# Patient Record
Sex: Female | Born: 1935 | Race: White | Hispanic: No | Smoking: Former smoker
Health system: Southern US, Community
[De-identification: ages and names within clinical notes are randomized; demographics above are authoritative.]

## PROBLEM LIST (undated history)

## (undated) DIAGNOSIS — N390 Urinary tract infection, site not specified: Secondary | ICD-10-CM

## (undated) DIAGNOSIS — N135 Crossing vessel and stricture of ureter without hydronephrosis: Secondary | ICD-10-CM

## (undated) DIAGNOSIS — M1990 Unspecified osteoarthritis, unspecified site: Secondary | ICD-10-CM

## (undated) DIAGNOSIS — I340 Nonrheumatic mitral (valve) insufficiency: Secondary | ICD-10-CM

## (undated) DIAGNOSIS — B029 Zoster without complications: Secondary | ICD-10-CM

## (undated) DIAGNOSIS — K219 Gastro-esophageal reflux disease without esophagitis: Secondary | ICD-10-CM

## (undated) DIAGNOSIS — N9489 Other specified conditions associated with female genital organs and menstrual cycle: Secondary | ICD-10-CM

## (undated) DIAGNOSIS — R0600 Dyspnea, unspecified: Secondary | ICD-10-CM

## (undated) DIAGNOSIS — R112 Nausea with vomiting, unspecified: Secondary | ICD-10-CM

## (undated) DIAGNOSIS — Z789 Other specified health status: Secondary | ICD-10-CM

## (undated) DIAGNOSIS — K449 Diaphragmatic hernia without obstruction or gangrene: Secondary | ICD-10-CM

## (undated) DIAGNOSIS — Z9221 Personal history of antineoplastic chemotherapy: Secondary | ICD-10-CM

## (undated) DIAGNOSIS — Z87442 Personal history of urinary calculi: Secondary | ICD-10-CM

## (undated) DIAGNOSIS — H9190 Unspecified hearing loss, unspecified ear: Secondary | ICD-10-CM

## (undated) DIAGNOSIS — C8330 Diffuse large B-cell lymphoma, unspecified site: Secondary | ICD-10-CM

## (undated) DIAGNOSIS — I509 Heart failure, unspecified: Secondary | ICD-10-CM

## (undated) DIAGNOSIS — L570 Actinic keratosis: Secondary | ICD-10-CM

## (undated) DIAGNOSIS — E039 Hypothyroidism, unspecified: Secondary | ICD-10-CM

## (undated) DIAGNOSIS — C8590 Non-Hodgkin lymphoma, unspecified, unspecified site: Secondary | ICD-10-CM

## (undated) DIAGNOSIS — Z66 Do not resuscitate: Secondary | ICD-10-CM

## (undated) DIAGNOSIS — R55 Syncope and collapse: Secondary | ICD-10-CM

## (undated) DIAGNOSIS — I2510 Atherosclerotic heart disease of native coronary artery without angina pectoris: Secondary | ICD-10-CM

## (undated) DIAGNOSIS — T8859XA Other complications of anesthesia, initial encounter: Secondary | ICD-10-CM

## (undated) DIAGNOSIS — J449 Chronic obstructive pulmonary disease, unspecified: Secondary | ICD-10-CM

## (undated) DIAGNOSIS — N189 Chronic kidney disease, unspecified: Secondary | ICD-10-CM

## (undated) DIAGNOSIS — R251 Tremor, unspecified: Secondary | ICD-10-CM

## (undated) DIAGNOSIS — T4145XA Adverse effect of unspecified anesthetic, initial encounter: Secondary | ICD-10-CM

## (undated) DIAGNOSIS — Z8719 Personal history of other diseases of the digestive system: Secondary | ICD-10-CM

## (undated) DIAGNOSIS — F0390 Unspecified dementia without behavioral disturbance: Secondary | ICD-10-CM

## (undated) DIAGNOSIS — Z9889 Other specified postprocedural states: Secondary | ICD-10-CM

## (undated) HISTORY — DX: Other specified conditions associated with female genital organs and menstrual cycle: N94.89

## (undated) HISTORY — DX: Gastro-esophageal reflux disease without esophagitis: K21.9

## (undated) HISTORY — DX: Urinary tract infection, site not specified: N39.0

## (undated) HISTORY — DX: Unspecified osteoarthritis, unspecified site: M19.90

## (undated) HISTORY — DX: Zoster without complications: B02.9

## (undated) HISTORY — PX: JOINT REPLACEMENT: SHX530

## (undated) HISTORY — DX: Diffuse large B-cell lymphoma, unspecified site: C83.30

## (undated) HISTORY — DX: Actinic keratosis: L57.0

## (undated) HISTORY — PX: DENTAL SURGERY: SHX609

## (undated) HISTORY — PX: TOTAL KNEE ARTHROPLASTY: SHX125

## (undated) HISTORY — PX: SQUAMOUS CELL CARCINOMA EXCISION: SHX2433

---

## 1969-01-28 HISTORY — PX: VAGINAL HYSTERECTOMY: SUR661

## 1993-01-28 HISTORY — PX: OTHER SURGICAL HISTORY: SHX169

## 1995-01-29 HISTORY — PX: CATARACT EXTRACTION: SUR2

## 1995-01-29 HISTORY — PX: SPINE SURGERY: SHX786

## 2007-01-29 DIAGNOSIS — C8330 Diffuse large B-cell lymphoma, unspecified site: Secondary | ICD-10-CM

## 2007-01-29 DIAGNOSIS — C833A Diffuse large b-cell lymphoma, in remission: Secondary | ICD-10-CM

## 2007-01-29 HISTORY — DX: Diffuse large B-cell lymphoma, unspecified site: C83.30

## 2007-01-29 HISTORY — PX: ABDOMINAL SURGERY: SHX537

## 2007-01-29 HISTORY — PX: PORTACATH PLACEMENT: SHX2246

## 2007-01-29 HISTORY — DX: Diffuse large b-cell lymphoma, in remission: C83.3A

## 2007-03-01 ENCOUNTER — Ambulatory Visit: Payer: Self-pay | Admitting: Oncology

## 2007-03-02 HISTORY — PX: OTHER SURGICAL HISTORY: SHX169

## 2007-03-11 ENCOUNTER — Ambulatory Visit: Payer: Self-pay | Admitting: Oncology

## 2007-03-13 ENCOUNTER — Other Ambulatory Visit: Payer: Self-pay

## 2007-03-29 ENCOUNTER — Ambulatory Visit: Payer: Self-pay | Admitting: Oncology

## 2007-04-29 ENCOUNTER — Ambulatory Visit: Payer: Self-pay | Admitting: Oncology

## 2007-05-29 ENCOUNTER — Ambulatory Visit: Payer: Self-pay | Admitting: Oncology

## 2007-06-29 ENCOUNTER — Ambulatory Visit: Payer: Self-pay | Admitting: Oncology

## 2007-07-29 ENCOUNTER — Ambulatory Visit: Payer: Self-pay | Admitting: Oncology

## 2007-07-30 ENCOUNTER — Ambulatory Visit: Payer: Self-pay | Admitting: Oncology

## 2007-08-29 ENCOUNTER — Ambulatory Visit: Payer: Self-pay | Admitting: Oncology

## 2007-09-21 ENCOUNTER — Ambulatory Visit: Payer: Self-pay | Admitting: General Practice

## 2007-09-29 ENCOUNTER — Ambulatory Visit: Payer: Self-pay | Admitting: Oncology

## 2007-10-08 ENCOUNTER — Inpatient Hospital Stay: Payer: Self-pay | Admitting: General Practice

## 2007-10-14 ENCOUNTER — Encounter: Payer: Self-pay | Admitting: Internal Medicine

## 2007-10-26 ENCOUNTER — Encounter: Payer: Self-pay | Admitting: General Practice

## 2007-10-28 ENCOUNTER — Ambulatory Visit: Payer: Self-pay | Admitting: Oncology

## 2007-10-29 ENCOUNTER — Encounter: Payer: Self-pay | Admitting: General Practice

## 2007-10-29 ENCOUNTER — Ambulatory Visit: Payer: Self-pay | Admitting: Oncology

## 2007-11-29 ENCOUNTER — Encounter: Payer: Self-pay | Admitting: General Practice

## 2007-12-30 ENCOUNTER — Encounter: Payer: Self-pay | Admitting: General Practice

## 2008-01-27 ENCOUNTER — Ambulatory Visit: Payer: Self-pay | Admitting: Oncology

## 2008-01-29 ENCOUNTER — Ambulatory Visit: Payer: Self-pay | Admitting: Oncology

## 2008-01-29 ENCOUNTER — Encounter: Payer: Self-pay | Admitting: General Practice

## 2008-02-01 ENCOUNTER — Ambulatory Visit: Payer: Self-pay | Admitting: Oncology

## 2008-02-29 ENCOUNTER — Ambulatory Visit: Payer: Self-pay | Admitting: Oncology

## 2008-03-11 DIAGNOSIS — C8599 Non-Hodgkin lymphoma, unspecified, extranodal and solid organ sites: Secondary | ICD-10-CM | POA: Insufficient documentation

## 2008-03-11 DIAGNOSIS — Z87891 Personal history of nicotine dependence: Secondary | ICD-10-CM | POA: Insufficient documentation

## 2008-04-28 ENCOUNTER — Ambulatory Visit: Payer: Self-pay | Admitting: Oncology

## 2008-05-04 ENCOUNTER — Ambulatory Visit: Payer: Self-pay | Admitting: Oncology

## 2008-05-28 ENCOUNTER — Ambulatory Visit: Payer: Self-pay | Admitting: Oncology

## 2008-08-28 ENCOUNTER — Ambulatory Visit: Payer: Self-pay | Admitting: Oncology

## 2008-09-05 ENCOUNTER — Ambulatory Visit: Payer: Self-pay | Admitting: Oncology

## 2008-09-09 ENCOUNTER — Ambulatory Visit: Payer: Self-pay | Admitting: Oncology

## 2008-09-28 ENCOUNTER — Ambulatory Visit: Payer: Self-pay | Admitting: Oncology

## 2008-10-17 ENCOUNTER — Ambulatory Visit: Payer: Self-pay | Admitting: Family Medicine

## 2008-11-09 DIAGNOSIS — C4492 Squamous cell carcinoma of skin, unspecified: Secondary | ICD-10-CM

## 2008-11-09 HISTORY — DX: Squamous cell carcinoma of skin, unspecified: C44.92

## 2009-01-28 ENCOUNTER — Ambulatory Visit: Payer: Self-pay | Admitting: Oncology

## 2009-01-31 DIAGNOSIS — B029 Zoster without complications: Secondary | ICD-10-CM

## 2009-01-31 HISTORY — DX: Zoster without complications: B02.9

## 2009-02-24 ENCOUNTER — Ambulatory Visit: Payer: Self-pay | Admitting: Oncology

## 2009-02-28 ENCOUNTER — Ambulatory Visit: Payer: Self-pay | Admitting: Oncology

## 2009-05-08 ENCOUNTER — Ambulatory Visit: Payer: Self-pay | Admitting: Family Medicine

## 2009-05-26 ENCOUNTER — Ambulatory Visit: Payer: Self-pay | Admitting: Oncology

## 2009-05-28 ENCOUNTER — Ambulatory Visit: Payer: Self-pay | Admitting: Oncology

## 2009-07-28 ENCOUNTER — Ambulatory Visit: Payer: Self-pay | Admitting: Ophthalmology

## 2009-08-01 ENCOUNTER — Ambulatory Visit: Payer: Self-pay | Admitting: Ophthalmology

## 2009-10-19 ENCOUNTER — Ambulatory Visit: Payer: Self-pay | Admitting: Oncology

## 2009-10-25 ENCOUNTER — Ambulatory Visit: Payer: Self-pay | Admitting: Oncology

## 2009-10-28 ENCOUNTER — Ambulatory Visit: Payer: Self-pay | Admitting: Oncology

## 2009-10-30 ENCOUNTER — Ambulatory Visit: Payer: Self-pay | Admitting: Oncology

## 2009-11-09 LAB — CA 125: CA 125: 11.9 U/mL (ref 0.0–34.0)

## 2009-11-28 ENCOUNTER — Ambulatory Visit: Payer: Self-pay | Admitting: Oncology

## 2010-05-16 ENCOUNTER — Ambulatory Visit: Payer: Self-pay | Admitting: Family Medicine

## 2010-06-05 ENCOUNTER — Ambulatory Visit: Payer: Self-pay | Admitting: Oncology

## 2010-06-29 ENCOUNTER — Ambulatory Visit: Payer: Self-pay | Admitting: Oncology

## 2010-10-08 ENCOUNTER — Ambulatory Visit: Payer: Self-pay | Admitting: Oncology

## 2010-10-11 ENCOUNTER — Ambulatory Visit: Payer: Self-pay | Admitting: Oncology

## 2010-10-29 ENCOUNTER — Ambulatory Visit: Payer: Self-pay | Admitting: Oncology

## 2010-11-13 ENCOUNTER — Ambulatory Visit: Payer: Self-pay | Admitting: Family Medicine

## 2010-12-13 ENCOUNTER — Ambulatory Visit: Payer: Self-pay | Admitting: Family Medicine

## 2011-02-26 ENCOUNTER — Ambulatory Visit: Payer: Self-pay | Admitting: Oncology

## 2011-02-27 LAB — CA 125: CA 125: 12.4 U/mL (ref 0.0–34.0)

## 2011-03-01 ENCOUNTER — Ambulatory Visit: Payer: Self-pay | Admitting: Oncology

## 2011-03-19 DIAGNOSIS — M1990 Unspecified osteoarthritis, unspecified site: Secondary | ICD-10-CM | POA: Insufficient documentation

## 2011-03-29 ENCOUNTER — Ambulatory Visit: Payer: Self-pay | Admitting: Oncology

## 2011-04-11 LAB — CBC CANCER CENTER
Basophil #: 0.1 x10 3/mm (ref 0.0–0.1)
Basophil %: 0.9 %
Eosinophil #: 0.4 x10 3/mm (ref 0.0–0.7)
Eosinophil %: 6.7 %
HCT: 40.7 % (ref 35.0–47.0)
HGB: 13.5 g/dL (ref 12.0–16.0)
Lymphocyte #: 1.3 x10 3/mm (ref 1.0–3.6)
Lymphocyte %: 22.2 %
MCH: 30.9 pg (ref 26.0–34.0)
MCHC: 33.2 g/dL (ref 32.0–36.0)
MCV: 93 fL (ref 80–100)
Monocyte #: 0.5 x10 3/mm (ref 0.0–0.7)
Monocyte %: 8.8 %
Neutrophil #: 3.6 x10 3/mm (ref 1.4–6.5)
Neutrophil %: 61.4 %
Platelet: 298 x10 3/mm (ref 150–440)
RBC: 4.37 10*6/uL (ref 3.80–5.20)
RDW: 12 % (ref 11.5–14.5)
WBC: 5.9 x10 3/mm (ref 3.6–11.0)

## 2011-04-11 LAB — COMPREHENSIVE METABOLIC PANEL
Albumin: 4 g/dL (ref 3.4–5.0)
Alkaline Phosphatase: 83 U/L (ref 50–136)
Anion Gap: 7 (ref 7–16)
BUN: 12 mg/dL (ref 7–18)
Bilirubin,Total: 0.6 mg/dL (ref 0.2–1.0)
Calcium, Total: 8.7 mg/dL (ref 8.5–10.1)
Chloride: 100 mmol/L (ref 98–107)
Co2: 30 mmol/L (ref 21–32)
Creatinine: 0.85 mg/dL (ref 0.60–1.30)
EGFR (African American): >60
EGFR (Non-African Amer.): >60
Glucose: 93 mg/dL (ref 65–99)
Osmolality: 273 (ref 275–301)
Potassium: 4.1 mmol/L (ref 3.5–5.1)
SGOT(AST): 23 U/L (ref 15–37)
SGPT (ALT): 26 U/L
Sodium: 137 mmol/L (ref 136–145)
Total Protein: 7.4 g/dL (ref 6.4–8.2)

## 2011-04-11 LAB — LACTATE DEHYDROGENASE: LDH: 137 U/L (ref 84–246)

## 2011-04-12 LAB — CA 125: CA 125: 13 U/mL (ref 0.0–34.0)

## 2011-04-29 ENCOUNTER — Ambulatory Visit: Payer: Self-pay | Admitting: Oncology

## 2011-09-03 ENCOUNTER — Ambulatory Visit: Payer: Self-pay | Admitting: Gynecologic Oncology

## 2011-10-08 ENCOUNTER — Ambulatory Visit: Payer: Self-pay | Admitting: Oncology

## 2011-10-14 LAB — COMPREHENSIVE METABOLIC PANEL
Albumin: 4.1 g/dL (ref 3.4–5.0)
Alkaline Phosphatase: 80 U/L (ref 50–136)
Anion Gap: 4 — ABNORMAL LOW (ref 7–16)
BUN: 9 mg/dL (ref 7–18)
Bilirubin,Total: 0.5 mg/dL (ref 0.2–1.0)
Calcium, Total: 8.8 mg/dL (ref 8.5–10.1)
Chloride: 99 mmol/L (ref 98–107)
Co2: 33 mmol/L — ABNORMAL HIGH (ref 21–32)
Creatinine: 0.93 mg/dL (ref 0.60–1.30)
EGFR (African American): >60
EGFR (Non-African Amer.): 58 — ABNORMAL LOW
Glucose: 102 mg/dL — ABNORMAL HIGH (ref 65–99)
Osmolality: 271 (ref 275–301)
Potassium: 4.2 mmol/L (ref 3.5–5.1)
SGOT(AST): 19 U/L (ref 15–37)
SGPT (ALT): 23 U/L (ref 12–78)
Sodium: 136 mmol/L (ref 136–145)
Total Protein: 7.3 g/dL (ref 6.4–8.2)

## 2011-10-14 LAB — CBC CANCER CENTER
Basophil #: 0.1 x10 3/mm (ref 0.0–0.1)
Basophil %: 1 %
Eosinophil #: 0.3 x10 3/mm (ref 0.0–0.7)
Eosinophil %: 3.1 %
HCT: 40.5 % (ref 35.0–47.0)
HGB: 13.2 g/dL (ref 12.0–16.0)
Lymphocyte #: 1.3 x10 3/mm (ref 1.0–3.6)
Lymphocyte %: 14 %
MCH: 31.1 pg (ref 26.0–34.0)
MCHC: 32.7 g/dL (ref 32.0–36.0)
MCV: 95 fL (ref 80–100)
Monocyte #: 0.6 x10 3/mm (ref 0.2–0.9)
Monocyte %: 6.8 %
Neutrophil #: 7.1 x10 3/mm — ABNORMAL HIGH (ref 1.4–6.5)
Neutrophil %: 75.1 %
Platelet: 293 x10 3/mm (ref 150–440)
RBC: 4.26 10*6/uL (ref 3.80–5.20)
RDW: 12.6 % (ref 11.5–14.5)
WBC: 9.4 x10 3/mm (ref 3.6–11.0)

## 2011-10-14 LAB — LACTATE DEHYDROGENASE: LDH: 158 U/L (ref 81–234)

## 2011-10-15 ENCOUNTER — Encounter: Payer: Self-pay | Admitting: Unknown Physician Specialty

## 2011-10-15 LAB — CA 125: CA 125: 11.3 U/mL (ref 0.0–34.0)

## 2011-10-29 ENCOUNTER — Encounter: Payer: Self-pay | Admitting: Unknown Physician Specialty

## 2011-10-29 ENCOUNTER — Ambulatory Visit: Payer: Self-pay | Admitting: Oncology

## 2011-11-29 ENCOUNTER — Encounter: Payer: Self-pay | Admitting: Unknown Physician Specialty

## 2012-01-01 ENCOUNTER — Ambulatory Visit: Payer: Self-pay | Admitting: Family Medicine

## 2012-04-10 ENCOUNTER — Ambulatory Visit: Payer: Self-pay | Admitting: Oncology

## 2012-04-13 LAB — CBC CANCER CENTER
Basophil #: 0.1 x10 3/mm (ref 0.0–0.1)
Basophil %: 0.8 %
Eosinophil #: 0.3 x10 3/mm (ref 0.0–0.7)
Eosinophil %: 4.1 %
HCT: 44.4 % (ref 35.0–47.0)
HGB: 15 g/dL (ref 12.0–16.0)
Lymphocyte #: 1.5 x10 3/mm (ref 1.0–3.6)
Lymphocyte %: 19.5 %
MCH: 31.5 pg (ref 26.0–34.0)
MCHC: 33.8 g/dL (ref 32.0–36.0)
MCV: 93 fL (ref 80–100)
Monocyte #: 0.6 x10 3/mm (ref 0.2–0.9)
Monocyte %: 7.8 %
Neutrophil #: 5.2 x10 3/mm (ref 1.4–6.5)
Neutrophil %: 67.8 %
Platelet: 296 x10 3/mm (ref 150–440)
RBC: 4.78 10*6/uL (ref 3.80–5.20)
RDW: 13 % (ref 11.5–14.5)
WBC: 7.7 x10 3/mm (ref 3.6–11.0)

## 2012-04-13 LAB — COMPREHENSIVE METABOLIC PANEL
Albumin: 4.2 g/dL (ref 3.4–5.0)
Alkaline Phosphatase: 82 U/L (ref 50–136)
Anion Gap: 9 (ref 7–16)
BUN: 17 mg/dL (ref 7–18)
Bilirubin,Total: 0.4 mg/dL (ref 0.2–1.0)
Calcium, Total: 9.5 mg/dL (ref 8.5–10.1)
Chloride: 96 mmol/L — ABNORMAL LOW (ref 98–107)
Co2: 32 mmol/L (ref 21–32)
Creatinine: 0.93 mg/dL (ref 0.60–1.30)
EGFR (African American): >60
EGFR (Non-African Amer.): 58 — ABNORMAL LOW
Glucose: 116 mg/dL — ABNORMAL HIGH (ref 65–99)
Osmolality: 276 (ref 275–301)
Potassium: 3.5 mmol/L (ref 3.5–5.1)
SGOT(AST): 13 U/L — ABNORMAL LOW (ref 15–37)
SGPT (ALT): 23 U/L (ref 12–78)
Sodium: 137 mmol/L (ref 136–145)
Total Protein: 7.8 g/dL (ref 6.4–8.2)

## 2012-04-28 ENCOUNTER — Ambulatory Visit: Payer: Self-pay | Admitting: Oncology

## 2012-10-08 ENCOUNTER — Ambulatory Visit: Payer: Self-pay | Admitting: Gynecologic Oncology

## 2012-10-12 ENCOUNTER — Ambulatory Visit: Payer: Self-pay | Admitting: Oncology

## 2012-10-28 ENCOUNTER — Ambulatory Visit: Payer: Self-pay | Admitting: Oncology

## 2013-01-01 ENCOUNTER — Ambulatory Visit: Payer: Self-pay | Admitting: Family Medicine

## 2013-01-09 ENCOUNTER — Observation Stay: Payer: Self-pay | Admitting: Internal Medicine

## 2013-01-09 LAB — URINALYSIS, COMPLETE
Bilirubin,UR: NEGATIVE
Blood: NEGATIVE
Glucose,UR: NEGATIVE mg/dL (ref 0–75)
Hyaline Cast: 2
Nitrite: NEGATIVE
Ph: 7 (ref 4.5–8.0)
Protein: NEGATIVE
RBC,UR: 1 /HPF (ref 0–5)
Specific Gravity: 1.01 (ref 1.003–1.030)
Squamous Epithelial: 1
WBC UR: 49 /HPF (ref 0–5)

## 2013-01-09 LAB — CBC WITH DIFFERENTIAL/PLATELET
Basophil #: 0.1 10*3/uL (ref 0.0–0.1)
Basophil %: 1.1 %
Eosinophil #: 0.1 10*3/uL (ref 0.0–0.7)
Eosinophil %: 0.6 %
HCT: 41.2 % (ref 35.0–47.0)
HGB: 13.9 g/dL (ref 12.0–16.0)
Lymphocyte #: 0.8 10*3/uL — ABNORMAL LOW (ref 1.0–3.6)
Lymphocyte %: 8.4 %
MCH: 31.6 pg (ref 26.0–34.0)
MCHC: 33.8 g/dL (ref 32.0–36.0)
MCV: 94 fL (ref 80–100)
Monocyte #: 0.6 x10 3/mm (ref 0.2–0.9)
Monocyte %: 6 %
Neutrophil #: 8.5 10*3/uL — ABNORMAL HIGH (ref 1.4–6.5)
Neutrophil %: 83.9 %
Platelet: 306 10*3/uL (ref 150–440)
RBC: 4.4 10*6/uL (ref 3.80–5.20)
RDW: 12.8 % (ref 11.5–14.5)
WBC: 10.1 10*3/uL (ref 3.6–11.0)

## 2013-01-09 LAB — COMPREHENSIVE METABOLIC PANEL
Albumin: 4 g/dL (ref 3.4–5.0)
Alkaline Phosphatase: 87 U/L
Anion Gap: 6 — ABNORMAL LOW (ref 7–16)
BUN: 15 mg/dL (ref 7–18)
Bilirubin,Total: 0.2 mg/dL (ref 0.2–1.0)
Calcium, Total: 8.9 mg/dL (ref 8.5–10.1)
Chloride: 97 mmol/L — ABNORMAL LOW (ref 98–107)
Co2: 31 mmol/L (ref 21–32)
Creatinine: 0.75 mg/dL (ref 0.60–1.30)
EGFR (African American): >60
EGFR (Non-African Amer.): >60
Glucose: 143 mg/dL — ABNORMAL HIGH (ref 65–99)
Osmolality: 272 (ref 275–301)
Potassium: 3.4 mmol/L — ABNORMAL LOW (ref 3.5–5.1)
SGOT(AST): 25 U/L (ref 15–37)
SGPT (ALT): 26 U/L (ref 12–78)
Sodium: 134 mmol/L — ABNORMAL LOW (ref 136–145)
Total Protein: 7.3 g/dL (ref 6.4–8.2)

## 2013-01-09 LAB — CK-MB
CK-MB: 2.4 ng/mL (ref 0.5–3.6)
CK-MB: 2.1 ng/mL (ref 0.5–3.6)

## 2013-01-09 LAB — CK TOTAL AND CKMB (NOT AT ARMC)
CK, Total: 76 U/L (ref 21–215)
CK-MB: 1.9 ng/mL (ref 0.5–3.6)

## 2013-01-09 LAB — TROPONIN I
Troponin-I: <0.02 ng/mL
Troponin-I: <0.02 ng/mL
Troponin-I: <0.02 ng/mL

## 2013-01-09 LAB — CK
CK, Total: 90 U/L (ref 21–215)
CK, Total: 120 U/L (ref 21–215)

## 2013-01-09 LAB — LIPASE, BLOOD: Lipase: 141 U/L (ref 73–393)

## 2013-01-10 LAB — CBC WITH DIFFERENTIAL/PLATELET
Basophil #: 0.1 10*3/uL (ref 0.0–0.1)
Basophil %: 0.9 %
Eosinophil #: 0.3 10*3/uL (ref 0.0–0.7)
Eosinophil %: 4.1 %
HCT: 35.9 % (ref 35.0–47.0)
HGB: 12 g/dL (ref 12.0–16.0)
Lymphocyte #: 1.6 10*3/uL (ref 1.0–3.6)
Lymphocyte %: 21.2 %
MCH: 31.3 pg (ref 26.0–34.0)
MCHC: 33.5 g/dL (ref 32.0–36.0)
MCV: 94 fL (ref 80–100)
Monocyte #: 0.7 x10 3/mm (ref 0.2–0.9)
Monocyte %: 8.9 %
Neutrophil #: 4.8 10*3/uL (ref 1.4–6.5)
Neutrophil %: 64.9 %
Platelet: 274 10*3/uL (ref 150–440)
RBC: 3.84 10*6/uL (ref 3.80–5.20)
RDW: 12.8 % (ref 11.5–14.5)
WBC: 7.4 10*3/uL (ref 3.6–11.0)

## 2013-01-10 LAB — BASIC METABOLIC PANEL
Anion Gap: 4 — ABNORMAL LOW (ref 7–16)
BUN: 6 mg/dL — ABNORMAL LOW (ref 7–18)
Calcium, Total: 8.4 mg/dL — ABNORMAL LOW (ref 8.5–10.1)
Chloride: 104 mmol/L (ref 98–107)
Co2: 27 mmol/L (ref 21–32)
Creatinine: 0.69 mg/dL (ref 0.60–1.30)
EGFR (African American): >60
EGFR (Non-African Amer.): >60
Glucose: 106 mg/dL — ABNORMAL HIGH (ref 65–99)
Osmolality: 268 (ref 275–301)
Potassium: 3.8 mmol/L (ref 3.5–5.1)
Sodium: 135 mmol/L — ABNORMAL LOW (ref 136–145)

## 2013-01-10 LAB — TSH: Thyroid Stimulating Horm: 1.81 u[IU]/mL

## 2013-01-11 LAB — URINE CULTURE

## 2013-05-03 ENCOUNTER — Ambulatory Visit: Payer: Self-pay | Admitting: Oncology

## 2013-05-04 LAB — COMPREHENSIVE METABOLIC PANEL
Albumin: 4 g/dL (ref 3.4–5.0)
Alkaline Phosphatase: 77 U/L
Anion Gap: 8 (ref 7–16)
BUN: 10 mg/dL (ref 7–18)
Bilirubin,Total: 0.6 mg/dL (ref 0.2–1.0)
Calcium, Total: 9.1 mg/dL (ref 8.5–10.1)
Chloride: 93 mmol/L — ABNORMAL LOW (ref 98–107)
Co2: 30 mmol/L (ref 21–32)
Creatinine: 0.99 mg/dL (ref 0.60–1.30)
EGFR (African American): >60
EGFR (Non-African Amer.): 53 — ABNORMAL LOW
Glucose: 101 mg/dL — ABNORMAL HIGH (ref 65–99)
Osmolality: 262 (ref 275–301)
Potassium: 3.9 mmol/L (ref 3.5–5.1)
SGOT(AST): 15 U/L (ref 15–37)
SGPT (ALT): 23 U/L (ref 12–78)
Sodium: 131 mmol/L — ABNORMAL LOW (ref 136–145)
Total Protein: 7.4 g/dL (ref 6.4–8.2)

## 2013-05-04 LAB — CBC CANCER CENTER
Basophil #: 0.1 x10 3/mm (ref 0.0–0.1)
Basophil %: 1.3 %
Eosinophil #: 0.4 x10 3/mm (ref 0.0–0.7)
Eosinophil %: 5.2 %
HCT: 41.6 % (ref 35.0–47.0)
HGB: 13.6 g/dL (ref 12.0–16.0)
Lymphocyte #: 1.4 x10 3/mm (ref 1.0–3.6)
Lymphocyte %: 20.1 %
MCH: 30.3 pg (ref 26.0–34.0)
MCHC: 32.6 g/dL (ref 32.0–36.0)
MCV: 93 fL (ref 80–100)
Monocyte #: 0.6 x10 3/mm (ref 0.2–0.9)
Monocyte %: 8.9 %
Neutrophil #: 4.5 x10 3/mm (ref 1.4–6.5)
Neutrophil %: 64.5 %
Platelet: 316 x10 3/mm (ref 150–440)
RBC: 4.48 10*6/uL (ref 3.80–5.20)
RDW: 13.6 % (ref 11.5–14.5)
WBC: 7 x10 3/mm (ref 3.6–11.0)

## 2013-05-28 ENCOUNTER — Ambulatory Visit: Payer: Self-pay | Admitting: Oncology

## 2013-11-24 ENCOUNTER — Ambulatory Visit: Payer: Self-pay | Admitting: Obstetrics and Gynecology

## 2013-11-25 LAB — CA 125: CA 125: 11.1 U/mL (ref 0.0–34.0)

## 2013-11-26 ENCOUNTER — Ambulatory Visit: Payer: Self-pay | Admitting: Obstetrics and Gynecology

## 2013-11-28 ENCOUNTER — Ambulatory Visit: Payer: Self-pay | Admitting: Obstetrics and Gynecology

## 2013-12-18 ENCOUNTER — Ambulatory Visit: Payer: Self-pay | Admitting: Family Medicine

## 2014-01-03 ENCOUNTER — Ambulatory Visit: Payer: Self-pay | Admitting: Family Medicine

## 2014-02-08 DIAGNOSIS — E039 Hypothyroidism, unspecified: Secondary | ICD-10-CM | POA: Diagnosis not present

## 2014-02-08 DIAGNOSIS — R42 Dizziness and giddiness: Secondary | ICD-10-CM | POA: Diagnosis not present

## 2014-02-28 ENCOUNTER — Ambulatory Visit: Payer: Self-pay | Admitting: Family Medicine

## 2014-02-28 DIAGNOSIS — E038 Other specified hypothyroidism: Secondary | ICD-10-CM | POA: Diagnosis not present

## 2014-02-28 DIAGNOSIS — M810 Age-related osteoporosis without current pathological fracture: Secondary | ICD-10-CM | POA: Diagnosis not present

## 2014-03-02 DIAGNOSIS — R300 Dysuria: Secondary | ICD-10-CM | POA: Diagnosis not present

## 2014-03-30 DIAGNOSIS — M810 Age-related osteoporosis without current pathological fracture: Secondary | ICD-10-CM | POA: Diagnosis not present

## 2014-03-30 DIAGNOSIS — J309 Allergic rhinitis, unspecified: Secondary | ICD-10-CM | POA: Diagnosis not present

## 2014-04-05 DIAGNOSIS — J441 Chronic obstructive pulmonary disease with (acute) exacerbation: Secondary | ICD-10-CM | POA: Diagnosis not present

## 2014-04-05 DIAGNOSIS — J0120 Acute ethmoidal sinusitis, unspecified: Secondary | ICD-10-CM | POA: Diagnosis not present

## 2014-04-08 ENCOUNTER — Ambulatory Visit: Payer: Self-pay | Admitting: Family Medicine

## 2014-04-08 DIAGNOSIS — Z8572 Personal history of non-Hodgkin lymphomas: Secondary | ICD-10-CM | POA: Diagnosis not present

## 2014-04-08 DIAGNOSIS — C8590 Non-Hodgkin lymphoma, unspecified, unspecified site: Secondary | ICD-10-CM | POA: Diagnosis not present

## 2014-04-08 DIAGNOSIS — R918 Other nonspecific abnormal finding of lung field: Secondary | ICD-10-CM | POA: Diagnosis not present

## 2014-04-08 DIAGNOSIS — M549 Dorsalgia, unspecified: Secondary | ICD-10-CM | POA: Diagnosis not present

## 2014-04-12 DIAGNOSIS — N390 Urinary tract infection, site not specified: Secondary | ICD-10-CM | POA: Diagnosis not present

## 2014-04-12 DIAGNOSIS — M549 Dorsalgia, unspecified: Secondary | ICD-10-CM | POA: Diagnosis not present

## 2014-05-10 ENCOUNTER — Ambulatory Visit
Admit: 2014-05-10 | Disposition: A | Discharge status: Home / Self Care | Payer: Self-pay | Attending: Oncology | Admitting: Oncology

## 2014-05-10 DIAGNOSIS — Z13820 Encounter for screening for osteoporosis: Secondary | ICD-10-CM | POA: Diagnosis not present

## 2014-05-10 DIAGNOSIS — M8580 Other specified disorders of bone density and structure, unspecified site: Secondary | ICD-10-CM | POA: Diagnosis not present

## 2014-05-10 DIAGNOSIS — I700 Atherosclerosis of aorta: Secondary | ICD-10-CM | POA: Diagnosis not present

## 2014-05-10 DIAGNOSIS — N8329 Other ovarian cysts: Secondary | ICD-10-CM | POA: Diagnosis not present

## 2014-05-10 DIAGNOSIS — Z9221 Personal history of antineoplastic chemotherapy: Secondary | ICD-10-CM | POA: Diagnosis not present

## 2014-05-10 DIAGNOSIS — Z79899 Other long term (current) drug therapy: Secondary | ICD-10-CM | POA: Diagnosis not present

## 2014-05-10 DIAGNOSIS — F419 Anxiety disorder, unspecified: Secondary | ICD-10-CM | POA: Diagnosis not present

## 2014-05-10 DIAGNOSIS — R1909 Other intra-abdominal and pelvic swelling, mass and lump: Secondary | ICD-10-CM | POA: Diagnosis not present

## 2014-05-10 DIAGNOSIS — R531 Weakness: Secondary | ICD-10-CM | POA: Diagnosis not present

## 2014-05-10 DIAGNOSIS — M810 Age-related osteoporosis without current pathological fracture: Secondary | ICD-10-CM | POA: Diagnosis not present

## 2014-05-10 DIAGNOSIS — R918 Other nonspecific abnormal finding of lung field: Secondary | ICD-10-CM | POA: Diagnosis not present

## 2014-05-10 DIAGNOSIS — C8330 Diffuse large B-cell lymphoma, unspecified site: Secondary | ICD-10-CM | POA: Diagnosis not present

## 2014-05-10 DIAGNOSIS — R5383 Other fatigue: Secondary | ICD-10-CM | POA: Diagnosis not present

## 2014-05-10 DIAGNOSIS — E039 Hypothyroidism, unspecified: Secondary | ICD-10-CM | POA: Diagnosis not present

## 2014-05-10 LAB — COMPREHENSIVE METABOLIC PANEL
Albumin: 4.2 g/dL
Alkaline Phosphatase: 57 U/L
Anion Gap: 7 (ref 7–16)
BUN: 17 mg/dL
Bilirubin,Total: 0.5 mg/dL
Calcium, Total: 9 mg/dL
Chloride: 98 mmol/L — ABNORMAL LOW
Co2: 29 mmol/L
Creatinine: 0.77 mg/dL
EGFR (African American): >60
EGFR (Non-African Amer.): >60
Glucose: 109 mg/dL — ABNORMAL HIGH
Potassium: 4 mmol/L
SGOT(AST): 19 U/L
SGPT (ALT): 17 U/L
Sodium: 134 mmol/L — ABNORMAL LOW
Total Protein: 6.8 g/dL

## 2014-05-10 LAB — CBC CANCER CENTER
Basophil #: 0.1 x10 3/mm (ref 0.0–0.1)
Basophil %: 1.4 %
Eosinophil #: 0.4 x10 3/mm (ref 0.0–0.7)
Eosinophil %: 6 %
HCT: 40.8 % (ref 35.0–47.0)
HGB: 13.7 g/dL (ref 12.0–16.0)
Lymphocyte #: 1.2 x10 3/mm (ref 1.0–3.6)
Lymphocyte %: 17.6 %
MCH: 31.1 pg (ref 26.0–34.0)
MCHC: 33.6 g/dL (ref 32.0–36.0)
MCV: 93 fL (ref 80–100)
Monocyte #: 0.5 x10 3/mm (ref 0.2–0.9)
Monocyte %: 7.4 %
Neutrophil #: 4.5 x10 3/mm (ref 1.4–6.5)
Neutrophil %: 67.6 %
Platelet: 307 x10 3/mm (ref 150–440)
RBC: 4.4 10*6/uL (ref 3.80–5.20)
RDW: 12.6 % (ref 11.5–14.5)
WBC: 6.7 x10 3/mm (ref 3.6–11.0)

## 2014-05-10 LAB — LACTATE DEHYDROGENASE: LDH: 139 U/L

## 2014-05-11 DIAGNOSIS — L821 Other seborrheic keratosis: Secondary | ICD-10-CM | POA: Diagnosis not present

## 2014-05-11 DIAGNOSIS — Z85828 Personal history of other malignant neoplasm of skin: Secondary | ICD-10-CM | POA: Diagnosis not present

## 2014-05-11 DIAGNOSIS — L820 Inflamed seborrheic keratosis: Secondary | ICD-10-CM | POA: Diagnosis not present

## 2014-05-11 DIAGNOSIS — L570 Actinic keratosis: Secondary | ICD-10-CM | POA: Diagnosis not present

## 2014-05-11 DIAGNOSIS — D229 Melanocytic nevi, unspecified: Secondary | ICD-10-CM | POA: Diagnosis not present

## 2014-05-13 ENCOUNTER — Other Ambulatory Visit: Payer: Self-pay | Admitting: Obstetrics and Gynecology

## 2014-05-13 DIAGNOSIS — Z8572 Personal history of non-Hodgkin lymphomas: Secondary | ICD-10-CM

## 2014-05-18 DIAGNOSIS — N3001 Acute cystitis with hematuria: Secondary | ICD-10-CM | POA: Diagnosis not present

## 2014-05-20 NOTE — Discharge Summary (Signed)
PATIENT NAME:  Monica Singh, Monica Singh MR#:  [id number] DATE OF BIRTH:  Aug 10, 1928  DATE OF ADMISSION:  01/09/2013 DATE OF DISCHARGE:  01/10/2013  PRIMARY CARE PHYSICIAN:  Dr. Venia Minks  FINAL DIAGNOSES: 1.  Syncope, likely vasovagal.  2.  Vertigo.  3.  Urinary tract infection.  4.  Gastroesophageal reflux disease.  5.  History of non-Hodgkin's lymphoma,   MEDICATIONS ON DISCHARGE: Include her allergy relief pill 4 mg 1 tablet at bedtime, omeprazole 40 mg daily, meclizine 12.5 mg 3 times a day as needed for vertigo, cephalexin 250 mg every 8 hours for one more day.   DIET: Regular diet, regular consistency.   ACTIVITY: As tolerated.   FOLLOW-UP: In 1 to 2 weeks with Dr. Venia Minks.   HOSPITAL COURSE: The patient was admitted as an observation 01/09/2013, discharged 01/10/2013. Came in with a syncopal episode and then once she came through she was very dizzy, and nauseous and vomited. The patient was on the floor for 2 to 3 hours prior to coming in.  She was admitted with syncope, probably vasovagal, with dehydration and she was given antibiotics for a urinary tract infection. The patient also had vertigo symptoms.   LABORATORY AND RADIOLOGICAL DATA DURING THE HOSPITAL COURSE: Included a D-dimer that was negative. Troponin negative. Lipase 141. Glucose 143, BUN 15, creatinine 0.75, sodium 134, potassium 3.4, chloride 97, CO2 31, calcium 8.9. Liver function tests normal range. White blood cell count 10.1, hemoglobin and hematocrit 13.9 and 41.2, platelet count of 306.   Chest x-ray: Left basilar airspace opacity likely represents atelectasis.   CT scan of the head: Left frontoparietal scalp soft tissue contusion, atrophy with chronic microvascular ischemic disease.   Urinalysis: 2+ leukocyte esterase. Urine culture greater than 100,000 strep B agalactiae.   Next two troponins negative. TSH 1.81.   Echocardiogram: Ejection fraction 60% to 65%, borderline left ventricular hypertrophy, impaired  relaxation.    Creatinine upon discharge 0.69 creatinine with BUN hemoglobin 12.   HOSPITAL COURSE PER PROBLEM LIST:  1.  For the patient's of syncope, the patient felt well before she went into the kitchen and ended up on the floor. When she did come through, she vomited and also felt the room was spinning I do think this was probably a vasovagal syncope. Work-up in the hospital was negative. The patient was hydrated up and felt better the next day.  2.  Vertigo. This could be secondary to the syncope and hitting her head versus benign positional vertigo. I did give the patient a script for meclizine just in case this recurs. Tympanic membrane looks well. This is not inner ear infection. The patient walked around with the nursing staff around the nursing station and was stable. The patient will be discharged home with her daughter and stay with her for the next few days.  3.  Urinary tract infection. The patient was given two doses of Rocephin while here in the hospital. Another day of cephalexin should treat urinary tract infection completely.  4.  Gastroesophageal reflux disease, on omeprazole.  5.  History of non-Hodgkin's lymphoma in remission.   TIME SPENT ON DISCHARGE: 35 minutes.   The patient's drop in hemoglobin secondary to dilution with IV fluids; the patient was dehydrated on presentation. Her BUN and creatinine had improved also with hydration   ____________________________ Tana Conch. Leslye Peer, MD rjw:cc D: 01/10/2013 15:42:49 ET T: 01/10/2013 19:39:57 ET JOB#: [id number]  cc: Tana Conch. Leslye Peer, MD, <Dictator> Jerrell Belfast, MD [name]  SIGNED 01/15/2013 16:57

## 2014-05-20 NOTE — H&P (Signed)
PATIENT NAME:  Monica Singh, Monica Singh MR#:  [id number] DATE OF BIRTH:  1928-06-29  DATE OF ADMISSION:  01/09/2013  PRIMARY CARE PHYSICIAN: Jerrell Belfast, MD  CHIEF COMPLAINT: Passed out.   HISTORY OF PRESENT ILLNESS: The patient is an 81 year old pleasant Caucasian female with past medical history of osteoporosis, non-Hodgkin's lymphoma, following with Dr. Forest Gleason as an outpatient regarding non-Hodgkin's lymphoma. Her last surveillance was during 04/13/2012. She was brought into the ER after she had a syncopal episode. The patient was in her usual state of health until last night. At around 12:30 a.m. today, the patient started having some cramps in her legs. She went into the kitchen to fix a sandwich for herself, and while coming back, probably she passed out. The patient was found on the kitchen floor by her son at around 3:00 a.m. They had noticed vomit around her. We are thinking the patient was on the floor approximately for 2 or 3 hours. The patient was brought into the ER by her 2 daughters. One daughter lives in [place], and the other daughter lives in [name], who is visiting for a family get-together. As the patient hit her head, she had small laceration on the left side of the scalp. CT head is negative. The patient denies any chest pain or shortness of breath. She denies feeling dizzy prior to passing out, but complaining of dizziness during my examination in the ER. The patient denies any chest pain, shortness of breath, palpitations, abdominal pain. She admitted that she vomited 2 times so far. Denies any back pain, urinary frequency, burning with micturition. Denies any cough or cold. No similar complaints in the past. The patient is pretty active and energetic, though she is 81 years old, as reported by the daughters. The patient was awake and alert and answering all questions during my examination.   PAST MEDICAL HISTORY: Non-Hodgkin's lymphoma, osteoporosis.   PAST SURGICAL  HISTORY: Lumbar spine surgery, left knee replacement, surgical biopsy during the diagnosis of non-Hodgkin's lymphoma, cataract repair, eye surgery.   ALLERGIES: SHE IS ALLERGIC TO SULFA AND MILK.   PSYCHOSOCIAL HISTORY: Lives alone. Quit smoking. Drinks 3 glasses of wine every day, last drink was yesterday. Denies any illicit drug usage.   FAMILY HISTORY: Dad deceased at age 23 with ALS. Mom had history of hypertension, deceased at the age of 63. She has history of dementia.   HOME MEDICATIONS: Omeprazole 40 mg once daily, Allergy Relief 4 mg once daily.   REVIEW OF SYSTEMS:  CONSTITUTIONAL: Denies any fever, fatigue.  EYES: Denies blurry vision, double vision.  ENT: Denies epistaxis, discharge.  RESPIRATION: Denies cough, COPD.  CARDIOVASCULAR: Denies chest pain, palpitations. Had syncope.  GASTROINTESTINAL: Had nausea and vomiting. Denies abdominal pain, hematemesis, melena.  GENITOURINARY: No dysuria, hematuria, renal calculi. GYNECOLOGIC AND BREASTS: Denies breast mass or vaginal discharge.  ENDOCRINE: Denies polyuria, nocturia, thyroid problems.  HEMATOLOGIC AND LYMPHATIC: Has a history of non-Hodgkin's lymphoma. Denies any anemia, easy bruising or bleeding.  INTEGUMENTARY: No acne, rash, lesions.  MUSCULOSKELETAL: No joint pain in the neck, but has chronic low back pain. Denies gout.  NEUROLOGIC: No vertigo or ataxia.  PSYCHIATRIC: No ADD, OCD.   PHYSICAL EXAMINATION:  VITAL SIGNS: Temperature 97.8, pulse 100, respirations 18, blood pressure 139/73, pulse oximetry 100% on room air.  GENERAL APPEARANCE: Not under acute distress. Moderately built and nourished.  HEENT: Normocephalic. Pupils are equally reactive to light and accommodation. No scleral icterus. No conjunctival injection. Extraocular movements are intact. Left scalp  contusion is present. No sinus tenderness. Dry mucous membranes. Oral cavity is intact.  NECK: Supple. No JVD. No thyromegaly. Range of motion is slightly  limited as she sustained a fall, but she could touch her chest with chin.  LUNGS: Clear to auscultation bilaterally. No accessory muscle usage. No anterior chest wall tenderness on palpation.  CARDIAC: S1, S2 normal. Regular rate and rhythm. No murmurs.  GASTROINTESTINAL: Soft. Bowel sounds are positive in all 4 quadrants. Nontender, nondistended. No hepatosplenomegaly. No masses felt.  NEUROLOGIC: Awake, alert, oriented x3. Cranial nerves II through XII are grossly intact. Motor and sensory are intact. Reflexes are 2+.  EXTREMITIES: No edema. No cyanosis. No clubbing.  SKIN: Warm to touch. Decreased turgor. Dry in nature. No rashes. No lesions.  MUSCULOSKELETAL: No joint effusion, tenderness or erythema.  PSYCHIATRIC: Normal mood and affect.   LABORATORY AND IMAGING STUDIES: LFTs normal. First set of cardiac enzymes are normal. CBC normal. D-dimer is normal at less than 0.22. Urinalysis: Yellow in color, hazy in appearance, ketones trace, blood negative, glucose negative, nitrite negative, 2 + leukocyte esterase, hyaline cast 2 per low-power field. Glucose 143, BUN 15, creatinine 0.75, sodium 134, potassium 3.4, chloride 97, CO2 31, GFR greater than 60, anion gap 6, serum osmolality 272, calcium 8.9, lipase 141. CT of the head: Left frontoparietal scalp soft tissue laceration, contusion with no acute intracranial process. Atrophy with chronic microvascular ischemic disease. Chest x-ray, portable, single view has revealed mild left basilar airspace opacity likely reflecting atelectasis. Lungs otherwise grossly clear. A 12-lead EKG has revealed sinus tachycardia with left atrial enlargement. No acute ST-T wave changes.   ASSESSMENT AND PLAN: An 81 year old Caucasian female brought into the ER after she sustained a syncopal episode. Will be admitted with following assessment and plan:   1. Syncope, probably vasovagal from vomiting. The patient's CT head is negative. Will admit her to telemetry bed. Will  cycle cardiac biomarkers, provide IV fluids for dehydration, check orthostatics. Also will obtain 2-D echocardiogram. If necessary, rounding physician can add carotid Dopplers. As her syncopal episode seemed to be vasovagal, I am not considering carotid Dopplers at this time.  2. Dehydration from nausea and vomiting. Will provide IV fluids. Will monitor CK to rule out rhabdomyolysis as the patient fell and stayed on the floor at least for 2 to 3 hours.  3. Acute cystitis. Urine cultures are ordered. Will provide her IV Rocephin.  4. Non-Hodgkin's lymphoma. Follow up with hematology/oncology as scheduled on outpatient basis.  5. Chronic history of osteoporosis, not on any medications.  6. Will provide gastrointestinal and deep vein thrombosis prophylaxis.   CODE STATUS: She is full code. Two daughters are medical power of attorney.   Diagnosis and plan of care was discussed in detail with the patient. She is aware of the plan.   TOTAL TIME SPENT ON ADMISSION: 45 minutes.   ____________________________ Nicholes Mango, MD ag:lb D: 01/09/2013 07:22:54 ET T: 01/09/2013 07:44:00 ET JOB#: [id number]  cc: Nicholes Mango, MD, <Dictator> Jerrell Belfast, MD Nicholes Mango MD ELECTRONICALLY SIGNED 01/12/2013 0:06

## 2014-06-03 ENCOUNTER — Other Ambulatory Visit: Payer: Self-pay | Admitting: Family Medicine

## 2014-06-03 ENCOUNTER — Encounter: Payer: Self-pay | Admitting: Family Medicine

## 2014-06-03 DIAGNOSIS — Z8572 Personal history of non-Hodgkin lymphomas: Secondary | ICD-10-CM

## 2014-06-03 DIAGNOSIS — N9489 Other specified conditions associated with female genital organs and menstrual cycle: Secondary | ICD-10-CM

## 2014-06-03 HISTORY — DX: Other specified conditions associated with female genital organs and menstrual cycle: N94.89

## 2014-06-06 ENCOUNTER — Ambulatory Visit: Admission: RE | Admit: 2014-06-06 | Payer: Medicare Other | Source: Ambulatory Visit

## 2014-06-06 ENCOUNTER — Ambulatory Visit: Payer: Medicare Other

## 2014-06-08 ENCOUNTER — Ambulatory Visit: Payer: Self-pay

## 2014-06-08 ENCOUNTER — Other Ambulatory Visit: Payer: Self-pay

## 2014-06-13 ENCOUNTER — Ambulatory Visit
Admission: RE | Admit: 2014-06-13 | Discharge: 2014-06-13 | Disposition: A | Discharge status: Home / Self Care | Payer: Medicare Other | Source: Ambulatory Visit | Attending: Obstetrics and Gynecology | Admitting: Obstetrics and Gynecology

## 2014-06-13 DIAGNOSIS — N949 Unspecified condition associated with female genital organs and menstrual cycle: Secondary | ICD-10-CM | POA: Diagnosis not present

## 2014-06-13 DIAGNOSIS — N838 Other noninflammatory disorders of ovary, fallopian tube and broad ligament: Secondary | ICD-10-CM | POA: Diagnosis not present

## 2014-06-13 DIAGNOSIS — Z90710 Acquired absence of both cervix and uterus: Secondary | ICD-10-CM | POA: Diagnosis not present

## 2014-06-14 ENCOUNTER — Telehealth: Payer: Self-pay | Admitting: *Deleted

## 2014-06-14 DIAGNOSIS — N3001 Acute cystitis with hematuria: Secondary | ICD-10-CM | POA: Diagnosis not present

## 2014-06-14 NOTE — Telephone Encounter (Signed)
Patient volunteering in cancer center this morning. Spoke to patient face to face regarding her u/s results. Pt appreciated the information. She will keep her apt next week with Dr. Theora Gianotti.

## 2014-06-14 NOTE — Telephone Encounter (Signed)
-----   Message from Evlyn Kanner, NP sent at 06/13/2014  4:07 PM EDT -----   ----- Message -----    From: Rad Results In Interface    Sent: 06/13/2014   3:43 PM      To: Evlyn Kanner, NP

## 2014-06-14 NOTE — Telephone Encounter (Signed)
Left msg for patient "call cancer center back to receive for review of u/s results.  Pt has an apt next week to discuss. I know patient always wants to know results ahead of apt.

## 2014-06-21 ENCOUNTER — Other Ambulatory Visit: Payer: Self-pay | Admitting: *Deleted

## 2014-06-21 DIAGNOSIS — Z8579 Personal history of other malignant neoplasms of lymphoid, hematopoietic and related tissues: Secondary | ICD-10-CM

## 2014-06-21 DIAGNOSIS — N83202 Unspecified ovarian cyst, left side: Secondary | ICD-10-CM

## 2014-06-22 ENCOUNTER — Inpatient Hospital Stay: Payer: Medicare Other | Attending: Obstetrics and Gynecology | Admitting: Obstetrics and Gynecology

## 2014-06-22 ENCOUNTER — Inpatient Hospital Stay: Payer: Medicare Other

## 2014-06-22 ENCOUNTER — Encounter: Payer: Self-pay | Admitting: *Deleted

## 2014-06-22 ENCOUNTER — Other Ambulatory Visit: Payer: Self-pay | Admitting: *Deleted

## 2014-06-22 ENCOUNTER — Encounter (INDEPENDENT_AMBULATORY_CARE_PROVIDER_SITE_OTHER): Payer: Self-pay

## 2014-06-22 VITALS — BP 131/78 | HR 95 | Temp 98.5°F | Resp 18 | Ht 63.0 in | Wt 154.8 lb

## 2014-06-22 DIAGNOSIS — Z87891 Personal history of nicotine dependence: Secondary | ICD-10-CM | POA: Insufficient documentation

## 2014-06-22 DIAGNOSIS — N390 Urinary tract infection, site not specified: Secondary | ICD-10-CM | POA: Insufficient documentation

## 2014-06-22 DIAGNOSIS — Z90710 Acquired absence of both cervix and uterus: Secondary | ICD-10-CM | POA: Diagnosis not present

## 2014-06-22 DIAGNOSIS — N9489 Other specified conditions associated with female genital organs and menstrual cycle: Secondary | ICD-10-CM

## 2014-06-22 DIAGNOSIS — K219 Gastro-esophageal reflux disease without esophagitis: Secondary | ICD-10-CM | POA: Diagnosis not present

## 2014-06-22 DIAGNOSIS — Z8572 Personal history of non-Hodgkin lymphomas: Secondary | ICD-10-CM | POA: Diagnosis not present

## 2014-06-22 DIAGNOSIS — K5900 Constipation, unspecified: Secondary | ICD-10-CM | POA: Diagnosis not present

## 2014-06-22 DIAGNOSIS — C8590 Non-Hodgkin lymphoma, unspecified, unspecified site: Secondary | ICD-10-CM

## 2014-06-22 DIAGNOSIS — I10 Essential (primary) hypertension: Secondary | ICD-10-CM | POA: Insufficient documentation

## 2014-06-22 DIAGNOSIS — R1909 Other intra-abdominal and pelvic swelling, mass and lump: Secondary | ICD-10-CM

## 2014-06-22 DIAGNOSIS — N8320 Unspecified ovarian cysts: Secondary | ICD-10-CM | POA: Diagnosis not present

## 2014-06-22 DIAGNOSIS — N83202 Unspecified ovarian cyst, left side: Secondary | ICD-10-CM

## 2014-06-22 LAB — COMPREHENSIVE METABOLIC PANEL
ALT: 19 U/L (ref 14–54)
AST: 19 U/L (ref 15–41)
Albumin: 4.4 g/dL (ref 3.5–5.0)
Alkaline Phosphatase: 56 U/L (ref 38–126)
Anion gap: 5 (ref 5–15)
BUN: 14 mg/dL (ref 6–20)
CO2: 27 mmol/L (ref 22–32)
Calcium: 8.6 mg/dL — ABNORMAL LOW (ref 8.9–10.3)
Chloride: 98 mmol/L — ABNORMAL LOW (ref 101–111)
Creatinine, Ser: 0.91 mg/dL (ref 0.44–1.00)
GFR calc Af Amer: >60 mL/min (ref >60)
GFR calc non Af Amer: 57 mL/min — ABNORMAL LOW (ref >60)
Glucose, Bld: 107 mg/dL — ABNORMAL HIGH (ref 65–99)
Potassium: 3.7 mmol/L (ref 3.5–5.1)
Sodium: 130 mmol/L — ABNORMAL LOW (ref 135–145)
Total Bilirubin: 0.6 mg/dL (ref 0.3–1.2)
Total Protein: 6.9 g/dL (ref 6.5–8.1)

## 2014-06-22 LAB — CBC WITH DIFFERENTIAL/PLATELET
Basophils Absolute: 0.1 10*3/uL (ref 0–0.1)
Basophils Relative: 1 %
Eosinophils Absolute: 0.3 10*3/uL (ref 0–0.7)
Eosinophils Relative: 4 %
HCT: 40.4 % (ref 35.0–47.0)
Hemoglobin: 13.6 g/dL (ref 12.0–16.0)
Lymphocytes Relative: 18 %
Lymphs Abs: 1.4 10*3/uL (ref 1.0–3.6)
MCH: 30.8 pg (ref 26.0–34.0)
MCHC: 33.6 g/dL (ref 32.0–36.0)
MCV: 91.8 fL (ref 80.0–100.0)
Monocytes Absolute: 0.7 10*3/uL (ref 0.2–0.9)
Monocytes Relative: 9 %
Neutro Abs: 5.1 10*3/uL (ref 1.4–6.5)
Neutrophils Relative %: 68 %
Platelets: 281 10*3/uL (ref 150–440)
RBC: 4.4 MIL/uL (ref 3.80–5.20)
RDW: 13.1 % (ref 11.5–14.5)
WBC: 7.6 10*3/uL (ref 3.6–11.0)

## 2014-06-22 LAB — LACTATE DEHYDROGENASE: LDH: 134 U/L (ref 98–192)

## 2014-06-22 NOTE — Patient Instructions (Signed)
Constipation  Constipation is when a person has fewer than three bowel movements a week, has difficulty having a bowel movement, or has stools that are dry, hard, or larger than normal. As people grow older, constipation is more common. If you try to fix constipation with medicines that make you have a bowel movement (laxatives), the problem may get worse. Long-term laxative use may cause the muscles of the colon to become weak. A low-fiber diet, not taking in enough fluids, and taking certain medicines may make constipation worse.   CAUSES   · Certain medicines, such as antidepressants, pain medicine, iron supplements, antacids, and water pills.    · Certain diseases, such as diabetes, irritable bowel syndrome (IBS), thyroid disease, or depression.    · Not drinking enough water.    · Not eating enough fiber-rich foods.    · Stress or travel.    · Lack of physical activity or exercise.    · Ignoring the urge to have a bowel movement.    · Using laxatives too much.    SIGNS AND SYMPTOMS   · Having fewer than three bowel movements a week.    · Straining to have a bowel movement.    · Having stools that are hard, dry, or larger than normal.    · Feeling full or bloated.    · Pain in the lower abdomen.    · Not feeling relief after having a bowel movement.    DIAGNOSIS   Your health care provider will take a medical history and perform a physical exam. Further testing may be done for severe constipation. Some tests may include:  · A barium enema X-ray to examine your rectum, colon, and, sometimes, your small intestine.    · A sigmoidoscopy to examine your lower colon.    · A colonoscopy to examine your entire colon.  TREATMENT   Treatment will depend on the severity of your constipation and what is causing it. Some dietary treatments include drinking more fluids and eating more fiber-rich foods. Lifestyle treatments may include regular exercise. If these diet and lifestyle recommendations do not help, your health care  provider may recommend taking over-the-counter laxative medicines to help you have bowel movements. Prescription medicines may be prescribed if over-the-counter medicines do not work.   HOME CARE INSTRUCTIONS   · Eat foods that have a lot of fiber, such as fruits, vegetables, whole grains, and beans.  · Limit foods high in fat and processed sugars, such as french fries, hamburgers, cookies, candies, and soda.    · A fiber supplement may be added to your diet if you cannot get enough fiber from foods.    · Drink enough fluids to keep your urine clear or pale yellow.    · Exercise regularly or as directed by your health care provider.    · Go to the restroom when you have the urge to go. Do not hold it.    · Only take over-the-counter or prescription medicines as directed by your health care provider. Do not take other medicines for constipation without talking to your health care provider first.    SEEK IMMEDIATE MEDICAL CARE IF:   · You have bright red blood in your stool.    · Your constipation lasts for more than 4 days or gets worse.    · You have abdominal or rectal pain.    · You have thin, pencil-like stools.    · You have unexplained weight loss.  MAKE SURE YOU:   · Understand these instructions.  · Will watch your condition.  · Will get help right away if you are not   you have with your health care provider.   ULTRASOUND RESULTS CLINICAL DATA: Follow-up of left adnexal mass; history of previous vaginal hysterectomy; a transabdominal study only was performed at the patient's request.  EXAM: TRANSABDOMINAL ULTRASOUND OF PELVIS  TECHNIQUE: Transabdominal ultrasound examination of the pelvis was  performed including evaluation of the uterus, ovaries, adnexal regions, and pelvic cul-de-sac.  COMPARISON: Pelvic ultrasound dated November 26, 2013 and October 08, 2012  FINDINGS: The uterus is surgically absent.  Right ovary  The right ovary could not be demonstrated.  Left ovary  Measurements: 4.5 x 3.9 x 3.6 cm. There is an anechoic structure measuring 3.6 x 3.6 x 3.4 cm which is slightly smaller than on the previous study. Previous dimensions of this anechoic structure were 4.4 x 3.4 x 3.4 cm.  Other findings: There is no free pelvic fluid.  IMPRESSION: 1. Slight interval decrease in the size of the hypoechoic slightly irregularly marginated left ovarian cystic structure. 2. The right ovary could not be demonstrated. The uterus is surgically absent.   Electronically Signed  By: David Martinique M.D.  On: 06/13/2014 15:40

## 2014-06-22 NOTE — Progress Notes (Signed)
Gynecologic Oncology Interval Note  Referring MD: Dr. Forest Gleason  Chief Complaint: Ovarian cyst surveillance.   Subjective:  Monica Singh is a 81 y.o. woman.  Patient presents today for continued surveillance for history of ovarian cyst. She is doing well today. Her only complaints are constipation due to an antibiotic that she's been taking for a urinary tract infection.  Her ultrasound 06/13/2014 compared to 11/26/2013 and 10/08/2012  was reasssuring as noted below. Her CA125 is pending.   Measurements: 4.5 x 3.9 x 3.6 cm. There is an anechoic structure measuring 3.6 x 3.6 x 3.4 cm which is slightly smaller than on the previous study. Previous dimensions of this anechoic structure were 4.4 x 3.4 x 3.4 cm  IMPRESSION: 1. Slight interval decrease in the size of the hypoechoic slightly irregularly marginated left ovarian cystic structure. 2. The right ovary could not be demonstrated. The uterus is surgically absent.  Lab Results  Component Value Date   CA125 11.1 11/24/2013    Oncology Treatment History:  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 She had been seen by Dr. Sabra Heck for several years for an adnexal mass has been present since 2009. She is status post hysterectomy. Her history is as follows:  CA-125 on 01/2011 was 12.4  03/01/2011 she had an ultrasound compared to 2011. Within the left neck for region there is a cystic mass measuring 2.1 x 3.98 x 3.2 cm. What appeared to be the ovary included a cyst measuring 4.3 x 4.32 x 3.32 cm.   CA-125 on 03/2011 was 13.0  Repeat ultrasound 09/03/2011 the left ovary measured 4.2 x 3.1 x 3.4 cm. The cystic mass measured 3.2 x 2.5 x 2.9 cm. The cystic mass was relatively anechoic without a dominant area of nodularity. There may be mild thickening of the wall but similar to prior.   CA-125 on 09/2011 was 11.3.   Ultrasound on 10/08/2012 on the left ovary  demonstrated and measured 4.5 x 4.1 x 3.5 cm and contained a 4 x 3.2 x 3.7 cm cystic area there is persistent solid and cystic adnexal process presumed to reflect ovary and associated ovarian cyst.   Problem List: Patient Active Problem List   Diagnosis Date Noted  . Ovarian cyst 06/22/2014  . Adnexal mass 06/03/2014    Past Medical History: Past Medical History  Diagnosis Date  . Adnexal mass 06/03/2014    since 2009  . Diffuse large cell lymphoma in remission     NON-HODGKINS-stage 3, cd 20 positive; status post 6 cycles of R-CHOP  . HTN (hypertension)   . GERD (gastroesophageal reflux disease)     Past Surgical History: Past Surgical History  Procedure Laterality Date  . Portacath placement  2009  . Cervical neck fusion  1995  . Cataract extraction  1997    right eye  . Laparotomy with biopsy  03/02/2007  . Vaginal hysterectomy  1971  . Spine surgery  1997    Family History: Family History  Problem Relation Age of Onset  . Breast cancer Sister   . Leukemia Grandchild     granddaughter    Social History: History   Social History  . Marital Status: Widowed    Spouse Name: N/A  . Number of Children: N/A  . Years of Education: N/A   Occupational History  . Not on file.   Social History Main Topics  . Smoking status: Former  Smoker -- 0.25 packs/day    Types: Cigarettes    Quit date: 06/21/1989  . Smokeless tobacco: Former Systems developer    Quit date: 01/28/1989  . Alcohol Use: 0.0 oz/week    0 Standard drinks or equivalent per week     Comment: occassional wine  . Drug Use: No  . Sexual Activity: Not on file   Other Topics Concern  . Not on file   Social History Narrative    Allergies: Allergies  Allergen Reactions  . Milk-Related Compounds Diarrhea and Nausea And Vomiting  . Sulfa Antibiotics Hives and Itching    Current Medications: Current Outpatient Prescriptions  Medication Sig Dispense Refill  . docusate sodium (COLACE) 100 MG capsule Take 100 mg  by mouth 1 day or 1 dose.    . brompheniramine-pseudoephedrine (DIMETAPP) 1-15 MG/5ML ELIX Take by mouth 2 (two) times daily as needed for allergies.     No current facility-administered medications for this visit.      Review of Systems A comprehensive review of systems was negative. Except for what was reported in HPI.   Objective:  BP 131/78 mmHg  Pulse 95  Temp(Src) 98.5 F (36.9 C) (Tympanic)  Resp 18  Ht 5\' 3"  (1.6 m)  Wt 154 lb 12.2 oz (70.2 kg)  BMI 27.42 kg/m2  SpO2 94%   ECOG Performance Status: 0 - Asymptomatic  General appearance: alert, cooperative and appears stated age HEENT: ATNC Abdomen:no palpable masses, no hernias, soft, nontender, nondistended, no evidence of ascites.  Extremities: no lower extremity edema Neurological exam reveals alert, oriented, normal speech, no focal findings or movement disorder noted  Pelvic: Vulva: normal appearing vulva with no masses, tenderness or lesions; Vagina: normal vagina; Adnexa: normal adnexa in size, nontender and no masses; Uterus: surgically absent, vaginal cuff well healed; Cervix: absent; Rectal: no  Lab Review CA-125 pending. Lab Results  Component Value Date   CA125 11.1 11/24/2013   CA125 11.3 10/14/2011   CA125 13.0 04/11/2011    Assessment:  Monica Singh is a 81 y.o. female with an ovarian cyst, asymptomatic and decreased in size. Constipation.    Plan:   Problem List Items Addressed This Visit      Genitourinary   Ovarian cyst - Primary   Relevant Orders   US Pelvis Complete   US Transvaginal Non-OB       Reassurance given. Follow up on CA-125. Repeat pelvic ultrasound in 6 months. If normal then we will reevaluate in one year. If her CA-125 is normal I do not think we need to repeat unless cyst changes in character or enlarges on imaging.  Suggested return to clinic in  6 months.  Gillis Ends, MD  CC:  Margarita Rana, MD [address] [place], [place]  [id number] [phone]

## 2014-06-23 LAB — CA 125: CA 125: 11.7 U/mL (ref 0.0–38.1)

## 2014-06-24 ENCOUNTER — Encounter: Payer: Self-pay | Admitting: Obstetrics and Gynecology

## 2014-06-28 DIAGNOSIS — N3001 Acute cystitis with hematuria: Secondary | ICD-10-CM | POA: Diagnosis not present

## 2014-06-28 DIAGNOSIS — E871 Hypo-osmolality and hyponatremia: Secondary | ICD-10-CM | POA: Diagnosis not present

## 2014-07-01 ENCOUNTER — Encounter: Payer: Self-pay | Admitting: Obstetrics and Gynecology

## 2014-08-25 ENCOUNTER — Ambulatory Visit (INDEPENDENT_AMBULATORY_CARE_PROVIDER_SITE_OTHER): Payer: Medicare Other | Admitting: Family Medicine

## 2014-08-25 ENCOUNTER — Encounter: Payer: Self-pay | Admitting: Family Medicine

## 2014-08-25 VITALS — BP 142/60 | HR 78 | Temp 98.1°F | Resp 12 | Wt 157.0 lb

## 2014-08-25 DIAGNOSIS — R0989 Other specified symptoms and signs involving the circulatory and respiratory systems: Secondary | ICD-10-CM | POA: Insufficient documentation

## 2014-08-25 DIAGNOSIS — M1710 Unilateral primary osteoarthritis, unspecified knee: Secondary | ICD-10-CM | POA: Insufficient documentation

## 2014-08-25 DIAGNOSIS — J309 Allergic rhinitis, unspecified: Secondary | ICD-10-CM | POA: Insufficient documentation

## 2014-08-25 DIAGNOSIS — J441 Chronic obstructive pulmonary disease with (acute) exacerbation: Secondary | ICD-10-CM | POA: Insufficient documentation

## 2014-08-25 DIAGNOSIS — K635 Polyp of colon: Secondary | ICD-10-CM | POA: Insufficient documentation

## 2014-08-25 DIAGNOSIS — M1711 Unilateral primary osteoarthritis, right knee: Secondary | ICD-10-CM | POA: Insufficient documentation

## 2014-08-25 DIAGNOSIS — E038 Other specified hypothyroidism: Secondary | ICD-10-CM | POA: Insufficient documentation

## 2014-08-25 DIAGNOSIS — M5030 Other cervical disc degeneration, unspecified cervical region: Secondary | ICD-10-CM | POA: Insufficient documentation

## 2014-08-25 DIAGNOSIS — E039 Hypothyroidism, unspecified: Secondary | ICD-10-CM | POA: Insufficient documentation

## 2014-08-25 DIAGNOSIS — E871 Hypo-osmolality and hyponatremia: Secondary | ICD-10-CM | POA: Insufficient documentation

## 2014-08-25 DIAGNOSIS — Z8572 Personal history of non-Hodgkin lymphomas: Secondary | ICD-10-CM | POA: Insufficient documentation

## 2014-08-25 DIAGNOSIS — M5136 Other intervertebral disc degeneration, lumbar region: Secondary | ICD-10-CM | POA: Insufficient documentation

## 2014-08-25 MED ORDER — FEXOFENADINE HCL 180 MG PO TABS: 180.0000 mg | ORAL_TABLET | Freq: Every day | ORAL | Status: DC

## 2014-08-25 MED ORDER — FLUTICASONE PROPIONATE 50 MCG/ACT NA SUSP: 2.0000 | Freq: Every day | NASAL | Status: DC

## 2014-08-25 MED ORDER — AMOXICILLIN-POT CLAVULANATE 875-125 MG PO TABS: 1.0000 | ORAL_TABLET | Freq: Two times a day (BID) | ORAL | Status: DC

## 2014-08-25 NOTE — Patient Instructions (Signed)
Start the antihistamine (generic Allegra) and steroid nasal spray. If sinus drainage becomes yellow or green start the antibiotic.

## 2014-08-25 NOTE — Progress Notes (Signed)
Subjective:     [name], female   DOB: 1928/06/07, 81 y.o.   MRN: [id number]  HPI  Chief Complaint  Patient presents with  . Headache    started yesterday. States she has history of sinus infections  . Facial Pain  Reports clear sinus drip for two weeks prior to the onset of frontal and right maxillary pressure. Has taken an antihistamine on one occasion.   Review of Systems  Constitutional: Negative for fever and chills.  Respiratory: Negative for cough.        Objective:   Physical Exam  Constitutional: She appears well-developed and well-nourished. No distress.  Ears: T.M's intact without inflammation Sinuses: mild right maxillary tenderness Throat: no tonsillar enlargement or exudate, upper dentures present Neck: no cervical adenopathy Lungs: clear     Assessment:    1. Allergic rhinitis, unspecified allergic rhinitis type - fluticasone (FLONASE) 50 MCG/ACT nasal spray; Place 2 sprays into both nostrils daily.  Dispense: 16 g; Refill: 6 - amoxicillin-clavulanate (AUGMENTIN) 875-125 MG per tablet; Take 1 tablet by mouth 2 (two) times daily.  Dispense: 20 tablet; Refill: 0    Plan:   Fexofenadine refilled. Nasal spray technique discussed. If she sees purulent drainage to start antibiotic.

## 2014-09-20 ENCOUNTER — Ambulatory Visit (INDEPENDENT_AMBULATORY_CARE_PROVIDER_SITE_OTHER): Payer: Medicare Other | Admitting: Family Medicine

## 2014-09-20 ENCOUNTER — Encounter: Payer: Self-pay | Admitting: Family Medicine

## 2014-09-20 VITALS — BP 98/60 | HR 78 | Temp 98.3°F | Resp 16 | Wt 157.6 lb

## 2014-09-20 DIAGNOSIS — N3091 Cystitis, unspecified with hematuria: Secondary | ICD-10-CM | POA: Diagnosis not present

## 2014-09-20 LAB — POCT URINALYSIS DIPSTICK
Bilirubin, UA: NEGATIVE
Glucose, UA: NEGATIVE
Ketones, UA: NEGATIVE
Nitrite, UA: POSITIVE
Protein, UA: NEGATIVE
Spec Grav, UA: <=1.005
Urobilinogen, UA: 1
pH, UA: 6.5

## 2014-09-20 MED ORDER — CEPHALEXIN 500 MG PO CAPS: 500.0000 mg | ORAL_CAPSULE | Freq: Two times a day (BID) | ORAL | Status: DC

## 2014-09-20 NOTE — Patient Instructions (Signed)
We will call you with the culture result. 

## 2014-09-20 NOTE — Progress Notes (Signed)
Subjective:     [name], female   DOB: November 22, 1928, 81 y.o.   MRN: [id number]  HPI  Chief Complaint  Patient presents with  . Urinary Tract Infection  Reports developing low back pain, urinary urgency, and dark urine a weeks ago. Last two urine cultures have revealed Enterococcus. Patient reports she was on Macrobid per urology in the past and "it quit working for me."   Review of Systems  Constitutional: Negative for fever and chills.       Objective:   Physical Exam  Constitutional: She appears well-developed and well-nourished. No distress.  Genitourinary:  No cva tenderness       Assessment:    1. Cystitis with hematuria - POCT urinalysis dipstick - Urine culture - cephALEXin (KEFLEX) 500 MG capsule; Take 1 capsule (500 mg total) by mouth 2 (two) times daily.  Dispense: 14 capsule; Refill: 0    Plan:    Further f/u pending urine culture.

## 2014-09-23 ENCOUNTER — Other Ambulatory Visit: Payer: Self-pay | Admitting: Family Medicine

## 2014-09-23 DIAGNOSIS — N3090 Cystitis, unspecified without hematuria: Secondary | ICD-10-CM

## 2014-09-23 LAB — URINE CULTURE

## 2014-09-23 MED ORDER — DOXYCYCLINE HYCLATE 100 MG PO TABS: 100.0000 mg | ORAL_TABLET | Freq: Two times a day (BID) | ORAL | Status: DC

## 2014-10-12 ENCOUNTER — Encounter: Payer: Self-pay | Admitting: Family Medicine

## 2014-10-12 ENCOUNTER — Ambulatory Visit (INDEPENDENT_AMBULATORY_CARE_PROVIDER_SITE_OTHER): Payer: Medicare Other | Admitting: Family Medicine

## 2014-10-12 VITALS — BP 128/62 | HR 80 | Temp 98.0°F | Resp 16 | Ht 62.0 in | Wt 155.0 lb

## 2014-10-12 DIAGNOSIS — Z0000 Encounter for general adult medical examination without abnormal findings: Secondary | ICD-10-CM | POA: Diagnosis not present

## 2014-10-12 DIAGNOSIS — Z23 Encounter for immunization: Secondary | ICD-10-CM | POA: Diagnosis not present

## 2014-10-12 NOTE — Progress Notes (Signed)
[name], female   DOB: 19-Mar-1928, 81 y.o.   MRN: [id number]       Patient: Monica Singh, Female    DOB: [birth year], 81 y.o.   MRN: [id number] Visit Date: 10/12/2014  Today's Provider: Margarita Rana, MD   Chief Complaint  Patient presents with  . Annual Exam   Subjective:    Annual wellness visit [place] H Apodaca is a 81 y.o. female. She feels well. She reports exercising about 84mins 3 days a week. She reports she is sleeping well.   Has not acute concerns today.   Last:  Mammogram- 01/03/2014- normal  Colonoscopy- 11/20/2010- Diverticulosis, Repeat in 5 years  BMD- 02/28/2014- Osteoporosis  EKG- 09/29/2010   Tdap- 10/10/2010  Prevar- 12/22/2011   Review of Systems  Constitutional: Negative.   HENT: Negative.   Eyes: Negative.   Respiratory: Negative.   Cardiovascular: Negative.   Gastrointestinal: Negative.   Endocrine: Negative.   Genitourinary: Negative.   Musculoskeletal: Positive for back pain.       Patient thinks it could be a pulled muscle.   Skin: Negative.   Allergic/Immunologic: Negative.   Neurological: Negative.   Hematological: Negative.   Psychiatric/Behavioral: Negative.   All other systems reviewed and are negative.   Social History   Social History  . Marital Status: Widowed    Spouse Name: N/A  . Number of Children: N/A  . Years of Education: N/A   Occupational History  . Not on file.   Social History Main Topics  . Smoking status: Former Smoker -- 0.25 packs/day    Types: Cigarettes    Quit date: 06/21/1989  . Smokeless tobacco: Former Systems developer    Quit date: 01/28/1989  . Alcohol Use: 0.0 oz/week    0 Standard drinks or equivalent per week     Comment: occassional wine  . Drug Use: No  . Sexual Activity: No   Other Topics Concern  . Not on file   Social History Narrative    Patient Active Problem List   Diagnosis Date Noted  . Abnormal chest sounds 08/25/2014  . Allergic rhinitis 08/25/2014  .  Colon polyp 08/25/2014  . Acute exacerbation of chronic obstructive airways disease 08/25/2014  . DDD (degenerative disc disease), cervical 08/25/2014  . DDD (degenerative disc disease), lumbar 08/25/2014  . H/O non-Hodgkin's lymphoma 08/25/2014  . Below normal amount of sodium in the blood 08/25/2014  . Arthritis of knee, degenerative 08/25/2014  . Subclinical hypothyroidism 08/25/2014  . Ovarian cyst 06/22/2014  . Adnexal mass 06/03/2014  . Herpes zoster without complication 29/52/8413  . Lymphoma of small bowel 03/11/2008  . Current tobacco use 03/11/2008    Past Surgical History  Procedure Laterality Date  . Portacath placement  2009  . Cervical neck fusion  1995  . Cataract extraction  1997    right eye  . Laparotomy with biopsy  03/02/2007  . Vaginal hysterectomy  1971  . Spine surgery  1997  . Dental surgery      screws  . Squamous cell carcinoma excision      right arm  . Total knee arthroplasty Right   . Abdominal surgery  2009    abdominal mass+ NH lymphoma,    Her family history includes Breast cancer in her sister; CAD in her brother; Cataracts in her sister; Dementia in her sister; Heart attack in her brother and brother; Leukemia in her grandchild.    Previous Medications   CIPROFLOXACIN (CIPRO) 250 MG TABLET  DOCUSATE SODIUM (COLACE) 100 MG CAPSULE    Take 100 mg by mouth 1 day or 1 dose.   DOXYCYCLINE (VIBRA-TABS) 100 MG TABLET    Take 1 tablet (100 mg total) by mouth 2 (two) times daily.   FEXOFENADINE (ALLEGRA) 180 MG TABLET    Take 1 tablet (180 mg total) by mouth daily.   FLUTICASONE (FLONASE) 50 MCG/ACT NASAL SPRAY    Place 2 sprays into both nostrils daily.   MOMETASONE-FORMOTEROL (DULERA) 100-5 MCG/ACT AERO    Inhale into the lungs.    Patient Care Team: Margarita Rana, MD as PCP - General (Family Medicine) Margarita Rana, MD (Family Medicine)     Objective:   Vitals: BP 128/62 mmHg  Pulse 80  Temp(Src) 98 F (36.7 C)  Resp 16  Ht 5\' 2"   (1.575 m)  Wt 155 lb (70.308 kg)  BMI 28.34 kg/m2  Physical Exam  Constitutional: She is oriented to person, place, and time. She appears well-developed and well-nourished.  HENT:  Head: Normocephalic and atraumatic.  Right Ear: External ear normal.  Left Ear: External ear normal.  Nose: Nose normal.  Mouth/Throat: Oropharynx is clear and moist.  Eyes: Conjunctivae and EOM are normal. Pupils are equal, round, and reactive to light.  Cardiovascular: Normal rate, regular rhythm, normal heart sounds and intact distal pulses.   Pulmonary/Chest: Effort normal and breath sounds normal.  Abdominal: Soft. Bowel sounds are normal.  Musculoskeletal: Normal range of motion.  Neurological: She is alert and oriented to person, place, and time.  Psychiatric: She has a normal mood and affect. Her behavior is normal. Judgment and thought content normal.  Nursing note reviewed.   Activities of Daily Living In your present state of health, do you have any difficulty performing the following activities: 10/12/2014 08/25/2014  Hearing? Y N  Vision? N N  Difficulty concentrating or making decisions? Y N  Walking or climbing stairs? N N  Dressing or bathing? N N  Doing errands, shopping? N N    Fall Risk Assessment Fall Risk  10/12/2014  Falls in the past year? No     Depression Screen PHQ 2/9 Scores 10/12/2014  PHQ - 2 Score 0   History  Alcohol Use  . 0.0 oz/week  . 0 Standard drinks or equivalent per week    Comment: occassional wine      Cognitive Testing - 6-CIT  Correct? Score   What year is it? yes 0 0 or 4  What month is it? yes 0 0 or 3  Memorize:    Pia Mau,  44,  [address]  [place],      What time is it? (within 1 hour) yes 0 0 or 3  Count backwards from 20 yes 0 0, 2, or 4  Name the months of the year yes 0 0, 2, or 4  Repeat name & address above yes 0 0, 2, 4, 6, 8, or 10       TOTAL SCORE  0/28   Interpretation:  Normal  Normal (0-7) Abnormal (8-28)        Assessment & Plan:     Annual Wellness Visit  Reviewed patient's Family Medical History Reviewed and updated list of patient's medical providers Assessment of cognitive impairment was done Assessed patient's functional ability Established a written schedule for health screening [place] Completed and Reviewed  Exercise Activities and Dietary recommendations Goals    None      Immunization History  Administered Date(s)  Administered  . Influenza-Unspecified 09/28/2013  . Pneumococcal Conjugate-13 12/21/2012  . Tdap 10/17/2010  . Zoster 10/14/2008    Health Maintenance  Topic Date Due  . COLONOSCOPY  09/10/1980  . DEXA SCAN  09/11/1995  . PNA vac Low Risk Adult (2 of 2 - PPSV23) 12/21/2013  . INFLUENZA VACCINE  08/29/2014  . TETANUS/TDAP  10/16/2020  . ZOSTAVAX  Completed   1. Medicare annual wellness visit, subsequent As above.     Discussed health benefits of physical activity, and encouraged her to engage in regular exercise appropriate for her age and condition.   Patient was seen and examined by Jerrell Belfast, MD, and scribed by Wilburt Finlay, [place].   I have reviewed the document for accuracy and completeness and I agree with above. Jerrell Belfast, MD  Margarita Rana, MD    ------------------------------------------------------------------------------------------------------------

## 2014-10-14 ENCOUNTER — Other Ambulatory Visit: Payer: Self-pay | Admitting: Family Medicine

## 2014-10-14 ENCOUNTER — Ambulatory Visit (INDEPENDENT_AMBULATORY_CARE_PROVIDER_SITE_OTHER): Payer: Medicare Other | Admitting: Family Medicine

## 2014-10-14 ENCOUNTER — Encounter: Payer: Self-pay | Admitting: Family Medicine

## 2014-10-14 VITALS — BP 118/56 | HR 95 | Temp 98.1°F | Resp 16 | Wt 156.2 lb

## 2014-10-14 DIAGNOSIS — N3091 Cystitis, unspecified with hematuria: Secondary | ICD-10-CM

## 2014-10-14 LAB — POCT URINALYSIS DIPSTICK
Bilirubin, UA: NEGATIVE
Glucose, UA: NEGATIVE
Ketones, UA: NEGATIVE
Nitrite, UA: POSITIVE
Spec Grav, UA: <=1.005
Urobilinogen, UA: 1
pH, UA: 7.5

## 2014-10-14 MED ORDER — DOXYCYCLINE HYCLATE 100 MG PO CAPS: 100.0000 mg | ORAL_CAPSULE | Freq: Two times a day (BID) | ORAL | Status: DC

## 2014-10-14 NOTE — Patient Instructions (Signed)
We will call you with the urine culture report. Consider urology referral.

## 2014-10-14 NOTE — Progress Notes (Signed)
Subjective:     [name], female   DOB: 1928-05-04, 81 y.o.   MRN: [id number]  HPI  Chief Complaint  Patient presents with  . Dysuria    Patient comes in office today with concerns of burning with urination and frequency for the past 24hrs.   Last treated for an Enterobacter infection with doxycycline on 8/26.   Review of Systems  Constitutional: Negative for fever and chills.       Objective:   Physical Exam  Constitutional: She appears well-developed and well-nourished. No distress.  Genitourinary:  No c.v. tenderness       Assessment:    1. Cystitis with hematuria - POCT urinalysis dipstick - Urine culture - doxycycline (VIBRAMYCIN) 100 MG capsule; Take 1 capsule (100 mg total) by mouth 2 (two) times daily.  Dispense: 14 capsule; Refill: 0    Plan:    Consider urology referral.

## 2014-10-18 LAB — URINE CULTURE

## 2014-10-19 ENCOUNTER — Other Ambulatory Visit: Payer: Self-pay | Admitting: Family Medicine

## 2014-10-19 ENCOUNTER — Telehealth: Payer: Self-pay

## 2014-10-19 DIAGNOSIS — N3090 Cystitis, unspecified without hematuria: Secondary | ICD-10-CM

## 2014-10-19 NOTE — Telephone Encounter (Signed)
Patient has been advised she would like for you to generate referral to Urologist.KW

## 2014-10-19 NOTE — Telephone Encounter (Signed)
Referral in progress. 

## 2014-10-19 NOTE — Telephone Encounter (Signed)
-----   Message from Carmon Ginsberg, [place] sent at 10/18/2014  2:02 PM EDT ----- Continue doxycycline for an Enterobacter infection (same bug as last month). Do you wish to see a urologist? Dr. Venia Minks thinks it may be helpful.

## 2014-11-04 ENCOUNTER — Ambulatory Visit: Payer: Self-pay | Admitting: Urology

## 2014-11-04 ENCOUNTER — Encounter: Payer: Self-pay | Admitting: Urology

## 2014-11-17 ENCOUNTER — Encounter: Payer: Self-pay | Admitting: Urology

## 2014-11-17 ENCOUNTER — Ambulatory Visit (INDEPENDENT_AMBULATORY_CARE_PROVIDER_SITE_OTHER): Payer: Medicare Other | Admitting: Urology

## 2014-11-17 VITALS — BP 137/83 | HR 81 | Ht 63.0 in | Wt 155.2 lb

## 2014-11-17 DIAGNOSIS — K5900 Constipation, unspecified: Secondary | ICD-10-CM

## 2014-11-17 DIAGNOSIS — N390 Urinary tract infection, site not specified: Secondary | ICD-10-CM | POA: Diagnosis not present

## 2014-11-17 LAB — URINALYSIS, COMPLETE
Bilirubin, UA: NEGATIVE
Glucose, UA: NEGATIVE
Ketones, UA: NEGATIVE
Leukocytes, UA: NEGATIVE
Nitrite, UA: NEGATIVE
Protein, UA: NEGATIVE
RBC, UA: NEGATIVE
Specific Gravity, UA: 1.015 (ref 1.005–1.030)
Urobilinogen, Ur: 0.2 mg/dL (ref 0.2–1.0)
pH, UA: 7 (ref 5.0–7.5)

## 2014-11-17 LAB — MICROSCOPIC EXAMINATION
Bacteria, UA: NONE SEEN
RBC, UA: NONE SEEN /hpf (ref 0–?)
Renal Epithel, UA: NONE SEEN /hpf
WBC, UA: NONE SEEN /hpf (ref 0–?)

## 2014-11-17 NOTE — Progress Notes (Signed)
11/17/2014 11:45 AM   [place] 1928-03-05 [id number]  Referring provider: Margarita Rana, MD [address] [place], [place] [address]  Chief Complaint  Patient presents with  . Cystitis    new pt    HPI: The patient is a 81 year old female with recurrent urinary tract infections. In review of her EMR, she has had two Enterobacter aerogenes infections in the last 2 months. She was originally treated with Keflex which is resistant to. Her most recent infection was treated with doxycycline which it was sensitive to. Her urinalysis today was cleaned with exception of 0-10 epithelial cells.  The patient states that she has a partially for urinary tract infections per year. Usually what happens, she is treated for a urinary tract infection prophylactically then she gets the culture results and has to take a strong antibiotic. After that time, she is 4-5 months without other urinary tract infection. She has tried ashen cream in the past. She does not feel as was helpful. She has never taken suppressive antibiotics.  Terms of her voiding function, she notes nocturia 1 and occasional urge and stress incontinence. She does not find these issues bothersome. She feels as long she enters the bladder revealing that she does not have these issues with urinary urge and stress incontinence. She has seen a urologist in [facility] for the same issues. She is not interested at this time and taking a long-term suppressive antibiotic. She is not interested in starting again as it was not helpful.  She does have a history of constipation. She does note that she has regular soft bowel movements when she takes her stool softener. She doesn't nightly.   PMH: Past Medical History  Diagnosis Date  . Adnexal mass 06/03/2014    since 2009  . 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 HTN  (hypertension)   . GERD (gastroesophageal reflux disease)     Surgical History: Past Surgical History  Procedure Laterality Date  . Portacath placement  2009  . Cervical neck fusion  1995  . Cataract extraction  1997    right eye  . Laparotomy with biopsy  03/02/2007  . Vaginal hysterectomy  1971  . Spine surgery  1997  . Dental surgery      screws  . Squamous cell carcinoma excision      right arm  . Total knee arthroplasty Right   . Abdominal surgery  2009    abdominal mass+ NH lymphoma,    Home Medications:    Medication List       This list is accurate as of: 11/17/14 11:45 AM.  Always use your most recent med list.               docusate sodium 100 MG capsule  Commonly known as:  COLACE  Take 100 mg by mouth 1 day or 1 dose.     DULERA 100-5 MCG/ACT Aero  Generic drug:  mometasone-formoterol  Inhale into the lungs.     fexofenadine 180 MG tablet  Commonly known as:  ALLEGRA  Take 1 tablet (180 mg total) by mouth daily.     fluticasone 50 MCG/ACT nasal spray  Commonly known as:  FLONASE  Place 2 sprays into both nostrils daily.        Allergies:  Allergies  Allergen Reactions  . Milk-Related Compounds Diarrhea and Nausea And Vomiting  .  Sulfa Antibiotics Hives and Itching    Family History: Family History  Problem Relation Age of Onset  . Breast cancer Sister   . Leukemia Grandchild     granddaughter  . Dementia Sister   . Cataracts Sister   . Heart attack Brother   . CAD Brother   . Heart attack Brother     Social History:  reports that she quit smoking about 25 years ago. Her smoking use included Cigarettes. She smoked 0.25 packs per day. She quit smokeless tobacco use about 25 years ago. She reports that she drinks alcohol. She reports that she does not use illicit drugs.  ROS: UROLOGY Frequent Urination?: No Hard to postpone urination?: Yes Burning/pain with urination?: No Get up at night to urinate?: Yes Leakage of urine?:  Yes Urine stream starts and stops?: No Trouble starting stream?: No Do you have to strain to urinate?: No Blood in urine?: No Urinary tract infection?: Yes Sexually transmitted disease?: No Injury to kidneys or bladder?: No Painful intercourse?: No Weak stream?: No Currently pregnant?: No Vaginal bleeding?: No Last menstrual period?: 1970  Gastrointestinal Nausea?: No Vomiting?: No Indigestion/heartburn?: Yes Diarrhea?: No Constipation?: Yes  Constitutional Fever: No Night sweats?: No Weight loss?: No Fatigue?: No  Skin Skin rash/lesions?: No Itching?: No  Eyes Blurred vision?: No Double vision?: No  Ears/Nose/Throat Sore throat?: No Sinus problems?: No  Hematologic/Lymphatic Swollen glands?: No Easy bruising?: No  Cardiovascular Leg swelling?: No Chest pain?: No  Respiratory Cough?: No Shortness of breath?: No  Endocrine Excessive thirst?: No  Musculoskeletal Back pain?: No Joint pain?: No  Neurological Headaches?: No Dizziness?: No  Psychologic Depression?: No Anxiety?: No  Physical Exam: BP 137/83 mmHg  Pulse 81  Ht 5\' 3"  (1.6 m)  Wt 155 lb 3.2 oz (70.398 kg)  BMI 27.50 kg/m2  Constitutional:  Alert and oriented, No acute distress. HEENT: [place] AT, moist mucus membranes.  Trachea midline, no masses. Cardiovascular: No clubbing, cyanosis, or edema. Respiratory: Normal respiratory effort, no increased work of breathing. GI: Abdomen is soft, nontender, nondistended, no abdominal masses GU: No CVA tenderness.   No significant cystocele on vaginal exam. No stress urinary incontinence noted. There was vaginal atrophy.   A chaperone was present for the entirety of the exam. Skin: No rashes, bruises or suspicious lesions. Lymph: No cervical or inguinal adenopathy. Neurologic: Grossly intact, no focal deficits, moving all 4 extremities. Psychiatric: Normal mood and affect.  Laboratory Data: Lab Results  Component Value Date   WBC 7.6  06/22/2014   HGB 13.6 06/22/2014   HCT 40.4 06/22/2014   MCV 91.8 06/22/2014   PLT 281 06/22/2014    Lab Results  Component Value Date   CREATININE 0.91 06/22/2014    No results found for: PSA  No results found for: TESTOSTERONE  No results found for: HGBA1C  Urinalysis    Component Value Date/Time   COLORURINE Yellow 01/09/2013 0647   APPEARANCEUR Hazy 01/09/2013 0647   LABSPEC 1.010 01/09/2013 [place] 7.0 01/09/2013 0647   GLUCOSEU Negative 01/09/2013 0647   HGBUR Negative 01/09/2013 0647   BILIRUBINUR negative 10/14/2014 1630   BILIRUBINUR Negative 01/09/2013 0647   KETONESUR Trace 01/09/2013 0647   PROTEINUR trace 10/14/2014 1630   PROTEINUR Negative 01/09/2013 0647   UROBILINOGEN 1.0 10/14/2014 1630   NITRITE positive 10/14/2014 1630   NITRITE Negative 01/09/2013 0647   LEUKOCYTESUR large (3+)* 10/14/2014 1630   LEUKOCYTESUR 2+ 01/09/2013 0647    Pertinent Imaging: PVR: 24 cc  Assessment & Plan:    1. Recurrent UTI  the patient usually can go 4-5 months without a urinary tract infection and is not interested in trying estrogen again or being on a suppressive antibiotic. For now we will watch her conservatively and have her follow up in 3 months to assess urinary tract infections if any she has in the interim.  2.  Constipation -Continue bowel regimen   Return in about 3 months (around 02/17/2015).  Nickie Retort, MD  [facility] [address], [place], [place] [address] [phone]

## 2014-11-24 ENCOUNTER — Other Ambulatory Visit (INDEPENDENT_AMBULATORY_CARE_PROVIDER_SITE_OTHER): Payer: Medicare Other

## 2014-11-24 DIAGNOSIS — Z23 Encounter for immunization: Secondary | ICD-10-CM

## 2014-11-30 ENCOUNTER — Ambulatory Visit: Payer: Self-pay

## 2014-12-01 ENCOUNTER — Other Ambulatory Visit: Payer: Medicare Other

## 2014-12-01 ENCOUNTER — Telehealth: Payer: Self-pay | Admitting: Urology

## 2014-12-01 DIAGNOSIS — N390 Urinary tract infection, site not specified: Secondary | ICD-10-CM

## 2014-12-01 LAB — MICROSCOPIC EXAMINATION
Epithelial Cells (non renal): NONE SEEN /hpf (ref 0–10)
RBC, UA: >30 /hpf — AB (ref 0–?)
Renal Epithel, UA: NONE SEEN /hpf
WBC, UA: >30 /hpf — AB (ref 0–?)

## 2014-12-01 LAB — URINALYSIS, COMPLETE
Bilirubin, UA: NEGATIVE
Glucose, UA: NEGATIVE
Nitrite, UA: NEGATIVE
Specific Gravity, UA: 1.025 (ref 1.005–1.030)
Urobilinogen, Ur: 0.2 mg/dL (ref 0.2–1.0)
pH, UA: 5.5 (ref 5.0–7.5)

## 2014-12-01 NOTE — Progress Notes (Signed)
Patient states that she is having frequency, pressure, and dysuria. She dropped off urine to check on possible infection. She was notified before she left that we would send urine for culture and call her with results on Monday. She is to increase water intake and utilize AZO if necessary.

## 2014-12-01 NOTE — Telephone Encounter (Signed)
Pt called and stated she saw Dr. Pilar Jarvis in October.  She feels like she has a UTI and wants to come in and give sample.  Judson Roch said it was O.K. As long as she is here before 2:30.

## 2014-12-03 LAB — CULTURE, URINE COMPREHENSIVE

## 2014-12-06 ENCOUNTER — Telehealth: Payer: Self-pay

## 2014-12-06 DIAGNOSIS — N390 Urinary tract infection, site not specified: Secondary | ICD-10-CM

## 2014-12-06 MED ORDER — CIPROFLOXACIN HCL 500 MG PO TABS: 500.0000 mg | ORAL_TABLET | Freq: Two times a day (BID) | ORAL | Status: AC

## 2014-12-06 NOTE — Telephone Encounter (Signed)
Ok. Let's do Cipro 500 mg BID for a week    ----- Message -----     From: Lestine Box, LPN     Sent: 93/08/1827  9:28 AM      To: Nickie Retort, MD        Pt has an allergy to sulfa with reaction being hives and itching. Please advise.     ----- Message -----     From: Nickie Retort, MD     Sent: 12/05/2014  3:32 PM      To: Lestine Box, LPN        PAtient will need one week of Bactrim DS BID. Thanks.    Medication has been sent to pharmacy.

## 2014-12-06 NOTE — Telephone Encounter (Signed)
Message     PAtient will need one week of Bactrim DS BID. Thanks.   LMOM- Medication not called into pharmacy due to allergies. Message sent to Dr. Pilar Jarvis.

## 2014-12-07 NOTE — Telephone Encounter (Signed)
Notified pt of medication sent to pharmacy. Pt voices understanding.

## 2015-01-16 ENCOUNTER — Ambulatory Visit (INDEPENDENT_AMBULATORY_CARE_PROVIDER_SITE_OTHER): Payer: Medicare Other | Admitting: Physician Assistant

## 2015-01-16 ENCOUNTER — Encounter: Payer: Self-pay | Admitting: Physician Assistant

## 2015-01-16 VITALS — BP 130/62 | HR 85 | Temp 98.0°F | Resp 15 | Wt 156.4 lb

## 2015-01-16 DIAGNOSIS — J0140 Acute pansinusitis, unspecified: Secondary | ICD-10-CM

## 2015-01-16 MED ORDER — AZITHROMYCIN 250 MG PO TABS: ORAL_TABLET | ORAL | Status: DC

## 2015-01-16 NOTE — Progress Notes (Signed)
Patient: Monica Singh    DOB: Jan 30, 1928   81 y.o.   MRN: [id number] Visit Date: 01/16/2015  Today's Provider: Mar Daring, [name]   Chief Complaint  Patient presents with  . Sinusitis  . Cough   Subjective:    Sinusitis This is a new problem. The current episode started in the past 7 days. The problem has been gradually worsening since onset. There has been no fever. Associated symptoms include congestion, coughing (mild; daytime), headaches, sinus pressure, sneezing and a sore throat. Pertinent negatives include no chills, ear pain or shortness of breath. Diaphoresis: dry cough. Past treatments include spray decongestants. The treatment provided no relief.  Cough This is a new problem. The current episode started in the past 7 days. The problem has been gradually worsening. The cough is non-productive. Associated symptoms include headaches, postnasal drip, rhinorrhea and a sore throat. Pertinent negatives include no chest pain, chills, ear congestion, ear pain, fever, shortness of breath or wheezing. Nothing aggravates the symptoms. Treatments tried: nasal Spray. The treatment provided no relief.       Allergies  Allergen Reactions  . Milk-Related Compounds Diarrhea and Nausea And Vomiting  . Sulfa Antibiotics Hives and Itching   Previous Medications   DOCUSATE SODIUM (COLACE) 100 MG CAPSULE    Take 100 mg by mouth 1 day or 1 dose.   FEXOFENADINE (ALLEGRA) 180 MG TABLET    Take 1 tablet (180 mg total) by mouth daily.   FLUTICASONE (FLONASE) 50 MCG/ACT NASAL SPRAY    Place 2 sprays into both nostrils daily.   MOMETASONE-FORMOTEROL (DULERA) 100-5 MCG/ACT AERO    Inhale into the lungs.    Review of Systems  Constitutional: Negative for fever, chills and fatigue. Diaphoresis: dry cough.  HENT: Positive for congestion, postnasal drip, rhinorrhea, sinus pressure, sneezing and sore throat. Negative for ear pain, hearing loss, tinnitus (at night), trouble  swallowing (mostly clear) and voice change.   Eyes: Negative.   Respiratory: Positive for cough (mild; daytime). Negative for chest tightness, shortness of breath and wheezing.   Cardiovascular: Negative for chest pain and palpitations.  Gastrointestinal: Negative for nausea, vomiting and abdominal pain.  Neurological: Positive for headaches. Negative for dizziness.    Social History  Substance Use Topics  . Smoking status: Former Smoker -- 0.25 packs/day    Types: Cigarettes    Quit date: 06/21/1989  . Smokeless tobacco: Former Systems developer    Quit date: 01/28/1989  . Alcohol Use: 0.0 oz/week    0 Standard drinks or equivalent per week     Comment: occassional wine   Objective:   BP 130/62 mmHg  Pulse 85  Temp(Src) 98 F (36.7 C) (Oral)  Resp 15  Wt 156 lb 6.4 oz (70.943 kg)  SpO2 95%  Physical Exam  Constitutional: She appears well-developed and well-nourished. No distress.  HENT:  Head: Normocephalic and atraumatic.  Right Ear: Hearing, tympanic membrane, external ear and ear canal normal.  Left Ear: Hearing, tympanic membrane, external ear and ear canal normal.  Nose: Mucosal edema present. No rhinorrhea. Right sinus exhibits maxillary sinus tenderness and frontal sinus tenderness. Left sinus exhibits maxillary sinus tenderness and frontal sinus tenderness.  Mouth/Throat: Uvula is midline, oropharynx is clear and moist and mucous membranes are normal. No oropharyngeal exudate, posterior oropharyngeal edema or posterior oropharyngeal erythema.  Neck: Normal range of motion. Neck supple. No tracheal deviation present. No thyromegaly present.  Cardiovascular: Normal rate, regular rhythm and normal heart sounds.  Exam reveals no gallop and no friction rub.   No murmur heard. Pulmonary/Chest: Effort normal and breath sounds normal. No stridor. No respiratory distress. She has no wheezes. She has no rales.  Lymphadenopathy:    She has no cervical adenopathy.  Skin: She is not  diaphoretic.  Vitals reviewed.       Assessment & Plan:     1. Acute pansinusitis, recurrence not specified  worsening. I will treat with azithromycin as below. I also advised her to start using her Flonase until the congestion clears. She may also start taking her Allegra if needed. I did advise her to make sure that she continues to stay well-hydrated and gets plenty of rest. She is to call the office if symptoms fail to improve or worsen. - azithromycin (ZITHROMAX) 250 MG tablet; Take 2 tablets PO on day one, and one tablet PO daily thereafter until completed.  Dispense: 6 tablet; Refill: 0       Mar Daring, [name]  [facility]

## 2015-01-16 NOTE — Patient Instructions (Signed)

## 2015-01-19 ENCOUNTER — Telehealth: Payer: Self-pay

## 2015-01-19 DIAGNOSIS — J0140 Acute pansinusitis, unspecified: Secondary | ICD-10-CM

## 2015-01-19 MED ORDER — AMOXICILLIN-POT CLAVULANATE 875-125 MG PO TABS: 1.0000 | ORAL_TABLET | Freq: Two times a day (BID) | ORAL | Status: DC

## 2015-01-19 NOTE — Telephone Encounter (Signed)
Patient advised as directed below and to pick up new antibiotic.  Thanks,  -Tekelia Kareem

## 2015-01-19 NOTE — Telephone Encounter (Signed)
Will try augmentin instead of z-pak.

## 2015-01-19 NOTE — Telephone Encounter (Signed)
Patient states she started antibiotic on Monday. Patient states she is no better and may actually be worse. Patient would like recommendations of what to do now. CB#[phone].

## 2015-02-14 ENCOUNTER — Ambulatory Visit: Payer: Self-pay

## 2015-02-17 ENCOUNTER — Encounter: Payer: Self-pay | Admitting: Urology

## 2015-02-17 ENCOUNTER — Ambulatory Visit (INDEPENDENT_AMBULATORY_CARE_PROVIDER_SITE_OTHER): Payer: Medicare Other | Admitting: Urology

## 2015-02-17 VITALS — BP 126/80 | HR 89 | Ht 62.0 in | Wt 154.5 lb

## 2015-02-17 DIAGNOSIS — N390 Urinary tract infection, site not specified: Secondary | ICD-10-CM

## 2015-02-17 LAB — URINALYSIS, COMPLETE
Bilirubin, UA: NEGATIVE
Glucose, UA: NEGATIVE
Nitrite, UA: NEGATIVE
RBC, UA: NEGATIVE
Specific Gravity, UA: 1.02 (ref 1.005–1.030)
Urobilinogen, Ur: 1 mg/dL (ref 0.2–1.0)
pH, UA: 7 (ref 5.0–7.5)

## 2015-02-17 LAB — MICROSCOPIC EXAMINATION: WBC, UA: >30 /hpf — ABNORMAL HIGH (ref 0–?)

## 2015-02-17 NOTE — Progress Notes (Signed)
02/17/2015 4:11 PM   [place] 04/08/1928 [id number]  Referring provider: Margarita Rana, MD [address] [place], [place] [address]  Chief Complaint  Patient presents with  . Recurrent UTI    3 month follow up    HPI: The patient is a 82 year old female with recurrent urinary tract infections. In review of her EMR, she has had two Enterobacter aerogenes infections in the last 2 months. She was originally treated with Keflex which is resistant to. Her most recent infection was treated with doxycycline which it was sensitive to. Her urinalysis today was cleaned with exception of 0-10 epithelial cells.  The patient states that she has a partially for urinary tract infections per year. Usually what happens, she is treated for a urinary tract infection prophylactically then she gets the culture results and has to take a strong antibiotic. After that time, she is 4-5 months without other urinary tract infection. She has tried ashen cream in the past. She does not feel as was helpful. She has never taken suppressive antibiotics.  Terms of her voiding function, she notes nocturia 1 and occasional urge and stress incontinence. She does not find these issues bothersome. She feels as long she enters the bladder revealing that she does not have these issues with urinary urge and stress incontinence. She has seen a urologist in [facility] for the same issues. She is not interested at this time and taking a long-term suppressive antibiotic. She is not interested in starting again as it was not helpful.  She does have a history of constipation. She does note that she has regular soft bowel movements when she takes her stool softener. She doesn't nightly.    PVR was 24 cc. Vaginal exam was negative except for vaginal atrophy. Next  January 2017 Interval History:  she is on quite well since her last visit. She is not no symptoms of urinary tract infections. Should  total of 2-3 in the last year. She is not interested in any treatment this time which is very reasonable given her frequency of urinary tract infections. She does not end up in the hospital due to urinary tract infection. She usually feels that the burning, guarding appropriate care at that time.   PMH: Past Medical History  Diagnosis Date  . Adnexal mass 06/03/2014    since 2009  . 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 HTN (hypertension)   . GERD (gastroesophageal reflux disease)   . Recurrent UTI     Surgical History: Past Surgical History  Procedure Laterality Date  . Portacath placement  2009  . Cervical neck fusion  1995  . Cataract extraction  1997    right eye  . Laparotomy with biopsy  03/02/2007  . Vaginal hysterectomy  1971  . Spine surgery  1997  . Dental surgery      screws  . Squamous cell carcinoma excision      right arm  . Total knee arthroplasty Right   . Abdominal surgery  2009    abdominal mass+ NH lymphoma,    Home Medications:    Medication List       This list is accurate as of: 02/17/15  4:11 PM.  Always use your most recent med list.               amoxicillin-clavulanate 875-125 MG tablet  Commonly known as:  AUGMENTIN  Take 1 tablet by mouth 2 (two) times daily.     docusate sodium 100 MG capsule  Commonly known as:  COLACE  Take 100 mg by mouth 1 day or 1 dose.     DULERA 100-5 MCG/ACT Aero  Generic drug:  mometasone-formoterol  Inhale into the lungs.     fexofenadine 180 MG tablet  Commonly known as:  ALLEGRA  Take 1 tablet (180 mg total) by mouth daily.     fluticasone 50 MCG/ACT nasal spray  Commonly known as:  FLONASE  Place 2 sprays into both nostrils daily.        Allergies:  Allergies  Allergen Reactions  . Milk-Related Compounds Diarrhea and Nausea And Vomiting  . Sulfa Antibiotics Hives and Itching    Family History: Family  History  Problem Relation Age of Onset  . Breast cancer Sister   . Leukemia Grandchild     granddaughter  . Dementia Sister   . Cataracts Sister   . Heart attack Brother   . CAD Brother   . Heart attack Brother   . Kidney disease Neg Hx   . Bladder Cancer Neg Hx     Social History:  reports that she quit smoking about 25 years ago. Her smoking use included Cigarettes. She smoked 0.25 packs per day. She quit smokeless tobacco use about 26 years ago. She reports that she drinks alcohol. She reports that she does not use illicit drugs.  ROS: UROLOGY Frequent Urination?: No Hard to postpone urination?: Yes Burning/pain with urination?: No Get up at night to urinate?: Yes Leakage of urine?: No Urine stream starts and stops?: No Trouble starting stream?: No Do you have to strain to urinate?: No Blood in urine?: No Urinary tract infection?: No Sexually transmitted disease?: No Injury to kidneys or bladder?: No Painful intercourse?: No Weak stream?: No Currently pregnant?: No Vaginal bleeding?: No Last menstrual period?: n  Gastrointestinal Nausea?: No Vomiting?: No Indigestion/heartburn?: No Diarrhea?: No Constipation?: No  Constitutional Fever: No Night sweats?: No Weight loss?: No Fatigue?: No  Skin Skin rash/lesions?: No Itching?: No  Eyes Blurred vision?: No Double vision?: No  Ears/Nose/Throat Sore throat?: No Sinus problems?: No  Hematologic/Lymphatic Swollen glands?: No Easy bruising?: No  Cardiovascular Leg swelling?: No Chest pain?: No  Respiratory Cough?: No Shortness of breath?: No  Endocrine Excessive thirst?: No  Musculoskeletal Back pain?: No Joint pain?: No  Neurological Headaches?: No Dizziness?: No  Psychologic Depression?: No Anxiety?: No  Physical Exam: BP 126/80 mmHg  Pulse 89  Ht 5\' 2"  (1.575 m)  Wt 154 lb 8 oz (70.081 kg)  BMI 28.25 kg/m2  Constitutional:  Alert and oriented, No acute distress. HEENT: [place] AT,  moist mucus membranes.  Trachea midline, no masses. Cardiovascular: No clubbing, cyanosis, or edema. Respiratory: Normal respiratory effort, no increased work of breathing. GI: Abdomen is soft, nontender, nondistended, no abdominal masses GU: No CVA tenderness.  Skin: No rashes, bruises or suspicious lesions. Lymph: No cervical or inguinal adenopathy. Neurologic: Grossly intact, no focal deficits, moving all 4 extremities. Psychiatric: Normal mood and affect.  Laboratory Data: Lab Results  Component Value Date   WBC 7.6 06/22/2014   HGB 13.6 06/22/2014   HCT 40.4 06/22/2014   MCV 91.8 06/22/2014   PLT 281 06/22/2014    Lab Results  Component Value Date   CREATININE 0.91 06/22/2014    No results found for: PSA  No results found for: TESTOSTERONE  No results found for: HGBA1C  Urinalysis  Component Value Date/Time   COLORURINE Yellow 01/09/2013 0647   APPEARANCEUR Hazy 01/09/2013 0647   LABSPEC 1.010 01/09/2013 0647   PHURINE 7.0 01/09/2013 0647   GLUCOSEU Negative 12/01/2014 1403   GLUCOSEU Negative 01/09/2013 0647   HGBUR Negative 01/09/2013 0647   BILIRUBINUR Negative 12/01/2014 1403   BILIRUBINUR negative 10/14/2014 1630   BILIRUBINUR Negative 01/09/2013 0647   KETONESUR Trace 01/09/2013 0647   PROTEINUR trace 10/14/2014 1630   PROTEINUR Negative 01/09/2013 0647   UROBILINOGEN 1.0 10/14/2014 1630   NITRITE Negative 12/01/2014 1403   NITRITE positive 10/14/2014 1630   NITRITE Negative 01/09/2013 0647   LEUKOCYTESUR 2+* 12/01/2014 1403   LEUKOCYTESUR large (3+)* 10/14/2014 1630   LEUKOCYTESUR 2+ 01/09/2013 0647    Assessment & Plan:     1. Recurrent UTI The patient has not had any symptomatic urinary tract infections or symptomatic UTIs in the last 6 months and has only had 2 or 3 in the last year. I do not think prophylactic antibiotics or estrogen cream are in her best interest in due the infrequency of the UTI and they are not leading to  hospitalization. She'll follow-up with [place] on an as-needed basis or if she starts developing more frequent urinary tract infections.     Return if symptoms worsen or fail to improve.  Nickie Retort, MD  [facility] [address], [place], [place] [address] [phone]

## 2015-03-27 ENCOUNTER — Encounter: Payer: Self-pay | Admitting: Physician Assistant

## 2015-03-27 ENCOUNTER — Ambulatory Visit (INDEPENDENT_AMBULATORY_CARE_PROVIDER_SITE_OTHER): Payer: Medicare Other | Admitting: Physician Assistant

## 2015-03-27 VITALS — BP 118/60 | HR 91 | Temp 98.2°F | Resp 16 | Wt 156.4 lb

## 2015-03-27 DIAGNOSIS — J0140 Acute pansinusitis, unspecified: Secondary | ICD-10-CM

## 2015-03-27 MED ORDER — AMOXICILLIN-POT CLAVULANATE 875-125 MG PO TABS: 1.0000 | ORAL_TABLET | Freq: Two times a day (BID) | ORAL | Status: DC

## 2015-03-27 NOTE — Patient Instructions (Signed)

## 2015-03-27 NOTE — Progress Notes (Signed)
Patient: Monica Singh    DOB: [birth year]   82 y.o.   MRN: [id number] Visit Date: 03/27/2015  Today's Provider: Mar Daring, [name]   Chief Complaint  Patient presents with  . Sore Throat  . Cough   Subjective:    Sore Throat  This is a new problem. The current episode started yesterday (Sunday Morning). The problem has been gradually worsening. The pain is worse on the right side. There has been no fever. Associated symptoms include congestion (chest congestion), coughing, headaches and shortness of breath. Pertinent negatives include no abdominal pain, ear discharge, ear pain, plugged ear sensation or vomiting. She has tried nothing for the symptoms. The treatment provided no relief.  Cough This is a new problem. The current episode started in the past 7 days (started on Friday). The problem has been gradually worsening. The problem occurs constantly. The cough is productive of sputum (Green Mucus). Associated symptoms include headaches, nasal congestion, postnasal drip, rhinorrhea and shortness of breath. Pertinent negatives include no chest pain, chills, ear congestion, ear pain, fever or wheezing. Nothing aggravates the symptoms. Treatments tried: Cough drops, whiskey, honey and lemon juice. The treatment provided no relief.      Allergies  Allergen Reactions  . Milk-Related Compounds Diarrhea and Nausea And Vomiting  . Sulfa Antibiotics Hives and Itching   Previous Medications   AMOXICILLIN-CLAVULANATE (AUGMENTIN) 875-125 MG TABLET    Take 1 tablet by mouth 2 (two) times daily.   DOCUSATE SODIUM (COLACE) 100 MG CAPSULE    Take 100 mg by mouth 1 day or 1 dose. Reported on 03/27/2015   FEXOFENADINE (ALLEGRA) 180 MG TABLET    Take 1 tablet (180 mg total) by mouth daily.   FLUTICASONE (FLONASE) 50 MCG/ACT NASAL SPRAY    Place 2 sprays into both nostrils daily.   MOMETASONE-FORMOTEROL (DULERA) 100-5 MCG/ACT AERO    Inhale into the lungs. Reported on 03/27/2015      Review of Systems  Constitutional: Negative for fever, chills, diaphoresis and fatigue.  HENT: Positive for congestion (chest congestion), postnasal drip, rhinorrhea, sinus pressure and sneezing. Negative for ear discharge and ear pain.   Respiratory: Positive for cough and shortness of breath. Negative for chest tightness and wheezing.   Cardiovascular: Negative for chest pain, palpitations and leg swelling.  Gastrointestinal: Negative for nausea, vomiting and abdominal pain.  Neurological: Positive for headaches. Negative for dizziness.    Social History  Substance Use Topics  . Smoking status: Former Smoker -- 0.25 packs/day    Types: Cigarettes    Quit date: 06/21/1989  . Smokeless tobacco: Former Systems developer    Quit date: 01/28/1989  . Alcohol Use: 0.0 oz/week    0 Standard drinks or equivalent per week     Comment: occassional wine   Objective:   BP 118/60 mmHg  Pulse 91  Temp(Src) 98.2 F (36.8 C)  Resp 16  Wt 156 lb 6.4 oz (70.943 kg)  SpO2 97%  Physical Exam  Constitutional: She appears well-developed and well-nourished. No distress.  HENT:  Head: Normocephalic and atraumatic.  Right Ear: Hearing, tympanic membrane, external ear and ear canal normal. No middle ear effusion.  Left Ear: Hearing, tympanic membrane, external ear and ear canal normal.  No middle ear effusion.  Nose: Mucosal edema present. No rhinorrhea. Right sinus exhibits maxillary sinus tenderness and frontal sinus tenderness. Left sinus exhibits maxillary sinus tenderness and frontal sinus tenderness.  Mouth/Throat: Uvula is midline and mucous membranes are  normal. Posterior oropharyngeal erythema present. No oropharyngeal exudate or posterior oropharyngeal edema.  Neck: Normal range of motion. Neck supple. No tracheal deviation present. No thyromegaly present.  Cardiovascular: Normal rate, regular rhythm and normal heart sounds.  Exam reveals no gallop and no friction rub.   No murmur  heard. Pulmonary/Chest: Effort normal and breath sounds normal. No stridor. No respiratory distress. She has no wheezes. She has no rales.  Lymphadenopathy:    She has no cervical adenopathy.  Skin: She is not diaphoretic.  Vitals reviewed.       Assessment & Plan:     1. Acute pansinusitis, recurrence not specified Worsening symptoms that have not responded to OTC medications. Will give augmentin as below.  She may take mucinex dm for congestion. Continue cough drops for cough. May take tylenol as needed for fevers and pain. Make sure to stay well hydrated and get plenty of rest. Call the office if no improvement or symptoms worsen. - amoxicillin-clavulanate (AUGMENTIN) 875-125 MG tablet; Take 1 tablet by mouth 2 (two) times daily.  Dispense: 20 tablet; Refill: 0       Mar Daring, [name]  [facility]

## 2015-03-30 DIAGNOSIS — Z96652 Presence of left artificial knee joint: Secondary | ICD-10-CM | POA: Diagnosis not present

## 2015-04-03 ENCOUNTER — Telehealth: Payer: Self-pay | Admitting: Family Medicine

## 2015-04-03 DIAGNOSIS — Z96652 Presence of left artificial knee joint: Secondary | ICD-10-CM | POA: Insufficient documentation

## 2015-04-03 DIAGNOSIS — J0140 Acute pansinusitis, unspecified: Secondary | ICD-10-CM

## 2015-04-03 MED ORDER — PREDNISONE 10 MG (21) PO TBPK: ORAL_TABLET | ORAL | Status: DC

## 2015-04-03 NOTE — Telephone Encounter (Signed)
Pt stated that she still has 2 days left of amoxicillin-clavulanate (AUGMENTIN) 875-125 MG tablet but doesn't think her symptoms have improved. Pt wanted to know if she should be feeling better by now or if she should try something else. [place]. Please advise. Thanks TNP

## 2015-04-03 NOTE — Telephone Encounter (Signed)
Patient advised as directed below.  Thanks,  -Chasen Mendell 

## 2015-04-03 NOTE — Telephone Encounter (Signed)
Will add prednisone to see if that helps with inflammation. And she should restart allergy medications if she has not since this could be prolonged due to allergy symptoms.

## 2015-05-13 DIAGNOSIS — Z87891 Personal history of nicotine dependence: Secondary | ICD-10-CM | POA: Diagnosis not present

## 2015-05-13 DIAGNOSIS — N3000 Acute cystitis without hematuria: Secondary | ICD-10-CM | POA: Diagnosis not present

## 2015-05-16 ENCOUNTER — Inpatient Hospital Stay: Payer: Medicare Other | Admitting: Oncology

## 2015-05-16 ENCOUNTER — Inpatient Hospital Stay: Payer: Medicare Other

## 2015-06-06 DIAGNOSIS — L570 Actinic keratosis: Secondary | ICD-10-CM | POA: Diagnosis not present

## 2015-06-06 DIAGNOSIS — L820 Inflamed seborrheic keratosis: Secondary | ICD-10-CM | POA: Diagnosis not present

## 2015-06-06 DIAGNOSIS — Z1283 Encounter for screening for malignant neoplasm of skin: Secondary | ICD-10-CM | POA: Diagnosis not present

## 2015-06-06 DIAGNOSIS — L812 Freckles: Secondary | ICD-10-CM | POA: Diagnosis not present

## 2015-06-06 DIAGNOSIS — Z85828 Personal history of other malignant neoplasm of skin: Secondary | ICD-10-CM | POA: Diagnosis not present

## 2015-06-06 DIAGNOSIS — I789 Disease of capillaries, unspecified: Secondary | ICD-10-CM | POA: Diagnosis not present

## 2015-06-20 ENCOUNTER — Other Ambulatory Visit: Payer: Self-pay | Admitting: *Deleted

## 2015-06-20 DIAGNOSIS — C8599 Non-Hodgkin lymphoma, unspecified, extranodal and solid organ sites: Secondary | ICD-10-CM

## 2015-06-21 ENCOUNTER — Inpatient Hospital Stay (HOSPITAL_BASED_OUTPATIENT_CLINIC_OR_DEPARTMENT_OTHER): Payer: Medicare Other | Admitting: Oncology

## 2015-06-21 ENCOUNTER — Inpatient Hospital Stay: Payer: Medicare Other

## 2015-06-21 ENCOUNTER — Inpatient Hospital Stay: Payer: Medicare Other | Attending: Oncology

## 2015-06-21 VITALS — BP 145/79 | HR 67 | Temp 95.3°F | Resp 18 | Wt 152.6 lb

## 2015-06-21 DIAGNOSIS — K219 Gastro-esophageal reflux disease without esophagitis: Secondary | ICD-10-CM | POA: Insufficient documentation

## 2015-06-21 DIAGNOSIS — I10 Essential (primary) hypertension: Secondary | ICD-10-CM | POA: Diagnosis not present

## 2015-06-21 DIAGNOSIS — Z87891 Personal history of nicotine dependence: Secondary | ICD-10-CM

## 2015-06-21 DIAGNOSIS — Z803 Family history of malignant neoplasm of breast: Secondary | ICD-10-CM

## 2015-06-21 DIAGNOSIS — Z8572 Personal history of non-Hodgkin lymphomas: Secondary | ICD-10-CM | POA: Diagnosis not present

## 2015-06-21 DIAGNOSIS — Z79899 Other long term (current) drug therapy: Secondary | ICD-10-CM | POA: Diagnosis not present

## 2015-06-21 DIAGNOSIS — Z9221 Personal history of antineoplastic chemotherapy: Secondary | ICD-10-CM

## 2015-06-21 DIAGNOSIS — C8599 Non-Hodgkin lymphoma, unspecified, extranodal and solid organ sites: Secondary | ICD-10-CM

## 2015-06-21 DIAGNOSIS — Z87440 Personal history of urinary (tract) infections: Secondary | ICD-10-CM

## 2015-06-21 LAB — CBC WITH DIFFERENTIAL/PLATELET
Basophils Absolute: 0.1 10*3/uL (ref 0–0.1)
Basophils Relative: 1 %
Eosinophils Absolute: 0.4 10*3/uL (ref 0–0.7)
Eosinophils Relative: 7 %
HCT: 42.4 % (ref 35.0–47.0)
Hemoglobin: 14.5 g/dL (ref 12.0–16.0)
Lymphocytes Relative: 16 %
Lymphs Abs: 1 10*3/uL (ref 1.0–3.6)
MCH: 31.5 pg (ref 26.0–34.0)
MCHC: 34.2 g/dL (ref 32.0–36.0)
MCV: 92.1 fL (ref 80.0–100.0)
Monocytes Absolute: 0.6 10*3/uL (ref 0.2–0.9)
Monocytes Relative: 10 %
Neutro Abs: 4.1 10*3/uL (ref 1.4–6.5)
Neutrophils Relative %: 66 %
Platelets: 293 10*3/uL (ref 150–440)
RBC: 4.61 MIL/uL (ref 3.80–5.20)
RDW: 12.7 % (ref 11.5–14.5)
WBC: 6.2 10*3/uL (ref 3.6–11.0)

## 2015-06-21 LAB — COMPREHENSIVE METABOLIC PANEL
ALT: 16 U/L (ref 14–54)
AST: 19 U/L (ref 15–41)
Albumin: 4.4 g/dL (ref 3.5–5.0)
Alkaline Phosphatase: 61 U/L (ref 38–126)
Anion gap: 7 (ref 5–15)
BUN: 15 mg/dL (ref 6–20)
CO2: 29 mmol/L (ref 22–32)
Calcium: 9.2 mg/dL (ref 8.9–10.3)
Chloride: 99 mmol/L — ABNORMAL LOW (ref 101–111)
Creatinine, Ser: 0.76 mg/dL (ref 0.44–1.00)
GFR calc Af Amer: >60 mL/min (ref >60)
GFR calc non Af Amer: >60 mL/min (ref >60)
Glucose, Bld: 103 mg/dL — ABNORMAL HIGH (ref 65–99)
Potassium: 4.1 mmol/L (ref 3.5–5.1)
Sodium: 135 mmol/L (ref 135–145)
Total Bilirubin: 0.7 mg/dL (ref 0.3–1.2)
Total Protein: 7.1 g/dL (ref 6.5–8.1)

## 2015-06-21 LAB — LACTATE DEHYDROGENASE: LDH: 131 U/L (ref 98–192)

## 2015-06-22 ENCOUNTER — Encounter: Payer: Self-pay | Admitting: Oncology

## 2015-06-22 NOTE — Progress Notes (Signed)
[place] @ [facility] [phone]  Fax:([phone]) [place]: [birth year]  MR#: [id number]  DHW#:[id number]  Patient Care Team: Margarita Rana, MD as PCP - General (Family Medicine) Margarita Rana, MD (Family Medicine)  CHIEF COMPLAINT:  Chief Complaint  Patient presents with  . Lymphoma   Subjective: Chief Complaint/Diagnosis:   Diffuse large cell lymphoma, stage 3,CD20 positive this post 6 cycles of chemotherapy with R-CHOP 2.stable adnexal mass being followed by Dr. Claiborne Rigg  No history exists.    No flowsheet data found.  INTERVAL HISTORY:  82 year old lady came today further follow-up regarding lymphoma. No abdominal pain no nausea no vomiting no diarrhea Getting regular mammograms done.  REVIEW OF SYSTEMS:   GENERAL:  Feels good.  Active.  No fevers, sweats or weight loss. PERFORMANCE STATUS (ECOG):  0 HEENT:  No visual changes, runny nose, sore throat, mouth sores or tenderness. Lungs: No shortness of breath or cough.  No hemoptysis. Cardiac:  No chest pain, palpitations, orthopnea, or PND. GI:  No nausea, vomiting, diarrhea, constipation, melena or hematochezia. GU:  No urgency, frequency, dysuria, or hematuria. Musculoskeletal:  No back pain.  No joint pain.  No muscle tenderness. Extremities:  No pain or swelling. Skin:  No rashes or skin changes. Neuro:  No headache, numbness or weakness, balance or coordination issues. Endocrine:  No diabetes, thyroid issues, hot flashes or night sweats. Psych:  No mood changes, depression or anxiety. Pain:  No focal pain. Review of systems:  All other systems reviewed and found to be negative. As per HPI. Otherwise, a complete review of systems is negatve.  PAST MEDICAL HISTORY: Past Medical History  Diagnosis Date  . Adnexal mass 06/03/2014    since 2009  . 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 HTN  (hypertension)   . GERD (gastroesophageal reflux disease)   . Recurrent UTI     PAST SURGICAL HISTORY: Past Surgical History  Procedure Laterality Date  . Portacath placement  2009  . Cervical neck fusion  1995  . Cataract extraction  1997    right eye  . Laparotomy with biopsy  03/02/2007  . Vaginal hysterectomy  1971  . Spine surgery  1997  . Dental surgery      screws  . Squamous cell carcinoma excision      right arm  . Total knee arthroplasty Right   . Abdominal surgery  2009    abdominal mass+ NH lymphoma,    FAMILY HISTORY Family History  Problem Relation Age of Onset  . Breast cancer Sister   . Leukemia Grandchild     granddaughter  . Dementia Sister   . Cataracts Sister   . Heart attack Brother   . CAD Brother   . Heart attack Brother   . Kidney disease Neg Hx   . Bladder Cancer Neg Hx     ADVANCED DIRECTIVES:  No flowsheet data found.  HEALTH MAINTENANCE: Social History  Substance Use Topics  . Smoking status: Former Smoker -- 0.25 packs/day    Types: Cigarettes    Quit date: 06/21/1989  . Smokeless tobacco: Former Systems developer    Quit date: 01/28/1989  . Alcohol Use: 0.0 oz/week    0 Standard drinks or equivalent per week     Comment: occassional wine      Allergies  Allergen Reactions  . Milk-Related Compounds Diarrhea and Nausea  And Vomiting  . Sulfa Antibiotics Hives and Itching    Current Outpatient Prescriptions  Medication Sig Dispense Refill  . docusate sodium (COLACE) 100 MG capsule Take 100 mg by mouth 1 day or 1 dose. Reported on 03/27/2015    . fexofenadine (ALLEGRA) 180 MG tablet Take 1 tablet (180 mg total) by mouth daily. 30 tablet 2  . fluticasone (FLONASE) 50 MCG/ACT nasal spray Place 2 sprays into both nostrils daily. 16 g 6  . mometasone-formoterol (DULERA) 100-5 MCG/ACT AERO Inhale into the lungs. Reported on 03/27/2015     No current facility-administered medications for this visit.    OBJECTIVE:  Filed Vitals:   06/21/15  1157  BP: 145/79  Pulse: 67  Temp: 95.3 F (35.2 C)  Resp: 18     Body mass index is 27.9 kg/(m^2).    ECOG FS:0 - Asymptomatic  PHYSICAL EXAM: GENERAL:  Well developed, well nourished, sitting comfortably in the exam room in no acute distress. MENTAL STATUS:  Alert and oriented to person, place and time.  ENT:  Oropharynx clear without lesion.  Tongue normal. Mucous membranes moist.  RESPIRATORY:  Clear to auscultation without rales, wheezes or rhonchi. CARDIOVASCULAR:  Regular rate and rhythm without murmur, rub or gallop.  ABDOMEN:  Soft, non-tender, with active bowel sounds, and no hepatosplenomegaly.  No masses. BACK:  No CVA tenderness.  No tenderness on percussion of the back or rib cage. SKIN:  No rashes, ulcers or lesions. EXTREMITIES: No edema, no skin discoloration or tenderness.  No palpable cords. LYMPH NODES: No palpable cervical, supraclavicular, axillary or inguinal adenopathy  NEUROLOGICAL: Unremarkable. PSYCH:  Appropriate.   LAB RESULTS:  CBC Latest Ref Rng 06/21/2015 06/22/2014  WBC 3.6 - 11.0 K/uL 6.2 7.6  Hemoglobin 12.0 - 16.0 g/dL 14.5 13.6  Hematocrit 35.0 - 47.0 % 42.4 40.4  Platelets 150 - 440 K/uL 293 281    Appointment on 06/21/2015  Component Date Value Ref Range Status  . WBC 06/21/2015 6.2  3.6 - 11.0 K/uL Final  . RBC 06/21/2015 4.61  3.80 - 5.20 MIL/uL Final  . Hemoglobin 06/21/2015 14.5  12.0 - 16.0 g/dL Final  . HCT 06/21/2015 42.4  35.0 - 47.0 % Final  . MCV 06/21/2015 92.1  80.0 - 100.0 fL Final  . MCH 06/21/2015 31.5  26.0 - 34.0 pg Final  . MCHC 06/21/2015 34.2  32.0 - 36.0 g/dL Final  . RDW 06/21/2015 12.7  11.5 - 14.5 % Final  . Platelets 06/21/2015 293  150 - 440 K/uL Final  . Neutrophils Relative % 06/21/2015 66   Final  . Neutro Abs 06/21/2015 4.1  1.4 - 6.5 K/uL Final  . Lymphocytes Relative 06/21/2015 16   Final  . Lymphs Abs 06/21/2015 1.0  1.0 - 3.6 K/uL Final  . Monocytes Relative 06/21/2015 10   Final  . Monocytes  Absolute 06/21/2015 0.6  0.2 - 0.9 K/uL Final  . Eosinophils Relative 06/21/2015 7   Final  . Eosinophils Absolute 06/21/2015 0.4  0 - 0.7 K/uL Final  . Basophils Relative 06/21/2015 1   Final  . Basophils Absolute 06/21/2015 0.1  0 - 0.1 K/uL Final  . Sodium 06/21/2015 135  135 - 145 mmol/L Final  . Potassium 06/21/2015 4.1  3.5 - 5.1 mmol/L Final  . Chloride 06/21/2015 99* 101 - 111 mmol/L Final  . CO2 06/21/2015 29  22 - 32 mmol/L Final  . Glucose, Bld 06/21/2015 103* 65 - 99 mg/dL Final  . BUN 06/21/2015  15  6 - 20 mg/dL Final  . Creatinine, Ser 06/21/2015 0.76  0.44 - 1.00 mg/dL Final  . Calcium 06/21/2015 9.2  8.9 - 10.3 mg/dL Final  . Total Protein 06/21/2015 7.1  6.5 - 8.1 g/dL Final  . Albumin 06/21/2015 4.4  3.5 - 5.0 g/dL Final  . AST 06/21/2015 19  15 - 41 U/L Final  . ALT 06/21/2015 16  14 - 54 U/L Final  . Alkaline Phosphatase 06/21/2015 61  38 - 126 U/L Final  . Total Bilirubin 06/21/2015 0.7  0.3 - 1.2 mg/dL Final  . GFR calc non Af Amer 06/21/2015 >60  >60 mL/min Final  . GFR calc Af Amer 06/21/2015 >60  >60 mL/min Final   Comment: (NOTE) The eGFR has been calculated using the CKD EPI equation. This calculation has not been validated in all clinical situations. eGFR's persistently <60 mL/min signify possible Chronic Kidney Disease.   . Anion gap 06/21/2015 7  5 - 15 Final  . LDH 06/21/2015 131  98 - 192 U/L Final       STUDIES: No results found.  ASSESSMENT:   MEDICAL DECISION MAKING:  Diffuse B large cell lymphoma status post 6 cycles of chemotherapy with CHOP. Clinical drown there is no evidence of recurrent or progressive disease.  Yearly checkup recommended for 2 more years. Lab data has been reviewed..  Patient is aware of my retired treatment plan and will be evaluated by Dr. B in my absence Patient expressed understanding and was in agreement with this plan. She also understands that She can call clinic at any time with any questions, concerns, or  complaints.    No matching staging information was found for the patient.  Forest Gleason, MD   06/22/2015 9:19 AM

## 2015-06-24 ENCOUNTER — Encounter: Payer: Self-pay | Admitting: Oncology

## 2015-07-03 ENCOUNTER — Encounter: Payer: Self-pay | Admitting: Oncology

## 2015-07-05 ENCOUNTER — Ambulatory Visit
Admission: RE | Admit: 2015-07-05 | Discharge: 2015-07-05 | Disposition: A | Discharge status: Home / Self Care | Payer: Medicare Other | Source: Ambulatory Visit | Attending: Family Medicine | Admitting: Family Medicine

## 2015-07-05 ENCOUNTER — Ambulatory Visit (INDEPENDENT_AMBULATORY_CARE_PROVIDER_SITE_OTHER): Payer: Medicare Other | Admitting: Family Medicine

## 2015-07-05 ENCOUNTER — Encounter: Payer: Self-pay | Admitting: Family Medicine

## 2015-07-05 VITALS — BP 138/70 | HR 96 | Temp 97.9°F | Resp 20 | Wt 158.0 lb

## 2015-07-05 DIAGNOSIS — J441 Chronic obstructive pulmonary disease with (acute) exacerbation: Secondary | ICD-10-CM

## 2015-07-05 DIAGNOSIS — R0602 Shortness of breath: Secondary | ICD-10-CM

## 2015-07-05 DIAGNOSIS — R938 Abnormal findings on diagnostic imaging of other specified body structures: Secondary | ICD-10-CM | POA: Diagnosis not present

## 2015-07-05 MED ORDER — LEVALBUTEROL HCL 0.63 MG/3ML IN NEBU
0.6300 mg | INHALATION_SOLUTION | Freq: Once | RESPIRATORY_TRACT | Status: AC
  Administered 2015-07-05: 0.63 mg via RESPIRATORY_TRACT

## 2015-07-05 MED ORDER — PREDNISONE 10 MG PO TABS: ORAL_TABLET | ORAL | Status: DC

## 2015-07-05 MED ORDER — ALBUTEROL SULFATE HFA 108 (90 BASE) MCG/ACT IN AERS: 2.0000 | INHALATION_SPRAY | Freq: Four times a day (QID) | RESPIRATORY_TRACT | Status: DC | PRN

## 2015-07-05 MED ORDER — AZITHROMYCIN 250 MG PO TABS: ORAL_TABLET | ORAL | Status: DC
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
## 2015-07-06 ENCOUNTER — Telehealth: Payer: Self-pay

## 2015-07-06 NOTE — Telephone Encounter (Signed)
-----   Message from Margarita Rana, MD sent at 07/05/2015  7:05 PM EDT ----- Normal CXR. Please notify patient. Thanks.

## 2015-07-06 NOTE — Telephone Encounter (Signed)
Pt advised.   Thanks,   -Lakethia Coppess  

## 2015-07-10 ENCOUNTER — Encounter: Payer: Self-pay | Admitting: Family Medicine

## 2015-07-10 ENCOUNTER — Ambulatory Visit (INDEPENDENT_AMBULATORY_CARE_PROVIDER_SITE_OTHER): Payer: Medicare Other | Admitting: Family Medicine

## 2015-07-10 VITALS — BP 126/56 | HR 92 | Temp 98.0°F | Resp 20

## 2015-07-10 DIAGNOSIS — IMO0002 Reserved for concepts with insufficient information to code with codable children: Secondary | ICD-10-CM

## 2015-07-10 DIAGNOSIS — T148 Other injury of unspecified body region: Secondary | ICD-10-CM

## 2015-07-10 NOTE — Progress Notes (Signed)
Subjective:    [name], female    DOB: 20-Jan-1929, 82 y.o.   MRN: [id number]  HPI 82 yo female here for suture removal.   Have been in for 10 days.   No infection, no drainage, no erythema.  Feels well.   Has healed up without any problem. Is having a hard time keeping a bandage on it.    Patient Active Problem List   Diagnosis Date Noted  . H/O total knee replacement 04/03/2015  . Medicare annual wellness visit, subsequent 10/12/2014  . Abnormal chest sounds 08/25/2014  . Allergic rhinitis 08/25/2014  . Colon polyp 08/25/2014  . Acute exacerbation of chronic obstructive airways disease ([place]) 08/25/2014  . DDD (degenerative disc disease), cervical 08/25/2014  . DDD (degenerative disc disease), lumbar 08/25/2014  . H/O non-Hodgkin's lymphoma 08/25/2014  . Below normal amount of sodium in the blood 08/25/2014  . Arthritis of knee, degenerative 08/25/2014  . Subclinical hypothyroidism 08/25/2014  . Ovarian cyst 06/22/2014  . Adnexal mass 06/03/2014  . Herpes zoster without complication [id number]  . Lymphoma of small bowel ([place]) 03/11/2008  . Current tobacco use 03/11/2008   Past Medical History  Diagnosis Date  . Adnexal mass 06/03/2014    since 2009  . 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 HTN (hypertension)   . GERD (gastroesophageal reflux disease)   . Recurrent UTI    Current Outpatient Prescriptions on File Prior to Visit  Medication Sig  . albuterol (PROVENTIL HFA;VENTOLIN HFA) 108 (90 Base) MCG/ACT inhaler Inhale 2 puffs into the lungs every 6 (six) hours as needed for wheezing or shortness of breath.  . docusate sodium (COLACE) 100 MG capsule Take 100 mg by mouth 1 day or 1 dose. Reported on 03/27/2015  . fexofenadine (ALLEGRA) 180 MG tablet Take 1 tablet (180 mg total) by mouth daily.  . fluticasone (FLONASE) 50 MCG/ACT nasal spray Place 2 sprays into both  nostrils daily.  . mometasone-formoterol (DULERA) 100-5 MCG/ACT AERO Inhale into the lungs. Reported on 03/27/2015  . predniSONE (DELTASONE) 10 MG tablet 6 po for 2 days and then 5 po for 2 days and then 4 po for 2 days and 3 po for 2 days and then 2 po for 2 days and then 1 po for 2 days.   No current facility-administered medications on file prior to visit.   Allergies  Allergen Reactions  . Milk-Related Compounds Diarrhea and Nausea And Vomiting  . Sulfa Antibiotics Hives and Itching   Past Surgical History  Procedure Laterality Date  . Portacath placement  2009  . Cervical neck fusion  1995  . Cataract extraction  1997    right eye  . Laparotomy with biopsy  03/02/2007  . Vaginal hysterectomy  1971  . Spine surgery  1997  . Dental surgery      screws  . Squamous cell carcinoma excision      right arm  . Total knee arthroplasty Right   . Abdominal surgery  2009    abdominal mass+ NH lymphoma,   Social History   Social History  . Marital Status: Widowed    Spouse Name: N/A  . Number of Children: N/A  . Years of Education: N/A   Occupational History  . Not on file.   Social History Main Topics  . Smoking status: Former Smoker -- 0.25 packs/day    Types: Cigarettes  Quit date: 06/21/1989  . Smokeless tobacco: Former Systems developer    Quit date: 01/28/1989  . Alcohol Use: 0.0 oz/week    0 Standard drinks or equivalent per week     Comment: occassional wine  . Drug Use: No  . Sexual Activity: No   Other Topics Concern  . Not on file   Social History Narrative   Review of Systems  Constitutional: Negative for fever, chills, diaphoresis, activity change, appetite change, fatigue and unexpected weight change.  Skin: Positive for wound.      Objective:   Physical Exam  Constitutional: She is oriented to person, place, and time. She appears well-developed and well-nourished.  Musculoskeletal: Normal range of motion.  Neurological: She is alert and oriented to person,  place, and time.  Skin: Skin is warm and dry.  Laceration on back on left hand. Well healing. 4 sutures removed today. Difficult to remove because so small.    Psychiatric: She has a normal mood and affect. Her behavior is normal. Judgment and thought content normal.      Assessment & Plan:  1. Laceration Improved. 4 sutures removed at ov today.  Patient tolerated procedure well.   Follow up as needed.   Margarita Rana, MD

## 2015-07-26 ENCOUNTER — Inpatient Hospital Stay: Payer: Medicare Other | Attending: Obstetrics and Gynecology | Admitting: Obstetrics and Gynecology

## 2015-07-26 ENCOUNTER — Other Ambulatory Visit: Payer: Self-pay

## 2015-07-26 ENCOUNTER — Encounter: Payer: Self-pay | Admitting: Obstetrics and Gynecology

## 2015-07-26 VITALS — BP 146/77 | HR 88 | Temp 97.8°F | Ht 62.0 in | Wt 156.3 lb

## 2015-07-26 DIAGNOSIS — Z8572 Personal history of non-Hodgkin lymphomas: Secondary | ICD-10-CM | POA: Diagnosis not present

## 2015-07-26 DIAGNOSIS — N83209 Unspecified ovarian cyst, unspecified side: Secondary | ICD-10-CM

## 2015-07-26 DIAGNOSIS — J309 Allergic rhinitis, unspecified: Secondary | ICD-10-CM | POA: Insufficient documentation

## 2015-07-26 DIAGNOSIS — M5030 Other cervical disc degeneration, unspecified cervical region: Secondary | ICD-10-CM | POA: Diagnosis not present

## 2015-07-26 DIAGNOSIS — J449 Chronic obstructive pulmonary disease, unspecified: Secondary | ICD-10-CM | POA: Insufficient documentation

## 2015-07-26 DIAGNOSIS — M5136 Other intervertebral disc degeneration, lumbar region: Secondary | ICD-10-CM | POA: Diagnosis not present

## 2015-07-26 DIAGNOSIS — E039 Hypothyroidism, unspecified: Secondary | ICD-10-CM | POA: Insufficient documentation

## 2015-07-26 DIAGNOSIS — N83202 Unspecified ovarian cyst, left side: Secondary | ICD-10-CM | POA: Diagnosis not present

## 2015-07-26 DIAGNOSIS — Z9221 Personal history of antineoplastic chemotherapy: Secondary | ICD-10-CM | POA: Insufficient documentation

## 2015-07-26 DIAGNOSIS — Z90710 Acquired absence of both cervix and uterus: Secondary | ICD-10-CM

## 2015-07-26 DIAGNOSIS — I10 Essential (primary) hypertension: Secondary | ICD-10-CM | POA: Insufficient documentation

## 2015-07-26 DIAGNOSIS — Z87891 Personal history of nicotine dependence: Secondary | ICD-10-CM | POA: Insufficient documentation

## 2015-07-26 DIAGNOSIS — Z96659 Presence of unspecified artificial knee joint: Secondary | ICD-10-CM | POA: Diagnosis not present

## 2015-07-26 DIAGNOSIS — K219 Gastro-esophageal reflux disease without esophagitis: Secondary | ICD-10-CM | POA: Insufficient documentation

## 2015-07-26 DIAGNOSIS — B029 Zoster without complications: Secondary | ICD-10-CM | POA: Diagnosis not present

## 2015-07-26 NOTE — Progress Notes (Signed)
Gynecologic Oncology Interval Note  Referring MD: Dr. Forest Gleason   PCP [id number] Venia Minks, [place], [place] [address] [phone]  Chief Complaint: Ovarian cyst surveillance.   Subjective:  Monica Singh is a 82 y.o. woman.  Patient presents today for continued surveillance for history of ovarian cyst. She is doing well today. Her only complaints is SOB and she is seeing Dr. Venia Minks for this issue.  Her ultrasound 06/13/2014 compared to 11/26/2013 and 10/08/2012  was reasssuring as noted below. Ultrasound is still pending. CA125 values have been normal.    Lab Results  Component Value Date   CA125 11.7 06/22/2014    Oncology Treatment History:  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 She had been seen by Dr. Sabra Heck for several years for an adnexal mass has been present since 2009. She is status post hysterectomy. Her history is as follows:  CA-125 on 01/2011 was 12.4  03/01/2011 she had an ultrasound compared to 2011. Within the left neck for region there is a cystic mass measuring 2.1 x 3.98 x 3.2 cm. What appeared to be the ovary included a cyst measuring 4.3 x 4.32 x 3.32 cm.   CA-125 on 03/2011 was 13.0  Repeat ultrasound 09/03/2011 the left ovary measured 4.2 x 3.1 x 3.4 cm. The cystic mass measured 3.2 x 2.5 x 2.9 cm. The cystic mass was relatively anechoic without a dominant area of nodularity. There may be mild thickening of the wall but similar to prior.   CA-125 on 09/2011 was 11.3.   Ultrasound on 10/08/2012 on the left ovary demonstrated and measured 4.5 x 4.1 x 3.5 cm and contained a 4 x 3.2 x 3.7 cm cystic area there is persistent solid and cystic adnexal process presumed to reflect ovary and associated ovarian cyst.  Her ultrasound 06/13/2014 compared to 11/26/2013 and 10/08/2012  was reasssuring as noted below. Her CA125 was 11.7.  Measurements: 4.5 x 3.9 x 3.6  cm. There is an anechoic structure measuring 3.6 x 3.6 x 3.4 cm which is slightly smaller than on the previous study. Previous dimensions of this anechoic structure were 4.4 x 3.4 x 3.4 cm  IMPRESSION: 1. Slight interval decrease in the size of the hypoechoic slightly irregularly marginated left ovarian cystic structure. 2. The right ovary could not be demonstrated. The uterus is surgically absent.   Problem List: Patient Active Problem List   Diagnosis Date Noted  . Cyst of ovary 07/26/2015  . Laceration 07/10/2015  . H/O total knee replacement 04/03/2015  . Medicare annual wellness visit, subsequent 10/12/2014  . Abnormal chest sounds 08/25/2014  . Allergic rhinitis 08/25/2014  . Colon polyp 08/25/2014  . Acute exacerbation of chronic obstructive airways disease ([place]) 08/25/2014  . DDD (degenerative disc disease), cervical 08/25/2014  . DDD (degenerative disc disease), lumbar 08/25/2014  . H/O non-Hodgkin's lymphoma 08/25/2014  . Below normal amount of sodium in the blood 08/25/2014  . Arthritis of knee, degenerative 08/25/2014  . Subclinical hypothyroidism 08/25/2014  . Ovarian cyst 06/22/2014  . Adnexal mass 06/03/2014  . Herpes zoster without complication [id number]  . Lymphoma of small bowel ([place]) 03/11/2008  . Current tobacco use 03/11/2008    Past Medical History: Past Medical History  Diagnosis Date  . Adnexal mass 06/03/2014    since 2009  . 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 HTN (hypertension)   . GERD (gastroesophageal reflux disease)   . Recurrent UTI     Past Surgical History: Past Surgical History  Procedure Laterality Date  . Portacath placement  2009  . Cervical neck fusion  1995  . Cataract extraction  1997    right eye  . Laparotomy with biopsy  03/02/2007  . Vaginal hysterectomy  1971  . Spine surgery  1997  . Dental surgery      screws  . Squamous cell  carcinoma excision      right arm  . Total knee arthroplasty Right   . Abdominal surgery  2009    abdominal mass+ NH lymphoma,    Family History: Family History  Problem Relation Age of Onset  . Breast cancer Sister   . Leukemia Grandchild     granddaughter  . Dementia Sister   . Cataracts Sister   . Heart attack Brother   . CAD Brother   . Heart attack Brother   . Kidney disease Neg Hx   . Bladder Cancer Neg Hx     Social History: Social History   Social History  . Marital Status: Widowed    Spouse Name: N/A  . Number of Children: N/A  . Years of Education: N/A   Occupational History  . Not on file.   Social History Main Topics  . Smoking status: Former Smoker -- 0.25 packs/day    Types: Cigarettes    Quit date: 06/21/1989  . Smokeless tobacco: Former Systems developer    Quit date: 01/28/1989  . Alcohol Use: 0.0 oz/week    0 Standard drinks or equivalent per week     Comment: occassional wine  . Drug Use: No  . Sexual Activity: No   Other Topics Concern  . Not on file   Social History Narrative    Allergies: Allergies  Allergen Reactions  . Milk-Related Compounds Diarrhea and Nausea And Vomiting  . Sulfa Antibiotics Hives and Itching    Current Medications: Current Outpatient Prescriptions  Medication Sig Dispense Refill  . albuterol (PROVENTIL HFA;VENTOLIN HFA) 108 (90 Base) MCG/ACT inhaler Inhale 2 puffs into the lungs every 6 (six) hours as needed for wheezing or shortness of breath. 1 Inhaler 2  . docusate sodium (COLACE) 100 MG capsule Take 100 mg by mouth 1 day or 1 dose. Reported on 03/27/2015    . fexofenadine (ALLEGRA) 180 MG tablet Take 1 tablet (180 mg total) by mouth daily. 30 tablet 2  . fluticasone (FLONASE) 50 MCG/ACT nasal spray Place 2 sprays into both nostrils daily. 16 g 6   No current facility-administered medications for this visit.      Review of Systems A comprehensive review of systems was negative.  General: no complaints  HEENT:  no complaints  Lungs: shortness of breath  Cardiac: no complaints  GI: no complaints  GU: no complaints  Musculoskeletal: no complaints  Extremities: no complaints  Skin: no complaints  Neuro: no complaints  Endocrine: no complaints  Psych: no complaints      Objective:  BP 146/77 mmHg  Pulse 88  Temp(Src) 97.8 F (36.6 C) (Tympanic)  Ht 5\' 2"  (1.575 m)  Wt 156 lb 4.9 oz (70.9 kg)  BMI 28.58 kg/m2   ECOG Performance Status: 0 - Asymptomatic  General appearance: alert, cooperative and appears stated age HEENT: ATNC Abdomen:no palpable masses, no hernias, soft, nontender, nondistended, no evidence of ascites.  Extremities: no lower extremity edema Neurological  exam reveals alert, oriented, normal speech, no focal findings or movement disorder noted  Pelvic: Vulva: normal appearing vulva with no masses, tenderness or lesions; Vagina: normal vagina; Adnexa: normal adnexa in size, nontender and no masses; Uterus: surgically absent, vaginal cuff well healed; Cervix: absent; Rectal: not performed  Lab Review  Lab Results  Component Value Date   CA125 11.7 06/22/2014   CA125 11.1 11/24/2013   CA125 11.3 10/14/2011    Assessment:  Monica Singh is a 82 y.o. female with an ovarian cyst, asymptomatic and decreased in size.    Plan:   Problem List Items Addressed This Visit      Genitourinary   Cyst of ovary - Primary      We will obtain Ultrasound today. The patient would like to avoid the endovaginal probe, which I think is okay as long as the cyst can be evaluated transabdominally. She understands that if the cyst can not be fully visualized that way then the endovaginal approach will also be used. Obtain CA-125 if cyst changes in character or enlarges on imaging. Repeat pelvic ultrasound and exam in 12 months.    Suggested return to clinic in  12 months.  Gillis Ends, MD   ADDENDUM: Based on a review I found the authors said the following, "A  recent expert review suggested that low-risk abnormalities can undergo an initial 52-month follow-up, with those that remain stable or decreasing in size being examined every 12 months for 5 years.[40]"  Eulah Citizen RW. Evaluation and management of ultrasonographically detected ovarian tumors in asymptomatic women. Obstet Gynecol [id number].  CrossRef   PubMed   Web of Science  Plan if patient ultrasound stable she can be released from clinic. We will advise RTC if she develops concerning symptoms.  Gillis Ends, MD   CC:  Margarita Rana, MD [address] [place], [place] [address] [phone]

## 2015-07-26 NOTE — Progress Notes (Signed)
  Oncology Nurse Navigator Documentation  Navigator Location: [facility] (07/26/15 1400) Navigator Encounter Type: Clinic/MDC (one year) (07/26/15 1400)                                          Time Spent with Patient: 30 (07/26/15 1400)   Chaperoned pelvic exam. Needs abdominal u/s that was previously ordered now and a repeat in one year.

## 2015-08-04 ENCOUNTER — Ambulatory Visit
Admission: RE | Admit: 2015-08-04 | Discharge: 2015-08-04 | Disposition: A | Discharge status: Home / Self Care | Payer: Medicare Other | Source: Ambulatory Visit | Attending: Obstetrics and Gynecology | Admitting: Obstetrics and Gynecology

## 2015-08-04 DIAGNOSIS — N83209 Unspecified ovarian cyst, unspecified side: Secondary | ICD-10-CM

## 2015-08-04 DIAGNOSIS — N83202 Unspecified ovarian cyst, left side: Secondary | ICD-10-CM | POA: Diagnosis not present

## 2015-08-14 ENCOUNTER — Encounter: Payer: Self-pay | Admitting: Obstetrics and Gynecology

## 2015-08-14 NOTE — Progress Notes (Signed)
  Oncology Nurse Navigator Documentation  Navigator Location: [facility] (08/14/15 1500) Navigator Encounter Type: Letter/Fax/Email (08/14/15 1500)                                          Time Spent with Patient: 15 (08/14/15 1500)   Letter regarding U/S results from Dr Theora Gianotti mailed out.

## 2015-10-05 ENCOUNTER — Telehealth: Payer: Self-pay

## 2015-10-05 NOTE — Telephone Encounter (Signed)
  Oncology Nurse Navigator Documentation  Navigator Location: [facility] (10/05/15 1100) Navigator Encounter Type: Telephone (regarding follow up) (10/05/15 1100) Telephone: Outgoing Call (10/05/15 1100)                                        Time Spent with Patient: 15 (10/05/15 1100)   Voicemail left for Monica Singh to return call. We have not heard from her regarding follow up with U/S in one year or notifying us if any new problems or symproms arise.

## 2015-10-16 ENCOUNTER — Telehealth: Payer: Self-pay

## 2015-10-16 NOTE — Telephone Encounter (Signed)
  Oncology Nurse Navigator Documentation Received call from Ms. Martorano. She describes 4 episodes of vomiting since 6/28 visit with Dr Theora Gianotti. She desires to have U/S  6/18 and will decide on further follow up after that. Scheduling notified to arrange. Order already in [place]. Navigator Location: [facility] (10/16/15 1100) Navigator Encounter Type: Telephone (10/16/15 1100) Telephone: Incoming Call;Patient Update (10/16/15 1100)                                        Time Spent with Patient: 15 (10/16/15 1100)

## 2015-10-24 ENCOUNTER — Ambulatory Visit: Payer: Medicare Other | Admitting: Family Medicine

## 2015-11-03 ENCOUNTER — Ambulatory Visit: Payer: Medicare Other | Admitting: Family Medicine

## 2015-11-20 ENCOUNTER — Encounter: Payer: Self-pay | Admitting: Family Medicine

## 2015-11-20 ENCOUNTER — Ambulatory Visit (INDEPENDENT_AMBULATORY_CARE_PROVIDER_SITE_OTHER): Payer: Medicare Other | Admitting: Family Medicine

## 2015-11-20 VITALS — BP 120/64 | HR 89 | Temp 97.9°F | Resp 16 | Ht 62.0 in | Wt 155.0 lb

## 2015-11-20 DIAGNOSIS — Z23 Encounter for immunization: Secondary | ICD-10-CM | POA: Diagnosis not present

## 2015-11-20 DIAGNOSIS — M810 Age-related osteoporosis without current pathological fracture: Secondary | ICD-10-CM | POA: Diagnosis not present

## 2015-11-20 DIAGNOSIS — J449 Chronic obstructive pulmonary disease, unspecified: Secondary | ICD-10-CM | POA: Diagnosis not present

## 2015-11-20 MED ORDER — RISEDRONATE SODIUM 35 MG PO TABS: 35.0000 mg | ORAL_TABLET | ORAL | 12 refills | Status: DC

## 2015-11-20 NOTE — Patient Instructions (Signed)
Start taking OTC Oscal-D twice a day with food.   Osteoporosis Osteoporosis is the thinning and loss of density in the bones. Osteoporosis makes the bones more brittle, fragile, and likely to break (fracture). Over time, osteoporosis can cause the bones to become so weak that they fracture after a simple fall. The bones most likely to fracture are the bones in the hip, wrist, and spine. CAUSES  The exact cause is not known. RISK FACTORS Anyone can develop osteoporosis. You may be at greater risk if you have a family history of the condition or have poor nutrition. You may also have a higher risk if you are:   Female.   80 years old or older.  A smoker.  Not physically active.   White or Asian.  Slender. SIGNS AND SYMPTOMS  A fracture might be the first sign of the disease, especially if it results from a fall or injury that would not usually cause a bone to break. Other signs and symptoms include:   Low back and neck pain.  Stooped posture.  Height loss. DIAGNOSIS  To make a diagnosis, your health care provider may:  Take a medical history.  Perform a physical exam.  Order tests, such as:  A bone mineral density test.  A dual-energy X-ray absorptiometry test. TREATMENT  The goal of osteoporosis treatment is to strengthen your bones to reduce your risk of a fracture. Treatment may involve:  Making lifestyle changes, such as:  Eating a diet rich in calcium.  Doing weight-bearing and muscle-strengthening exercises.  Stopping tobacco use.  Limiting alcohol intake.  Taking medicine to slow the process of bone loss or to increase bone density.  Monitoring your levels of calcium and vitamin D. HOME CARE INSTRUCTIONS  Include calcium and vitamin D in your diet. Calcium is important for bone health, and vitamin D helps the body absorb calcium.  Perform weight-bearing and muscle-strengthening exercises as directed by your health care provider.  Do not use any  tobacco products, including cigarettes, chewing tobacco, and electronic cigarettes. If you need help quitting, ask your health care provider.  Limit your alcohol intake.  Take medicines only as directed by your health care provider.  Keep all follow-up visits as directed by your health care provider. This is important.  Take precautions at home to lower your risk of falling, such as:  Keeping rooms well lit and clutter free.  Installing safety rails on stairs.  Using rubber mats in the bathroom and other areas that are often wet or slippery. SEEK IMMEDIATE MEDICAL CARE IF:  You fall or injure yourself.    This information is not intended to replace advice given to you by your health care provider. Make sure you discuss any questions you have with your health care provider.   Document Released: 10/24/2004 Document Revised: 02/04/2014 Document Reviewed: 06/24/2013 Elsevier Interactive Patient Education [employer].

## 2015-11-20 NOTE — Progress Notes (Signed)
Patient: Monica Singh    DOB: 08/22/1928   82 y.o.   MRN: [id number] Visit Date: 11/20/2015  Today's Provider: Lelon Huh, MD   Chief Complaint  Patient presents with  . Follow-up  . Shortness of Breath   Subjective:     This is a previous patient of Dr. Venia Minks present today as new patient to me to establish care and follow up on chronic medical problems.   Follow up COPD.  Last seen with exacerbation 07-05-15. Had spirometry at that time showing moderate obstruction. Uses albuterol inhaler prn. Usually only one puff since two makes her feel jumpy. Only requiring inhaler 1-2 times a week. She has remote smoking history, quit in 1991.   Follow up osteoporosis. Last BMD in February 2016 and prescribed alendronate. Patient doesn't recall fill prescription, but states she took Fosamax in the past and it didn't do anything for her. She does not take any calcium of vitamin D supplements.       Allergies  Allergen Reactions  . Milk-Related Compounds Diarrhea and Nausea And Vomiting  . Sulfa Antibiotics Hives and Itching     Current Outpatient Prescriptions:  .  albuterol (PROVENTIL HFA;VENTOLIN HFA) 108 (90 Base) MCG/ACT inhaler, Inhale 2 puffs into the lungs every 6 (six) hours as needed for wheezing or shortness of breath., Disp: 1 Inhaler, Rfl: 2 .  docusate sodium (COLACE) 100 MG capsule, Take 100 mg by mouth 1 day or 1 dose. Reported on 03/27/2015, Disp: , Rfl:  .  fexofenadine (ALLEGRA) 180 MG tablet, Take 1 tablet (180 mg total) by mouth daily., Disp: 30 tablet, Rfl: 2 .  fluticasone (FLONASE) 50 MCG/ACT nasal spray, Place 2 sprays into both nostrils daily., Disp: 16 g, Rfl: 6  Review of Systems  Constitutional: Negative for appetite change, chills, fatigue and fever.  Respiratory: Positive for shortness of breath and wheezing. Negative for chest tightness.   Cardiovascular: Negative for chest pain and palpitations.  Gastrointestinal: Negative for  abdominal pain, nausea and vomiting.  Neurological: Negative for dizziness and weakness.    Social History  Substance Use Topics  . Smoking status: Former Smoker    Packs/day: 0.25    Types: Cigarettes    Quit date: 06/21/1989  . Smokeless tobacco: Former Systems developer    Quit date: 01/28/1989  . Alcohol use 0.0 oz/week     Comment: occassional wine   Objective:   BP 120/64 (BP Location: Right Arm, Patient Position: Sitting, Cuff Size: Normal)   Pulse 89   Temp 97.9 F (36.6 C) (Oral)   Resp 16   Ht 5\' 2"  (1.575 m)   Wt 155 lb (70.3 kg)   SpO2 97%   BMI 28.35 kg/m   Physical Exam   General Appearance:    Alert, cooperative, no distress  Eyes:    PERRL, conjunctiva/corneas clear, EOM's intact       Lungs:     Clear to auscultation bilaterally, respirations unlabored  Heart:    Regular rate and rhythm  Neurologic:   Awake, alert, oriented x 3. No apparent focal neurological           defect.           Assessment & Plan:     1. Chronic obstructive pulmonary disease, unspecified COPD type ([place]) Doing well with prn albuterol inhaler which she only requires 1-2 times per week.   2. Osteoporosis, unspecified osteoporosis type, unspecified pathological fracture presence She states Fosamax didn't  work for her, although it seems to have been many years since she took it.  Rx risedronate 35mg  weekly and advised she needs to start taking calcium with vitamin D supplement. Counseled on health risks of untreated osteoporosis.   3. Need for influenza vaccination  - Flu vaccine HIGH DOSE PF  Return in about 6 months (around 05/20/2016).       Lelon Huh, MD  [facility]

## 2015-11-29 ENCOUNTER — Ambulatory Visit (INDEPENDENT_AMBULATORY_CARE_PROVIDER_SITE_OTHER): Payer: Medicare Other | Admitting: Family Medicine

## 2015-11-29 ENCOUNTER — Encounter: Payer: Self-pay | Admitting: Family Medicine

## 2015-11-29 VITALS — BP 128/58 | HR 96 | Temp 99.2°F | Resp 16 | Wt 154.0 lb

## 2015-11-29 DIAGNOSIS — R05 Cough: Secondary | ICD-10-CM | POA: Diagnosis not present

## 2015-11-29 DIAGNOSIS — R059 Cough, unspecified: Secondary | ICD-10-CM

## 2015-11-29 DIAGNOSIS — J0100 Acute maxillary sinusitis, unspecified: Secondary | ICD-10-CM | POA: Diagnosis not present

## 2015-11-29 MED ORDER — AZITHROMYCIN 250 MG PO TABS: ORAL_TABLET | ORAL | 0 refills | Status: DC

## 2015-11-29 NOTE — Progress Notes (Signed)
Patient: Monica Singh    DOB: [birth year]   82 y.o.   MRN: [id number] Visit Date: 11/29/2015  Today's Provider: Lelon Huh, MD   Chief Complaint  Patient presents with  . Cough    started this morning.    Subjective:    HPI Patient comes in today c/o cough, congestion, low grade fever, and sore throat. She reports that her symptoms started around 2:30am. She reports that she took an allergy pill around 7am and did not wake up until 9:45am. Patient reports that she does have history of seasonal allergies, but it is usually controlled with her daily allergy medications.     Allergies  Allergen Reactions  . Milk-Related Compounds Diarrhea and Nausea And Vomiting  . Sulfa Antibiotics Hives and Itching     Current Outpatient Prescriptions:  .  albuterol (PROVENTIL HFA;VENTOLIN HFA) 108 (90 Base) MCG/ACT inhaler, Inhale 2 puffs into the lungs every 6 (six) hours as needed for wheezing or shortness of breath., Disp: 1 Inhaler, Rfl: 2 .  docusate sodium (COLACE) 100 MG capsule, Take 100 mg by mouth 1 day or 1 dose. Reported on 03/27/2015, Disp: , Rfl:  .  fexofenadine (ALLEGRA) 180 MG tablet, Take 1 tablet (180 mg total) by mouth daily., Disp: 30 tablet, Rfl: 2 .  fluticasone (FLONASE) 50 MCG/ACT nasal spray, Place 2 sprays into both nostrils daily., Disp: 16 g, Rfl: 6 .  risedronate (ACTONEL) 35 MG tablet, Take 1 tablet (35 mg total) by mouth every 7 (seven) days. with water on empty stomach, nothing by mouth or lie down for next 30 minutes., Disp: 4 tablet, Rfl: 12  Review of Systems  Constitutional: Positive for chills, fatigue and fever. Negative for appetite change, diaphoresis and unexpected weight change.  HENT: Positive for congestion, postnasal drip, rhinorrhea, sore throat and trouble swallowing.   Eyes: Negative.   Respiratory: Positive for cough.     Social History  Substance Use Topics  . Smoking status: Former Smoker    Packs/day: 0.25    Types:  Cigarettes    Quit date: 06/21/1989  . Smokeless tobacco: Former Systems developer    Quit date: 01/28/1989  . Alcohol use 0.0 oz/week     Comment: occassional wine   Objective:   BP (!) 128/58 (BP Location: Left Arm, Patient Position: Sitting, Cuff Size: Normal)   Pulse 96   Temp 99.2 F (37.3 C)   Resp 16   Wt 154 lb (69.9 kg)   SpO2 95%   BMI 28.17 kg/m   Physical Exam  General Appearance:    Alert, cooperative, no distress  HENT:   bilateral TM normal without fluid or infection, neck without nodes, tonsils red, enlarged, with exudate present, maxilarry sinus tender and nasal mucosa congested  Eyes:    PERRL, conjunctiva/corneas clear, EOM's intact       Lungs:     Clear to auscultation bilaterally, respirations unlabored  Heart:    Regular rate and rhythm  Neurologic:   Awake, alert, oriented x 3. No apparent focal neurological           defect.           Assessment & Plan:     1. Acute maxillary sinusitis, recurrence not specified  - azithromycin (ZITHROMAX) 250 MG tablet; 2 by mouth today, then 1 daily for 4 days  Dispense: 6 tablet; Refill: 0  2. Cough Patient Instructions   You can take OTC guaifenesin (Mucinex)- DM to  help with cough         Lelon Huh, MD  [facility]

## 2015-11-29 NOTE — Patient Instructions (Signed)
   You can take OTC guaifenesin (Mucinex)- DM to help with cough

## 2015-12-04 ENCOUNTER — Telehealth: Payer: Self-pay | Admitting: Family Medicine

## 2015-12-04 MED ORDER — DOXYCYCLINE HYCLATE 100 MG PO CAPS: 100.0000 mg | ORAL_CAPSULE | Freq: Two times a day (BID) | ORAL | 0 refills | Status: DC

## 2015-12-04 NOTE — Telephone Encounter (Signed)
Please advise 

## 2015-12-04 NOTE — Telephone Encounter (Signed)
Rx sent to pharmacy. Patient was notified.  

## 2015-12-04 NOTE — Telephone Encounter (Signed)
Please change to doxycycline 100mg  twice a day for 7 days.

## 2015-12-04 NOTE — Telephone Encounter (Signed)
Pt states she was seen last week for cough and congestion.  Pt states she has completed the Rx given and is still have a lot thick green congestion.  Pt is asking if she will need another Rx.  [place].  CB#[phone]/MW

## 2016-01-16 DIAGNOSIS — H359 Unspecified retinal disorder: Secondary | ICD-10-CM | POA: Diagnosis not present

## 2016-03-08 ENCOUNTER — Telehealth: Payer: Self-pay

## 2016-03-08 DIAGNOSIS — Z1231 Encounter for screening mammogram for malignant neoplasm of breast: Secondary | ICD-10-CM

## 2016-03-08 NOTE — Telephone Encounter (Signed)
ok 

## 2016-03-08 NOTE — Telephone Encounter (Signed)
Pt has not had a mammogram since 2015. Is requesting a screening mammo order. Denies lumps, pain, or any breast problems. Please advise. May leave detailed message if pt does not answer. Thanks. Renaldo Fiddler, CMA

## 2016-03-08 NOTE — Telephone Encounter (Signed)
Pt advised this has been ordered. Renaldo Fiddler, CMA

## 2016-04-19 ENCOUNTER — Ambulatory Visit
Admission: RE | Admit: 2016-04-19 | Discharge: 2016-04-19 | Disposition: A | Discharge status: Home / Self Care | Payer: Medicare Other | Source: Ambulatory Visit | Attending: Family Medicine | Admitting: Family Medicine

## 2016-04-19 DIAGNOSIS — Z1231 Encounter for screening mammogram for malignant neoplasm of breast: Secondary | ICD-10-CM | POA: Diagnosis not present

## 2016-06-07 ENCOUNTER — Encounter: Payer: Self-pay | Admitting: Family Medicine

## 2016-06-07 ENCOUNTER — Ambulatory Visit (INDEPENDENT_AMBULATORY_CARE_PROVIDER_SITE_OTHER): Payer: Medicare Other | Admitting: Family Medicine

## 2016-06-07 ENCOUNTER — Ambulatory Visit (INDEPENDENT_AMBULATORY_CARE_PROVIDER_SITE_OTHER): Payer: Medicare Other

## 2016-06-07 VITALS — BP 142/76 | HR 80 | Temp 97.9°F | Ht 62.0 in | Wt 155.2 lb

## 2016-06-07 DIAGNOSIS — Z0000 Encounter for general adult medical examination without abnormal findings: Secondary | ICD-10-CM

## 2016-06-07 DIAGNOSIS — E871 Hypo-osmolality and hyponatremia: Secondary | ICD-10-CM | POA: Diagnosis not present

## 2016-06-07 DIAGNOSIS — R0609 Other forms of dyspnea: Secondary | ICD-10-CM | POA: Diagnosis not present

## 2016-06-07 DIAGNOSIS — Z87891 Personal history of nicotine dependence: Secondary | ICD-10-CM

## 2016-06-07 DIAGNOSIS — M810 Age-related osteoporosis without current pathological fracture: Secondary | ICD-10-CM | POA: Diagnosis not present

## 2016-06-07 DIAGNOSIS — E039 Hypothyroidism, unspecified: Secondary | ICD-10-CM | POA: Diagnosis not present

## 2016-06-07 DIAGNOSIS — R0600 Dyspnea, unspecified: Secondary | ICD-10-CM

## 2016-06-07 DIAGNOSIS — E038 Other specified hypothyroidism: Secondary | ICD-10-CM

## 2016-06-07 NOTE — Progress Notes (Signed)
Patient: Monica Singh, Female    DOB: 1928/07/19, 83 y.o.   MRN: [id number] Visit Date: 06/07/2016  Today's Provider: Lelon Huh, MD   No chief complaint on file.  Subjective:    Patient had AWV with [facility] earlier this morning and is here to follow up on her chronic problems.   COPD, Follow up: Patient was last seen 6 months ago. No changes were made in her medications. Patient reports good symptom control.  She states that over the last 6 months she is more short of breath with minimal exertion than in the past.   Osteoporosis, follow up: Patient was last seen 6 months ago. Patient was advised to start Risedronate 35mg  along with calicum. Patient reports that she is tolerating medication well, but she stopped it several months ago because she couldn't remember to take it.   Follow up hypothyroid. Lab Results  Component Value Date   TSH 1.81 01/10/2013      Review of Systems  Constitutional: Negative.   HENT: Negative.   Eyes: Negative.   Respiratory: Positive for shortness of breath. Negative for choking, chest tightness and wheezing.   Cardiovascular: Negative for chest pain, palpitations and leg swelling.  Gastrointestinal: Negative.   Endocrine: Negative.   Genitourinary: Negative.   Musculoskeletal: Negative.   Skin: Negative.   Allergic/Immunologic: Negative.   Hematological: Negative.   Psychiatric/Behavioral: Negative.     Social History   Social History  . Marital status: Widowed    Spouse name: N/A  . Number of children: N/A  . Years of education: N/A   Occupational History  . Not on file.   Social History Main Topics  . Smoking status: Former Smoker    Packs/day: 0.25    Types: Cigarettes    Quit date: 06/21/1989  . Smokeless tobacco: Former Systems developer    Quit date: 01/28/1989  . Alcohol use 8.4 oz/week    14 Glasses of wine per week  . Drug use: No  . Sexual activity: No   Other Topics Concern  . Not on file   Social History  Narrative  . No narrative on file    Past Medical History:  Diagnosis Date  . Adnexal mass 06/03/2014   since 2009  . 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 GERD (gastroesophageal reflux disease)   . HTN (hypertension)   . Recurrent UTI      Patient Active Problem List   Diagnosis Date Noted  . Osteoporosis 11/20/2015  . COPD (chronic obstructive pulmonary disease) ([place]) 11/20/2015  . Cyst of ovary 07/26/2015  . H/O total knee replacement 04/03/2015  . Abnormal chest sounds 08/25/2014  . Allergic rhinitis 08/25/2014  . Colon polyp 08/25/2014  . DDD (degenerative disc disease), cervical 08/25/2014  . DDD (degenerative disc disease), lumbar 08/25/2014  . H/O non-Hodgkin's lymphoma 08/25/2014  . Below normal amount of sodium in the blood 08/25/2014  . Arthritis of knee, degenerative 08/25/2014  . Subclinical hypothyroidism 08/25/2014  . Adnexal mass 06/03/2014  . 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 History of smoking 03/11/2008    Past Surgical History:  Procedure Laterality Date  . ABDOMINAL SURGERY  2009   abdominal mass+ NH lymphoma,  . CATARACT EXTRACTION  1997   right eye  . cervical neck fusion  1995  . DENTAL SURGERY     screws  . laparotomy with biopsy  03/02/2007  . PORTACATH PLACEMENT  2009  . [place]  . SQUAMOUS CELL CARCINOMA EXCISION     right arm  . TOTAL KNEE ARTHROPLASTY Right   . VAGINAL HYSTERECTOMY  1971    Her family history includes Breast cancer in her sister; CAD in her brother; Cataracts in her sister; Dementia in her sister; Heart attack in her brother and brother; Leukemia in her grandchild.      Current Outpatient Prescriptions:  .  albuterol (PROVENTIL HFA;VENTOLIN HFA) 108 (90 Base) MCG/ACT inhaler, Inhale 2 puffs into the lungs every 6 (six) hours as needed for wheezing or shortness of breath., Disp: 1 Inhaler, Rfl: 2 .  docusate sodium  (COLACE) 100 MG capsule, Take 100 mg by mouth 1 day or 1 dose. Reported on 03/27/2015, Disp: , Rfl:  .  doxycycline (VIBRAMYCIN) 100 MG capsule, Take 1 capsule (100 mg total) by mouth 2 (two) times daily. (Patient not taking: Reported on 06/07/2016), Disp: 14 capsule, Rfl: 0 .  fexofenadine (ALLEGRA) 180 MG tablet, Take 1 tablet (180 mg total) by mouth daily. (Patient taking differently: Take 180 mg by mouth daily. ), Disp: 30 tablet, Rfl: 2 .  fluticasone (FLONASE) 50 MCG/ACT nasal spray, Place 2 sprays into both nostrils daily. (Patient taking differently: Place 2 sprays into both nostrils daily. ), Disp: 16 g, Rfl: 6 .  risedronate (ACTONEL) 35 MG tablet, Take 1 tablet (35 mg total) by mouth every 7 (seven) days. with water on empty stomach, nothing by mouth or lie down for next 30 minutes. (Patient not taking: Reported on 06/07/2016), Disp: 4 tablet, Rfl: 12  Patient Care Team: Birdie Sons, MD as PCP - General (Family Medicine) Dingeldein, Remo Lipps, MD as Consulting Physician (Ophthalmology) Cammie Sickle, MD as Consulting Physician (Internal Medicine)     Objective:   Vitals: BP    142/76 (BP Location: Right Arm)     Pulse  80     Temp  97.9 F (36.6 C) (Oral)     Ht  5\' 2"  (1.575 m)     Wt  155 lb 3.2 oz (70.4 kg)      BMI  28.39 kg/m        Physical Exam   General Appearance:    Alert, cooperative, no distress  Eyes:    PERRL, conjunctiva/corneas clear, EOM's intact       Lungs:     Clear to auscultation bilaterally, respirations unlabored  Heart:    Regular rate and rhythm  Neurologic:   Awake, alert, oriented x 3. No apparent focal neurological           defect.        Activities of Daily Living In your present state of health, do you have any difficulty performing the following activities: 06/07/2016  Hearing? Y  Vision? Y  Difficulty concentrating or making decisions? N  Walking or climbing stairs? N  Dressing or bathing? N  Doing errands,  shopping? N  Preparing Food and eating ? N  Using the Toilet? N  In the past six months, have you accidently leaked urine? Y  Do you have problems with loss of bowel control? N  Managing your Medications? N  Managing your Finances? N  Housekeeping or managing your Housekeeping? N  Some recent data might be hidden    Fall Risk Assessment Fall Risk  06/07/2016 11/20/2015 10/12/2014  Falls in the past year? No No No     Depression Screen PHQ 2/9  Scores 06/07/2016 06/07/2016 10/12/2014  PHQ - 2 Score 0 0 0  PHQ- 9 Score 0 - -    Cognitive Testing - 6-CIT 6CIT Screen 06/07/2016  What Year? 0 points  What month? 0 points  What time? 0 points  Count back from 20 0 points  Months in reverse 0 points  Repeat phrase 2 points  Total Score 2         Assessment & Plan:     1. Osteoporosis, unspecified osteoporosis type, unspecified pathological fracture presence Extensive discussion regarding risks of untreated osteoporosis. She is willing to start back on actonel which she was tolerating. She stats she has prescription and still has refills.  - VITAMIN D 25 Hydroxy (Vit-D Deficiency, Fractures)  2. Below normal amount of sodium in the blood  - Comprehensive metabolic panel  3. History of smoking   4. Subclinical hypothyroidism  - T4 AND TSH  5. Dyspnea on exertion  - CBC - Brain natriuretic peptide - Spirometry with Graph    Lelon Huh, MD  [facility]

## 2016-06-07 NOTE — Progress Notes (Signed)
.   Subjective:   Monica Singh is a 83 y.o. female who presents for Medicare Annual (Subsequent) preventive examination.  Review of Systems:  N/A  Cardiac Risk Factors include: advanced age (>17men, >67 women);sedentary lifestyle     Objective:     Vitals: BP (!) 142/76 (BP Location: Right Arm)   Pulse 80   Temp 97.9 F (36.6 C) (Oral)   Ht 5\' 2"  (1.575 m)   Wt 155 lb 3.2 oz (70.4 kg)   BMI 28.39 kg/m   Body mass index is 28.39 kg/m.   Tobacco History  Smoking Status  . Former Smoker  . Packs/day: 0.25  . Types: Cigarettes  . Quit date: 06/21/1989  Smokeless Tobacco  . Former Systems developer  . Quit date: 01/28/1989     Counseling given: Not Answered   Past Medical History:  Diagnosis Date  . Adnexal mass 06/03/2014   since 2009  . 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 GERD (gastroesophageal reflux disease)   . HTN (hypertension)   . Recurrent UTI    Past Surgical History:  Procedure Laterality Date  . ABDOMINAL SURGERY  2009   abdominal mass+ NH lymphoma,  . CATARACT EXTRACTION  1997   right eye  . cervical neck fusion  1995  . DENTAL SURGERY     screws  . laparotomy with biopsy  03/02/2007  . PORTACATH PLACEMENT  2009  . [place]  . SQUAMOUS CELL CARCINOMA EXCISION     right arm  . TOTAL KNEE ARTHROPLASTY Right   . VAGINAL HYSTERECTOMY  1971   Family History  Problem Relation Age of Onset  . Breast cancer Sister   . Dementia Sister   . Cataracts Sister   . Heart attack Brother   . CAD Brother   . Heart attack Brother   . Leukemia Grandchild        granddaughter  . Kidney disease Neg Hx   . Bladder Cancer Neg Hx    History  Sexual Activity  . Sexual activity: No    Outpatient Encounter Prescriptions as of 06/07/2016  Medication Sig  . albuterol (PROVENTIL HFA;VENTOLIN HFA) 108 (90 Base) MCG/ACT inhaler Inhale 2 puffs into the lungs every 6 (six) hours as  needed for wheezing or shortness of breath.  . docusate sodium (COLACE) 100 MG capsule Take 100 mg by mouth 1 day or 1 dose. Reported on 03/27/2015  . fexofenadine (ALLEGRA) 180 MG tablet Take 1 tablet (180 mg total) by mouth daily. (Patient taking differently: Take 180 mg by mouth daily. )  . fluticasone (FLONASE) 50 MCG/ACT nasal spray Place 2 sprays into both nostrils daily. (Patient taking differently: Place 2 sprays into both nostrils daily. )  . doxycycline (VIBRAMYCIN) 100 MG capsule Take 1 capsule (100 mg total) by mouth 2 (two) times daily. (Patient not taking: Reported on 06/07/2016)  . risedronate (ACTONEL) 35 MG tablet Take 1 tablet (35 mg total) by mouth every 7 (seven) days. with water on empty stomach, nothing by mouth or lie down for next 30 minutes. (Patient not taking: Reported on 06/07/2016)   No facility-administered encounter medications on file as of 06/07/2016.     Activities of Daily Living In your present state of health, do you have any difficulty performing the following activities: 06/07/2016  Hearing? Y  Vision? Y  Difficulty concentrating or making decisions? N  Walking or climbing  stairs? N  Dressing or bathing? N  Doing errands, shopping? N  Preparing Food and eating ? N  Using the Toilet? N  In the past six months, have you accidently leaked urine? Y  Do you have problems with loss of bowel control? N  Managing your Medications? N  Managing your Finances? N  Housekeeping or managing your Housekeeping? N  Some recent data might be hidden    Patient Care Team: Birdie Sons, MD as PCP - General (Family Medicine) Dingeldein, Remo Lipps, MD as Consulting Physician (Ophthalmology) Cammie Sickle, MD as Consulting Physician (Internal Medicine)    Assessment:     Exercise Activities and Dietary recommendations Current Exercise Habits: The patient does not participate in regular exercise at present, Exercise limited by: None identified  Goals    .  Increase water intake          Recommend increasing water intake to 4 glasses daily.       Fall Risk Fall Risk  06/07/2016 11/20/2015 10/12/2014  Falls in the past year? No No No   Depression Screen PHQ 2/9 Scores 06/07/2016 06/07/2016 10/12/2014  PHQ - 2 Score 0 0 0  PHQ- 9 Score 0 - -     Cognitive Function     6CIT Screen 06/07/2016  What Year? 0 points  What month? 0 points  What time? 0 points  Count back from 20 0 points  Months in reverse 0 points  Repeat phrase 2 points  Total Score 2    Immunization History  Administered Date(s) Administered  . Influenza, High Dose Seasonal PF 10/12/2014, 11/20/2015  . Influenza-Unspecified 09/28/2013  . Pneumococcal Conjugate-13 12/21/2012  . Pneumococcal Polysaccharide-23 12/02/1997  . Tdap 10/17/2010  . Zoster 10/14/2008   Screening Tests Health Maintenance  Topic Date Due  . INFLUENZA VACCINE  08/28/2016  . TETANUS/TDAP  10/16/2020  . DEXA SCAN  Completed  . PNA vac Low Risk Adult  Completed      Plan:  I have personally reviewed and addressed the Medicare Annual Wellness questionnaire and have noted the following in the patient's chart:  A. Medical and social history B. Use of alcohol, tobacco or illicit drugs  C. Current medications and supplements D. Functional ability and status E.  Nutritional status F.  Physical activity G. Advance directives H. List of other physicians I.  Hospitalizations, surgeries, and ER visits in previous 12 months J.  [place] such as hearing and vision if needed, cognitive and depression L. Referrals and appointments - none  In addition, I have reviewed and discussed with patient certain preventive protocols, quality metrics, and best practice recommendations. A written personalized care plan for preventive services as well as general preventive health recommendations were provided to patient.  See attached scanned questionnaire for additional information.   Signed,    Fabio Neighbors, LPN Nurse Health Advisor   MD Recommendations: None.  I have reviewed the health advisor's note, was available for consultation, and agree with documentation and plan  Lelon Huh, MD

## 2016-06-07 NOTE — Patient Instructions (Addendum)
Start back on Actonel (risedronate) once every week to help prevent hip fractures

## 2016-06-07 NOTE — Patient Instructions (Signed)
Monica Singh , Thank you for taking time to come for your Medicare Wellness Visit. I appreciate your ongoing commitment to your health goals. Please review the following plan we discussed and let me know if I can assist you in the future.   Screening recommendations/referrals: Colonoscopy: completed 11/20/10 Mammogram: completed 05/03/16 Bone Density: completed 02/28/2014 Recommended yearly ophthalmology/optometry visit for glaucoma screening and checkup Recommended yearly dental visit for hygiene and checkup  Vaccinations: Influenza vaccine: up to date, due 09/2016 Pneumococcal vaccine: completed series Tdap vaccine: completed 10/17/10 Shingles vaccine: completed 10/14/08    Advanced directives: Please bring a copy of your POA (Power of [place]) and/or Living Will to your next appointment.   Conditions/risks identified: Recommend increasing water intake to 4 glasses a day.  Next appointment: None, need to schedule 1 year AWV.   Preventive Care 43 Years and Older, Female Preventive care refers to lifestyle choices and visits with your health care provider that can promote health and wellness. What does preventive care include?  A yearly physical exam. This is also called an annual well check.  Dental exams once or twice a year.  Routine eye exams. Ask your health care provider how often you should have your eyes checked.  Personal lifestyle choices, including:  Daily care of your teeth and gums.  Regular physical activity.  Eating a healthy diet.  Avoiding tobacco and drug use.  Limiting alcohol use.  Practicing safe sex.  Taking low-dose aspirin every day.  Taking vitamin and mineral supplements as recommended by your health care provider. What happens during an annual well check? The services and screenings done by your health care provider during your annual well check will depend on your age, overall health, lifestyle risk factors, and family history of  disease. Counseling  Your health care provider may ask you questions about your:  Alcohol use.  Tobacco use.  Drug use.  Emotional well-being.  Home and relationship well-being.  Sexual activity.  Eating habits.  History of falls.  Memory and ability to understand (cognition).  Work and work Statistician.  Reproductive health. Screening  You may have the following tests or measurements:  Height, weight, and BMI.  Blood pressure.  Lipid and cholesterol levels. These may be checked every 5 years, or more frequently if you are over 98 years old.  Skin check.  Lung cancer screening. You may have this screening every year starting at age 62 if you have a 30-pack-year history of smoking and currently smoke or have quit within the past 15 years.  Fecal occult blood test (FOBT) of the stool. You may have this test every year starting at age 50.  Flexible sigmoidoscopy or colonoscopy. You may have a sigmoidoscopy every 5 years or a colonoscopy every 10 years starting at age 67.  Hepatitis C blood test.  Hepatitis B blood test.  Sexually transmitted disease (STD) testing.  Diabetes screening. This is done by checking your blood sugar (glucose) after you have not eaten for a while (fasting). You may have this done every 1-3 years.  Bone density scan. This is done to screen for osteoporosis. You may have this done starting at age 82.  Mammogram. This may be done every 1-2 years. Talk to your health care provider about how often you should have regular mammograms. Talk with your health care provider about your test results, treatment options, and if necessary, the need for more tests. Vaccines  Your health care provider may recommend certain vaccines, such as:  Influenza  vaccine. This is recommended every year.  Tetanus, diphtheria, and acellular pertussis (Tdap, Td) vaccine. You may need a Td booster every 10 years.  Zoster vaccine. You may need this after age  23.  Pneumococcal 13-valent conjugate (PCV13) vaccine. One dose is recommended after age 1.  Pneumococcal polysaccharide (PPSV23) vaccine. One dose is recommended after age 74. Talk to your health care provider about which screenings and vaccines you need and how often you need them. This information is not intended to replace advice given to you by your health care provider. Make sure you discuss any questions you have with your health care provider. Document Released: 02/10/2015 Document Revised: 10/04/2015 Document Reviewed: 11/15/2014 Elsevier Interactive Patient Education  2017 [place] Prevention in the Home Falls can cause injuries. They can happen to people of all ages. There are many things you can do to make your home safe and to help prevent falls. What can I do on the outside of my home?  Regularly fix the edges of walkways and driveways and fix any cracks.  Remove anything that might make you trip as you walk through a door, such as a raised step or threshold.  Trim any bushes or trees on the path to your home.  Use bright outdoor lighting.  Clear any walking paths of anything that might make someone trip, such as rocks or tools.  Regularly check to see if handrails are loose or broken. Make sure that both sides of any steps have handrails.  Any raised decks and porches should have guardrails on the edges.  Have any leaves, snow, or ice cleared regularly.  Use sand or salt on walking paths during winter.  Clean up any spills in your garage right away. This includes oil or grease spills. What can I do in the bathroom?  Use night lights.  Install grab bars by the toilet and in the tub and shower. Do not use towel bars as grab bars.  Use non-skid mats or decals in the tub or shower.  If you need to sit down in the shower, use a plastic, non-slip stool.  Keep the floor dry. Clean up any water that spills on the floor as soon as it happens.  Remove  soap buildup in the tub or shower regularly.  Attach bath mats securely with double-sided non-slip rug tape.  Do not have throw rugs and other things on the floor that can make you trip. What can I do in the bedroom?  Use night lights.  Make sure that you have a light by your bed that is easy to reach.  Do not use any sheets or blankets that are too big for your bed. They should not hang down onto the floor.  Have a firm chair that has side arms. You can use this for support while you get dressed.  Do not have throw rugs and other things on the floor that can make you trip. What can I do in the kitchen?  Clean up any spills right away.  Avoid walking on wet floors.  Keep items that you use a lot in easy-to-reach places.  If you need to reach something above you, use a strong step stool that has a grab bar.  Keep electrical cords out of the way.  Do not use floor polish or wax that makes floors slippery. If you must use wax, use non-skid floor wax.  Do not have throw rugs and other things on the floor that can  make you trip. What can I do with my stairs?  Do not leave any items on the stairs.  Make sure that there are handrails on both sides of the stairs and use them. Fix handrails that are broken or loose. Make sure that handrails are as long as the stairways.  Check any carpeting to make sure that it is firmly attached to the stairs. Fix any carpet that is loose or worn.  Avoid having throw rugs at the top or bottom of the stairs. If you do have throw rugs, attach them to the floor with carpet tape.  Make sure that you have a light switch at the top of the stairs and the bottom of the stairs. If you do not have them, ask someone to add them for you. What else can I do to help prevent falls?  Wear shoes that:  Do not have high heels.  Have rubber bottoms.  Are comfortable and fit you well.  Are closed at the toe. Do not wear sandals.  If you use a  stepladder:  Make sure that it is fully opened. Do not climb a closed stepladder.  Make sure that both sides of the stepladder are locked into place.  Ask someone to hold it for you, if possible.  Clearly mark and make sure that you can see:  Any grab bars or handrails.  First and last steps.  Where the edge of each step is.  Use tools that help you move around (mobility aids) if they are needed. These include:  Canes.  Walkers.  Scooters.  Crutches.  Turn on the lights when you go into a dark area. Replace any light bulbs as soon as they burn out.  Set up your furniture so you have a clear path. Avoid moving your furniture around.  If any of your floors are uneven, fix them.  If there are any pets around you, be aware of where they are.  Review your medicines with your doctor. Some medicines can make you feel dizzy. This can increase your chance of falling. Ask your doctor what other things that you can do to help prevent falls. This information is not intended to replace advice given to you by your health care provider. Make sure you discuss any questions you have with your health care provider. Document Released: 11/10/2008 Document Revised: 06/22/2015 Document Reviewed: 02/18/2014 Elsevier Interactive Patient Education  2017 [employer].

## 2016-06-08 LAB — COMPREHENSIVE METABOLIC PANEL
ALT: 15 IU/L (ref 0–32)
AST: 16 IU/L (ref 0–40)
Albumin/Globulin Ratio: 1.9 (ref 1.2–2.2)
Albumin: 4.6 g/dL (ref 3.5–4.7)
Alkaline Phosphatase: 65 IU/L (ref 39–117)
BUN/Creatinine Ratio: 15 (ref 12–28)
BUN: 11 mg/dL (ref 8–27)
Bilirubin Total: 0.6 mg/dL (ref 0.0–1.2)
CO2: 27 mmol/L (ref 18–29)
Calcium: 9.7 mg/dL (ref 8.7–10.3)
Chloride: 92 mmol/L — ABNORMAL LOW (ref 96–106)
Creatinine, Ser: 0.73 mg/dL (ref 0.57–1.00)
GFR calc Af Amer: 87 mL/min/{1.73_m2} (ref >59)
GFR calc non Af Amer: 75 mL/min/{1.73_m2} (ref >59)
Globulin, Total: 2.4 g/dL (ref 1.5–4.5)
Glucose: 111 mg/dL — ABNORMAL HIGH (ref 65–99)
Potassium: 5.1 mmol/L (ref 3.5–5.2)
Sodium: 135 mmol/L (ref 134–144)
Total Protein: 7 g/dL (ref 6.0–8.5)

## 2016-06-08 LAB — CBC
Hematocrit: 42.1 % (ref 34.0–46.6)
Hemoglobin: 14.2 g/dL (ref 11.1–15.9)
MCH: 31.7 pg (ref 26.6–33.0)
MCHC: 33.7 g/dL (ref 31.5–35.7)
MCV: 94 fL (ref 79–97)
Platelets: 315 10*3/uL (ref 150–379)
RBC: 4.48 x10E6/uL (ref 3.77–5.28)
RDW: 13.3 % (ref 12.3–15.4)
WBC: 6.7 10*3/uL (ref 3.4–10.8)

## 2016-06-08 LAB — T4 AND TSH
T4, Total: 5.2 ug/dL (ref 4.5–12.0)
TSH: 3.04 u[IU]/mL (ref 0.450–4.500)

## 2016-06-08 LAB — VITAMIN D 25 HYDROXY (VIT D DEFICIENCY, FRACTURES): Vit D, 25-Hydroxy: 38.8 ng/mL (ref 30.0–100.0)

## 2016-06-08 LAB — BRAIN NATRIURETIC PEPTIDE: BNP: 69.8 pg/mL (ref 0.0–100.0)

## 2016-06-20 ENCOUNTER — Inpatient Hospital Stay: Payer: Medicare Other | Admitting: Oncology

## 2016-06-20 ENCOUNTER — Inpatient Hospital Stay: Payer: Medicare Other

## 2016-07-02 ENCOUNTER — Inpatient Hospital Stay: Payer: Medicare Other | Admitting: Oncology

## 2016-07-02 ENCOUNTER — Inpatient Hospital Stay: Payer: Medicare Other

## 2016-07-17 ENCOUNTER — Ambulatory Visit
Admission: RE | Admit: 2016-07-17 | Discharge: 2016-07-17 | Disposition: A | Discharge status: Home / Self Care | Payer: Medicare Other | Source: Ambulatory Visit | Attending: Obstetrics and Gynecology | Admitting: Obstetrics and Gynecology

## 2016-07-17 ENCOUNTER — Inpatient Hospital Stay: Payer: Medicare Other

## 2016-07-17 ENCOUNTER — Telehealth: Payer: Self-pay

## 2016-07-17 DIAGNOSIS — N83209 Unspecified ovarian cyst, unspecified side: Secondary | ICD-10-CM | POA: Diagnosis present

## 2016-07-17 DIAGNOSIS — N83292 Other ovarian cyst, left side: Secondary | ICD-10-CM | POA: Insufficient documentation

## 2016-07-17 NOTE — Telephone Encounter (Signed)
  Oncology Nurse Navigator Documentation Ms. Ricke notified of U/S results. Appointment rescheduled to 08/07/16 with Dr. Theora Gianotti. Scheduling mailing new appointment to her.  IMPRESSION: Stable 4.4 cm simple appearing left adnexal cyst.    Navigator Location: [facility] (07/17/16 1300)   )Navigator Encounter Type: Diagnostic Results (07/17/16 1300)                                                    Time Spent with Patient: 15 (07/17/16 1300)

## 2016-07-17 NOTE — Progress Notes (Deleted)
Gynecologic Oncology Interval Note  Referring MD: Dr. Forest Gleason   PCP PI:RJJOA Venia Minks, [address] [place], [place] [address] [phone]  Chief Complaint: Ovarian cyst surveillance.   Subjective:  [place] Monica Singh is a 83 y.o. woman.  Patient presents today for continued surveillance for history of ovarian cyst. She is doing well today.   Her only complaints is SOB and she is seeing Dr. Venia Minks for this issue.  Her ultrasound 06/13/2014 compared to 11/26/2013 and 10/08/2012  was reasssuring as noted below.    08/04/2015 Pelvic ultrasound FINDINGS: Right ovary could not be visualized. Left ovary No solid left ovarian tissue was observed. A simple appearing cystic structure in the left adnexal region measuring 3.9 x 4.3 x 3.9 cm was demonstrated. This is slightly larger than on the previous study.  IMPRESSION: Nonvisualization of the ovaries. Persistent cystic structure in the left adnexal region which is slightly larger than on the previous study. Its maximal dimension today is 4.3 cm. By report this cystic structure had decreased to a maximum of 3.6 cm on the Jun 13, 2014 study. A reference was made to the cystic structure being larger on the study of earlier than 2016. None of the studies are available to me in [facility] for review.  .   Lab Results  Component Value Date   CA125 11.7 06/22/2014    Oncology Treatment History:  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 She had been seen by Dr. Sabra Heck for several years for an adnexal mass has been present since 2009. She is status post hysterectomy. Her history is as follows:  CA-125 on 01/2011 was 12.4  03/01/2011 she had an ultrasound compared to 2011. Within the left neck for region there is a cystic mass measuring 2.1 x 3.98 x 3.2 cm. What appeared to be the ovary included a cyst measuring 4.3 x 4.32 x 3.32 cm.   CA-125 on 03/2011 was  13.0  Repeat ultrasound 09/03/2011 the left ovary measured 4.2 x 3.1 x 3.4 cm. The cystic mass measured 3.2 x 2.5 x 2.9 cm. The cystic mass was relatively anechoic without a dominant area of nodularity. There may be mild thickening of the wall but similar to prior.   CA-125 on 09/2011 was 11.3.   Ultrasound on 10/08/2012 on the left ovary demonstrated and measured 4.5 x 4.1 x 3.5 cm and contained a 4 x 3.2 x 3.7 cm cystic area there is persistent solid and cystic adnexal process presumed to reflect ovary and associated ovarian cyst.  Her ultrasound 06/13/2014 compared to 11/26/2013 and 10/08/2012  was reasssuring as noted below. Her CA125 was 11.7.  Measurements: 4.5 x 3.9 x 3.6 cm. There is an anechoic structure measuring 3.6 x 3.6 x 3.4 cm which is slightly smaller than on the previous study. Previous dimensions of this anechoic structure were 4.4 x 3.4 x 3.4 cm  IMPRESSION: 1. Slight interval decrease in the size of the hypoechoic slightly irregularly marginated left ovarian cystic structure. 2. The right ovary could not be demonstrated. The uterus is surgically absent.   Problem List: Patient Active Problem List   Diagnosis Date Noted  . Osteoporosis 11/20/2015  . COPD (chronic obstructive pulmonary disease) ([place]) 11/20/2015  . Cyst of ovary 07/26/2015  . Monica/O total knee replacement 04/03/2015  . Abnormal chest sounds 08/25/2014  . Allergic rhinitis 08/25/2014  . Colon polyp 08/25/2014  . DDD (degenerative  disc disease), cervical 08/25/2014  . DDD (degenerative disc disease), lumbar 08/25/2014  . Monica/O non-Hodgkin's lymphoma 08/25/2014  . Below normal amount of sodium in the blood 08/25/2014  . Arthritis of knee, degenerative 08/25/2014  . Subclinical hypothyroidism 08/25/2014  . Adnexal mass 06/03/2014  . 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 History of smoking 03/11/2008    Past Medical History: Past Medical History:  Diagnosis Date  . Adnexal mass 06/03/2014    since 2009  . 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 GERD (gastroesophageal reflux disease)   . Herpes zoster without complication 08/05/2954  . HTN (hypertension)   . Recurrent UTI     Past Surgical History: Past Surgical History:  Procedure Laterality Date  . ABDOMINAL SURGERY  2009   abdominal mass+ [place] lymphoma,  . CATARACT EXTRACTION  1997   right eye  . cervical neck fusion  1995  . DENTAL SURGERY     screws  . laparotomy with biopsy  03/02/2007  . PORTACATH PLACEMENT  2009  . [place]  . SQUAMOUS CELL CARCINOMA EXCISION     right arm  . TOTAL KNEE ARTHROPLASTY Right   . VAGINAL HYSTERECTOMY  1971    Family History: Family History  Problem Relation Age of Onset  . Breast cancer Sister   . Dementia Sister   . Cataracts Sister   . Heart attack Brother   . CAD Brother   . Heart attack Brother   . Leukemia Grandchild        granddaughter  . Kidney disease Neg Hx   . Bladder Cancer Neg Hx     Social History: Social History   Social History  . Marital status: Widowed    Spouse name: N/A  . Number of children: N/A  . Years of education: N/A   Occupational History  . Not on file.   Social History Main Topics  . Smoking status: Former Smoker    Packs/day: 0.25    Types: Cigarettes    Quit date: 06/21/1989  . Smokeless tobacco: Former Systems developer    Quit date: 01/28/1989  . Alcohol use 8.4 oz/week    14 Glasses of wine per week  . Drug use: No  . Sexual activity: No   Other Topics Concern  . Not on file   Social History Narrative  . No narrative on file    Allergies: Allergies  Allergen Reactions  . Milk-Related Compounds Diarrhea and Nausea And Vomiting  . Sulfa Antibiotics Hives and Itching    Current Medications: Current Outpatient Prescriptions  Medication Sig Dispense Refill  . albuterol (PROVENTIL HFA;VENTOLIN HFA) 108 (90 Base) MCG/ACT inhaler Inhale 2 puffs  into the lungs every 6 (six) hours as needed for wheezing or shortness of breath. 1 Inhaler 2  . docusate sodium (COLACE) 100 MG capsule Take 100 mg by mouth 1 day or 1 dose. Reported on 03/27/2015    . fexofenadine (ALLEGRA) 180 MG tablet Take 1 tablet (180 mg total) by mouth daily. (Patient taking differently: Take 180 mg by mouth daily. ) 30 tablet 2  . fluticasone (FLONASE) 50 MCG/ACT nasal spray Place 2 sprays into both nostrils daily. (Patient taking differently: Place 2 sprays into both nostrils daily. ) 16 g 6  . risedronate (ACTONEL) 35 MG tablet Take 1 tablet (35 mg total) by mouth every 7 (seven) days. with water on empty stomach, nothing  by mouth or lie down for next 30 minutes. (Patient not taking: Reported on 06/07/2016) 4 tablet 12   No current facility-administered medications for this visit.       Review of Systems A comprehensive review of systems was negative.  General: no complaints  HEENT: no complaints  Lungs: shortness of breath  Cardiac: no complaints  GI: no complaints  GU: no complaints  Musculoskeletal: no complaints  Extremities: no complaints  Skin: no complaints  Neuro: no complaints  Endocrine: no complaints  Psych: no complaints      Objective:  There were no vitals taken for this visit.   ECOG Performance Status: 0 - Asymptomatic  General appearance: alert, cooperative and appears stated age 42: ATNC Abdomen:no palpable masses, no hernias, soft, nontender, nondistended, no evidence of ascites.  Extremities: no lower extremity edema Neurological exam reveals alert, oriented, normal speech, no focal findings or movement disorder noted  Pelvic: Vulva: normal appearing vulva with no masses, tenderness or lesions; Vagina: normal vagina; Adnexa: normal adnexa in size, nontender and no masses; Uterus: surgically absent, vaginal cuff well healed; Cervix: absent; Rectal: not performed  Lab Review  Lab Results  Component Value Date   CA125 11.7  06/22/2014   CA125 11.1 11/24/2013   CA125 11.3 10/14/2011    Assessment:  Monica Singh is a 83 y.o. female with an ovarian cyst, asymptomatic and decreased in size.    Plan:   Problem List Items Addressed This Visit    None      We will obtain Ultrasound today. The patient would like to avoid the endovaginal probe, which I think is okay as long as the cyst can be evaluated transabdominally. She understands that if the cyst can not be fully visualized that way then the endovaginal approach will also be used. Obtain CA-125 if cyst changes in character or enlarges on imaging. Repeat pelvic ultrasound and exam in 12 months.    Suggested return to clinic in  12 months.  Gillis Ends, MD   ADDENDUM: Based on a review I found the authors said the following, "A recent expert review suggested that low-risk abnormalities can undergo an initial 23-month follow-up, with those that remain stable or decreasing in size being examined every 12 months for 5 years.[40]"  Eulah Citizen RW. Evaluation and management of ultrasonographically detected ovarian tumors in asymptomatic women. Obstet Gynecol 2016; 211:173-567.  CrossRef   PubMed   Web of Science  Plan if patient ultrasound stable she can be released from clinic. We will advise RTC if she develops concerning symptoms.  Gillis Ends, MD   CC:  Margarita Rana, MD [address] [place], [place] [address] [phone]

## 2016-07-22 ENCOUNTER — Telehealth: Payer: Self-pay | Admitting: Family Medicine

## 2016-07-22 NOTE — Telephone Encounter (Signed)
Pt calling requesting a referral to see a vein specialist. CB # [phone]. Thanks CC

## 2016-07-23 NOTE — Telephone Encounter (Signed)
Called pt back for more info. Patient stated that she has had pain in the back of her lower legs for over a month. Patient also stated that she has spider veins in the same area. Patient is wanting to see a specialist to see if this is a vein problem. Advised pt that she will most likely have to have an office visit before we refer her out. Patient is fine with that. Also advised pt Dr. Caryn Section is out of office until next week. Please advise?

## 2016-07-23 NOTE — Telephone Encounter (Signed)
Can schedule with Dr. Caryn Section since it has been happening over a month. May schedule sooner with one of [place] if patient unwilling to wait.

## 2016-07-23 NOTE — Telephone Encounter (Signed)
Patient was notified. Patient stated that she is fine with waiting to see Dr. Caryn Section. Patient is going on vacation next week for 2 weeks. She will call for ov when she gets back.

## 2016-07-24 ENCOUNTER — Ambulatory Visit: Payer: Medicare Other

## 2016-07-29 ENCOUNTER — Encounter: Payer: Self-pay | Admitting: Family Medicine

## 2016-07-29 ENCOUNTER — Ambulatory Visit (INDEPENDENT_AMBULATORY_CARE_PROVIDER_SITE_OTHER): Payer: Medicare Other | Admitting: Family Medicine

## 2016-07-29 VITALS — BP 128/62 | HR 94 | Temp 97.8°F | Resp 14 | Wt 155.0 lb

## 2016-07-29 DIAGNOSIS — H1032 Unspecified acute conjunctivitis, left eye: Secondary | ICD-10-CM | POA: Diagnosis not present

## 2016-07-29 MED ORDER — ERYTHROMYCIN 5 MG/GM OP OINT: 1.0000 "application " | TOPICAL_OINTMENT | Freq: Three times a day (TID) | OPHTHALMIC | 0 refills | Status: DC

## 2016-07-29 NOTE — Progress Notes (Signed)
Patient: Monica Singh    DOB: 08-10-1928   83 y.o.   MRN: [id number] Visit Date: 07/29/2016  Today's Provider: Vernie Murders, PA   Chief Complaint  Patient presents with  . Eye Pain   Subjective:    HPI  Patient states she has been bothered with eye redness for 1 month-beginning of June. She thought this was related to seasonal allergies but yesterday and today eyes feel worse, left eye is hurting-feels like it is hurting behind her eye. Eyes are matted up in the morning, sometimes itching is present. She does have a headache, runny nose, post nasal drip. She has been taking Allegra and using Flonase but not daily. Feels sluggish today but thinks maybe it is from headache. No visual abnormalities have been noted.   Patient Active Problem List   Diagnosis Date Noted  . Osteoporosis 11/20/2015  . COPD (chronic obstructive pulmonary disease) ([place]) 11/20/2015  . Cyst of ovary 07/26/2015  . H/O total knee replacement 04/03/2015  . Abnormal chest sounds 08/25/2014  . Allergic rhinitis 08/25/2014  . Colon polyp 08/25/2014  . DDD (degenerative disc disease), cervical 08/25/2014  . DDD (degenerative disc disease), lumbar 08/25/2014  . H/O non-Hodgkin's lymphoma 08/25/2014  . Below normal amount of sodium in the blood 08/25/2014  . Arthritis of knee, degenerative 08/25/2014  . Subclinical hypothyroidism 08/25/2014  . Adnexal mass 06/03/2014  . 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 History of smoking 03/11/2008   Past Surgical History:  Procedure Laterality Date  . ABDOMINAL SURGERY  2009   abdominal mass+ NH lymphoma,  . CATARACT EXTRACTION  1997   right eye  . cervical neck fusion  1995  . DENTAL SURGERY     screws  . laparotomy with biopsy  03/02/2007  . PORTACATH PLACEMENT  2009  . [place]  . SQUAMOUS CELL CARCINOMA EXCISION     right arm  . TOTAL KNEE ARTHROPLASTY Right   . VAGINAL HYSTERECTOMY  1971   Family History  Problem  Relation Age of Onset  . Breast cancer Sister   . Dementia Sister   . Cataracts Sister   . Heart attack Brother   . CAD Brother   . Heart attack Brother   . Leukemia Grandchild        granddaughter  . Kidney disease Neg Hx   . Bladder Cancer Neg Hx     Allergies  Allergen Reactions  . Milk-Related Compounds Diarrhea and Nausea And Vomiting  . Sulfa Antibiotics Hives and Itching    Current Outpatient Prescriptions:  .  albuterol (PROVENTIL HFA;VENTOLIN HFA) 108 (90 Base) MCG/ACT inhaler, Inhale 2 puffs into the lungs every 6 (six) hours as needed for wheezing or shortness of breath., Disp: 1 Inhaler, Rfl: 2 .  docusate sodium (COLACE) 100 MG capsule, Take 100 mg by mouth 1 day or 1 dose. Reported on 03/27/2015, Disp: , Rfl:  .  fexofenadine (ALLEGRA) 180 MG tablet, Take 1 tablet (180 mg total) by mouth daily. (Patient taking differently: Take 180 mg by mouth daily. ), Disp: 30 tablet, Rfl: 2 .  fluticasone (FLONASE) 50 MCG/ACT nasal spray, Place 2 sprays into both nostrils daily. (Patient taking differently: Place 2 sprays into both nostrils daily. ), Disp: 16 g, Rfl: 6 .  risedronate (ACTONEL) 35 MG tablet, Take 1 tablet (35 mg total) by mouth every 7 (seven) days. with water on empty stomach, nothing by mouth or lie down for  next 30 minutes., Disp: 4 tablet, Rfl: 12  Review of Systems  Constitutional: Positive for fatigue.  HENT: Positive for congestion, postnasal drip, rhinorrhea, sinus pressure and sneezing.   Eyes: Positive for pain, discharge, redness and itching.  Respiratory: Negative.   Cardiovascular: Negative.     Social History  Substance Use Topics  . Smoking status: Former Smoker    Packs/day: 0.25    Types: Cigarettes    Quit date: 06/21/1989  . Smokeless tobacco: Former Systems developer    Quit date: 01/28/1989  . Alcohol use 8.4 oz/week    14 Glasses of wine per week   Objective:   BP 128/62   Pulse 94   Temp 97.8 F (36.6 C)   Resp 14   Wt 155 lb (70.3 kg)   BMI  28.35 kg/m  Vitals:   07/29/16 1539  BP: 128/62  Pulse: 94  Resp: 14  Temp: 97.8 F (36.6 C)  Weight: 155 lb (70.3 kg)   Physical Exam  Constitutional: She appears well-developed and well-nourished.  HENT:  Head: Normocephalic.  Right Ear: External ear normal.  Left Ear: External ear normal.  Nose: Nose normal.  Mouth/Throat: Oropharynx is clear and moist.  Wearing hearing aids bilaterally.  Eyes: EOM are normal. Pupils are equal, round, and reactive to light.  Injected conjunctiva OS. No drainage at the present.       Assessment & Plan:     1. Acute conjunctivitis of left eye, unspecified acute conjunctivitis type Has had some redness over the past month. No discomfort until it recurred the past couple days. May use warm compresses and add Erythromycin ointment TID. Recheck with ophthalmologist if no better in 3-4 days. - erythromycin ophthalmic ointment; Place 1 application into the left eye 3 (three) times daily. Use 1/2" ribbon of ointment for each application.  Dispense: 3.5 g; Refill: 0       Vernie Murders, PA  [facility]

## 2016-07-29 NOTE — Patient Instructions (Signed)
Viral Conjunctivitis, Adult Viral conjunctivitis is an inflammation of the clear membrane that covers the white part of your eye and the inner surface of your eyelid (conjunctiva). The inflammation is caused by a viral infection. The blood vessels in the conjunctiva become inflamed, causing the eye to become red or pink, and often itchy. Viral conjunctivitis can be easily passed from one person to another (is contagious). This condition is often called pink eye. What are the causes? This condition is caused by a virus. A virus is a type of contagious germ. It can be spread by touching objects that have been contaminated with the virus, such as doorknobs or towels. It can also be passed through droplets, such as from coughing or sneezing. What are the signs or symptoms? Symptoms of this condition include:  Eye redness.  Tearing or watery eyes.  Itchy and irritated eyes.  Burning feeling in the eyes.  Clear drainage from the eye.  Swollen eyelids.  A gritty feeling in the eye.  Light sensitivity. This condition often occurs with other symptoms, such as a fever, nausea, or a rash. How is this diagnosed? This condition is diagnosed with a medical history and physical exam. If you have discharge from your eye, the discharge may be tested to rule out other causes of conjunctivitis. How is this treated? Viral conjunctivitis does not respond to medicines that kill bacteria (antibiotics). Treatment for viral conjunctivitis is directed at stopping a bacterial infection from developing in addition to the viral infection. Treatment also aims to relieve your symptoms, such as itching. This may be done with antihistamine drops or other eye medicines. Rarely, steroid eye drops or antiviral medicines may be prescribed. Follow these instructions at home: Medicines    Take or apply over-the-counter and prescription medicines only as told by your health care provider.  Be very careful to avoid touching  the edge of the eyelid with the eye drop bottle or ointment tube when applying medicines to the affected eye. Being careful this way will stop you from spreading the infection to the other eye or to other people. Eye care   Avoid touching or rubbing your eyes.  Apply a warm, wet, clean washcloth to your eye for 10-20 minutes, 3-4 times per day or as told by your health care provider.  If you wear contact lenses, do not wear them until the inflammation is gone and your health care provider says it is safe to wear them again. Ask your health care provider how to sterilize or replace your contact lenses before using them again. Wear glasses until you can resume wearing contacts.  Avoid wearing eye makeup until the inflammation is gone. Throw away any old eye cosmetics that may be contaminated.  Gently wipe away any drainage from your eye with a warm, wet washcloth or a cotton ball. General instructions   Change or wash your pillowcase every day or as told by your health care provider.  Do not share towels, pillowcases, washcloths, eye makeup, makeup brushes, contact lenses, or glasses. This may spread the infection.  Wash your hands often with soap and water. Use paper towels to dry your hands. If soap and water are not available, use hand sanitizer.  Try to avoid contact with other people for one week or as told by your health care provider. Contact a health care provider if:  Your symptoms do not improve with treatment or they get worse.  You have increased pain.  Your vision becomes blurry.  You   have a fever.  You have facial pain, redness, or swelling.  You have yellow or green drainage coming from your eye.  You have new symptoms. This information is not intended to replace advice given to you by your health care provider. Make sure you discuss any questions you have with your health care provider. Document Released: 04/06/2002 Document Revised: 08/12/2015 Document Reviewed:  08/01/2015 Elsevier Interactive Patient Education  2017 Elsevier Inc.  

## 2016-07-30 ENCOUNTER — Telehealth: Payer: Self-pay

## 2016-07-30 DIAGNOSIS — H1032 Unspecified acute conjunctivitis, left eye: Secondary | ICD-10-CM

## 2016-07-30 MED ORDER — ERYTHROMYCIN 5 MG/GM OP OINT: 1.0000 "application " | TOPICAL_OINTMENT | Freq: Three times a day (TID) | OPHTHALMIC | 0 refills | Status: DC

## 2016-07-30 NOTE — Telephone Encounter (Signed)
Patient is at [facility] and states she forgot her ointment that was prescribed by Simona Huh yesterday during office visit. She would like it sent to [place] at [facility], spoke with Simona Huh and that was ok. RX sent in-aa

## 2016-08-01 ENCOUNTER — Other Ambulatory Visit: Payer: Medicare Other

## 2016-08-01 ENCOUNTER — Ambulatory Visit: Payer: Medicare Other | Admitting: Oncology

## 2016-08-07 ENCOUNTER — Ambulatory Visit: Payer: Medicare Other

## 2016-08-13 ENCOUNTER — Encounter: Payer: Self-pay | Admitting: Oncology

## 2016-08-13 ENCOUNTER — Inpatient Hospital Stay: Payer: Medicare Other | Attending: Internal Medicine | Admitting: Oncology

## 2016-08-13 ENCOUNTER — Inpatient Hospital Stay: Payer: Medicare Other

## 2016-08-13 VITALS — BP 150/77 | HR 89 | Temp 96.6°F | Resp 18 | Wt 155.8 lb

## 2016-08-13 DIAGNOSIS — Z87891 Personal history of nicotine dependence: Secondary | ICD-10-CM | POA: Insufficient documentation

## 2016-08-13 DIAGNOSIS — Z8572 Personal history of non-Hodgkin lymphomas: Secondary | ICD-10-CM | POA: Diagnosis not present

## 2016-08-13 DIAGNOSIS — C8599 Non-Hodgkin lymphoma, unspecified, extranodal and solid organ sites: Secondary | ICD-10-CM

## 2016-08-13 DIAGNOSIS — Z803 Family history of malignant neoplasm of breast: Secondary | ICD-10-CM | POA: Diagnosis not present

## 2016-08-13 DIAGNOSIS — Z79899 Other long term (current) drug therapy: Secondary | ICD-10-CM | POA: Diagnosis not present

## 2016-08-13 DIAGNOSIS — I10 Essential (primary) hypertension: Secondary | ICD-10-CM

## 2016-08-13 DIAGNOSIS — H16223 Keratoconjunctivitis sicca, not specified as Sjogren's, bilateral: Secondary | ICD-10-CM | POA: Diagnosis not present

## 2016-08-13 DIAGNOSIS — N83209 Unspecified ovarian cyst, unspecified side: Secondary | ICD-10-CM | POA: Diagnosis not present

## 2016-08-13 DIAGNOSIS — K219 Gastro-esophageal reflux disease without esophagitis: Secondary | ICD-10-CM

## 2016-08-13 DIAGNOSIS — Z87440 Personal history of urinary (tract) infections: Secondary | ICD-10-CM | POA: Insufficient documentation

## 2016-08-13 NOTE — Progress Notes (Signed)
Hematology/Oncology Consult note [facility]  Telephone:([phone] [phone]  Patient Care Team: Birdie Sons, MD as PCP - General (Family Medicine) Dingeldein, Remo Lipps, MD as Consulting Physician (Ophthalmology) Cammie Sickle, MD as Consulting Physician (Internal Medicine)   Name of the patient: [place]  [id number]  02/24/1928   Date of visit: 08/13/16  Diagnosis- h/o DLBCL  Chief complaint/ Reason for visit- routine f/u  Heme/Onc history: Patient is a 83 year old female who was treated by Dr. Baxter Hire in the past for diffuse large B-cell lymphoma about 9 years ago status post 6 cycles of R CHOP and has been in remission since then. She was also found to have an ovarian cyst which has remained stable and follows up with Dr. Theora Gianotti for the same  Interval history- She is doing well today. Denies any weight loss or appetite loss. Denies any lumps or bumps anywhere. He denies any drenching night sweats ECOG PS- 0 Pain scale- 0   Review of systems- Review of Systems  Constitutional: Negative for chills, fever, malaise/fatigue and weight loss.  HENT: Negative for congestion, ear discharge and nosebleeds.   Eyes: Negative for blurred vision.  Respiratory: Negative for cough, hemoptysis, sputum production, shortness of breath and wheezing.   Cardiovascular: Negative for chest pain, palpitations, orthopnea and claudication.  Gastrointestinal: Negative for abdominal pain, blood in stool, constipation, diarrhea, heartburn, melena, nausea and vomiting.  Genitourinary: Negative for dysuria, flank pain, frequency, hematuria and urgency.  Musculoskeletal: Negative for back pain, joint pain and myalgias.  Skin: Negative for rash.  Neurological: Negative for dizziness, tingling, focal weakness, seizures, weakness and headaches.  Endo/Heme/Allergies: Does not bruise/bleed easily.  Psychiatric/Behavioral: Negative for depression and  suicidal ideas. The patient does not have insomnia.       Allergies  Allergen Reactions  . Milk-Related Compounds Diarrhea and Nausea And Vomiting  . Sulfa Antibiotics Hives and Itching     Past Medical History:  Diagnosis Date  . Adnexal mass 06/03/2014   since 2009  . 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 GERD (gastroesophageal reflux disease)   . Herpes zoster without complication 03/05/35  . HTN (hypertension)   . Recurrent UTI      Past Surgical History:  Procedure Laterality Date  . ABDOMINAL SURGERY  2009   abdominal mass+ NH lymphoma,  . CATARACT EXTRACTION  1997   right eye  . cervical neck fusion  1995  . DENTAL SURGERY     screws  . laparotomy with biopsy  03/02/2007  . PORTACATH PLACEMENT  2009  . [place]  . SQUAMOUS CELL CARCINOMA EXCISION     right arm  . TOTAL KNEE ARTHROPLASTY Right   . VAGINAL HYSTERECTOMY  1971    Social History   Social History  . Marital status: Widowed    Spouse name: N/A  . Number of children: N/A  . Years of education: N/A   Occupational History  . Not on file.   Social History Main Topics  . Smoking status: Former Smoker    Packs/day: 0.25    Types: Cigarettes    Quit date: 06/21/1989  . Smokeless tobacco: Former Systems developer    Quit date: 01/28/1989  . Alcohol use 8.4 oz/week    14 Glasses of wine per week  . Drug use: No  . Sexual activity: No   Other Topics Concern  .  Not on file   Social History Narrative  . No narrative on file    Family History  Problem Relation Age of Onset  . Breast cancer Sister   . Dementia Sister   . Cataracts Sister   . Heart attack Brother   . CAD Brother   . Heart attack Brother   . Leukemia Grandchild        granddaughter  . Kidney disease Neg Hx   . Bladder Cancer Neg Hx      Current Outpatient Prescriptions:  .  docusate sodium (COLACE) 100 MG capsule, Take 100 mg by mouth 1 day or  1 dose. Reported on 03/27/2015, Disp: , Rfl:  .  albuterol (PROVENTIL HFA;VENTOLIN HFA) 108 (90 Base) MCG/ACT inhaler, Inhale 2 puffs into the lungs every 6 (six) hours as needed for wheezing or shortness of breath. (Patient not taking: Reported on 08/13/2016), Disp: 1 Inhaler, Rfl: 2 .  erythromycin ophthalmic ointment, Place 1 application into the left eye 3 (three) times daily. Use 1/2" ribbon of ointment for each application. (Patient not taking: Reported on 08/13/2016), Disp: 3.5 g, Rfl: 0 .  fexofenadine (ALLEGRA) 180 MG tablet, Take 1 tablet (180 mg total) by mouth daily. (Patient not taking: Reported on 08/13/2016), Disp: 30 tablet, Rfl: 2 .  fluticasone (FLONASE) 50 MCG/ACT nasal spray, Place 2 sprays into both nostrils daily. (Patient not taking: Reported on 08/13/2016), Disp: 16 g, Rfl: 6 .  risedronate (ACTONEL) 35 MG tablet, Take 1 tablet (35 mg total) by mouth every 7 (seven) days. with water on empty stomach, nothing by mouth or lie down for next 30 minutes. (Patient not taking: Reported on 08/13/2016), Disp: 4 tablet, Rfl: 12  Physical exam:  Vitals:   08/13/16 1215  BP: (!) 150/77  Pulse: 89  Resp: 18  Temp: (!) 96.6 F (35.9 C)  TempSrc: Tympanic  Weight: 155 lb 12.8 oz (70.7 kg)   Physical Exam  Constitutional: She is oriented to person, place, and time and well-developed, well-nourished, and in no distress.  HENT:  Head: Normocephalic and atraumatic.  Eyes: Pupils are equal, round, and reactive to light. EOM are normal.  Neck: Normal range of motion.  Cardiovascular: Normal rate, regular rhythm and normal heart sounds.   Pulmonary/Chest: Effort normal and breath sounds normal.  Abdominal: Soft. Bowel sounds are normal.  Lymphadenopathy:  No palpable cervical, supraclavicular or inguinal adenopathy  Neurological: She is alert and oriented to person, place, and time.  Skin: Skin is warm and dry.     CMP Latest Ref Rng & Units 06/07/2016  Glucose 65 - 99 mg/dL 111(H)  BUN  8 - 27 mg/dL 11  Creatinine 0.57 - 1.00 mg/dL 0.73  Sodium 134 - 144 mmol/L 135  Potassium 3.5 - 5.2 mmol/L 5.1  Chloride 96 - 106 mmol/L 92(L)  CO2 18 - 29 mmol/L 27  Calcium 8.7 - 10.3 mg/dL 9.7  Total Protein 6.0 - 8.5 g/dL 7.0  Total Bilirubin 0.0 - 1.2 mg/dL 0.6  Alkaline Phos 39 - 117 IU/L 65  AST 0 - 40 IU/L 16  ALT 0 - 32 IU/L 15   CBC Latest Ref Rng & Units 06/07/2016  WBC 3.4 - 10.8 x10E3/uL 6.7  Hemoglobin 11.1 - 15.9 g/dL 14.2  Hematocrit 34.0 - 46.6 % 42.1  Platelets 150 - 379 x10E3/uL 315    No images are attached to the encounter.  US Pelvis Complete  Result Date: 07/17/2016 CLINICAL DATA:  Left adnexal cyst, follow-up EXAM: TRANSABDOMINAL  ULTRASOUND OF PELVIS TECHNIQUE: Transabdominal ultrasound examination of the pelvis was performed including evaluation of the uterus, ovaries, adnexal regions, and pelvic cul-de-sac. COMPARISON:  08/04/2015 FINDINGS: Uterus Measurements: Prior hysterectomy. Endometrium Thickness: N/A. Right ovary Measurements: Not visualized. No adnexal mass. Left ovary Measurements: Left ovary not definitively seen. Simple appearing adnexal cyst measuring 4.2 x 4.4 x 3.9 cm. This is unchanged in size and appearance since prior study. Other findings:  No abnormal free fluid. IMPRESSION: Stable 4.4 cm simple appearing left adnexal cyst. Electronically Signed   By: Rolm Baptise M.D.   On: 07/17/2016 12:40     Assessment and plan- Patient is a 83 y.o. female with h/o DLBCL 9 years ago  1. From an dlbcl standpoint patient is doing well and clinically there is no evidence of recurrence on today's exam. Recent CBC and CMP was within normal limits. At this point patient didn't continue to follow-up with her primary care doctor and does not need an oncology follow-up at this time  2. Ovarian cyst- Will touch base with Dr. Theora Gianotti to re establish f/u. Recent USG abdomen showe dovaraian cyst was stable   Visit Diagnosis 1. Lymphoma of small bowel ([place])       Dr. Randa Evens, MD, MPH [facility] Pager- [phone] 08/13/2016 3:52 PM

## 2016-08-13 NOTE — Progress Notes (Signed)
Here for follow up

## 2016-08-14 ENCOUNTER — Telehealth: Payer: Self-pay | Admitting: Family Medicine

## 2016-08-14 NOTE — Telephone Encounter (Signed)
ROI signed pt picked up records 06-11-16

## 2016-08-19 DIAGNOSIS — L578 Other skin changes due to chronic exposure to nonionizing radiation: Secondary | ICD-10-CM | POA: Diagnosis not present

## 2016-08-19 DIAGNOSIS — Z85828 Personal history of other malignant neoplasm of skin: Secondary | ICD-10-CM | POA: Diagnosis not present

## 2016-08-19 DIAGNOSIS — L719 Rosacea, unspecified: Secondary | ICD-10-CM | POA: Diagnosis not present

## 2016-08-19 DIAGNOSIS — L820 Inflamed seborrheic keratosis: Secondary | ICD-10-CM | POA: Diagnosis not present

## 2016-08-19 DIAGNOSIS — L570 Actinic keratosis: Secondary | ICD-10-CM | POA: Diagnosis not present

## 2016-08-22 DIAGNOSIS — H16223 Keratoconjunctivitis sicca, not specified as Sjogren's, bilateral: Secondary | ICD-10-CM | POA: Diagnosis not present

## 2016-09-04 ENCOUNTER — Inpatient Hospital Stay: Payer: Medicare Other

## 2016-09-17 ENCOUNTER — Ambulatory Visit (INDEPENDENT_AMBULATORY_CARE_PROVIDER_SITE_OTHER): Payer: Medicare Other | Admitting: Family Medicine

## 2016-09-17 ENCOUNTER — Encounter: Payer: Self-pay | Admitting: Family Medicine

## 2016-09-17 VITALS — BP 122/64 | Temp 98.0°F | Resp 16 | Wt 157.0 lb

## 2016-09-17 DIAGNOSIS — N3001 Acute cystitis with hematuria: Secondary | ICD-10-CM

## 2016-09-17 LAB — POCT URINALYSIS DIPSTICK
Bilirubin, UA: NEGATIVE
Glucose, UA: NEGATIVE
Ketones, UA: NEGATIVE
Nitrite, UA: POSITIVE
Protein, UA: NEGATIVE
Spec Grav, UA: 1.01 (ref 1.010–1.025)
Urobilinogen, UA: 0.2 E.U./dL
pH, UA: 7 (ref 5.0–8.0)

## 2016-09-17 MED ORDER — CIPROFLOXACIN HCL 500 MG PO TABS: 500.0000 mg | ORAL_TABLET | Freq: Two times a day (BID) | ORAL | 0 refills | Status: AC

## 2016-09-17 NOTE — Progress Notes (Signed)
Patient: Monica Singh    DOB: November 09, 1928   83 y.o.   MRN: [id number] Visit Date: 09/17/2016  Today's Provider: Lelon Huh, MD   Chief Complaint  Patient presents with  . Abdominal Pain   Subjective:    HPI Patient comes in today c/o lower abdominal pain X 2 days. She reports that she also has burning on urination and lower back pain. She has taken AZO with minimal relief.     Allergies  Allergen Reactions  . Milk-Related Compounds Diarrhea and Nausea And Vomiting  . Sulfa Antibiotics Hives and Itching     Current Outpatient Prescriptions:  .  docusate sodium (COLACE) 100 MG capsule, Take 100 mg by mouth 1 day or 1 dose. Reported on 03/27/2015, Disp: , Rfl:  .  fexofenadine (ALLEGRA) 180 MG tablet, Take 1 tablet (180 mg total) by mouth daily., Disp: 30 tablet, Rfl: 2 .  albuterol (PROVENTIL HFA;VENTOLIN HFA) 108 (90 Base) MCG/ACT inhaler, Inhale 2 puffs into the lungs every 6 (six) hours as needed for wheezing or shortness of breath. (Patient not taking: Reported on 08/13/2016), Disp: 1 Inhaler, Rfl: 2 .  erythromycin ophthalmic ointment, Place 1 application into the left eye 3 (three) times daily. Use 1/2" ribbon of ointment for each application. (Patient not taking: Reported on 08/13/2016), Disp: 3.5 g, Rfl: 0 .  fluticasone (FLONASE) 50 MCG/ACT nasal spray, Place 2 sprays into both nostrils daily. (Patient not taking: Reported on 08/13/2016), Disp: 16 g, Rfl: 6 .  risedronate (ACTONEL) 35 MG tablet, Take 1 tablet (35 mg total) by mouth every 7 (seven) days. with water on empty stomach, nothing by mouth or lie down for next 30 minutes. (Patient not taking: Reported on 08/13/2016), Disp: 4 tablet, Rfl: 12  Review of Systems  Constitutional: Positive for fatigue.  Gastrointestinal: Positive for abdominal pain.  Genitourinary: Positive for dysuria, flank pain, frequency, pelvic pain and urgency. Negative for difficulty urinating, hematuria, vaginal bleeding,  vaginal discharge and vaginal pain.  Neurological: Negative.     Social History  Substance Use Topics  . Smoking status: Former Smoker    Packs/day: 0.25    Types: Cigarettes    Quit date: 06/21/1989  . Smokeless tobacco: Former Systems developer    Quit date: 01/28/1989  . Alcohol use 8.4 oz/week    14 Glasses of wine per week   Objective:   BP 122/64 (BP Location: Right Arm, Patient Position: Sitting, Cuff Size: Normal)   Temp 98 F (36.7 C)   Resp 16   Wt 157 lb (71.2 kg)   BMI 28.72 kg/m  Vitals:   09/17/16 1456  BP: 122/64  Resp: 16  Temp: 98 F (36.7 C)  Weight: 157 lb (71.2 kg)     Physical Exam  General appearance: alert, well developed, well nourished, cooperative and in no distress Head: Normocephalic, without obvious abnormality, atraumatic Respiratory: Respirations even and unlabored, normal respiratory rate Extremities: No gross deformities Skin: Skin color, texture, turgor normal. No rashes seen  Psych: Appropriate mood and affect. Neurologic: Mental status: Alert, oriented to person, place, and time, thought content appropriate.   Results for orders placed or performed in visit on 09/17/16  POCT urinalysis dipstick  Result Value Ref Range   Color, UA yellow    Clarity, UA cloudy    Glucose, UA negative    Bilirubin, UA negative    Ketones, UA negative    Spec Grav, UA 1.010 1.010 - 1.025   Blood,  UA hemolyzed small    pH, UA 7.0 5.0 - 8.0   Protein, UA negative    Urobilinogen, UA 0.2 0.2 or 1.0 E.U./dL   Nitrite, UA positive    Leukocytes, UA Large (3+) (A) Negative       Assessment & Plan:     1. Acute cystitis with hematuria   - POCT urinalysis dipstick - Urine Culture - ciprofloxacin (CIPRO) 500 MG tablet; Take 1 tablet (500 mg total) by mouth 2 (two) times daily.  Dispense: 14 tablet; Refill: 0       Lelon Huh, MD  [facility]

## 2016-09-19 LAB — URINE CULTURE

## 2016-11-12 ENCOUNTER — Encounter: Payer: Self-pay | Admitting: Family Medicine

## 2016-11-12 ENCOUNTER — Ambulatory Visit (INDEPENDENT_AMBULATORY_CARE_PROVIDER_SITE_OTHER): Payer: Medicare Other | Admitting: Family Medicine

## 2016-11-12 VITALS — BP 120/70 | HR 93 | Temp 98.1°F | Resp 16 | Ht 62.0 in | Wt 154.0 lb

## 2016-11-12 DIAGNOSIS — R197 Diarrhea, unspecified: Secondary | ICD-10-CM

## 2016-11-12 DIAGNOSIS — Z23 Encounter for immunization: Secondary | ICD-10-CM | POA: Diagnosis not present

## 2016-11-12 DIAGNOSIS — R140 Abdominal distension (gaseous): Secondary | ICD-10-CM

## 2016-11-12 NOTE — Progress Notes (Signed)
Patient: Monica Singh    DOB: Apr 17, 1928   83 y.o.   MRN: [id number] Visit Date: 11/12/2016  Today's Provider: Lelon Huh, MD   Chief Complaint  Patient presents with  . Nausea  . Diarrhea   Subjective:    Patient has had nausea and diarrhea off and on for several weeks. Patient states every time she eats she feels nauseous. Diarrhea is unpredictable when it occurs. Patient has noticed mucous in stool. Patient states she has had symptoms of bloating, headaches, vomiting, muscle and joint aches.     Diarrhea   This is a new problem. The current episode started 1 to 4 weeks ago. The stool consistency is described as mucous. The patient states that diarrhea does not awaken her from sleep. Associated symptoms include arthralgias, bloating, headaches, myalgias and vomiting. Pertinent negatives include no abdominal pain, chills, coughing, fever, increased  flatus, sweats, URI or weight loss. Nothing aggravates the symptoms. Risk factors include suspect food intake. She has tried nothing for the symptoms.   She states that she has had persistent nausea for the last year, but over the last few weeks has been having frequent diarrhea and very fatigued. Is having vomiting a few times a week if she eats too much. No specific abdominal. No blood in stool. Having BMs a few times a day.    Allergies  Allergen Reactions  . Milk-Related Compounds Diarrhea and Nausea And Vomiting  . Sulfa Antibiotics Hives and Itching     Current Outpatient Prescriptions:  .  albuterol (PROVENTIL HFA;VENTOLIN HFA) 108 (90 Base) MCG/ACT inhaler, Inhale 2 puffs into the lungs every 6 (six) hours as needed for wheezing or shortness of breath., Disp: 1 Inhaler, Rfl: 2 .  docusate sodium (COLACE) 100 MG capsule, Take 100 mg by mouth 1 day or 1 dose. Reported on 03/27/2015, Disp: , Rfl:  .  erythromycin ophthalmic ointment, Place 1 application into the left eye 3 (three) times daily. Use 1/2" ribbon  of ointment for each application., Disp: 3.5 g, Rfl: 0 .  fexofenadine (ALLEGRA) 180 MG tablet, Take 1 tablet (180 mg total) by mouth daily., Disp: 30 tablet, Rfl: 2 .  fluticasone (FLONASE) 50 MCG/ACT nasal spray, Place 2 sprays into both nostrils daily., Disp: 16 g, Rfl: 6 .  risedronate (ACTONEL) 35 MG tablet, Take 1 tablet (35 mg total) by mouth every 7 (seven) days. with water on empty stomach, nothing by mouth or lie down for next 30 minutes., Disp: 4 tablet, Rfl: 12  Review of Systems  Constitutional: Negative for appetite change, chills, fatigue, fever and weight loss.  Respiratory: Negative for cough, chest tightness and shortness of breath.   Cardiovascular: Negative for chest pain and palpitations.  Gastrointestinal: Positive for abdominal distention, bloating, diarrhea, nausea and vomiting. Negative for abdominal pain and flatus.  Musculoskeletal: Positive for arthralgias and myalgias.  Neurological: Positive for headaches. Negative for dizziness and weakness.    Social History  Substance Use Topics  . Smoking status: Former Smoker    Packs/day: 0.25    Types: Cigarettes    Quit date: 06/21/1989  . Smokeless tobacco: Former Systems developer    Quit date: 01/28/1989  . Alcohol use 8.4 oz/week    14 Glasses of wine per week   Objective:   BP 120/70 (BP Location: Right Arm, Patient Position: Sitting, Cuff Size: Normal)   Pulse 93   Temp 98.1 F (36.7 C) (Oral)   Resp 16   Ht  5\' 2"  (1.575 m)   Wt 154 lb (69.9 kg)   SpO2 96%   BMI 28.17 kg/m  Vitals:   11/12/16 1015  BP: 120/70  Pulse: 93  Resp: 16  Temp: 98.1 F (36.7 C)  TempSrc: Oral  SpO2: 96%  Weight: 154 lb (69.9 kg)  Height: 5\' 2"  (1.575 m)     Physical Exam  General Appearance:    Alert, cooperative, no distress  Eyes:    PERRL, conjunctiva/corneas clear, EOM's intact       Lungs:     Clear to auscultation bilaterally, respirations unlabored  Heart:    Regular rate and rhythm  Abdomen:   bowel sounds present  and normal in all 4 quadrants, soft, round, nontender or nondistended. No CVA tenderness       Assessment & Plan:     1. Diarrhea, unspecified type Try OTC Probiotics. Consider stool studies - CBC - COMPLETE METABOLIC PANEL WITH GFR - TSH - Celiac Disease Comprehensive Panel with Reflexes  2. Bloating  - CBC - COMPLETE METABOLIC PANEL WITH GFR - TSH - Celiac Disease Comprehensive Panel with Reflexes  3. Need for influenza vaccination  - Flu vaccine HIGH DOSE PF       Lelon Huh, MD  [facility]

## 2016-11-12 NOTE — Patient Instructions (Signed)
   Start taking daily probiotic such as Electronics engineer

## 2016-11-13 LAB — COMPLETE METABOLIC PANEL WITH GFR
AG Ratio: 1.7 (calc) (ref 1.0–2.5)
ALT: 13 U/L (ref 6–29)
AST: 14 U/L (ref 10–35)
Albumin: 4.5 g/dL (ref 3.6–5.1)
Alkaline phosphatase (APISO): 61 U/L (ref 33–130)
BUN: 11 mg/dL (ref 7–25)
CO2: 31 mmol/L (ref 20–32)
Calcium: 9.7 mg/dL (ref 8.6–10.4)
Chloride: 97 mmol/L — ABNORMAL LOW (ref 98–110)
Creat: 0.74 mg/dL (ref 0.60–0.88)
GFR, Est African American: 85 mL/min/{1.73_m2} (ref >=60)
GFR, Est Non African American: 73 mL/min/{1.73_m2} (ref >=60)
Globulin: 2.6 g/dL (calc) (ref 1.9–3.7)
Glucose, Bld: 90 mg/dL (ref 65–139)
Potassium: 4.5 mmol/L (ref 3.5–5.3)
Sodium: 135 mmol/L (ref 135–146)
Total Bilirubin: 0.6 mg/dL (ref 0.2–1.2)
Total Protein: 7.1 g/dL (ref 6.1–8.1)

## 2016-11-13 LAB — CELIAC DISEASE COMPREHENSIVE PANEL WITH REFLEXES
(tTG) Ab, IgA: 1 U/mL
Immunoglobulin A: 220 mg/dL (ref 81–463)

## 2016-11-13 LAB — CBC
HCT: 42.5 % (ref 35.0–45.0)
Hemoglobin: 14.2 g/dL (ref 11.7–15.5)
MCH: 31.2 pg (ref 27.0–33.0)
MCHC: 33.4 g/dL (ref 32.0–36.0)
MCV: 93.4 fL (ref 80.0–100.0)
MPV: 9.6 fL (ref 7.5–12.5)
Platelets: 313 10*3/uL (ref 140–400)
RBC: 4.55 10*6/uL (ref 3.80–5.10)
RDW: 11.7 % (ref 11.0–15.0)
WBC: 6.1 10*3/uL (ref 3.8–10.8)

## 2016-11-13 LAB — TSH: TSH: 2.36 mIU/L (ref 0.40–4.50)

## 2016-11-18 ENCOUNTER — Other Ambulatory Visit: Payer: Self-pay | Admitting: Family Medicine

## 2016-11-18 DIAGNOSIS — J441 Chronic obstructive pulmonary disease with (acute) exacerbation: Secondary | ICD-10-CM

## 2016-11-18 MED ORDER — ALBUTEROL SULFATE HFA 108 (90 BASE) MCG/ACT IN AERS: 2.0000 | INHALATION_SPRAY | Freq: Four times a day (QID) | RESPIRATORY_TRACT | 5 refills | Status: DC | PRN

## 2016-11-18 NOTE — Telephone Encounter (Signed)
[facility] faxed a request for the following medication. Thanks CC   albuterol (PROVENTIL HFA;VENTOLIN HFA) 108 (90 Base) MCG/ACT inhaler  >inhale 2 puffs into the lungs every 6 hours as needed for shortness of breath or wheezing.

## 2016-11-20 DIAGNOSIS — L570 Actinic keratosis: Secondary | ICD-10-CM | POA: Diagnosis not present

## 2016-11-20 DIAGNOSIS — L578 Other skin changes due to chronic exposure to nonionizing radiation: Secondary | ICD-10-CM | POA: Diagnosis not present

## 2016-12-16 ENCOUNTER — Encounter: Payer: Self-pay | Admitting: Family Medicine

## 2016-12-16 ENCOUNTER — Ambulatory Visit (INDEPENDENT_AMBULATORY_CARE_PROVIDER_SITE_OTHER): Payer: Medicare Other | Admitting: Family Medicine

## 2016-12-16 VITALS — BP 118/60 | HR 89 | Temp 97.9°F | Resp 16 | Wt 152.0 lb

## 2016-12-16 DIAGNOSIS — J449 Chronic obstructive pulmonary disease, unspecified: Secondary | ICD-10-CM

## 2016-12-16 DIAGNOSIS — J40 Bronchitis, not specified as acute or chronic: Secondary | ICD-10-CM

## 2016-12-16 DIAGNOSIS — N390 Urinary tract infection, site not specified: Secondary | ICD-10-CM

## 2016-12-16 LAB — POCT URINALYSIS DIPSTICK
Glucose, UA: NEGATIVE
Ketones, UA: NEGATIVE
Nitrite, UA: POSITIVE
Spec Grav, UA: 1.01 (ref 1.010–1.025)
Urobilinogen, UA: 1 E.U./dL
pH, UA: 8 (ref 5.0–8.0)

## 2016-12-16 MED ORDER — LEVOFLOXACIN 500 MG PO TABS: 500.0000 mg | ORAL_TABLET | Freq: Every day | ORAL | 0 refills | Status: DC

## 2016-12-16 MED ORDER — CIPROFLOXACIN HCL 500 MG PO TABS: 500.0000 mg | ORAL_TABLET | Freq: Two times a day (BID) | ORAL | 0 refills | Status: DC

## 2016-12-16 NOTE — Progress Notes (Signed)
Patient: Monica Singh    DOB: 07-07-1928   83 y.o.   MRN: [id number] Visit Date: 12/16/2016  Today's Provider: Lelon Huh, MD   Chief Complaint  Patient presents with  . Dysuria  . Cough   Subjective:    Dysuria   This is a new problem. Episode onset: 1 week ago. The problem has been unchanged. The quality of the pain is described as burning. There has been no fever. Pertinent negatives include no chills, frequency, hematuria, nausea or vomiting. Associated symptoms comments: Foul odor in urine, cloudy urine. She has tried nothing for the symptoms.  Cough  This is a new problem. Episode onset: 1 week ago. The problem has been unchanged. The cough is productive of sputum (thick yellow colored). Associated symptoms include postnasal drip and shortness of breath. Pertinent negatives include no chills, ear congestion, ear pain, fever, nasal congestion or rhinorrhea. Treatments tried: OTC Cold and cough medication. The treatment provided no relief.       Allergies  Allergen Reactions  . Milk-Related Compounds Diarrhea and Nausea And Vomiting  . Sulfa Antibiotics Hives and Itching     Current Outpatient Medications:  .  albuterol (PROVENTIL HFA;VENTOLIN HFA) 108 (90 Base) MCG/ACT inhaler, Inhale 2 puffs into the lungs every 6 (six) hours as needed for wheezing or shortness of breath., Disp: 1 Inhaler, Rfl: 5 .  docusate sodium (COLACE) 100 MG capsule, Take 100 mg by mouth 1 day or 1 dose. Reported on 03/27/2015, Disp: , Rfl:  .  fexofenadine (ALLEGRA) 180 MG tablet, Take 1 tablet (180 mg total) by mouth daily., Disp: 30 tablet, Rfl: 2 .  fluticasone (FLONASE) 50 MCG/ACT nasal spray, Place 2 sprays into both nostrils daily., Disp: 16 g, Rfl: 6 .  risedronate (ACTONEL) 35 MG tablet, Take 1 tablet (35 mg total) by mouth every 7 (seven) days. with water on empty stomach, nothing by mouth or lie down for next 30 minutes., Disp: 4 tablet, Rfl: 12  Review of Systems    Constitutional: Negative for chills and fever.  HENT: Positive for postnasal drip. Negative for ear pain and rhinorrhea.   Respiratory: Positive for cough and shortness of breath.   Gastrointestinal: Negative for nausea and vomiting.  Genitourinary: Positive for dysuria. Negative for frequency and hematuria.    Social History   Tobacco Use  . Smoking status: Former Smoker    Packs/day: 0.25    Types: Cigarettes    Last attempt to quit: 06/21/1989    Years since quitting: 27.5  . Smokeless tobacco: Former Systems developer    Quit date: 01/28/1989  Substance Use Topics  . Alcohol use: Yes    Alcohol/week: 8.4 oz    Types: 14 Glasses of wine per week   Objective:   BP 118/60 (BP Location: Left Arm, Patient Position: Sitting, Cuff Size: Normal)   Pulse 89   Temp 97.9 F (36.6 C) (Oral)   Resp 16   Wt 152 lb (68.9 kg)   SpO2 95% Comment: room air  BMI 27.80 kg/m  Vitals:   12/16/16 1341  BP: 118/60  Pulse: 89  Resp: 16  Temp: 97.9 F (36.6 C)  TempSrc: Oral  SpO2: 95%  Weight: 152 lb (68.9 kg)     Physical Exam  General Appearance:    Alert, cooperative, no distress  HENT:   left TM normal without fluid or infection, neck without nodes, throat normal without erythema or exudate and sinuses nontender  Eyes:  PERRL, conjunctiva/corneas clear, EOM's intact       Lungs:     Occasional expiratory wheeze respirations unlabored  Heart:    Regular rate and rhythm  Abd::   Mild suprapubic tenderness, no CVAT          Assessment & Plan:     1. Urinary tract infection without hematuria, site unspecified  - POCT Urinalysis Dipstick - Urine Culture; Future - Urine Culture - levofloxacin (LEVAQUIN) 500 MG tablet; Take 1 tablet (500 mg total) daily by mouth.  Dispense: 7 tablet; Refill: 0  2. Bronchitis  - levofloxacin (LEVAQUIN) 500 MG tablet; Take 1 tablet (500 mg total) daily by mouth.  Dispense: 7 tablet; Refill: 0  3. Chronic obstructive pulmonary disease, unspecified COPD  type ([place])   Call if symptoms change or if not rapidly improving.        The entirety of the information documented in the History of Present Illness, Review of Systems and Physical Exam were personally obtained by me. Portions of this information were initially documented by April M. Sabra Heck, CMA and reviewed by me for thoroughness and accuracy.    Lelon Huh, MD  [facility]

## 2016-12-19 LAB — URINE CULTURE
MICRO NUMBER:: 81303997
SPECIMEN QUALITY:: ADEQUATE

## 2016-12-23 ENCOUNTER — Telehealth: Payer: Self-pay | Admitting: Family Medicine

## 2016-12-23 NOTE — Telephone Encounter (Signed)
Please advise 

## 2016-12-23 NOTE — Telephone Encounter (Signed)
Pt states she was seen 12/16/16 by Dr Caryn Section.  She still have sinus congestion and cough.  Pt is asking if she can get a Rx to help with this.  [place].  CB#[phone]/MW

## 2016-12-24 MED ORDER — PREDNISONE 10 MG PO TABS: ORAL_TABLET | ORAL | 0 refills | Status: AC

## 2016-12-24 NOTE — Telephone Encounter (Signed)
Patient was advised.  

## 2016-12-24 NOTE — Telephone Encounter (Signed)
Have sent prescription for prednisone to her pharmacy

## 2017-01-03 DIAGNOSIS — Z961 Presence of intraocular lens: Secondary | ICD-10-CM | POA: Diagnosis not present

## 2017-01-28 HISTORY — PX: BACK SURGERY: SHX140

## 2017-02-10 ENCOUNTER — Other Ambulatory Visit: Payer: Self-pay | Admitting: *Deleted

## 2017-02-10 ENCOUNTER — Ambulatory Visit (INDEPENDENT_AMBULATORY_CARE_PROVIDER_SITE_OTHER): Payer: Medicare Other | Admitting: Family Medicine

## 2017-02-10 ENCOUNTER — Encounter: Payer: Self-pay | Admitting: Family Medicine

## 2017-02-10 VITALS — BP 124/70 | HR 89 | Temp 97.9°F | Resp 16 | Wt 155.0 lb

## 2017-02-10 DIAGNOSIS — R350 Frequency of micturition: Secondary | ICD-10-CM | POA: Diagnosis not present

## 2017-02-10 DIAGNOSIS — N390 Urinary tract infection, site not specified: Secondary | ICD-10-CM | POA: Diagnosis not present

## 2017-02-10 LAB — POCT URINALYSIS DIPSTICK
Bilirubin, UA: NEGATIVE
Blood, UA: NEGATIVE
Glucose, UA: NEGATIVE
Ketones, UA: NEGATIVE
Nitrite, UA: POSITIVE
Protein, UA: NEGATIVE
Spec Grav, UA: 1.01 (ref 1.010–1.025)
Urobilinogen, UA: 0.2 E.U./dL
pH, UA: 6 (ref 5.0–8.0)

## 2017-02-10 MED ORDER — CIPROFLOXACIN HCL 100 MG PO TABS: 100.0000 mg | ORAL_TABLET | Freq: Two times a day (BID) | ORAL | 0 refills | Status: DC

## 2017-02-10 MED ORDER — CIPROFLOXACIN HCL 250 MG PO TABS: 250.0000 mg | ORAL_TABLET | Freq: Two times a day (BID) | ORAL | 0 refills | Status: DC

## 2017-02-10 NOTE — Progress Notes (Signed)
Patient: Monica Singh    DOB: [birth year]   84 y.o.   MRN: [id number] Visit Date: 02/10/2017  Today's Provider: Lelon Huh, MD   Chief Complaint  Patient presents with  . Urinary Frequency   Subjective:    Patient has had pelvic pressure, urine frequency, and urgency for 1 week. Patient also has some left side flank pain and decreased urine. Patient states she took Azo yesterday and throw it back up. No fever.    Urinary Frequency   This is a new problem. The current episode started in the past 7 days. The problem occurs every urination. The quality of the pain is described as aching. The pain is at a severity of 7/10. The pain is moderate. There has been no fever. Associated symptoms include flank pain, frequency, hesitancy, urgency and vomiting. Pertinent negatives include no chills, discharge, hematuria, nausea, possible pregnancy or sweats. Treatments tried: Azo. The treatment provided mild relief.       Allergies  Allergen Reactions  . Milk-Related Compounds Diarrhea and Nausea And Vomiting  . Sulfa Antibiotics Hives and Itching     Current Outpatient Medications:  .  albuterol (PROVENTIL HFA;VENTOLIN HFA) 108 (90 Base) MCG/ACT inhaler, Inhale 2 puffs into the lungs every 6 (six) hours as needed for wheezing or shortness of breath., Disp: 1 Inhaler, Rfl: 5 .  docusate sodium (COLACE) 100 MG capsule, Take 100 mg by mouth 1 day or 1 dose. Reported on 03/27/2015, Disp: , Rfl:  .  fexofenadine (ALLEGRA) 180 MG tablet, Take 1 tablet (180 mg total) by mouth daily., Disp: 30 tablet, Rfl: 2 .  fluticasone (FLONASE) 50 MCG/ACT nasal spray, Place 2 sprays into both nostrils daily., Disp: 16 g, Rfl: 6 .  risedronate (ACTONEL) 35 MG tablet, Take 1 tablet (35 mg total) by mouth every 7 (seven) days. with water on empty stomach, nothing by mouth or lie down for next 30 minutes., Disp: 4 tablet, Rfl: 12 .  levofloxacin (LEVAQUIN) 500 MG tablet, Take 1 tablet (500 mg  total) daily by mouth. (Patient not taking: Reported on 02/10/2017), Disp: 7 tablet, Rfl: 0  Review of Systems  Constitutional: Negative for appetite change, chills, fatigue and fever.  Respiratory: Negative for chest tightness and shortness of breath.   Cardiovascular: Negative for chest pain and palpitations.  Gastrointestinal: Positive for vomiting. Negative for abdominal pain and nausea.  Genitourinary: Positive for difficulty urinating, flank pain, frequency, hesitancy and urgency. Negative for hematuria.  Neurological: Negative for dizziness and weakness.    Social History   Tobacco Use  . Smoking status: Former Smoker    Packs/day: 0.25    Types: Cigarettes    Last attempt to quit: 06/21/1989    Years since quitting: 27.6  . Smokeless tobacco: Former Systems developer    Quit date: 01/28/1989  Substance Use Topics  . Alcohol use: Yes    Alcohol/week: 8.4 oz    Types: 14 Glasses of wine per week   Objective:   BP 124/70 (BP Location: Left Arm, Patient Position: Sitting, Cuff Size: Normal)   Pulse 89   Temp 97.9 F (36.6 C) (Oral)   Resp 16   Wt 155 lb (70.3 kg)   SpO2 92%   BMI 28.35 kg/m  Vitals:   02/10/17 1511  BP: 124/70  Pulse: 89  Resp: 16  Temp: 97.9 F (36.6 C)  TempSrc: Oral  SpO2: 92%  Weight: 155 lb (70.3 kg)     Physical Exam  General appearance: alert, well developed, well nourished, cooperative and in no distress Head: Normocephalic, without obvious abnormality, atraumatic Respiratory: Respirations even and unlabored, normal respiratory rate Extremities: No gross deformities Skin: Skin color, texture, turgor normal. No rashes seen  Psych: Appropriate mood and affect. Neurologic: Mental status: Alert, oriented to person, place, and time, thought content appropriate. Results for orders placed or performed in visit on 02/10/17  POCT urinalysis dipstick  Result Value Ref Range   Color, UA yellow    Clarity, UA cloudy    Glucose, UA Neg    Bilirubin, UA  Neg    Ketones, UA Neg    Spec Grav, UA 1.010 1.010 - 1.025   Blood, UA Neg    pH, UA 6.0 5.0 - 8.0   Protein, UA Neg    Urobilinogen, UA 0.2 0.2 or 1.0 E.U./dL   Nitrite, UA Positive    Leukocytes, UA Large (3+) (A) Negative   Appearance cloudy    Odor none        Assessment & Plan:     1. Frequency of urination  - POCT urinalysis dipstick  2. Urinary tract infection without hematuria, site unspecified  - CULTURE, URINE COMPREHENSIVE - ciprofloxacin (CIPRO) 100 MG tablet; Take 1 tablet (100 mg total) by mouth 2 (two) times daily for 7 days.  Dispense: 14 tablet; Refill: 0       Lelon Huh, MD  [facility]

## 2017-02-12 LAB — CULTURE, URINE COMPREHENSIVE

## 2017-02-16 DIAGNOSIS — W19XXXA Unspecified fall, initial encounter: Secondary | ICD-10-CM | POA: Diagnosis not present

## 2017-02-16 DIAGNOSIS — M545 Low back pain: Secondary | ICD-10-CM | POA: Diagnosis not present

## 2017-02-17 ENCOUNTER — Emergency Department: Payer: Medicare Other

## 2017-02-17 ENCOUNTER — Encounter
Admission: RE | Admit: 2017-02-17 | Discharge: 2017-02-17 | Disposition: A | Discharge status: Home / Self Care | Payer: Medicare Other | Source: Ambulatory Visit | Attending: Internal Medicine | Admitting: Internal Medicine

## 2017-02-17 ENCOUNTER — Other Ambulatory Visit: Payer: Self-pay

## 2017-02-17 ENCOUNTER — Emergency Department
Admission: EM | Admit: 2017-02-17 | Discharge: 2017-02-18 | Disposition: A | Discharge status: Home / Self Care | Payer: Medicare Other | Attending: Emergency Medicine | Admitting: Emergency Medicine

## 2017-02-17 ENCOUNTER — Encounter: Payer: Self-pay | Admitting: Emergency Medicine

## 2017-02-17 DIAGNOSIS — W19XXXA Unspecified fall, initial encounter: Secondary | ICD-10-CM

## 2017-02-17 DIAGNOSIS — S32001A Stable burst fracture of unspecified lumbar vertebra, initial encounter for closed fracture: Secondary | ICD-10-CM | POA: Insufficient documentation

## 2017-02-17 DIAGNOSIS — Z87891 Personal history of nicotine dependence: Secondary | ICD-10-CM | POA: Insufficient documentation

## 2017-02-17 DIAGNOSIS — Z79899 Other long term (current) drug therapy: Secondary | ICD-10-CM | POA: Insufficient documentation

## 2017-02-17 DIAGNOSIS — W0110XA Fall on same level from slipping, tripping and stumbling with subsequent striking against unspecified object, initial encounter: Secondary | ICD-10-CM | POA: Insufficient documentation

## 2017-02-17 DIAGNOSIS — C8590 Non-Hodgkin lymphoma, unspecified, unspecified site: Secondary | ICD-10-CM | POA: Insufficient documentation

## 2017-02-17 DIAGNOSIS — Z85828 Personal history of other malignant neoplasm of skin: Secondary | ICD-10-CM | POA: Insufficient documentation

## 2017-02-17 DIAGNOSIS — N390 Urinary tract infection, site not specified: Secondary | ICD-10-CM | POA: Insufficient documentation

## 2017-02-17 DIAGNOSIS — I10 Essential (primary) hypertension: Secondary | ICD-10-CM | POA: Diagnosis not present

## 2017-02-17 DIAGNOSIS — R102 Pelvic and perineal pain: Secondary | ICD-10-CM | POA: Diagnosis not present

## 2017-02-17 DIAGNOSIS — Z96651 Presence of right artificial knee joint: Secondary | ICD-10-CM | POA: Diagnosis not present

## 2017-02-17 DIAGNOSIS — M545 Low back pain, unspecified: Secondary | ICD-10-CM

## 2017-02-17 DIAGNOSIS — J449 Chronic obstructive pulmonary disease, unspecified: Secondary | ICD-10-CM | POA: Insufficient documentation

## 2017-02-17 DIAGNOSIS — Z9181 History of falling: Secondary | ICD-10-CM | POA: Insufficient documentation

## 2017-02-17 DIAGNOSIS — S32012A Unstable burst fracture of first lumbar vertebra, initial encounter for closed fracture: Secondary | ICD-10-CM | POA: Diagnosis not present

## 2017-02-17 DIAGNOSIS — S3993XA Unspecified injury of pelvis, initial encounter: Secondary | ICD-10-CM | POA: Diagnosis not present

## 2017-02-17 DIAGNOSIS — S32009A Unspecified fracture of unspecified lumbar vertebra, initial encounter for closed fracture: Secondary | ICD-10-CM | POA: Insufficient documentation

## 2017-02-17 DIAGNOSIS — S3992XA Unspecified injury of lower back, initial encounter: Secondary | ICD-10-CM | POA: Diagnosis present

## 2017-02-17 DIAGNOSIS — Y999 Unspecified external cause status: Secondary | ICD-10-CM | POA: Diagnosis not present

## 2017-02-17 DIAGNOSIS — E039 Hypothyroidism, unspecified: Secondary | ICD-10-CM | POA: Diagnosis not present

## 2017-02-17 DIAGNOSIS — Y929 Unspecified place or not applicable: Secondary | ICD-10-CM | POA: Diagnosis not present

## 2017-02-17 DIAGNOSIS — Y939 Activity, unspecified: Secondary | ICD-10-CM | POA: Insufficient documentation

## 2017-02-17 DIAGNOSIS — M546 Pain in thoracic spine: Secondary | ICD-10-CM | POA: Diagnosis not present

## 2017-02-17 HISTORY — DX: Non-Hodgkin lymphoma, unspecified, unspecified site: C85.90

## 2017-02-17 LAB — CBC WITH DIFFERENTIAL/PLATELET
Basophils Absolute: 0.1 10*3/uL (ref 0–0.1)
Basophils Relative: 0 %
Eosinophils Absolute: 0.1 10*3/uL (ref 0–0.7)
Eosinophils Relative: 1 %
HCT: 40.5 % (ref 35.0–47.0)
Hemoglobin: 13.7 g/dL (ref 12.0–16.0)
Lymphocytes Relative: 5 %
Lymphs Abs: 0.8 10*3/uL — ABNORMAL LOW (ref 1.0–3.6)
MCH: 32.7 pg (ref 26.0–34.0)
MCHC: 33.8 g/dL (ref 32.0–36.0)
MCV: 96.9 fL (ref 80.0–100.0)
Monocytes Absolute: 0.9 10*3/uL (ref 0.2–0.9)
Monocytes Relative: 5 %
Neutro Abs: 14.5 10*3/uL — ABNORMAL HIGH (ref 1.4–6.5)
Neutrophils Relative %: 89 %
Platelets: 295 10*3/uL (ref 150–440)
RBC: 4.18 MIL/uL (ref 3.80–5.20)
RDW: 13.7 % (ref 11.5–14.5)
WBC: 16.3 10*3/uL — ABNORMAL HIGH (ref 3.6–11.0)

## 2017-02-17 LAB — URINALYSIS, COMPLETE (UACMP) WITH MICROSCOPIC
Bacteria, UA: NONE SEEN
Bilirubin Urine: NEGATIVE
Glucose, UA: NEGATIVE mg/dL
Hgb urine dipstick: NEGATIVE
Ketones, ur: 5 mg/dL — AB
Leukocytes, UA: NEGATIVE
Nitrite: NEGATIVE
Protein, ur: NEGATIVE mg/dL
Specific Gravity, Urine: 1.012 (ref 1.005–1.030)
pH: 5 (ref 5.0–8.0)

## 2017-02-17 LAB — COMPREHENSIVE METABOLIC PANEL
ALT: 19 U/L (ref 14–54)
AST: 23 U/L (ref 15–41)
Albumin: 3.9 g/dL (ref 3.5–5.0)
Alkaline Phosphatase: 55 U/L (ref 38–126)
Anion gap: 11 (ref 5–15)
BUN: 14 mg/dL (ref 6–20)
CO2: 26 mmol/L (ref 22–32)
Calcium: 8.8 mg/dL — ABNORMAL LOW (ref 8.9–10.3)
Chloride: 99 mmol/L — ABNORMAL LOW (ref 101–111)
Creatinine, Ser: 0.79 mg/dL (ref 0.44–1.00)
GFR calc Af Amer: >60 mL/min (ref >60)
GFR calc non Af Amer: >60 mL/min (ref >60)
Glucose, Bld: 143 mg/dL — ABNORMAL HIGH (ref 65–99)
Potassium: 3.6 mmol/L (ref 3.5–5.1)
Sodium: 136 mmol/L (ref 135–145)
Total Bilirubin: 0.4 mg/dL (ref 0.3–1.2)
Total Protein: 6.9 g/dL (ref 6.5–8.1)

## 2017-02-17 LAB — ETHANOL: Alcohol, Ethyl (B): 79 mg/dL — ABNORMAL HIGH (ref <10)

## 2017-02-17 MED ORDER — OXYCODONE-ACETAMINOPHEN 5-325 MG PO TABS: 2.0000 | ORAL_TABLET | ORAL | 0 refills | Status: DC | PRN

## 2017-02-17 MED ORDER — ONDANSETRON HCL 4 MG/2ML IJ SOLN
INTRAMUSCULAR | Status: AC
  Filled 2017-02-17: qty 2

## 2017-02-17 MED ORDER — SODIUM CHLORIDE 0.9 % IV BOLUS (SEPSIS)
1000.0000 mL | Freq: Once | INTRAVENOUS | Status: AC
  Administered 2017-02-17: 1000 mL via INTRAVENOUS

## 2017-02-17 MED ORDER — ONDANSETRON HCL 4 MG/2ML IJ SOLN
4.0000 mg | Freq: Once | INTRAMUSCULAR | Status: AC
Start: 2017-02-17 — End: 2017-02-17
  Administered 2017-02-17: 4 mg via INTRAVENOUS

## 2017-02-17 MED ORDER — OXYCODONE-ACETAMINOPHEN 5-325 MG PO TABS
ORAL_TABLET | ORAL | Status: AC
  Filled 2017-02-17: qty 1

## 2017-02-17 MED ORDER — OXYCODONE-ACETAMINOPHEN 5-325 MG PO TABS
1.0000 | ORAL_TABLET | Freq: Once | ORAL | Status: AC
  Administered 2017-02-17: 1 via ORAL

## 2017-02-17 MED ORDER — OXYCODONE-ACETAMINOPHEN 5-325 MG PO TABS
1.0000 | ORAL_TABLET | Freq: Once | ORAL | Status: AC
  Administered 2017-02-17: 1 via ORAL
  Filled 2017-02-17: qty 1

## 2017-02-17 MED ORDER — ONDANSETRON 4 MG PO TBDP
ORAL_TABLET | ORAL | Status: AC
  Filled 2017-02-17: qty 1

## 2017-02-17 MED ORDER — KETOROLAC TROMETHAMINE 30 MG/ML IJ SOLN
30.0000 mg | Freq: Once | INTRAMUSCULAR | Status: AC
  Administered 2017-02-17: 30 mg via INTRAVENOUS
  Filled 2017-02-17: qty 1

## 2017-02-17 MED ORDER — OXYCODONE-ACETAMINOPHEN 5-325 MG PO TABS
1.0000 | ORAL_TABLET | Freq: Four times a day (QID) | ORAL | Status: DC | PRN
Start: 2017-02-17 — End: 2017-02-18
  Administered 2017-02-17 – 2017-02-18 (×3): 1 via ORAL
  Filled 2017-02-17 (×2): qty 1

## 2017-02-17 NOTE — Discharge Instructions (Addendum)
These follow-up with neurosurgery for further evaluation of your back pain.  Please return with any worsening pain, numbness in your legs or any other conditions.

## 2017-02-17 NOTE — ED Provider Notes (Signed)
[facility] Emergency Department Provider Note   ____________________________________________   First MD Initiated Contact with Patient 02/17/17 0113     (approximate)  I have reviewed the triage vital signs and the nursing notes.   HISTORY  Chief Complaint Fall and Back Pain    HPI [place] H Riche is a 84 y.o. female who comes into the hospital today after a fall.  The patient states that she lost her balance and fell.  She denies being dizzy.  She reports that she has some thoracic and lumbar back pain.  The patient was getting ready to go to bed.  She had 2 glasses of wine tonight.  She reports that when she was getting up she fell in between the coffee table in the couch.  She tried to get up but she could not.  The patient denies any loss of consciousness or any injury to her head.  She is undergoing treatment for UTI at this time.  The patient rates her pain an 8 out of 10 in intensity.  Past Medical History:  Diagnosis Date  . Adnexal mass 06/03/2014   since 2009  . 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 GERD (gastroesophageal reflux disease)   . Herpes zoster without complication 03/05/7122  . HTN (hypertension)   . Non Hodgkin's lymphoma ([place])   . Recurrent UTI     Patient Active Problem List   Diagnosis Date Noted  . Osteoporosis 11/20/2015  . COPD (chronic obstructive pulmonary disease) ([place]) 11/20/2015  . Cyst of ovary 07/26/2015  . Status post total left knee replacement 04/03/2015  . Abnormal chest sounds 08/25/2014  . Allergic rhinitis 08/25/2014  . Colon polyp 08/25/2014  . DDD (degenerative disc disease), cervical 08/25/2014  . DDD (degenerative disc disease), lumbar 08/25/2014  . H/O non-Hodgkin's lymphoma 08/25/2014  . Below normal amount of sodium in the blood 08/25/2014  . Arthritis of knee, degenerative 08/25/2014  . Subclinical  hypothyroidism 08/25/2014  . Adnexal mass 06/03/2014  . 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 History of smoking 03/11/2008    Past Surgical History:  Procedure Laterality Date  . ABDOMINAL SURGERY  2009   abdominal mass+ NH lymphoma,  . CATARACT EXTRACTION  1997   right eye  . cervical neck fusion  1995  . DENTAL SURGERY     screws  . laparotomy with biopsy  03/02/2007  . PORTACATH PLACEMENT  2009  . [place]  . SQUAMOUS CELL CARCINOMA EXCISION     right arm  . TOTAL KNEE ARTHROPLASTY Right   . VAGINAL HYSTERECTOMY  1971    Prior to Admission medications   Medication Sig Start Date End Date Taking? Authorizing Provider  ciprofloxacin (CIPRO) 250 MG tablet Take 1 tablet (250 mg total) by mouth 2 (two) times daily. 02/10/17  Yes Birdie Sons, MD  docusate sodium (COLACE) 100 MG capsule Take 100 mg by mouth 1 day or 1 dose. Reported on 03/27/2015    [provider]  fexofenadine (ALLEGRA) 180 MG tablet Take 1 tablet (180 mg total) by mouth daily. 08/25/14   Carmon Ginsberg, PA  fluticasone (FLONASE) 50 MCG/ACT nasal spray Place 2 sprays into both nostrils daily. 08/25/14   Carmon Ginsberg, PA  oxyCODONE-acetaminophen (PERCOCET/ROXICET) 5-325 MG tablet Take 2 tablets by mouth every 4 (four) hours as needed for severe pain. 02/17/17   Charlesetta Ivory  P, MD  risedronate (ACTONEL) 35 MG tablet Take 1 tablet (35 mg total) by mouth every 7 (seven) days. with water on empty stomach, nothing by mouth or lie down for next 30 minutes. 11/20/15   Birdie Sons, MD    Allergies Milk-related compounds and Sulfa antibiotics  Family History  Problem Relation Age of Onset  . Breast cancer Sister   . Dementia Sister   . Cataracts Sister   . Heart attack Brother   . CAD Brother   . Heart attack Brother   . Leukemia Grandchild        granddaughter  . Kidney disease Neg Hx   . Bladder Cancer Neg Hx     Social History Social History   Tobacco Use  . Smoking  status: Former Smoker    Packs/day: 0.25    Types: Cigarettes    Last attempt to quit: 06/21/1989    Years since quitting: 27.6  . Smokeless tobacco: Former Systems developer    Quit date: 01/28/1989  Substance Use Topics  . Alcohol use: Yes    Alcohol/week: 8.4 oz    Types: 14 Glasses of wine per week  . Drug use: No    Review of Systems  Constitutional: No fever/chills Eyes: No visual changes. ENT: No sore throat. Cardiovascular: Denies chest pain. Respiratory: Denies shortness of breath. Gastrointestinal: No abdominal pain.  No nausea, no vomiting.  No diarrhea.  No constipation. Genitourinary: Negative for dysuria. Musculoskeletal: back pain. Skin: Negative for rash. Neurological: Negative for headaches, focal weakness or numbness.   ____________________________________________   PHYSICAL EXAM:  VITAL SIGNS: ED Triage Vitals  Enc Vitals Group     BP 02/17/17 0116 138/78     Pulse Rate 02/17/17 0116 90     Resp 02/17/17 0116 18     Temp 02/17/17 0115 97.6 F (36.4 C)     Temp Source 02/17/17 0115 Oral     SpO2 02/17/17 0116 94 %     Weight 02/17/17 0115 156 lb (70.8 kg)     Height 02/17/17 0115 5\' 3"  (1.6 m)     Head Circumference --      Peak Flow --      Pain Score 02/17/17 0114 8     Pain Loc --      Pain Edu? --      Excl. in [place]? --     Constitutional: Alert and oriented. Well appearing and in moderate distress. Eyes: Conjunctivae are normal. PERRL. EOMI. Head: Atraumatic. Nose: No congestion/rhinnorhea. Mouth/Throat: Mucous membranes are moist.  Oropharynx non-erythematous. Neck: No cervical motion tenderness to palpation Cardiovascular: Normal rate, regular rhythm. Grossly normal heart sounds.  Good peripheral circulation. Respiratory: Normal respiratory effort.  No retractions. Lungs CTAB. Gastrointestinal: Soft and nontender. No distention.  Positive bowel sounds Musculoskeletal: Tenderness to palpation of lumbar spine midline.  No step-offs no tenderness to  palpation of the hips or pelvis Neurologic:  Normal speech and language.  Skin:  Skin is warm, dry and intact.  Psychiatric: Mood and affect are normal.   ____________________________________________   LABS (all labs ordered are listed, but only abnormal results are displayed)  Labs Reviewed  CBC WITH DIFFERENTIAL/PLATELET - Abnormal; Notable for the following components:      Result Value   WBC 16.3 (*)    Neutro Abs 14.5 (*)    Lymphs Abs 0.8 (*)    All other components within normal limits  COMPREHENSIVE METABOLIC PANEL - Abnormal; Notable for the following components:  Chloride 99 (*)    Glucose, Bld 143 (*)    Calcium 8.8 (*)    All other components within normal limits  URINALYSIS, COMPLETE (UACMP) WITH MICROSCOPIC - Abnormal; Notable for the following components:   Color, Urine YELLOW (*)    APPearance CLEAR (*)    Ketones, ur 5 (*)    Squamous Epithelial / LPF 0-5 (*)    All other components within normal limits  ETHANOL - Abnormal; Notable for the following components:   Alcohol, Ethyl (B) 79 (*)    All other components within normal limits   ____________________________________________  EKG  none ____________________________________________  RADIOLOGY  Dg Thoracic Spine 2 View  Result Date: 02/17/2017 CLINICAL DATA:  Injury with back pain EXAM: THORACIC SPINE 2 VIEWS COMPARISON:  Chest x-ray 07/05/2015 FINDINGS: Long dextroscoliosis of the spine. Partially visualized cervical hardware. Moderate compression fracture at L1. Remaining vertebral body heights are maintained. IMPRESSION: Moderate compression fracture at L1. Electronically Signed   By: Donavan Foil M.D.   On: 02/17/2017 02:04   Dg Lumbar Spine Complete  Result Date: 02/17/2017 CLINICAL DATA:  Low back pain after fall EXAM: LUMBAR SPINE - COMPLETE 4+ VIEW COMPARISON:  None. FINDINGS: Moderate levoscoliosis of the lumbar spine. Suspected mild retrolisthesis of L4 on L5. Age indeterminate moderate  superior endplate deformity at L1. Moderate degenerative changes at L3-L4 and L5-S1. Aortic atherosclerosis IMPRESSION: 1. Age indeterminate moderate compression fracture at L1 involving the superior endplate 2. Scoliosis and degenerative changes of the spine Electronically Signed   By: Donavan Foil M.D.   On: 02/17/2017 02:02   Dg Pelvis 1-2 Views  Result Date: 02/17/2017 CLINICAL DATA:  Fall with pain EXAM: PELVIS - 1-2 VIEW COMPARISON:  None. FINDINGS: SI joints are symmetric. Pubic symphysis and rami are intact. No acute fracture or dislocation. IMPRESSION: No acute osseous abnormality Electronically Signed   By: Donavan Foil M.D.   On: 02/17/2017 02:05   Ct Lumbar Spine Wo Contrast  Result Date: 02/17/2017 CLINICAL DATA:  Fall with low back pain EXAM: CT LUMBAR SPINE WITHOUT CONTRAST TECHNIQUE: Multidetector CT imaging of the lumbar spine was performed without intravenous contrast administration. Multiplanar CT image reconstructions were also generated. COMPARISON:  None. FINDINGS: Segmentation: 5 lumbar type vertebrae. Alignment: Left convex scoliosis with apex at L2-L3. Vertebrae: There is an incomplete burst fracture of L1 with approximately 25% height loss there is minimal retropulsion of the posterosuperior corner, measuring 2 mm. The fracture involves the superior endplate and the anterior and posterior walls and, therefore, the anterior and middle columns according to the 3 column model. The fracture does not extend to the posterior elements. No other fracture. Paraspinal and other soft tissues: Diffuse aortic atherosclerotic calcification. Visualized retroperitoneal and paraspinous soft tissues are otherwise unremarkable. Disc levels: There is moderate-to-severe spinal canal stenosis and right neural foraminal stenosis at L3-L4 due to disc osteophyte complex and facet hypertrophy. Moderate canal stenosis and moderate left foraminal stenosis at L4-L5. Multilevel lumbar facet arthrosis.  IMPRESSION: 1. Acute incomplete burst type fracture L1 with 25% height loss and 2 mm retropulsion without associated spinal canal stenosis. Fracture involves the anterior and middle columns, as delineated by the 3 column model. 2. Moderate-to-severe spinal canal stenosis at L3-L4 and L4-L5 with moderate-to-severe right L3-4 and left L4-5 foraminal stenosis. Electronically Signed   By: Ulyses Jarred M.D.   On: 02/17/2017 05:40    ____________________________________________   PROCEDURES  Procedure(s) performed: None  Procedures  Critical Care performed: No  ____________________________________________  INITIAL IMPRESSION / ASSESSMENT AND PLAN / ED COURSE  As part of my medical decision making, I reviewed the following data within the electronic MEDICAL RECORD NUMBER Notes from prior ED visits and [place] Controlled Substance Database   This is an 84 year old female who comes into the hospital today after a fall with some back pain.  I will send the patient for an x-ray of her back and she will receive some Percocet.  She will be reassessed.  We will also check some blood work since the patient is undergoing treatment for UTI.  Clinical Course as of Feb 18 715  Mon Feb 17, 2017  0154 The patient refused her CT of her head and cervical spine as she reports she did not hit her head and her neck feels fine.  [AW]  0622 I discussed the patient CT results with Dr. Cari Caraway the neurosurgeon.  He recommends that the patient be fitted for a TLSO brace and she can follow-up in his clinic this week.  I will give the patient some more pain medicine.  [AW]    Clinical Course User Index [AW] Loney Hering, MD    The patient will receive a TLSO brace and we will have her ambulate and she will be dispositioned to follow-up with Dr. Cari Caraway in the clinic. ____________________________________________   FINAL CLINICAL IMPRESSION(S) / ED DIAGNOSES  Final diagnoses:  Closed burst fracture of  lumbar vertebra, initial encounter [facility])  Fall, initial encounter  Acute midline low back pain without sciatica     ED Discharge Orders        Ordered    oxyCODONE-acetaminophen (PERCOCET/ROXICET) 5-325 MG tablet  Every 4 hours PRN     02/17/17 0717       Note:  This document was prepared using Dragon voice recognition software and may include unintentional dictation errors.   Loney Hering, MD 02/17/17 [phone]

## 2017-02-17 NOTE — ED Notes (Signed)
Pt refused lunch tray and requested peanut butter instead - peanut butter given to pt and daughter assisting her in eating

## 2017-02-17 NOTE — Clinical Social Work Note (Addendum)
CSW consulted for "Pt. Monica Singh and has back fracture; TLSO brace ordered; skilled nursing facility placement." CSW met with pt and daughter-[name] Wilburn at bedside, along with CM Malachy Mood. CSW introduced self. Pt from home (condo) and lives by herself. Pt has a brace on and is unable to care for herself. Pt is agreeable to short term rehab and states she has previously been at [facility] for rehab.   CSW completed FL-2 and faxed out via the [place]. PASRR # already exists. CSW received a call from Sharyn Lull [phone]) in admissions at [facility], who made a bed offer for pt. Pt will need pre-authorization for rehab.  CSW spoke with P/T Supervisor Cyril Mourning who stated P/T evaluation to be completed this afternoon. CSW needs/awaiting physical therapy evaluation in order to submit to insurance for authorization, as well as FL-2 to be signed.   CSW initiated [place] Chief Financial Officer [phone]) with Vee at [employer] [phone]). Awaiting confirmation back. Pt likely to discharge on Tuesday 1/22. Pt and dtr updated. RN and EDP aware.  6:22pm -  CSW received insurance auth approval # [id number]. CSW continuing to follow for discharge needs.   Oretha Ellis, Latanya Presser, [place] Social Worker-ED [phone]

## 2017-02-17 NOTE — ED Notes (Signed)
Pt with hypotension. md notified. Pt also complains of increased nausea. Orders for ns bolus and zofran received. Pt with pwd skin. Pt with 3+ radial pulse noted.

## 2017-02-17 NOTE — ED Notes (Signed)
Pt given supper tray and juice - daughters assisting her to eat

## 2017-02-17 NOTE — ED Notes (Signed)
Pt able to roll with assistance and was able to stand and walk 2 steps, pt reported she was starting to feel hot, RN and Pam ED Tech assisted pt back on bed.

## 2017-02-17 NOTE — ED Provider Notes (Signed)
The patient attempted to ambulate with assistance and could only take 2 steps before nearly falling secondary to pain.  She lives alone in a condo and at this point I am not comfortable having her go home.  Physical therapy and social work consultations have been placed for assistance with acute rehabilitation.   Darel Hong, MD 02/17/17 [phone]

## 2017-02-17 NOTE — ED Notes (Signed)
Called [phone] for TLSO brace.

## 2017-02-17 NOTE — ED Notes (Signed)
Pt and family updated on results of ct scan and page out to neurosurgeon.

## 2017-02-17 NOTE — ED Notes (Signed)
Pt in and out cathed for urine sample.  

## 2017-02-17 NOTE — ED Notes (Signed)
Report to sylvia, rn.  

## 2017-02-17 NOTE — ED Notes (Signed)
Mike with brace TLSO brace at bedside to put TLSO brace to pt.

## 2017-02-17 NOTE — ED Notes (Signed)
Pt's oxygen saturation 92%RA, with exertion oxygen saturation dropped to 86-88%RA

## 2017-02-17 NOTE — ED Notes (Signed)
Call from Dr. Izora Ribas [facility] Neurosurgery)

## 2017-02-17 NOTE — ED Triage Notes (Signed)
Pt with fall from home, no loc. Pt states she "just lost my balance". Pt complains of low and mid back pain. Pt with emesis at home per ems. Pt states she "vomits from pain".

## 2017-02-17 NOTE — NC FL2 (Signed)
[place] LEVEL OF CARE SCREENING TOOL     IDENTIFICATION  Patient Name: Monica Singh Birthdate: Jun 21, 1928 Sex: female Admission Date (Current Location): 02/17/2017  [place] and [place] Number:  Engineering geologist and Address:  [facility], [address] [place], [place] [address]      Provider Number: [id number]  Attending Physician Name and Address:  No att. providers found  Relative Name and Phone Number:  Daughter-[name] Darius Bump [phone]    Current Level of Care: Hospital Recommended Level of Care: [place] Prior Approval Number:    Date Approved/Denied:   PASRR Number: [id number]  Discharge Plan: SNF    Current Diagnoses: Patient Active Problem List   Diagnosis Date Noted  . Osteoporosis 11/20/2015  . COPD (chronic obstructive pulmonary disease) ([place]) 11/20/2015  . Cyst of ovary 07/26/2015  . Status post total left knee replacement 04/03/2015  . Abnormal chest sounds 08/25/2014  . Allergic rhinitis 08/25/2014  . Colon polyp 08/25/2014  . DDD (degenerative disc disease), cervical 08/25/2014  . DDD (degenerative disc disease), lumbar 08/25/2014  . H/O non-Hodgkin's lymphoma 08/25/2014  . Below normal amount of sodium in the blood 08/25/2014  . Arthritis of knee, degenerative 08/25/2014  . Subclinical hypothyroidism 08/25/2014  . Adnexal mass 06/03/2014  . 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 History of smoking 03/11/2008    Orientation RESPIRATION BLADDER Height & Weight     Self, Time, Situation, Place  O2(2L [place]) Continent Weight: 156 lb (70.8 kg) Height:  5\' 3"  (160 cm)  BEHAVIORAL SYMPTOMS/MOOD NEUROLOGICAL BOWEL NUTRITION STATUS      Continent Diet(Normal diet)  AMBULATORY STATUS COMMUNICATION OF NEEDS Skin   Extensive Assist Verbally Normal                       Personal Care Assistance Level of Assistance  Bathing, Feeding, Dressing Bathing Assistance: Maximum  assistance Feeding assistance: Limited assistance Dressing Assistance: Maximum assistance     Functional Limitations Info  Sight, Hearing, Speech Sight Info: Adequate Hearing Info: Adequate Speech Info: Adequate    SPECIAL CARE FACTORS FREQUENCY  PT (By licensed PT)     PT Frequency: 5x              Contractures Contractures Info: Not present    Additional Factors Info  Code Status, Allergies Code Status Info: Full Allergies Info: Milk-related Compounds, Sulfa Antibiotics           Current Medications (02/17/2017):  This is the current hospital active medication list Current Facility-Administered Medications  Medication Dose Route Frequency Provider Last Rate Last Dose  . ondansetron (ZOFRAN-ODT) 4 MG disintegrating tablet            Current Outpatient Medications  Medication Sig Dispense Refill  . ciprofloxacin (CIPRO) 250 MG tablet Take 1 tablet (250 mg total) by mouth 2 (two) times daily. 14 tablet 0  . fexofenadine (ALLEGRA) 180 MG tablet Take 1 tablet (180 mg total) by mouth daily. (Patient not taking: Reported on 02/17/2017) 30 tablet 2  . fluticasone (FLONASE) 50 MCG/ACT nasal spray Place 2 sprays into both nostrils daily. (Patient not taking: Reported on 02/17/2017) 16 g 6  . oxyCODONE-acetaminophen (PERCOCET/ROXICET) 5-325 MG tablet Take 2 tablets by mouth every 4 (four) hours as needed for severe pain. 12 tablet 0  . PROAIR HFA 108 (90 Base) MCG/ACT inhaler Inhale 2 puffs into the lungs every 6 (six) hours as needed.    Marland Kitchen  risedronate (ACTONEL) 35 MG tablet Take 1 tablet (35 mg total) by mouth every 7 (seven) days. with water on empty stomach, nothing by mouth or lie down for next 30 minutes. (Patient not taking: Reported on 02/17/2017) 4 tablet 12     Discharge Medications: Please see discharge summary for a list of discharge medications.  Relevant Imaging Results:  Relevant Lab Results:   Additional Information SSN: [id number]  Truitt Merle,  LCSW

## 2017-02-17 NOTE — ED Notes (Signed)
PT at bedside.

## 2017-02-17 NOTE — ED Notes (Signed)
Pt resting family attentive to pt at bedside, SW already seen pt denies any needs at present denies any pain

## 2017-02-17 NOTE — Evaluation (Signed)
Physical Therapy Evaluation Patient Details Name: Monica Singh MRN: [id number] DOB: 07-Oct-1928 Today's Date: 02/17/2017   History of Present Illness  presented to ER secondary to fall in home environment, sustaining L1 burst fracture with 44mm retropulsion, no central canal stenosis.  Orders noted for TLSO at all times; donned throughout session  Clinical Impression  Upon evaluation, patient alert and oriented; follows all commands and demonstrates good insight/safety awareness.  Bilat UE/LE strength and ROM grossly symmetrical and WFL; no focal weakness, paresthesia, radicular symptoms or bowel/bladder problems reported.  Rates pain 5/10 at rest, 10/10 with any mobility efforts.  Verbally acknowledges back precautions, though requires max cuing/assist to integrate with functional activities.  Currently requiring mod assist for log rolling, max/total assist for partial transition to unsupported sitting. Unable to tolerate transition to fully upright position (due to pain), requiring return to supine for pain relief.  Unsafe/unable to attempt OOB or gait at this time.  Will continue assessment/progression as appropriate. As patient lives alone, unsafe/unable to return home alone.  Unable to complete ADLs or any form of mobility without extensive assist. Would benefit from skilled PT to address above deficits and promote optimal return to PLOF; recommend transition to STR upon discharge from acute hospitalization.     Follow Up Recommendations SNF    Equipment Recommendations  Rolling walker with 5" wheels    Recommendations for Other Services       Precautions / Restrictions Precautions Precautions: Fall;Back Required Braces or Orthoses: Spinal Brace Spinal Brace: Thoracolumbosacral orthotic Restrictions Weight Bearing Restrictions: No      Mobility  Bed Mobility Overal bed mobility: Needs Assistance Bed Mobility: Sidelying to Sit;Rolling Rolling: Min assist;Mod  assist Sidelying to sit: Max assist;Total assist       General bed mobility comments: unable to tolerate transition to fully upright position due to severe pain, requiring return to supine for pain control  Transfers                 General transfer comment: unable to tolerate due to pain  Ambulation/Gait             General Gait Details: unable to tolerate due to pain  Stairs            Wheelchair Mobility    Modified Rankin (Stroke Patients Only)       Balance Overall balance assessment: Needs assistance Sitting-balance support: No upper extremity supported;Feet supported Sitting balance-Leahy Scale: Poor         Standing balance comment: unable to tolerate due to pain                             Pertinent Vitals/Pain Pain Assessment: Faces Faces Pain Scale: Hurts worst Pain Location: back Pain Descriptors / Indicators: Aching;Grimacing;Guarding Pain Intervention(s): Limited activity within patient's tolerance;Monitored during session;Repositioned    Home Living Family/patient expects to be discharged to:: Private residence Living Arrangements: Alone Available Help at Discharge: Family;Available PRN/intermittently Type of Home: (condo)       Home Layout: One level Home Equipment: None      Prior Function Level of Independence: Independent         Comments: Indep with ADLs, household and community mobilization; + driving; does endorse 2-3 previous falls within 6 months     Hand Dominance        Extremity/Trunk Assessment   Upper Extremity Assessment Upper Extremity Assessment: Overall WFL for tasks assessed(grossly at least 4+/5  throughout)    Lower Extremity Assessment Lower Extremity Assessment: Overall WFL for tasks assessed(grossly at least 4-/5 throughout; denies radicular symptoms or paresthesias, no changes in bowel/bladder)       Communication   Communication: No difficulties  Cognition  Arousal/Alertness: Awake/alert Behavior During Therapy: WFL for tasks assessed/performed Overall Cognitive Status: Within Functional Limits for tasks assessed                                        General Comments      Exercises Other Exercises Other Exercises: Reviewed back precautions and safety needs related to new fracture; will reinforce throughout rehab course as needed.   Assessment/Plan    PT Assessment Patient needs continued PT services  PT Problem List Decreased strength;Decreased range of motion;Decreased activity tolerance;Decreased balance;Decreased mobility;Decreased coordination;Decreased cognition;Decreased knowledge of use of DME;Decreased safety awareness;Pain       PT Treatment Interventions DME instruction;Gait training;Functional mobility training;Therapeutic activities;Therapeutic exercise;Balance training;Patient/family education    PT Goals (Current goals can be found in the Care Plan section)  Acute Rehab PT Goals Patient Stated Goal: to make the pain better PT Goal Formulation: With patient/family Time For Goal Achievement: 03/03/17 Potential to Achieve Goals: Good    Frequency 7X/week   Barriers to discharge Decreased caregiver support      Co-evaluation               AM-PAC PT "6 Clicks" Daily Activity  Outcome Measure Difficulty turning over in bed (including adjusting bedclothes, sheets and blankets)?: Unable Difficulty moving from lying on back to sitting on the side of the bed? : Unable Difficulty sitting down on and standing up from a chair with arms (e.g., wheelchair, bedside commode, etc,.)?: Unable Help needed moving to and from a bed to chair (including a wheelchair)?: Total Help needed walking in hospital room?: Total Help needed climbing 3-5 steps with a railing? : Total 6 Click Score: 6    End of Session Equipment Utilized During Treatment: Gait belt;Back brace Activity Tolerance: Patient limited by  pain Patient left: in bed;with call bell/phone within reach;with family/visitor present Nurse Communication: Mobility status PT Visit Diagnosis: Difficulty in walking, not elsewhere classified (R26.2);Repeated falls (R29.6)    Time: 7001-7494 PT Time Calculation (min) (ACUTE ONLY): 16 min   Charges:   PT Evaluation $PT Eval Low Complexity: 1 Low PT Treatments $Therapeutic Activity: 8-22 mins   PT G Codes:        Nakari Bracknell H. Owens Shark, PT, DPT, NCS 02/17/17, 12:32 PM [phone]

## 2017-02-17 NOTE — ED Notes (Signed)
Pt placed on oxygen at 2lpm via [place] for ra pox while sleeping of 90%.

## 2017-02-18 DIAGNOSIS — M48061 Spinal stenosis, lumbar region without neurogenic claudication: Secondary | ICD-10-CM | POA: Diagnosis not present

## 2017-02-18 DIAGNOSIS — E039 Hypothyroidism, unspecified: Secondary | ICD-10-CM | POA: Diagnosis not present

## 2017-02-18 DIAGNOSIS — M8000XD Age-related osteoporosis with current pathological fracture, unspecified site, subsequent encounter for fracture with routine healing: Secondary | ICD-10-CM | POA: Insufficient documentation

## 2017-02-18 DIAGNOSIS — Z9981 Dependence on supplemental oxygen: Secondary | ICD-10-CM | POA: Diagnosis not present

## 2017-02-18 DIAGNOSIS — Z79899 Other long term (current) drug therapy: Secondary | ICD-10-CM | POA: Diagnosis not present

## 2017-02-18 DIAGNOSIS — S32001D Stable burst fracture of unspecified lumbar vertebra, subsequent encounter for fracture with routine healing: Secondary | ICD-10-CM | POA: Diagnosis not present

## 2017-02-18 DIAGNOSIS — S32001A Stable burst fracture of unspecified lumbar vertebra, initial encounter for closed fracture: Secondary | ICD-10-CM | POA: Diagnosis not present

## 2017-02-18 DIAGNOSIS — K219 Gastro-esophageal reflux disease without esophagitis: Secondary | ICD-10-CM | POA: Diagnosis not present

## 2017-02-18 DIAGNOSIS — Z01810 Encounter for preprocedural cardiovascular examination: Secondary | ICD-10-CM | POA: Diagnosis not present

## 2017-02-18 DIAGNOSIS — C8590 Non-Hodgkin lymphoma, unspecified, unspecified site: Secondary | ICD-10-CM | POA: Diagnosis not present

## 2017-02-18 DIAGNOSIS — M47896 Other spondylosis, lumbar region: Secondary | ICD-10-CM | POA: Diagnosis not present

## 2017-02-18 DIAGNOSIS — W19XXXA Unspecified fall, initial encounter: Secondary | ICD-10-CM | POA: Diagnosis not present

## 2017-02-18 DIAGNOSIS — S32010A Wedge compression fracture of first lumbar vertebra, initial encounter for closed fracture: Secondary | ICD-10-CM | POA: Diagnosis not present

## 2017-02-18 DIAGNOSIS — Y929 Unspecified place or not applicable: Secondary | ICD-10-CM | POA: Diagnosis not present

## 2017-02-18 DIAGNOSIS — J42 Unspecified chronic bronchitis: Secondary | ICD-10-CM | POA: Insufficient documentation

## 2017-02-18 DIAGNOSIS — N390 Urinary tract infection, site not specified: Secondary | ICD-10-CM | POA: Diagnosis not present

## 2017-02-18 DIAGNOSIS — K5900 Constipation, unspecified: Secondary | ICD-10-CM | POA: Diagnosis not present

## 2017-02-18 DIAGNOSIS — I10 Essential (primary) hypertension: Secondary | ICD-10-CM | POA: Diagnosis not present

## 2017-02-18 DIAGNOSIS — M549 Dorsalgia, unspecified: Secondary | ICD-10-CM | POA: Diagnosis not present

## 2017-02-18 DIAGNOSIS — R935 Abnormal findings on diagnostic imaging of other abdominal regions, including retroperitoneum: Secondary | ICD-10-CM | POA: Diagnosis not present

## 2017-02-18 DIAGNOSIS — X58XXXA Exposure to other specified factors, initial encounter: Secondary | ICD-10-CM | POA: Diagnosis not present

## 2017-02-18 DIAGNOSIS — M4856XA Collapsed vertebra, not elsewhere classified, lumbar region, initial encounter for fracture: Secondary | ICD-10-CM | POA: Diagnosis not present

## 2017-02-18 DIAGNOSIS — Z8572 Personal history of non-Hodgkin lymphomas: Secondary | ICD-10-CM | POA: Diagnosis not present

## 2017-02-18 DIAGNOSIS — Z96651 Presence of right artificial knee joint: Secondary | ICD-10-CM | POA: Diagnosis not present

## 2017-02-18 DIAGNOSIS — J449 Chronic obstructive pulmonary disease, unspecified: Secondary | ICD-10-CM | POA: Diagnosis not present

## 2017-02-18 DIAGNOSIS — J309 Allergic rhinitis, unspecified: Secondary | ICD-10-CM | POA: Diagnosis not present

## 2017-02-18 DIAGNOSIS — Z7401 Bed confinement status: Secondary | ICD-10-CM | POA: Diagnosis not present

## 2017-02-18 DIAGNOSIS — Z85828 Personal history of other malignant neoplasm of skin: Secondary | ICD-10-CM | POA: Diagnosis not present

## 2017-02-18 DIAGNOSIS — M545 Low back pain: Secondary | ICD-10-CM | POA: Diagnosis not present

## 2017-02-18 DIAGNOSIS — Z7982 Long term (current) use of aspirin: Secondary | ICD-10-CM | POA: Diagnosis not present

## 2017-02-18 DIAGNOSIS — Z9181 History of falling: Secondary | ICD-10-CM | POA: Diagnosis not present

## 2017-02-18 DIAGNOSIS — Z87891 Personal history of nicotine dependence: Secondary | ICD-10-CM | POA: Diagnosis not present

## 2017-02-18 DIAGNOSIS — M6281 Muscle weakness (generalized): Secondary | ICD-10-CM | POA: Diagnosis not present

## 2017-02-18 DIAGNOSIS — M8008XD Age-related osteoporosis with current pathological fracture, vertebra(e), subsequent encounter for fracture with routine healing: Secondary | ICD-10-CM | POA: Diagnosis not present

## 2017-02-18 DIAGNOSIS — S32018A Other fracture of first lumbar vertebra, initial encounter for closed fracture: Secondary | ICD-10-CM | POA: Diagnosis not present

## 2017-02-18 DIAGNOSIS — W010XXA Fall on same level from slipping, tripping and stumbling without subsequent striking against object, initial encounter: Secondary | ICD-10-CM | POA: Diagnosis not present

## 2017-02-18 DIAGNOSIS — R262 Difficulty in walking, not elsewhere classified: Secondary | ICD-10-CM | POA: Diagnosis not present

## 2017-02-18 MED ORDER — OXYCODONE HCL 5 MG PO TABS: 5.0000 mg | ORAL_TABLET | Freq: Four times a day (QID) | ORAL | 0 refills | Status: DC | PRN

## 2017-02-18 NOTE — ED Notes (Signed)
Pt given meal tray.

## 2017-02-18 NOTE — ED Notes (Signed)
Attempted to call report to [facility], no answer, no machine

## 2017-02-18 NOTE — Clinical Social Work Note (Signed)
Patient to discharge to [facility] today. Sharyn Lull at [facility] is aware and has confirmed the [place] by the insurance. CSW has given patient's nurse, Levada Dy, the number for room and report at [facility]. Patient's nurse states she will need to transport via EMS. Patient's daughter aware of discharge to [facility] today as she is over at [facility] completing admission paperwork. Shela Leff MSW,LCSW [phone]

## 2017-02-18 NOTE — ED Notes (Signed)
Attempted to call report to [facility], line busy

## 2017-02-18 NOTE — ED Provider Notes (Signed)
Patient remained medically stable, symptoms controlled. Accepted for [place], and discharge. Discharge instructions and oxycodone prescription for pain control given to patient's family due to EMS transporting patient prior to discharge process been completed in the emergency department..  Final diagnoses:  Closed burst fracture of lumbar vertebra, initial encounter ([place])  Fall, initial encounter  Acute midline low back pain without sciatica      Carrie Mew, MD 02/18/17 1334

## 2017-02-18 NOTE — ED Notes (Addendum)
Attempted to call report to [facility], no answer, no machine. Will continue to try to c all.   [phone]. Pt going to room 207-B

## 2017-02-19 ENCOUNTER — Other Ambulatory Visit: Payer: Self-pay

## 2017-02-19 MED ORDER — OXYCODONE-ACETAMINOPHEN 5-325 MG PO TABS: 2.0000 | ORAL_TABLET | ORAL | 0 refills | Status: DC | PRN

## 2017-02-19 NOTE — Telephone Encounter (Signed)
Rx sent to [facility] phone : [phone] , fax : [phone]  

## 2017-02-20 ENCOUNTER — Ambulatory Visit
Admission: RE | Admit: 2017-02-20 | Discharge: 2017-02-20 | Disposition: A | Discharge status: Home / Self Care | Payer: Medicare Other | Source: Ambulatory Visit | Attending: Orthopedic Surgery | Admitting: Orthopedic Surgery

## 2017-02-20 ENCOUNTER — Other Ambulatory Visit: Payer: Self-pay | Admitting: Orthopedic Surgery

## 2017-02-20 DIAGNOSIS — S32010A Wedge compression fracture of first lumbar vertebra, initial encounter for closed fracture: Secondary | ICD-10-CM

## 2017-02-20 DIAGNOSIS — M47896 Other spondylosis, lumbar region: Secondary | ICD-10-CM | POA: Diagnosis not present

## 2017-02-20 DIAGNOSIS — M48061 Spinal stenosis, lumbar region without neurogenic claudication: Secondary | ICD-10-CM | POA: Insufficient documentation

## 2017-02-20 DIAGNOSIS — W19XXXA Unspecified fall, initial encounter: Secondary | ICD-10-CM | POA: Diagnosis not present

## 2017-02-20 DIAGNOSIS — X58XXXA Exposure to other specified factors, initial encounter: Secondary | ICD-10-CM | POA: Insufficient documentation

## 2017-02-20 DIAGNOSIS — R935 Abnormal findings on diagnostic imaging of other abdominal regions, including retroperitoneum: Secondary | ICD-10-CM | POA: Insufficient documentation

## 2017-02-20 DIAGNOSIS — S32018A Other fracture of first lumbar vertebra, initial encounter for closed fracture: Secondary | ICD-10-CM | POA: Diagnosis not present

## 2017-02-21 ENCOUNTER — Ambulatory Visit: Payer: Medicare Other | Admitting: Anesthesiology

## 2017-02-21 ENCOUNTER — Other Ambulatory Visit: Payer: Self-pay

## 2017-02-21 ENCOUNTER — Ambulatory Visit: Payer: Medicare Other

## 2017-02-21 ENCOUNTER — Encounter
Admission: RE | Disposition: A | Discharge status: Rehabilitation Facility | Payer: Self-pay | Source: Ambulatory Visit | Attending: Orthopedic Surgery

## 2017-02-21 ENCOUNTER — Ambulatory Visit
Admission: RE | Admit: 2017-02-21 | Discharge: 2017-02-21 | Disposition: A | Discharge status: Rehabilitation Facility | Payer: Medicare Other | Source: Ambulatory Visit | Attending: Orthopedic Surgery | Admitting: Orthopedic Surgery

## 2017-02-21 DIAGNOSIS — Z7982 Long term (current) use of aspirin: Secondary | ICD-10-CM | POA: Diagnosis not present

## 2017-02-21 DIAGNOSIS — E039 Hypothyroidism, unspecified: Secondary | ICD-10-CM | POA: Insufficient documentation

## 2017-02-21 DIAGNOSIS — K219 Gastro-esophageal reflux disease without esophagitis: Secondary | ICD-10-CM | POA: Insufficient documentation

## 2017-02-21 DIAGNOSIS — Z87891 Personal history of nicotine dependence: Secondary | ICD-10-CM | POA: Diagnosis not present

## 2017-02-21 DIAGNOSIS — Z79899 Other long term (current) drug therapy: Secondary | ICD-10-CM | POA: Diagnosis not present

## 2017-02-21 DIAGNOSIS — Y929 Unspecified place or not applicable: Secondary | ICD-10-CM | POA: Insufficient documentation

## 2017-02-21 DIAGNOSIS — W010XXA Fall on same level from slipping, tripping and stumbling without subsequent striking against object, initial encounter: Secondary | ICD-10-CM | POA: Insufficient documentation

## 2017-02-21 DIAGNOSIS — S32010A Wedge compression fracture of first lumbar vertebra, initial encounter for closed fracture: Secondary | ICD-10-CM | POA: Diagnosis not present

## 2017-02-21 DIAGNOSIS — I10 Essential (primary) hypertension: Secondary | ICD-10-CM | POA: Diagnosis not present

## 2017-02-21 DIAGNOSIS — Z419 Encounter for procedure for purposes other than remedying health state, unspecified: Secondary | ICD-10-CM

## 2017-02-21 DIAGNOSIS — J449 Chronic obstructive pulmonary disease, unspecified: Secondary | ICD-10-CM | POA: Diagnosis not present

## 2017-02-21 DIAGNOSIS — Z01810 Encounter for preprocedural cardiovascular examination: Secondary | ICD-10-CM | POA: Diagnosis not present

## 2017-02-21 HISTORY — PX: KYPHOPLASTY: SHX5884

## 2017-02-21 SURGERY — KYPHOPLASTY
Anesthesia: Monitor Anesthesia Care | Site: Spine Lumbar | Wound class: Clean

## 2017-02-21 MED ORDER — LIDOCAINE HCL (PF) 2 % IJ SOLN
INTRAMUSCULAR | Status: AC
  Filled 2017-02-21: qty 10

## 2017-02-21 MED ORDER — LIDOCAINE HCL (CARDIAC) 20 MG/ML IV SOLN
INTRAVENOUS | Status: DC | PRN
  Administered 2017-02-21: 60 mg via INTRAVENOUS

## 2017-02-21 MED ORDER — CEFAZOLIN SODIUM-DEXTROSE 2-4 GM/100ML-% IV SOLN
2.0000 g | Freq: Once | INTRAVENOUS | Status: AC
  Administered 2017-02-21: 2 g via INTRAVENOUS

## 2017-02-21 MED ORDER — FAMOTIDINE 20 MG PO TABS
20.0000 mg | ORAL_TABLET | Freq: Once | ORAL | Status: AC
  Administered 2017-02-21: 20 mg via ORAL

## 2017-02-21 MED ORDER — SODIUM CHLORIDE 0.9 % IV SOLN: INTRAVENOUS | Status: DC

## 2017-02-21 MED ORDER — IOPAMIDOL (ISOVUE-M 200) INJECTION 41%
INTRAMUSCULAR | Status: AC
  Filled 2017-02-21: qty 20

## 2017-02-21 MED ORDER — ONDANSETRON HCL 4 MG/2ML IJ SOLN: 4.0000 mg | Freq: Once | INTRAMUSCULAR | Status: DC | PRN

## 2017-02-21 MED ORDER — KETAMINE HCL 50 MG/ML IJ SOLN
INTRAMUSCULAR | Status: DC | PRN
  Administered 2017-02-21: 5 mg via INTRAMUSCULAR
  Administered 2017-02-21: 10 mg via INTRAMUSCULAR
  Administered 2017-02-21: 5 mg via INTRAMUSCULAR

## 2017-02-21 MED ORDER — MIDAZOLAM HCL 2 MG/2ML IJ SOLN
INTRAMUSCULAR | Status: DC | PRN
  Administered 2017-02-21 (×2): 0.5 mg via INTRAVENOUS
  Administered 2017-02-21: 1 mg via INTRAVENOUS

## 2017-02-21 MED ORDER — ONDANSETRON HCL 4 MG PO TABS: 4.0000 mg | ORAL_TABLET | Freq: Four times a day (QID) | ORAL | Status: DC | PRN

## 2017-02-21 MED ORDER — KETAMINE HCL 50 MG/ML IJ SOLN
INTRAMUSCULAR | Status: AC
  Filled 2017-02-21: qty 10

## 2017-02-21 MED ORDER — FAMOTIDINE 20 MG PO TABS
ORAL_TABLET | ORAL | Status: AC
  Filled 2017-02-21: qty 1

## 2017-02-21 MED ORDER — METOCLOPRAMIDE HCL 5 MG/ML IJ SOLN: 5.0000 mg | Freq: Three times a day (TID) | INTRAMUSCULAR | Status: DC | PRN

## 2017-02-21 MED ORDER — LIDOCAINE HCL (PF) 1 % IJ SOLN
INTRAMUSCULAR | Status: AC
  Filled 2017-02-21: qty 60

## 2017-02-21 MED ORDER — LIDOCAINE HCL 1 % IJ SOLN
INTRAMUSCULAR | Status: DC | PRN
  Administered 2017-02-21: 20 mL

## 2017-02-21 MED ORDER — BUPIVACAINE-EPINEPHRINE (PF) 0.5% -1:200000 IJ SOLN
INTRAMUSCULAR | Status: AC
  Filled 2017-02-21: qty 30

## 2017-02-21 MED ORDER — FENTANYL CITRATE (PF) 100 MCG/2ML IJ SOLN: 25.0000 ug | INTRAMUSCULAR | Status: DC | PRN

## 2017-02-21 MED ORDER — CEFAZOLIN SODIUM-DEXTROSE 2-4 GM/100ML-% IV SOLN
INTRAVENOUS | Status: AC
  Filled 2017-02-21: qty 100

## 2017-02-21 MED ORDER — MIDAZOLAM HCL 2 MG/2ML IJ SOLN
INTRAMUSCULAR | Status: AC
  Filled 2017-02-21: qty 2

## 2017-02-21 MED ORDER — METOCLOPRAMIDE HCL 10 MG PO TABS: 5.0000 mg | ORAL_TABLET | Freq: Three times a day (TID) | ORAL | Status: DC | PRN

## 2017-02-21 MED ORDER — PROPOFOL 500 MG/50ML IV EMUL
INTRAVENOUS | Status: DC | PRN
  Administered 2017-02-21: 50 ug/kg/min via INTRAVENOUS

## 2017-02-21 MED ORDER — BUPIVACAINE-EPINEPHRINE (PF) 0.5% -1:200000 IJ SOLN
INTRAMUSCULAR | Status: DC | PRN
  Administered 2017-02-21: 30 mL

## 2017-02-21 MED ORDER — HYDROCODONE-ACETAMINOPHEN 5-325 MG PO TABS: 1.0000 | ORAL_TABLET | ORAL | Status: DC | PRN

## 2017-02-21 MED ORDER — ONDANSETRON HCL 4 MG/2ML IJ SOLN: 4.0000 mg | Freq: Four times a day (QID) | INTRAMUSCULAR | Status: DC | PRN

## 2017-02-21 MED ORDER — LACTATED RINGERS IV SOLN
INTRAVENOUS | Status: DC
  Administered 2017-02-21 (×2): via INTRAVENOUS

## 2017-02-21 MED ORDER — PROPOFOL 500 MG/50ML IV EMUL
INTRAVENOUS | Status: AC
  Filled 2017-02-21: qty 50

## 2017-02-21 SURGICAL SUPPLY — 16 items
CEMENT KYPHON CX01A KIT/MIXER (Cement) ×3 IMPLANT
DERMABOND ADVANCED (GAUZE/BANDAGES/DRESSINGS) ×2
DERMABOND ADVANCED .7 DNX12 (GAUZE/BANDAGES/DRESSINGS) ×1 IMPLANT
DEVICE BIOPSY BONE KYPHX (INSTRUMENTS) ×3 IMPLANT
DRAPE C-ARM XRAY 36X54 (DRAPES) ×3 IMPLANT
DURAPREP 26ML APPLICATOR (WOUND CARE) ×3 IMPLANT
GLOVE SURG SYN 9.0  PF PI (GLOVE) ×2
GLOVE SURG SYN 9.0 PF PI (GLOVE) ×1 IMPLANT
GOWN SRG 2XL LVL 4 RGLN SLV (GOWNS) ×1 IMPLANT
GOWN STRL NON-REIN 2XL LVL4 (GOWNS) ×2
GOWN STRL REUS W/ TWL LRG LVL3 (GOWN DISPOSABLE) ×1 IMPLANT
GOWN STRL REUS W/TWL LRG LVL3 (GOWN DISPOSABLE) ×2
PACK KYPHOPLASTY (MISCELLANEOUS) ×3 IMPLANT
STRAP SAFETY BODY (MISCELLANEOUS) ×3 IMPLANT
TRAY KYPHOPAK 15/3 EXPRESS 1ST (MISCELLANEOUS) IMPLANT
TRAY KYPHOPAK 20/3 EXPRESS 1ST (MISCELLANEOUS) ×3 IMPLANT

## 2017-02-21 NOTE — Op Note (Signed)
02/21/2017  3:51 PM  PATIENT:  Monica Singh  84 y.o. female  PRE-OPERATIVE DIAGNOSIS:  closed wedge compression fracture of first lumbar vertebra  POST-OPERATIVE DIAGNOSIS:  closed wedge compression fracture of first lumbar vertebra  PROCEDURE:  Procedure(s): KYPHOPLASTY-L1 (N/A)  SURGEON: Laurene Footman, MD  ASSISTANTS: None  ANESTHESIA:   local and MAC  EBL:  Total I/O In: 600 [I.V.:600] Out: 0   BLOOD ADMINISTERED:none  DRAINS: none   LOCAL MEDICATIONS USED:  MARCAINE    and XYLOCAINE   SPECIMEN:  Source of Specimen:  L1 vertebral body  DISPOSITION OF SPECIMEN:  PATHOLOGY  COUNTS:  YES  TOURNIQUET:  * No tourniquets in log *  IMPLANTS: Bone cement  DICTATION: .Dragon Dictation  patient was brought the operating room and after adequate sedation was given the patient was placed prone. C-arm was brought in and good visualization of L1 was obtained that showed vertebral compression. After patient identification and timeout procedures were completed, local anesthetic was infiltrated around the skin in the area of the planned incisions. The back was then prepped and draped in usual sterile manner and repeat timeout procedure carried out. Spinal needle was used to get local anesthetic down to the pedicle on both sides with a 50-50 mix of 1% Xylocaine and half percent Sensorcaine with epinephrine 10 cc of each to both sides. After allowing this to set a small incision was made and trocar used and an transpedicular fashion on the right transpedicular on the left to get into the vertebral body with biopsy obtained drilling was carried out followed by balloon inflation but correction did partially occur. After the cement was the appropriate consistency balloons were down and approximately 6 cc of cement was of treatment on both sides with some extravasation into the pedicle on the right side but did not appear to enter the spinal canal based on rotational views.  After the  cement was set trochars removed and with Dermabond used to close the skin followed by Band-Aids      PLAN OF CARE: Discharge to home after PACU  PATIENT DISPOSITION:  PACU - hemodynamically stable.

## 2017-02-21 NOTE — Transfer of Care (Signed)
Immediate Anesthesia Transfer of Care Note  Patient: [place]  Procedure(s) Performed: Anne Ng (N/A Spine Lumbar)  Patient Location: PACU  Anesthesia Type:General  Level of Consciousness: awake, alert  and oriented  Airway & Oxygen Therapy: Patient Spontanous Breathing and Patient connected to face mask oxygen  Post-op Assessment: Report given to RN and Post -op Vital signs reviewed and stable  Post vital signs: Reviewed and stable  Last Vitals:  Vitals:   02/21/17 1255 02/21/17 1549  BP: 132/77 (!) 151/68  Pulse: 89 96  Resp: 20 (!) 21  Temp: 36.7 C (!) 36.2 C  SpO2: 97% 100%    Last Pain:  Vitals:   02/21/17 1549  TempSrc: Temporal  PainSc:          Complications: No apparent anesthesia complications

## 2017-02-21 NOTE — Anesthesia Postprocedure Evaluation (Signed)
Anesthesia Post Note  Patient: Monica Singh  Procedure(s) Performed: Anne Ng (N/A Spine Lumbar)  Patient location during evaluation: PACU Anesthesia Type: MAC Level of consciousness: awake and alert and oriented Pain management: pain level controlled Vital Signs Assessment: post-procedure vital signs reviewed and stable Respiratory status: spontaneous breathing Cardiovascular status: blood pressure returned to baseline Anesthetic complications: no     Last Vitals:  Vitals:   02/21/17 1655 02/21/17 1855  BP: (!) 143/66 134/69  Pulse: 92 95  Resp: 16 16  Temp:  36.9 C  SpO2: 96% 97%    Last Pain:  Vitals:   02/21/17 1855  TempSrc: Oral  PainSc:                  [place] Levenhagen

## 2017-02-21 NOTE — Anesthesia Preprocedure Evaluation (Addendum)
Anesthesia Evaluation  Patient identified by MRN, date of birth, ID band Patient awake    Reviewed: Allergy & Precautions, NPO status , Patient's Chart, lab work & pertinent test results, reviewed documented beta blocker date and time   Airway Mallampati: III  TM Distance: >3 FB     Dental  (+) Chipped, Upper Dentures   Pulmonary COPD, former smoker,           Cardiovascular hypertension, Pt. on medications      Neuro/Psych    GI/Hepatic GERD  Controlled,  Endo/Other  Hypothyroidism   Renal/GU      Musculoskeletal  (+) Arthritis ,   Abdominal   Peds  Hematology   Anesthesia Other Findings Neck movement ok. Runs a low O2 sat of 92-94%.  Reproductive/Obstetrics                            Anesthesia Physical Anesthesia Plan  ASA: III  Anesthesia Plan: MAC   Post-op Pain Management:    Induction:   PONV Risk Score and Plan:   Airway Management Planned:   Additional Equipment:   Intra-op Plan:   Post-operative Plan:   Informed Consent: I have reviewed the patients History and Physical, chart, labs and discussed the procedure including the risks, benefits and alternatives for the proposed anesthesia with the patient or authorized representative who has indicated his/her understanding and acceptance.     Plan Discussed with: CRNA  Anesthesia Plan Comments:         Anesthesia Quick Evaluation

## 2017-02-21 NOTE — OR Nursing (Signed)
Discussed discharge instructions with pt and daughters. All voice understanding. Pt to be transferred to [facility] place by EMS. [facility] transportation doesn't run after 4:30pm

## 2017-02-21 NOTE — OR Nursing (Signed)
D/C continues to be pending non-emergent transport back to [facility].

## 2017-02-21 NOTE — Discharge Instructions (Addendum)
Take it easy today and tomorrow, resume more normal activities on Sunday. Okay to discontinue brace if pain allows. Remove Band-Aid's on Sunday then okay to shower.  AMBULATORY SURGERY  DISCHARGE INSTRUCTIONS   1) The drugs that you were given will stay in your system until tomorrow so for the next 24 hours you should not:  A) Drive an automobile B) Make any legal decisions C) Drink any alcoholic beverage   2) You may resume regular meals tomorrow.  Today it is better to start with liquids and gradually work up to solid foods.  You may eat anything you prefer, but it is better to start with liquids, then soup and crackers, and gradually work up to solid foods.   3) Please notify your doctor immediately if you have any unusual bleeding, trouble breathing, redness and pain at the surgery site, drainage, fever, or pain not relieved by medication.    4) Additional Instructions:        Please contact your physician with any problems or Same Day Surgery at [phone], Monday through Friday 6 am to 4 pm, or [place] at [facility] number at [phone].

## 2017-02-21 NOTE — Anesthesia Post-op Follow-up Note (Signed)
Anesthesia QCDR form completed.        

## 2017-02-21 NOTE — H&P (Signed)
Reviewed paper H+P, will be scanned into chart. No changes noted.  

## 2017-02-24 ENCOUNTER — Encounter: Payer: Self-pay | Admitting: Orthopedic Surgery

## 2017-02-25 LAB — SURGICAL PATHOLOGY

## 2017-02-26 ENCOUNTER — Other Ambulatory Visit: Payer: Self-pay

## 2017-02-26 MED ORDER — OXYCODONE-ACETAMINOPHEN 5-325 MG PO TABS: 2.0000 | ORAL_TABLET | ORAL | 0 refills | Status: DC | PRN

## 2017-02-26 NOTE — Telephone Encounter (Signed)
Rx sent to [facility] phone : [phone] , fax : [phone]  

## 2017-02-27 ENCOUNTER — Non-Acute Institutional Stay (SKILLED_NURSING_FACILITY): Payer: Medicare Other | Admitting: Gerontology

## 2017-02-27 DIAGNOSIS — K5900 Constipation, unspecified: Secondary | ICD-10-CM

## 2017-02-27 DIAGNOSIS — S32001D Stable burst fracture of unspecified lumbar vertebra, subsequent encounter for fracture with routine healing: Secondary | ICD-10-CM | POA: Diagnosis not present

## 2017-02-28 ENCOUNTER — Encounter
Admission: RE | Admit: 2017-02-28 | Discharge: 2017-02-28 | Disposition: A | Discharge status: Home / Self Care | Payer: Medicare Other | Source: Ambulatory Visit | Attending: Internal Medicine | Admitting: Internal Medicine

## 2017-02-28 ENCOUNTER — Encounter: Payer: Self-pay | Admitting: Gerontology

## 2017-02-28 NOTE — Progress Notes (Signed)
Location:   [facility] Room Number:   Place of Service:  SNF [phone]) Provider:  Toni Arthurs, NP-C  Fisher, Kirstie Peri, MD  Patient Care Team: Birdie Sons, MD as PCP - General (Family Medicine) Dingeldein, Remo Lipps, MD as Consulting Physician (Ophthalmology) Cammie Sickle, MD as Consulting Physician (Internal Medicine)  Extended Emergency Contact Information Primary Emergency Contact: Wilburn,[name] A Address: [address] [place], [place] [address] Johnnette Litter of [place] Phone: [phone] Mobile Phone: [phone] Relation: Daughter  Code Status:  DNR Goals of care: Advanced Directive information Advanced Directives 02/28/2017  Does Patient Have a Medical Advance Directive? Yes  Type of Paramedic of [place];Living will  Does patient want to make changes to medical advance directive? -  Copy of [place] in Chart? Yes     Chief Complaint  Patient presents with  . Medical Management of Chronic Issues    Routine Visit    HPI:  Pt is a 84 y.o. female seen today for medical management of chronic diseases.  Patient was admitted to the facility for rehab following visit to the emergency room for severe back pain showing lumbar burst fracture.  Patient underwent kyphoplasty several days ago.  Patient continues to wear the back brace for support.  Patient has been participating in PT/OT. Mobility is progressing.  Patient reports pain is well controlled on current regimen.  Patient reports her appetite is good and is eating well.  Patient's only complaint is she has not had a bowel movement in almost a week.  Nursing has been giving milk of magnesia as needed and fleets enema as needed with no results.  Area of kyphoplasty CDI.  Patient denies numbness or tingling of the legs.  Vital signs stable.  No other complaints.     Past Medical History:  Diagnosis Date  . Adnexal mass 06/03/2014    since 2009  . 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 GERD (gastroesophageal reflux disease)   . Herpes zoster without complication 04/01/3612  . HTN (hypertension)   . Non Hodgkin's lymphoma ([place])   . Recurrent UTI    Past Surgical History:  Procedure Laterality Date  . ABDOMINAL SURGERY  2009   abdominal mass+ NH lymphoma,  . CATARACT EXTRACTION  1997   right eye  . cervical neck fusion  1995  . DENTAL SURGERY     screws  . KYPHOPLASTY N/A 02/21/2017   Procedure: [id number];  Surgeon: Hessie Knows, MD;  Location: [facility] ORS;  Service: Orthopedics;  Laterality: N/A;  . laparotomy with biopsy  03/02/2007  . PORTACATH PLACEMENT  2009  . [place]  . SQUAMOUS CELL CARCINOMA EXCISION     right arm  . TOTAL KNEE ARTHROPLASTY Right   . VAGINAL HYSTERECTOMY  1971    Allergies  Allergen Reactions  . Lac Bovis Diarrhea and Nausea And Vomiting  . Milk-Related Compounds Diarrhea and Nausea And Vomiting  . Sulfa Antibiotics Hives and Itching    Allergies as of 02/27/2017      Reactions   Milk-related Compounds Diarrhea, Nausea And Vomiting   Sulfa Antibiotics Hives, Itching      Medication List        Accurate as of 02/27/17 11:59 PM. Always use your most recent med list.  albuterol (2.5 MG/3ML) 0.083% nebulizer solution Commonly known as:  PROVENTIL Take 2.5 mg by nebulization every 4 (four) hours as needed for wheezing or shortness of breath.   ASPERCREME LIDOCAINE 4 % Ptch Generic drug:  Lidocaine Apply 1 patch topically daily. Apply to area of pain in the back (L1) , Remove patch after 12 hours   aspirin 325 MG tablet Take 325 mg by mouth 2 (two) times daily at 10 AM and 5 PM.   bisacodyl 10 MG/30ML Enem Commonly known as:  FLEET Place 10 mg rectally daily as needed.   cholecalciferol 400 units Tabs tablet Commonly known as:  VITAMIN D Take 4,000 Units by  mouth.   ENSURE CLEAR PO Take 1 Bottle by mouth 2 (two) times daily with breakfast and lunch.   fexofenadine 180 MG tablet Commonly known as:  ALLEGRA Take 1 tablet (180 mg total) by mouth daily.   fluticasone 50 MCG/ACT nasal spray Commonly known as:  FLONASE Place 2 sprays into both nostrils daily.   Ipratropium-Albuterol 20-100 MCG/ACT Aers respimat Commonly known as:  COMBIVENT Inhale 2 puffs into the lungs 3 (three) times daily.   magnesium hydroxide 400 MG/5ML suspension Commonly known as:  MILK OF MAGNESIA Take 30 mLs by mouth every 4 (four) hours as needed. For constipation/ no BM for 2 days   methocarbamol 500 MG tablet Commonly known as:  ROBAXIN Take 500 mg by mouth every 6 (six) hours as needed for muscle spasms.   oxyCODONE-acetaminophen 5-325 MG tablet Commonly known as:  PERCOCET/ROXICET Take 2 tablets by mouth every 4 (four) hours as needed for severe pain.   risedronate 35 MG tablet Commonly known as:  ACTONEL Take 1 tablet (35 mg total) by mouth every 7 (seven) days. with water on empty stomach, nothing by mouth or lie down for next 30 minutes.   senna-docusate 8.6-50 MG tablet Commonly known as:  Senokot-S Take 2 tablets by mouth 2 (two) times daily.   sorbitol 70 % solution Take 30 mLs by mouth every 2 (two) hours. Until large BM then change order to prn       Review of Systems  Constitutional: Negative for activity change, appetite change, chills, diaphoresis and fever.  HENT: Negative for congestion, mouth sores, nosebleeds, postnasal drip, sneezing, sore throat, trouble swallowing and voice change.   Respiratory: Negative for apnea, cough, choking, chest tightness, shortness of breath and wheezing.   Cardiovascular: Negative for chest pain, palpitations and leg swelling.  Gastrointestinal: Negative for abdominal distention, abdominal pain, constipation, diarrhea and nausea.  Genitourinary: Negative for difficulty urinating, dysuria, frequency  and urgency.  Musculoskeletal: Positive for arthralgias (typical arthritis) and back pain. Negative for gait problem and myalgias.  Skin: Negative for color change, pallor, rash and wound.  Neurological: Negative for dizziness, tremors, syncope, speech difficulty, weakness, numbness and headaches.  Psychiatric/Behavioral: Negative for agitation and behavioral problems.  All other systems reviewed and are negative.   Immunization History  Administered Date(s) Administered  . Influenza Split 12/07/2008, 10/17/2010  . Influenza, High Dose Seasonal PF 10/19/2013, 10/12/2014, 11/20/2015, 11/12/2016  . Influenza,inj,Quad PF,6+ Mos 11/04/2012  . Influenza-Unspecified 09/28/2013  . Pneumococcal Conjugate-13 12/21/2012  . Pneumococcal Polysaccharide-23 12/02/1997  . Tdap 10/17/2010  . Zoster 10/14/2008   Pertinent  Health Maintenance Due  Topic Date Due  . COLONOSCOPY  09/10/1946  . INFLUENZA VACCINE  Completed  . DEXA SCAN  Completed  . PNA vac Low Risk Adult  Completed   Fall Risk  06/07/2016 11/20/2015 10/12/2014  Falls in the past year? No No No   Functional Status Survey:    Vitals:   02/27/17 1433  BP: 135/67  Pulse: 73  Resp: 18  Temp: 98 F (36.7 C)  TempSrc: Oral  SpO2: 98%  Weight: 150 lb (68 kg)  Height: 5\' 2"  (1.575 m)   Body mass index is 27.44 kg/m. Physical Exam  Constitutional: She is oriented to person, place, and time. Vital signs are normal. She appears well-developed and well-nourished. She is active and cooperative. She does not appear ill. No distress.  HENT:  Head: Normocephalic and atraumatic.  Mouth/Throat: Uvula is midline, oropharynx is clear and moist and mucous membranes are normal. Mucous membranes are not pale, not dry and not cyanotic.  Eyes: Conjunctivae, EOM and lids are normal. Pupils are equal, round, and reactive to light.  Neck: Trachea normal, normal range of motion and full passive range of motion without pain. Neck supple. No JVD  present. No tracheal deviation, no edema and no erythema present. No thyromegaly present.  Cardiovascular: Normal rate, regular rhythm, normal heart sounds, intact distal pulses and normal pulses. Exam reveals no gallop, no distant heart sounds and no friction rub.  No murmur heard. Pulses:      Dorsalis pedis pulses are 2+ on the right side, and 2+ on the left side.  No edema  Pulmonary/Chest: Effort normal and breath sounds normal. No accessory muscle usage. No respiratory distress. She has no decreased breath sounds. She has no wheezes. She has no rhonchi. She has no rales. She exhibits no tenderness.  Abdominal: Soft. Normal appearance and bowel sounds are normal. She exhibits no distension and no ascites. There is no tenderness.  Musculoskeletal: She exhibits no edema.       Lumbar back: She exhibits decreased range of motion, tenderness, pain and spasm.  Expected osteoarthritis, stiffness; Bilateral Calves soft, supple. Negative Homan's Sign. B- pedal pulses equal; generalized weakness; lumbar burst fracture with kyphoplasty  Neurological: She is alert and oriented to person, place, and time. She has normal strength.  Skin: Skin is warm, dry and intact. She is not diaphoretic. No cyanosis. No pallor. Nails show no clubbing.  Psychiatric: She has a normal mood and affect. Her speech is normal and behavior is normal. Judgment and thought content normal. Cognition and memory are normal.  Nursing note and vitals reviewed.   Labs reviewed: Recent Labs    06/07/16 1053 11/12/16 1108 02/17/17 0120  NA 135 135 136  K 5.1 4.5 3.6  CL 92* 97* 99*  CO2 27 31 26   GLUCOSE 111* 90 143*  BUN 11 11 14   CREATININE 0.73 0.74 0.79  CALCIUM 9.7 9.7 8.8*   Recent Labs    06/07/16 1053 11/12/16 1108 02/17/17 0120  AST 16 14 23   ALT 15 13 19   ALKPHOS 65  --  55  BILITOT 0.6 0.6 0.4  PROT 7.0 7.1 6.9  ALBUMIN 4.6  --  3.9   Recent Labs    06/07/16 1053 11/12/16 1108 02/17/17 0120  WBC  6.7 6.1 16.3*  NEUTROABS  --   --  14.5*  HGB 14.2 14.2 13.7  HCT 42.1 42.5 40.5  MCV 94 93.4 96.9  PLT 315 313 295   Lab Results  Component Value Date   TSH 2.36 11/12/2016   No results found for: HGBA1C No results found for: CHOL, HDL, LDLCALC, LDLDIRECT, TRIG, CHOLHDL  Significant Diagnostic Results in last 30 days:  Dg Thoracic Spine 2 View  Result Date: 02/17/2017 CLINICAL DATA:  Injury with back pain EXAM: THORACIC SPINE 2 VIEWS COMPARISON:  Chest x-ray 07/05/2015 FINDINGS: Long dextroscoliosis of the spine. Partially visualized cervical hardware. Moderate compression fracture at L1. Remaining vertebral body heights are maintained. IMPRESSION: Moderate compression fracture at L1. Electronically Signed   By: Donavan Foil M.D.   On: 02/17/2017 02:04   Dg Lumbar Spine 2-3 Views  Result Date: 02/21/2017 CLINICAL DATA:  L1 kyphoplasty EXAM: LUMBAR SPINE - 2-3 VIEW; DG C-ARM 61-120 MIN COMPARISON:  Lumbar spine MRI from yesterday FINDINGS: L1 compression fracture with moderate height loss. On the lateral view cement slightly extends posterior to the posterior cortex margin. This may be from mild rotation. No paravertebral cement accumulation. IMPRESSION: Fluoroscopy for L1 compression fracture cement augmentation. Electronically Signed   By: Monte Fantasia M.D.   On: 02/21/2017 16:04   Dg Lumbar Spine Complete  Result Date: 02/17/2017 CLINICAL DATA:  Low back pain after fall EXAM: LUMBAR SPINE - COMPLETE 4+ VIEW COMPARISON:  None. FINDINGS: Moderate levoscoliosis of the lumbar spine. Suspected mild retrolisthesis of L4 on L5. Age indeterminate moderate superior endplate deformity at L1. Moderate degenerative changes at L3-L4 and L5-S1. Aortic atherosclerosis IMPRESSION: 1. Age indeterminate moderate compression fracture at L1 involving the superior endplate 2. Scoliosis and degenerative changes of the spine Electronically Signed   By: Donavan Foil M.D.   On: 02/17/2017 02:02   Dg Pelvis  1-2 Views  Result Date: 02/17/2017 CLINICAL DATA:  Fall with pain EXAM: PELVIS - 1-2 VIEW COMPARISON:  None. FINDINGS: SI joints are symmetric. Pubic symphysis and rami are intact. No acute fracture or dislocation. IMPRESSION: No acute osseous abnormality Electronically Signed   By: Donavan Foil M.D.   On: 02/17/2017 02:05   Ct Lumbar Spine Wo Contrast  Result Date: 02/17/2017 CLINICAL DATA:  Fall with low back pain EXAM: CT LUMBAR SPINE WITHOUT CONTRAST TECHNIQUE: Multidetector CT imaging of the lumbar spine was performed without intravenous contrast administration. Multiplanar CT image reconstructions were also generated. COMPARISON:  None. FINDINGS: Segmentation: 5 lumbar type vertebrae. Alignment: Left convex scoliosis with apex at L2-L3. Vertebrae: There is an incomplete burst fracture of L1 with approximately 25% height loss there is minimal retropulsion of the posterosuperior corner, measuring 2 mm. The fracture involves the superior endplate and the anterior and posterior walls and, therefore, the anterior and middle columns according to the 3 column model. The fracture does not extend to the posterior elements. No other fracture. Paraspinal and other soft tissues: Diffuse aortic atherosclerotic calcification. Visualized retroperitoneal and paraspinous soft tissues are otherwise unremarkable. Disc levels: There is moderate-to-severe spinal canal stenosis and right neural foraminal stenosis at L3-L4 due to disc osteophyte complex and facet hypertrophy. Moderate canal stenosis and moderate left foraminal stenosis at L4-L5. Multilevel lumbar facet arthrosis. IMPRESSION: 1. Acute incomplete burst type fracture L1 with 25% height loss and 2 mm retropulsion without associated spinal canal stenosis. Fracture involves the anterior and middle columns, as delineated by the 3 column model. 2. Moderate-to-severe spinal canal stenosis at L3-L4 and L4-L5 with moderate-to-severe right L3-4 and left L4-5 foraminal  stenosis. Electronically Signed   By: Ulyses Jarred M.D.   On: 02/17/2017 05:40   Mr Lumbar Spine Wo Contrast  Result Date: 02/20/2017 CLINICAL DATA:  [place] 4 days ago with compression fracture and back pain. EXAM: MRI LUMBAR SPINE WITHOUT CONTRAST TECHNIQUE: Multiplanar, multisequence MR imaging of the lumbar spine was performed. No intravenous contrast was administered. COMPARISON:  CT 02/17/2017 FINDINGS:  Segmentation:  5 lumbar type vertebral bodies. Alignment: Curvature convex to the left with the apex at L2-3. 2 mm degenerative anterolisthesis L3-4. Vertebrae: Acute superior endplate fracture at L1 with loss of height of 25%. 2-3 mm of posterior bowing of the posterosuperior margin of the vertebral body without significant encroachment upon the canal. No finding to suggest that this represents anything other than a benign osteoporotic fracture. No second fracture. No second bone lesion. Conus medullaris and cauda equina: Conus extends to the L2 level. Conus and cauda equina appear normal. Paraspinal and other soft tissues: Cyst in the left pelvis measuring up to 5.1 cm, not completely evaluated. There was a simple cyst in the left pelvis by sonography in June of 2018 that measured 4.4 cm. Disc levels: No significant disc finding at L2-3 or above. L3-4: Bilateral facet degeneration and hypertrophy with 2 mm of anterolisthesis. Mild bulging of the disc. Moderate stenosis with potential for neural compression particularly in the right lateral recess. L4-5: Facet degeneration and hypertrophy more on the left. Bulging of the disc. Narrowing of the left lateral recess that could cause neural compression. L5-S1: Mild bulging of the disc. Mild facet degeneration. Mild left foraminal narrowing. IMPRESSION: Acute superior endplate fracture at L1 with loss of height of 25%. Minimal posterior bowing of the posterosuperior margin of the vertebral body but no significant encroachment upon the canal or foramina. There is  no finding to suggest that this is anything other than a benign osteoporotic fracture. Degenerative changes in the lower lumbar spine with spinal stenosis and potential for neural compression in the lateral recesses at L3-4 right more than left, the lateral recesses at L4-5 left more than right, in the intervertebral foramen on the left at L5-S1. Partial demonstration of a cystic abnormality in the left pelvis measuring up to 5.1 cm. Because of the size and potential for enlargement compared to the ultrasound of 07/17/2016, follow-up of this would be suggested. Consensus recommendations for a cyst less than 7 cm would be yearly follow-up by ultrasound in a patient of this age. Electronically Signed   By: Nelson Chimes M.D.   On: 02/20/2017 14:18   Dg C-arm 1-60 Min  Result Date: 02/21/2017 CLINICAL DATA:  L1 kyphoplasty EXAM: LUMBAR SPINE - 2-3 VIEW; DG C-ARM 61-120 MIN COMPARISON:  Lumbar spine MRI from yesterday FINDINGS: L1 compression fracture with moderate height loss. On the lateral view cement slightly extends posterior to the posterior cortex margin. This may be from mild rotation. No paravertebral cement accumulation. IMPRESSION: Fluoroscopy for L1 compression fracture cement augmentation. Electronically Signed   By: Monte Fantasia M.D.   On: 02/21/2017 16:04    Assessment/Plan Closed burst fracture of lumbar vertebra with routine healing  Continue working with PT/OT  Continue exercises as taught by PT/OT  Continue to wear back brace as needed for pain control  Ice pack as needed for pain  Continue Percocet 2 tablets p.o. every 4 hours as needed pain  Continue methocarbamol 500 mg p.o. every 6 hours as needed spasm/cramps  Continue Aspercreme lidocaine 4% patch-1 patch daily, remove after 12 hours  Follow-up with orthopedist as instructed  Constipation, unspecified constipation type  Continue senna S2 tablets p.o. twice daily  Sorbitol 70%-give 30 mL p.o. every 2 hours until  large BM, then change order to as needed  Family/ staff Communication:   Total Time:  Documentation:  Face to Face:  Family/Phone:   Labs/tests ordered:    Medication list reviewed and assessed for  continued appropriateness. Monthly medication orders reviewed and signed.  Vikki Ports, NP-C Geriatrics [facility] [phone], [place] [address] Cell Phone (Mon-Fri 8am-5pm):  [phone] On Call:  [phone] & follow prompts after 5pm & weekends Office Phone:  [phone] Office Fax:  [phone]

## 2017-03-05 ENCOUNTER — Non-Acute Institutional Stay (SKILLED_NURSING_FACILITY): Payer: Medicare Other | Admitting: Gerontology

## 2017-03-05 ENCOUNTER — Encounter: Payer: Self-pay | Admitting: Gerontology

## 2017-03-05 DIAGNOSIS — K5900 Constipation, unspecified: Secondary | ICD-10-CM

## 2017-03-05 DIAGNOSIS — S32001D Stable burst fracture of unspecified lumbar vertebra, subsequent encounter for fracture with routine healing: Secondary | ICD-10-CM

## 2017-03-05 NOTE — Progress Notes (Signed)
Location:   [facility] Room Number: 207B Place of Service:  SNF [phone])  Provider: Toni Arthurs, NP-C  PCP: Birdie Sons, MD Patient Care Team: Birdie Sons, MD as PCP - General (Family Medicine) Dingeldein, Remo Lipps, MD as Consulting Physician (Ophthalmology) Cammie Sickle, MD as Consulting Physician (Internal Medicine)  Extended Emergency Contact Information Primary Emergency Contact: Wilburn,[name] A Address: [address] [place], [place] [address] Johnnette Litter of [place] Phone: [phone] Mobile Phone: [phone] Relation: Daughter  Code Status: DNR Goals of care:  Advanced Directive information Advanced Directives 03/05/2017  Does Patient Have a Medical Advance Directive? Yes  Type of Paramedic of [place];Living will;Out of facility DNR (pink MOST or yellow form)  Does patient want to make changes to medical advance directive? No - Patient declined  Copy of [place] in Chart? Yes     Allergies  Allergen Reactions  . Lac Bovis Diarrhea and Nausea And Vomiting  . Milk-Related Compounds Diarrhea and Nausea And Vomiting  . Sulfa Antibiotics Hives and Itching    Chief Complaint  Patient presents with  . Discharge Note    Discharged from SNF    HPI:  84 y.o. female seen today for discharge evaluation.  Patient was admitted to the facility for rehab following a lumbar burst fracture with subsequent kyphoplasty.  Patient has been participating in PT/OT.  Patient reports pain is well controlled on current regimen.  Patient continues to wear back brace for support.  Patient is ambulating well with the rolling walker.  Last week, patient complaint of constipation, having no BM in almost a week.  Patient was given sorbitol.  Patient's bowels are moving well now.  Patient reports appetite is good and is voiding well.  Area of kyphoplasty healed.  No redness, no drainage.   Patient reports her pain at most is now a 3/10.  Patient reports she is feeling well and ready to go home.  Vital signs stable.  No other complaints.    Past Medical History:  Diagnosis Date  . Adnexal mass 06/03/2014   since 2009  . 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 GERD (gastroesophageal reflux disease)   . Herpes zoster without complication 0/06/2374  . HTN (hypertension)   . Non Hodgkin's lymphoma ([place])   . Recurrent UTI     Past Surgical History:  Procedure Laterality Date  . ABDOMINAL SURGERY  2009   abdominal mass+ NH lymphoma,  . CATARACT EXTRACTION  1997   right eye  . cervical neck fusion  1995  . DENTAL SURGERY     screws  . KYPHOPLASTY N/A 02/21/2017   Procedure: [id number];  Surgeon: Hessie Knows, MD;  Location: [facility] ORS;  Service: Orthopedics;  Laterality: N/A;  . laparotomy with biopsy  03/02/2007  . PORTACATH PLACEMENT  2009  . [place]  . SQUAMOUS CELL CARCINOMA EXCISION     right arm  . TOTAL KNEE ARTHROPLASTY Right   . VAGINAL HYSTERECTOMY  1971      reports that she quit smoking about 27 years ago. Her smoking use included cigarettes. She smoked 0.25 packs per day. She quit smokeless tobacco use about 28 years ago. She reports that she drinks about 8.4 oz of alcohol per week. She reports that she does not use drugs.  Social History   Socioeconomic History  . Marital status: Widowed    Spouse name: Not on file  . Number of children: Not on file  . Years of education: Not on file  . Highest education level: Not on file  Social Needs  . Financial resource strain: Not on file  . Food insecurity - worry: Not on file  . Food insecurity - inability: Not on file  . Transportation needs - medical: Not on file  . Transportation needs - non-medical: Not on file  Occupational History  . Not on file  Tobacco Use  . Smoking status: Former Smoker    Packs/day:  0.25    Types: Cigarettes    Last attempt to quit: 06/21/1989    Years since quitting: 27.7  . Smokeless tobacco: Former Systems developer    Quit date: 01/28/1989  Substance and Sexual Activity  . Alcohol use: Yes    Alcohol/week: 8.4 oz    Types: 14 Glasses of wine per week  . Drug use: No  . Sexual activity: No  Other Topics Concern  . Not on file  Social History Narrative  . Not on file   Functional Status Survey:    Allergies  Allergen Reactions  . Lac Bovis Diarrhea and Nausea And Vomiting  . Milk-Related Compounds Diarrhea and Nausea And Vomiting  . Sulfa Antibiotics Hives and Itching    Pertinent  Health Maintenance Due  Topic Date Due  . COLONOSCOPY  09/10/1948  . INFLUENZA VACCINE  Completed  . DEXA SCAN  Completed  . PNA vac Low Risk Adult  Completed    Medications: Allergies as of 03/05/2017      Reactions   Lac Bovis Diarrhea, Nausea And Vomiting   Milk-related Compounds Diarrhea, Nausea And Vomiting   Sulfa Antibiotics Hives, Itching      Medication List        Accurate as of 03/05/17  3:25 PM. Always use your most recent med list.          albuterol (2.5 MG/3ML) 0.083% nebulizer solution Commonly known as:  PROVENTIL Take 2.5 mg by nebulization every 4 (four) hours as needed for wheezing or shortness of breath.   ALIGN 4 MG Caps Take 1 capsule by mouth daily.   ASPERCREME LIDOCAINE 4 % Ptch Generic drug:  Lidocaine Apply 1 patch topically daily. Apply to area of pain in the back (L1) , Remove patch after 12 hours   aspirin 325 MG tablet Take 325 mg by mouth 2 (two) times daily at 10 AM and 5 PM.   bisacodyl 10 MG/30ML Enem Commonly known as:  FLEET Place 10 mg rectally daily as needed.   Cholecalciferol 4000 units Caps Take 1 capsule by mouth daily.   ENSURE CLEAR PO Take 1 Bottle by mouth 2 (two) times daily with breakfast and lunch.   fexofenadine 180 MG tablet Commonly known as:  ALLEGRA Take 1 tablet (180 mg total) by mouth daily.     fluticasone 50 MCG/ACT nasal spray Commonly known as:  FLONASE Place 2 sprays into both nostrils daily.   Ipratropium-Albuterol 20-100 MCG/ACT Aers respimat Commonly known as:  COMBIVENT Inhale 2 puffs into the lungs 3 (three) times daily.   magnesium hydroxide 400 MG/5ML suspension Commonly known as:  MILK OF MAGNESIA Take 30 mLs by mouth every 4 (four) hours as needed. For constipation/ no BM for 2 days   methocarbamol 500 MG tablet Commonly known as:  ROBAXIN Take 500 mg by mouth every 6 (  six) hours as needed for muscle spasms.   oxyCODONE-acetaminophen 5-325 MG tablet Commonly known as:  PERCOCET/ROXICET Take 2 tablets by mouth every 4 (four) hours as needed for severe pain.   risedronate 35 MG tablet Commonly known as:  ACTONEL Take 1 tablet (35 mg total) by mouth every 7 (seven) days. with water on empty stomach, nothing by mouth or lie down for next 30 minutes.   senna-docusate 8.6-50 MG tablet Commonly known as:  Senokot-S Take 2 tablets by mouth 2 (two) times daily.   sorbitol 70 % solution Take 30 mLs by mouth every 2 (two) hours. Until large BM then change order to prn       Review of Systems  Constitutional: Negative for activity change, appetite change, chills, diaphoresis and fever.  HENT: Negative for congestion, mouth sores, nosebleeds, postnasal drip, sneezing, sore throat, trouble swallowing and voice change.   Respiratory: Negative for apnea, cough, choking, chest tightness, shortness of breath and wheezing.   Cardiovascular: Negative for chest pain, palpitations and leg swelling.  Gastrointestinal: Negative for abdominal distention, abdominal pain, constipation, diarrhea and nausea.  Genitourinary: Negative for difficulty urinating, dysuria, frequency and urgency.  Musculoskeletal: Positive for arthralgias (typical arthritis) and back pain. Negative for gait problem and myalgias.  Skin: Negative for color change, pallor, rash and wound.  Neurological:  Negative for dizziness, tremors, syncope, speech difficulty, weakness, numbness and headaches.  Psychiatric/Behavioral: Negative for agitation and behavioral problems.  All other systems reviewed and are negative.   Vitals:   03/05/17 1516  BP: 127/63  Pulse: 76  Resp: 18  Temp: 98.1 F (36.7 C)  TempSrc: Oral  SpO2: 98%  Weight: 144 lb 8 oz (65.5 kg)  Height: 5\' 2"  (1.575 m)   Body mass index is 26.43 kg/m. Physical Exam  Constitutional: She is oriented to person, place, and time. Vital signs are normal. She appears well-developed and well-nourished. She is active and cooperative. She does not appear ill. No distress.  HENT:  Head: Normocephalic and atraumatic.  Mouth/Throat: Uvula is midline, oropharynx is clear and moist and mucous membranes are normal. Mucous membranes are not pale, not dry and not cyanotic.  Eyes: Conjunctivae, EOM and lids are normal. Pupils are equal, round, and reactive to light.  Neck: Trachea normal, normal range of motion and full passive range of motion without pain. Neck supple. No JVD present. No tracheal deviation, no edema and no erythema present. No thyromegaly present.  Cardiovascular: Normal rate, regular rhythm, normal heart sounds, intact distal pulses and normal pulses. Exam reveals no gallop, no distant heart sounds and no friction rub.  No murmur heard. Pulses:      Dorsalis pedis pulses are 2+ on the right side, and 2+ on the left side.  No edema  Pulmonary/Chest: Effort normal and breath sounds normal. No accessory muscle usage. No respiratory distress. She has no decreased breath sounds. She has no wheezes. She has no rhonchi. She has no rales. She exhibits no tenderness.  Abdominal: Soft. Normal appearance and bowel sounds are normal. She exhibits no distension and no ascites. There is no tenderness.  Musculoskeletal: She exhibits no edema.       Lumbar back: She exhibits decreased range of motion and tenderness.  Expected  osteoarthritis, stiffness; Bilateral Calves soft, supple. Negative Homan's Sign. B- pedal pulses equal; lumbar burst fracture with kyphoplasty; wears back brace  Neurological: She is alert and oriented to person, place, and time. She has normal strength.  Skin: Skin is warm,  dry and intact. She is not diaphoretic. No cyanosis. No pallor. Nails show no clubbing.  Psychiatric: She has a normal mood and affect. Her speech is normal and behavior is normal. Judgment and thought content normal. Cognition and memory are normal.  Nursing note and vitals reviewed.   Labs reviewed: Basic Metabolic Panel: Recent Labs    06/07/16 1053 11/12/16 1108 02/17/17 0120  NA 135 135 136  K 5.1 4.5 3.6  CL 92* 97* 99*  CO2 27 31 26   GLUCOSE 111* 90 143*  BUN 11 11 14   CREATININE 0.73 0.74 0.79  CALCIUM 9.7 9.7 8.8*   Liver Function Tests: Recent Labs    06/07/16 1053 11/12/16 1108 02/17/17 0120  AST 16 14 23   ALT 15 13 19   ALKPHOS 65  --  55  BILITOT 0.6 0.6 0.4  PROT 7.0 7.1 6.9  ALBUMIN 4.6  --  3.9   No results for input(s): LIPASE, AMYLASE in the last 8760 hours. No results for input(s): AMMONIA in the last 8760 hours. CBC: Recent Labs    06/07/16 1053 11/12/16 1108 02/17/17 0120  WBC 6.7 6.1 16.3*  NEUTROABS  --   --  14.5*  HGB 14.2 14.2 13.7  HCT 42.1 42.5 40.5  MCV 94 93.4 96.9  PLT 315 313 295   Cardiac Enzymes: No results for input(s): CKTOTAL, CKMB, CKMBINDEX, TROPONINI in the last 8760 hours. BNP: Invalid input(s): POCBNP CBG: No results for input(s): GLUCAP in the last 8760 hours.  Procedures and Imaging Studies During Stay: Dg Thoracic Spine 2 View  Result Date: 02/17/2017 CLINICAL DATA:  Injury with back pain EXAM: THORACIC SPINE 2 VIEWS COMPARISON:  Chest x-ray 07/05/2015 FINDINGS: Long dextroscoliosis of the spine. Partially visualized cervical hardware. Moderate compression fracture at L1. Remaining vertebral body heights are maintained. IMPRESSION: Moderate  compression fracture at L1. Electronically Signed   By: Donavan Foil M.D.   On: 02/17/2017 02:04   Dg Lumbar Spine 2-3 Views  Result Date: 02/21/2017 CLINICAL DATA:  L1 kyphoplasty EXAM: LUMBAR SPINE - 2-3 VIEW; DG C-ARM 61-120 MIN COMPARISON:  Lumbar spine MRI from yesterday FINDINGS: L1 compression fracture with moderate height loss. On the lateral view cement slightly extends posterior to the posterior cortex margin. This may be from mild rotation. No paravertebral cement accumulation. IMPRESSION: Fluoroscopy for L1 compression fracture cement augmentation. Electronically Signed   By: Monte Fantasia M.D.   On: 02/21/2017 16:04   Dg Lumbar Spine Complete  Result Date: 02/17/2017 CLINICAL DATA:  Low back pain after fall EXAM: LUMBAR SPINE - COMPLETE 4+ VIEW COMPARISON:  None. FINDINGS: Moderate levoscoliosis of the lumbar spine. Suspected mild retrolisthesis of L4 on L5. Age indeterminate moderate superior endplate deformity at L1. Moderate degenerative changes at L3-L4 and L5-S1. Aortic atherosclerosis IMPRESSION: 1. Age indeterminate moderate compression fracture at L1 involving the superior endplate 2. Scoliosis and degenerative changes of the spine Electronically Signed   By: Donavan Foil M.D.   On: 02/17/2017 02:02   Dg Pelvis 1-2 Views  Result Date: 02/17/2017 CLINICAL DATA:  Fall with pain EXAM: PELVIS - 1-2 VIEW COMPARISON:  None. FINDINGS: SI joints are symmetric. Pubic symphysis and rami are intact. No acute fracture or dislocation. IMPRESSION: No acute osseous abnormality Electronically Signed   By: Donavan Foil M.D.   On: 02/17/2017 02:05   Ct Lumbar Spine Wo Contrast  Result Date: 02/17/2017 CLINICAL DATA:  Fall with low back pain EXAM: CT LUMBAR SPINE WITHOUT CONTRAST TECHNIQUE: Multidetector CT imaging  of the lumbar spine was performed without intravenous contrast administration. Multiplanar CT image reconstructions were also generated. COMPARISON:  None. FINDINGS: Segmentation: 5  lumbar type vertebrae. Alignment: Left convex scoliosis with apex at L2-L3. Vertebrae: There is an incomplete burst fracture of L1 with approximately 25% height loss there is minimal retropulsion of the posterosuperior corner, measuring 2 mm. The fracture involves the superior endplate and the anterior and posterior walls and, therefore, the anterior and middle columns according to the 3 column model. The fracture does not extend to the posterior elements. No other fracture. Paraspinal and other soft tissues: Diffuse aortic atherosclerotic calcification. Visualized retroperitoneal and paraspinous soft tissues are otherwise unremarkable. Disc levels: There is moderate-to-severe spinal canal stenosis and right neural foraminal stenosis at L3-L4 due to disc osteophyte complex and facet hypertrophy. Moderate canal stenosis and moderate left foraminal stenosis at L4-L5. Multilevel lumbar facet arthrosis. IMPRESSION: 1. Acute incomplete burst type fracture L1 with 25% height loss and 2 mm retropulsion without associated spinal canal stenosis. Fracture involves the anterior and middle columns, as delineated by the 3 column model. 2. Moderate-to-severe spinal canal stenosis at L3-L4 and L4-L5 with moderate-to-severe right L3-4 and left L4-5 foraminal stenosis. Electronically Signed   By: Ulyses Jarred M.D.   On: 02/17/2017 05:40   Mr Lumbar Spine Wo Contrast  Result Date: 02/20/2017 CLINICAL DATA:  [place] 4 days ago with compression fracture and back pain. EXAM: MRI LUMBAR SPINE WITHOUT CONTRAST TECHNIQUE: Multiplanar, multisequence MR imaging of the lumbar spine was performed. No intravenous contrast was administered. COMPARISON:  CT 02/17/2017 FINDINGS: Segmentation:  5 lumbar type vertebral bodies. Alignment: Curvature convex to the left with the apex at L2-3. 2 mm degenerative anterolisthesis L3-4. Vertebrae: Acute superior endplate fracture at L1 with loss of height of 25%. 2-3 mm of posterior bowing of the  posterosuperior margin of the vertebral body without significant encroachment upon the canal. No finding to suggest that this represents anything other than a benign osteoporotic fracture. No second fracture. No second bone lesion. Conus medullaris and cauda equina: Conus extends to the L2 level. Conus and cauda equina appear normal. Paraspinal and other soft tissues: Cyst in the left pelvis measuring up to 5.1 cm, not completely evaluated. There was a simple cyst in the left pelvis by sonography in June of 2018 that measured 4.4 cm. Disc levels: No significant disc finding at L2-3 or above. L3-4: Bilateral facet degeneration and hypertrophy with 2 mm of anterolisthesis. Mild bulging of the disc. Moderate stenosis with potential for neural compression particularly in the right lateral recess. L4-5: Facet degeneration and hypertrophy more on the left. Bulging of the disc. Narrowing of the left lateral recess that could cause neural compression. L5-S1: Mild bulging of the disc. Mild facet degeneration. Mild left foraminal narrowing. IMPRESSION: Acute superior endplate fracture at L1 with loss of height of 25%. Minimal posterior bowing of the posterosuperior margin of the vertebral body but no significant encroachment upon the canal or foramina. There is no finding to suggest that this is anything other than a benign osteoporotic fracture. Degenerative changes in the lower lumbar spine with spinal stenosis and potential for neural compression in the lateral recesses at L3-4 right more than left, the lateral recesses at L4-5 left more than right, in the intervertebral foramen on the left at L5-S1. Partial demonstration of a cystic abnormality in the left pelvis measuring up to 5.1 cm. Because of the size and potential for enlargement compared to the ultrasound of 07/17/2016, follow-up of  this would be suggested. Consensus recommendations for a cyst less than 7 cm would be yearly follow-up by ultrasound in a patient of  this age. Electronically Signed   By: Nelson Chimes M.D.   On: 02/20/2017 14:18   Dg C-arm 1-60 Min  Result Date: 02/21/2017 CLINICAL DATA:  L1 kyphoplasty EXAM: LUMBAR SPINE - 2-3 VIEW; DG C-ARM 61-120 MIN COMPARISON:  Lumbar spine MRI from yesterday FINDINGS: L1 compression fracture with moderate height loss. On the lateral view cement slightly extends posterior to the posterior cortex margin. This may be from mild rotation. No paravertebral cement accumulation. IMPRESSION: Fluoroscopy for L1 compression fracture cement augmentation. Electronically Signed   By: Monte Fantasia M.D.   On: 02/21/2017 16:04    Assessment/Plan:   Closed burst fracture of lumbar vertebra with routine healing  Continue PT/OT  Continue exercises as taught by PT/OT  Continue ice to the back as needed for pain or edema  Continue to wear back brace as needed for support  Continue Percocet 5 325 mg 2 tablets p.o. every 4 hours as needed pain  Continue Robaxin 500 mg p.o. every 6 hours as needed spasm/cramps  Continue Aspercreme lidocaine 4% patch-1 patch daily.  Remove after 12 hours  Follow-up with orthopedist as instructed ASAP after discharge for continuity of care  Constipation, unspecified constipation type  Continue senna S2 tablets p.o. twice daily  Increase p.o. fluid intake   Patient is being discharged with the following home health services: Home health PT/OT through [facility]  Patient is being discharged with the following durable medical equipment: Rolling walker  Patient has been advised to f/u with their PCP in 1-2 weeks to bring them up to date on their rehab stay.  Social services at facility was responsible for arranging this appointment.  Pt was provided with a 30 day supply of prescriptions for medications and refills must be obtained from their PCP.  For controlled substances, a more limited supply may be provided adequate until PCP appointment only.  Future labs/tests needed:     Family/ staff Communication:   Total Time:  Documentation:  Face to Face:  Family/Phone:  Vikki Ports, NP-C [facility] [address], [place] [address] Cell Phone (Mon-Fri 8am-5pm):  [phone] On Call:  [phone] & follow prompts after 5pm & weekends Office Phone:  [phone] Office Fax:  [phone]

## 2017-03-07 DIAGNOSIS — S32010A Wedge compression fracture of first lumbar vertebra, initial encounter for closed fracture: Secondary | ICD-10-CM | POA: Diagnosis not present

## 2017-03-08 DIAGNOSIS — Z85828 Personal history of other malignant neoplasm of skin: Secondary | ICD-10-CM | POA: Diagnosis not present

## 2017-03-08 DIAGNOSIS — M8008XD Age-related osteoporosis with current pathological fracture, vertebra(e), subsequent encounter for fracture with routine healing: Secondary | ICD-10-CM | POA: Diagnosis not present

## 2017-03-08 DIAGNOSIS — J449 Chronic obstructive pulmonary disease, unspecified: Secondary | ICD-10-CM | POA: Diagnosis not present

## 2017-03-08 DIAGNOSIS — Z87891 Personal history of nicotine dependence: Secondary | ICD-10-CM | POA: Diagnosis not present

## 2017-03-08 DIAGNOSIS — Z7982 Long term (current) use of aspirin: Secondary | ICD-10-CM | POA: Diagnosis not present

## 2017-03-08 DIAGNOSIS — Z792 Long term (current) use of antibiotics: Secondary | ICD-10-CM | POA: Diagnosis not present

## 2017-03-08 DIAGNOSIS — Z8619 Personal history of other infectious and parasitic diseases: Secondary | ICD-10-CM | POA: Diagnosis not present

## 2017-03-08 DIAGNOSIS — Z9181 History of falling: Secondary | ICD-10-CM | POA: Diagnosis not present

## 2017-03-08 DIAGNOSIS — Z96652 Presence of left artificial knee joint: Secondary | ICD-10-CM | POA: Diagnosis not present

## 2017-03-08 DIAGNOSIS — J309 Allergic rhinitis, unspecified: Secondary | ICD-10-CM | POA: Diagnosis not present

## 2017-03-08 DIAGNOSIS — N390 Urinary tract infection, site not specified: Secondary | ICD-10-CM | POA: Diagnosis not present

## 2017-03-08 DIAGNOSIS — I10 Essential (primary) hypertension: Secondary | ICD-10-CM | POA: Diagnosis not present

## 2017-03-08 DIAGNOSIS — Z8572 Personal history of non-Hodgkin lymphomas: Secondary | ICD-10-CM | POA: Diagnosis not present

## 2017-03-10 ENCOUNTER — Telehealth: Payer: Self-pay | Admitting: Family Medicine

## 2017-03-10 DIAGNOSIS — R1111 Vomiting without nausea: Secondary | ICD-10-CM

## 2017-03-10 NOTE — Telephone Encounter (Signed)
Patient's daughter, [name], states that patient is still vomiting after meals and had discussed this with you at her last office visit.  You told her to try align and it is not helping.  She would like to know if you could refer her to Dr. Keith Rake at [facility].  Patient has fallen and broke her back so it would be hard to get her out to come into the office if it is not necessary.  She has an appointment with you on 03/10/2017 at 1:45 and would rather not come if she does not need to.  Please advise.

## 2017-03-11 ENCOUNTER — Ambulatory Visit: Payer: Medicare Other | Admitting: Family Medicine

## 2017-03-11 DIAGNOSIS — M8008XD Age-related osteoporosis with current pathological fracture, vertebra(e), subsequent encounter for fracture with routine healing: Secondary | ICD-10-CM | POA: Diagnosis not present

## 2017-03-11 DIAGNOSIS — Z96652 Presence of left artificial knee joint: Secondary | ICD-10-CM | POA: Diagnosis not present

## 2017-03-11 DIAGNOSIS — Z8619 Personal history of other infectious and parasitic diseases: Secondary | ICD-10-CM | POA: Diagnosis not present

## 2017-03-11 DIAGNOSIS — Z7982 Long term (current) use of aspirin: Secondary | ICD-10-CM | POA: Diagnosis not present

## 2017-03-11 DIAGNOSIS — J309 Allergic rhinitis, unspecified: Secondary | ICD-10-CM | POA: Diagnosis not present

## 2017-03-11 DIAGNOSIS — Z792 Long term (current) use of antibiotics: Secondary | ICD-10-CM | POA: Diagnosis not present

## 2017-03-11 DIAGNOSIS — Z85828 Personal history of other malignant neoplasm of skin: Secondary | ICD-10-CM | POA: Diagnosis not present

## 2017-03-11 DIAGNOSIS — Z8572 Personal history of non-Hodgkin lymphomas: Secondary | ICD-10-CM | POA: Diagnosis not present

## 2017-03-11 DIAGNOSIS — Z87891 Personal history of nicotine dependence: Secondary | ICD-10-CM | POA: Diagnosis not present

## 2017-03-11 DIAGNOSIS — Z9181 History of falling: Secondary | ICD-10-CM | POA: Diagnosis not present

## 2017-03-11 DIAGNOSIS — I10 Essential (primary) hypertension: Secondary | ICD-10-CM | POA: Diagnosis not present

## 2017-03-11 DIAGNOSIS — N390 Urinary tract infection, site not specified: Secondary | ICD-10-CM | POA: Diagnosis not present

## 2017-03-11 DIAGNOSIS — J449 Chronic obstructive pulmonary disease, unspecified: Secondary | ICD-10-CM | POA: Diagnosis not present

## 2017-03-11 NOTE — Telephone Encounter (Signed)
Please refer GI as requested

## 2017-03-12 DIAGNOSIS — M8008XD Age-related osteoporosis with current pathological fracture, vertebra(e), subsequent encounter for fracture with routine healing: Secondary | ICD-10-CM | POA: Diagnosis not present

## 2017-03-12 DIAGNOSIS — J309 Allergic rhinitis, unspecified: Secondary | ICD-10-CM | POA: Diagnosis not present

## 2017-03-12 DIAGNOSIS — Z9181 History of falling: Secondary | ICD-10-CM | POA: Diagnosis not present

## 2017-03-12 DIAGNOSIS — J449 Chronic obstructive pulmonary disease, unspecified: Secondary | ICD-10-CM | POA: Diagnosis not present

## 2017-03-12 DIAGNOSIS — Z7982 Long term (current) use of aspirin: Secondary | ICD-10-CM | POA: Diagnosis not present

## 2017-03-12 DIAGNOSIS — Z96652 Presence of left artificial knee joint: Secondary | ICD-10-CM | POA: Diagnosis not present

## 2017-03-12 DIAGNOSIS — Z8572 Personal history of non-Hodgkin lymphomas: Secondary | ICD-10-CM | POA: Diagnosis not present

## 2017-03-12 DIAGNOSIS — N390 Urinary tract infection, site not specified: Secondary | ICD-10-CM | POA: Diagnosis not present

## 2017-03-12 DIAGNOSIS — I10 Essential (primary) hypertension: Secondary | ICD-10-CM | POA: Diagnosis not present

## 2017-03-12 DIAGNOSIS — Z85828 Personal history of other malignant neoplasm of skin: Secondary | ICD-10-CM | POA: Diagnosis not present

## 2017-03-12 DIAGNOSIS — Z8619 Personal history of other infectious and parasitic diseases: Secondary | ICD-10-CM | POA: Diagnosis not present

## 2017-03-12 DIAGNOSIS — Z792 Long term (current) use of antibiotics: Secondary | ICD-10-CM | POA: Diagnosis not present

## 2017-03-12 DIAGNOSIS — Z87891 Personal history of nicotine dependence: Secondary | ICD-10-CM | POA: Diagnosis not present

## 2017-03-13 DIAGNOSIS — L259 Unspecified contact dermatitis, unspecified cause: Secondary | ICD-10-CM | POA: Diagnosis not present

## 2017-03-13 DIAGNOSIS — L219 Seborrheic dermatitis, unspecified: Secondary | ICD-10-CM | POA: Diagnosis not present

## 2017-03-14 ENCOUNTER — Other Ambulatory Visit: Payer: Self-pay | Admitting: Student

## 2017-03-14 DIAGNOSIS — R1111 Vomiting without nausea: Secondary | ICD-10-CM

## 2017-03-14 DIAGNOSIS — K219 Gastro-esophageal reflux disease without esophagitis: Secondary | ICD-10-CM | POA: Diagnosis not present

## 2017-03-14 DIAGNOSIS — R1310 Dysphagia, unspecified: Secondary | ICD-10-CM | POA: Diagnosis not present

## 2017-03-15 DIAGNOSIS — Z7982 Long term (current) use of aspirin: Secondary | ICD-10-CM | POA: Diagnosis not present

## 2017-03-15 DIAGNOSIS — N390 Urinary tract infection, site not specified: Secondary | ICD-10-CM | POA: Diagnosis not present

## 2017-03-15 DIAGNOSIS — Z9181 History of falling: Secondary | ICD-10-CM | POA: Diagnosis not present

## 2017-03-15 DIAGNOSIS — Z85828 Personal history of other malignant neoplasm of skin: Secondary | ICD-10-CM | POA: Diagnosis not present

## 2017-03-15 DIAGNOSIS — Z792 Long term (current) use of antibiotics: Secondary | ICD-10-CM | POA: Diagnosis not present

## 2017-03-15 DIAGNOSIS — J449 Chronic obstructive pulmonary disease, unspecified: Secondary | ICD-10-CM | POA: Diagnosis not present

## 2017-03-15 DIAGNOSIS — Z96652 Presence of left artificial knee joint: Secondary | ICD-10-CM | POA: Diagnosis not present

## 2017-03-15 DIAGNOSIS — M8008XD Age-related osteoporosis with current pathological fracture, vertebra(e), subsequent encounter for fracture with routine healing: Secondary | ICD-10-CM | POA: Diagnosis not present

## 2017-03-15 DIAGNOSIS — J309 Allergic rhinitis, unspecified: Secondary | ICD-10-CM | POA: Diagnosis not present

## 2017-03-15 DIAGNOSIS — Z87891 Personal history of nicotine dependence: Secondary | ICD-10-CM | POA: Diagnosis not present

## 2017-03-15 DIAGNOSIS — Z8572 Personal history of non-Hodgkin lymphomas: Secondary | ICD-10-CM | POA: Diagnosis not present

## 2017-03-15 DIAGNOSIS — I10 Essential (primary) hypertension: Secondary | ICD-10-CM | POA: Diagnosis not present

## 2017-03-15 DIAGNOSIS — Z8619 Personal history of other infectious and parasitic diseases: Secondary | ICD-10-CM | POA: Diagnosis not present

## 2017-03-17 ENCOUNTER — Other Ambulatory Visit: Payer: Self-pay | Admitting: Family Medicine

## 2017-03-17 DIAGNOSIS — Z1231 Encounter for screening mammogram for malignant neoplasm of breast: Secondary | ICD-10-CM

## 2017-03-18 ENCOUNTER — Telehealth: Payer: Self-pay

## 2017-03-18 DIAGNOSIS — Z8619 Personal history of other infectious and parasitic diseases: Secondary | ICD-10-CM | POA: Diagnosis not present

## 2017-03-18 DIAGNOSIS — N390 Urinary tract infection, site not specified: Secondary | ICD-10-CM | POA: Diagnosis not present

## 2017-03-18 DIAGNOSIS — Z792 Long term (current) use of antibiotics: Secondary | ICD-10-CM | POA: Diagnosis not present

## 2017-03-18 DIAGNOSIS — Z9181 History of falling: Secondary | ICD-10-CM | POA: Diagnosis not present

## 2017-03-18 DIAGNOSIS — I10 Essential (primary) hypertension: Secondary | ICD-10-CM | POA: Diagnosis not present

## 2017-03-18 DIAGNOSIS — M8008XD Age-related osteoporosis with current pathological fracture, vertebra(e), subsequent encounter for fracture with routine healing: Secondary | ICD-10-CM | POA: Diagnosis not present

## 2017-03-18 DIAGNOSIS — Z87891 Personal history of nicotine dependence: Secondary | ICD-10-CM | POA: Diagnosis not present

## 2017-03-18 DIAGNOSIS — Z85828 Personal history of other malignant neoplasm of skin: Secondary | ICD-10-CM | POA: Diagnosis not present

## 2017-03-18 DIAGNOSIS — J309 Allergic rhinitis, unspecified: Secondary | ICD-10-CM | POA: Diagnosis not present

## 2017-03-18 DIAGNOSIS — Z96652 Presence of left artificial knee joint: Secondary | ICD-10-CM | POA: Diagnosis not present

## 2017-03-18 DIAGNOSIS — Z7982 Long term (current) use of aspirin: Secondary | ICD-10-CM | POA: Diagnosis not present

## 2017-03-18 DIAGNOSIS — J449 Chronic obstructive pulmonary disease, unspecified: Secondary | ICD-10-CM | POA: Diagnosis not present

## 2017-03-18 DIAGNOSIS — Z8572 Personal history of non-Hodgkin lymphomas: Secondary | ICD-10-CM | POA: Diagnosis not present

## 2017-03-18 NOTE — Telephone Encounter (Signed)
Patients daughter had called and wanted to inquire about patients medication list. She states that patient had back surgery on the 26th and doctor has her taking Aspirin 325 BID, daughter has concerns that strength is to high and wants to know if patient will be okay taking medication? KW

## 2017-03-18 NOTE — Telephone Encounter (Signed)
Please advise 

## 2017-03-19 NOTE — Telephone Encounter (Signed)
Can change to 81mg  enteric coated aspirin once a day.

## 2017-03-19 NOTE — Telephone Encounter (Signed)
[name] was advised.

## 2017-03-20 ENCOUNTER — Ambulatory Visit
Admission: RE | Admit: 2017-03-20 | Discharge: 2017-03-20 | Disposition: A | Discharge status: Home / Self Care | Payer: Medicare Other | Source: Ambulatory Visit | Attending: Student | Admitting: Student

## 2017-03-20 DIAGNOSIS — K449 Diaphragmatic hernia without obstruction or gangrene: Secondary | ICD-10-CM | POA: Diagnosis not present

## 2017-03-20 DIAGNOSIS — Z96652 Presence of left artificial knee joint: Secondary | ICD-10-CM | POA: Diagnosis not present

## 2017-03-20 DIAGNOSIS — Z8572 Personal history of non-Hodgkin lymphomas: Secondary | ICD-10-CM | POA: Diagnosis not present

## 2017-03-20 DIAGNOSIS — N390 Urinary tract infection, site not specified: Secondary | ICD-10-CM | POA: Diagnosis not present

## 2017-03-20 DIAGNOSIS — R1111 Vomiting without nausea: Secondary | ICD-10-CM | POA: Insufficient documentation

## 2017-03-20 DIAGNOSIS — R1310 Dysphagia, unspecified: Secondary | ICD-10-CM | POA: Diagnosis not present

## 2017-03-20 DIAGNOSIS — J449 Chronic obstructive pulmonary disease, unspecified: Secondary | ICD-10-CM | POA: Diagnosis not present

## 2017-03-20 DIAGNOSIS — K228 Other specified diseases of esophagus: Secondary | ICD-10-CM | POA: Insufficient documentation

## 2017-03-20 DIAGNOSIS — I10 Essential (primary) hypertension: Secondary | ICD-10-CM | POA: Diagnosis not present

## 2017-03-20 DIAGNOSIS — K219 Gastro-esophageal reflux disease without esophagitis: Secondary | ICD-10-CM | POA: Diagnosis not present

## 2017-03-20 DIAGNOSIS — Z8619 Personal history of other infectious and parasitic diseases: Secondary | ICD-10-CM | POA: Diagnosis not present

## 2017-03-20 DIAGNOSIS — Z792 Long term (current) use of antibiotics: Secondary | ICD-10-CM | POA: Diagnosis not present

## 2017-03-20 DIAGNOSIS — M8008XD Age-related osteoporosis with current pathological fracture, vertebra(e), subsequent encounter for fracture with routine healing: Secondary | ICD-10-CM | POA: Diagnosis not present

## 2017-03-20 DIAGNOSIS — Z87891 Personal history of nicotine dependence: Secondary | ICD-10-CM | POA: Diagnosis not present

## 2017-03-20 DIAGNOSIS — Z85828 Personal history of other malignant neoplasm of skin: Secondary | ICD-10-CM | POA: Diagnosis not present

## 2017-03-20 DIAGNOSIS — Z9181 History of falling: Secondary | ICD-10-CM | POA: Diagnosis not present

## 2017-03-20 DIAGNOSIS — Z7982 Long term (current) use of aspirin: Secondary | ICD-10-CM | POA: Diagnosis not present

## 2017-03-20 DIAGNOSIS — J309 Allergic rhinitis, unspecified: Secondary | ICD-10-CM | POA: Diagnosis not present

## 2017-03-24 ENCOUNTER — Telehealth: Payer: Self-pay

## 2017-03-24 DIAGNOSIS — M25552 Pain in left hip: Secondary | ICD-10-CM | POA: Diagnosis not present

## 2017-03-24 DIAGNOSIS — Z9889 Other specified postprocedural states: Secondary | ICD-10-CM | POA: Diagnosis not present

## 2017-03-24 NOTE — Telephone Encounter (Signed)
Received call from [place] with request for Ms. Tejera to reestablish care for adnexal cyst that is enlarging. Called Ms.Junio and she is requesting appointment this week due to increasing pain. Appointment arranged for 2/27 at 1330.  Oncology Nurse Navigator Documentation  Navigator Location: [facility] (03/24/17 1500)   )Navigator Encounter Type: Telephone (03/24/17 1500) Telephone: Incoming Call;Outgoing Call;Appt Confirmation/Clarification (03/24/17 1500)                                                  Time Spent with Patient: 15 (03/24/17 1500)

## 2017-03-25 ENCOUNTER — Ambulatory Visit
Admission: RE | Admit: 2017-03-25 | Discharge: 2017-03-25 | Disposition: A | Discharge status: Home / Self Care | Payer: Medicare Other | Source: Ambulatory Visit | Attending: Orthopedic Surgery | Admitting: Orthopedic Surgery

## 2017-03-25 ENCOUNTER — Other Ambulatory Visit: Payer: Self-pay | Admitting: Orthopedic Surgery

## 2017-03-25 DIAGNOSIS — Z90710 Acquired absence of both cervix and uterus: Secondary | ICD-10-CM | POA: Insufficient documentation

## 2017-03-25 DIAGNOSIS — N83202 Unspecified ovarian cyst, left side: Secondary | ICD-10-CM | POA: Diagnosis not present

## 2017-03-25 DIAGNOSIS — Z9889 Other specified postprocedural states: Secondary | ICD-10-CM | POA: Diagnosis not present

## 2017-03-25 DIAGNOSIS — N838 Other noninflammatory disorders of ovary, fallopian tube and broad ligament: Secondary | ICD-10-CM | POA: Diagnosis not present

## 2017-03-25 NOTE — Progress Notes (Signed)
Gynecologic Oncology Interval Note  Referring MD: Dr. Forest Gleason   PCP MD: Margarita Rana, MD [address] [place], [place] [address] [phone]  Chief Complaint: Ovarian cyst surveillance.   Subjective:  [name] is a 84 y.o. woman.  Patient presents today for continued surveillance for history of ovarian cyst and new episode of left sided pelvic and hip pain.    Pelvic ultrasound 03/25/2017 FINDINGS: Uterus Hysterectomy. Right ovary Not visualized Left ovary Not well visualized. A 4.2 x 3.6 x 2.7 cm cyst, most likely simple, is again noted in the left adnexal Other findings:  No abnormal free fluid.  IMPRESSION: A 4.2 x 3.6 x 2.7 cm cyst, most likely simple, is again noted in the left adnexal region. Similar finding noted on prior exam. No significant interim change. This is almost certainly benign, but follow up ultrasound is recommended in 1 year according to the Society of Radiologists in [place] Statement (D Clovis Riley et al. Management of Asymptomatic Ovarian and Other Adnexal Cysts Imaged at [place]: Society of Radiologists in [place] Statement 2010. Radiology 256 (Sept 2010): 914-782.).   Today, she states her recent pain in her left pelvis/left hip area has resolved. Pain occurred 3-4 days ago rated 9/10 causing her to dry heave. It presented spontaneously and resolved spontaneously. It last 2 days. She has recently had back surgery after a fall at home.    We reviewed, "A recent expert review suggested that low-risk abnormalities can undergo an initial 41-month follow-up, with those that remain stable or decreasing in size being examined every 12 months for 5 years."  Monica Singh. Evaluation and management of ultrasonographically detected ovarian tumors in asymptomatic women. Obstet Gynecol 2016; 956:213-086.   Oncology Treatment History:  Monica Singh has a history of diffuse large  cell lymphoma stage III, CD 20 positive, status post 6 cycles of R-CHOP, now followed by Dr. Janese Banks with medical-oncology. She had been seen by Dr. Sabra Heck for several years for an adnexal mass has been present since 2009. She is status post hysterectomy. Her history is as follows:  CA-125 on 01/2011 was 12.4  03/01/2011 she had an ultrasound compared to 2011. Within the left neck for region there is a cystic mass measuring 2.1 x 3.98 x 3.2 cm. What appeared to be the ovary included a cyst measuring 4.3 x 4.32 x 3.32 cm.   CA-125 on 03/2011 was 13.0  Repeat ultrasound 09/03/2011 the left ovary measured 4.2 x 3.1 x 3.4 cm. The cystic mass measured 3.2 x 2.5 x 2.9 cm. The cystic mass was relatively anechoic without a dominant area of nodularity. There may be mild thickening of the wall but similar to prior.   CA-125 on 09/2011 was 11.3.   Ultrasound on 10/08/2012 on the left ovary demonstrated and measured 4.5 x 4.1 x 3.5 cm and contained a 4 x 3.2 x 3.7 cm cystic area there is persistent solid and cystic adnexal process presumed to reflect ovary and associated ovarian cyst.  Her ultrasound 06/13/2014 compared to 11/26/2013 and 10/08/2012  was reasssuring as noted below. Her CA125 was 11.7.  Measurements: 4.5 x 3.9 x 3.6 cm. There is an anechoic structure measuring 3.6 x 3.6 x 3.4 cm which is slightly smaller than on the previous study. Previous dimensions of this anechoic structure were 4.4 x 3.4 x 3.4 cm  IMPRESSION: 1. Slight interval decrease in the size of the hypoechoic slightly irregularly marginated left ovarian cystic structure.  2. The right ovary could not be demonstrated. The uterus is surgically absent.  06/13/2014 Pelvic ultrasound Left ovary Measurements: Left ovary not definitively seen. Simple appearing adnexal cyst measuring 4.2 x 4.4 x 3.9 cm. This is unchanged in size and appearance since prior study      Problem List: Patient Active Problem List   Diagnosis Date Noted  .  Osteoporosis 11/20/2015  . COPD (chronic obstructive pulmonary disease) ([place]) 11/20/2015  . Cyst of ovary 07/26/2015  . Status post total left knee replacement 04/03/2015  . Abnormal chest sounds 08/25/2014  . Allergic rhinitis 08/25/2014  . Colon polyp 08/25/2014  . DDD (degenerative disc disease), cervical 08/25/2014  . DDD (degenerative disc disease), lumbar 08/25/2014  . H/O non-Hodgkin's lymphoma 08/25/2014  . Below normal amount of sodium in the blood 08/25/2014  . Arthritis of knee, degenerative 08/25/2014  . Subclinical hypothyroidism 08/25/2014  . Adnexal mass 06/03/2014  . 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 History of smoking 03/11/2008    Past Medical History: Past Medical History:  Diagnosis Date  . Adnexal mass 06/03/2014   since 2009  . 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 GERD (gastroesophageal reflux disease)   . Herpes zoster without complication 06/29/3760  . HTN (hypertension)   . Non Hodgkin's lymphoma ([place])   . Recurrent UTI     Past Surgical History: Past Surgical History:  Procedure Laterality Date  . ABDOMINAL SURGERY  2009   abdominal mass+ NH lymphoma,  . BACK SURGERY    . CATARACT EXTRACTION  1997   right eye  . cervical neck fusion  1995  . DENTAL SURGERY     screws  . KYPHOPLASTY N/A 02/21/2017   Procedure: [id number];  Surgeon: Hessie Knows, MD;  Location: [facility] ORS;  Service: Orthopedics;  Laterality: N/A;  . laparotomy with biopsy  03/02/2007  . PORTACATH PLACEMENT  2009  . [place]  . SQUAMOUS CELL CARCINOMA EXCISION     right arm  . TOTAL KNEE ARTHROPLASTY Right   . VAGINAL HYSTERECTOMY  1971    Family History: Family History  Problem Relation Age of Onset  . Breast cancer Sister   . Dementia Sister   . Cataracts Sister   . Heart attack Brother   . CAD Brother   . Heart attack Brother   . Leukemia Grandchild         granddaughter  . Kidney disease Neg Hx   . Bladder Cancer Neg Hx     Social History: Social History   Socioeconomic History  . Marital status: Widowed    Spouse name: Not on file  . Number of children: Not on file  . Years of education: Not on file  . Highest education level: Not on file  Social Needs  . Financial resource strain: Not on file  . Food insecurity - worry: Not on file  . Food insecurity - inability: Not on file  . Transportation needs - medical: Not on file  . Transportation needs - non-medical: Not on file  Occupational History  . Not on file  Tobacco Use  . Smoking status: Former Smoker    Packs/day: 0.25    Types: Cigarettes    Last attempt to quit: 06/21/1989    Years since quitting: 27.7  . Smokeless tobacco: Former Systems developer    Quit date: 01/28/1989  Substance and Sexual Activity  .  Alcohol use: Yes    Alcohol/week: 8.4 oz    Types: 14 Glasses of wine per week  . Drug use: No  . Sexual activity: No  Other Topics Concern  . Not on file  Social History Narrative  . Not on file    Allergies: Allergies  Allergen Reactions  . Lac Bovis Diarrhea and Nausea And Vomiting  . Milk-Related Compounds Diarrhea and Nausea And Vomiting  . Sulfa Antibiotics Hives and Itching    Current Medications: Current Outpatient Medications  Medication Sig Dispense Refill  . aspirin 81 MG tablet Take 81 mg by mouth daily.    . Calcium Carbonate (CALCIUM 600 PO) Take 1 tablet by mouth daily.    . Cholecalciferol 4000 units CAPS Take 1 capsule by mouth daily.    . fexofenadine (ALLEGRA) 180 MG tablet Take 1 tablet (180 mg total) by mouth daily. 30 tablet 2  . fluticasone (FLONASE) 50 MCG/ACT nasal spray Place 2 sprays into both nostrils daily. 16 g 6  . Ipratropium-Albuterol (COMBIVENT) 20-100 MCG/ACT AERS respimat Inhale 2 puffs into the lungs 3 (three) times daily.    Marland Kitchen omeprazole (PRILOSEC) 20 MG capsule Take 20 mg by mouth daily.    . Probiotic Product (ALIGN) 4 MG CAPS  Take 1 capsule by mouth daily.    . risedronate (ACTONEL) 35 MG tablet Take 1 tablet (35 mg total) by mouth every 7 (seven) days. with water on empty stomach, nothing by mouth or lie down for next 30 minutes. 4 tablet 12  . senna-docusate (SENOKOT-S) 8.6-50 MG tablet Take 2 tablets by mouth 2 (two) times daily.    Marland Kitchen albuterol (PROVENTIL) (2.5 MG/3ML) 0.083% nebulizer solution Take 2.5 mg by nebulization every 4 (four) hours as needed for wheezing or shortness of breath.    Marland Kitchen aspirin 325 MG tablet Take 325 mg by mouth 2 (two) times daily at 10 AM and 5 PM.     . bisacodyl (FLEET) 10 MG/30ML ENEM Place 10 mg rectally daily as needed.    . Lidocaine (ASPERCREME LIDOCAINE) 4 % PTCH Apply 1 patch topically daily. Apply to area of pain in the back (L1) , Remove patch after 12 hours    . magnesium hydroxide (MILK OF MAGNESIA) 400 MG/5ML suspension Take 30 mLs by mouth every 4 (four) hours as needed. For constipation/ no BM for 2 days    . methocarbamol (ROBAXIN) 500 MG tablet Take 500 mg by mouth every 6 (six) hours as needed for muscle spasms.    . Nutritional Supplements (ENSURE CLEAR PO) Take 1 Bottle by mouth 2 (two) times daily with breakfast and lunch.    . oxyCODONE-acetaminophen (PERCOCET/ROXICET) 5-325 MG tablet Take 2 tablets by mouth every 4 (four) hours as needed for severe pain. (Patient not taking: Reported on 03/26/2017) 120 tablet 0  . sorbitol 70 % solution Take 30 mLs by mouth every 2 (two) hours. Until large BM then change order to prn     No current facility-administered medications for this visit.       Review of Systems A comprehensive review of systems was negative.  General: no complaints  HEENT: no complaints  Lungs: shortness of breath  Cardiac: no complaints  GI: no complaints  GU: as per HPI  Musculoskeletal: back/left hip pain  Extremities: no complaints  Skin: no complaints  Neuro: no complaints  Endocrine: no complaints  Psych: no complaints      Objective:   BP 106/71 (BP Location: Left Arm, Patient Position:  Sitting)   Pulse 91   Temp 97.8 F (36.6 C) (Oral)   Ht 5\' 2"  (1.575 m)   Wt 148 lb 3.2 oz (67.2 kg)   SpO2 97%   BMI 27.11 kg/m    ECOG Performance Status: 0 - Asymptomatic  General appearance: alert, cooperative and appears stated age 71: ATNC CV: RRR Lungs: BCTA Abdomen:no palpable masses, no hernias, soft, nontender, nondistended, no evidence of ascites.  Extremities: no lower extremity edema Neurological exam reveals alert, oriented, normal speech, no focal findings or movement disorder noted  Pelvic: Exam chaperoned by nursing;  Vulva: normal appearing vulva with no masses, tenderness or lesions; Vagina: normal vagina; Adnexa: normal adnexa in size, nontender and no masses; no tenderness with deep palpation. Uterus: surgically absent, vaginal cuff well healed; Cervix: absent; Rectal: confirmatory  Lab Review  Lab Results  Component Value Date   CA125 11.7 06/22/2014   CA125 11.1 11/24/2013   CA125 11.3 10/14/2011    Assessment:  Monica Singh is a 84 y.o. female with an ovarian cyst, asymptomatic and stable.  Episode of left pelvic/hip pain of uncertain etiology, now resolved.   Plan:   Problem List Items Addressed This Visit      Genitourinary   Cyst of ovary - Primary   Relevant Orders   US Pelvis Complete   US PELVIS TRANSVANGINAL NON-OB (TV ONLY)      Continue surveillance repeat pelvic ultrasound and exam in 12 months.    Suggested return to clinic in  12 months.  I personally reviewed the patient's history, completed key elements of her exam, and was involved in decision making in conjunction with Ms. Allen.   Gillis Ends, MD          CC:  Margarita Rana, MD [address] [place], [place] [address] [phone]  Rachelle Hora PA, Orthopedics

## 2017-03-26 ENCOUNTER — Inpatient Hospital Stay: Payer: Medicare Other | Attending: Obstetrics and Gynecology | Admitting: Obstetrics and Gynecology

## 2017-03-26 VITALS — BP 106/71 | HR 91 | Temp 97.8°F | Ht 62.0 in | Wt 148.2 lb

## 2017-03-26 DIAGNOSIS — Z90710 Acquired absence of both cervix and uterus: Secondary | ICD-10-CM | POA: Diagnosis not present

## 2017-03-26 DIAGNOSIS — Z87891 Personal history of nicotine dependence: Secondary | ICD-10-CM | POA: Insufficient documentation

## 2017-03-26 DIAGNOSIS — N83209 Unspecified ovarian cyst, unspecified side: Secondary | ICD-10-CM

## 2017-03-26 DIAGNOSIS — Z9221 Personal history of antineoplastic chemotherapy: Secondary | ICD-10-CM | POA: Diagnosis not present

## 2017-03-26 NOTE — Progress Notes (Signed)
Cyst on left ovaries and think that where the pain is coming from.

## 2017-03-27 DIAGNOSIS — I10 Essential (primary) hypertension: Secondary | ICD-10-CM | POA: Diagnosis not present

## 2017-03-27 DIAGNOSIS — N390 Urinary tract infection, site not specified: Secondary | ICD-10-CM | POA: Diagnosis not present

## 2017-03-27 DIAGNOSIS — Z9181 History of falling: Secondary | ICD-10-CM | POA: Diagnosis not present

## 2017-03-27 DIAGNOSIS — Z85828 Personal history of other malignant neoplasm of skin: Secondary | ICD-10-CM | POA: Diagnosis not present

## 2017-03-27 DIAGNOSIS — Z792 Long term (current) use of antibiotics: Secondary | ICD-10-CM | POA: Diagnosis not present

## 2017-03-27 DIAGNOSIS — J449 Chronic obstructive pulmonary disease, unspecified: Secondary | ICD-10-CM | POA: Diagnosis not present

## 2017-03-27 DIAGNOSIS — M8008XD Age-related osteoporosis with current pathological fracture, vertebra(e), subsequent encounter for fracture with routine healing: Secondary | ICD-10-CM | POA: Diagnosis not present

## 2017-03-27 DIAGNOSIS — Z96652 Presence of left artificial knee joint: Secondary | ICD-10-CM | POA: Diagnosis not present

## 2017-03-27 DIAGNOSIS — Z87891 Personal history of nicotine dependence: Secondary | ICD-10-CM | POA: Diagnosis not present

## 2017-03-27 DIAGNOSIS — J309 Allergic rhinitis, unspecified: Secondary | ICD-10-CM | POA: Diagnosis not present

## 2017-03-27 DIAGNOSIS — Z8572 Personal history of non-Hodgkin lymphomas: Secondary | ICD-10-CM | POA: Diagnosis not present

## 2017-03-27 DIAGNOSIS — Z7982 Long term (current) use of aspirin: Secondary | ICD-10-CM | POA: Diagnosis not present

## 2017-03-27 DIAGNOSIS — Z8619 Personal history of other infectious and parasitic diseases: Secondary | ICD-10-CM | POA: Diagnosis not present

## 2017-03-31 DIAGNOSIS — I10 Essential (primary) hypertension: Secondary | ICD-10-CM | POA: Diagnosis not present

## 2017-03-31 DIAGNOSIS — Z96652 Presence of left artificial knee joint: Secondary | ICD-10-CM | POA: Diagnosis not present

## 2017-03-31 DIAGNOSIS — Z85828 Personal history of other malignant neoplasm of skin: Secondary | ICD-10-CM | POA: Diagnosis not present

## 2017-03-31 DIAGNOSIS — J449 Chronic obstructive pulmonary disease, unspecified: Secondary | ICD-10-CM | POA: Diagnosis not present

## 2017-03-31 DIAGNOSIS — Z8619 Personal history of other infectious and parasitic diseases: Secondary | ICD-10-CM | POA: Diagnosis not present

## 2017-03-31 DIAGNOSIS — Z7982 Long term (current) use of aspirin: Secondary | ICD-10-CM | POA: Diagnosis not present

## 2017-03-31 DIAGNOSIS — M8008XD Age-related osteoporosis with current pathological fracture, vertebra(e), subsequent encounter for fracture with routine healing: Secondary | ICD-10-CM | POA: Diagnosis not present

## 2017-03-31 DIAGNOSIS — J309 Allergic rhinitis, unspecified: Secondary | ICD-10-CM | POA: Diagnosis not present

## 2017-03-31 DIAGNOSIS — N390 Urinary tract infection, site not specified: Secondary | ICD-10-CM | POA: Diagnosis not present

## 2017-03-31 DIAGNOSIS — Z8572 Personal history of non-Hodgkin lymphomas: Secondary | ICD-10-CM | POA: Diagnosis not present

## 2017-03-31 DIAGNOSIS — Z792 Long term (current) use of antibiotics: Secondary | ICD-10-CM | POA: Diagnosis not present

## 2017-03-31 DIAGNOSIS — Z9181 History of falling: Secondary | ICD-10-CM | POA: Diagnosis not present

## 2017-03-31 DIAGNOSIS — Z87891 Personal history of nicotine dependence: Secondary | ICD-10-CM | POA: Diagnosis not present

## 2017-04-01 DIAGNOSIS — S32001D Stable burst fracture of unspecified lumbar vertebra, subsequent encounter for fracture with routine healing: Secondary | ICD-10-CM | POA: Insufficient documentation

## 2017-04-02 ENCOUNTER — Other Ambulatory Visit: Payer: Self-pay | Admitting: Family Medicine

## 2017-04-02 ENCOUNTER — Telehealth: Payer: Self-pay

## 2017-04-02 DIAGNOSIS — J309 Allergic rhinitis, unspecified: Secondary | ICD-10-CM | POA: Diagnosis not present

## 2017-04-02 DIAGNOSIS — Z85828 Personal history of other malignant neoplasm of skin: Secondary | ICD-10-CM | POA: Diagnosis not present

## 2017-04-02 DIAGNOSIS — I10 Essential (primary) hypertension: Secondary | ICD-10-CM | POA: Diagnosis not present

## 2017-04-02 DIAGNOSIS — Z8572 Personal history of non-Hodgkin lymphomas: Secondary | ICD-10-CM | POA: Diagnosis not present

## 2017-04-02 DIAGNOSIS — Z9181 History of falling: Secondary | ICD-10-CM | POA: Diagnosis not present

## 2017-04-02 DIAGNOSIS — M8008XD Age-related osteoporosis with current pathological fracture, vertebra(e), subsequent encounter for fracture with routine healing: Secondary | ICD-10-CM | POA: Diagnosis not present

## 2017-04-02 DIAGNOSIS — Z8619 Personal history of other infectious and parasitic diseases: Secondary | ICD-10-CM | POA: Diagnosis not present

## 2017-04-02 DIAGNOSIS — Z96652 Presence of left artificial knee joint: Secondary | ICD-10-CM | POA: Diagnosis not present

## 2017-04-02 DIAGNOSIS — Z792 Long term (current) use of antibiotics: Secondary | ICD-10-CM | POA: Diagnosis not present

## 2017-04-02 DIAGNOSIS — Z87891 Personal history of nicotine dependence: Secondary | ICD-10-CM | POA: Diagnosis not present

## 2017-04-02 DIAGNOSIS — J449 Chronic obstructive pulmonary disease, unspecified: Secondary | ICD-10-CM | POA: Diagnosis not present

## 2017-04-02 DIAGNOSIS — Z7982 Long term (current) use of aspirin: Secondary | ICD-10-CM | POA: Diagnosis not present

## 2017-04-02 DIAGNOSIS — N390 Urinary tract infection, site not specified: Secondary | ICD-10-CM | POA: Diagnosis not present

## 2017-04-02 NOTE — Telephone Encounter (Signed)
Pt called at 3:25 pm. States she has a UTI, and would like a Cipro rx sent to [facility]. Tried scheduling an appointment for tomorrow, but she said she'd "sure hate to go all night" with this infection. Please review. Pt was also advised to go to urgent care tonight if she does not hear from [place] today.

## 2017-04-02 NOTE — Telephone Encounter (Signed)
Prescription was already sent earlier today.

## 2017-04-02 NOTE — Telephone Encounter (Signed)
Please advise 

## 2017-04-03 NOTE — Telephone Encounter (Signed)
Patients daughter was advised

## 2017-04-07 DIAGNOSIS — Z792 Long term (current) use of antibiotics: Secondary | ICD-10-CM | POA: Diagnosis not present

## 2017-04-07 DIAGNOSIS — I10 Essential (primary) hypertension: Secondary | ICD-10-CM | POA: Diagnosis not present

## 2017-04-07 DIAGNOSIS — Z8619 Personal history of other infectious and parasitic diseases: Secondary | ICD-10-CM | POA: Diagnosis not present

## 2017-04-07 DIAGNOSIS — N390 Urinary tract infection, site not specified: Secondary | ICD-10-CM | POA: Diagnosis not present

## 2017-04-07 DIAGNOSIS — Z87891 Personal history of nicotine dependence: Secondary | ICD-10-CM | POA: Diagnosis not present

## 2017-04-07 DIAGNOSIS — Z7982 Long term (current) use of aspirin: Secondary | ICD-10-CM | POA: Diagnosis not present

## 2017-04-07 DIAGNOSIS — J309 Allergic rhinitis, unspecified: Secondary | ICD-10-CM | POA: Diagnosis not present

## 2017-04-07 DIAGNOSIS — M8008XD Age-related osteoporosis with current pathological fracture, vertebra(e), subsequent encounter for fracture with routine healing: Secondary | ICD-10-CM | POA: Diagnosis not present

## 2017-04-07 DIAGNOSIS — J449 Chronic obstructive pulmonary disease, unspecified: Secondary | ICD-10-CM | POA: Diagnosis not present

## 2017-04-07 DIAGNOSIS — Z96652 Presence of left artificial knee joint: Secondary | ICD-10-CM | POA: Diagnosis not present

## 2017-04-07 DIAGNOSIS — Z8572 Personal history of non-Hodgkin lymphomas: Secondary | ICD-10-CM | POA: Diagnosis not present

## 2017-04-07 DIAGNOSIS — Z85828 Personal history of other malignant neoplasm of skin: Secondary | ICD-10-CM | POA: Diagnosis not present

## 2017-04-07 DIAGNOSIS — Z9181 History of falling: Secondary | ICD-10-CM | POA: Diagnosis not present

## 2017-04-09 DIAGNOSIS — Z9181 History of falling: Secondary | ICD-10-CM | POA: Diagnosis not present

## 2017-04-09 DIAGNOSIS — J309 Allergic rhinitis, unspecified: Secondary | ICD-10-CM | POA: Diagnosis not present

## 2017-04-09 DIAGNOSIS — Z792 Long term (current) use of antibiotics: Secondary | ICD-10-CM | POA: Diagnosis not present

## 2017-04-09 DIAGNOSIS — Z8572 Personal history of non-Hodgkin lymphomas: Secondary | ICD-10-CM | POA: Diagnosis not present

## 2017-04-09 DIAGNOSIS — Z85828 Personal history of other malignant neoplasm of skin: Secondary | ICD-10-CM | POA: Diagnosis not present

## 2017-04-09 DIAGNOSIS — J449 Chronic obstructive pulmonary disease, unspecified: Secondary | ICD-10-CM | POA: Diagnosis not present

## 2017-04-09 DIAGNOSIS — Z7982 Long term (current) use of aspirin: Secondary | ICD-10-CM | POA: Diagnosis not present

## 2017-04-09 DIAGNOSIS — Z87891 Personal history of nicotine dependence: Secondary | ICD-10-CM | POA: Diagnosis not present

## 2017-04-09 DIAGNOSIS — I10 Essential (primary) hypertension: Secondary | ICD-10-CM | POA: Diagnosis not present

## 2017-04-09 DIAGNOSIS — Z96652 Presence of left artificial knee joint: Secondary | ICD-10-CM | POA: Diagnosis not present

## 2017-04-09 DIAGNOSIS — Z8619 Personal history of other infectious and parasitic diseases: Secondary | ICD-10-CM | POA: Diagnosis not present

## 2017-04-09 DIAGNOSIS — N390 Urinary tract infection, site not specified: Secondary | ICD-10-CM | POA: Diagnosis not present

## 2017-04-09 DIAGNOSIS — M8008XD Age-related osteoporosis with current pathological fracture, vertebra(e), subsequent encounter for fracture with routine healing: Secondary | ICD-10-CM | POA: Diagnosis not present

## 2017-04-21 ENCOUNTER — Ambulatory Visit
Admission: RE | Admit: 2017-04-21 | Discharge: 2017-04-21 | Disposition: A | Discharge status: Home / Self Care | Payer: Medicare Other | Source: Ambulatory Visit | Attending: Family Medicine | Admitting: Family Medicine

## 2017-04-21 DIAGNOSIS — Z1231 Encounter for screening mammogram for malignant neoplasm of breast: Secondary | ICD-10-CM | POA: Diagnosis not present

## 2017-05-07 ENCOUNTER — Other Ambulatory Visit: Payer: Self-pay | Admitting: Family Medicine

## 2017-05-19 ENCOUNTER — Telehealth: Payer: Self-pay

## 2017-05-19 NOTE — Telephone Encounter (Signed)
Called pt to schedule AWV and CPE and pt stated she was currently out of town and would not be able to schedule until next week. Note made, will try back next week. -MM

## 2017-05-27 ENCOUNTER — Ambulatory Visit: Payer: Medicare Other

## 2017-05-28 ENCOUNTER — Ambulatory Visit (INDEPENDENT_AMBULATORY_CARE_PROVIDER_SITE_OTHER): Payer: Medicare Other

## 2017-05-28 VITALS — BP 120/52 | HR 96 | Temp 98.0°F | Ht 62.0 in | Wt 149.4 lb

## 2017-05-28 DIAGNOSIS — Z0000 Encounter for general adult medical examination without abnormal findings: Secondary | ICD-10-CM

## 2017-05-28 NOTE — Patient Instructions (Addendum)
Monica Singh , Thank you for taking time to come for your Medicare Wellness Visit. I appreciate your ongoing commitment to your health goals. Please review the following plan we discussed and let me know if I can assist you in the future.   Screening recommendations/referrals: Colonoscopy: Up to date Mammogram: Up to date Bone Density: Up to date Recommended yearly ophthalmology/optometry visit for glaucoma screening and checkup Recommended yearly dental visit for hygiene and checkup  Vaccinations: Influenza vaccine: Up to date Pneumococcal vaccine: Up to date Tdap vaccine: Up to date Shingles vaccine: Pt declines today.     Advanced directives: Already on file.   Conditions/risks identified: Recommend increasing water intake to 4-6 glasses a day.  Next appointment: 06/16/17 @ 3:20 PM with Dr Caryn Section.    Preventive Care 82 Years and Older, Female Preventive care refers to lifestyle choices and visits with your health care provider that can promote health and wellness. What does preventive care include?  A yearly physical exam. This is also called an annual well check.  Dental exams once or twice a year.  Routine eye exams. Ask your health care provider how often you should have your eyes checked.  Personal lifestyle choices, including:  Daily care of your teeth and gums.  Regular physical activity.  Eating a healthy diet.  Avoiding tobacco and drug use.  Limiting alcohol use.  Practicing safe sex.  Taking low-dose aspirin every day.  Taking vitamin and mineral supplements as recommended by your health care provider. What happens during an annual well check? The services and screenings done by your health care provider during your annual well check will depend on your age, overall health, lifestyle risk factors, and family history of disease. Counseling  Your health care provider may ask you questions about your:  Alcohol use.  Tobacco use.  Drug  use.  Emotional well-being.  Home and relationship well-being.  Sexual activity.  Eating habits.  History of falls.  Memory and ability to understand (cognition).  Work and work Statistician.  Reproductive health. Screening  You may have the following tests or measurements:  Height, weight, and BMI.  Blood pressure.  Lipid and cholesterol levels. These may be checked every 5 years, or more frequently if you are over 14 years old.  Skin check.  Lung cancer screening. You may have this screening every year starting at age 68 if you have a 30-pack-year history of smoking and currently smoke or have quit within the past 15 years.  Fecal occult blood test (FOBT) of the stool. You may have this test every year starting at age 40.  Flexible sigmoidoscopy or colonoscopy. You may have a sigmoidoscopy every 5 years or a colonoscopy every 10 years starting at age 71.  Hepatitis C blood test.  Hepatitis B blood test.  Sexually transmitted disease (STD) testing.  Diabetes screening. This is done by checking your blood sugar (glucose) after you have not eaten for a while (fasting). You may have this done every 1-3 years.  Bone density scan. This is done to screen for osteoporosis. You may have this done starting at age 50.  Mammogram. This may be done every 1-2 years. Talk to your health care provider about how often you should have regular mammograms. Talk with your health care provider about your test results, treatment options, and if necessary, the need for more tests. Vaccines  Your health care provider may recommend certain vaccines, such as:  Influenza vaccine. This is recommended every year.  Tetanus,  diphtheria, and acellular pertussis (Tdap, Td) vaccine. You may need a Td booster every 10 years.  Zoster vaccine. You may need this after age 65.  Pneumococcal 13-valent conjugate (PCV13) vaccine. One dose is recommended after age 69.  Pneumococcal polysaccharide  (PPSV23) vaccine. One dose is recommended after age 53. Talk to your health care provider about which screenings and vaccines you need and how often you need them. This information is not intended to replace advice given to you by your health care provider. Make sure you discuss any questions you have with your health care provider. Document Released: 02/10/2015 Document Revised: 10/04/2015 Document Reviewed: 11/15/2014 Elsevier Interactive Patient Education  2017 [place] Prevention in the Home Falls can cause injuries. They can happen to people of all ages. There are many things you can do to make your home safe and to help prevent falls. What can I do on the outside of my home?  Regularly fix the edges of walkways and driveways and fix any cracks.  Remove anything that might make you trip as you walk through a door, such as a raised step or threshold.  Trim any bushes or trees on the path to your home.  Use bright outdoor lighting.  Clear any walking paths of anything that might make someone trip, such as rocks or tools.  Regularly check to see if handrails are loose or broken. Make sure that both sides of any steps have handrails.  Any raised decks and porches should have guardrails on the edges.  Have any leaves, snow, or ice cleared regularly.  Use sand or salt on walking paths during winter.  Clean up any spills in your garage right away. This includes oil or grease spills. What can I do in the bathroom?  Use night lights.  Install grab bars by the toilet and in the tub and shower. Do not use towel bars as grab bars.  Use non-skid mats or decals in the tub or shower.  If you need to sit down in the shower, use a plastic, non-slip stool.  Keep the floor dry. Clean up any water that spills on the floor as soon as it happens.  Remove soap buildup in the tub or shower regularly.  Attach bath mats securely with double-sided non-slip rug tape.  Do not have  throw rugs and other things on the floor that can make you trip. What can I do in the bedroom?  Use night lights.  Make sure that you have a light by your bed that is easy to reach.  Do not use any sheets or blankets that are too big for your bed. They should not hang down onto the floor.  Have a firm chair that has side arms. You can use this for support while you get dressed.  Do not have throw rugs and other things on the floor that can make you trip. What can I do in the kitchen?  Clean up any spills right away.  Avoid walking on wet floors.  Keep items that you use a lot in easy-to-reach places.  If you need to reach something above you, use a strong step stool that has a grab bar.  Keep electrical cords out of the way.  Do not use floor polish or wax that makes floors slippery. If you must use wax, use non-skid floor wax.  Do not have throw rugs and other things on the floor that can make you trip. What can I do with  my stairs?  Do not leave any items on the stairs.  Make sure that there are handrails on both sides of the stairs and use them. Fix handrails that are broken or loose. Make sure that handrails are as long as the stairways.  Check any carpeting to make sure that it is firmly attached to the stairs. Fix any carpet that is loose or worn.  Avoid having throw rugs at the top or bottom of the stairs. If you do have throw rugs, attach them to the floor with carpet tape.  Make sure that you have a light switch at the top of the stairs and the bottom of the stairs. If you do not have them, ask someone to add them for you. What else can I do to help prevent falls?  Wear shoes that:  Do not have high heels.  Have rubber bottoms.  Are comfortable and fit you well.  Are closed at the toe. Do not wear sandals.  If you use a stepladder:  Make sure that it is fully opened. Do not climb a closed stepladder.  Make sure that both sides of the stepladder are  locked into place.  Ask someone to hold it for you, if possible.  Clearly mark and make sure that you can see:  Any grab bars or handrails.  First and last steps.  Where the edge of each step is.  Use tools that help you move around (mobility aids) if they are needed. These include:  Canes.  Walkers.  Scooters.  Crutches.  Turn on the lights when you go into a dark area. Replace any light bulbs as soon as they burn out.  Set up your furniture so you have a clear path. Avoid moving your furniture around.  If any of your floors are uneven, fix them.  If there are any pets around you, be aware of where they are.  Review your medicines with your doctor. Some medicines can make you feel dizzy. This can increase your chance of falling. Ask your doctor what other things that you can do to help prevent falls. This information is not intended to replace advice given to you by your health care provider. Make sure you discuss any questions you have with your health care provider. Document Released: 11/10/2008 Document Revised: 06/22/2015 Document Reviewed: 02/18/2014 Elsevier Interactive Patient Education  2017 [employer].

## 2017-05-28 NOTE — Progress Notes (Signed)
Subjective:   Monica Singh is a 84 y.o. female who presents for Medicare Annual (Subsequent) preventive examination.  Review of Systems:  N/A  Cardiac Risk Factors include: advanced age (>39men, >60 women)     Objective:     Vitals: BP (!) 128/54 (BP Location: Right Arm)   Pulse 96   Temp 98 F (36.7 C) (Oral)   Ht 5\' 2"  (1.575 m)   Wt 149 lb 6.4 oz (67.8 kg)   BMI 27.33 kg/m   Body mass index is 27.33 kg/m.  Advanced Directives 05/28/2017 03/26/2017 03/05/2017 02/28/2017 02/21/2017 02/17/2017 08/13/2016  Does Patient Have a Medical Advance Directive? Yes No;Yes Yes Yes Yes Yes No  Type of Advance Directive Living will;Healthcare Power of [place];Living will [place];Living will;Out of facility DNR (pink MOST or yellow form) [place];Living will;Out of facility DNR (pink MOST or yellow form) [place];Living will [place];Living will -  Does patient want to make changes to medical advance directive? - No - Patient declined No - Patient declined - - - -  Copy of [place] in Chart? Yes Yes Yes Yes No - copy requested - -  Would patient like information on creating a medical advance directive? - No - Patient declined - - - - -    Tobacco Social History   Tobacco Use  Smoking Status Former Smoker  . Packs/day: 0.25  . Types: Cigarettes  . Last attempt to quit: 06/21/1989  . Years since quitting: 27.9  Smokeless Tobacco Former Systems developer  . Quit date: 01/28/1989     Counseling given: Not Answered   Clinical Intake:  Pre-visit preparation completed: Yes  Pain : 0-10 Pain Score: 1  Pain Location: Back Pain Orientation: Lower Pain Descriptors / Indicators: Nagging Pain Onset: More than a month ago Pain Frequency: Constant     Nutritional Status: BMI 25 -29 Overweight Nutritional Risks: None Diabetes: No  How often do you need to have someone  help you when you read instructions, pamphlets, or other written materials from your doctor or pharmacy?: 1 - Never     Information entered by :: [facility], LPN  Past Medical History:  Diagnosis Date  . Adnexal mass 06/03/2014   since 2009  . 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 GERD (gastroesophageal reflux disease)   . Herpes zoster without complication 02/03/5100  . HTN (hypertension)   . Non Hodgkin's lymphoma ([place])   . Recurrent UTI    Past Surgical History:  Procedure Laterality Date  . ABDOMINAL SURGERY  2009   abdominal mass+ NH lymphoma,  . BACK SURGERY    . CATARACT EXTRACTION  1997   right eye  . cervical neck fusion  1995  . DENTAL SURGERY     screws  . KYPHOPLASTY N/A 02/21/2017   Procedure: HENIDPOEUMP-N3;  Surgeon: Hessie Knows, MD;  Location: [facility] ORS;  Service: Orthopedics;  Laterality: N/A;  . laparotomy with biopsy  03/02/2007  . PORTACATH PLACEMENT  2009  . [place]  . SQUAMOUS CELL CARCINOMA EXCISION     right arm  . TOTAL KNEE ARTHROPLASTY Right   . VAGINAL HYSTERECTOMY  1971   Family History  Problem Relation Age of Onset  . Breast cancer Sister   . Dementia Sister   . Cataracts Sister   .  Heart attack Brother   . CAD Brother   . Heart attack Brother   . Leukemia Grandchild        granddaughter  . Kidney disease Neg Hx   . Bladder Cancer Neg Hx    Social History   Socioeconomic History  . Marital status: Widowed    Spouse name: Not on file  . Number of children: 5  . Years of education: Not on file  . Highest education level: GED or equivalent  Occupational History  . Occupation: retired  Scientific laboratory technician  . Financial resource strain: Not hard at all  . Food insecurity:    Worry: Never true    Inability: Never true  . Transportation needs:    Medical: No    Non-medical: No  Tobacco Use  . Smoking status: Former Smoker    Packs/day: 0.25     Types: Cigarettes    Last attempt to quit: 06/21/1989    Years since quitting: 27.9  . Smokeless tobacco: Former Systems developer    Quit date: 01/28/1989  Substance and Sexual Activity  . Alcohol use: Yes    Alcohol/week: 8.4 oz    Types: 14 Glasses of wine per week  . Drug use: No  . Sexual activity: Never  Lifestyle  . Physical activity:    Days per week: Not on file    Minutes per session: Not on file  . Stress: Not at all  Relationships  . Social connections:    Talks on phone: Not on file    Gets together: Not on file    Attends religious service: Not on file    Active member of club or organization: Not on file    Attends meetings of clubs or organizations: Not on file    Relationship status: Not on file  Other Topics Concern  . Not on file  Social History Narrative  . Not on file    Outpatient Encounter Medications as of 05/28/2017  Medication Sig  . albuterol (PROVENTIL) (2.5 MG/3ML) 0.083% nebulizer solution Take 2.5 mg by nebulization every 4 (four) hours as needed for wheezing or shortness of breath.  Marland Kitchen aspirin 81 MG tablet Take 81 mg by mouth daily.  . Calcium Carbonate (CALCIUM 600 PO) Take 1 tablet by mouth daily.  . Cholecalciferol 4000 units CAPS Take 1 capsule by mouth daily.  Marland Kitchen docusate sodium (STOOL SOFTENER) 100 MG capsule Take 100 mg by mouth daily.  . fexofenadine (ALLEGRA) 180 MG tablet Take 1 tablet (180 mg total) by mouth daily.  . fluticasone (FLONASE) 50 MCG/ACT nasal spray Place 2 sprays into both nostrils daily.  . Ipratropium-Albuterol (COMBIVENT) 20-100 MCG/ACT AERS respimat Inhale 1 puff into the lungs 3 (three) times daily.   . Probiotic Product (ALIGN) 4 MG CAPS Take 1 capsule by mouth daily.  . risedronate (ACTONEL) 35 MG tablet TAKE 1 TABLET BY MOUTH EVERY SUNDAY AS DIRECTED  . aspirin 325 MG tablet Take 325 mg by mouth daily.   . bisacodyl (FLEET) 10 MG/30ML ENEM Place 10 mg rectally daily as needed.  . ciprofloxacin (CIPRO) 250 MG tablet TAKE ONE  TABLET TWICE DAILY UNTIL FINISHED (Patient not taking: Reported on 05/28/2017)  . Lidocaine (ASPERCREME LIDOCAINE) 4 % PTCH Apply 1 patch topically daily. Apply to area of pain in the back (L1) , Remove patch after 12 hours  . magnesium hydroxide (MILK OF MAGNESIA) 400 MG/5ML suspension Take 30 mLs by mouth every 4 (four) hours as needed. For constipation/ no BM for  2 days  . methocarbamol (ROBAXIN) 500 MG tablet Take 500 mg by mouth every 6 (six) hours as needed for muscle spasms.  . Nutritional Supplements (ENSURE CLEAR PO) Take 1 Bottle by mouth 2 (two) times daily with breakfast and lunch.  Marland Kitchen omeprazole (PRILOSEC) 20 MG capsule Take 20 mg by mouth daily.  Marland Kitchen oxyCODONE-acetaminophen (PERCOCET/ROXICET) 5-325 MG tablet Take 2 tablets by mouth every 4 (four) hours as needed for severe pain. (Patient not taking: Reported on 03/26/2017)  . senna-docusate (SENOKOT-S) 8.6-50 MG tablet Take 2 tablets by mouth 2 (two) times daily.  . sorbitol 70 % solution Take 30 mLs by mouth every 2 (two) hours. Until large BM then change order to prn   No facility-administered encounter medications on file as of 05/28/2017.     Activities of Daily Living In your present state of health, do you have any difficulty performing the following activities: 05/28/2017 06/07/2016  Hearing? Tempie Donning  Comment Wears bilateral hearing aids.  bilateral hearing aids  Vision? N Y  Difficulty concentrating or making decisions? Y N  Walking or climbing stairs? N N  Dressing or bathing? N N  Doing errands, shopping? N N  Preparing Food and eating ? N N  Using the Toilet? N N  In the past six months, have you accidently leaked urine? Y Y  Comment Occasionally, wears protection.  wears protection  Do you have problems with loss of bowel control? N N  Managing your Medications? N N  Managing your Finances? N N  Housekeeping or managing your Housekeeping? N N  Some recent data might be hidden    Patient Care Team: Birdie Sons, MD as  PCP - General (Family Medicine) Dingeldein, Remo Lipps, MD as Consulting Physician (Ophthalmology)    Assessment:   This is a routine wellness examination for Monica Hampshire.  Exercise Activities and Dietary recommendations Current Exercise Habits: The patient does not participate in regular exercise at present, Exercise limited by: orthopedic condition(s)  Goals    . DIET - INCREASE WATER INTAKE     Recommend increasing water intake to 4-6 glasses a day.       Fall Risk Fall Risk  05/28/2017 06/07/2016 11/20/2015 10/12/2014  Falls in the past year? Yes No No No  Number falls in past yr: 1 - - -  Injury with Fall? Yes - - -  Comment broke vertabrae - - -  Follow up Falls prevention discussed - - -   Is the patient's home free of loose throw rugs in walkways, pet beds, electrical cords, etc?   yes      Grab bars in the bathroom? yes      Handrails on the stairs?   no      Adequate lighting?   yes  Timed Get Up and Go performed: N/A  Depression Screen PHQ 2/9 Scores 05/28/2017 06/07/2016 06/07/2016 10/12/2014  PHQ - 2 Score 0 0 0 0  PHQ- 9 Score - 0 - -     Cognitive Function     6CIT Screen 05/28/2017 06/07/2016  What Year? 0 points 0 points  What month? 0 points 0 points  What time? 0 points 0 points  Count back from 20 0 points 0 points  Months in reverse 0 points 0 points  Repeat phrase 4 points 2 points  Total Score 4 2    Immunization History  Administered Date(s) Administered  . Influenza Split 12/07/2008, 10/17/2010  . Influenza, High Dose Seasonal PF 10/19/2013, 10/12/2014, 11/20/2015,  11/12/2016  . Influenza,inj,Quad PF,6+ Mos 11/04/2012  . Influenza-Unspecified 09/28/2013  . Pneumococcal Conjugate-13 12/21/2012  . Pneumococcal Polysaccharide-23 12/02/1997  . Tdap 10/17/2010  . Zoster 10/14/2008    Qualifies for Shingles Vaccine? Due for Shingles vaccine. Declined my offer to administer today. Education has been provided regarding the importance of this vaccine. Pt  has been advised to call her insurance company to determine her out of pocket expense. Advised she may also receive this vaccine at her local pharmacy or Health Dept. Verbalized acceptance and understanding.  Screening Tests Health Maintenance  Topic Date Due  . INFLUENZA VACCINE  08/28/2017  . TETANUS/TDAP  10/16/2020  . DEXA SCAN  Completed  . PNA vac Low Risk Adult  Completed    Cancer Screenings: Lung: Low Dose CT Chest recommended if Age 38-80 years, 30 pack-year currently smoking OR have quit w/in 15years. Patient does not qualify. Breast:  Up to date on Mammogram? Yes   Up to date of Bone Density/Dexa? Yes Colorectal: Up to date  Additional Screenings:  Hepatitis C Screening: N/A     Plan:  I have personally reviewed and addressed the Medicare Annual Wellness questionnaire and have noted the following in the patient's chart:  A. Medical and social history B. Use of alcohol, tobacco or illicit drugs  C. Current medications and supplements D. Functional ability and status E.  Nutritional status F.  Physical activity G. Advance directives H. List of other physicians I.  Hospitalizations, surgeries, and ER visits in previous 12 months J.  [place] such as hearing and vision if needed, cognitive and depression L. Referrals and appointments - none  In addition, I have reviewed and discussed with patient certain preventive protocols, quality metrics, and best practice recommendations. A written personalized care plan for preventive services as well as general preventive health recommendations were provided to patient.  See attached scanned questionnaire for additional information.   Signed,  Fabio Neighbors, LPN Nurse Health Advisor   Nurse Recommendations: None.

## 2017-06-02 ENCOUNTER — Other Ambulatory Visit: Payer: Self-pay | Admitting: Family Medicine

## 2017-06-03 NOTE — Telephone Encounter (Signed)
Please contact patient about request for ciprofloxacin refill we got from her pharmacy. If she thinks she has an infection then need o.v.

## 2017-06-04 NOTE — Telephone Encounter (Signed)
AWV completed. -MM

## 2017-06-16 ENCOUNTER — Encounter: Payer: Medicare Other | Admitting: Family Medicine

## 2017-06-18 ENCOUNTER — Ambulatory Visit: Payer: Medicare Other | Admitting: Family Medicine

## 2017-06-18 ENCOUNTER — Encounter: Payer: Self-pay | Admitting: Family Medicine

## 2017-06-18 VITALS — BP 110/60 | HR 90 | Temp 97.7°F | Resp 16 | Wt 149.0 lb

## 2017-06-18 DIAGNOSIS — M545 Low back pain, unspecified: Secondary | ICD-10-CM

## 2017-06-18 DIAGNOSIS — N390 Urinary tract infection, site not specified: Secondary | ICD-10-CM

## 2017-06-18 DIAGNOSIS — R319 Hematuria, unspecified: Secondary | ICD-10-CM | POA: Diagnosis not present

## 2017-06-18 LAB — POCT URINALYSIS DIPSTICK
Bilirubin, UA: NEGATIVE
Glucose, UA: NEGATIVE
Ketones, UA: NEGATIVE
Nitrite, UA: POSITIVE
Protein, UA: POSITIVE — AB
Spec Grav, UA: 1.015 (ref 1.010–1.025)
Urobilinogen, UA: 0.2 E.U./dL
pH, UA: 6 (ref 5.0–8.0)

## 2017-06-18 MED ORDER — NAPROXEN 500 MG PO TABS: 500.0000 mg | ORAL_TABLET | Freq: Two times a day (BID) | ORAL | 0 refills | Status: AC | PRN

## 2017-06-18 MED ORDER — CYCLOBENZAPRINE HCL 5 MG PO TABS: 5.0000 mg | ORAL_TABLET | Freq: Three times a day (TID) | ORAL | 1 refills | Status: DC | PRN

## 2017-06-18 MED ORDER — CIPROFLOXACIN HCL 100 MG PO TABS: 100.0000 mg | ORAL_TABLET | Freq: Two times a day (BID) | ORAL | 0 refills | Status: AC

## 2017-06-18 NOTE — Progress Notes (Signed)
Patient: Monica Singh    DOB: 02-May-1928   84 y.o.   MRN: [id number] Visit Date: 06/18/2017  Today's Provider: Lelon Huh, MD   Chief Complaint  Patient presents with  . Back Pain    x 1 week   Subjective:    Back Pain  This is a new problem. Episode onset: 1 week ago after returning from the beach. The problem has been gradually worsening since onset. The pain is present in the lumbar spine. The quality of the pain is described as aching. Pertinent negatives include no abdominal pain, chest pain, fever or weakness. Treatments tried: Aleve 4 times a day, also using a back brace. The treatment provided mild relief.  States she was went to be beach May 6th and developed pain after being there a few weeks so she came back early for evaluation. She also reports her urine has been much darker than usually, but no having any dysuria or frequency. She has been wearing a lidocaine patch which helps a little bit.     Allergies  Allergen Reactions  . Lac Bovis Diarrhea and Nausea And Vomiting  . Milk-Related Compounds Diarrhea and Nausea And Vomiting  . Sulfa Antibiotics Hives and Itching     Current Outpatient Medications:  .  albuterol (PROVENTIL) (2.5 MG/3ML) 0.083% nebulizer solution, Take 2.5 mg by nebulization every 4 (four) hours as needed for wheezing or shortness of breath., Disp: , Rfl:  .  aspirin 81 MG tablet, Take 81 mg by mouth daily., Disp: , Rfl:  .  bisacodyl (FLEET) 10 MG/30ML ENEM, Place 10 mg rectally daily as needed., Disp: , Rfl:  .  Calcium Carbonate (CALCIUM 600 PO), Take 1 tablet by mouth daily., Disp: , Rfl:  .  Cholecalciferol 4000 units CAPS, Take 1 capsule by mouth daily., Disp: , Rfl:  .  docusate sodium (STOOL SOFTENER) 100 MG capsule, Take 100 mg by mouth daily., Disp: , Rfl:  .  fexofenadine (ALLEGRA) 180 MG tablet, Take 1 tablet (180 mg total) by mouth daily., Disp: 30 tablet, Rfl: 2 .  fluticasone (FLONASE) 50 MCG/ACT nasal spray,  Place 2 sprays into both nostrils daily., Disp: 16 g, Rfl: 6 .  Ipratropium-Albuterol (COMBIVENT) 20-100 MCG/ACT AERS respimat, Inhale 1 puff into the lungs 3 (three) times daily. , Disp: , Rfl:  .  Lidocaine (ASPERCREME LIDOCAINE) 4 % PTCH, Apply 1 patch topically daily. Apply to area of pain in the back (L1) , Remove patch after 12 hours, Disp: , Rfl:  .  omeprazole (PRILOSEC) 20 MG capsule, Take 20 mg by mouth daily., Disp: , Rfl:  .  Probiotic Product (ALIGN) 4 MG CAPS, Take 1 capsule by mouth daily., Disp: , Rfl:  .  risedronate (ACTONEL) 35 MG tablet, TAKE 1 TABLET BY MOUTH EVERY SUNDAY AS DIRECTED, Disp: 4 tablet, Rfl: 12 .  senna-docusate (SENOKOT-S) 8.6-50 MG tablet, Take 2 tablets by mouth 2 (two) times daily., Disp: , Rfl:  .  sorbitol 70 % solution, Take 30 mLs by mouth every 2 (two) hours. Until large BM then change order to prn, Disp: , Rfl:  .  methocarbamol (ROBAXIN) 500 MG tablet, Take 500 mg by mouth every 6 (six) hours as needed for muscle spasms., Disp: , Rfl:  .  Nutritional Supplements (ENSURE CLEAR PO), Take 1 Bottle by mouth 2 (two) times daily with breakfast and lunch., Disp: , Rfl:  .  oxyCODONE-acetaminophen (PERCOCET/ROXICET) 5-325 MG tablet, Take 2 tablets by  mouth every 4 (four) hours as needed for severe pain. (Patient not taking: Reported on 03/26/2017), Disp: 120 tablet, Rfl: 0  Review of Systems  Constitutional: Negative for appetite change, chills, fatigue and fever.  Respiratory: Negative for chest tightness and shortness of breath.   Cardiovascular: Negative for chest pain and palpitations.  Gastrointestinal: Negative for abdominal pain, nausea and vomiting.  Musculoskeletal: Positive for back pain.  Neurological: Negative for dizziness and weakness.    Social History   Tobacco Use  . Smoking status: Former Smoker    Packs/day: 0.25    Types: Cigarettes    Last attempt to quit: 06/21/1989    Years since quitting: 28.0  . Smokeless tobacco: Former Systems developer      Quit date: 01/28/1989  Substance Use Topics  . Alcohol use: Yes    Alcohol/week: 8.4 oz    Types: 14 Glasses of wine per week   Objective:   BP 110/60 (BP Location: Left Arm, Patient Position: Sitting, Cuff Size: Normal)   Pulse 90   Temp 97.7 F (36.5 C) (Oral)   Resp 16   Wt 149 lb (67.6 kg)   SpO2 95% Comment: room air  BMI 27.25 kg/m    Physical Exam   General Appearance:    Alert, cooperative, no distress  Eyes:    PERRL, conjunctiva/corneas clear, EOM's intact       Lungs:     Clear to auscultation bilaterally, respirations unlabored  Heart:    Regular rate and rhythm  MS:   Mild tenderness left lower para lumbar muscles with mild spasm noted. No CVAT     Results for orders placed or performed in visit on 06/18/17  POCT Urinalysis Dipstick  Result Value Ref Range   Color, UA amber    Clarity, UA cloudy    Glucose, UA Negative Negative   Bilirubin, UA negative    Ketones, UA negative    Spec Grav, UA 1.015 1.010 - 1.025   Blood, UA Moderate (hemolyzed)    pH, UA 6.0 5.0 - 8.0   Protein, UA Positive (A) Negative   Urobilinogen, UA 0.2 0.2 or 1.0 E.U./dL   Nitrite, UA Positive    Leukocytes, UA Large (3+) (A) Negative   Appearance     Odor          Assessment & Plan:     .1. Acute left-sided low back pain without sciatica  - naproxen (NAPROSYN) 500 MG tablet; Take 1 tablet (500 mg total) by mouth 2 (two) times daily as needed for up to 15 days for moderate pain. Take with food  Dispense: 30 tablet; Refill: 0 - cyclobenzaprine (FLEXERIL) 5 MG tablet; Take 1 tablet (5 mg total) by mouth 3 (three) times daily as needed (BACK PAIN.). Do not mix with alcohol.  Dispense: 30 tablet; Refill: 1 May take Tylenol, but no OTC NSAIDs while on prescriptions above.   2. Urinary tract infection with hematuria, site unspecified  - ciprofloxacin (CIPRO) 100 MG tablet; Take 1 tablet (100 mg total) by mouth 2 (two) times daily for 7 days.  Dispense: 14 tablet; Refill:  0 - POCT Urinalysis Dipstick - Urine Culture       Lelon Huh, MD  [facility]

## 2017-06-20 LAB — URINE CULTURE

## 2017-06-26 DIAGNOSIS — L219 Seborrheic dermatitis, unspecified: Secondary | ICD-10-CM | POA: Diagnosis not present

## 2017-06-26 DIAGNOSIS — L570 Actinic keratosis: Secondary | ICD-10-CM | POA: Diagnosis not present

## 2017-06-26 DIAGNOSIS — L578 Other skin changes due to chronic exposure to nonionizing radiation: Secondary | ICD-10-CM | POA: Diagnosis not present

## 2017-06-27 ENCOUNTER — Ambulatory Visit (INDEPENDENT_AMBULATORY_CARE_PROVIDER_SITE_OTHER): Payer: Medicare Other | Admitting: Family Medicine

## 2017-06-27 ENCOUNTER — Ambulatory Visit
Admission: RE | Admit: 2017-06-27 | Discharge: 2017-06-27 | Disposition: A | Discharge status: Home / Self Care | Payer: Medicare Other | Source: Ambulatory Visit | Attending: Family Medicine | Admitting: Family Medicine

## 2017-06-27 ENCOUNTER — Encounter: Payer: Self-pay | Admitting: Family Medicine

## 2017-06-27 VITALS — BP 134/68 | HR 99 | Temp 97.7°F | Resp 16 | Wt 142.0 lb

## 2017-06-27 DIAGNOSIS — M47812 Spondylosis without myelopathy or radiculopathy, cervical region: Secondary | ICD-10-CM | POA: Insufficient documentation

## 2017-06-27 DIAGNOSIS — R82998 Other abnormal findings in urine: Secondary | ICD-10-CM

## 2017-06-27 DIAGNOSIS — M542 Cervicalgia: Secondary | ICD-10-CM

## 2017-06-27 DIAGNOSIS — M25432 Effusion, left wrist: Secondary | ICD-10-CM

## 2017-06-27 DIAGNOSIS — R410 Disorientation, unspecified: Secondary | ICD-10-CM

## 2017-06-27 DIAGNOSIS — M25532 Pain in left wrist: Secondary | ICD-10-CM

## 2017-06-27 DIAGNOSIS — M62838 Other muscle spasm: Secondary | ICD-10-CM | POA: Diagnosis not present

## 2017-06-27 DIAGNOSIS — M19032 Primary osteoarthritis, left wrist: Secondary | ICD-10-CM | POA: Diagnosis not present

## 2017-06-27 LAB — POCT URINALYSIS DIPSTICK
Appearance: NORMAL
Bilirubin, UA: NEGATIVE
Blood, UA: NEGATIVE
Glucose, UA: NEGATIVE
Ketones, UA: NEGATIVE
Nitrite, UA: NEGATIVE
Odor: NORMAL
Protein, UA: NEGATIVE
Spec Grav, UA: 1.025 (ref 1.010–1.025)
Urobilinogen, UA: 0.2 E.U./dL
pH, UA: 7 (ref 5.0–8.0)

## 2017-06-27 NOTE — Progress Notes (Signed)
Patient: Monica Singh    DOB: 1928/12/30   84 y.o.   MRN: [id number] Visit Date: 06/27/2017  Today's Provider: Lavon Paganini, MD   I, Martha Clan, CMA, am acting as scribe for Lavon Paganini, MD.  Chief Complaint  Patient presents with  . Malaise   Subjective:    HPI   Pt presents with complaints of neck pain, neck stiffness, wrist pain and swelling, as well as malaise. She was seen on 06/18/2017, and was treated for a UTI with Cipro 250 mg 1/2 tab BID. Daughter states she is still having some confusion, and is concerned that UTI has not resolved. Daughter is also wondering of other sx could be caused by UTI. Pt denies dysuria, hematuria and frequency.  Thinks confusion started with the UTI. Hasn't improved despite finishing course of Cipro.  (Reviewed Urine culture from last visit that shows Klebsiella that is sensitive to Cipro).  Confusion is mild and intermittent.  Patient is oriented, but got a bit confused giving directions in the car today on a route that she has done a bunch of times.  Neck pain started 2 days ago.  It is bilateral over musculature at base of skull. Feels as though ROM is significantly limited 2/2 stiffness of muscles.  Pain and stiffness seem to be getting worse.  No change in pillow or sleeping habits.  No known injury or trauma.  Never had neck pain like this previously.  L wrist swelling: Noticed first 2 days ago.  New problem.  Seems to be getting worse.  Pain with touching the area of swelling. Some loss of ROM of thumb.  Hasn't tried any medications.  Of note, she was taking Naproxen and Flexeril for low back pain until 3-4 days ago.   Allergies  Allergen Reactions  . Lac Bovis Diarrhea and Nausea And Vomiting  . Milk-Related Compounds Diarrhea and Nausea And Vomiting  . Sulfa Antibiotics Hives and Itching     Current Outpatient Medications:  .  cyclobenzaprine (FLEXERIL) 5 MG tablet, Take 1 tablet (5 mg total) by mouth  3 (three) times daily as needed (BACK PAIN.). Do not mix with alcohol., Disp: 30 tablet, Rfl: 1 .  docusate sodium (STOOL SOFTENER) 100 MG capsule, Take 100 mg by mouth daily., Disp: , Rfl:  .  fluticasone (FLONASE) 50 MCG/ACT nasal spray, Place 2 sprays into both nostrils daily., Disp: 16 g, Rfl: 6 .  Ipratropium-Albuterol (COMBIVENT) 20-100 MCG/ACT AERS respimat, Inhale 1 puff into the lungs 3 (three) times daily. , Disp: , Rfl:  .  naproxen (NAPROSYN) 500 MG tablet, Take 1 tablet (500 mg total) by mouth 2 (two) times daily as needed for up to 15 days for moderate pain. Take with food, Disp: 30 tablet, Rfl: 0 .  omeprazole (PRILOSEC) 20 MG capsule, Take 20 mg by mouth daily., Disp: , Rfl:  .  Probiotic Product (ALIGN) 4 MG CAPS, Take 1 capsule by mouth daily., Disp: , Rfl:  .  risedronate (ACTONEL) 35 MG tablet, TAKE 1 TABLET BY MOUTH EVERY SUNDAY AS DIRECTED, Disp: 4 tablet, Rfl: 12 .  albuterol (PROVENTIL) (2.5 MG/3ML) 0.083% nebulizer solution, Take 2.5 mg by nebulization every 4 (four) hours as needed for wheezing or shortness of breath., Disp: , Rfl:    Review of Systems  Constitutional: Positive for appetite change and fatigue. Negative for activity change, chills, diaphoresis, fever and unexpected weight change.  Cardiovascular: Negative for chest pain, palpitations and leg swelling.  Genitourinary: Negative for dysuria, frequency, hematuria and urgency.  Musculoskeletal: Positive for arthralgias, joint swelling, neck pain and neck stiffness.  Psychiatric/Behavioral: Positive for confusion.    Social History   Tobacco Use  . Smoking status: Former Smoker    Packs/day: 0.25    Types: Cigarettes    Last attempt to quit: 06/21/1989    Years since quitting: 28.0  . Smokeless tobacco: Former Systems developer    Quit date: 01/28/1989  Substance Use Topics  . Alcohol use: Yes    Alcohol/week: 8.4 oz    Types: 14 Glasses of wine per week   Objective:   BP 134/68 (BP Location: Left Arm, Patient  Position: Sitting, Cuff Size: Large)   Pulse 99   Temp 97.7 F (36.5 C) (Oral)   Resp 16   Wt 142 lb (64.4 kg)   SpO2 96%   BMI 25.97 kg/m  Vitals:   06/27/17 1117  BP: 134/68  Pulse: 99  Resp: 16  Temp: 97.7 F (36.5 C)  TempSrc: Oral  SpO2: 96%  Weight: 142 lb (64.4 kg)     Physical Exam  Constitutional: She is oriented to person, place, and time. She appears well-developed and well-nourished. No distress.  HENT:  Head: Normocephalic and atraumatic.  Eyes: Conjunctivae are normal. No scleral icterus.  Neck: Neck supple. No thyromegaly present.  Cardiovascular: Normal rate, regular rhythm, normal heart sounds and intact distal pulses.  No murmur heard. Pulmonary/Chest: Effort normal and breath sounds normal. No respiratory distress.  Abdominal: Soft. She exhibits no distension. There is no tenderness. There is no CVA tenderness.  Musculoskeletal: She exhibits no edema.  Lymphadenopathy:    She has no cervical adenopathy.  Neurological: She is alert and oriented to person, place, and time. No cranial nerve deficit.  Skin: Skin is warm and dry. Capillary refill takes less than 2 seconds. No rash noted.  Psychiatric: She has a normal mood and affect. Her behavior is normal.  Vitals reviewed.   Results for orders placed or performed in visit on 06/27/17  POCT urinalysis dipstick  Result Value Ref Range   Color, UA yellow    Clarity, UA clear    Glucose, UA Negative Negative   Bilirubin, UA negative    Ketones, UA negative    Spec Grav, UA 1.025 1.010 - 1.025   Blood, UA negative    pH, UA 7.0 5.0 - 8.0   Protein, UA Negative Negative   Urobilinogen, UA 0.2 0.2 or 1.0 E.U./dL   Nitrite, UA negative    Leukocytes, UA Trace (A) Negative   Appearance normal    Odor normal        Assessment & Plan:    1. Confusion - unsure of patient's baseline, but she is A&Ox3 and appears well - UA much improved from previous - Klebsiella UTI was sensitive to antibiotic used  to treat it and patient is asymptomatic otherwise - will send another UCx to confirm, but do not suspect an infection at this time - POCT urinalysis dipstick  2. Pain and swelling of left wrist - appears to have pain at [facility] joint of L wrist with some swelling/effusion - likely 2/2 OA - resume Naproxen for next 2 weeks - get XRay to confirm - discussed RICE - discussed return precautions - DG Wrist Complete Left; Future  3. Neck muscle spasm 4. Neck pain - no meningeal signs - neck muscles are diffusely spasmed and ROM is severely limited - no radicular symptoms - resume Naproxen  and flexeril - return precautions discussed - get XRays to ensure no occult compression fractures - DG Cervical Spine Complete; Future  5. Leukocytes in urine - Urine Culture   Return if symptoms worsen or fail to improve.   The entirety of the information documented in the History of Present Illness, Review of Systems and Physical Exam were personally obtained by me. Portions of this information were initially documented by Raquel Sarna Ratchford, CMA and reviewed by me for thoroughness and accuracy.    [name] Crews, MD, MPH [facility] 06/27/2017 2:29 PM

## 2017-06-27 NOTE — Patient Instructions (Signed)
RICE for Routine Care of Injuries Many injuries can be cared for using rest, ice, compression, and elevation (RICE therapy). Using RICE therapy can help to lessen pain and swelling. It can help your body to heal. Rest Reduce your normal activities and avoid using the injured part of your body. You can go back to your normal activities when you feel okay and your doctor says it is okay. Ice Do not put ice on your bare skin.  Put ice in a plastic bag.  Place a towel between your skin and the bag.  Leave the ice on for 20 minutes, 2-3 times a day.  Do this for as long as told by your doctor. Compression Compression means putting pressure on the injured area. This can be done with an elastic bandage. If an elastic bandage has been applied:  Remove and reapply the bandage every 3-4 hours or as told by your doctor.  Make sure the bandage is not wrapped too tight. Wrap the bandage more loosely if part of your body beyond the bandage is blue, swollen, cold, painful, or loses feeling (numb).  See your doctor if the bandage seems to make your problems worse.  Elevation Elevation means keeping the injured area raised. Raise the injured area above your heart or the center of your chest if you can. When should I get help? You should get help if:  You keep having pain and swelling.  Your symptoms get worse.  Get help right away if: You should get help right away if:  You have sudden bad pain at or below the area of your injury.  You have redness or more swelling around your injury.  You have tingling or numbness at or below the injury that does not go away when you take off the bandage.  This information is not intended to replace advice given to you by your health care provider. Make sure you discuss any questions you have with your health care provider. Document Released: 07/03/2007 Document Revised: 12/12/2015 Document Reviewed: 12/22/2013 Elsevier Interactive Patient Education  2017  Elsevier Inc.  

## 2017-06-29 LAB — URINE CULTURE

## 2017-06-30 ENCOUNTER — Telehealth: Payer: Self-pay | Admitting: Family Medicine

## 2017-06-30 NOTE — Telephone Encounter (Signed)
Pt advised. LMTCB for daughter.

## 2017-06-30 NOTE — Telephone Encounter (Signed)
-----   Message from [name] Crews, MD sent at 06/30/2017  8:38 AM EDT ----- Urine culture with no significant amount of bacteria.  UTI was cured with antibiotic course.  [name] Crews, MD, MPH [facility] 06/30/2017 8:38 AM

## 2017-06-30 NOTE — Telephone Encounter (Signed)
-----   Message from [name] Crews, MD sent at 06/30/2017  8:37 AM EDT ----- Significant arthritis changes, especially at site of swelling.  See C-spine XRay results also  Bacigalupo, Dionne Bucy, MD, MPH [facility] 06/30/2017 8:37 AM

## 2017-06-30 NOTE — Telephone Encounter (Signed)
-----   Message from [name] Crews, MD sent at 06/30/2017  8:36 AM EDT ----- Neck XRay shows previous fusion site with no acute changes or breakdown.  There is arthritis that seems to be worsening.  See Wrist XRay results also  Bacigalupo, Dionne Bucy, MD, MPH [facility] 06/30/2017 8:36 AM

## 2017-06-30 NOTE — Telephone Encounter (Signed)
Daughter called saying mom was in last week and got an xray of left wrist and neck.  Do we have results back yet  Daughter Virgina's call back is [phone] or [phone]  Thanks

## 2017-07-04 NOTE — Telephone Encounter (Signed)
Advised  ED 

## 2017-07-04 NOTE — Telephone Encounter (Signed)
No answer, phone went dead after ringing, unable to leave message ED

## 2017-07-28 NOTE — Progress Notes (Signed)
Patient: Monica Singh    DOB: 05/19/1928   84 y.o.   MRN: [id number] Visit Date: 07/29/2017  Today's Provider: Lelon Huh, MD   Chief Complaint  Patient presents with  . Urinary Frequency   Subjective:    Urinary Tract Infection   This is a new problem. The current episode started yesterday. The problem has been gradually improving. There has been no fever. Associated symptoms include frequency, hesitancy and urgency. Pertinent negatives include no chills, hematuria, nausea or vomiting. Associated symptoms comments: Also decreased urine output, lower abdominal pain and cloudy urine. Treatments tried: Azo. The treatment provided mild relief.       Allergies  Allergen Reactions  . Lac Bovis Diarrhea and Nausea And Vomiting  . Milk-Related Compounds Diarrhea and Nausea And Vomiting  . Sulfa Antibiotics Hives and Itching     Current Outpatient Medications:  .  albuterol (PROVENTIL) (2.5 MG/3ML) 0.083% nebulizer solution, Take 2.5 mg by nebulization every 4 (four) hours as needed for wheezing or shortness of breath., Disp: , Rfl:  .  cyclobenzaprine (FLEXERIL) 5 MG tablet, Take 1 tablet (5 mg total) by mouth 3 (three) times daily as needed (BACK PAIN.). Do not mix with alcohol., Disp: 30 tablet, Rfl: 1 .  docusate sodium (STOOL SOFTENER) 100 MG capsule, Take 100 mg by mouth daily., Disp: , Rfl:  .  fluticasone (FLONASE) 50 MCG/ACT nasal spray, Place 2 sprays into both nostrils daily., Disp: 16 g, Rfl: 6 .  Ipratropium-Albuterol (COMBIVENT) 20-100 MCG/ACT AERS respimat, Inhale 1 puff into the lungs 3 (three) times daily. , Disp: , Rfl:  .  omeprazole (PRILOSEC) 20 MG capsule, Take 20 mg by mouth daily., Disp: , Rfl:  .  Probiotic Product (ALIGN) 4 MG CAPS, Take 1 capsule by mouth daily., Disp: , Rfl:  .  risedronate (ACTONEL) 35 MG tablet, TAKE 1 TABLET BY MOUTH EVERY SUNDAY AS DIRECTED, Disp: 4 tablet, Rfl: 12  Review of Systems  Constitutional: Negative for  appetite change, chills, fatigue and fever.  Respiratory: Negative for chest tightness and shortness of breath.   Cardiovascular: Negative for chest pain and palpitations.  Gastrointestinal: Negative for abdominal pain, nausea and vomiting.  Genitourinary: Positive for frequency, hesitancy and urgency. Negative for hematuria.  Neurological: Negative for dizziness and weakness.    Social History   Tobacco Use  . Smoking status: Former Smoker    Packs/day: 0.25    Types: Cigarettes    Last attempt to quit: 06/21/1989    Years since quitting: 28.1  . Smokeless tobacco: Former Systems developer    Quit date: 01/28/1989  Substance Use Topics  . Alcohol use: Yes    Alcohol/week: 8.4 oz    Types: 14 Glasses of wine per week   Objective:   BP 138/70 (BP Location: Left Arm, Patient Position: Sitting, Cuff Size: Normal)   Pulse 86   Temp 97.7 F (36.5 C) (Oral)   Resp 16   Wt 146 lb (66.2 kg)   SpO2 97% Comment: room air  BMI 26.70 kg/m  Vitals:   07/29/17 0948  BP: 138/70  Pulse: 86  Resp: 16  Temp: 97.7 F (36.5 C)  TempSrc: Oral  SpO2: 97%  Weight: 146 lb (66.2 kg)     Physical Exam  General Appearance:    Alert, cooperative, no distress  Eyes:    PERRL, conjunctiva/corneas clear, EOM's intact       Lungs:     Clear to auscultation bilaterally,  respirations unlabored  Heart:    Regular rate and rhythm  Abdomen:   bowel sounds present and normal in all 4 quadrants, soft or nontender. No CVA tenderness     Results for orders placed or performed in visit on 07/29/17  POCT Urinalysis Dipstick  Result Value Ref Range   Color, UA amber    Clarity, UA cloudy    Glucose, UA Negative Negative   Bilirubin, UA small (+)    Ketones, UA negative    Spec Grav, UA 1.010 1.010 - 1.025   Blood, UA Trace (Hemolyzed)    pH, UA 7.5 5.0 - 8.0   Protein, UA Negative Negative   Urobilinogen, UA 0.2 0.2 or 1.0 E.U./dL   Nitrite, UA positive    Leukocytes, UA Large (3+) (A) Negative    Appearance     Odor         Assessment & Plan:     1. Urinary tract infection with hematuria, site unspecified  - POCT Urinalysis Dipstick - Urine Culture - ciprofloxacin (CIPRO) 100 MG tablet; Take 1 tablet (100 mg total) by mouth 2 (two) times daily for 7 days.  Dispense: 14 tablet; Refill: 0       Lelon Huh, MD  [facility]

## 2017-07-29 ENCOUNTER — Ambulatory Visit (INDEPENDENT_AMBULATORY_CARE_PROVIDER_SITE_OTHER): Payer: Medicare Other | Admitting: Family Medicine

## 2017-07-29 ENCOUNTER — Encounter: Payer: Self-pay | Admitting: Family Medicine

## 2017-07-29 VITALS — BP 138/70 | HR 86 | Temp 97.7°F | Resp 16 | Wt 146.0 lb

## 2017-07-29 DIAGNOSIS — R319 Hematuria, unspecified: Secondary | ICD-10-CM

## 2017-07-29 DIAGNOSIS — N390 Urinary tract infection, site not specified: Secondary | ICD-10-CM

## 2017-07-29 LAB — POCT URINALYSIS DIPSTICK
Glucose, UA: NEGATIVE
Ketones, UA: NEGATIVE
Nitrite, UA: POSITIVE
Protein, UA: NEGATIVE
Spec Grav, UA: 1.01 (ref 1.010–1.025)
Urobilinogen, UA: 0.2 E.U./dL
pH, UA: 7.5 (ref 5.0–8.0)

## 2017-07-29 MED ORDER — CIPROFLOXACIN HCL 100 MG PO TABS: 100.0000 mg | ORAL_TABLET | Freq: Two times a day (BID) | ORAL | 0 refills | Status: AC

## 2017-07-29 NOTE — Patient Instructions (Signed)
   Drink at least 6 glasses of water every day   Drink an 8-10 ounce glass of cranberry juice every evening to help prevent more bladder infections.

## 2017-08-01 LAB — URINE CULTURE

## 2017-08-12 ENCOUNTER — Other Ambulatory Visit: Payer: Medicare Other

## 2017-08-12 ENCOUNTER — Ambulatory Visit: Payer: Medicare Other | Admitting: Oncology

## 2017-08-29 ENCOUNTER — Telehealth: Payer: Self-pay | Admitting: Family Medicine

## 2017-08-29 NOTE — Telephone Encounter (Signed)
She probably has another UTI. She needs to bring in a urine sample for culture and we can start her antibiotic once she has collected it.

## 2017-08-29 NOTE — Telephone Encounter (Signed)
Tried calling pt's daughter [name] back, no answer and no vm. Will try again later.

## 2017-08-29 NOTE — Telephone Encounter (Signed)
Pt's daughter ([name])  States the patient has been a little fuzzy about driving.  States she was on her way to [place] and went way out on the [place] the [place].  States she couldn't member directional things but could remember a coupon she had.    States the patient has not been great about taking her medication either.   Pt's daughter is requesting a call back.

## 2017-08-29 NOTE — Telephone Encounter (Signed)
Please advise 

## 2017-09-01 NOTE — Telephone Encounter (Signed)
Patient's daughter Monica Singh was advised. Monica Singh stated she will bring sample by tomorrow.

## 2017-09-02 ENCOUNTER — Other Ambulatory Visit: Payer: Self-pay | Admitting: *Deleted

## 2017-09-02 ENCOUNTER — Other Ambulatory Visit: Payer: Self-pay | Admitting: Family Medicine

## 2017-09-02 DIAGNOSIS — N390 Urinary tract infection, site not specified: Secondary | ICD-10-CM

## 2017-09-02 DIAGNOSIS — R319 Hematuria, unspecified: Principal | ICD-10-CM

## 2017-09-02 LAB — POCT URINALYSIS DIPSTICK
Appearance: ABNORMAL
Bilirubin, UA: NEGATIVE
Glucose, UA: NEGATIVE
Ketones, UA: NEGATIVE
Nitrite, UA: NEGATIVE
Odor: NORMAL
Protein, UA: POSITIVE — AB
Spec Grav, UA: 1.015 (ref 1.010–1.025)
Urobilinogen, UA: 0.2 E.U./dL
pH, UA: 6 (ref 5.0–8.0)

## 2017-09-02 MED ORDER — CIPROFLOXACIN HCL 500 MG PO TABS: 500.0000 mg | ORAL_TABLET | Freq: Two times a day (BID) | ORAL | 0 refills | Status: DC

## 2017-09-02 MED ORDER — ESTROGENS, CONJUGATED 0.625 MG/GM VA CREA: TOPICAL_CREAM | VAGINAL | 12 refills | Status: DC

## 2017-09-02 NOTE — Telephone Encounter (Signed)
Patient's daughter [name] dropped off urine.

## 2017-09-30 ENCOUNTER — Other Ambulatory Visit: Payer: Self-pay | Admitting: Family Medicine

## 2017-10-07 ENCOUNTER — Encounter: Payer: Self-pay | Admitting: Family Medicine

## 2017-10-07 ENCOUNTER — Ambulatory Visit (INDEPENDENT_AMBULATORY_CARE_PROVIDER_SITE_OTHER): Payer: Medicare Other | Admitting: Family Medicine

## 2017-10-07 VITALS — BP 138/75 | HR 72 | Temp 97.9°F | Resp 18

## 2017-10-07 DIAGNOSIS — J069 Acute upper respiratory infection, unspecified: Secondary | ICD-10-CM

## 2017-10-07 NOTE — Progress Notes (Signed)
Patient: Monica Singh    DOB: 1928-04-08   84 y.o.   MRN: [id number] Visit Date: 10/07/2017  Today's Provider: Lelon Huh, MD   Chief Complaint  Patient presents with  . Cough    x 1 day   Subjective:    Cough  This is a new problem. The current episode started yesterday. The problem has been unchanged. The cough is non-productive. Associated symptoms include a sore throat. Pertinent negatives include no chest pain, chills, ear congestion, ear pain, fever, headaches, heartburn, hemoptysis, myalgias, nasal congestion, postnasal drip, rash, rhinorrhea, shortness of breath, sweats, weight loss or wheezing. She has tried nothing for the symptoms.       Allergies  Allergen Reactions  . Lac Bovis Diarrhea and Nausea And Vomiting  . Milk-Related Compounds Diarrhea and Nausea And Vomiting  . Sulfa Antibiotics Hives and Itching     Current Outpatient Medications:  .  albuterol (PROVENTIL) (2.5 MG/3ML) 0.083% nebulizer solution, Take 2.5 mg by nebulization every 4 (four) hours as needed for wheezing or shortness of breath., Disp: , Rfl:  .  COMBIVENT RESPIMAT 20-100 MCG/ACT AERS respimat, INHALE 2 PUFFS INTO THE LUNGS 3 TIMES DAILY, Disp: 4 g, Rfl: 5 .  docusate sodium (STOOL SOFTENER) 100 MG capsule, Take 100 mg by mouth daily., Disp: , Rfl:  .  fluticasone (FLONASE) 50 MCG/ACT nasal spray, Place 2 sprays into both nostrils daily., Disp: 16 g, Rfl: 6 .  risedronate (ACTONEL) 35 MG tablet, TAKE 1 TABLET BY MOUTH EVERY SUNDAY AS DIRECTED, Disp: 4 tablet, Rfl: 12 .  conjugated estrogens (PREMARIN) vaginal cream, 1 applicator every night for 7 nights, then twice a week. (Patient not taking: Reported on 10/07/2017), Disp: 30 g, Rfl: 12 .  cyclobenzaprine (FLEXERIL) 5 MG tablet, Take 1 tablet (5 mg total) by mouth 3 (three) times daily as needed (BACK PAIN.). Do not mix with alcohol. (Patient not taking: Reported on 10/07/2017), Disp: 30 tablet, Rfl: 1 .  omeprazole  (PRILOSEC) 20 MG capsule, Take 20 mg by mouth daily., Disp: , Rfl:  .  Probiotic Product (ALIGN) 4 MG CAPS, Take 1 capsule by mouth daily., Disp: , Rfl:   Review of Systems  Constitutional: Negative for chills, fever and weight loss.  HENT: Positive for sore throat and voice change. Negative for ear pain, postnasal drip and rhinorrhea.   Respiratory: Positive for cough. Negative for hemoptysis, shortness of breath and wheezing.   Cardiovascular: Negative for chest pain.  Gastrointestinal: Negative for heartburn.  Musculoskeletal: Negative for myalgias.  Skin: Negative for rash.  Neurological: Negative for headaches.    Social History   Tobacco Use  . Smoking status: Former Smoker    Packs/day: 0.25    Types: Cigarettes    Last attempt to quit: 06/21/1989    Years since quitting: 28.3  . Smokeless tobacco: Former Systems developer    Quit date: 01/28/1989  Substance Use Topics  . Alcohol use: Yes    Alcohol/week: 14.0 standard drinks    Types: 14 Glasses of wine per week   Objective:   BP 138/75 (BP Location: Right Arm, Patient Position: Sitting, Cuff Size: Normal)   Pulse 72   Temp 97.9 F (36.6 C) (Oral)   Resp 18   SpO2 98%  Vitals:   10/07/17 1624  BP: 138/75  Pulse: 72  Resp: 18  Temp: 97.9 F (36.6 C)  TempSrc: Oral  SpO2: 98%     Physical Exam  General Appearance:  Alert, cooperative, no distress  HENT:   bilateral TM normal without fluid or infection, neck without nodes, throat normal without erythema or exudate, post nasal drip noted and nasal mucosa congested  Eyes:    PERRL, conjunctiva/corneas clear, EOM's intact       Lungs:     Clear to auscultation bilaterally, respirations unlabored  Heart:    Regular rate and rhythm  Neurologic:   Awake, alert, oriented x 3. No apparent focal neurological           defect.           Assessment & Plan:     1. Upper respiratory tract infection, unspecified type Counseled regarding signs and symptoms of viral and  bacterial respiratory infections. Advised to call or return for additional evaluation if she develops any sign of bacterial infection, or if current symptoms last longer than 10 days.         Lelon Huh, MD  [facility]

## 2017-10-23 ENCOUNTER — Other Ambulatory Visit: Payer: Self-pay | Admitting: Family Medicine

## 2017-10-30 DIAGNOSIS — N3000 Acute cystitis without hematuria: Secondary | ICD-10-CM | POA: Diagnosis not present

## 2017-10-30 DIAGNOSIS — N390 Urinary tract infection, site not specified: Secondary | ICD-10-CM | POA: Diagnosis not present

## 2017-11-14 ENCOUNTER — Telehealth: Payer: Self-pay | Admitting: Family Medicine

## 2017-11-14 DIAGNOSIS — N390 Urinary tract infection, site not specified: Secondary | ICD-10-CM

## 2017-11-14 NOTE — Telephone Encounter (Signed)
Pt called saying she would like a referral to Urology.  She has had several UTI's this years and has one now but she had an extra bottle of Cipro that she has started taking.  Pt's call back [phone]  Thanks teri

## 2017-11-14 NOTE — Telephone Encounter (Signed)
Ok to order? Please advise. Thanks!  

## 2017-11-27 ENCOUNTER — Encounter: Payer: Self-pay | Admitting: Urology

## 2017-11-27 ENCOUNTER — Ambulatory Visit (INDEPENDENT_AMBULATORY_CARE_PROVIDER_SITE_OTHER): Payer: Medicare Other | Admitting: Urology

## 2017-11-27 VITALS — BP 136/84 | HR 85 | Ht 62.0 in | Wt 144.9 lb

## 2017-11-27 DIAGNOSIS — N390 Urinary tract infection, site not specified: Secondary | ICD-10-CM | POA: Diagnosis not present

## 2017-11-27 DIAGNOSIS — N952 Postmenopausal atrophic vaginitis: Secondary | ICD-10-CM | POA: Diagnosis not present

## 2017-11-27 LAB — URINALYSIS, COMPLETE
Bilirubin, UA: NEGATIVE
Glucose, UA: NEGATIVE
Ketones, UA: NEGATIVE
Nitrite, UA: NEGATIVE
Protein, UA: NEGATIVE
RBC, UA: NEGATIVE
Specific Gravity, UA: 1.02 (ref 1.005–1.030)
Urobilinogen, Ur: 0.2 mg/dL (ref 0.2–1.0)
pH, UA: 6 (ref 5.0–7.5)

## 2017-11-27 LAB — MICROSCOPIC EXAMINATION: RBC, UA: NONE SEEN /hpf (ref 0–2)

## 2017-11-27 LAB — BLADDER SCAN AMB NON-IMAGING: Scan Result: 0

## 2017-11-27 NOTE — Progress Notes (Signed)
11/27/2017 11:45 AM   [place] [birth year] [id number]  Referring provider: Birdie Sons, [place], [place] [address]  Chief Complaint  Patient presents with  . Recurrent UTI    HPI: Patient is a 84 -year-old Caucasian female who is referred to [place] by Dr. Kirstie Peri. Fisher for recurrent urinary tract infections with her daughter, [name].    Patient states that she has had five urinary tract infections over the last year.  Reviewing her records,  she has had 4 documented positive urine cultures. July 29, 2017+ for E. Coli Jun 18, 2017+ for Klebsiella oxytoca resistant to ampicillin and cefazolin February 10, 2017+ for Klebsiella pneumoniae resistant to ampicillin December 16, 2016+ for Providencia rettgeri resistant to Augmentin, cefazolin and nitrofurantoin  Her symptoms with a urinary tract infection consist of heavy feeling down there, urgency and feelings of incomplete emptying.    She does/does not have a history of nephrolithiasis, GU surgery or GU trauma.  She is not sexually active.    She is postmenopausal.   She admits to constipation.    She does not engage in good perineal hygiene. She does not take tub baths.   She has SUI incontinence.  She is using incontinence pads, once daily.    She is not drinking a lot of water daily.  She drinks a cup and a half of coffee daily.  She may drink a soda once a week.  No tea.  No juices.  Two glasses of wine a night.    Today, she is having urgency, nocturia and incontinence.   Patient denies any gross hematuria, dysuria or suprapubic/flank pain.  Patient denies any fevers, chills, nausea or vomiting.  Her UA is moderate bacteria.  Her PVR is 0 mL.    PMH: Past Medical History:  Diagnosis Date  . Adnexal mass 06/03/2014   since 2009  . Arthritis   . 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 GERD  (gastroesophageal reflux disease)   . Herpes zoster without complication 06/29/7033  . HTN (hypertension)   . Non Hodgkin's lymphoma ([place])   . Recurrent UTI     Surgical History: Past Surgical History:  Procedure Laterality Date  . ABDOMINAL SURGERY  2009   abdominal mass+ NH lymphoma,  . BACK SURGERY    . CATARACT EXTRACTION  1997   right eye  . cervical neck fusion  1995  . DENTAL SURGERY     screws  . KYPHOPLASTY N/A 02/21/2017   Procedure: [id number];  Surgeon: Hessie Knows, MD;  Location: [facility] ORS;  Service: Orthopedics;  Laterality: N/A;  . laparotomy with biopsy  03/02/2007  . PORTACATH PLACEMENT  2009  . [place]  . SQUAMOUS CELL CARCINOMA EXCISION     right arm  . TOTAL KNEE ARTHROPLASTY Right   . VAGINAL HYSTERECTOMY  1971    Home Medications:  Allergies as of 11/27/2017      Reactions   Lac Bovis Diarrhea, Nausea And Vomiting   Milk-related Compounds Diarrhea, Nausea And Vomiting   Sulfa Antibiotics Hives, Itching      Medication List        Accurate as of 11/27/17 11:45 AM. Always use your most recent med list.          ALIGN 4 MG Caps Take 1 capsule by mouth daily.   COMBIVENT RESPIMAT 20-100  MCG/ACT Aers respimat Generic drug:  Ipratropium-Albuterol INHALE 2 PUFFS INTO THE LUNGS 3 TIMES DAILY   conjugated estrogens vaginal cream Commonly known as:  PREMARIN 1 applicator every night for 7 nights, then twice a week.   cyclobenzaprine 5 MG tablet Commonly known as:  FLEXERIL Take 1 tablet (5 mg total) by mouth 3 (three) times daily as needed (BACK PAIN.). Do not mix with alcohol.   fluticasone 50 MCG/ACT nasal spray Commonly known as:  FLONASE Place 2 sprays into both nostrils daily.   omeprazole 20 MG capsule Commonly known as:  PRILOSEC Take 20 mg by mouth daily.   risedronate 35 MG tablet Commonly known as:  ACTONEL TAKE 1 TABLET BY MOUTH EVERY SUNDAY AS DIRECTED   STOOL SOFTENER 100 MG capsule Generic drug:  docusate  sodium Take 100 mg by mouth daily.       Allergies:  Allergies  Allergen Reactions  . Lac Bovis Diarrhea and Nausea And Vomiting  . Milk-Related Compounds Diarrhea and Nausea And Vomiting  . Sulfa Antibiotics Hives and Itching    Family History: Family History  Problem Relation Age of Onset  . Breast cancer Sister   . Dementia Sister   . Cataracts Sister   . Heart attack Brother   . CAD Brother   . Heart attack Brother   . Leukemia Grandchild        granddaughter  . Kidney disease Neg Hx   . Bladder Cancer Neg Hx     Social History:  reports that she quit smoking about 28 years ago. Her smoking use included cigarettes. She smoked 0.25 packs per day. She quit smokeless tobacco use about 28 years ago. She reports that she drinks about 14.0 standard drinks of alcohol per week. She reports that she does not use drugs.  ROS: UROLOGY Frequent Urination?: No Hard to postpone urination?: Yes Burning/pain with urination?: No Get up at night to urinate?: Yes Leakage of urine?: Yes Urine stream starts and stops?: No Trouble starting stream?: No Do you have to strain to urinate?: No Blood in urine?: No Urinary tract infection?: Yes Sexually transmitted disease?: No Injury to kidneys or bladder?: Yes Painful intercourse?: No Weak stream?: Yes Currently pregnant?: No Vaginal bleeding?: No Last menstrual period?: n  Gastrointestinal Nausea?: No Vomiting?: Yes Indigestion/heartburn?: No Diarrhea?: No Constipation?: Yes  Constitutional Fever: No Night sweats?: No Weight loss?: No Fatigue?: No  Skin Skin rash/lesions?: No Itching?: No  Eyes Blurred vision?: No Double vision?: No  Ears/Nose/Throat Sore throat?: No Sinus problems?: Yes  Hematologic/Lymphatic Swollen glands?: No Easy bruising?: No  Cardiovascular Leg swelling?: No Chest pain?: No  Respiratory Cough?: No Shortness of breath?: No  Endocrine Excessive thirst?:  No  Musculoskeletal Back pain?: Yes Joint pain?: Yes  Neurological Headaches?: No Dizziness?: No  Psychologic Depression?: No Anxiety?: No  Physical Exam: BP 136/84 (BP Location: Left Arm, Patient Position: Sitting, Cuff Size: Small)   Pulse 85   Ht 5' 2" (1.575 m)   Wt 144 lb 14.4 oz (65.7 kg)   BMI 26.50 kg/m   Constitutional:  Well nourished. Alert and oriented, No acute distress. HEENT: [place] AT, moist mucus membranes.  Trachea midline, no masses. Cardiovascular: No clubbing, cyanosis, or edema. Respiratory: Normal respiratory effort, no increased work of breathing. GI: Abdomen is soft, non tender, non distended, no abdominal masses. Liver and spleen not palpable.  No hernias appreciated.  Stool sample for occult testing is not indicated.   GU: No CVA tenderness.  No  bladder fullness or masses.  Atrophic external genitalia, normal pubic hair distribution, no lesions.  Normal urethral meatus, no lesions, no prolapse, no discharge.   No urethral masses, tenderness and/or tenderness. No bladder fullness, tenderness or masses. Pale vagina mucosa, poor estrogen effect, no discharge, no lesions, poor pelvic support, grade II cystocele noted no rectocele noted.  Cervix and uterus are surgically absent.  (could not palpate the ovarian mass) No adnexal/parametria masses or tenderness noted.  Anus and perineum are without rashes or lesions.    Skin: No rashes, bruises or suspicious lesions. Lymph: No cervical or inguinal adenopathy. Neurologic: Grossly intact, no focal deficits, moving all 4 extremities. Psychiatric: Normal mood and affect.  Laboratory Data: Lab Results  Component Value Date   WBC 16.3 (H) 02/17/2017   HGB 13.7 02/17/2017   HCT 40.5 02/17/2017   MCV 96.9 02/17/2017   PLT 295 02/17/2017    Lab Results  Component Value Date   CREATININE 0.79 02/17/2017    No results found for: PSA  No results found for: TESTOSTERONE  No results found for: HGBA1C  Lab Results   Component Value Date   TSH 2.36 11/12/2016    No results found for: CHOL, HDL, CHOLHDL, VLDL, LDLCALC  Lab Results  Component Value Date   AST 23 02/17/2017   Lab Results  Component Value Date   ALT 19 02/17/2017   No components found for: ALKALINEPHOPHATASE No components found for: BILIRUBINTOTAL  No results found for: ESTRADIOL  Urinalysis Moderate bacteria.  See Epic. I have reviewed the labs.   Pertinent Imaging: Results for ELZIE, SHEETS (MRN [id number]) as of 11/27/2017 11:42  Ref. Range 11/27/2017 10:38  Scan Result Unknown 0       Assessment & Plan:   1. rUTI's Criteria for recurrent UTI has been met with 2 or more infections in 6 months or 3 or greater infections in one year  Patient is instructed to increase their water intake until the urine is pale yellow or clear (10 to 12 cups daily)  Obtain a renal ultrasound to evaluate for possible nidus for infection  2. Vaginal atrophy I explained to the patient that when women go through menopause and her estrogen levels are severely diminished, the normal vaginal flora will change.  This is due to an increase of the vaginal canal's pH. Because of this, the vaginal canal may be colonized by bacteria from the rectum instead of the protective lactobacillus.  This, accompanied by the loss of the mucus barrier with vaginal atrophy, is a cause of recurrent urinary tract infections. In some studies, the use of vaginal estrogen cream has been demonstrated to reduce  recurrent urinary tract infections to one a year.  Patient has vaginal cream at home and she will start applying it today  She will follow up in one month for an exam.                                             Return in about 1 month (around 12/27/2017) for RUS report and exam .  These notes generated with voice recognition software. I apologize for typographical errors.  Zara Council, [name]  [facility] [address]  [place], [place] [address] [phone]

## 2017-11-27 NOTE — Patient Instructions (Signed)
Apply Premarin cream to apply 0.5mg  (pea-sized amount)  just inside the vaginal introitus with a finger-tip on Monday, Wednesday and Friday nights,

## 2017-12-03 ENCOUNTER — Ambulatory Visit
Admission: RE | Admit: 2017-12-03 | Discharge: 2017-12-03 | Disposition: A | Discharge status: Home / Self Care | Payer: Medicare Other | Source: Ambulatory Visit | Attending: Urology | Admitting: Urology

## 2017-12-03 DIAGNOSIS — N390 Urinary tract infection, site not specified: Secondary | ICD-10-CM | POA: Insufficient documentation

## 2017-12-03 DIAGNOSIS — N83202 Unspecified ovarian cyst, left side: Secondary | ICD-10-CM | POA: Insufficient documentation

## 2017-12-03 DIAGNOSIS — N200 Calculus of kidney: Secondary | ICD-10-CM | POA: Diagnosis not present

## 2017-12-08 ENCOUNTER — Telehealth: Payer: Self-pay | Admitting: Urology

## 2017-12-08 NOTE — Telephone Encounter (Signed)
She had a follow up for 12-30-17 and I moved her to 12-10-17   [facility]

## 2017-12-08 NOTE — Telephone Encounter (Signed)
-----   Message from Nori Riis, [name] sent at 12/08/2017  8:26 AM EST ----- May we get Monica Singh in to see me in the next two weeks?  Her left kidney is swollen and we need to discuss the next steps.

## 2017-12-08 NOTE — Telephone Encounter (Signed)
-----   Message from Nori Riis, [name] sent at 12/08/2017  8:26 AM EST ----- May we get Mrs. Kozlowski in to see me in the next two weeks?  Her left kidney is swollen and we need to discuss the next steps.

## 2017-12-10 ENCOUNTER — Ambulatory Visit: Payer: Medicare Other | Admitting: Urology

## 2017-12-30 ENCOUNTER — Ambulatory Visit: Payer: Medicare Other | Admitting: Urology

## 2017-12-30 ENCOUNTER — Encounter: Payer: Self-pay | Admitting: Urology

## 2017-12-30 VITALS — BP 161/81 | HR 78 | Ht 62.0 in | Wt 147.2 lb

## 2017-12-30 DIAGNOSIS — N1339 Other hydronephrosis: Secondary | ICD-10-CM

## 2017-12-30 NOTE — Progress Notes (Signed)
12/30/2017 11:37 AM   [place] May 26, 1928 [id number]  Referring provider: Birdie Sons, MD [address] [place], [place] [address]  Chief Complaint  Patient presents with  . Follow-up  . [place] report    HPI: Patient is an 84 year old Caucasian female with a history or rUTI's and vaginal atrophy who presents today for a RUS report.  Background history Patient is a 12 -year-old Caucasian female who is referred to [place] by Dr. Kirstie Peri. Fisher for recurrent urinary tract infections with her daughter, [name].  Patient states that she has had five urinary tract infections over the last year.  Reviewing her records,  she has had 4 documented positive urine cultures. July 29, 2017+ for E. Coli Jun 18, 2017+ for Klebsiella oxytoca resistant to ampicillin and cefazolin February 10, 2017+ for Klebsiella pneumoniae resistant to ampicillin December 16, 2016+ for Providencia rettgeri resistant to Augmentin, cefazolin and nitrofurantoin  Her symptoms with a urinary tract infection consist of heavy feeling down there, urgency and feelings of incomplete emptying.  She does/does not have a history of nephrolithiasis, GU surgery or GU trauma.  She is not sexually active.  She is postmenopausal.  She admits to constipation.  She does not engage in good perineal hygiene. She does not take tub baths.  She has SUI incontinence.  She is using incontinence pads, once daily.  She is not drinking a lot of water daily.  She drinks a cup and a half of coffee daily.  She may drink a soda once a week.  No tea.  No juices.  Two glasses of wine a night.    RUS on 12/03/2017 revealed odmerately severe left hydronephrosis. Cause for obstruction is not identified.  Simple left ovarian cyst is chronic.  She states she does have intermittent left-sided flank pain.  She states it is not severe.  It is worsened by laying on her left side.  She states this has been occurring for several weeks.  She  denies any symptoms of urinary tract infection at this time.  Patient denies any gross hematuria, dysuria or suprapubic/flank pain.  Patient denies any fevers, chills, nausea or vomiting.   PMH: Past Medical History:  Diagnosis Date  . Adnexal mass 06/03/2014   since 2009  . Arthritis   . 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 GERD (gastroesophageal reflux disease)   . Herpes zoster without complication 08/06/238  . HTN (hypertension)   . Non Hodgkin's lymphoma ([place])   . Recurrent UTI     Surgical History: Past Surgical History:  Procedure Laterality Date  . ABDOMINAL SURGERY  2009   abdominal mass+ NH lymphoma,  . BACK SURGERY    . CATARACT EXTRACTION  1997   right eye  . cervical neck fusion  1995  . DENTAL SURGERY     screws  . KYPHOPLASTY N/A 02/21/2017   Procedure: [id number];  Surgeon: Hessie Knows, MD;  Location: [facility] ORS;  Service: Orthopedics;  Laterality: N/A;  . laparotomy with biopsy  03/02/2007  . PORTACATH PLACEMENT  2009  . [place]  . SQUAMOUS CELL CARCINOMA EXCISION     right arm  . TOTAL KNEE ARTHROPLASTY Right   . VAGINAL HYSTERECTOMY  1971    Home Medications:  Allergies as of 12/30/2017      Reactions   Lac Bovis Diarrhea, Nausea And Vomiting  Milk-related Compounds Diarrhea, Nausea And Vomiting   Sulfa Antibiotics Hives, Itching      Medication List        Accurate as of 12/30/17 11:37 AM. Always use your most recent med list.          ALIGN 4 MG Caps Take 1 capsule by mouth daily.   COMBIVENT RESPIMAT 20-100 MCG/ACT Aers respimat Generic drug:  Ipratropium-Albuterol INHALE 2 PUFFS INTO THE LUNGS 3 TIMES DAILY   conjugated estrogens vaginal cream Commonly known as:  PREMARIN 1 applicator every night for 7 nights, then twice a week.   fluticasone 50 MCG/ACT nasal spray Commonly known as:  FLONASE Place 2 sprays into both nostrils daily.    omeprazole 20 MG capsule Commonly known as:  PRILOSEC Take 20 mg by mouth daily.   risedronate 35 MG tablet Commonly known as:  ACTONEL TAKE 1 TABLET BY MOUTH EVERY SUNDAY AS DIRECTED       Allergies:  Allergies  Allergen Reactions  . Lac Bovis Diarrhea and Nausea And Vomiting  . Milk-Related Compounds Diarrhea and Nausea And Vomiting  . Sulfa Antibiotics Hives and Itching    Family History: Family History  Problem Relation Age of Onset  . Breast cancer Sister   . Dementia Sister   . Cataracts Sister   . Heart attack Brother   . CAD Brother   . Heart attack Brother   . Leukemia Grandchild        granddaughter  . Kidney disease Neg Hx   . Bladder Cancer Neg Hx     Social History:  reports that she quit smoking about 28 years ago. Her smoking use included cigarettes. She smoked 0.25 packs per day. She quit smokeless tobacco use about 28 years ago. She reports that she drinks about 14.0 standard drinks of alcohol per week. She reports that she does not use drugs.  ROS: UROLOGY Frequent Urination?: No Hard to postpone urination?: No Burning/pain with urination?: No Get up at night to urinate?: No Leakage of urine?: No Urine stream starts and stops?: No Trouble starting stream?: No Do you have to strain to urinate?: No Blood in urine?: No Urinary tract infection?: No Sexually transmitted disease?: No Injury to kidneys or bladder?: No Painful intercourse?: No Weak stream?: No Currently pregnant?: No Vaginal bleeding?: No Last menstrual period?: n  Gastrointestinal Nausea?: No Vomiting?: No Indigestion/heartburn?: No Diarrhea?: No Constipation?: Yes  Constitutional Fever: No Night sweats?: No Weight loss?: No Fatigue?: No  Skin Skin rash/lesions?: No Itching?: No  Eyes Blurred vision?: No Double vision?: No  Ears/Nose/Throat Sore throat?: No Sinus problems?: No  Hematologic/Lymphatic Swollen glands?: No Easy bruising?:  No  Cardiovascular Leg swelling?: No Chest pain?: No  Respiratory Cough?: No Shortness of breath?: No  Endocrine Excessive thirst?: No  Musculoskeletal Back pain?: No Joint pain?: No  Neurological Headaches?: No Dizziness?: No  Psychologic Depression?: No Anxiety?: No  Physical Exam: BP (!) 161/81 (BP Location: Left Arm, Patient Position: Sitting, Cuff Size: Normal)   Pulse 78   Ht 5\' 2"  (1.575 m)   Wt 147 lb 3.2 oz (66.8 kg)   BMI 26.92 kg/m   Constitutional: Well nourished. Alert and oriented, No acute distress. HEENT: [place] AT, moist mucus membranes. Trachea midline, no masses. Cardiovascular: No clubbing, cyanosis, or edema. Respiratory: Normal respiratory effort, no increased work of breathing. Skin: No rashes, bruises or suspicious lesions. Neurologic: Grossly intact, no focal deficits, moving all 4 extremities. Psychiatric: Normal mood and affect.  Laboratory Data:  Lab Results  Component Value Date   WBC 16.3 (H) 02/17/2017   HGB 13.7 02/17/2017   HCT 40.5 02/17/2017   MCV 96.9 02/17/2017   PLT 295 02/17/2017    Lab Results  Component Value Date   CREATININE 0.79 02/17/2017    No results found for: PSA  No results found for: TESTOSTERONE  No results found for: HGBA1C  Lab Results  Component Value Date   TSH 2.36 11/12/2016    No results found for: CHOL, HDL, CHOLHDL, VLDL, LDLCALC  Lab Results  Component Value Date   AST 23 02/17/2017   Lab Results  Component Value Date   ALT 19 02/17/2017   No components found for: ALKALINEPHOPHATASE No components found for: BILIRUBINTOTAL  No results found for: ESTRADIOL  I have reviewed the labs.   Pertinent Imaging: CLINICAL DATA:  Recurrent urinary tract infections.  EXAM: RENAL / URINARY TRACT ULTRASOUND COMPLETE  COMPARISON:  Pelvic ultrasound 07/17/2016.  FINDINGS: Right Kidney:  Renal measurements: 10.1 x 4.6 x 5.1 cm = volume: 126.0 mL . Echogenicity within normal  limits. No mass or hydronephrosis visualized.  Left Kidney:  Renal measurements: 11.7 x 5.2 x 5.7 cm = volume: 181.6 mL. There is moderately severe hydronephrosis. Cortical echogenicity is normal.  Bladder:  Appears normal for degree of bladder distention. Left ovarian cyst measuring 5.9 x 4.8 x 4.7 cm is identified and simple in appearance. The cyst is present on the prior ultrasound and has slightly increased in size.  IMPRESSION: Moderately severe left hydronephrosis. Cause for obstruction is not identified.  Simple left ovarian cyst is chronic.   Electronically Signed   By: Inge Rise M.D.   On: 12/04/2017 09:12 I have independently reviewed the films with the patient and her daughter and noted the hydronephrosis     Assessment & Plan:   1. Hydronephrosis - Explained findings to patient and her daughter, differential includes UPJ obstruction, ureteral stone or malignancy - Will pursue CT urogram at this time for further evaluation - I explained to the patient that a contrast material will be injected into a vein and that in rare instances, an allergic reaction can result and may even life threatening   The patient denies any allergies to contrast, iodine and/or seafood and is not taking metformin - BUN + creatinine   - RTC for CTU report                                Return for CTU report .  These notes generated with voice recognition software. I apologize for typographical errors.  Zara Council, [name]  [address]  [place], [place] [address] [phone]  I spent 25 with this patient and daughter in a face to face visit of which greater than 50% was spent in counseling and coordination of care with the patient regarding the differential diagnosis for the hydronephrosis and the next steps.

## 2017-12-31 LAB — BUN+CREAT
BUN/Creatinine Ratio: 14 (ref 12–28)
BUN: 14 mg/dL (ref 8–27)
Creatinine, Ser: 1.01 mg/dL — ABNORMAL HIGH (ref 0.57–1.00)
GFR calc Af Amer: 58 mL/min/{1.73_m2} — ABNORMAL LOW (ref >59)
GFR calc non Af Amer: 50 mL/min/{1.73_m2} — ABNORMAL LOW (ref >59)

## 2018-01-08 ENCOUNTER — Ambulatory Visit: Payer: Medicare Other

## 2018-01-09 ENCOUNTER — Ambulatory Visit
Admission: RE | Admit: 2018-01-09 | Discharge: 2018-01-09 | Disposition: A | Discharge status: Home / Self Care | Payer: Medicare Other | Source: Ambulatory Visit | Attending: Urology | Admitting: Urology

## 2018-01-09 DIAGNOSIS — N1339 Other hydronephrosis: Secondary | ICD-10-CM | POA: Diagnosis not present

## 2018-01-09 MED ORDER — IOPAMIDOL (ISOVUE-300) INJECTION 61%
100.0000 mL | Freq: Once | INTRAVENOUS | Status: AC | PRN
  Administered 2018-01-09: 100 mL via INTRAVENOUS

## 2018-01-14 ENCOUNTER — Ambulatory Visit (INDEPENDENT_AMBULATORY_CARE_PROVIDER_SITE_OTHER): Payer: Medicare Other | Admitting: Urology

## 2018-01-14 ENCOUNTER — Encounter

## 2018-01-14 ENCOUNTER — Encounter: Payer: Self-pay | Admitting: Urology

## 2018-01-14 VITALS — BP 160/81 | HR 86 | Ht 62.0 in | Wt 146.5 lb

## 2018-01-14 DIAGNOSIS — N952 Postmenopausal atrophic vaginitis: Secondary | ICD-10-CM

## 2018-01-14 DIAGNOSIS — N1339 Other hydronephrosis: Secondary | ICD-10-CM

## 2018-01-14 DIAGNOSIS — N390 Urinary tract infection, site not specified: Secondary | ICD-10-CM | POA: Diagnosis not present

## 2018-01-14 DIAGNOSIS — R319 Hematuria, unspecified: Secondary | ICD-10-CM | POA: Diagnosis not present

## 2018-01-14 DIAGNOSIS — R109 Unspecified abdominal pain: Secondary | ICD-10-CM | POA: Diagnosis not present

## 2018-01-14 LAB — MICROSCOPIC EXAMINATION: RBC, UA: NONE SEEN /hpf (ref 0–2)

## 2018-01-14 LAB — URINALYSIS, COMPLETE
Bilirubin, UA: NEGATIVE
Glucose, UA: NEGATIVE
Ketones, UA: NEGATIVE
Nitrite, UA: NEGATIVE
Protein, UA: NEGATIVE
RBC, UA: NEGATIVE
Specific Gravity, UA: 1.025 (ref 1.005–1.030)
Urobilinogen, Ur: 0.2 mg/dL (ref 0.2–1.0)
pH, UA: 5.5 (ref 5.0–7.5)

## 2018-01-14 NOTE — Progress Notes (Addendum)
01/14/2018 5:09 PM   [place] [birth year] [id number]  Referring provider: Birdie Sons, MD [address] [place], [place] [address]  Chief Complaint  Patient presents with  . Follow-up    HPI: Monica Singh is an 84 year old Caucasian female with a history of COPD, rUTI's, vaginal atrophy and left hydronephrosis who presents today for a CTU report with her daughters, [place] and [place].   Background history She was referred to [place] by Dr. Kirstie Peri. Fisher for recurrent urinary tract infections with her daughter, [name].  Patient stated that she has had five urinary tract infections over the last year. Reviewing her records, she has had 4 documented positive urine cultures. July 29, 2017+ for E. Coli Jun 18, 2017+ for Klebsiella oxytoca resistant to ampicillin and cefazolin February 10, 2017+ for Klebsiella pneumoniae resistant to ampicillin December 16, 2016+ for Providencia rettgeri resistant to Augmentin, cefazolin and nitrofurantoin Her symptoms with a urinary tract infection consist of heavy feeling down there, urgency and feelings of incomplete emptying.  She does/does not have a history of nephrolithiasis, GU surgery or GU trauma.  She is not sexually active.  She is postmenopausal.  She admits to constipation.  She does not engage in good perineal hygiene. She does not take tub baths.  She has SUI incontinence.  She is using incontinence pads, once daily.  She is not drinking a lot of water daily.  She drinks a cup and a half of coffee daily.  She may drink a soda once a week.  No tea.  No juices.  Two glasses of wine a night.   RUS on 12/03/2017 revealed moderately severe left hydronephrosis. Cause for obstruction is not identified.  Simple left ovarian cyst is chronic.  She stated she does have intermittent left-sided flank pain.  She stated it was not severe.  It is worsened by laying on her left side.  She stated this has been occurring for several  weeks.  She denied any symptoms of urinary tract infection at this time.  Patient denied any gross hematuria, dysuria or suprapubic/flank pain.  Patient deniedany fevers, chills, nausea or vomiting.   Today she reports of nocturia and leakage. She was accompanied by her two daughters. Pt reports of left flank pain and does not want to have anesthesia due her age and memory concerns.  Her UA shows 11-30 WBC and many bacteria, otherwise unremarkable.   CTU on 01/09/2018 revealed normal adrenal glands. The right kidney is unremarkable. There is marked left-sided hydronephrosis to the level of the UPJ which is Monica when compared with CT from 02/17/2017. No left-sided hydroureter. Tiny focus of enhancement at the UPJ is identified measuring 7 mm, image 119/15. Normal appearance of the right kidney. Urinary bladder is unremarkable.  Aortic Atherosclerosis.   Left ovary cyst measures 5.2 cm. This is almost certainly benign, but follow up ultrasound is recommended in 1 year according to the Society of Radiologists in [place] Statement (D Clovis Riley et al. Management of Asymptomatic Ovarian and Other Adnexal Cysts Imaged at [place]: Society of Radiologists in [place] Statement 2010. Radiology 256 (Sept 2010): 505-697.).  Scoliosis and degenerative disc disease. There is a chronic treated compression deformity involving the L1 vertebra.  Emphysema    PMH: Past Medical History:  Diagnosis Date  . Adnexal mass 06/03/2014   since 2009  . Arthritis   . 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 GERD (gastroesophageal reflux disease)   . Herpes zoster without complication 8/0/9983  . HTN (hypertension)   . Non Hodgkin's lymphoma ([place])   . Recurrent UTI     Surgical History: Past Surgical History:  Procedure Laterality Date  . ABDOMINAL SURGERY  2009   abdominal mass+ NH lymphoma,    . BACK SURGERY    . CATARACT EXTRACTION  1997   right eye  . cervical neck fusion  1995  . DENTAL SURGERY     screws  . KYPHOPLASTY N/A 02/21/2017   Procedure: [id number];  Surgeon: Hessie Knows, MD;  Location: [facility] ORS;  Service: Orthopedics;  Laterality: N/A;  . laparotomy with biopsy  03/02/2007  . PORTACATH PLACEMENT  2009  . [place]  . SQUAMOUS CELL CARCINOMA EXCISION     right arm  . TOTAL KNEE ARTHROPLASTY Right   . VAGINAL HYSTERECTOMY  1971    Home Medications:  Allergies as of 01/14/2018      Reactions   Lac Bovis Diarrhea, Nausea And Vomiting   Milk-related Compounds Diarrhea, Nausea And Vomiting   Sulfa Antibiotics Hives, Itching      Medication List       Accurate as of January 14, 2018 11:59 PM. Always use your most recent med list.        ALIGN 4 MG Caps Take 1 capsule by mouth daily.   COMBIVENT RESPIMAT 20-100 MCG/ACT Aers respimat Generic drug:  Ipratropium-Albuterol INHALE 2 PUFFS INTO THE LUNGS 3 TIMES DAILY   conjugated estrogens vaginal cream Commonly known as:  PREMARIN 1 applicator every night for 7 nights, then twice a week.   fluticasone 50 MCG/ACT nasal spray Commonly known as:  FLONASE Place 2 sprays into both nostrils daily.   omeprazole 20 MG capsule Commonly known as:  PRILOSEC Take 20 mg by mouth daily.   risedronate 35 MG tablet Commonly known as:  ACTONEL TAKE 1 TABLET BY MOUTH EVERY SUNDAY AS DIRECTED       Allergies:  Allergies  Allergen Reactions  . Lac Bovis Diarrhea and Nausea And Vomiting  . Milk-Related Compounds Diarrhea and Nausea And Vomiting  . Sulfa Antibiotics Hives and Itching    Family History: Family History  Problem Relation Age of Onset  . Breast cancer Sister   . Dementia Sister   . Cataracts Sister   . Heart attack Brother   . CAD Brother   . Heart attack Brother   . Leukemia Grandchild        granddaughter  . Kidney disease Neg Hx   . Bladder Cancer Neg Hx      Social History:  reports that she quit smoking about 28 years ago. Her smoking use included cigarettes. She smoked 0.25 packs per day. She quit smokeless tobacco use about 28 years ago. She reports current alcohol use of about 14.0 standard drinks of alcohol per week. She reports that she does not use drugs.  ROS: UROLOGY Frequent Urination?: No Hard to postpone urination?: No Burning/pain with urination?: No Get up at night to urinate?: Yes Leakage of urine?: Yes Urine stream starts and stops?: No Trouble starting stream?: No Do you have to strain to urinate?: No Blood in urine?: No Urinary tract infection?: No Sexually transmitted disease?: No Injury to kidneys or bladder?: No Painful intercourse?: No Weak stream?: No Currently pregnant?: No Vaginal bleeding?: No Last menstrual period?: n  Gastrointestinal Nausea?: No Vomiting?: No Indigestion/heartburn?: No  Diarrhea?: No Constipation?: No  Constitutional Fever: No Night sweats?: No Weight loss?: No Fatigue?: No  Skin Skin rash/lesions?: No Itching?: No  Eyes Blurred vision?: No Double vision?: No  Ears/Nose/Throat Sore throat?: No Sinus problems?: No  Hematologic/Lymphatic Swollen glands?: No Easy bruising?: No  Cardiovascular Leg swelling?: No Chest pain?: No  Respiratory Cough?: No Shortness of breath?: No  Endocrine Excessive thirst?: No  Musculoskeletal Back pain?: Yes Joint pain?: Yes  Neurological Headaches?: No Dizziness?: No  Psychologic Depression?: No Anxiety?: No  Physical Exam: BP (!) 160/81 (BP Location: Left Arm, Patient Position: Sitting, Cuff Size: Normal)   Pulse 86   Ht 5\' 2"  (1.575 m)   Wt 146 lb 8 oz (66.5 kg)   BMI 26.80 kg/m   Constitutional:  Well nourished. Alert and oriented, No acute distress. HEENT: [place] AT, moist mucus membranes.  Trachea midline, no masses. Cardiovascular: No clubbing, cyanosis, or edema. Respiratory: Normal respiratory effort, no  increased work of breathing. GU: No CVA tenderness.   Skin: No rashes, bruises or suspicious lesions. Neurologic: Grossly intact, no focal deficits, moving all 4 extremities. Psychiatric: Normal mood and affect.   Laboratory Data: Lab Results  Component Value Date   CREATININE 1.01 (H) 12/30/2017   Urinalysis 11-30 WBC's and many bacteria.   See Epic.  I have reviewed the labs.  Pertinent Imaging: CLINICAL DATA:  Left flank pain.  EXAM: CT ABDOMEN AND PELVIS WITHOUT AND WITH CONTRAST  TECHNIQUE: Multidetector CT imaging of the abdomen and pelvis was performed following the standard protocol before and following the bolus administration of intravenous contrast.  CONTRAST:  149mL ISOVUE-300 IOPAMIDOL (ISOVUE-300) INJECTION 61%  COMPARISON:  02/17/2017  FINDINGS: Lower chest: Moderate size hiatal hernia. Smoking related changes with moderate emphysema identified within both lungs. No acute abnormality identified.  Hepatobiliary: No focal liver abnormality. No gallstones, gallbladder wall thickening or biliary ductal dilatation.  Pancreas: Unremarkable. No pancreatic ductal dilatation or surrounding inflammatory changes.  Spleen: Normal in size without focal abnormality.  Adrenals/Urinary Tract: Normal adrenal glands. The right kidney is unremarkable. There is marked left-sided hydronephrosis to the level of the UPJ which is Monica when compared with CT from 02/17/2017. No left-sided hydroureter. Tiny focus of enhancement at the UPJ is identified measuring 7 mm, image 119/15. Normal appearance of the right kidney. Urinary bladder is unremarkable.  Stomach/Bowel: Hiatal hernia. Small bowel loops are unremarkable. No dilated loops of colon. Distal colonic diverticula noted without acute inflammation.  Vascular/Lymphatic: Aortic atherosclerosis without aneurysm. No abdominopelvic adenopathy identified.  Reproductive: Status post hysterectomy. Left ovary cyst  measures 5.2 cm and contains a small peripheral area of calcification.  Other: No free fluid or fluid collections.  Musculoskeletal: Scoliosis and degenerative disc disease. There is a chronic compression deformity involving the L1 vertebra which is been treated with bone cement. Degenerative disc disease identified at L3-4 and L5-S1.  IMPRESSION: 1. Marked left-sided hydronephrosis to the level of the left UPJ. No renal calculi identified. Findings may represent a benign or malignant stricture. A small urothelial lesion is not excluded. 2.  Aortic Atherosclerosis (ICD10-I70.0). 3. Left ovary cyst measures 5.2 cm. This is almost certainly benign, but follow up ultrasound is recommended in 1 year according to the Society of Radiologists in [place] Statement (D Clovis Riley et al. Management of Asymptomatic Ovarian and Other Adnexal Cysts Imaged at [place]: Society of Radiologists in [place] Statement 2010. Radiology 256 (Sept 2010): 943-954.). 4. Scoliosis and degenerative disc disease. There is a chronic  treated compression deformity involving the L1 vertebra. 5.  Emphysema (ICD10-J43.9).   Electronically Signed   By: Kerby Moors M.D.   On: 01/09/2018 13:11  I have reviewed the CTU personally and with the pt. And daughters and appreciate the left hydronephrosis and adnexal mass.   Assessment & Plan:   1. Hydronephrosis -CTU shows left-sided hydronephrosis to the level of the left UPJ. -Explained findings of the CTU report to patient and her daughters, differential includes UPJ obstruction, ureteral stone or malignancy       -Recommendations at this time are to pursue a cystoscopy with a diagnostic ureteroscopy with possible biopsies and then a ureteral stent placement                      -Risks and benefits of ureteroscopy were reviewed including but not limited to infection, bleeding, pain, ureteral injury which could require  open surgery versus prolonged indwelling if ureteral perforation occurs, requirement for staged procedure and global anesthesia risks. Patient expressed understanding and does not desire to proceed with ureteroscopy. -Discussed the risk of not undergoing the procedure with the patient and daughter such as loss of the left kidney, a possible undiagnosed malignancy that would continue to grow and likely metastasize and sepsis if the urine trapped within the obstruction becomes infected -she understands these risks and does not want to proceed - her daughters are not in agreement -We will send the urine for cytology and this point to see if there is evidence of a malignancy therefore giving the patient and her daughters more information before proceeding with the procedure, if the cytology is negative, I did remind them that the risks are still present -If patient is still hesitant to undergo the procedure, we may place her on a low-dose prophylactic antibiotic in an effort to stave off any sepsis in the future if she ultimately decides not to go forward with the procedure  2. rUTI's -Continue preventative strategies -Asked to contact the office with signs or symptoms of an urinary tract infection  3.  Vaginal atrophy -Continue vaginal estrogen cream 3 nights weekly  4. Left flank pain -Explained that the flank pain may may be muscle skeletal in nature and that undergoing the above proposed procedure may not result in the alation of the left-sided flank pain  Return for urine cytology report pending.  These notes generated with voice recognition software. I apologize for typographical errors.  Zara Council, [name]  [facility] [address]  [place], [place] [address] [phone]  I, Lucas Mallow, am acting as a Education administrator for [employer],  I have reviewed the above documentation for accuracy and completeness, and I agree with the above.     Zara Council, [name]

## 2018-01-18 LAB — CULTURE, URINE COMPREHENSIVE

## 2018-01-19 ENCOUNTER — Other Ambulatory Visit: Payer: Self-pay | Admitting: Urology

## 2018-01-21 ENCOUNTER — Telehealth: Payer: Self-pay | Admitting: Urology

## 2018-01-21 NOTE — Telephone Encounter (Signed)
Please notify Monica Singh's daughter, Monica Singh, that her urine cytology was negative for cancerous cells.  At this time, they may choose to continue further evaluation with the procedure we discussed (URS, biopsies and stent placement) or start a low dose prophylactic antibiotic.  If they would like to start the low dose antibiotic, I would start Macrobid 100 mg daily.  We would then need to follow up in one month and for any break through infections.

## 2018-01-22 NOTE — Telephone Encounter (Signed)
Patient's daughter called back stating that patient would like to go ahead with surgery. Please schedule after first week in January thanks

## 2018-01-22 NOTE — Telephone Encounter (Signed)
Patient's daughter notified and states that she will discuss with patient and call back for follow up decision

## 2018-01-23 ENCOUNTER — Other Ambulatory Visit: Payer: Self-pay | Admitting: Radiology

## 2018-01-23 DIAGNOSIS — N135 Crossing vessel and stricture of ureter without hydronephrosis: Secondary | ICD-10-CM

## 2018-01-23 NOTE — Telephone Encounter (Signed)
Discussed   [facility] Information form below Over the phone with daughter.  [place], [place], [place] [address] Telephone: [phone] Fax: [phone]   Thank you for choosing [facility] for your upcoming surgery!  We are always here to assist in your urological needs.  Please read the following information with specific details for your upcoming appointments related to your surgery. Please contact Amy at [phone] Option 3 with any questions.  The Name of Your Surgery: Cysto Left [place] Ureteral Stent Placement  Your Surgery Date: 01/30/2018 Your Surgeon: Nickolas Madrid  Please call Same Day Surgery at [phone] between the hours of 1pm-3pm one day prior to your surgery. They will inform you of the time to arrive at Same Day Surgery which is located on the second floor of the [facility].     You will receive a call from the [place] office regarding your appointment with them.  The Pre-Admission Testing office is located at [place], on the first floor of the [place] [facility] in [place] (office is to the right as you enter through the [employer] of the [facility]). Please have all medications you are currently taking and your insurance card available.   Daughter was advised for patient to have nothing to eat or drink after midnight the night prior to surgery except that she may have only water until 2 hours before surgery with nothing to drink within 2 hours of surgery.  Daughter  states patient currently takes No blood thinners. Daughter's  questions were answered and she expressed understanding of these instructions.

## 2018-01-26 ENCOUNTER — Other Ambulatory Visit: Payer: Self-pay

## 2018-01-26 ENCOUNTER — Ambulatory Visit
Admission: RE | Admit: 2018-01-26 | Discharge: 2018-01-26 | Disposition: A | Discharge status: Home / Self Care | Payer: Medicare Other | Source: Ambulatory Visit | Attending: Surgery | Admitting: Surgery

## 2018-01-26 HISTORY — DX: Dyspnea, unspecified: R06.00

## 2018-01-26 HISTORY — DX: Personal history of other diseases of the digestive system: Z87.19

## 2018-01-26 HISTORY — DX: Unspecified hearing loss, unspecified ear: H91.90

## 2018-01-26 HISTORY — DX: Personal history of urinary calculi: Z87.442

## 2018-01-26 NOTE — Patient Instructions (Signed)
Your procedure is scheduled on: Friday, January 3RD, 2020  Report to [facility]     DO NOT STOP ON THE FIRST FLOOR TO REGISTER  To find out your arrival time please call [phone] between 1PM - 3PM on Thursday, January 2ND  Remember: Instructions that are not followed completely may result in serious medical risk,  up to and including death, or upon the discretion of your surgeon and anesthesiologist your  surgery may need to be rescheduled.     _X__ 1. Do not eat food after midnight the night before your procedure.                 No gum chewing or hard candies.                    ABSOLUTELY NOTHING SOLID IN YOUR MOUTH AFTER MIDNIGHT                 You may drink clear liquids up to 2 hours before you are scheduled to arrive for your surgery-                  DO not drink clear liquids within 2 hours of the start of your surgery.                  Clear Liquids include:  water, apple juice without pulp, clear carbohydrate                 drink such as Clearfast of Gatorade, Black Coffee or Tea (Do not add                 anything to coffee or tea).  __X__2.  On the morning of surgery brush your teeth with toothpaste and water,                  You may rinse your mouth with mouthwash if you wish.                     Do not swallow any toothpaste of mouthwash.     _X__ 3.  No Alcohol for 24 hours before or after surgery.   _X__ 4.  Do Not Smoke or use e-cigarettes For 24 Hours Prior to Your Surgery.                 Do not use any chewable tobacco products for at least 6 hours prior to                 surgery.  ____  5.  Bring all medications with you on the day of surgery if instructed.   _X___  6.  Notify your doctor if there is any change in your medical condition      (cold, fever, infections).     Do not wear jewelry, make-up, hairpins, clips or nail polish. Do not wear lotions, powders, or perfumes. You may wear  deodorant. Do not shave 48 hours prior to surgery. Men may shave face and neck. Do not bring valuables to the hospital.    [facility] is not responsible for any belongings or valuables.  Contacts, dentures or bridgework may not be worn into surgery. Leave your suitcase in the car. After surgery it may be brought to your room. For patients admitted to the hospital, discharge time is determined by your treatment team.   Patients discharged the day of surgery will not  be allowed to drive home.   Please read over the following fact sheets that you were given:   PREPARING FOR SURGERY    ____ Take these medicines the morning of surgery with A SIP OF WATER:    1. PRILOSEC  2. FLONASE, IF USING THIS  3. COMBIVENT INHALER  4.   ____ Fleet Enema (as directed)   _X___ Use ANTIBACTERIAL SOAP ON THE MORNING OF SURGERY  _X___ Use inhalers on the day of surgery. PLEASE BRING THIS WITH YOU                  TO THE HOSPITAL ON THE MORNING OF SURGERY  __X__ Stop ALL ASPIRIN PRODUCTS TODAY  __X__ Stop Anti-inflammatories AS OF TODAY.   ____ Stop supplements until after surgery.    ____ Bring C-Pap to the hospital.   WEAR SOMETHING LOOSE AND COMFORTABLE TO [place]     ON THE DAY OF SURGERY

## 2018-01-29 ENCOUNTER — Ambulatory Visit: Payer: Medicare Other | Admitting: Urology

## 2018-01-29 MED ORDER — CEFAZOLIN SODIUM-DEXTROSE 1-4 GM/50ML-% IV SOLN
1.0000 g | INTRAVENOUS | Status: AC
  Administered 2018-01-30: 1 g via INTRAVENOUS

## 2018-01-30 ENCOUNTER — Ambulatory Visit: Payer: Medicare Other | Admitting: Anesthesiology

## 2018-01-30 ENCOUNTER — Other Ambulatory Visit: Payer: Self-pay

## 2018-01-30 ENCOUNTER — Ambulatory Visit
Admission: RE | Admit: 2018-01-30 | Discharge: 2018-01-30 | Disposition: A | Discharge status: Home / Self Care | Payer: Medicare Other | Attending: Urology | Admitting: Urology

## 2018-01-30 ENCOUNTER — Encounter
Admission: RE | Disposition: A | Discharge status: Home / Self Care | Payer: Self-pay | Source: Home / Self Care | Attending: Urology

## 2018-01-30 DIAGNOSIS — E039 Hypothyroidism, unspecified: Secondary | ICD-10-CM | POA: Insufficient documentation

## 2018-01-30 DIAGNOSIS — Z90710 Acquired absence of both cervix and uterus: Secondary | ICD-10-CM | POA: Diagnosis not present

## 2018-01-30 DIAGNOSIS — Z85828 Personal history of other malignant neoplasm of skin: Secondary | ICD-10-CM | POA: Diagnosis not present

## 2018-01-30 DIAGNOSIS — C8590 Non-Hodgkin lymphoma, unspecified, unspecified site: Secondary | ICD-10-CM | POA: Diagnosis not present

## 2018-01-30 DIAGNOSIS — Z01810 Encounter for preprocedural cardiovascular examination: Secondary | ICD-10-CM | POA: Diagnosis not present

## 2018-01-30 DIAGNOSIS — Z87891 Personal history of nicotine dependence: Secondary | ICD-10-CM | POA: Insufficient documentation

## 2018-01-30 DIAGNOSIS — K449 Diaphragmatic hernia without obstruction or gangrene: Secondary | ICD-10-CM | POA: Diagnosis not present

## 2018-01-30 DIAGNOSIS — Z96652 Presence of left artificial knee joint: Secondary | ICD-10-CM | POA: Diagnosis not present

## 2018-01-30 DIAGNOSIS — K219 Gastro-esophageal reflux disease without esophagitis: Secondary | ICD-10-CM | POA: Diagnosis not present

## 2018-01-30 DIAGNOSIS — H9190 Unspecified hearing loss, unspecified ear: Secondary | ICD-10-CM | POA: Insufficient documentation

## 2018-01-30 DIAGNOSIS — Z87440 Personal history of urinary (tract) infections: Secondary | ICD-10-CM | POA: Insufficient documentation

## 2018-01-30 DIAGNOSIS — Z9842 Cataract extraction status, left eye: Secondary | ICD-10-CM | POA: Diagnosis not present

## 2018-01-30 DIAGNOSIS — J449 Chronic obstructive pulmonary disease, unspecified: Secondary | ICD-10-CM | POA: Diagnosis not present

## 2018-01-30 DIAGNOSIS — N135 Crossing vessel and stricture of ureter without hydronephrosis: Secondary | ICD-10-CM | POA: Diagnosis not present

## 2018-01-30 DIAGNOSIS — I10 Essential (primary) hypertension: Secondary | ICD-10-CM | POA: Diagnosis not present

## 2018-01-30 DIAGNOSIS — Z9841 Cataract extraction status, right eye: Secondary | ICD-10-CM | POA: Diagnosis not present

## 2018-01-30 DIAGNOSIS — Z87442 Personal history of urinary calculi: Secondary | ICD-10-CM | POA: Insufficient documentation

## 2018-01-30 DIAGNOSIS — N1330 Unspecified hydronephrosis: Secondary | ICD-10-CM | POA: Insufficient documentation

## 2018-01-30 HISTORY — PX: CYSTOSCOPY W/ RETROGRADES: SHX1426

## 2018-01-30 HISTORY — PX: URETEROSCOPY: SHX842

## 2018-01-30 HISTORY — PX: CYSTOSCOPY WITH STENT PLACEMENT: SHX5790

## 2018-01-30 LAB — CBC
HCT: 39.5 % (ref 36.0–46.0)
Hemoglobin: 12.9 g/dL (ref 12.0–15.0)
MCH: 31.3 pg (ref 26.0–34.0)
MCHC: 32.7 g/dL (ref 30.0–36.0)
MCV: 95.9 fL (ref 80.0–100.0)
Platelets: 307 10*3/uL (ref 150–400)
RBC: 4.12 MIL/uL (ref 3.87–5.11)
RDW: 13.2 % (ref 11.5–15.5)
WBC: 5.4 10*3/uL (ref 4.0–10.5)
nRBC: 0 % (ref 0.0–0.2)

## 2018-01-30 LAB — BASIC METABOLIC PANEL
Anion gap: 8 (ref 5–15)
BUN: 13 mg/dL (ref 8–23)
CO2: 26 mmol/L (ref 22–32)
Calcium: 8.8 mg/dL — ABNORMAL LOW (ref 8.9–10.3)
Chloride: 100 mmol/L (ref 98–111)
Creatinine, Ser: 1.12 mg/dL — ABNORMAL HIGH (ref 0.44–1.00)
GFR calc Af Amer: 51 mL/min — ABNORMAL LOW (ref >60)
GFR calc non Af Amer: 44 mL/min — ABNORMAL LOW (ref >60)
Glucose, Bld: 109 mg/dL — ABNORMAL HIGH (ref 70–99)
Potassium: 3.7 mmol/L (ref 3.5–5.1)
Sodium: 134 mmol/L — ABNORMAL LOW (ref 135–145)

## 2018-01-30 SURGERY — URETEROSCOPY
Anesthesia: General | Site: Ureter | Laterality: Left

## 2018-01-30 MED ORDER — ONDANSETRON HCL 4 MG/2ML IJ SOLN
INTRAMUSCULAR | Status: AC
  Filled 2018-01-30: qty 2

## 2018-01-30 MED ORDER — ACETAMINOPHEN 325 MG PO TABS
ORAL_TABLET | ORAL | Status: AC
  Filled 2018-01-30: qty 2

## 2018-01-30 MED ORDER — PROPOFOL 10 MG/ML IV BOLUS
INTRAVENOUS | Status: AC
  Filled 2018-01-30: qty 20

## 2018-01-30 MED ORDER — FAMOTIDINE 20 MG PO TABS
ORAL_TABLET | ORAL | Status: AC
  Filled 2018-01-30: qty 1

## 2018-01-30 MED ORDER — DEXAMETHASONE SODIUM PHOSPHATE 10 MG/ML IJ SOLN
INTRAMUSCULAR | Status: AC
  Filled 2018-01-30: qty 1

## 2018-01-30 MED ORDER — ACETAMINOPHEN 325 MG PO TABS
650.0000 mg | ORAL_TABLET | Freq: Once | ORAL | Status: AC
  Administered 2018-01-30: 650 mg via ORAL

## 2018-01-30 MED ORDER — NITROFURANTOIN MACROCRYSTAL 100 MG PO CAPS: 100.0000 mg | ORAL_CAPSULE | Freq: Every day | ORAL | 0 refills | Status: DC

## 2018-01-30 MED ORDER — PHENYLEPHRINE HCL 10 MG/ML IJ SOLN
INTRAMUSCULAR | Status: AC
  Filled 2018-01-30: qty 1

## 2018-01-30 MED ORDER — FENTANYL CITRATE (PF) 100 MCG/2ML IJ SOLN
INTRAMUSCULAR | Status: AC
  Filled 2018-01-30: qty 2

## 2018-01-30 MED ORDER — LIDOCAINE HCL (PF) 2 % IJ SOLN
INTRAMUSCULAR | Status: AC
  Filled 2018-01-30: qty 10

## 2018-01-30 MED ORDER — ROCURONIUM BROMIDE 100 MG/10ML IV SOLN
INTRAVENOUS | Status: DC | PRN
  Administered 2018-01-30: 50 mg via INTRAVENOUS

## 2018-01-30 MED ORDER — ROCURONIUM BROMIDE 50 MG/5ML IV SOLN
INTRAVENOUS | Status: AC
  Filled 2018-01-30: qty 1

## 2018-01-30 MED ORDER — ONDANSETRON HCL 4 MG/2ML IJ SOLN
INTRAMUSCULAR | Status: DC | PRN
  Administered 2018-01-30: 4 mg via INTRAVENOUS

## 2018-01-30 MED ORDER — FAMOTIDINE 20 MG PO TABS
20.0000 mg | ORAL_TABLET | Freq: Once | ORAL | Status: AC
  Administered 2018-01-30: 20 mg via ORAL

## 2018-01-30 MED ORDER — FENTANYL CITRATE (PF) 100 MCG/2ML IJ SOLN: 25.0000 ug | INTRAMUSCULAR | Status: DC | PRN

## 2018-01-30 MED ORDER — IOTHALAMATE MEGLUMINE 43 % IV SOLN
INTRAVENOUS | Status: DC | PRN
  Administered 2018-01-30: 12 mL via URETHRAL

## 2018-01-30 MED ORDER — CEFAZOLIN SODIUM-DEXTROSE 2-4 GM/100ML-% IV SOLN
INTRAVENOUS | Status: AC
  Filled 2018-01-30: qty 100

## 2018-01-30 MED ORDER — FENTANYL CITRATE (PF) 100 MCG/2ML IJ SOLN
INTRAMUSCULAR | Status: DC | PRN
  Administered 2018-01-30 (×2): 50 ug via INTRAVENOUS

## 2018-01-30 MED ORDER — LIDOCAINE HCL (CARDIAC) PF 100 MG/5ML IV SOSY
PREFILLED_SYRINGE | INTRAVENOUS | Status: DC | PRN
  Administered 2018-01-30: 60 mg via INTRAVENOUS

## 2018-01-30 MED ORDER — LACTATED RINGERS IV SOLN
INTRAVENOUS | Status: DC
  Administered 2018-01-30: 07:00:00 via INTRAVENOUS

## 2018-01-30 MED ORDER — SUGAMMADEX SODIUM 500 MG/5ML IV SOLN
INTRAVENOUS | Status: DC | PRN
  Administered 2018-01-30: 130 mg via INTRAVENOUS

## 2018-01-30 MED ORDER — CEFAZOLIN SODIUM-DEXTROSE 1-4 GM/50ML-% IV SOLN
INTRAVENOUS | Status: AC
  Filled 2018-01-30: qty 50

## 2018-01-30 MED ORDER — PROPOFOL 10 MG/ML IV BOLUS
INTRAVENOUS | Status: DC | PRN
  Administered 2018-01-30: 120 mg via INTRAVENOUS

## 2018-01-30 MED ORDER — DEXAMETHASONE SODIUM PHOSPHATE 10 MG/ML IJ SOLN
INTRAMUSCULAR | Status: DC | PRN
  Administered 2018-01-30: 5 mg via INTRAVENOUS

## 2018-01-30 SURGICAL SUPPLY — 38 items
BAG DRAIN CYSTO-URO LG1000N (MISCELLANEOUS) ×4 IMPLANT
BRUSH SCRUB EZ  4% CHG (MISCELLANEOUS) ×2
BRUSH SCRUB EZ 1% IODOPHOR (MISCELLANEOUS) ×1 IMPLANT
BRUSH SCRUB EZ 4% CHG (MISCELLANEOUS) ×2 IMPLANT
BULB IRRIG PATHFIND (MISCELLANEOUS) IMPLANT
CATH URETL 5X70 OPEN END (CATHETERS) ×4 IMPLANT
CNTNR SPEC 2.5X3XGRAD LEK (MISCELLANEOUS) ×2
CONRAY 43 FOR UROLOGY 50M (MISCELLANEOUS) ×1 IMPLANT
CONT SPEC 4OZ STER OR WHT (MISCELLANEOUS) ×2
CONTAINER SPEC 2.5X3XGRAD LEK (MISCELLANEOUS) ×2 IMPLANT
DRSG TELFA 4X3 1S NADH ST (GAUZE/BANDAGES/DRESSINGS) ×4 IMPLANT
ELECT REM PT RETURN 9FT ADLT (ELECTROSURGICAL) ×4
ELECTRODE REM PT RTRN 9FT ADLT (ELECTROSURGICAL) ×2 IMPLANT
GLOVE BIOGEL PI IND STRL 7.5 (GLOVE) ×2 IMPLANT
GLOVE BIOGEL PI INDICATOR 7.5 (GLOVE) ×2
GOWN STRL REUS W/ TWL LRG LVL3 (GOWN DISPOSABLE) ×2 IMPLANT
GOWN STRL REUS W/ TWL XL LVL3 (GOWN DISPOSABLE) ×2 IMPLANT
GOWN STRL REUS W/TWL LRG LVL3 (GOWN DISPOSABLE) ×2
GOWN STRL REUS W/TWL XL LVL3 (GOWN DISPOSABLE) ×2
GUIDEWIRE GREEN .038 145CM (MISCELLANEOUS) ×1 IMPLANT
INFUSOR MANOMETER BAG 3000ML (MISCELLANEOUS) ×4 IMPLANT
INTRODUCER DILATOR DOUBLE (INTRODUCER) ×3 IMPLANT
KIT TURNOVER CYSTO (KITS) ×4 IMPLANT
PACK CYSTO AR (MISCELLANEOUS) ×4 IMPLANT
SENSORWIRE 0.038 NOT ANGLED (WIRE) ×8
SET CYSTO W/LG BORE CLAMP LF (SET/KITS/TRAYS/PACK) ×4 IMPLANT
SHEATH URETERAL 12FRX35CM (MISCELLANEOUS) IMPLANT
SOL .9 NS 3000ML IRR  AL (IV SOLUTION)
SOL .9 NS 3000ML IRR UROMATIC (IV SOLUTION) ×1 IMPLANT
STENT URET 6FRX24 CONTOUR (STENTS) ×3 IMPLANT
STENT URET 6FRX26 CONTOUR (STENTS) IMPLANT
SURGILUBE 2OZ TUBE FLIPTOP (MISCELLANEOUS) ×4 IMPLANT
SYRINGE IRR TOOMEY STRL 70CC (SYRINGE) IMPLANT
TUBING ART PRESS 48 MALE/FEM (TUBING) IMPLANT
VALVE UROSEAL ADJ ENDO (VALVE) ×3 IMPLANT
WATER STERILE IRR 1000ML POUR (IV SOLUTION) ×4 IMPLANT
WATER STERILE IRR 3000ML UROMA (IV SOLUTION) ×4 IMPLANT
WIRE SENSOR 0.038 NOT ANGLED (WIRE) ×3 IMPLANT

## 2018-01-30 NOTE — Progress Notes (Signed)
Uvula slightly swollen   Eating ice

## 2018-01-30 NOTE — Discharge Instructions (Signed)
AMBULATORY SURGERY  °DISCHARGE INSTRUCTIONS ° ° °1) The drugs that you were given will stay in your system until tomorrow so for the next 24 hours you should not: ° °A) Drive an automobile °B) Make any legal decisions °C) Drink any alcoholic beverage ° ° °2) You may resume regular meals tomorrow.  Today it is better to start with liquids and gradually work up to solid foods. ° °You may eat anything you prefer, but it is better to start with liquids, then soup and crackers, and gradually work up to solid foods. ° ° °3) Please notify your doctor immediately if you have any unusual bleeding, trouble breathing, redness and pain at the surgery site, drainage, fever, or pain not relieved by medication. ° ° ° °4) Additional Instructions: ° ° ° ° ° ° ° °Please contact your physician with any problems or Same Day Surgery at [phone], Monday through Friday 6 am to 4 pm, or [place] at [address] Main number at [phone]. °

## 2018-01-30 NOTE — Anesthesia Post-op Follow-up Note (Signed)
Anesthesia QCDR form completed.        

## 2018-01-30 NOTE — Anesthesia Procedure Notes (Deleted)
Performed by: Dallie Patton Ben, CRNA       

## 2018-01-30 NOTE — Transfer of Care (Signed)
Immediate Anesthesia Transfer of Care Note  Patient: [place]  Procedure(s) Performed: URETEROSCOPY (Left Ureter) CYSTOSCOPY WITH STENT PLACEMENT (Left Ureter) CYSTOSCOPY WITH RETROGRADE PYELOGRAM (Left Ureter)  Patient Location: PACU  Anesthesia Type:General  Level of Consciousness: awake, alert  and oriented  Airway & Oxygen Therapy: Patient Spontanous Breathing and Patient connected to face mask oxygen  Post-op Assessment: Report given to RN  Post vital signs: Reviewed and stable  Last Vitals:  Vitals Value Taken Time  BP 142/80 01/30/2018  8:30 AM  Temp    Pulse 94 01/30/2018  8:31 AM  Resp 19 01/30/2018  8:32 AM  SpO2 100 % 01/30/2018  8:31 AM  Vitals shown include unvalidated device data.  Last Pain:  Vitals:   01/30/18 0608  TempSrc: Oral  PainSc: 0-No pain         Complications: No apparent anesthesia complications

## 2018-01-30 NOTE — OR Nursing (Signed)
Anesthesia reviewed am labs and EKG.  No new orders.

## 2018-01-30 NOTE — H&P (Signed)
UROLOGY H&P UPDATE  Agree with prior H&P dated 12/18. 85 yo F with recurrent UTIs and left flank pain, new severe left hydronephrosis on CT with possible enhancing mass at UPJ.  Cardiac: RRR Lungs: CTA bilaterally  Laterality: left Procedure: cystoscopy, left retrograde pyelogram, left diagnostic ureteroscopy, biopsy, stent placement  Urinalysis: urine cx no growth 12/18  Informed consent obtained, we specifically discussed the risks of bleeding, infection, ureteral injury, need for additional procedures, possible staged procedure if unable to access collecting system, further treatments based on findings and pathology, and stent related symptoms.  Billey Co, MD 01/30/2018

## 2018-01-30 NOTE — Anesthesia Procedure Notes (Signed)
Procedure Name: Intubation Date/Time: 01/30/2018 8:54 AM Performed by: Fredderick Phenix, CRNA Pre-anesthesia Checklist: Patient identified, Emergency Drugs available, Suction available, Patient being monitored and Timeout performed Patient Re-evaluated:Patient Re-evaluated prior to induction Oxygen Delivery Method: Circle system utilized Induction Type: IV induction Ventilation: Mask ventilation without difficulty Laryngoscope Size: 3 and Mac Grade View: Grade I Tube type: Oral Tube size: 7.0 mm Number of attempts: 1 Airway Equipment and Method: Stylet Placement Confirmation: ETT inserted through vocal cords under direct vision,  positive ETCO2,  CO2 detector and breath sounds checked- equal and bilateral Secured at: 21 cm Tube secured with: Tape

## 2018-01-30 NOTE — Progress Notes (Signed)
Dr fitzgerald into check pt throat   No new orders

## 2018-01-30 NOTE — Anesthesia Postprocedure Evaluation (Signed)
Anesthesia Post Note  Patient: Monica Singh  Procedure(s) Performed: URETEROSCOPY (Left Ureter) CYSTOSCOPY WITH STENT PLACEMENT (Left Ureter) CYSTOSCOPY WITH RETROGRADE PYELOGRAM (Left Ureter)  Patient location during evaluation: PACU Anesthesia Type: General Level of consciousness: awake and alert Pain management: pain level controlled Vital Signs Assessment: post-procedure vital signs reviewed and stable Respiratory status: spontaneous breathing, nonlabored ventilation, respiratory function stable and patient connected to nasal cannula oxygen Cardiovascular status: blood pressure returned to baseline and stable Postop Assessment: no apparent nausea or vomiting Anesthetic complications: no     Last Vitals:  Vitals:   01/30/18 0925 01/30/18 0949  BP: (!) 165/85 (!) 142/63  Pulse: 87 74  Resp: 18 18  Temp: (!) 36.1 C   SpO2: 96% 100%    Last Pain:  Vitals:   01/30/18 0925  TempSrc: Temporal  PainSc: 0-No pain                 Durenda Hurt

## 2018-01-30 NOTE — Anesthesia Preprocedure Evaluation (Addendum)
Anesthesia Evaluation  Patient identified by MRN, date of birth, ID band Patient awake    Reviewed: Allergy & Precautions, H&P , NPO status , Patient's Chart, lab work & pertinent test results  Airway Mallampati: II  TM Distance: >3 FB     Dental  (+) Upper Dentures, Poor Dentition, Missing   Pulmonary shortness of breath, COPD, neg recent URI, former smoker,           Cardiovascular hypertension, (-) angina(-) Past MI and (-) Cardiac Stents negative cardio ROS  (-) dysrhythmias      Neuro/Psych negative neurological ROS  negative psych ROS   GI/Hepatic Neg liver ROS, hiatal hernia, GERD  ,  Endo/Other  Hypothyroidism   Renal/GU      Musculoskeletal   Abdominal   Peds  Hematology negative hematology ROS (+)   Anesthesia Other Findings Past Medical History: 06/03/2014: Adnexal mass     Comment:  since 2009 No date: Arthritis 01/2007: Diffuse large cell lymphoma in remission ([place])     Comment:  NON-HODGKINS-stage 3, cd 20 positive; status post 6               cycles of R-CHOP No date: Dyspnea     Comment:  with exertion No date: GERD (gastroesophageal reflux disease) 01/31/2009: Herpes zoster without complication No date: History of hiatal hernia No date: History of kidney stones No date: HOH (hard of hearing)     Comment:  wears hearing aides No date: HTN (hypertension)     Comment:  patient unaware of this diagnosis (white coat syndrome) No date: Non Hodgkin's lymphoma ([place]) No date: Recurrent UTI  Past Surgical History: 2009: ABDOMINAL SURGERY     Comment:  abdominal mass+ NH lymphoma, 01/2017: BACK SURGERY     Comment:  fusion. metal plate in neck at back 1997: CATARACT EXTRACTION     Comment:  right eye and left ey 1995: cervical neck fusion No date: DENTAL SURGERY     Comment:  screws 02/21/2017: KYPHOPLASTY; N/A     Comment:  Procedure: [id number];  Surgeon: Hessie Knows, MD;                Location: [facility] ORS;  Service: Orthopedics;  Laterality:               N/A; 03/02/2007: laparotomy with biopsy 2009: [place]: SPINE SURGERY No date: SQUAMOUS CELL CARCINOMA EXCISION     Comment:  right arm No date: TOTAL KNEE ARTHROPLASTY; Left     Comment:  2009 1971: VAGINAL HYSTERECTOMY  BMI    Body Mass Index:  26.60 kg/m      Reproductive/Obstetrics negative OB ROS                            Anesthesia Physical Anesthesia Plan  ASA: III  Anesthesia Plan: General ETT   Post-op Pain Management:    Induction:   PONV Risk Score and Plan: Ondansetron, Dexamethasone and Treatment may vary due to age or medical condition  Airway Management Planned:   Additional Equipment:   Intra-op Plan:   Post-operative Plan:   Informed Consent: I have reviewed the patients History and Physical, chart, labs and discussed the procedure including the risks, benefits and alternatives for the proposed anesthesia with the patient or authorized representative who has indicated his/her understanding and acceptance.   Dental Advisory Given  Plan Discussed with: Anesthesiologist, CRNA and Surgeon  Anesthesia Plan Comments:  Anesthesia Quick Evaluation  

## 2018-01-30 NOTE — Op Note (Signed)
Date of procedure: 01/30/18  Preoperative diagnosis:  1. Left hydronephrosis  Postoperative diagnosis:  1. Same  Procedure: 1. Cystoscopy, left retrograde pyelogram, left attempted ureteroscopy, left ureteral stent placement  Surgeon: Nickolas Madrid, MD  Anesthesia: General  Complications: None  Intraoperative findings:  1.  Normal cystoscopy, ureteral orifices orthotopic bilaterally 2.  Left retrograde pyelogram with appearance of UPJ obstruction with short narrowing proximally and dilated collecting system 3.  Unable to pass flexible ureteroscope past the mid ureter 4.  Uncomplicated left ureteral stent placement  EBL: None  Specimens: None  Drains: Left 6 French by 24 cm ureteral stent  Indication: [place] Monica Singh is a 85 y.o. patient with left-sided flank pain and recurrent UTIs that was found to have left moderate to severe hydronephrosis with possible enhancing mass with UPJ obstruction proximally.  After reviewing the management options for treatment, they elected to proceed with the above surgical procedure(s). We have discussed the potential benefits and risks of the procedure, side effects of the proposed treatment, the likelihood of the patient achieving the goals of the procedure, and any potential problems that might occur during the procedure or recuperation. Informed consent has been obtained.  Description of procedure:  The patient was taken to the operating room and general anesthesia was induced.  The patient was placed in the dorsal lithotomy position, prepped and draped in the usual sterile fashion, and preoperative antibiotics were administered. A preoperative time-out was performed.   A 21 French cystoscope was used to intubate the urethra.  Thorough cystoscopy was performed and was grossly normal.  The ureteral orifices were orthotopic bilaterally.  A 5 French access catheter was used to cannulate the left distal ureter and a retrograde pyelogram  demonstrated a short narrowing at the UPJ with upstream moderate to severe hydronephrosis.  A sensor wire was advanced through the access catheter into the collecting system.  A dual-lumen access catheter was used to dilate the distal and mid ureter and add a second safety wire.  We attempted to pass the flexible single-channel ureteroscope over the wire, but met resistance in the mid ureter.  Under direct vision there were no abnormalities or strictures, but the ureter was too narrow to accommodate the scope.  I then passed a Super Stiff wire through the scope and into the collecting system, but again met resistance when trying to advance the scope over the wire.  At this point, with her advanced age and frailty, I elected to place a left ureteral stent for passive dilation with plan to return in 2 to 3 weeks for definitive diagnostic ureteroscopy and possible biopsy.  The rigid cystoscope was backloaded over the wire, and a 6 [place] by 24 cm ureteral stent was uneventfully placed under direct vision.  There was an excellent curl in the renal pelvis on fluoroscopy as well as under direct vision in the bladder.  Contrast and urine drained briskly through the side-ports of the stent.  The bladder was drained and all scopes and wires were removed.  Disposition: Stable to PACU  Plan: Nitrofurantoin daily prophylaxis while stent in place Schedule repeat attempt at left diagnostic ureteroscopy in 2 to 3 weeks  Nickolas Madrid, MD

## 2018-02-04 ENCOUNTER — Other Ambulatory Visit: Payer: Self-pay | Admitting: Radiology

## 2018-02-04 ENCOUNTER — Telehealth: Payer: Self-pay | Admitting: Radiology

## 2018-02-04 DIAGNOSIS — N1339 Other hydronephrosis: Secondary | ICD-10-CM

## 2018-02-04 NOTE — Telephone Encounter (Signed)
Discussed the [facility] Information form below with daughter over the phone.   [place], [address] [place], [place] [address] Telephone: [phone] Fax: [phone]   Thank you for choosing [facility] for your upcoming surgery!  We are always here to assist in your urological needs.  Please read the following information with specific details for your upcoming appointments related to your surgery. Please contact Arlet Marter at [phone] Option 3 with any questions.  The Name of Your Surgery: Left diagnostic ureteroscopy, biopsy, ureteral stent exchange Your Surgery Date: 02/27/2018 Your Surgeon: Nickolas Madrid  Please call Same Day Surgery at [phone] between the hours of 1pm-3pm one day prior to your surgery. They will inform you of the time to arrive at Same Day Surgery which is located on the second floor of the [facility].  Patient will not have to repeat pre-admission testing appointment prior to this surgery.  Appointment made for repeat urinalysis & urine culture prior to surgery.  Patient was advised to have nothing to eat or drink after midnight the night prior to surgery except that she may have only water until 2 hours before surgery with nothing to drink within 2 hours of surgery.  The patient states she currently takes no blood thinners. Patient's questions were answered and she expressed understanding of these instructions.

## 2018-02-06 ENCOUNTER — Other Ambulatory Visit: Payer: Self-pay | Admitting: Radiology

## 2018-02-06 ENCOUNTER — Telehealth: Payer: Self-pay | Admitting: Radiology

## 2018-02-06 DIAGNOSIS — N1339 Other hydronephrosis: Secondary | ICD-10-CM

## 2018-02-06 MED ORDER — OXYBUTYNIN CHLORIDE 5 MG PO TABS: 5.0000 mg | ORAL_TABLET | Freq: Three times a day (TID) | ORAL | 0 refills | Status: DC | PRN

## 2018-02-06 MED ORDER — TAMSULOSIN HCL 0.4 MG PO CAPS: 0.4000 mg | ORAL_CAPSULE | Freq: Every day | ORAL | 2 refills | Status: DC

## 2018-02-06 NOTE — Telephone Encounter (Signed)
Medications sent to pharmacy

## 2018-02-06 NOTE — Progress Notes (Signed)
Monica Singh, Sulfa causes patient to have hives & itching. Since there is a cross-sensitivity between sulfa & tamsulosin I only sent a script for oxybutynin.  Notified daughter of script for oxybutynin sent to pharmacy. Questions answered. Daughter expresses understanding of conversation.

## 2018-02-06 NOTE — Telephone Encounter (Signed)
Monica Singh, I didn't order the tamsulosin because it has a cross-sensitivity with sulfa, which the patient is allergic to.  Notified daughter of script for oxybutynin sent to pharmacy. Questions answered. Daughter expresses understanding of conversation.

## 2018-02-06 NOTE — Telephone Encounter (Signed)
That would be fine.  Tamsulosin 0.4 mg daily, #30  and oxybutynin 5 mg prn, # 30

## 2018-02-06 NOTE — Telephone Encounter (Signed)
Daughter called stating patient reports left lower abdominal pain. Explained that this was likely due to pain related to left ureteral stent placement by  Dr Diamantina Providence on 01/30/2018. Advised alternating acetaminophen with ibuprofen.  Larene Beach, would oxybutynin or tamsulosin be appropriate?

## 2018-02-06 NOTE — Telephone Encounter (Signed)
error 

## 2018-02-09 ENCOUNTER — Ambulatory Visit: Payer: Medicare Other | Admitting: Family Medicine

## 2018-02-09 ENCOUNTER — Telehealth: Payer: Self-pay

## 2018-02-09 DIAGNOSIS — N1339 Other hydronephrosis: Secondary | ICD-10-CM | POA: Diagnosis not present

## 2018-02-09 LAB — URINALYSIS, COMPLETE
Bilirubin, UA: NEGATIVE
Glucose, UA: NEGATIVE
Ketones, UA: NEGATIVE
Nitrite, UA: NEGATIVE
Specific Gravity, UA: 1.01 (ref 1.005–1.030)
Urobilinogen, Ur: 0.2 mg/dL (ref 0.2–1.0)
pH, UA: 6 (ref 5.0–7.5)

## 2018-02-09 LAB — MICROSCOPIC EXAMINATION: WBC, UA: >30 /hpf — ABNORMAL HIGH (ref 0–5)

## 2018-02-09 MED ORDER — TAMSULOSIN HCL 0.4 MG PO CAPS: 0.4000 mg | ORAL_CAPSULE | Freq: Every day | ORAL | 1 refills | Status: DC

## 2018-02-09 NOTE — Progress Notes (Signed)
Patient presents today with dysuria and urinary frequency. Patient states she has recently had Urological surgery with a stent placement. She is currently on ABX. A urine was collected for UA, UCX. Dr Diamantina Providence reviewed the urine and he requests she take the current ABX 2 times daily and start Flomax and bedtime. Patient voiced understanding.

## 2018-02-09 NOTE — Telephone Encounter (Signed)
Daughter, [name], states patient reports relief of lower abdominal pain with oxybutynin. Also reports patient thinks she has a UTI. Appointment made for urine culture.

## 2018-02-09 NOTE — Progress Notes (Deleted)
appt scheduled

## 2018-02-10 ENCOUNTER — Emergency Department: Payer: Medicare Other

## 2018-02-10 ENCOUNTER — Inpatient Hospital Stay: Payer: Medicare Other

## 2018-02-10 ENCOUNTER — Other Ambulatory Visit: Payer: Self-pay

## 2018-02-10 ENCOUNTER — Telehealth: Payer: Self-pay | Admitting: Radiology

## 2018-02-10 ENCOUNTER — Inpatient Hospital Stay
Admission: EM | Admit: 2018-02-10 | Discharge: 2018-02-13 | DRG: 872 | Disposition: A | Discharge status: Home / Self Care | Payer: Medicare Other | Attending: Internal Medicine | Admitting: Internal Medicine

## 2018-02-10 DIAGNOSIS — E861 Hypovolemia: Secondary | ICD-10-CM | POA: Diagnosis present

## 2018-02-10 DIAGNOSIS — Z91011 Allergy to milk products: Secondary | ICD-10-CM

## 2018-02-10 DIAGNOSIS — Z7951 Long term (current) use of inhaled steroids: Secondary | ICD-10-CM

## 2018-02-10 DIAGNOSIS — Z87440 Personal history of urinary (tract) infections: Secondary | ICD-10-CM

## 2018-02-10 DIAGNOSIS — M810 Age-related osteoporosis without current pathological fracture: Secondary | ICD-10-CM | POA: Diagnosis present

## 2018-02-10 DIAGNOSIS — K219 Gastro-esophageal reflux disease without esophagitis: Secondary | ICD-10-CM | POA: Diagnosis present

## 2018-02-10 DIAGNOSIS — N12 Tubulo-interstitial nephritis, not specified as acute or chronic: Secondary | ICD-10-CM | POA: Diagnosis present

## 2018-02-10 DIAGNOSIS — R011 Cardiac murmur, unspecified: Secondary | ICD-10-CM | POA: Diagnosis not present

## 2018-02-10 DIAGNOSIS — Z87891 Personal history of nicotine dependence: Secondary | ICD-10-CM | POA: Diagnosis not present

## 2018-02-10 DIAGNOSIS — A4152 Sepsis due to Pseudomonas: Principal | ICD-10-CM | POA: Diagnosis present

## 2018-02-10 DIAGNOSIS — K5730 Diverticulosis of large intestine without perforation or abscess without bleeding: Secondary | ICD-10-CM | POA: Diagnosis not present

## 2018-02-10 DIAGNOSIS — I10 Essential (primary) hypertension: Secondary | ICD-10-CM | POA: Diagnosis not present

## 2018-02-10 DIAGNOSIS — E871 Hypo-osmolality and hyponatremia: Secondary | ICD-10-CM | POA: Diagnosis present

## 2018-02-10 DIAGNOSIS — Z974 Presence of external hearing-aid: Secondary | ICD-10-CM | POA: Diagnosis not present

## 2018-02-10 DIAGNOSIS — N3000 Acute cystitis without hematuria: Secondary | ICD-10-CM | POA: Diagnosis not present

## 2018-02-10 DIAGNOSIS — Z806 Family history of leukemia: Secondary | ICD-10-CM

## 2018-02-10 DIAGNOSIS — J449 Chronic obstructive pulmonary disease, unspecified: Secondary | ICD-10-CM | POA: Diagnosis present

## 2018-02-10 DIAGNOSIS — Z466 Encounter for fitting and adjustment of urinary device: Secondary | ICD-10-CM | POA: Diagnosis not present

## 2018-02-10 DIAGNOSIS — Z803 Family history of malignant neoplasm of breast: Secondary | ICD-10-CM | POA: Diagnosis not present

## 2018-02-10 DIAGNOSIS — B370 Candidal stomatitis: Secondary | ICD-10-CM | POA: Diagnosis present

## 2018-02-10 DIAGNOSIS — Z8572 Personal history of non-Hodgkin lymphomas: Secondary | ICD-10-CM | POA: Diagnosis not present

## 2018-02-10 DIAGNOSIS — Z881 Allergy status to other antibiotic agents status: Secondary | ICD-10-CM

## 2018-02-10 DIAGNOSIS — Z9841 Cataract extraction status, right eye: Secondary | ICD-10-CM

## 2018-02-10 DIAGNOSIS — A419 Sepsis, unspecified organism: Secondary | ICD-10-CM | POA: Diagnosis present

## 2018-02-10 DIAGNOSIS — Z9842 Cataract extraction status, left eye: Secondary | ICD-10-CM | POA: Diagnosis not present

## 2018-02-10 DIAGNOSIS — Z882 Allergy status to sulfonamides status: Secondary | ICD-10-CM | POA: Diagnosis not present

## 2018-02-10 DIAGNOSIS — Z87442 Personal history of urinary calculi: Secondary | ICD-10-CM | POA: Diagnosis not present

## 2018-02-10 DIAGNOSIS — M1990 Unspecified osteoarthritis, unspecified site: Secondary | ICD-10-CM | POA: Diagnosis not present

## 2018-02-10 DIAGNOSIS — H9190 Unspecified hearing loss, unspecified ear: Secondary | ICD-10-CM | POA: Diagnosis present

## 2018-02-10 DIAGNOSIS — Z96652 Presence of left artificial knee joint: Secondary | ICD-10-CM | POA: Diagnosis present

## 2018-02-10 DIAGNOSIS — Z79899 Other long term (current) drug therapy: Secondary | ICD-10-CM

## 2018-02-10 DIAGNOSIS — Z981 Arthrodesis status: Secondary | ICD-10-CM | POA: Diagnosis not present

## 2018-02-10 DIAGNOSIS — Z86010 Personal history of colonic polyps: Secondary | ICD-10-CM

## 2018-02-10 DIAGNOSIS — Z8249 Family history of ischemic heart disease and other diseases of the circulatory system: Secondary | ICD-10-CM

## 2018-02-10 DIAGNOSIS — Z90710 Acquired absence of both cervix and uterus: Secondary | ICD-10-CM

## 2018-02-10 DIAGNOSIS — Z85828 Personal history of other malignant neoplasm of skin: Secondary | ICD-10-CM

## 2018-02-10 DIAGNOSIS — R509 Fever, unspecified: Secondary | ICD-10-CM | POA: Diagnosis not present

## 2018-02-10 LAB — CHLAMYDIA/NGC RT PCR (ARMC ONLY)
Chlamydia Tr: NOT DETECTED
N gonorrhoeae: NOT DETECTED

## 2018-02-10 LAB — COMPREHENSIVE METABOLIC PANEL
ALT: 13 U/L (ref 0–44)
AST: 21 U/L (ref 15–41)
Albumin: 4.2 g/dL (ref 3.5–5.0)
Alkaline Phosphatase: 48 U/L (ref 38–126)
Anion gap: 12 (ref 5–15)
BUN: 15 mg/dL (ref 8–23)
CO2: 22 mmol/L (ref 22–32)
Calcium: 8.9 mg/dL (ref 8.9–10.3)
Chloride: 95 mmol/L — ABNORMAL LOW (ref 98–111)
Creatinine, Ser: 1.16 mg/dL — ABNORMAL HIGH (ref 0.44–1.00)
GFR calc Af Amer: 49 mL/min — ABNORMAL LOW (ref >60)
GFR calc non Af Amer: 42 mL/min — ABNORMAL LOW (ref >60)
Glucose, Bld: 221 mg/dL — ABNORMAL HIGH (ref 70–99)
Potassium: 3.9 mmol/L (ref 3.5–5.1)
Sodium: 129 mmol/L — ABNORMAL LOW (ref 135–145)
Total Bilirubin: 1 mg/dL (ref 0.3–1.2)
Total Protein: 7 g/dL (ref 6.5–8.1)

## 2018-02-10 LAB — URINALYSIS, COMPLETE (UACMP) WITH MICROSCOPIC
Bilirubin Urine: NEGATIVE
Glucose, UA: NEGATIVE mg/dL
Ketones, ur: 5 mg/dL — AB
Nitrite: NEGATIVE
Protein, ur: 100 mg/dL — AB
Specific Gravity, Urine: 1.013 (ref 1.005–1.030)
WBC, UA: >50 WBC/hpf — ABNORMAL HIGH (ref 0–5)
pH: 6 (ref 5.0–8.0)

## 2018-02-10 LAB — CBC WITH DIFFERENTIAL/PLATELET
Abs Immature Granulocytes: 0.07 10*3/uL (ref 0.00–0.07)
Basophils Absolute: 0.1 10*3/uL (ref 0.0–0.1)
Basophils Relative: 1 %
Eosinophils Absolute: 0 10*3/uL (ref 0.0–0.5)
Eosinophils Relative: 0 %
HCT: 37.1 % (ref 36.0–46.0)
Hemoglobin: 12.5 g/dL (ref 12.0–15.0)
Immature Granulocytes: 0 %
Lymphocytes Relative: 3 %
Lymphs Abs: 0.5 10*3/uL — ABNORMAL LOW (ref 0.7–4.0)
MCH: 31.9 pg (ref 26.0–34.0)
MCHC: 33.7 g/dL (ref 30.0–36.0)
MCV: 94.6 fL (ref 80.0–100.0)
Monocytes Absolute: 0.8 10*3/uL (ref 0.1–1.0)
Monocytes Relative: 5 %
Neutro Abs: 14.5 10*3/uL — ABNORMAL HIGH (ref 1.7–7.7)
Neutrophils Relative %: 91 %
Platelets: 346 10*3/uL (ref 150–400)
RBC: 3.92 MIL/uL (ref 3.87–5.11)
RDW: 12.3 % (ref 11.5–15.5)
WBC: 15.9 10*3/uL — ABNORMAL HIGH (ref 4.0–10.5)
nRBC: 0 % (ref 0.0–0.2)

## 2018-02-10 LAB — INFLUENZA PANEL BY PCR (TYPE A & B)
Influenza A By PCR: NEGATIVE
Influenza B By PCR: NEGATIVE

## 2018-02-10 LAB — CG4 I-STAT (LACTIC ACID)
Lactic Acid, Venous: 2.68 mmol/L (ref 0.5–1.9)
Lactic Acid, Venous: 1.83 mmol/L (ref 0.5–1.9)

## 2018-02-10 MED ORDER — ACETAMINOPHEN 325 MG PO TABS
650.0000 mg | ORAL_TABLET | Freq: Four times a day (QID) | ORAL | Status: DC | PRN
  Administered 2018-02-11: 16:00:00 650 mg via ORAL
  Filled 2018-02-10: qty 2

## 2018-02-10 MED ORDER — IPRATROPIUM-ALBUTEROL 0.5-2.5 (3) MG/3ML IN SOLN: 3.0000 mL | Freq: Four times a day (QID) | RESPIRATORY_TRACT | Status: DC | PRN

## 2018-02-10 MED ORDER — SODIUM CHLORIDE 0.9 % IV BOLUS
500.0000 mL | Freq: Once | INTRAVENOUS | Status: AC
  Administered 2018-02-10: 500 mL via INTRAVENOUS

## 2018-02-10 MED ORDER — OXYBUTYNIN CHLORIDE 5 MG PO TABS: 5.0000 mg | ORAL_TABLET | Freq: Three times a day (TID) | ORAL | Status: DC | PRN

## 2018-02-10 MED ORDER — POLYETHYLENE GLYCOL 3350 17 G PO PACK: 17.0000 g | PACK | Freq: Every day | ORAL | Status: DC | PRN

## 2018-02-10 MED ORDER — DOCUSATE SODIUM 100 MG PO CAPS
100.0000 mg | ORAL_CAPSULE | Freq: Two times a day (BID) | ORAL | Status: DC
  Administered 2018-02-11 – 2018-02-12 (×2): 100 mg via ORAL
  Filled 2018-02-10 (×6): qty 1

## 2018-02-10 MED ORDER — ENOXAPARIN SODIUM 40 MG/0.4ML ~~LOC~~ SOLN: 40.0000 mg | SUBCUTANEOUS | Status: DC

## 2018-02-10 MED ORDER — SODIUM CHLORIDE 0.9 % IV SOLN
INTRAVENOUS | Status: DC
  Administered 2018-02-11 – 2018-02-12 (×3): via INTRAVENOUS

## 2018-02-10 MED ORDER — IPRATROPIUM-ALBUTEROL 20-100 MCG/ACT IN AERS: 1.0000 | INHALATION_SPRAY | Freq: Every day | RESPIRATORY_TRACT | Status: DC

## 2018-02-10 MED ORDER — HEPARIN SODIUM (PORCINE) 5000 UNIT/ML IJ SOLN
5000.0000 [IU] | Freq: Three times a day (TID) | INTRAMUSCULAR | Status: DC
  Administered 2018-02-11 – 2018-02-13 (×7): 5000 [IU] via SUBCUTANEOUS
  Filled 2018-02-10 (×7): qty 1

## 2018-02-10 MED ORDER — ONDANSETRON HCL 4 MG/2ML IJ SOLN: 4.0000 mg | Freq: Four times a day (QID) | INTRAMUSCULAR | Status: DC | PRN

## 2018-02-10 MED ORDER — ACETAMINOPHEN 500 MG PO TABS
1000.0000 mg | ORAL_TABLET | Freq: Once | ORAL | Status: AC
  Administered 2018-02-10: 1000 mg via ORAL
  Filled 2018-02-10: qty 2

## 2018-02-10 MED ORDER — IPRATROPIUM-ALBUTEROL 0.5-2.5 (3) MG/3ML IN SOLN: 3.0000 mL | Freq: Four times a day (QID) | RESPIRATORY_TRACT | Status: DC

## 2018-02-10 MED ORDER — TAMSULOSIN HCL 0.4 MG PO CAPS
0.4000 mg | ORAL_CAPSULE | Freq: Every day | ORAL | Status: DC
  Administered 2018-02-11 – 2018-02-12 (×2): 0.4 mg via ORAL
  Filled 2018-02-10 (×2): qty 1

## 2018-02-10 MED ORDER — ACETAMINOPHEN 650 MG RE SUPP: 650.0000 mg | Freq: Four times a day (QID) | RECTAL | Status: DC | PRN

## 2018-02-10 MED ORDER — PANTOPRAZOLE SODIUM 40 MG PO TBEC
40.0000 mg | DELAYED_RELEASE_TABLET | Freq: Every day | ORAL | Status: DC
  Administered 2018-02-11 – 2018-02-13 (×3): 40 mg via ORAL
  Filled 2018-02-10 (×3): qty 1

## 2018-02-10 MED ORDER — SODIUM CHLORIDE 0.9 % IV SOLN
2.0000 g | Freq: Once | INTRAVENOUS | Status: AC
  Administered 2018-02-10: 2 g via INTRAVENOUS
  Filled 2018-02-10: qty 2

## 2018-02-10 MED ORDER — ONDANSETRON HCL 4 MG PO TABS: 4.0000 mg | ORAL_TABLET | Freq: Four times a day (QID) | ORAL | Status: DC | PRN

## 2018-02-10 MED ORDER — IOHEXOL 300 MG/ML  SOLN
75.0000 mL | Freq: Once | INTRAMUSCULAR | Status: AC | PRN
  Administered 2018-02-10: 75 mL via INTRAVENOUS

## 2018-02-10 NOTE — ED Notes (Signed)
Lactic 2.68, MD aware

## 2018-02-10 NOTE — ED Triage Notes (Signed)
PT form home with daughter, c/o emesis and fever. Pt had confirmed UTI yesterday in PCP with recent kidney stents placed on 1/3. A&Ox3 at baseline

## 2018-02-10 NOTE — ED Notes (Signed)
Report called and given to [facility]. Room currently being cleaned by Trudy. Room to be ready at 8:30PM.

## 2018-02-10 NOTE — ED Provider Notes (Signed)
[facility] Emergency Department Provider Note    First MD Initiated Contact with Patient 02/10/18 1538     (approximate)  I have reviewed the triage vital signs and the nursing notes.   HISTORY  Chief Complaint No chief complaint on file.    HPI Monica Singh is a 85 y.o. female presents the ER with fever chills and Reiger's as well as left flank pain.  Patient had ureteral stent placement on the third.  She is been taking Macrobid.  Is having persistent pain since the procedure but no worsening of pain.  Today had nausea vomiting is had decreased oral intake.  Was evaluated for UTI yesterday but was not started on any antibiotics.  Due to her high fevers and measured temperature of 102.6 at home was directed to the ER for further evaluation.    Past Medical History:  Diagnosis Date  . Adnexal mass 06/03/2014   since 2009  . Arthritis   . Diffuse large cell lymphoma in remission ([place]) 01/2007   NON-HODGKINS-stage 3, cd 20 positive; status post 6 cycles of R-CHOP  . Dyspnea    with exertion  . GERD (gastroesophageal reflux disease)   . Herpes zoster without complication 07/06/6293  . History of hiatal hernia   . History of kidney stones   . HOH (hard of hearing)    wears hearing aides  . HTN (hypertension)    patient unaware of this diagnosis (white coat syndrome)  . Non Hodgkin's lymphoma ([place])   . Recurrent UTI    Family History  Problem Relation Age of Onset  . Breast cancer Sister   . Dementia Sister   . Cataracts Sister   . Heart attack Brother   . CAD Brother   . Heart attack Brother   . Leukemia Grandchild        granddaughter  . Kidney disease Neg Hx   . Bladder Cancer Neg Hx    Past Surgical History:  Procedure Laterality Date  . ABDOMINAL SURGERY  2009   abdominal mass+ NH lymphoma,  . BACK SURGERY  01/2017   fusion. metal plate in neck at back  . CATARACT EXTRACTION  1997   right eye and left ey  . cervical neck  fusion  1995  . CYSTOSCOPY W/ RETROGRADES Left 01/30/2018   Procedure: CYSTOSCOPY WITH RETROGRADE PYELOGRAM;  Surgeon: Billey Co, MD;  Location: [facility] ORS;  Service: Urology;  Laterality: Left;  . CYSTOSCOPY WITH STENT PLACEMENT Left 01/30/2018   Procedure: CYSTOSCOPY WITH STENT PLACEMENT;  Surgeon: Billey Co, MD;  Location: [facility] ORS;  Service: Urology;  Laterality: Left;  . DENTAL SURGERY     screws  . KYPHOPLASTY N/A 02/21/2017   Procedure: [id number];  Surgeon: Hessie Knows, MD;  Location: [facility] ORS;  Service: Orthopedics;  Laterality: N/A;  . laparotomy with biopsy  03/02/2007  . PORTACATH PLACEMENT  2009  . [place]  . SQUAMOUS CELL CARCINOMA EXCISION     right arm  . TOTAL KNEE ARTHROPLASTY Left    2009  . URETEROSCOPY Left 01/30/2018   Procedure: URETEROSCOPY;  Surgeon: Billey Co, MD;  Location: [facility] ORS;  Service: Urology;  Laterality: Left;  Marland Kitchen VAGINAL HYSTERECTOMY  1971   Patient Active Problem List   Diagnosis Date Noted  . Closed burst fracture of lumbar vertebra with routine healing 04/01/2017  . Chronic bronchitis ([place]) 02/18/2017  . Osteoporosis 11/20/2015  . COPD (chronic obstructive pulmonary disease) ([place]) 11/20/2015  .  Cyst of ovary 07/26/2015  . Status post total left knee replacement 04/03/2015  . Abnormal chest sounds 08/25/2014  . Allergic rhinitis 08/25/2014  . Colon polyp 08/25/2014  . DDD (degenerative disc disease), cervical 08/25/2014  . DDD (degenerative disc disease), lumbar 08/25/2014  . H/O non-Hodgkin's lymphoma 08/25/2014  . Below normal amount of sodium in the blood 08/25/2014  . Arthritis of knee, degenerative 08/25/2014  . Subclinical hypothyroidism 08/25/2014  . Adnexal mass 06/03/2014  . 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 History of smoking 03/11/2008      Prior to Admission medications   Medication Sig Start Date End Date Taking? Authorizing Provider  Apoaequorin (PREVAGEN PO) Take 1 tablet by  mouth daily.   Yes [provider]  COMBIVENT RESPIMAT 20-100 MCG/ACT AERS respimat INHALE 2 PUFFS INTO THE LUNGS 3 TIMES DAILY Patient taking differently: Inhale 1 puff into the lungs daily.  09/30/17  Yes Birdie Sons, MD  nitrofurantoin (MACRODANTIN) 100 MG capsule Take 1 capsule (100 mg total) by mouth daily for 21 days. 01/30/18 02/20/18 Yes Billey Co, MD  omeprazole (PRILOSEC) 20 MG capsule Take 20 mg by mouth daily.    Yes [provider]  oxybutynin (DITROPAN) 5 MG tablet Take 1 tablet (5 mg total) by mouth every 8 (eight) hours as needed for bladder spasms (For stent pain). 02/06/18  Yes McGowan, Larene Beach A, [name]  risedronate (ACTONEL) 35 MG tablet TAKE 1 TABLET BY MOUTH EVERY SUNDAY AS DIRECTED Patient taking differently: Take 35 mg by mouth every Sunday.  05/07/17  Yes Birdie Sons, MD  tamsulosin (FLOMAX) 0.4 MG CAPS capsule Take 1 capsule (0.4 mg total) by mouth daily after supper. 02/09/18  Yes Billey Co, MD    Allergies Milk-related compounds; Lac bovis; and Sulfa antibiotics    Social History Social History   Tobacco Use  . Smoking status: Former Smoker    Packs/day: 0.25    Types: Cigarettes    Last attempt to quit: 06/21/1989    Years since quitting: 28.6  . Smokeless tobacco: Former Systems developer    Quit date: 01/28/1989  Substance Use Topics  . Alcohol use: Yes    Alcohol/week: 14.0 standard drinks    Types: 14 Glasses of wine per week  . Drug use: No    Review of Systems Patient denies headaches, rhinorrhea, blurry vision, numbness, shortness of breath, chest pain, edema, cough, abdominal pain, nausea, vomiting, diarrhea, dysuria, fevers, rashes or hallucinations unless otherwise stated above in HPI. ____________________________________________   PHYSICAL EXAM:  VITAL SIGNS: Vitals:   02/10/18 1411  BP: (!) 128/50  Pulse: (!) 106  Resp: 16  Temp: 100.2 F (37.9 C)  SpO2: 95%    Constitutional: Alert and oriented.  Eyes:  Conjunctivae are normal.  Head: Atraumatic. Nose: No congestion/rhinnorhea. Mouth/Throat: Mucous membranes are moist.   Neck: No stridor. Painless ROM.  Cardiovascular: mildly tachycardic rate, regular rhythm. Grossly normal heart sounds.  Good peripheral circulation. Respiratory: Normal respiratory effort.  No retractions. Lungs CTAB. Gastrointestinal: Soft and nontender. No distention. No abdominal bruits. No CVA tenderness. Genitourinary: deferred Musculoskeletal: No lower extremity tenderness nor edema.  No joint effusions. Neurologic:  Normal speech and language. No gross focal neurologic deficits are appreciated. No facial droop Skin:  Skin is warm, dry and intact. No rash noted. Psychiatric: Mood and affect are normal. Speech and behavior are normal.  ____________________________________________   LABS (all labs ordered are listed, but only abnormal results are displayed)  Results  for orders placed or performed during the hospital encounter of 02/10/18 (from the past 24 hour(s))  Comprehensive metabolic panel     Status: Abnormal   Collection Time: 02/10/18  2:15 PM  Result Value Ref Range   Sodium 129 (L) 135 - 145 mmol/L   Potassium 3.9 3.5 - 5.1 mmol/L   Chloride 95 (L) 98 - 111 mmol/L   CO2 22 22 - 32 mmol/L   Glucose, Bld 221 (H) 70 - 99 mg/dL   BUN 15 8 - 23 mg/dL   Creatinine, Ser 1.16 (H) 0.44 - 1.00 mg/dL   Calcium 8.9 8.9 - 10.3 mg/dL   Total Protein 7.0 6.5 - 8.1 g/dL   Albumin 4.2 3.5 - 5.0 g/dL   AST 21 15 - 41 U/L   ALT 13 0 - 44 U/L   Alkaline Phosphatase 48 38 - 126 U/L   Total Bilirubin 1.0 0.3 - 1.2 mg/dL   GFR calc non Af Amer 42 (L) >60 mL/min   GFR calc Af Amer 49 (L) >60 mL/min   Anion gap 12 5 - 15  CBC with Differential     Status: Abnormal   Collection Time: 02/10/18  2:15 PM  Result Value Ref Range   WBC 15.9 (H) 4.0 - 10.5 K/uL   RBC 3.92 3.87 - 5.11 MIL/uL   Hemoglobin 12.5 12.0 - 15.0 g/dL   HCT 37.1 36.0 - 46.0 %   MCV 94.6 80.0 -  100.0 fL   MCH 31.9 26.0 - 34.0 pg   MCHC 33.7 30.0 - 36.0 g/dL   RDW 12.3 11.5 - 15.5 %   Platelets 346 150 - 400 K/uL   nRBC 0.0 0.0 - 0.2 %   Neutrophils Relative % 91 %   Neutro Abs 14.5 (H) 1.7 - 7.7 K/uL   Lymphocytes Relative 3 %   Lymphs Abs 0.5 (L) 0.7 - 4.0 K/uL   Monocytes Relative 5 %   Monocytes Absolute 0.8 0.1 - 1.0 K/uL   Eosinophils Relative 0 %   Eosinophils Absolute 0.0 0.0 - 0.5 K/uL   Basophils Relative 1 %   Basophils Absolute 0.1 0.0 - 0.1 K/uL   Immature Granulocytes 0 %   Abs Immature Granulocytes 0.07 0.00 - 0.07 K/uL  CG4 I-STAT (Lactic acid)     Status: Abnormal   Collection Time: 02/10/18  2:31 PM  Result Value Ref Range   Lactic Acid, Venous 2.68 (HH) 0.5 - 1.9 mmol/L   Comment NOTIFIED PHYSICIAN   Urinalysis, Complete w Microscopic     Status: Abnormal   Collection Time: 02/10/18  4:11 PM  Result Value Ref Range   Color, Urine YELLOW (A) YELLOW   APPearance CLOUDY (A) CLEAR   Specific Gravity, Urine 1.013 1.005 - 1.030   pH 6.0 5.0 - 8.0   Glucose, UA NEGATIVE NEGATIVE mg/dL   Hgb urine dipstick SMALL (A) NEGATIVE   Bilirubin Urine NEGATIVE NEGATIVE   Ketones, ur 5 (A) NEGATIVE mg/dL   Protein, ur 100 (A) NEGATIVE mg/dL   Nitrite NEGATIVE NEGATIVE   Leukocytes, UA LARGE (A) NEGATIVE   RBC / HPF 21-50 0 - 5 RBC/hpf   WBC, UA >50 (H) 0 - 5 WBC/hpf   Bacteria, UA RARE (A) NONE SEEN   Squamous Epithelial / LPF 6-10 0 - 5   WBC Clumps PRESENT    Mucus PRESENT    Budding Yeast PRESENT    Sperm, UA PRESENT    ____________________________________________ ____________________________________________  RADIOLOGY  I  personally reviewed all radiographic images ordered to evaluate for the above acute complaints and reviewed radiology reports and findings.  These findings were personally discussed with the patient.  Please see medical record for radiology report.  ____________________________________________   PROCEDURES  Procedure(s)  performed:  Procedures    Critical Care performed: no ____________________________________________   INITIAL IMPRESSION / ASSESSMENT AND PLAN / ED COURSE  Pertinent labs & imaging results that were available during my care of the patient were reviewed by me and considered in my medical decision making (see chart for details).   DDX: sepsis, uti, abscess, enteritis, gastritis, pna, influenza  Adin is a 85 y.o. who presents to the ED with symptoms as described above.  Certainly concerning for urosepsis.  We will give IV fluids.  And will check blood work.  Consulted with Dr. Bernardo Heater of urology given her recent urologic procedure is recommended KUB to ensure stent placement but otherwise should be well decompressed with good ureteral flow no indication for additional imaging.  Clinical Course as of Feb 11 1823  Tue Feb 10, 2018  1825 Repeat lactate improving.  Based on patient's presentation she will require hospitalization for IV antibiotics and further medical management   [PR]    Clinical Course User Index [PR] Merlyn Lot, MD     As part of my medical decision making, I reviewed the following data within the [facility] notes reviewed and incorporated, Labs reviewed, notes from prior ED visits.  ____________________________________________   FINAL CLINICAL IMPRESSION(S) / ED DIAGNOSES  Final diagnoses:  Sepsis, due to unspecified organism, unspecified whether acute organ dysfunction present [facility])  Pyelonephritis      Monica MEDICATIONS STARTED DURING THIS VISIT:  Monica Prescriptions   No medications on file     Note:  This document was prepared using Dragon voice recognition software and may include unintentional dictation errors.    Merlyn Lot, MD 02/10/18 [phone]

## 2018-02-10 NOTE — Telephone Encounter (Signed)
Called to give instructions to daughter per Dr Diamantina Providence. Daughter states patient has spiked a temperature of 102.6. Advised for patient to be taken to the emergency room. Questions answered. Daughter expresses understanding.

## 2018-02-10 NOTE — Telephone Encounter (Signed)
-----   Message from Billey Co, MD sent at 02/10/2018  8:23 AM EST ----- Regarding: allergy Yes, the cross sensitivity with flomax and sulfa is <10%, so I am ok with patients with sulfa allergy using flomax.  Nickolas Madrid, MD 02/10/2018  ----- Message ----- From: Ranell Patrick, RN Sent: 02/09/2018   5:02 PM EST To: Billey Co, MD  Larene Beach previously ordered tamsulosin but since there is a cross-sensitivity with Sulfa, which she is allergic to, we told her to only take oxybutynin. Should she take the tamsulosin anyway?

## 2018-02-10 NOTE — H&P (Signed)
[facility] [place] NAME: [place]    MR#:  [id number]  DATE OF BIRTH:  1929-01-16  DATE OF ADMISSION:  02/10/2018  PRIMARY CARE PHYSICIAN: Birdie Sons, MD   REQUESTING/REFERRING PHYSICIAN: Dr. Merlyn Lot  CHIEF COMPLAINT:  No chief complaint on file.   HISTORY OF PRESENT ILLNESS:  [place] Grindle  is a very pleasant 85 y.o. female with a known history of arthritis, large cell lymphoma in remission, GERD, history of kidney stones and UTIs, hypertension who had left-sided hydronephrosis for which she had a left ureteral stent placement on 01/30/2018 presents to hospital secondary to fevers, chills and nausea.  Patient had a left ureteral stent placed in for left-sided hydronephrosis on 01/30/2018, she had some flank pain after the procedure and was prescribed Flomax and oxybutynin.  Denies any fevers and chills and was doing well up until yesterday.  She started having malaise, chills and nausea and vomiting.  She had dysuria and increased frequency of urination with worsening bilateral flank pain.  She went for follow-up with urologist and had urine analysis done yesterday that showed infection.  She was prescribed Macrobid, she started taking the medicine, however her fever spiked up to 102, she felt extremely lethargic, confused with nausea and vomiting and unable to take anything by mouth and so presented to the emergency room.  She is noted to be septic with elevated white count, tachycardia, fever and elevated lactic acid secondary to urinary tract infection and being admitted for the same.  PAST MEDICAL HISTORY:   Past Medical History:  Diagnosis Date  . Adnexal mass 06/03/2014   since 2009  . Arthritis   . Diffuse large cell lymphoma in remission ([place]) 01/2007   NON-HODGKINS-stage 3, cd 20 positive; status post 6 cycles of R-CHOP  . Dyspnea    with exertion  . GERD (gastroesophageal reflux disease)   . Herpes zoster  without complication 06/04/3218  . History of hiatal hernia   . History of kidney stones   . HOH (hard of hearing)    wears hearing aides  . HTN (hypertension)    patient unaware of this diagnosis (white coat syndrome)  . Non Hodgkin's lymphoma ([place])   . Recurrent UTI     PAST SURGICAL HISTORY:   Past Surgical History:  Procedure Laterality Date  . ABDOMINAL SURGERY  2009   abdominal mass+ [place] lymphoma,  . BACK SURGERY  01/2017   fusion. metal plate in neck at back  . CATARACT EXTRACTION  1997   right eye and left ey  . cervical neck fusion  1995  . CYSTOSCOPY W/ RETROGRADES Left 01/30/2018   Procedure: CYSTOSCOPY WITH RETROGRADE PYELOGRAM;  Surgeon: Billey Co, MD;  Location: [facility] ORS;  Service: Urology;  Laterality: Left;  . CYSTOSCOPY WITH STENT PLACEMENT Left 01/30/2018   Procedure: CYSTOSCOPY WITH STENT PLACEMENT;  Surgeon: Billey Co, MD;  Location: [facility];  Service: Urology;  Laterality: Left;  . DENTAL SURGERY     screws  . KYPHOPLASTY N/A 02/21/2017   Procedure: [id number];  Surgeon: Hessie Knows, MD;  Location: [facility] OR[facility];  Service: Orthopedics;  Laterality: N/A;  . laparotomy with biopsy  03/02/2007  . PORTACATH PLACEMENT  2009  . [address]  . SQUAMOUS CELL CARCINOMA EXCISION     right arm  . TOTAL KNEE ARTHROPLASTY Left    2009  . URETEROSCOPY Left 01/30/2018   Procedure: URETEROSCOPY;  Surgeon: Nickolas Madrid  C, MD;  Location: [facility];  Service: Urology;  Laterality: Left;  Marland Kitchen VAGINAL HYSTERECTOMY  1971    SOCIAL HISTORY:   Social History   Tobacco Use  . Smoking status: Former Smoker    Packs/day: 0.25    Types: Cigarettes    Last attempt to quit: 06/21/1989    Years since quitting: 28.6  . Smokeless tobacco: Former Systems developer    Quit date: 01/28/1989  Substance Use Topics  . Alcohol use: Yes    Alcohol/week: 14.0 standard drinks    Types: 14 Glasses of wine per week    FAMILY HISTORY:   Family History  Problem Relation Age of Onset    . Breast cancer Sister   . Dementia Sister   . Cataracts Sister   . Heart attack Brother   . CAD Brother   . Heart attack Brother   . Leukemia Grandchild        granddaughter  . Kidney disease Neg Hx   . Bladder Cancer Neg Hx     DRUG ALLERGIES:   Allergies  Allergen Reactions  . Milk-Related Compounds Diarrhea and Nausea And Vomiting    Gas too  . Lac Bovis Diarrhea and Nausea And Vomiting    Milk allergy  . Sulfa Antibiotics Hives and Itching    REVIEW OF SYSTEMS:   Review of Systems  Constitutional: Positive for chills, fever and malaise/fatigue. Negative for weight loss.  HENT: Negative for ear discharge, ear pain, hearing loss and nosebleeds.   Eyes: Negative for blurred vision, double vision and photophobia.  Respiratory: Negative for cough, hemoptysis, shortness of breath and wheezing.   Cardiovascular: Negative for chest pain, palpitations, orthopnea and leg swelling.  Gastrointestinal: Positive for nausea and vomiting. Negative for abdominal pain, constipation, diarrhea, heartburn and melena.  Genitourinary: Positive for dysuria, flank pain and frequency. Negative for hematuria and urgency.  Musculoskeletal: Positive for myalgias. Negative for back pain and neck pain.  Skin: Negative for rash.  Neurological: Negative for dizziness, tingling, tremors, sensory change, speech change, focal weakness and headaches.  Endo/Heme/Allergies: Does not bruise/bleed easily.  Psychiatric/Behavioral: Negative for depression.    MEDICATIONS AT HOME:   Prior to Admission medications   Medication Sig Start Date End Date Taking? Authorizing Provider  Apoaequorin (PREVAGEN PO) Take 1 tablet by mouth daily.   Yes [provider]  COMBIVENT RESPIMAT 20-100 MCG/ACT AERS respimat INHALE 2 PUFFS INTO THE LUNGS 3 TIMES DAILY Patient taking differently: Inhale 1 puff into the lungs daily.  09/30/17  Yes Birdie Sons, MD  nitrofurantoin (MACRODANTIN) 100 MG capsule Take 1  capsule (100 mg total) by mouth daily for 21 days. 01/30/18 02/20/18 Yes Billey Co, MD  omeprazole (PRILOSEC) 20 MG capsule Take 20 mg by mouth daily.    Yes [provider]  oxybutynin (DITROPAN) 5 MG tablet Take 1 tablet (5 mg total) by mouth every 8 (eight) hours as needed for bladder spasms (For stent pain). 02/06/18  Yes McGowan, Larene Beach A, [name]  risedronate (ACTONEL) 35 MG tablet TAKE 1 TABLET BY MOUTH EVERY SUNDAY AS DIRECTED Patient taking differently: Take 35 mg by mouth every Sunday.  05/07/17  Yes Birdie Sons, MD  tamsulosin (FLOMAX) 0.4 MG CAPS capsule Take 1 capsule (0.4 mg total) by mouth daily after supper. 02/09/18  Yes Billey Co, MD      VITAL SIGNS:  Blood pressure (!) 151/75, pulse (!) 108, temperature 100.2 F (37.9 C), temperature source Oral, resp. rate 18, weight  65.8 kg, SpO2 94 %.  PHYSICAL EXAMINATION:   Physical Exam  GENERAL:  85 y.o.-year-old elderly patient lying in the bed, appears sick. EYES: Pupils equal, round, reactive to light and accommodation. No scleral icterus. Extraocular muscles intact.  HEENT: Head atraumatic, normocephalic. Oropharynx and nasopharynx clear.  NECK:  Supple, no jugular venous distention. No thyroid enlargement, no tenderness.  LUNGS: Normal breath sounds bilaterally, no wheezing, rales,rhonchi or crepitation. No use of accessory muscles of respiration. Decreased bibasilar breath sounds CARDIOVASCULAR: S1, S2 normal. No  rubs, or gallops. 2/6 systolic murmur present ABDOMEN: Soft, nontender, nondistended. Bowel sounds present. No organomegaly or mass.  EXTREMITIES: No pedal edema, cyanosis, or clubbing.  NEUROLOGIC: Cranial nerves II through XII are intact. Muscle strength 5/5 in all extremities. Sensation intact. Gait not checked.  PSYCHIATRIC: The patient is alert and oriented x 3.  SKIN: No obvious rash, lesion, or ulcer.   LABORATORY PANEL:   CBC Recent Labs  Lab 02/10/18 1415  WBC 15.9*  HGB 12.5    HCT 37.1  PLT 346   ------------------------------------------------------------------------------------------------------------------  Chemistries  Recent Labs  Lab 02/10/18 1415  NA 129*  K 3.9  CL 95*  CO2 22  GLUCOSE 221*  BUN 15  CREATININE 1.16*  CALCIUM 8.9  AST 21  ALT 13  ALKPHOS 48  BILITOT 1.0   ------------------------------------------------------------------------------------------------------------------  Cardiac Enzymes No results for input(s): TROPONINI in the last 168 hours. ------------------------------------------------------------------------------------------------------------------  RADIOLOGY:  Dg Chest 2 View  Result Date: 02/10/2018 CLINICAL DATA:  Vomiting and fever. Patient diagnosed with urinary tract infection yesterday. EXAM: CHEST - 2 VIEW COMPARISON:  PA and lateral chest 07/05/2015. FINDINGS: Small focus of linear scar in the lingula is unchanged. Lungs otherwise clear. Heart size is normal. Aortic atherosclerosis is noted. No pneumothorax or pleural effusion. Thoracolumbar scoliosis is seen. The patient is status post vertebral augmentation at the thoracolumbar junction. IMPRESSION: No acute disease. Atherosclerosis. Electronically Signed   By: Inge Rise M.D.   On: 02/10/2018 17:13    EKG:   Orders placed or performed during the hospital encounter of 01/30/18  . EKG 12-Lead  . EKG 12-Lead  . EKG 12-Lead  . EKG 12-Lead    IMPRESSION AND PLAN:   [place]on  is a very pleasant 85 y.o. female with a known history of arthritis, large cell lymphoma in remission, GERD, history of kidney stones and UTIs, hypertension who had left-sided hydronephrosis for which she had a left ureteral stent placement on 01/30/2018 presents to hospital secondary to fevers, chills and nausea.  1.  Sepsis-likely pyelonephritis and acute cystitis -Follow blood and urine cultures -Started on Rocephin.  Received cefepime in the ED -Follow-up CT to  rule out further hydronephrosis -Urology consulted due to history of recent stent placement  2.  Recent left ureteral stent placed for hydronephrosis-continue Flomax and PRN oxybutynin -Urology consult  3.  Hyponatremia-secondary to poor oral intake, hypovolemic hyponatremia -Follow-up with IV fluids  4.  GERD-Protonix  5.  DVT prophylaxis-Lovenox  Patient is independent at baseline.  Please encourage ambulation as tolerated    All the records are reviewed and case discussed with ED provider. Management plans discussed with the patient, family and they are in agreement.  CODE STATUS: Full Code  TOTAL TIME TAKING CARE OF THIS PATIENT: 52 minutes.    Gladstone Lighter M.D on 02/10/2018 at 7:24 PM  Between 7am to 6pm - Pager - [phone]  After 6pm go to [email or web address] - [facility]  Office  [phone]  CC: Primary care physician; Birdie Sons, MD

## 2018-02-10 NOTE — Telephone Encounter (Signed)
Discussed Dr Doristine Counter note below regarding using flomax. Daughter reports that patient has had fevers up to 100.8 with chills & vomiting since last night. Vomiting happened after taking oxybutynin & tylenol at the same time on an empty stomach. Recommended that patient try to eat something before taking medications.

## 2018-02-11 LAB — CBC
HCT: 33.7 % — ABNORMAL LOW (ref 36.0–46.0)
Hemoglobin: 11.3 g/dL — ABNORMAL LOW (ref 12.0–15.0)
MCH: 31.6 pg (ref 26.0–34.0)
MCHC: 33.5 g/dL (ref 30.0–36.0)
MCV: 94.1 fL (ref 80.0–100.0)
Platelets: 306 10*3/uL (ref 150–400)
RBC: 3.58 MIL/uL — ABNORMAL LOW (ref 3.87–5.11)
RDW: 12.3 % (ref 11.5–15.5)
WBC: 18 10*3/uL — ABNORMAL HIGH (ref 4.0–10.5)
nRBC: 0 % (ref 0.0–0.2)

## 2018-02-11 LAB — BASIC METABOLIC PANEL
Anion gap: 10 (ref 5–15)
BUN: 15 mg/dL (ref 8–23)
CO2: 22 mmol/L (ref 22–32)
Calcium: 8.4 mg/dL — ABNORMAL LOW (ref 8.9–10.3)
Chloride: 97 mmol/L — ABNORMAL LOW (ref 98–111)
Creatinine, Ser: 1.01 mg/dL — ABNORMAL HIGH (ref 0.44–1.00)
GFR calc Af Amer: 58 mL/min — ABNORMAL LOW (ref >60)
GFR calc non Af Amer: 50 mL/min — ABNORMAL LOW (ref >60)
Glucose, Bld: 137 mg/dL — ABNORMAL HIGH (ref 70–99)
Potassium: 3.6 mmol/L (ref 3.5–5.1)
Sodium: 129 mmol/L — ABNORMAL LOW (ref 135–145)

## 2018-02-11 MED ORDER — CIPROFLOXACIN IN D5W 400 MG/200ML IV SOLN
400.0000 mg | Freq: Two times a day (BID) | INTRAVENOUS | Status: DC
  Administered 2018-02-11: 400 mg via INTRAVENOUS
  Filled 2018-02-11 (×2): qty 200

## 2018-02-11 MED ORDER — SODIUM CHLORIDE 0.9 % IV SOLN
2.0000 g | Freq: Two times a day (BID) | INTRAVENOUS | Status: DC
  Administered 2018-02-11: 2 g via INTRAVENOUS
  Filled 2018-02-11 (×3): qty 2

## 2018-02-11 MED ORDER — SODIUM CHLORIDE 0.9 % IV SOLN
2.0000 g | INTRAVENOUS | Status: DC
  Administered 2018-02-11: 04:00:00 2 g via INTRAVENOUS
  Filled 2018-02-11: qty 2
  Filled 2018-02-11: qty 20

## 2018-02-11 MED ORDER — DIPHENHYDRAMINE HCL 25 MG PO CAPS
25.0000 mg | ORAL_CAPSULE | Freq: Four times a day (QID) | ORAL | Status: DC | PRN
  Administered 2018-02-11: 25 mg via ORAL
  Filled 2018-02-11: qty 1

## 2018-02-11 MED ORDER — SODIUM CHLORIDE 0.9 % IV SOLN
2.0000 g | Freq: Two times a day (BID) | INTRAVENOUS | Status: DC
  Administered 2018-02-12 – 2018-02-13 (×3): 2 g via INTRAVENOUS
  Filled 2018-02-11 (×4): qty 2

## 2018-02-11 NOTE — Consult Note (Signed)
Chart reviewed.  Monica Singh is an 85 year old female with history of left flank pain recurrent UTIs.  She has moderate to severe left hydronephrosis and questionable enhancing mass with UPJ obstruction.  She underwent cystoscopy with left ureteral stent placement by Dr. Diamantina Providence on 01/30/2018.  Left ureteroscopy could only be performed to the mid ureter.  She has been on daily nitrofurantoin prophylaxis.  On 1/13 she developed malaise, chills, nausea and vomiting.  She has had fever to 102 degrees.  She was admitted sepsis from a urinary source.  The ED had contacted me last night regarding their plans for admission to the hospitalist for IV antibiotics.  I did recommend a KUB which was reviewed and shows the stent in good position.  Impression: UTI/pyelonephritis.  Her stent is in good position.  Recommendation: No urgent need for urologic intervention.  Continue IV antibiotics pending urine culture/sensitivities.  She will be scheduled for follow-up ureteroscopy by Dr. Diamantina Providence in the near future.

## 2018-02-11 NOTE — Progress Notes (Signed)
Advanced Care Plan.  Purpose of Encounter: CODE STATUS. Parties in Attendance: The patient, her daughter and me. Patient's Decisional Capacity: Yes. Medical Story: Monica Singh  is a very pleasant 85 y.o. female with a known history of arthritis, large cell lymphoma in remission, GERD, history of kidney stones and UTIs, hypertension who had left-sided hydronephrosis.  The patient is admitted for sepsis due to pyelonephritis and UTI.  I discussed with the patient about her current condition, prognosis and CODE STATUS.  The patient initially said she does not want to be resuscitated and and then stated that she wants to be resuscitated and intubated if she has cardiopulmonary arrest but only try once. Plan:  Code Status: Full code. Time spent discussing advance care planning: 17 minutes.

## 2018-02-11 NOTE — Telephone Encounter (Signed)
error 

## 2018-02-11 NOTE — Progress Notes (Signed)
[facility] [place] NAME: Monica [place]    MR#:  [id number]  DATE OF BIRTH:  03-20-1928  SUBJECTIVE:  CHIEF COMPLAINT:  No chief complaint on file.  Generalized weakness REVIEW OF SYSTEMS:  Review of Systems  Constitutional: Positive for malaise/fatigue. Negative for chills and fever.  HENT: Negative for sore throat.   Eyes: Negative for blurred vision and double vision.  Respiratory: Negative for cough, hemoptysis, shortness of breath, wheezing and stridor.   Cardiovascular: Negative for chest pain, palpitations, orthopnea and leg swelling.  Gastrointestinal: Negative for abdominal pain, blood in stool, diarrhea, melena, nausea and vomiting.  Genitourinary: Positive for frequency and urgency. Negative for dysuria, flank pain and hematuria.  Musculoskeletal: Negative for back pain and joint pain.  Skin: Negative for rash.  Neurological: Negative for dizziness, sensory change, focal weakness, seizures, loss of consciousness, weakness and headaches.  Endo/Heme/Allergies: Negative for polydipsia.  Psychiatric/Behavioral: Negative for depression. The patient is not nervous/anxious.     DRUG ALLERGIES:   Allergies  Allergen Reactions  . Ciprofloxacin Itching  . Milk-Related Compounds Diarrhea and Nausea And Vomiting    Gas too  . Lac Bovis Diarrhea and Nausea And Vomiting    Milk allergy  . Sulfa Antibiotics Hives and Itching   VITALS:  Blood pressure 115/63, pulse 95, temperature 98.6 F (37 C), temperature source Oral, resp. rate (!) 22, weight 65.8 kg, SpO2 96 %. PHYSICAL EXAMINATION:  Physical Exam Constitutional:      General: She is not in acute distress. HENT:     Head: Normocephalic.     Mouth/Throat:     Mouth: Mucous membranes are moist.  Eyes:     General: No scleral icterus.    Conjunctiva/sclera: Conjunctivae normal.     Pupils: Pupils are equal, round, and reactive to light.  Neck:     Musculoskeletal:  Normal range of motion and neck supple.     Vascular: No JVD.     Trachea: No tracheal deviation.  Cardiovascular:     Rate and Rhythm: Normal rate and regular rhythm.     Heart sounds: Normal heart sounds. No murmur. No gallop.   Pulmonary:     Effort: Pulmonary effort is normal. No respiratory distress.     Breath sounds: Normal breath sounds. No wheezing or rales.  Abdominal:     General: Bowel sounds are normal. There is no distension.     Palpations: Abdomen is soft.     Tenderness: There is abdominal tenderness. There is no rebound.     Comments: Left-sided abdominal and flank tenderness.  Musculoskeletal: Normal range of motion.        General: No tenderness.     Right lower leg: No edema.     Left lower leg: No edema.  Skin:    Findings: No erythema or rash.  Neurological:     Mental Status: She is alert and oriented to person, place, and time.     Cranial Nerves: No cranial nerve deficit.  Psychiatric:        Mood and Affect: Mood normal.    LABORATORY PANEL:  Female CBC Recent Labs  Lab 02/11/18 0310  WBC 18.0*  HGB 11.3*  HCT 33.7*  PLT 306   ------------------------------------------------------------------------------------------------------------------ Chemistries  Recent Labs  Lab 02/10/18 1415 02/11/18 0310  NA 129* 129*  K 3.9 3.6  CL 95* 97*  CO2 22 22  GLUCOSE 221* 137*  BUN 15 15  CREATININE 1.16*  1.01*  CALCIUM 8.9 8.4*  AST 21  --   ALT 13  --   ALKPHOS 48  --   BILITOT 1.0  --    RADIOLOGY:  Dg Chest 2 View  Result Date: 02/10/2018 CLINICAL DATA:  Vomiting and fever. Patient diagnosed with urinary tract infection yesterday. EXAM: CHEST - 2 VIEW COMPARISON:  PA and lateral chest 07/05/2015. FINDINGS: Small focus of linear scar in the lingula is unchanged. Lungs otherwise clear. Heart size is normal. Aortic atherosclerosis is noted. No pneumothorax or pleural effusion. Thoracolumbar scoliosis is seen. The patient is status post vertebral  augmentation at the thoracolumbar junction. IMPRESSION: No acute disease. Atherosclerosis. Electronically Signed   By: Inge Rise M.D.   On: 02/10/2018 17:13   Dg Abdomen 1 View  Result Date: 02/10/2018 CLINICAL DATA:  Stent placement EXAM: ABDOMEN - 1 VIEW COMPARISON:  CT abdomen and pelvis 01/09/2018 FINDINGS: A left ureteral stent has been placed with proximal tip projected over the expected location of the left kidney and distal tip projected over the bladder. Bowel gas pattern is normal. Degenerative changes and scoliosis of the lumbar spine. IMPRESSION: Left ureteral stent appears in appropriate position. Electronically Signed   By: Lucienne Capers M.D.   On: 02/10/2018 19:51   Ct Abdomen Pelvis W Contrast  Result Date: 02/10/2018 CLINICAL DATA:  Fever, chills and nausea. Elevated white blood cell count. The patient status post placement of a left ureteral stent for hydronephrosis 01/30/2018. EXAM: CT ABDOMEN AND PELVIS WITH CONTRAST TECHNIQUE: Multidetector CT imaging of the abdomen and pelvis was performed using the standard protocol following bolus administration of intravenous contrast. CONTRAST:  75 mL OMNIPAQUE IOHEXOL 300 MG/ML  SOLN COMPARISON:  CT abdomen and pelvis 01/09/2018. FINDINGS: Lower chest: Mild dependent atelectasis on the left. No pleural or pericardial effusion. Hepatobiliary: No focal liver abnormality is seen. No gallstones, gallbladder wall thickening, or biliary dilatation. Pancreas: Unremarkable. No pancreatic ductal dilatation or surrounding inflammatory changes. Spleen: Normal in size without focal abnormality. Adrenals/Urinary Tract: The patient has a left double-J ureteral stent in place. The proximal loop of the stent is in the renal pelvis and the distal loop is in the urinary bladder, both in good position. There is mild left hydronephrosis with delayed contrast excretion from the left kidney compared to the right. Hydronephrosis is improved compared to the CT  before stent placement. Extensive stranding and some fluid are present about the left kidney. Walls of the left renal pelvis are thickened and enhancing. No urinary tract stones are identified. The right kidney is normal appearance. The urinary bladder is incompletely distended. Its walls are mildly thickened. The adrenal glands appear normal. Stomach/Bowel: Sigmoid diverticulosis without diverticulitis is identified. Small bowel and appendix appear normal. Small to moderate hiatal hernia noted. Vascular/Lymphatic: Aortic atherosclerosis. No enlarged abdominal or pelvic lymph nodes. Reproductive: Left ovarian cyst is identified as seen on the prior examination. The patient is status post hysterectomy. No right adnexal mass. Other: None. Musculoskeletal: No acute abnormality. The patient is status post L1 vertebral augmentation. Convex left scoliosis and multilevel degenerative disease noted. IMPRESSION: Left double-J ureteral stent is in good position. Although the left intrarenal collecting system is decompressed compared to the CT prior to stent placement, there is mild hydronephrosis and delayed contrast excretion from the left kidney relative to the right compatible with partial obstruction. Thickened and enhancing walls of the left renal pelvis may be related to stent placement but could also be secondary to infection. No decreased cortical  enhancement in the left kidney as is typically seen in pyelonephritis is present, but there is extensive stranding about the left kidney worrisome for infection. . The urinary bladder is incompletely distended but its walls appear mildly thickened as can be seen in cystitis. Diverticulosis without diverticulitis. Atherosclerosis. Left ovarian cyst. Follow-up ultrasound in 1 year is recommended is noted on report of the comparison CT Electronically Signed   By: Inge Rise M.D.   On: 02/10/2018 20:12   ASSESSMENT AND PLAN:   Monica Singh  is a very pleasant 85  y.o. female with a known history of arthritis, large cell lymphoma in remission, GERD, history of kidney stones and UTIs, hypertension who had left-sided hydronephrosis for which she had a left ureteral stent placement on 01/30/2018 presents to hospital secondary to fevers, chills and nausea.  1.  Sepsis due to pyelonephritis and acute cystitis -Follow CBC, blood and urine cultures -Started on Rocephin.  Received cefepime in the ED Worsening leukocytosis. Changed to Cipro per urologist.  But the patient complains of itch 15 min after starting Cipro IV. Change to cefepime per urologist.  2.  Recent left ureteral stent placed for hydronephrosis-continue Flomax and PRN oxybutynin No indication for procedure and continue medical treatment per Dr. Bernardo Heater.  3.  Hyponatremia-secondary to poor oral intake, hypovolemic hyponatremia.  Continue normal saline IV and follow-up BMP.  Discussed with urologist. All the records are reviewed and case discussed with Care Management/Social Worker. Management plans discussed with the patient, her daughter and they are in agreement.  CODE STATUS: Full Code  TOTAL TIME TAKING CARE OF THIS PATIENT: 26 minutes.   More than 50% of the time was spent in counseling/coordination of care: YES  POSSIBLE D/C IN 2 DAYS, DEPENDING ON CLINICAL CONDITION.   Demetrios Loll M.D on 02/11/2018 at 1:28 PM  Between 7am to 6pm - Pager - [phone]  After 6pm go to [email or web address] - [facility]

## 2018-02-11 NOTE — Progress Notes (Signed)
Notified Dr. Bridgett Larsson via text of pt's temperature. No new order received.

## 2018-02-11 NOTE — Progress Notes (Signed)
Pharmacy Antibiotic Note  Monica Singh is a 85 y.o. female admitted on 02/10/2018 with UTI.  Pharmacy has been consulted for cefepime dosing.  Plan: Cefepime 2 g IV q12h based on current renal function  Weight: 145 lb (65.8 kg)  Temp (24hrs), Avg:98.9 F (37.2 C), Min:98.1 F (36.7 C), Max:100.2 F (37.9 C)  Recent Labs  Lab 02/10/18 1415 02/10/18 1431 02/10/18 1802 02/11/18 0310  WBC 15.9*  --   --  18.0*  CREATININE 1.16*  --   --  1.01*  LATICACIDVEN  --  2.68* 1.83  --     Estimated Creatinine Clearance: 34.9 mL/min (A) (by C-G formula based on SCr of 1.01 mg/dL (H)).    Allergies  Allergen Reactions  . Ciprofloxacin Itching  . Milk-Related Compounds Diarrhea and Nausea And Vomiting    Gas too  . Lac Bovis Diarrhea and Nausea And Vomiting    Milk allergy  . Sulfa Antibiotics Hives and Itching    Antimicrobials this admission: Cefepime 1/14 x1, 1/15>> CTX 1/15>>1/15 Cipro 1/15 x1 --> itching   Dose adjustments this admission:   Microbiology results: BCx x2 NGTD UCx sent   Thank you for allowing pharmacy to be a part of this patient's care.  Rocky Morel 02/11/2018 1:50 PM

## 2018-02-12 LAB — CULTURE, URINE COMPREHENSIVE

## 2018-02-12 LAB — CBC
HCT: 32 % — ABNORMAL LOW (ref 36.0–46.0)
Hemoglobin: 10.4 g/dL — ABNORMAL LOW (ref 12.0–15.0)
MCH: 31 pg (ref 26.0–34.0)
MCHC: 32.5 g/dL (ref 30.0–36.0)
MCV: 95.5 fL (ref 80.0–100.0)
Platelets: 296 10*3/uL (ref 150–400)
RBC: 3.35 MIL/uL — ABNORMAL LOW (ref 3.87–5.11)
RDW: 12.6 % (ref 11.5–15.5)
WBC: 10.2 10*3/uL (ref 4.0–10.5)
nRBC: 0 % (ref 0.0–0.2)

## 2018-02-12 LAB — BASIC METABOLIC PANEL
Anion gap: 8 (ref 5–15)
BUN: 13 mg/dL (ref 8–23)
CO2: 25 mmol/L (ref 22–32)
Calcium: 7.8 mg/dL — ABNORMAL LOW (ref 8.9–10.3)
Chloride: 101 mmol/L (ref 98–111)
Creatinine, Ser: 0.99 mg/dL (ref 0.44–1.00)
GFR calc Af Amer: 59 mL/min — ABNORMAL LOW (ref >60)
GFR calc non Af Amer: 51 mL/min — ABNORMAL LOW (ref >60)
Glucose, Bld: 103 mg/dL — ABNORMAL HIGH (ref 70–99)
Potassium: 3.5 mmol/L (ref 3.5–5.1)
Sodium: 134 mmol/L — ABNORMAL LOW (ref 135–145)

## 2018-02-12 NOTE — Progress Notes (Signed)
[facility] [place] NAME: [place]    MR#:  [id number]  DATE OF BIRTH:  [birth year]  SUBJECTIVE:  CHIEF COMPLAINT:  No chief complaint on file.  -Feels much better today.  Denies any complaints.  Last fever was last night.  REVIEW OF SYSTEMS:  Review of Systems  Constitutional: Negative for chills and fever.  HENT: Negative for congestion, ear discharge, hearing loss and nosebleeds.   Eyes: Negative for blurred vision and double vision.  Respiratory: Negative for cough, shortness of breath and wheezing.   Cardiovascular: Negative for chest pain and palpitations.  Gastrointestinal: Negative for abdominal pain, constipation, diarrhea, nausea and vomiting.  Genitourinary: Negative for dysuria.  Musculoskeletal: Negative for myalgias.  Neurological: Negative for dizziness, focal weakness, seizures, weakness and headaches.  Psychiatric/Behavioral: Negative for depression.    DRUG ALLERGIES:   Allergies  Allergen Reactions  . Ciprofloxacin Itching  . Milk-Related Compounds Diarrhea and Nausea And Vomiting    Gas too  . Lac Bovis Diarrhea and Nausea And Vomiting    Milk allergy  . Sulfa Antibiotics Hives and Itching    VITALS:  Blood pressure 111/60, pulse 84, temperature 98.5 F (36.9 C), temperature source Oral, resp. rate 15, weight 65.8 kg, SpO2 96 %.  PHYSICAL EXAMINATION:  Physical Exam   GENERAL:  85 y.o.-year-old patient lying in the bed with no acute distress.  EYES: Pupils equal, round, reactive to light and accommodation. No scleral icterus. Extraocular muscles intact.  HEENT: Head atraumatic, normocephalic. Oropharynx and nasopharynx clear.  NECK:  Supple, no jugular venous distention. No thyroid enlargement, no tenderness.  LUNGS: Normal breath sounds bilaterally, no wheezing, rales,rhonchi or crepitation. No use of accessory muscles of respiration.  CARDIOVASCULAR: S1, S2 normal. No  rubs, or gallops.  2/6  systolic murmur is present ABDOMEN: Soft, nontender, nondistended. Bowel sounds present. No organomegaly or mass.  EXTREMITIES: No pedal edema, cyanosis, or clubbing.  NEUROLOGIC: Cranial nerves II through XII are intact. Muscle strength 5/5 in all extremities. Sensation intact. Gait not checked.  PSYCHIATRIC: The patient is alert and oriented x 3.  SKIN: No obvious rash, lesion, or ulcer.    LABORATORY PANEL:   CBC Recent Labs  Lab 02/12/18 0400  WBC 10.2  HGB 10.4*  HCT 32.0*  PLT 296   ------------------------------------------------------------------------------------------------------------------  Chemistries  Recent Labs  Lab 02/10/18 1415  02/12/18 0400  NA 129*   < > 134*  K 3.9   < > 3.5  CL 95*   < > 101  CO2 22   < > 25  GLUCOSE 221*   < > 103*  BUN 15   < > 13  CREATININE 1.16*   < > 0.99  CALCIUM 8.9   < > 7.8*  AST 21  --   --   ALT 13  --   --   ALKPHOS 48  --   --   BILITOT 1.0  --   --    < > = values in this interval not displayed.   ------------------------------------------------------------------------------------------------------------------  Cardiac Enzymes No results for input(s): TROPONINI in the last 168 hours. ------------------------------------------------------------------------------------------------------------------  RADIOLOGY:  Dg Chest 2 View  Result Date: 02/10/2018 CLINICAL DATA:  Vomiting and fever. Patient diagnosed with urinary tract infection yesterday. EXAM: CHEST - 2 VIEW COMPARISON:  PA and lateral chest 07/05/2015. FINDINGS: Small focus of linear scar in the lingula is unchanged. Lungs otherwise clear. Heart size is normal. Aortic atherosclerosis is noted.  No pneumothorax or pleural effusion. Thoracolumbar scoliosis is seen. The patient is status post vertebral augmentation at the thoracolumbar junction. IMPRESSION: No acute disease. Atherosclerosis. Electronically Signed   By: Inge Rise M.D.   On: 02/10/2018 17:13    Dg Abdomen 1 View  Result Date: 02/10/2018 CLINICAL DATA:  Stent placement EXAM: ABDOMEN - 1 VIEW COMPARISON:  CT abdomen and pelvis 01/09/2018 FINDINGS: A left ureteral stent has been placed with proximal tip projected over the expected location of the left kidney and distal tip projected over the bladder. Bowel gas pattern is normal. Degenerative changes and scoliosis of the lumbar spine. IMPRESSION: Left ureteral stent appears in appropriate position. Electronically Signed   By: Lucienne Capers M.D.   On: 02/10/2018 19:51   Ct Abdomen Pelvis W Contrast  Result Date: 02/10/2018 CLINICAL DATA:  Fever, chills and nausea. Elevated white blood cell count. The patient status post placement of a left ureteral stent for hydronephrosis 01/30/2018. EXAM: CT ABDOMEN AND PELVIS WITH CONTRAST TECHNIQUE: Multidetector CT imaging of the abdomen and pelvis was performed using the standard protocol following bolus administration of intravenous contrast. CONTRAST:  75 mL OMNIPAQUE IOHEXOL 300 MG/ML  SOLN COMPARISON:  CT abdomen and pelvis 01/09/2018. FINDINGS: Lower chest: Mild dependent atelectasis on the left. No pleural or pericardial effusion. Hepatobiliary: No focal liver abnormality is seen. No gallstones, gallbladder wall thickening, or biliary dilatation. Pancreas: Unremarkable. No pancreatic ductal dilatation or surrounding inflammatory changes. Spleen: Normal in size without focal abnormality. Adrenals/Urinary Tract: The patient has a left double-J ureteral stent in place. The proximal loop of the stent is in the renal pelvis and the distal loop is in the urinary bladder, both in good position. There is mild left hydronephrosis with delayed contrast excretion from the left kidney compared to the right. Hydronephrosis is improved compared to the CT before stent placement. Extensive stranding and some fluid are present about the left kidney. Walls of the left renal pelvis are thickened and enhancing. No urinary  tract stones are identified. The right kidney is normal appearance. The urinary bladder is incompletely distended. Its walls are mildly thickened. The adrenal glands appear normal. Stomach/Bowel: Sigmoid diverticulosis without diverticulitis is identified. Small bowel and appendix appear normal. Small to moderate hiatal hernia noted. Vascular/Lymphatic: Aortic atherosclerosis. No enlarged abdominal or pelvic lymph nodes. Reproductive: Left ovarian cyst is identified as seen on the prior examination. The patient is status post hysterectomy. No right adnexal mass. Other: None. Musculoskeletal: No acute abnormality. The patient is status post L1 vertebral augmentation. Convex left scoliosis and multilevel degenerative disease noted. IMPRESSION: Left double-J ureteral stent is in good position. Although the left intrarenal collecting system is decompressed compared to the CT prior to stent placement, there is mild hydronephrosis and delayed contrast excretion from the left kidney relative to the right compatible with partial obstruction. Thickened and enhancing walls of the left renal pelvis may be related to stent placement but could also be secondary to infection. No decreased cortical enhancement in the left kidney as is typically seen in pyelonephritis is present, but there is extensive stranding about the left kidney worrisome for infection. . The urinary bladder is incompletely distended but its walls appear mildly thickened as can be seen in cystitis. Diverticulosis without diverticulitis. Atherosclerosis. Left ovarian cyst. Follow-up ultrasound in 1 year is recommended is noted on report of the comparison CT Electronically Signed   By: Inge Rise M.D.   On: 02/10/2018 20:12    EKG:   Orders placed  or performed during the hospital encounter of 01/30/18  . EKG 12-Lead  . EKG 12-Lead  . EKG 12-Lead  . EKG 12-Lead    ASSESSMENT AND PLAN:   [place]  is a very pleasant 85 y.o. female with  a known history of arthritis, large cell lymphoma in remission, GERD, history of kidney stones and UTIs, hypertension who had left-sided hydronephrosis for which she had a left ureteral stent placement on 01/30/2018 presents to hospital secondary to fevers, chills and nausea.  1.  Sepsis-secondary to pyelonephritis and acute cystitis -Blood cultures are negative.  Urine cultures growing Pseudomonas -Currently on cefepime.  Await further sensitivities. -History of recent stent placement in CT confirming stent in place and left-sided pyelonephritis. -WBC is normalized  2.  Recent left ureteral stent placed for hydronephrosis-continue Flomax and PRN oxybutynin -Urology consult appreciated  3.  Hyponatremia-secondary to poor oral intake, hypovolemic hyponatremia -Improved with IV fluids.  4.  GERD-Protonix  5.  DVT prophylaxis-Lovenox  Patient is independent at baseline.  Please encourage ambulation as tolerated Possible discharge tomorrow.    All the records are reviewed and case discussed with Care Management/Social Workerr. Management plans discussed with the patient, family and they are in agreement.  CODE STATUS: Full code  TOTAL TIME TAKING CARE OF THIS PATIENT: 37 minutes.   POSSIBLE D/C IN 1-2 DAYS, DEPENDING ON CLINICAL CONDITION.   Gladstone Lighter M.D on 02/12/2018 at 1:34 PM  Between 7am to 6pm - Pager - [phone]  After 6pm go to [email or web address] - password [email or web address] [facility]  Office  [phone]  CC: Primary care physician; Birdie Sons, MD

## 2018-02-13 LAB — URINE CULTURE: Culture: >=100000 — AB

## 2018-02-13 MED ORDER — LEVOFLOXACIN 250 MG PO TABS: 250.0000 mg | ORAL_TABLET | Freq: Every day | ORAL | Status: DC

## 2018-02-13 MED ORDER — LEVOFLOXACIN 250 MG PO TABS: 250.0000 mg | ORAL_TABLET | Freq: Every day | ORAL | 0 refills | Status: AC

## 2018-02-13 MED ORDER — LEVOFLOXACIN 500 MG PO TABS: 500.0000 mg | ORAL_TABLET | Freq: Every day | ORAL | Status: DC

## 2018-02-13 MED ORDER — NYSTATIN 100000 UNIT/ML MT SUSP: 5.0000 mL | Freq: Four times a day (QID) | OROMUCOSAL | 0 refills | Status: AC

## 2018-02-13 MED ORDER — LEVOFLOXACIN 500 MG PO TABS
500.0000 mg | ORAL_TABLET | Freq: Once | ORAL | Status: AC
  Administered 2018-02-13: 500 mg via ORAL
  Filled 2018-02-13: qty 1

## 2018-02-13 NOTE — Discharge Summary (Signed)
[facility] [place] NAME: [place]    MR#:  [id number]  DATE OF BIRTH:  07-04-1928  DATE OF ADMISSION:  02/10/2018   ADMITTING PHYSICIAN: Gladstone Lighter, MD  DATE OF DISCHARGE:  02/13/18  PRIMARY CARE PHYSICIAN: Birdie Sons, MD   ADMISSION DIAGNOSIS:   Pyelonephritis [N12] Sepsis, due to unspecified organism, unspecified whether acute organ dysfunction present ([place]) [A41.9]  DISCHARGE DIAGNOSIS:   Active Problems:   Sepsis ([place])   SECONDARY DIAGNOSIS:   Past Medical History:  Diagnosis Date  . Adnexal mass 06/03/2014   since 2009  . Arthritis   . 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 Dyspnea    with exertion  . GERD (gastroesophageal reflux disease)   . Herpes zoster without complication 0/01/930  . History of hiatal hernia   . History of kidney stones   . HOH (hard of hearing)    wears hearing aides  . Non Hodgkin's lymphoma ([place])   . Recurrent UTI     HOSPITAL COURSE:   [place]89 y.o.femalewith a known history of arthritis, large cell lymphoma in remission, GERD, history of kidney stones and UTIs, hypertension who had left-sided hydronephrosis for which she had a left ureteral stent placement on 01/30/2018 presents to hospital secondary to fevers, chills and nausea.  1.Sepsis-secondary to pyelonephritis and acute cystitis -Blood cultures are negative.  Urine cultures growing Pseudomonas - on cefepime.  pansensitive from outpatient sensitivities. -History of recent stent placement in CT confirming stent in place and left-sided pyelonephritis. -WBC is normalized - discharge on levaquin- patient has taken cipro several times orally as outpatient. In the hospital- she developed a local reaction at the site of IV when IV cipro was given- not a true allergy. Tolerated oral levaquin  well prior to discharge.  2. Recent left ureteral stent placed for hydronephrosis-continue Flomax and PRN oxybutynin -Urology f/u after discharge  3. Hyponatremia-secondary to poor oral intake, hypovolemic hyponatremia -Improved with IV fluids.  4. GERD- on prilosec  5. Oral thrush- secondary to infec, fevers and on ABX- nystatin at discharge  Patient is independent at baseline. Please encourage ambulation as tolerated Discharge today   DISCHARGE CONDITIONS:   Guarded  CONSULTS OBTAINED:   Treatment Team:  Abbie Sons, MD Hollice Espy, MD  DRUG ALLERGIES:   Allergies  Allergen Reactions  . Ciprofloxacin Itching  . Milk-Related Compounds Diarrhea and Nausea And Vomiting    Gas too  . Lac Bovis Diarrhea and Nausea And Vomiting    Milk allergy  . Sulfa Antibiotics Hives and Itching   DISCHARGE MEDICATIONS:   Allergies as of 02/13/2018      Reactions   Ciprofloxacin Itching   Milk-related Compounds Diarrhea, Nausea And Vomiting   Gas too   Lac Bovis Diarrhea, Nausea And Vomiting   Milk allergy   Sulfa Antibiotics Hives, Itching      Medication List    STOP taking these medications   nitrofurantoin 100 MG capsule Commonly known as:  MACRODANTIN     TAKE these medications   COMBIVENT RESPIMAT 20-100 MCG/ACT Aers respimat Generic drug:  Ipratropium-Albuterol INHALE 2 PUFFS INTO THE LUNGS 3 TIMES DAILY What changed:  See the new instructions.   levofloxacin 250 MG tablet Commonly known as:  LEVAQUIN Take 1 tablet (250 mg total) by mouth daily for 5 days. Start taking on:  February 14, 2018   nystatin 100000 UNIT/ML suspension Commonly known as:  MYCOSTATIN Take 5 mLs (500,000 Units total) by mouth 4 (four) times daily for 7 days. Swish and swallow   omeprazole 20 MG capsule Commonly known as:  PRILOSEC Take 20 mg by mouth daily.   oxybutynin 5 MG tablet Commonly known as:  DITROPAN Take 1 tablet (5 mg total) by mouth every 8 (eight)  hours as needed for bladder spasms (For stent pain).   PREVAGEN PO Take 1 tablet by mouth daily.   risedronate 35 MG tablet Commonly known as:  ACTONEL TAKE 1 TABLET BY MOUTH EVERY SUNDAY AS DIRECTED What changed:  See the new instructions.   tamsulosin 0.4 MG Caps capsule Commonly known as:  FLOMAX Take 1 capsule (0.4 mg total) by mouth daily after supper.        DISCHARGE INSTRUCTIONS:   1. PCP f/u in 1-2 weeks 2. Urology f/u as scheduled  DIET:   Regular diet  ACTIVITY:   Activity as tolerated  OXYGEN:   Home Oxygen: No.  Oxygen Delivery: room air  DISCHARGE LOCATION:   home   If you experience worsening of your admission symptoms, develop shortness of breath, life threatening emergency, suicidal or homicidal thoughts you must seek medical attention immediately by calling 911 or calling your MD immediately  if symptoms less severe.  You Must read complete instructions/literature along with all the possible adverse reactions/side effects for all the Medicines you take and that have been prescribed to you. Take any new Medicines after you have completely understood and accpet all the possible adverse reactions/side effects.   Please note  You were cared for by a hospitalist during your hospital stay. If you have any questions about your discharge medications or the care you received while you were in the hospital after you are discharged, you can call the unit and asked to speak with the hospitalist on call if the hospitalist that took care of you is not available. Once you are discharged, your primary care physician will handle any further medical issues. Please note that NO REFILLS for any discharge medications will be authorized once you are discharged, as it is imperative that you return to your primary care physician (or establish a relationship with a primary care physician if you do not have one) for your aftercare needs so that they can reassess your need for  medications and monitor your lab values.    On the day of Discharge:  VITAL SIGNS:   Blood pressure 128/67, pulse 76, temperature 98 F (36.7 C), temperature source Oral, resp. rate 18, weight 65.8 kg, SpO2 93 %.  PHYSICAL EXAMINATION:   GENERAL:  85 y.o.-year-old patient lying in the bed with no acute distress.  EYES: Pupils equal, round, reactive to light and accommodation. No scleral icterus. Extraocular muscles intact.  HEENT: Head atraumatic, normocephalic. Oropharynx and nasopharynx clear.  NECK:  Supple, no jugular venous distention. No thyroid enlargement, no tenderness.  LUNGS: Normal breath sounds bilaterally, no wheezing, rales,rhonchi or crepitation. No use of accessory muscles of respiration.  CARDIOVASCULAR: S1, S2 normal. No  rubs, or gallops.  2/6 systolic murmur is present ABDOMEN: Soft, nontender, nondistended. Bowel sounds present. No organomegaly or mass.  EXTREMITIES: No pedal edema, cyanosis, or clubbing.  NEUROLOGIC: Cranial nerves II through XII are intact. Muscle strength 5/5 in all extremities. Sensation intact. Gait not checked.  PSYCHIATRIC: The patient is alert and oriented x 3.  SKIN: No obvious rash, lesion, or  ulcer.   DATA REVIEW:   CBC Recent Labs  Lab 02/12/18 0400  WBC 10.2  HGB 10.4*  HCT 32.0*  PLT 296    Chemistries  Recent Labs  Lab 02/10/18 1415  02/12/18 0400  NA 129*   < > 134*  K 3.9   < > 3.5  CL 95*   < > 101  CO2 22   < > 25  GLUCOSE 221*   < > 103*  BUN 15   < > 13  CREATININE 1.16*   < > 0.99  CALCIUM 8.9   < > 7.8*  AST 21  --   --   ALT 13  --   --   ALKPHOS 48  --   --   BILITOT 1.0  --   --    < > = values in this interval not displayed.     Microbiology Results  Results for orders placed or performed during the hospital encounter of 02/10/18  Urine Culture     Status: Abnormal   Collection Time: 02/10/18  4:11 PM  Result Value Ref Range Status   Specimen Description URINE, RANDOM  Final   Special  Requests   Final    NONE Performed at [facility], [address], [place], [place] [address]    Culture (A)  Final    >=100,000 COLONIES/mL PSEUDOMONAS AERUGINOSA 20,000 COLONIES/mL STAPHYLOCOCCUS SPECIES (COAGULASE NEGATIVE)    Report Status 02/13/2018 FINAL  Final   Organism ID, Bacteria PSEUDOMONAS AERUGINOSA (A)  Final   Organism ID, Bacteria STAPHYLOCOCCUS SPECIES (COAGULASE NEGATIVE) (A)  Final      Susceptibility   Pseudomonas aeruginosa - MIC*    CEFTAZIDIME 4 SENSITIVE Sensitive     CIPROFLOXACIN <=0.25 SENSITIVE Sensitive     GENTAMICIN <=1 SENSITIVE Sensitive     IMIPENEM 2 SENSITIVE Sensitive     PIP/TAZO 8 SENSITIVE Sensitive     CEFEPIME 2 SENSITIVE Sensitive     * >=100,000 COLONIES/mL PSEUDOMONAS AERUGINOSA   Staphylococcus species (coagulase negative) - MIC*    CIPROFLOXACIN <=0.5 SENSITIVE Sensitive     GENTAMICIN <=0.5 SENSITIVE Sensitive     NITROFURANTOIN 32 SENSITIVE Sensitive     OXACILLIN >=4 RESISTANT Resistant     TETRACYCLINE <=1 SENSITIVE Sensitive     VANCOMYCIN <=0.5 SENSITIVE Sensitive     TRIMETH/SULFA <=10 SENSITIVE Sensitive     CLINDAMYCIN <=0.25 SENSITIVE Sensitive     RIFAMPIN <=0.5 SENSITIVE Sensitive     Inducible Clindamycin NEGATIVE Sensitive     * 20,000 COLONIES/mL STAPHYLOCOCCUS SPECIES (COAGULASE NEGATIVE)  Chlamydia/NGC rt PCR ([facility] only)     Status: None   Collection Time: 02/10/18  4:11 PM  Result Value Ref Range Status   Specimen source GC/Chlam URINE, RANDOM  Final   Chlamydia Tr NOT DETECTED NOT DETECTED Final   N gonorrhoeae NOT DETECTED NOT DETECTED Final    Comment: (NOTE) This CT/NG assay has not been evaluated in patients with a history of  hysterectomy. Performed at [facility], [address] [place], [place] [address]   Blood culture (routine x 2)     Status: None (Preliminary result)   Collection Time: 02/10/18  5:46 PM  Result Value Ref Range Status   Specimen Description BLOOD LEFT  ANTECUBITAL  Final   Special Requests   Final    BOTTLES DRAWN AEROBIC AND ANAEROBIC Blood Culture adequate volume   Culture   Final    NO GROWTH 3 DAYS Performed at  [facility], [address], [place],  [address]    Report Status PENDING  Incomplete  Blood culture (routine x 2)     Status: None (Preliminary result)   Collection Time: 02/10/18  5:46 PM  Result Value Ref Range Status   Specimen Description BLOOD BLOOD RIGHT HAND  Final   Special Requests   Final    BOTTLES DRAWN AEROBIC AND ANAEROBIC Blood Culture results may not be optimal due to an inadequate volume of blood received in culture bottles   Culture   Final    NO GROWTH 3 DAYS Performed at [facility], [address], [place],  [address]    Report Status PENDING  Incomplete    RADIOLOGY:  No results found.   Management plans discussed with the patient, family and they are in agreement.  CODE STATUS:     Code Status Orders  (From admission, onward)         Start     Ordered   02/10/18 2246  Full code  Continuous     02/10/18 2246        Code Status History    Date Active Date Inactive Code Status Order ID Comments User Context   02/21/2017 1647 02/21/2017 2243 Full Code [id number]  Hessie Knows, MD Inpatient    Advance Directive Documentation     Most Recent Value  Type of Advance Directive  Living will, Healthcare Power of Attorney  Pre-existing out of facility DNR order (yellow form or pink MOST form)  -  "MOST" Form in Place?  -      TOTAL TIME TAKING CARE OF THIS PATIENT: 38 minutes.    Gladstone Lighter M.D on 02/13/2018 at 12:39 PM  Between 7am to 6pm - Pager - [phone]  After 6pm go to [email or web address] - Proofreader  [facility]  Office  [phone]  CC: Primary care physician; Birdie Sons, MD   Note: This dictation was prepared with Dragon dictation along with smaller phrase technology. Any  transcriptional errors that result from this process are unintentional.

## 2018-02-13 NOTE — Progress Notes (Signed)
Pt is being discharged home. Discharge papers given and explained to pt and daughter.  Both verbalized understanding.  Meds and f/u appointments reviewed. Rx to be picked up from pharmacy. Pt made aware.

## 2018-02-13 NOTE — Progress Notes (Addendum)
Notified Dr Tressia Miners that pt allergic to cipro, reaction listed is itching. Levaquin due.  Per Dr Tressia Miners to give Levaquin and to monitor.  Per MD pt had cipro PO in the past with no reaction.  Pt had in the hospital local reaction to cipro IV.  Pt made aware of the above and in agreement to take the Levaquin.  Pt tolerated Levaquin.  No reaction occurred.

## 2018-02-13 NOTE — Care Management Important Message (Signed)
Important Message  Patient Details  Name: Monica Singh MRN: [id number] Date of Birth: Sep 21, 1928   Medicare Important Message Given:  Yes    Juliann Pulse A Alexus Michael 02/13/2018, 11:33 AM

## 2018-02-13 NOTE — Care Management Important Message (Signed)
Important Message  Patient Details  Name: Monica Singh MRN: [id number] Date of Birth: 02/11/1928   Medicare Important Message Given:  Yes    [name] 02/13/2018, 10:39 AM

## 2018-02-15 LAB — CULTURE, BLOOD (ROUTINE X 2)
Culture: NO GROWTH
Culture: NO GROWTH
Special Requests: ADEQUATE

## 2018-02-16 ENCOUNTER — Other Ambulatory Visit: Payer: Self-pay | Admitting: Radiology

## 2018-02-16 ENCOUNTER — Telehealth: Payer: Self-pay

## 2018-02-16 DIAGNOSIS — N390 Urinary tract infection, site not specified: Secondary | ICD-10-CM

## 2018-02-16 NOTE — Telephone Encounter (Signed)
Transition Care Management Follow-up Telephone Call  Date of discharge and from where: [facility] on 02/13/18  How have you been since you were released from the hospital? Doing better, declines pain, fever or n/v/d. No new or worsening s/s.  Any questions or concerns? No   Items Reviewed:  Did the pt receive and understand the discharge instructions provided? Yes   Medications obtained and verified? Yes, new meds only.  Any new allergies since your discharge? No   Dietary orders reviewed? N/A  Do you have support at home? Yes   Other (ie: DME, Home Health, etc) N/A  Functional Questionnaire: (I = Independent and D = Dependent)  Bathing/Dressing- I   Meal Prep- I  Eating- I  Maintaining continence- I  Transferring/Ambulation- I  Managing Meds- I   Follow up appointments reviewed:    PCP Hospital f/u appt confirmed? No, pt declined a HFU apt. Cancelled the apt scheduled on 02/19/18 @ 8:00 AM for a HFU.  [facility] f/u appt confirmed? Yes   Are transportation arrangements needed? No   If their condition worsens, is the pt aware to call  their PCP or go to the ED? Yes  Was the patient provided with contact information for the PCP's office or ED? Yes  Was the pt encouraged to call back with questions or concerns? Yes

## 2018-02-17 ENCOUNTER — Other Ambulatory Visit: Payer: Medicare Other

## 2018-02-17 ENCOUNTER — Ambulatory Visit: Payer: Medicare Other | Admitting: Urology

## 2018-02-19 ENCOUNTER — Other Ambulatory Visit: Payer: Medicare Other

## 2018-02-19 ENCOUNTER — Inpatient Hospital Stay: Payer: Medicare Other | Admitting: Family Medicine

## 2018-02-19 DIAGNOSIS — N390 Urinary tract infection, site not specified: Secondary | ICD-10-CM

## 2018-02-24 ENCOUNTER — Telehealth: Payer: Self-pay | Admitting: Radiology

## 2018-02-24 NOTE — Telephone Encounter (Signed)
Daughter, [name], states patient wants to cancel surgery scheduled 02/27/2018. Per Dr Diamantina [place], informed daughter that current stent is not meant to remain long term & stent management will need to be discussed. Appointment made. Questions answered. Daughter expresses understanding of conversation.

## 2018-02-25 ENCOUNTER — Ambulatory Visit: Payer: Medicare Other | Admitting: Urology

## 2018-02-25 LAB — CULTURE, URINE COMPREHENSIVE

## 2018-02-26 ENCOUNTER — Ambulatory Visit (INDEPENDENT_AMBULATORY_CARE_PROVIDER_SITE_OTHER): Payer: Medicare Other | Admitting: Urology

## 2018-02-26 ENCOUNTER — Encounter: Payer: Self-pay | Admitting: Urology

## 2018-02-26 VITALS — BP 137/63 | HR 87 | Ht 62.0 in | Wt 143.0 lb

## 2018-02-26 DIAGNOSIS — N135 Crossing vessel and stricture of ureter without hydronephrosis: Secondary | ICD-10-CM | POA: Diagnosis not present

## 2018-02-26 MED ORDER — NITROFURANTOIN MONOHYD MACRO 100 MG PO CAPS: 100.0000 mg | ORAL_CAPSULE | Freq: Two times a day (BID) | ORAL | 0 refills | Status: DC

## 2018-02-26 MED ORDER — CEFAZOLIN SODIUM-DEXTROSE 2-4 GM/100ML-% IV SOLN: 2.0000 g | INTRAVENOUS | Status: DC

## 2018-02-26 NOTE — Progress Notes (Signed)
   02/26/2018 2:36 PM   [place] 1928-02-08 [id number]  Reason for visit: Discuss upcoming surgery  I saw Monica Singh in urology clinic to discuss her upcoming left diagnostic ureteroscopy, possible biopsy, and stent exchange.  She is an 85 year old female who presented in December 2019 with recurrent UTIs and left-sided flank pain.  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  Cytology was negative.  She underwent attempted left diagnostic ureteroscopy on 01/30/2018, however the flexible ureteroscope was unable to be passed into the collecting system, and a stent was placed for passive dilation.  Unfortunately, this was complicated by readmission 10 days later with a Pseudomonas UTI.  She does report resolution of her left-sided flank pain since she underwent stent placement.  She denies any urgency, frequency, or dysuria.  I had a very long conversation with the patient and her daughter today about the importance of following up for diagnostic left ureteroscopy.  With the possible enhancing mass concerning for malignancy, and new onset of severe left hydronephrosis over the last year, it is important to determine the etiology of the obstruction.  Furthermore, if we simply removed the stent in clinic she would be at high risk for recurrent left-sided flank pain and recurrent infections.  We discussed that our goal would be to obtain a diagnosis responsible for the obstruction of her left-sided collecting system, improve her left-sided flank pain, and prevent recurrent urinary tract infections.  Regardless of what we find, I counseled them that the most likely scenario would be chronic stent changes every 9 to 12 months with Bard Optima stents, as she would not be a good candidate for reconstruction or aggressive resection if cancer found.  -They are amenable to proceeding with left diagnostic ureteroscopy, possible  biopsy, and stent exchange with Bard Optima stent tomorrow -Nitrofurantoin 100 mg twice daily started today for low growth of staph epidermidis in urine(likely contaminant) -Consider long-term low-dose antibiotic prophylaxis  A total of 15 minutes were spent face-to-face with the patient, greater than 50% was spent in patient education, counseling, and coordination of care regarding left UPJ obstruction, possible etiologies, and need for likely long-term stent changes.  Billey Co, [facility] [address], [place], [place] [address] [phone]

## 2018-02-27 ENCOUNTER — Ambulatory Visit
Admission: RE | Admit: 2018-02-27 | Discharge: 2018-02-27 | Disposition: A | Discharge status: Home / Self Care | Payer: Medicare Other | Attending: Urology | Admitting: Urology

## 2018-02-27 ENCOUNTER — Encounter: Payer: Self-pay | Admitting: *Deleted

## 2018-02-27 ENCOUNTER — Ambulatory Visit: Payer: Medicare Other | Admitting: Certified Registered"

## 2018-02-27 ENCOUNTER — Encounter
Admission: RE | Disposition: A | Discharge status: Home / Self Care | Payer: Self-pay | Source: Home / Self Care | Attending: Urology

## 2018-02-27 ENCOUNTER — Other Ambulatory Visit: Payer: Self-pay

## 2018-02-27 DIAGNOSIS — Z87440 Personal history of urinary (tract) infections: Secondary | ICD-10-CM | POA: Diagnosis not present

## 2018-02-27 DIAGNOSIS — Z792 Long term (current) use of antibiotics: Secondary | ICD-10-CM | POA: Diagnosis not present

## 2018-02-27 DIAGNOSIS — Z881 Allergy status to other antibiotic agents status: Secondary | ICD-10-CM | POA: Insufficient documentation

## 2018-02-27 DIAGNOSIS — Z96652 Presence of left artificial knee joint: Secondary | ICD-10-CM | POA: Insufficient documentation

## 2018-02-27 DIAGNOSIS — C8330 Diffuse large B-cell lymphoma, unspecified site: Secondary | ICD-10-CM | POA: Diagnosis not present

## 2018-02-27 DIAGNOSIS — K219 Gastro-esophageal reflux disease without esophagitis: Secondary | ICD-10-CM | POA: Diagnosis not present

## 2018-02-27 DIAGNOSIS — N131 Hydronephrosis with ureteral stricture, not elsewhere classified: Secondary | ICD-10-CM | POA: Diagnosis not present

## 2018-02-27 DIAGNOSIS — Z466 Encounter for fitting and adjustment of urinary device: Secondary | ICD-10-CM | POA: Diagnosis not present

## 2018-02-27 DIAGNOSIS — J449 Chronic obstructive pulmonary disease, unspecified: Secondary | ICD-10-CM | POA: Diagnosis not present

## 2018-02-27 DIAGNOSIS — Z87891 Personal history of nicotine dependence: Secondary | ICD-10-CM | POA: Diagnosis not present

## 2018-02-27 DIAGNOSIS — Z87442 Personal history of urinary calculi: Secondary | ICD-10-CM | POA: Insufficient documentation

## 2018-02-27 DIAGNOSIS — Z79899 Other long term (current) drug therapy: Secondary | ICD-10-CM | POA: Insufficient documentation

## 2018-02-27 DIAGNOSIS — E039 Hypothyroidism, unspecified: Secondary | ICD-10-CM | POA: Diagnosis not present

## 2018-02-27 DIAGNOSIS — Z882 Allergy status to sulfonamides status: Secondary | ICD-10-CM | POA: Insufficient documentation

## 2018-02-27 DIAGNOSIS — N1330 Unspecified hydronephrosis: Secondary | ICD-10-CM | POA: Diagnosis not present

## 2018-02-27 DIAGNOSIS — N1339 Other hydronephrosis: Secondary | ICD-10-CM

## 2018-02-27 HISTORY — PX: CYSTOSCOPY WITH BIOPSY: SHX5122

## 2018-02-27 HISTORY — DX: Adverse effect of unspecified anesthetic, initial encounter: T41.45XA

## 2018-02-27 HISTORY — PX: CYSTOSCOPY WITH URETEROSCOPY AND STENT PLACEMENT: SHX6377

## 2018-02-27 HISTORY — DX: Other specified postprocedural states: Z98.890

## 2018-02-27 HISTORY — DX: Nausea with vomiting, unspecified: R11.2

## 2018-02-27 HISTORY — DX: Other complications of anesthesia, initial encounter: T88.59XA

## 2018-02-27 SURGERY — CYSTOURETEROSCOPY, WITH STENT INSERTION
Anesthesia: General | Laterality: Left

## 2018-02-27 MED ORDER — FENTANYL CITRATE (PF) 100 MCG/2ML IJ SOLN: 25.0000 ug | INTRAMUSCULAR | Status: DC | PRN

## 2018-02-27 MED ORDER — IOPAMIDOL (ISOVUE-M 200) INJECTION 41%
INTRAMUSCULAR | Status: DC | PRN
  Administered 2018-02-27: 10 mL

## 2018-02-27 MED ORDER — ONDANSETRON HCL 4 MG/2ML IJ SOLN
INTRAMUSCULAR | Status: AC
  Filled 2018-02-27: qty 2

## 2018-02-27 MED ORDER — PHENYLEPHRINE HCL 10 MG/ML IJ SOLN
INTRAMUSCULAR | Status: DC | PRN
  Administered 2018-02-27: 100 ug via INTRAVENOUS

## 2018-02-27 MED ORDER — PROPOFOL 500 MG/50ML IV EMUL
INTRAVENOUS | Status: DC | PRN
  Administered 2018-02-27: 25 ug/kg/min via INTRAVENOUS

## 2018-02-27 MED ORDER — DEXAMETHASONE SODIUM PHOSPHATE 10 MG/ML IJ SOLN
INTRAMUSCULAR | Status: AC
  Filled 2018-02-27: qty 1

## 2018-02-27 MED ORDER — LIDOCAINE HCL (PF) 2 % IJ SOLN
INTRAMUSCULAR | Status: AC
  Filled 2018-02-27: qty 10

## 2018-02-27 MED ORDER — SCOPOLAMINE 1 MG/3DAYS TD PT72
MEDICATED_PATCH | TRANSDERMAL | Status: AC
  Administered 2018-02-27: 1.5 mg via TRANSDERMAL
  Filled 2018-02-27: qty 1

## 2018-02-27 MED ORDER — LIDOCAINE HCL (CARDIAC) PF 100 MG/5ML IV SOSY
PREFILLED_SYRINGE | INTRAVENOUS | Status: DC | PRN
  Administered 2018-02-27: 100 mg via INTRAVENOUS

## 2018-02-27 MED ORDER — FENTANYL CITRATE (PF) 100 MCG/2ML IJ SOLN
INTRAMUSCULAR | Status: AC
  Filled 2018-02-27: qty 2

## 2018-02-27 MED ORDER — SUCCINYLCHOLINE CHLORIDE 20 MG/ML IJ SOLN
INTRAMUSCULAR | Status: AC
  Filled 2018-02-27: qty 1

## 2018-02-27 MED ORDER — LACTATED RINGERS IV SOLN
INTRAVENOUS | Status: DC
  Administered 2018-02-27: 13:00:00 via INTRAVENOUS

## 2018-02-27 MED ORDER — CEFAZOLIN SODIUM-DEXTROSE 2-4 GM/100ML-% IV SOLN
INTRAVENOUS | Status: AC
  Filled 2018-02-27: qty 100

## 2018-02-27 MED ORDER — ONDANSETRON HCL 4 MG/2ML IJ SOLN: 4.0000 mg | Freq: Once | INTRAMUSCULAR | Status: DC | PRN

## 2018-02-27 MED ORDER — ONDANSETRON HCL 4 MG/2ML IJ SOLN
INTRAMUSCULAR | Status: DC | PRN
  Administered 2018-02-27: 4 mg via INTRAVENOUS

## 2018-02-27 MED ORDER — SUGAMMADEX SODIUM 200 MG/2ML IV SOLN
INTRAVENOUS | Status: DC | PRN
  Administered 2018-02-27: 260 mg via INTRAVENOUS

## 2018-02-27 MED ORDER — ROCURONIUM BROMIDE 50 MG/5ML IV SOLN
INTRAVENOUS | Status: AC
  Filled 2018-02-27: qty 1

## 2018-02-27 MED ORDER — FENTANYL CITRATE (PF) 100 MCG/2ML IJ SOLN
INTRAMUSCULAR | Status: DC | PRN
  Administered 2018-02-27: 50 ug via INTRAVENOUS

## 2018-02-27 MED ORDER — PROPOFOL 10 MG/ML IV BOLUS
INTRAVENOUS | Status: AC
  Filled 2018-02-27: qty 20

## 2018-02-27 MED ORDER — SCOPOLAMINE 1 MG/3DAYS TD PT72
1.0000 | MEDICATED_PATCH | TRANSDERMAL | Status: DC
  Administered 2018-02-27: 1.5 mg via TRANSDERMAL

## 2018-02-27 MED ORDER — PROPOFOL 10 MG/ML IV BOLUS
INTRAVENOUS | Status: DC | PRN
  Administered 2018-02-27: 160 mg via INTRAVENOUS

## 2018-02-27 MED ORDER — DEXAMETHASONE SODIUM PHOSPHATE 10 MG/ML IJ SOLN
INTRAMUSCULAR | Status: DC | PRN
  Administered 2018-02-27: 10 mg via INTRAVENOUS

## 2018-02-27 MED ORDER — GLYCOPYRROLATE 0.2 MG/ML IJ SOLN
INTRAMUSCULAR | Status: AC
  Filled 2018-02-27: qty 1

## 2018-02-27 MED ORDER — SODIUM CHLORIDE 0.9 % IV SOLN
1.0000 g | Freq: Once | INTRAVENOUS | Status: AC
  Administered 2018-02-27: 1 g via INTRAVENOUS
  Filled 2018-02-27: qty 1

## 2018-02-27 MED ORDER — ROCURONIUM BROMIDE 100 MG/10ML IV SOLN
INTRAVENOUS | Status: DC | PRN
  Administered 2018-02-27: 30 mg via INTRAVENOUS

## 2018-02-27 MED ORDER — SUGAMMADEX SODIUM 200 MG/2ML IV SOLN
INTRAVENOUS | Status: AC
  Filled 2018-02-27: qty 2

## 2018-02-27 SURGICAL SUPPLY — 41 items
BAG DRAIN CYSTO-URO LG1000N (MISCELLANEOUS) ×3 IMPLANT
BRUSH SCRUB EZ  4% CHG (MISCELLANEOUS) ×2
BRUSH SCRUB EZ 1% IODOPHOR (MISCELLANEOUS) ×3 IMPLANT
BRUSH SCRUB EZ 4% CHG (MISCELLANEOUS) ×1 IMPLANT
BULB IRRIG PATHFIND (MISCELLANEOUS) IMPLANT
CATH FOL 2WAY LX 16X30 (CATHETERS) ×3 IMPLANT
CATH URETL 5X70 OPEN END (CATHETERS) IMPLANT
CNTNR SPEC 2.5X3XGRAD LEK (MISCELLANEOUS)
CONT SPEC 4OZ STER OR WHT (MISCELLANEOUS)
CONTAINER SPEC 2.5X3XGRAD LEK (MISCELLANEOUS) IMPLANT
DRAPE UTILITY 15X26 TOWEL STRL (DRAPES) ×3 IMPLANT
DRSG TELFA 4X3 1S NADH ST (GAUZE/BANDAGES/DRESSINGS) ×3 IMPLANT
ELECT REM PT RETURN 9FT ADLT (ELECTROSURGICAL) ×3
ELECTRODE REM PT RTRN 9FT ADLT (ELECTROSURGICAL) ×1 IMPLANT
FIBER LASER LITHO 273 (Laser) IMPLANT
FORCEPS BIOP PIRANHA Y (CUTTING FORCEPS) ×3 IMPLANT
GLOVE BIOGEL PI IND STRL 7.5 (GLOVE) ×1 IMPLANT
GLOVE BIOGEL PI INDICATOR 7.5 (GLOVE) ×2
GOWN STRL REUS W/ TWL LRG LVL3 (GOWN DISPOSABLE) ×1 IMPLANT
GOWN STRL REUS W/ TWL XL LVL3 (GOWN DISPOSABLE) ×1 IMPLANT
GOWN STRL REUS W/TWL LRG LVL3 (GOWN DISPOSABLE) ×2
GOWN STRL REUS W/TWL XL LVL3 (GOWN DISPOSABLE) ×2
GUIDEWIRE STR DUAL SENSOR (WIRE) ×3 IMPLANT
INFUSOR MANOMETER BAG 3000ML (MISCELLANEOUS) ×3 IMPLANT
INTRODUCER DILATOR DOUBLE (INTRODUCER) IMPLANT
KIT TURNOVER CYSTO (KITS) ×3 IMPLANT
PACK CYSTO AR (MISCELLANEOUS) ×3 IMPLANT
SET CYSTO W/LG BORE CLAMP LF (SET/KITS/TRAYS/PACK) ×3 IMPLANT
SHEATH URETERAL 12FRX35CM (MISCELLANEOUS) IMPLANT
SOL .9 NS 3000ML IRR  AL (IV SOLUTION) ×2
SOL .9 NS 3000ML IRR UROMATIC (IV SOLUTION) ×1 IMPLANT
STENT URET 6FRX24 CONTOUR (STENTS) IMPLANT
STENT URET 6FRX26 CONTOUR (STENTS) IMPLANT
STENT URO INLAY 6FRX24CM (STENTS) ×3 IMPLANT
SURGILUBE 2OZ TUBE FLIPTOP (MISCELLANEOUS) ×3 IMPLANT
SYR 10ML 18GX1 1/2 (NEEDLE) ×3 IMPLANT
SYR 10ML LL (SYRINGE) ×3 IMPLANT
TUBING ART PRESS 48 MALE/FEM (TUBING) IMPLANT
VALVE UROSEAL ADJ ENDO (VALVE) ×3 IMPLANT
WATER STERILE IRR 1000ML POUR (IV SOLUTION) ×3 IMPLANT
WATER STERILE IRR 3000ML UROMA (IV SOLUTION) ×3 IMPLANT

## 2018-02-27 NOTE — Discharge Instructions (Signed)
Ureteroscopy, Care After °This sheet gives you information about how to care for yourself after your procedure. Your health care provider may also give you more specific instructions. If you have problems or questions, contact your health care provider. °What can I expect after the procedure? °After the procedure, it is common to have: °· A burning sensation when you urinate. °· Blood in your urine. °· Mild discomfort in the bladder area or kidney area when urinating. °· Needing to urinate more often or urgently. °Follow these instructions at home: ° °Medicines °· Take over-the-counter and prescription medicines only as told by your health care provider. °· If you were prescribed an antibiotic medicine, take it as told by your health care provider. Do not stop taking the antibiotic even if you start to feel better. °General instructions °· Do not drive for 24 hours if you were given a medicine to help you relax (sedative) during your procedure. °· To relieve burning, try taking a warm bath or holding a warm washcloth over your groin. °· Drink enough fluid to keep your urine clear or pale yellow. °? Drink two 8-ounce glasses of water every hour for the first 2 hours after you get home. °? Continue to drink water often at home. °· You can eat what you usually do. °· Keep all follow-up visits as told by your health care provider. This is important. °? If you had a tube placed to keep urine flowing (ureteral stent), ask your health care provider when you need to return to have it removed. °Contact a health care provider if: °· You have chills or a fever. °· You have burning pain for longer than 24 hours after the procedure. °· You have blood in your urine for longer than 24 hours after the procedure. °Get help right away if: °· You have large amounts of blood in your urine. °· You have blood clots in your urine. °· You have very bad pain. °· You have chest pain or trouble breathing. °· You are unable to urinate and you  have the feeling of a full bladder. °This information is not intended to replace advice given to you by your health care provider. Make sure you discuss any questions you have with your health care provider. °Document Released: 01/19/2013 Document Revised: 10/31/2015 Document Reviewed: 10/27/2015 °Elsevier Interactive Patient Education © 2019 [employer] ° °AMBULATORY SURGERY  °DISCHARGE INSTRUCTIONS ° ° °1) The drugs that you were given will stay in your system until tomorrow so for the next 24 hours you should not: ° °A) Drive an automobile °B) Make any legal decisions °C) Drink any alcoholic beverage ° ° °2) You may resume regular meals tomorrow.  Today it is better to start with liquids and gradually work up to solid foods. ° °You may eat anything you prefer, but it is better to start with liquids, then soup and crackers, and gradually work up to solid foods. ° ° °3) Please notify your doctor immediately if you have any unusual bleeding, trouble breathing, redness and pain at the surgery site, drainage, fever, or pain not relieved by medication. ° ° ° °4) Additional Instructions: ° ° ° ° ° ° ° °Please contact your physician with any problems or Same Day Surgery at [phone], Monday through Friday 6 am to 4 pm, or [place] at [facility] number at [phone]. °

## 2018-02-27 NOTE — Transfer of Care (Signed)
Immediate Anesthesia Transfer of Care Note  Patient: Monica Singh  Procedure(s) Performed: CYSTOSCOPY WITH URETEROSCOPY AND STENT Exchange (Left ) CYSTOSCOPY WITH BIOPSY (Left )  Patient Location: PACU  Anesthesia Type:General  Level of Consciousness: awake, alert , oriented and patient cooperative  Airway & Oxygen Therapy: Patient Spontanous Breathing and Patient connected to face mask oxygen  Post-op Assessment: Report given to RN and Post -op Vital signs reviewed and stable  Post vital signs: Reviewed and stable  Last Vitals:  Vitals Value Taken Time  BP 142/70 02/27/2018  2:13 PM  Temp 36.2 C 02/27/2018  2:13 PM  Pulse 87 02/27/2018  2:17 PM  Resp 13 02/27/2018  2:17 PM  SpO2 100 % 02/27/2018  2:17 PM  Vitals shown include unvalidated device data.  Last Pain:  Vitals:   02/27/18 1227  TempSrc: Tympanic  PainSc: 0-No pain         Complications: No apparent anesthesia complications, denies nausea, denies pain

## 2018-02-27 NOTE — Anesthesia Post-op Follow-up Note (Signed)
Anesthesia QCDR form completed.        

## 2018-02-27 NOTE — Anesthesia Procedure Notes (Signed)
Procedure Name: Intubation Date/Time: 02/27/2018 1:40 PM Performed by: Kelton Pillar, CRNA Pre-anesthesia Checklist: Patient identified, Emergency Drugs available, Suction available and Patient being monitored Patient Re-evaluated:Patient Re-evaluated prior to induction Oxygen Delivery Method: Circle system utilized Preoxygenation: Pre-oxygenation with 100% oxygen Induction Type: IV induction Ventilation: Mask ventilation without difficulty Laryngoscope Size: Mac and 3 Grade View: Grade I Tube type: Oral Tube size: 7.0 mm Airway Equipment and Method: Stylet Placement Confirmation: ETT inserted through vocal cords under direct vision,  positive ETCO2,  CO2 detector and breath sounds checked- equal and bilateral Tube secured with: Tape Dental Injury: Teeth and Oropharynx as per pre-operative assessment

## 2018-02-27 NOTE — Anesthesia Postprocedure Evaluation (Signed)
Anesthesia Post Note  Patient: Monica Singh  Procedure(s) Performed: CYSTOSCOPY WITH URETEROSCOPY AND STENT Exchange (Left ) CYSTOSCOPY WITH BIOPSY (Left )  Patient location during evaluation: PACU Anesthesia Type: General Level of consciousness: awake and alert Pain management: pain level controlled Vital Signs Assessment: post-procedure vital signs reviewed and stable Respiratory status: spontaneous breathing and respiratory function stable Cardiovascular status: stable Anesthetic complications: no     Last Vitals:  Vitals:   02/27/18 1428 02/27/18 1441  BP: 139/76 139/68  Pulse: 90   Resp: 10 18  Temp:  36.5 C  SpO2: 97%     Last Pain:  Vitals:   02/27/18 1441  TempSrc:   PainSc: 0-No pain                 Surena Welge K

## 2018-02-27 NOTE — Progress Notes (Signed)
Foley catheter removed per MD order . 150 ml straw colored urine emptied.

## 2018-02-27 NOTE — Op Note (Signed)
Date of procedure: 02/27/18  Preoperative diagnosis:  1. Left hydronephrosis and UPJ obstruction  Postoperative diagnosis:  1. Same  Procedure: 1. Cystoscopy, left retrograde pyelogram with intraoperative interpretation, left diagnostic ureteroscopy, stent exchange  Surgeon: Nickolas Madrid, MD  Anesthesia: General  Complications: None  Intraoperative findings:  1.  Normal cystoscopy 2.  Left ureter widely patent without any lesions or tumors 3.  Left diagnostic ureteroscopy with no abnormalities within the collecting system 4.  Narrowing with 90 degree turn at UPJ with upstream hydronephrosis, no intrinsic obstruction 5.  Uncomplicated left 6 [place] by 24 cm Bard Optima stent placement  EBL: None  Specimens: None  Drains: Left 6 French by 24 cm Bard Optima stent  Indication: [place] H Swamy is a 85 y.o. patient with new onset of left hydronephrosis to the UPJ with possible enhancing lesion, as well as recurrent urinary tract infections and left-sided flank pain.  She presents today for definitive diagnosis with diagnostic left ureteroscopy.  After reviewing the management options for treatment, they elected to proceed with the above surgical procedure(s). We have discussed the potential benefits and risks of the procedure, side effects of the proposed treatment, the likelihood of the patient achieving the goals of the procedure, and any potential problems that might occur during the procedure or recuperation. Informed consent has been obtained.  Description of procedure:  The patient was taken to the operating room and general anesthesia was induced.  The patient was placed in the dorsal lithotomy position, prepped and draped in the usual sterile fashion, and preoperative antibiotics(cefepime) were administered. A preoperative time-out was performed.   The 21 French rigid cystoscope was inserted into the urethra.  The bladder was grossly normal.  A sensor wire was passed  alongside her current left-sided indwelling ureteral stent up to the collecting system under fluoroscopic vision.  The left-sided stent was then pulled to the meatus and an additional safety wire was passed through the stent into the collecting system.  The flexible single-channel ureteroscope then passed easily over the wire up into the kidney.  Thorough pyeloscopy revealed no concerning lesions and a dilated system.  Careful pullback ureteroscopy demonstrated narrowing at the UPJ with a 90 degree turn, however there were no intrinsic lesions or concerns for abnormal urothelium.  Retrograde pyelogram from just below the UPJ showed narrowing with upstream hydronephrosis.  Careful pullback ureteroscopy demonstrated no concerning findings in the ureter, and it was widely patent aside from the UPJ.  A 6 French by 24 cm Bard Optima stent was placed fluoroscopically with an excellent curl in the renal pelvis.  Rigid cystoscopy was again performed and showed an excellent curl in the bladder.  A 16 French was placed with 10 cc in the balloon to dependent drainage for 1 to 2 hours postop to minimize the risk of infection.   Disposition: Stable to PACU  Plan: Remove Foley prior to discharge today Continue course of nitrofurantoin antibiotics Follow-up in clinic in 4 to 6 weeks for symptom check Likely plan for chronic stent changes every 9 to 12 months with light sedation in the OR  Nickolas Madrid, MD

## 2018-02-27 NOTE — Anesthesia Preprocedure Evaluation (Signed)
Anesthesia Evaluation  Patient identified by MRN, date of birth, ID band Patient awake    Reviewed: Allergy & Precautions, NPO status , Patient's Chart, lab work & pertinent test results  History of Anesthesia Complications (+) PONV  Airway Mallampati: II       Dental  (+) Upper Dentures   Pulmonary neg sleep apnea, COPD,  COPD inhaler, former smoker,           Cardiovascular (-) hypertension(-) Past MI and (-) CHF (-) dysrhythmias (-) Valvular Problems/Murmurs     Neuro/Psych neg Seizures    GI/Hepatic Neg liver ROS, hiatal hernia, GERD  Medicated and Controlled,  Endo/Other  neg diabetesHypothyroidism   Renal/GU Renal disease (stones, UPJ obstructuion)     Musculoskeletal   Abdominal   Peds  Hematology   Anesthesia Other Findings   Reproductive/Obstetrics                             Anesthesia Physical Anesthesia Plan  ASA: III  Anesthesia Plan: General   Post-op Pain Management:    Induction: Intravenous  PONV Risk Score and Plan: 4 or greater and Dexamethasone, Ondansetron, Propofol infusion and Midazolam  Airway Management Planned: Oral ETT  Additional Equipment:   Intra-op Plan:   Post-operative Plan:   Informed Consent: I have reviewed the patients History and Physical, chart, labs and discussed the procedure including the risks, benefits and alternatives for the proposed anesthesia with the patient or authorized representative who has indicated his/her understanding and acceptance.       Plan Discussed with:   Anesthesia Plan Comments:         Anesthesia Quick Evaluation

## 2018-02-27 NOTE — H&P (Signed)
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  She presented with recurrent urinary tract infections and left-sided flank pain.  She previously underwent attempted left ureteroscopy on 01/30/2018, however we were unable to pass the flexible ureteroscope past the mid ureter.  It was placed for passive dilation.  Her left-sided flank pain has totally resolved with stent placement.  She does not have any irritation or pain from the stent.  Unfortunately, she was admitted for Pseudomonas UTI on 02/10/2018.  She feels well with no chest pain, shortness of breath, or flank pain, and elects to proceed with left ureteroscopy for definitive diagnosis today, and stent placement.   PMH: Past Medical History:  Diagnosis Date  . Adnexal mass 06/03/2014   since 2009  . Arthritis   . Complication of anesthesia   . 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 Dyspnea    with exertion  . GERD (gastroesophageal reflux disease)   . Herpes zoster without complication 02/04/8414  . History of hiatal hernia   . History of kidney stones   . HOH (hard of hearing)    wears hearing aides  . Non Hodgkin's lymphoma ([place])   . PONV (postoperative nausea and vomiting)   . Recurrent UTI     Surgical History: Past Surgical History:  Procedure Laterality Date  . ABDOMINAL SURGERY  2009   abdominal mass+ [place] lymphoma,  . BACK SURGERY  01/2017   fusion. metal plate in neck at back  . CATARACT EXTRACTION  1997   right eye and left ey  . cervical neck fusion  1995  . CYSTOSCOPY W/ RETROGRADES Left 01/30/2018   Procedure: CYSTOSCOPY WITH RETROGRADE PYELOGRAM;  Surgeon: Billey Co, MD;  Location: [facility] ORS;  Service: Urology;  Laterality:  Left;  . CYSTOSCOPY WITH STENT PLACEMENT Left 01/30/2018   Procedure: CYSTOSCOPY WITH STENT PLACEMENT;  Surgeon: Billey Co, MD;  Location: [facility] ORS;  Service: Urology;  Laterality: Left;  . DENTAL SURGERY     screws  . KYPHOPLASTY N/A 02/21/2017   Procedure: [id number];  Surgeon: Hessie Knows, MD;  Location: [facility] ORS;  Service: Orthopedics;  Laterality: N/A;  . laparotomy with biopsy  03/02/2007  . PORTACATH PLACEMENT  2009  . [place]  . SQUAMOUS CELL CARCINOMA EXCISION     right arm  . TOTAL KNEE ARTHROPLASTY Left    2009  . URETEROSCOPY Left 01/30/2018   Procedure: URETEROSCOPY;  Surgeon: Billey Co, MD;  Location: [facility] ORS;  Service: Urology;  Laterality: Left;  [place] Kitchen VAGINAL HYSTERECTOMY  1971    Allergies:  Allergies  Allergen Reactions  . Ciprofloxacin Itching  . Milk-Related Compounds Diarrhea and Nausea And Vomiting    Gas too  . Lac Bovis Diarrhea and Nausea And Vomiting    Milk allergy  . Sulfa Antibiotics Hives and Itching    Family History: Family History  Problem Relation Age of Onset  . Breast cancer Sister   . Dementia Sister   . Cataracts Sister   . Heart attack Brother   . CAD Brother   . Heart attack Brother   . Leukemia Grandchild        granddaughter  . Kidney disease Neg Hx   .  Bladder Cancer Neg Hx     Social History:  reports that she quit smoking about 28 years ago. Her smoking use included cigarettes. She smoked 0.25 packs per day. She quit smokeless tobacco use about 29 years ago. She reports current alcohol use of about 14.0 standard drinks of alcohol per week. She reports that she does not use drugs.  ROS: Negative aside from those stated in HPI  Physical Exam: BP (!) 143/99   Pulse 85   Temp 97.6 F (36.4 C) (Tympanic)   Resp 18   Ht 5\' 2"  (1.575 m)   Wt 64.9 kg   BMI 26.15 kg/m    Constitutional:  Alert and oriented, No acute distress. Cardiovascular: Regular rate and rhythm Respiratory: Clear to  auscultation bilaterally GI: Abdomen is soft, nontender, nondistended, no abdominal masses GU: No CVA tenderness Lymph: No cervical or inguinal lymphadenopathy. Skin: No rashes, bruises or suspicious lesions. Neurologic: Grossly intact, no focal deficits, moving all 4 extremities. Psychiatric: Normal mood and affect.  Laboratory Data: Urine culture 1/23 with 20K-50 K staph epidermidis, likely contaminant.  Has been on culture appropriate antibiotics for >48 hours  Assessment & Plan:  In summary, the patient is an 85 year old female with new onset of left severe hydronephrosis to the UPJ with possible enhancing proximal ureteral mass, left-sided flank pain, and recurrent left UTIs.  She is here today for definitive diagnosis with left diagnostic ureteroscopy, possible biopsy, and stent exchange.  Gust with her age and co-morbidities the most likely strategy will be long-term management with stent exchanges with Bard Optima stents.  We discussed the risks of bleeding, infection, sepsis, need for additional procedures, likely need for long-term stent changes, ureteral injury, and possible temporary catheter placement.  Proceed with left diagnostic ureteroscopy, possible biopsy, stent exchange    Billey Co, MD  [facility] [address], [place], [place] [address] [phone]

## 2018-03-23 ENCOUNTER — Ambulatory Visit: Payer: Medicare Other

## 2018-03-25 ENCOUNTER — Other Ambulatory Visit: Payer: Self-pay

## 2018-03-25 ENCOUNTER — Inpatient Hospital Stay: Payer: Medicare Other | Attending: Obstetrics and Gynecology | Admitting: Obstetrics and Gynecology

## 2018-03-25 VITALS — BP 171/80 | HR 102 | Temp 97.5°F | Resp 18 | Wt 147.0 lb

## 2018-03-25 DIAGNOSIS — Z87891 Personal history of nicotine dependence: Secondary | ICD-10-CM | POA: Insufficient documentation

## 2018-03-25 DIAGNOSIS — Z8572 Personal history of non-Hodgkin lymphomas: Secondary | ICD-10-CM | POA: Insufficient documentation

## 2018-03-25 DIAGNOSIS — Z79899 Other long term (current) drug therapy: Secondary | ICD-10-CM | POA: Diagnosis not present

## 2018-03-25 DIAGNOSIS — E039 Hypothyroidism, unspecified: Secondary | ICD-10-CM | POA: Diagnosis not present

## 2018-03-25 DIAGNOSIS — K219 Gastro-esophageal reflux disease without esophagitis: Secondary | ICD-10-CM | POA: Insufficient documentation

## 2018-03-25 DIAGNOSIS — M5030 Other cervical disc degeneration, unspecified cervical region: Secondary | ICD-10-CM | POA: Insufficient documentation

## 2018-03-25 DIAGNOSIS — M5136 Other intervertebral disc degeneration, lumbar region: Secondary | ICD-10-CM | POA: Diagnosis not present

## 2018-03-25 DIAGNOSIS — K449 Diaphragmatic hernia without obstruction or gangrene: Secondary | ICD-10-CM | POA: Insufficient documentation

## 2018-03-25 DIAGNOSIS — N83202 Unspecified ovarian cyst, left side: Secondary | ICD-10-CM | POA: Insufficient documentation

## 2018-03-25 DIAGNOSIS — J449 Chronic obstructive pulmonary disease, unspecified: Secondary | ICD-10-CM | POA: Diagnosis not present

## 2018-03-25 DIAGNOSIS — Z90710 Acquired absence of both cervix and uterus: Secondary | ICD-10-CM | POA: Diagnosis not present

## 2018-03-25 DIAGNOSIS — M810 Age-related osteoporosis without current pathological fracture: Secondary | ICD-10-CM | POA: Insufficient documentation

## 2018-03-25 DIAGNOSIS — N83209 Unspecified ovarian cyst, unspecified side: Secondary | ICD-10-CM

## 2018-03-25 NOTE — Progress Notes (Signed)
Here for follow up.. per pt " feeling tired most of the time "  Repeat BP 125/73  p 102. Beckey Rutter  NP informed

## 2018-03-25 NOTE — Progress Notes (Signed)
Gynecologic Oncology Interval Note  Referring MD: Dr. Forest Gleason- Dr. Janese Banks  PCP MD: Margarita Rana, MD [address] [place], [place] [address] [phone]  Chief Complaint: Ovarian cyst surveillance  Subjective:  Monica Singh is a 85 y.o. woman, who returns to clinic for continued surveillance for history of ovarian cyst.   Exam on 03/26/17 revealed stable asymptomatic ovarian cyst. In interim, she has had left ureteral stent placed for hydronephrosis . CT imaging on 01/09/2018 measured left ovarian cyst at 5.2 cm with small peripheral area of calcification.   Today, she reports overall feeling at baseline. She has ongoing fatigue and shortness of breath with activity. She is accompanied by her daughter who contributes to history.   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 Janese Banks with medical-oncology. She had been seen by Dr. Sabra Heck for several years for an adnexal mass has been present since 2009. She is status post hysterectomy. Her history is as follows:  CA-125 on 01/2011 was 12.4  03/01/2011 she had an ultrasound compared to 2011. Within the left neck for region there is a cystic mass measuring 2.1 x 3.98 x 3.2 cm. What appeared to be the ovary included a cyst measuring 4.3 x 4.32 x 3.32 cm.   CA-125 on 03/2011 was 13.0  Repeat ultrasound 09/03/2011 the left ovary measured 4.2 x 3.1 x 3.4 cm. The cystic mass measured 3.2 x 2.5 x 2.9 cm. The cystic mass was relatively anechoic without a dominant area of nodularity. There may be mild thickening of the wall but similar to prior.   CA-125 on 09/2011 was 11.3.   Ultrasound on 10/08/2012 on the left ovary demonstrated and measured 4.5 x 4.1 x 3.5 cm and contained a 4 x 3.2 x 3.7 cm cystic area there is persistent solid and cystic adnexal process presumed to reflect ovary and associated ovarian  cyst.  06/13/2014 ultrasound  compared to 11/26/2013 and 10/08/2012  was reasssuring as noted below. Her CA125 was 11.7. Measurements: 4.5 x 3.9 x 3.6 cm. There is an anechoic structure measuring 3.6 x 3.6 x 3.4 cm which is slightly smaller than on the previous study. Previous dimensions of this anechoic structure were 4.4 x 3.4 x 3.4 cm IMPRESSION: 1. Slight interval decrease in the size of the hypoechoic slightly irregularly marginated left ovarian cystic structure. 2. The right ovary could not be demonstrated. The uterus is surgically absent.  06/13/2014 Pelvic ultrasound Left ovary Measurements: Left ovary not definitively seen. Simple appearing adnexal cyst measuring 4.2 x 4.4 x 3.9 cm. This is unchanged in size and appearance since prior study  03/25/2017- Pelvic ultrasound - Uterus Hysterectomy. Right ovary Not visualized Left ovary Not well visualized. A 4.2 x 3.6 x 2.7 cm cyst, most likely simple, is again noted in the left adnexal Other findings:  No abnormal free fluid. IMPRESSION: A 4.2 x 3.6 x 2.7 cm cyst, most likely simple, is again noted in the left adnexal region. Similar finding noted on prior exam. No significant interim change. This is almost certainly benign, but follow up ultrasound is recommended in 1 year according to the Society of Radiologists in [place] Statement (D Clovis Riley et al. Management of Asymptomatic Ovarian and Other Adnexal Cysts Imaged at [place]: Society of Radiologists in [place] Statement 2010. Radiology 256 (Sept 2010): 235-361.).   On 03/26/17 - She states  her recent pain in her left pelvis/left hip area has resolved. Pain occurred 3-4 days ago rated 9/10 causing her to dry heave. It presented spontaneously and resolved spontaneously. It last 2 days. She has recently had back surgery after a fall at home.    We reviewed, "A recent expert review suggested that low-risk abnormalities can undergo an initial 44-month  follow-up, with those that remain stable or decreasing in size being examined every 12 months for 5 years." -Eulah Citizen RW. Evaluation and management of ultrasonographically detected ovarian tumors in asymptomatic women. Obstet Gynecol 2016; 102:[phone].    Problem List: Patient Active Problem List   Diagnosis Date Noted  . Obstruction of left ureteropelvic junction (UPJ) 02/26/2018  . Sepsis ([place]) 02/10/2018  . Closed burst fracture of lumbar vertebra with routine healing 04/01/2017  . Chronic bronchitis ([place]) 02/18/2017  . Osteoporosis 11/20/2015  . COPD (chronic obstructive pulmonary disease) ([place]) 11/20/2015  . Cyst of ovary 07/26/2015  . Status post total left knee replacement 04/03/2015  . Abnormal chest sounds 08/25/2014  . Allergic rhinitis 08/25/2014  . Colon polyp 08/25/2014  . DDD (degenerative disc disease), cervical 08/25/2014  . DDD (degenerative disc disease), lumbar 08/25/2014  . H/O non-Hodgkin's lymphoma 08/25/2014  . Below normal amount of sodium in the blood 08/25/2014  . Arthritis of knee, degenerative 08/25/2014  . Subclinical hypothyroidism 08/25/2014  . Adnexal mass 06/03/2014  . 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 History of smoking 03/11/2008    Past Medical History: Past Medical History:  Diagnosis Date  . Adnexal mass 06/03/2014   since 2009  . Arthritis   . Complication of anesthesia   . Diffuse large cell lymphoma in remission ([place]) 01/2007   NON-HODGKINS-stage 3, cd 20 positive; status post 6 cycles of R-CHOP  . Dyspnea    with exertion  . GERD (gastroesophageal reflux disease)   . Herpes zoster without complication 08/29/4233  . History of hiatal hernia   . History of kidney stones   . HOH (hard of hearing)    wears hearing aides  . Non Hodgkin's lymphoma ([place])   . PONV (postoperative nausea and vomiting)   . Recurrent UTI     Past Surgical History: Past Surgical History:  Procedure Laterality Date  . ABDOMINAL  SURGERY  2009   abdominal mass+ NH lymphoma,  . BACK SURGERY  01/2017   fusion. metal plate in neck at back  . CATARACT EXTRACTION  1997   right eye and left ey  . cervical neck fusion  1995  . CYSTOSCOPY W/ RETROGRADES Left 01/30/2018   Procedure: CYSTOSCOPY WITH RETROGRADE PYELOGRAM;  Surgeon: Billey Co, MD;  Location: [facility];  Service: Urology;  Laterality: Left;  . CYSTOSCOPY WITH BIOPSY Left 02/27/2018   Procedure: CYSTOSCOPY WITH BIOPSY;  Surgeon: Billey Co, MD;  Location: [facility];  Service: Urology;  Laterality: Left;  . CYSTOSCOPY WITH STENT PLACEMENT Left 01/30/2018   Procedure: CYSTOSCOPY WITH STENT PLACEMENT;  Surgeon: Billey Co, MD;  Location: [facility];  Service: Urology;  Laterality: Left;  . CYSTOSCOPY WITH URETEROSCOPY AND STENT PLACEMENT Left 02/27/2018   Procedure: CYSTOSCOPY WITH URETEROSCOPY AND STENT Exchange;  Surgeon: Billey Co, MD;  Location: [facility];  Service: Urology;  Laterality: Left;  . DENTAL SURGERY     screws  . KYPHOPLASTY N/A 02/21/2017   Procedure: [id number];  Surgeon: Hessie Knows, MD;  Location: [facility];  Service: Orthopedics;  Laterality: N/A;  . laparotomy with  biopsy  03/02/2007  . PORTACATH PLACEMENT  2009  . [place]  . SQUAMOUS CELL CARCINOMA EXCISION     right arm  . TOTAL KNEE ARTHROPLASTY Left    2009  . URETEROSCOPY Left 01/30/2018   Procedure: URETEROSCOPY;  Surgeon: Billey Co, MD;  Location: [facility];  Service: Urology;  Laterality: Left;  Marland Kitchen VAGINAL HYSTERECTOMY  1971    Family History: Family History  Problem Relation Age of Onset  . Breast cancer Sister   . Dementia Sister   . Cataracts Sister   . Heart attack Brother   . CAD Brother   . Heart attack Brother   . Leukemia Grandchild        granddaughter  . Kidney disease Neg Hx   . Bladder Cancer Neg Hx     Social History: Social History   Socioeconomic History  . Marital status: Widowed    Spouse name: Not on file  .  Number of children: 5  . Years of education: Not on file  . Highest education level: GED or equivalent  Occupational History  . Occupation: retired    Comment: homemaker  Social Needs  . Financial resource strain: Not hard at all  . Food insecurity:    Worry: Never true    Inability: Never true  . Transportation needs:    Medical: No    Non-medical: No  Tobacco Use  . Smoking status: Former Smoker    Packs/day: 0.25    Types: Cigarettes    Last attempt to quit: 06/21/1989    Years since quitting: 28.7  . Smokeless tobacco: Former Systems developer    Quit date: 01/28/1989  Substance and Sexual Activity  . Alcohol use: Yes    Alcohol/week: 14.0 standard drinks    Types: 14 Glasses of wine per week  . Drug use: No  . Sexual activity: Not Currently  Lifestyle  . Physical activity:    Days per week: 0 days    Minutes per session: 0 min  . Stress: Not at all  Relationships  . Social connections:    Talks on phone: More than three times a week    Gets together: More than three times a week    Attends religious service: More than 4 times per year    Active member of club or organization: Yes    Attends meetings of clubs or organizations: More than 4 times per year    Relationship status: Widowed  . Intimate partner violence:    Fear of current or ex partner: No    Emotionally abused: No    Physically abused: Not on file    Forced sexual activity: No  Other Topics Concern  . Not on file  Social History Narrative   Ambulates independently, lives at home by herself    Allergies: Allergies  Allergen Reactions  . Ciprofloxacin Itching  . Milk-Related Compounds Diarrhea and Nausea And Vomiting    Gas too  . Lac Bovis Diarrhea and Nausea And Vomiting    Milk allergy  . Sulfa Antibiotics Hives and Itching    Current Medications: Current Outpatient Medications  Medication Sig Dispense Refill  . Apoaequorin (PREVAGEN PO) Take 1 tablet by mouth daily.    . COMBIVENT RESPIMAT 20-100  MCG/ACT AERS respimat INHALE 2 PUFFS INTO THE LUNGS 3 TIMES DAILY (Patient taking differently: Inhale 1 puff into the lungs daily. ) 4 g 5  . risedronate (ACTONEL) 35 MG tablet TAKE 1 TABLET BY MOUTH  EVERY SUNDAY AS DIRECTED (Patient taking differently: Take 35 mg by mouth every Sunday. ) 4 tablet 12  . omeprazole (PRILOSEC) 20 MG capsule Take 20 mg by mouth daily.      No current facility-administered medications for this visit.    Review of Systems General:  fatigue Skin: no complaints Eyes: no complaints HEENT: no complaints Breasts: no complaints Pulmonary: shortness of breath Cardiac: no complaints Gastrointestinal: no complaints Genitourinary/Sexual: no complaints Ob/Gyn: no complaints Musculoskeletal: no complaints Hematology: no complaints Neurologic/Psych: no complaints   Objective:  BP (!) 171/80 (BP Location: Left Arm, Patient Position: Sitting)   Pulse (!) 102 Comment: manually  Temp (!) 97.5 F (36.4 C) (Oral)   Resp 18   Wt 147 lb (66.7 kg)   BMI 26.89 kg/m    ECOG Performance Status: 0 - Asymptomatic   GENERAL: Patient is a well appearing female in no acute distress LUNGS:  Clear to auscultation bilaterally. HEART:  Regular rate and rhythm.  ABDOMEN:  Soft, nontender.  No hernias, incisions well healed. No masses or ascites EXTREMITIES:  No peripheral edema. Atraumatic. No cyanosis SKIN:  Clear with no obvious rashes or skin changes.  NEURO:  Nonfocal. Well oriented.  Appropriate affect.  Pelvic: Exam chaperoned by nursing;  Vulva: normal appearing vulva with no masses, tenderness or lesions; Vagina: normal vagina; Adnexa: normal adnexa in size, nontender and no masses; no tenderness with deep palpation. Uterus: surgically absent, vaginal cuff well healed; Cervix: absent; Rectal: confirmatory  Lab Review  Lab Results  Component Value Date   CA125 11.7 06/22/2014   CA125 11.1 11/24/2013   CA125 11.3 10/14/2011    Assessment:  Charlottie H Townley is  a 85 y.o. female with an ovarian cyst, asymptomatic and stable.  Episode of left pelvic/hip pain of uncertain etiology, now resolved.   S/p ureteral stent for UPJ stenosis (at renal pelvis) and UTI/sepsis  Plan:   Problem List Items Addressed This Visit      Endocrine   Cyst of ovary - Primary     Confirmed with Urology and ureteral obstruction is not at the site of the cyst.   Continue surveillance repeat pelvic ultrasound and exam in 12 months.    Suggested return to clinic in  12 months.  I personally reviewed the patient's history, completed key elements of her exam, and was involved in decision making in conjunction with Ms. Allen.   A total of at least 20 minutes were spent with the patient/family today; >50% was spent in education, counseling and coordination of care for ovarian cyst. Additional time needed for independent review of films, review films with patient and daughter, and discussion with Zara Council, PA, Urology.   Tristan Proto Gaetana Michaelis, MD     CC:  Margarita Rana, MD [address] [place], [place] [address] [phone]  Rachelle Hora PA, Orthopedics

## 2018-04-08 ENCOUNTER — Ambulatory Visit (INDEPENDENT_AMBULATORY_CARE_PROVIDER_SITE_OTHER): Payer: Medicare Other | Admitting: Urology

## 2018-04-08 ENCOUNTER — Other Ambulatory Visit: Payer: Self-pay

## 2018-04-08 ENCOUNTER — Encounter: Payer: Self-pay | Admitting: Urology

## 2018-04-08 VITALS — BP 146/79 | HR 91 | Ht 62.0 in | Wt 146.0 lb

## 2018-04-08 DIAGNOSIS — N135 Crossing vessel and stricture of ureter without hydronephrosis: Secondary | ICD-10-CM

## 2018-04-08 NOTE — Progress Notes (Signed)
   04/08/2018 4:50 PM   [place] 12/09/1928 [id number]  Reason for visit: Follow up left UPJ obstruction  HPI: I saw [name] back in urology clinic for follow-up.  Briefly, she is an 85 year old female who is having recurrent urinary tract infections as well as left-sided flank pain and was ultimately found to have new onset of a left idiopathic UPJ obstruction with moderate to severe upstream hydronephrosis.  Diagnostic ureteroscopy 02/27/2018 did not show any concerning lesions, and cytology was negative.  She underwent placement of a left ureteral stent with complete resolution of her left-sided pain, and has not had any further UTIs.  She is very happy with how she is doing currently and denies any stent related symptoms including urgency/frequency, urinary incontinence, or flank plain.  We discussed options moving forward, and I recommended chronic stent exchanges every 6 to 12 months based on her encrustation rate.  I recommended for the first change we aim for around 6 months from her original stent placement.  We also discussed possible placement of the new Hickman stent to potentially decrease encrustation and minimize her stent changes.  We discussed the risks of infection with the stent in place, and the need for close follow-up if she developed fever over 101.3 or UTI symptoms.  Finally, the patient and her family are concerned about memory problems from anesthesia, and will try to change her stent with light sedation in the OR.  Schedule cystoscopy left ureteral stent change in the OR with [facility] in July 2020  A total of 10 minutes were spent face-to-face with the patient, greater than 50% was spent in patient education, counseling, and coordination of care regarding left UPJ obstruction and chronic stent changes.   Billey Co, [facility] [address] [place], [place] [address] [phone]

## 2018-04-08 NOTE — Patient Instructions (Signed)
Ureteral Stent Implantation, Care After °Refer to this sheet in the next few weeks. These instructions provide you with information about caring for yourself after your procedure. Your health care provider may also give you more specific instructions. Your treatment has been planned according to current medical practices, but problems sometimes occur. Call your health care provider if you have any problems or questions after your procedure. °What can I expect after the procedure? °After the procedure, it is common to have: °· Nausea. °· Mild pain when you urinate. You may feel this pain in your lower back or lower abdomen. Pain should stop within a few minutes after you urinate. This may last for up to 1 week. °· A small amount of blood in your urine for several days. °Follow these instructions at home: ° °Medicines °· Take over-the-counter and prescription medicines only as told by your health care provider. °· If you were prescribed an antibiotic medicine, take it as told by your health care provider. Do not stop taking the antibiotic even if you start to feel better. °· Do not drive for 24 hours if you received a sedative. °· Do not drive or operate heavy machinery while taking prescription pain medicines. °Activity °· Return to your normal activities as told by your health care provider. Ask your health care provider what activities are safe for you. °· Do not lift anything that is heavier than 10 lb (4.5 kg). Follow this limit for 1 week after your procedure, or for as long as told by your health care provider. °General instructions °· Watch for any blood in your urine. Call your health care provider if the amount of blood in your urine increases. °· If you have a catheter: °? Follow instructions from your health care provider about taking care of your catheter and collection bag. °? Do not take baths, swim, or use a hot tub until your health care provider approves. °· Drink enough fluid to keep your urine  clear or pale yellow. °· Keep all follow-up visits as told by your health care provider. This is important. °Contact a health care provider if: °· You have pain that gets worse or does not get better with medicine, especially pain when you urinate. °· You have difficulty urinating. °· You feel nauseous or you vomit repeatedly during a period of more than 2 days after the procedure. °Get help right away if: °· Your urine is dark red or has blood clots in it. °· You are leaking urine (have incontinence). °· The end of the stent comes out of your urethra. °· You cannot urinate. °· You have sudden, sharp, or severe pain in your abdomen or lower back. °· You have a fever. °This information is not intended to replace advice given to you by your health care provider. Make sure you discuss any questions you have with your health care provider. °Document Released: 09/16/2012 Document Revised: 06/22/2015 Document Reviewed: 07/29/2014 °Elsevier Interactive Patient Education © 2019 Elsevier Inc. ° °

## 2018-04-27 IMAGING — RF DG ESOPHAGUS
7 of 10 series · 14 of 22 positions shown · non-contrast
Comparison: None.

CLINICAL DATA: Vomiting after eating, reflux, dysphagia with solids
for 2 months

EXAM:
ESOPHOGRAM / BARIUM SWALLOW / BARIUM TABLET STUDY
TECHNIQUE: Combined double contrast and single contrast examination performed
using effervescent crystals, thick barium liquid, and thin barium
liquid. The patient was observed with fluoroscopy swallowing a 13 mm
barium sulphate tablet.
FLUOROSCOPY TIME:  Fluoroscopy Time:  1 minute 6 seconds
Radiation Exposure Index (if provided by the fluoroscopic device):
3.2 mGy
Number of Acquired Spot Images: 0

[Series 1: cp_standard · 0.53mm/px · 3 of 50 frames shown (1 of 7)]
[frame 6/50]
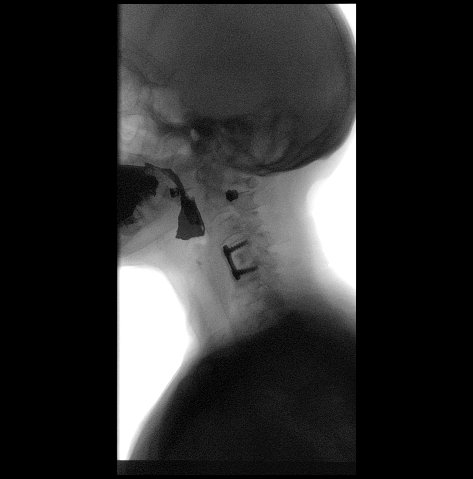
[frame 26/50]
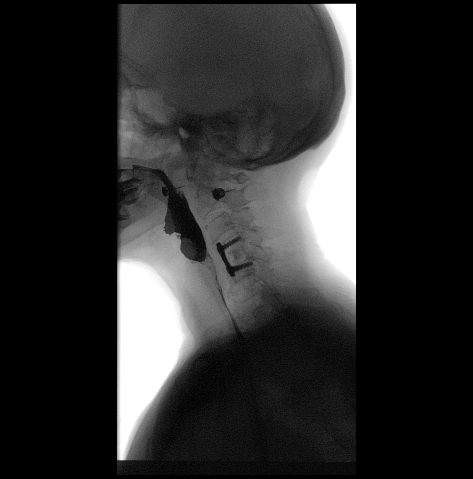
[frame 43/50]
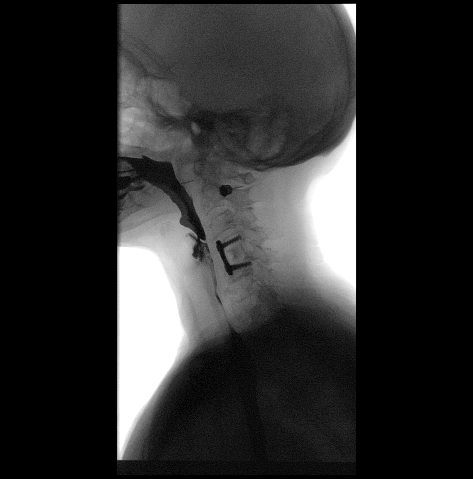

[Series 2: cp_standard · 0.53mm/px · 2 of 99 frames shown (2 of 7)]
[frame 21/99]
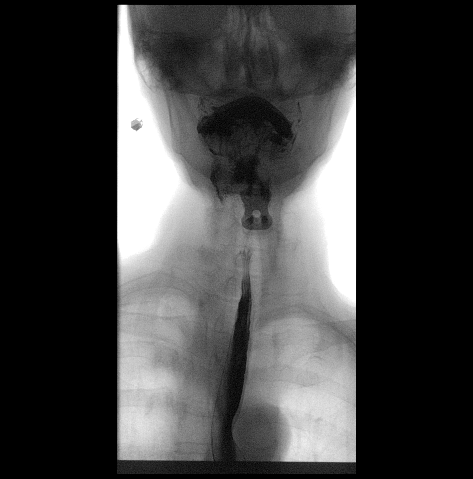
[frame 85/99]
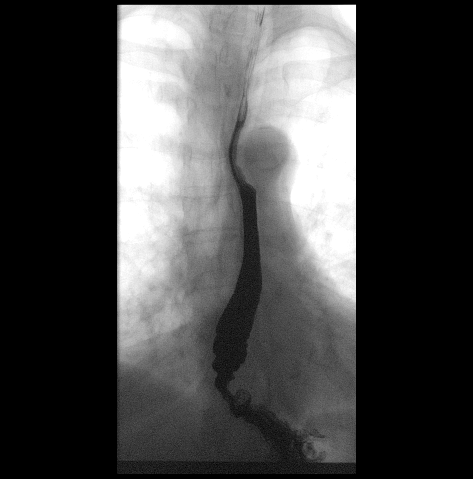

[Series 3: cp_standard · 0.54mm/px · 3 of 54 frames shown (3 of 7)]
[frame 9/54]
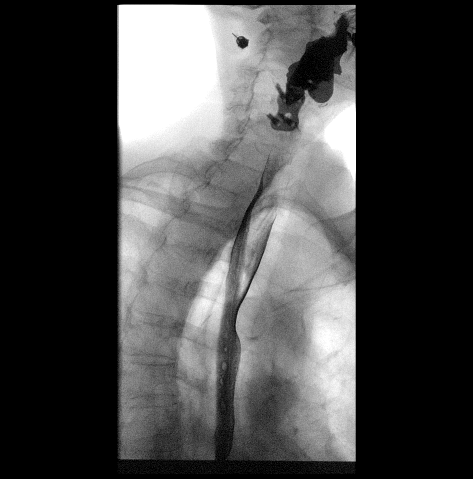
[frame 28/54]
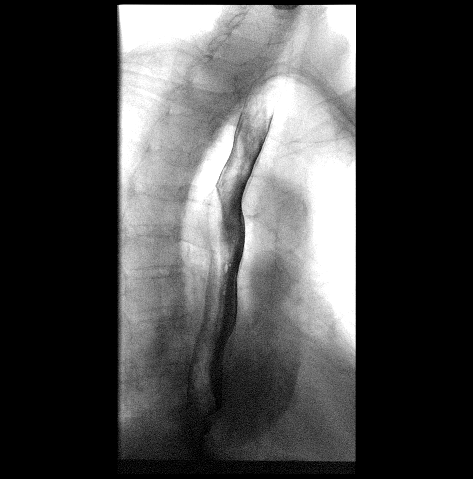
[frame 46/54]
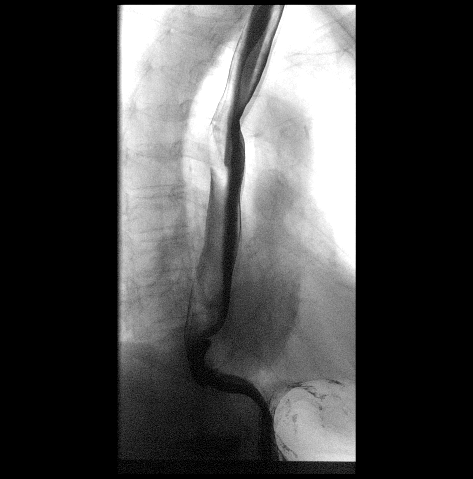

[Series 5: cp_standard · 0.27mm/px · 1 of 1 slices shown (4 of 7)]
[im 1/1]
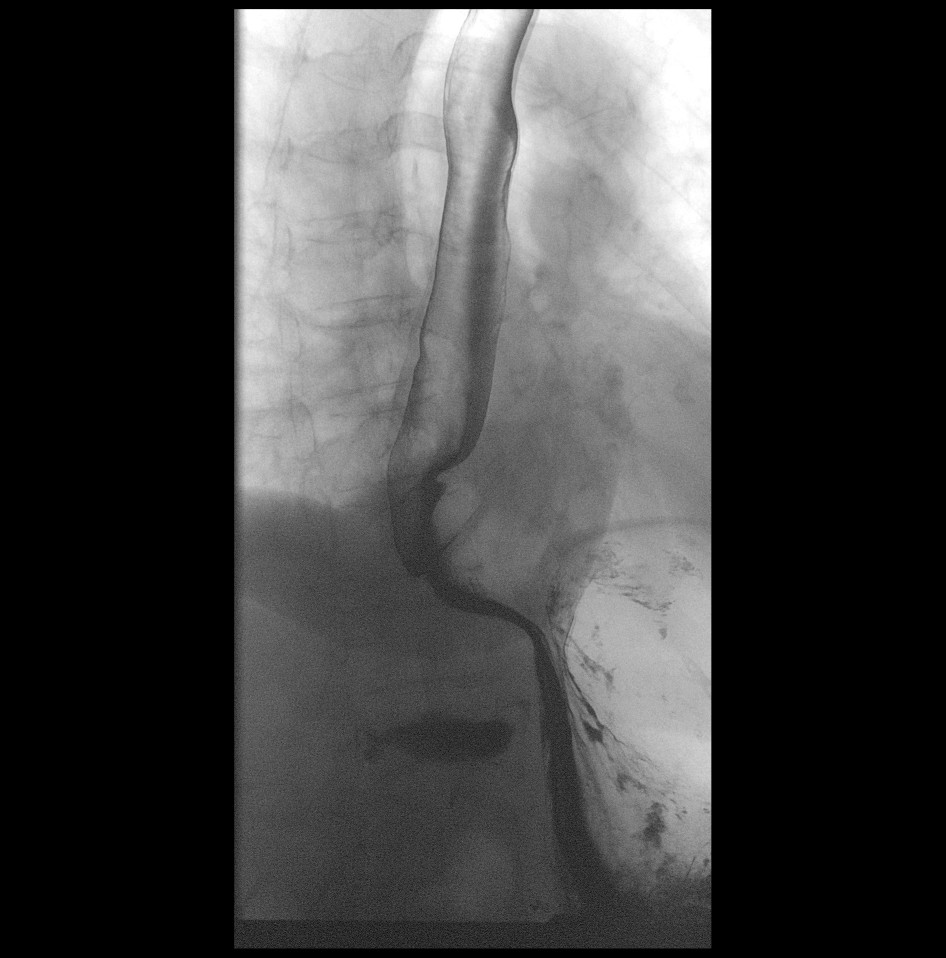

[Series 6: cp_standard · 0.27mm/px · 1 of 1 slices shown (5 of 7)]
[im 1/1]
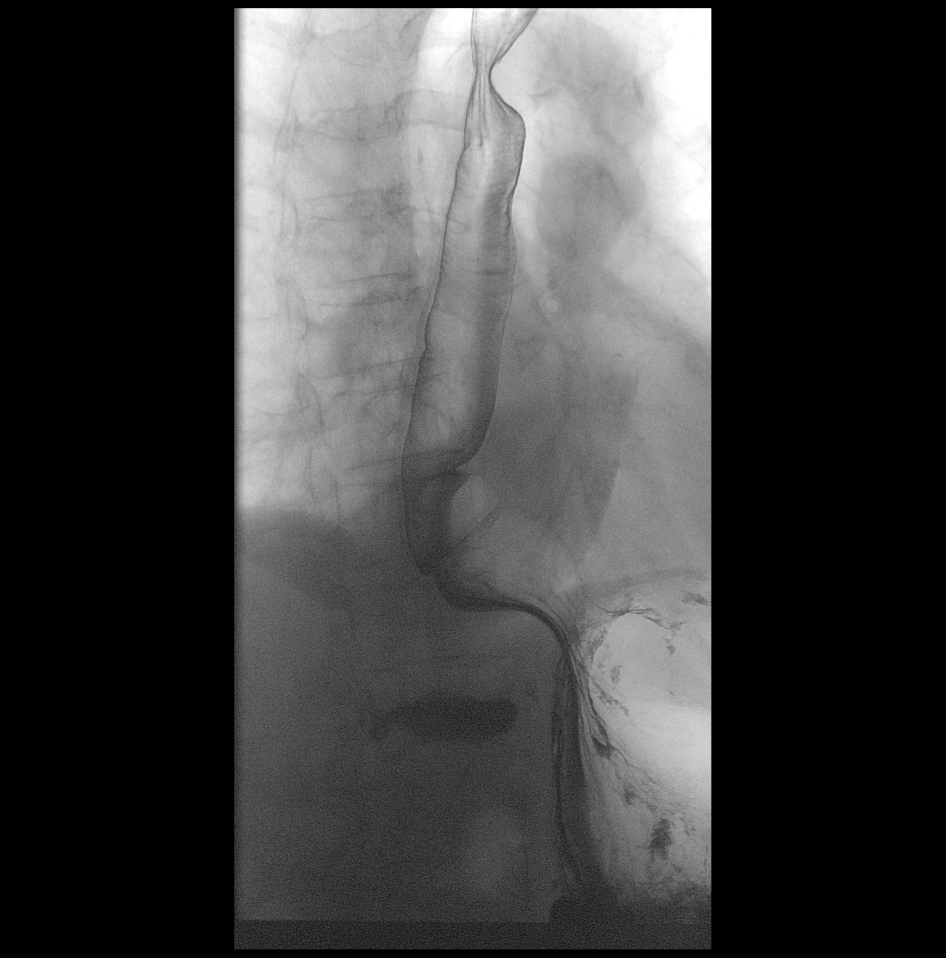

[Series 8: cp_standard · 0.56mm/px · 3 of 47 frames shown (6 of 7)]
[frame 8/47]
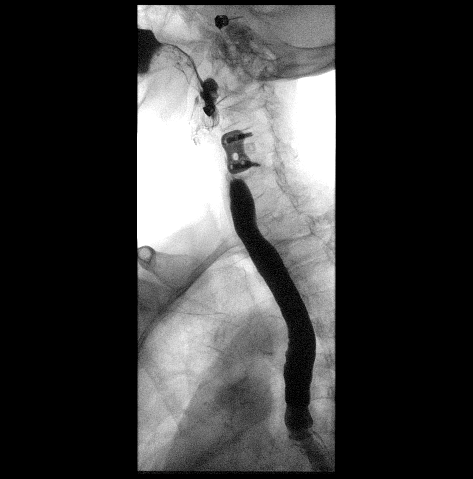
[frame 24/47]
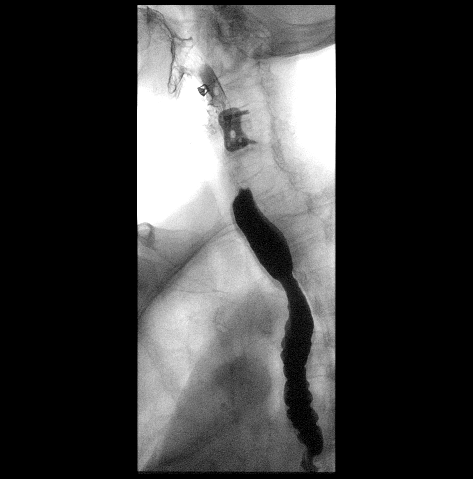
[frame 40/47]
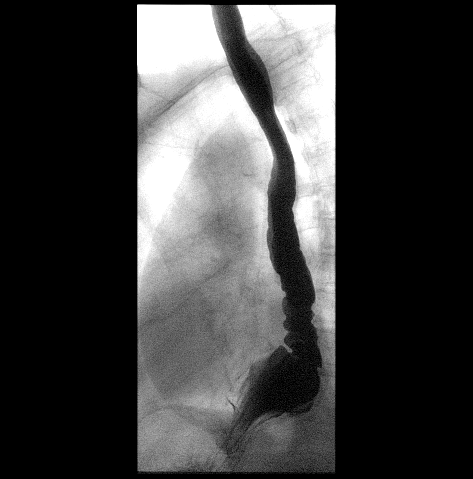

[Series 10: cp_standard · 0.28mm/px · 1 of 1 slices shown (7 of 7)]
[im 1/1]
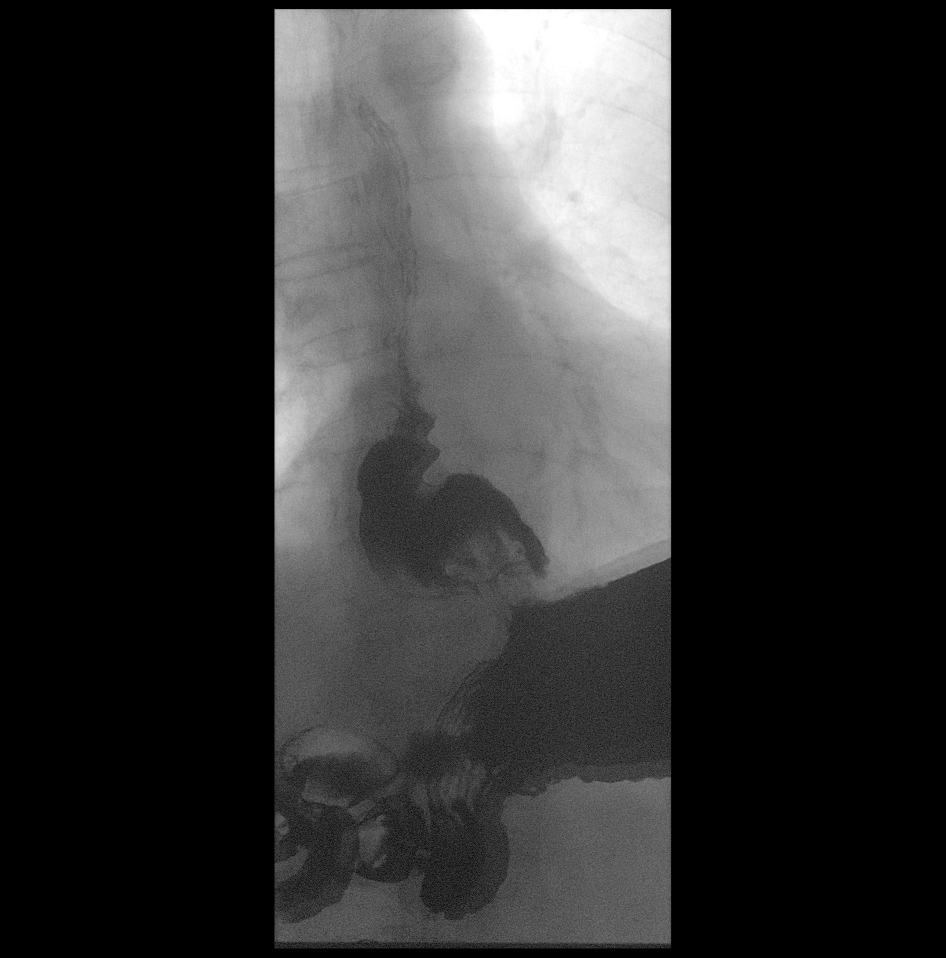

[14 of 22 positions shown; findings below may reference images not displayed]

FINDINGS: Evidence of anterior cervical fusion at C4-5 without hardware
failure or complication.

There was normal pharyngeal anatomy and motility. Contrast flowed
freely through the esophagus without evidence of stricture or mass.
There was normal esophageal mucosa without evidence of irregularity
or ulceration. Tertiary contractions of the distal third of the
esophagus intermittently as can be seen with spasm. Moderate size
hiatal hernia. Mild gastroesophageal reflux.

At the end of the examination a 13 mm barium tablet was administered
which transited through the esophagus and esophagogastric junction
without delay.
IMPRESSION: 1. Moderate size hiatal hernia.  Mild gastroesophageal reflux.
2. Tertiary contractions of the distal third of the esophagus
intermittently as can be seen with spasm.

## 2018-06-02 ENCOUNTER — Ambulatory Visit: Payer: Medicare Other

## 2018-06-03 ENCOUNTER — Other Ambulatory Visit: Payer: Medicare Other

## 2018-06-03 ENCOUNTER — Telehealth: Payer: Self-pay | Admitting: Urology

## 2018-06-03 ENCOUNTER — Other Ambulatory Visit: Payer: Self-pay

## 2018-06-03 DIAGNOSIS — N1339 Other hydronephrosis: Secondary | ICD-10-CM

## 2018-06-03 DIAGNOSIS — N390 Urinary tract infection, site not specified: Secondary | ICD-10-CM

## 2018-06-03 LAB — MICROSCOPIC EXAMINATION: WBC, UA: >30 /hpf — AB (ref 0–5)

## 2018-06-03 LAB — URINALYSIS, COMPLETE
Bilirubin, UA: NEGATIVE
Glucose, UA: NEGATIVE
Ketones, UA: NEGATIVE
Nitrite, UA: POSITIVE — AB
Specific Gravity, UA: 1.02 (ref 1.005–1.030)
Urobilinogen, Ur: 0.2 mg/dL (ref 0.2–1.0)
pH, UA: 6 (ref 5.0–7.5)

## 2018-06-03 MED ORDER — CIPROFLOXACIN HCL 500 MG PO TABS: 500.0000 mg | ORAL_TABLET | Freq: Two times a day (BID) | ORAL | 0 refills | Status: DC

## 2018-06-03 NOTE — Telephone Encounter (Signed)
She has been hospitalized for severe UTI/sepsis previously. She has had resistant UTIs previously, so we should send a urinalysis and culture if at all possible. I know she has a cipro reaction of itching, but it is the only oral option for her prior pseudomonas.   Start Cipro 500mg  BID x 5 days. OK to take benadryl with the cipro if itching.   Needs to go to ED if fever over 101 or not improving.  Nickolas Madrid, MD 06/03/2018

## 2018-06-03 NOTE — Telephone Encounter (Addendum)
Pt.'s daughter called and stated her mom may have a UTI and she is willing to bring in a ucx for her mom but due to her age she would rather not bring her in to the office at this time. Daughter states symptoms include lower abdominal pain and painful urination.Daughter  assures me if her mom states she has a UTI then she does.

## 2018-06-03 NOTE — Telephone Encounter (Signed)
Spoke with patient's daughter and she is in agreement with this plan , she will bring in a urine this afternoon for cx prior to starting abx

## 2018-06-05 LAB — CULTURE, URINE COMPREHENSIVE

## 2018-06-08 ENCOUNTER — Telehealth: Payer: Self-pay | Admitting: Urology

## 2018-06-08 ENCOUNTER — Telehealth: Payer: Self-pay

## 2018-06-08 NOTE — Telephone Encounter (Signed)
-----   Message from Billey Co, MD sent at 06/07/2018 10:35 AM EDT ----- Her urine grew out E Coli sensitive to all abx, please call to make sure she is improving since starting Cipro 5/6.   Thanks Nickolas Madrid, MD 06/07/2018

## 2018-06-08 NOTE — Telephone Encounter (Signed)
Patient's daughter called stating that the patient is having a lot of pain from her kidney. She said she believes it is from the stent? I saw in the chart that she is supposed to have another surgery in July? She wants to bring her in but not sure if that is needed? Please advise if she needs an app or what needs to be done.  Thanks, Sharyn Lull

## 2018-06-08 NOTE — Telephone Encounter (Signed)
Called pt she states that she has 1 pill left but is still having significant back pain that she believes is urinary related. Please advise

## 2018-06-09 ENCOUNTER — Ambulatory Visit
Admission: RE | Admit: 2018-06-09 | Discharge: 2018-06-09 | Disposition: A | Discharge status: Home / Self Care | Payer: Medicare Other | Source: Ambulatory Visit | Attending: Urology | Admitting: Urology

## 2018-06-09 ENCOUNTER — Other Ambulatory Visit: Payer: Self-pay

## 2018-06-09 ENCOUNTER — Other Ambulatory Visit: Payer: Self-pay | Admitting: Urology

## 2018-06-09 DIAGNOSIS — Z960 Presence of urogenital implants: Secondary | ICD-10-CM | POA: Diagnosis not present

## 2018-06-09 DIAGNOSIS — N135 Crossing vessel and stricture of ureter without hydronephrosis: Secondary | ICD-10-CM | POA: Diagnosis not present

## 2018-06-09 DIAGNOSIS — Z466 Encounter for fitting and adjustment of urinary device: Secondary | ICD-10-CM | POA: Diagnosis not present

## 2018-06-10 NOTE — Telephone Encounter (Signed)
-----   Message from Billey Co, MD sent at 06/10/2018  4:19 PM EDT ----- Doristine Devoid news, her ultrasound shows her left kidney is draining normal and there is no back up of urine. Stent is in the right spot. Her left sided back pain may just be a musculoskeletal and not related to the stent. Ok to take ibuprofen or tylenol for pain, rest, and try ice or heat. Keep scheduled follow up in July, and let us know if pain not improving after a few weeks.  Nickolas Madrid, MD 06/10/2018

## 2018-06-10 NOTE — Telephone Encounter (Signed)
Called pt informed her of the information below. Pt gave verbal understanding.  

## 2018-06-15 ENCOUNTER — Other Ambulatory Visit: Payer: Self-pay

## 2018-06-15 ENCOUNTER — Ambulatory Visit (INDEPENDENT_AMBULATORY_CARE_PROVIDER_SITE_OTHER): Payer: Medicare Other

## 2018-06-15 DIAGNOSIS — Z0000 Encounter for general adult medical examination without abnormal findings: Secondary | ICD-10-CM | POA: Diagnosis not present

## 2018-06-15 DIAGNOSIS — Z13820 Encounter for screening for osteoporosis: Secondary | ICD-10-CM | POA: Diagnosis not present

## 2018-06-15 NOTE — Patient Instructions (Signed)
[name] , Thank you for taking time to come for your Medicare Wellness Visit. I appreciate your ongoing commitment to your health goals. Please review the following plan we discussed and let me know if I can assist you in the future.   Screening recommendations/referrals: Colonoscopy: No longer required.  Mammogram: No longer required.  Bone Density: Ordered today. Pt provided with contact info and advised to call to schedule appt. Pt aware the office will call re: appt. Recommended yearly ophthalmology/optometry visit for glaucoma screening and checkup Recommended yearly dental visit for hygiene and checkup  Vaccinations: Influenza vaccine: Up to date Pneumococcal vaccine: Completed series Tdap vaccine: Up to date, due 09/2020 Shingles vaccine: Pt declines today.     Advanced directives: Currently on file.   Conditions/risks identified: Fall risk prevention discussed. Recommend to continue to increase water intake to 6-8 8 oz glasses a day.   Next appointment: 08/31/18 @ 2:00 PM with Dr Caryn Section. Declined scheduling an AWV for 2021.    Preventive Care 83 Years and Older, Female Preventive care refers to lifestyle choices and visits with your health care provider that can promote health and wellness. What does preventive care include?  A yearly physical exam. This is also called an annual well check.  Dental exams once or twice a year.  Routine eye exams. Ask your health care provider how often you should have your eyes checked.  Personal lifestyle choices, including:  Daily care of your teeth and gums.  Regular physical activity.  Eating a healthy diet.  Avoiding tobacco and drug use.  Limiting alcohol use.  Practicing safe sex.  Taking low-dose aspirin every day.  Taking vitamin and mineral supplements as recommended by your health care provider. What happens during an annual well check? The services and screenings done by your health care provider during your  annual well check will depend on your age, overall health, lifestyle risk factors, and family history of disease. Counseling  Your health care provider may ask you questions about your:  Alcohol use.  Tobacco use.  Drug use.  Emotional well-being.  Home and relationship well-being.  Sexual activity.  Eating habits.  History of falls.  Memory and ability to understand (cognition).  Work and work Statistician.  Reproductive health. Screening  You may have the following tests or measurements:  Height, weight, and BMI.  Blood pressure.  Lipid and cholesterol levels. These may be checked every 5 years, or more frequently if you are over 55 years old.  Skin check.  Lung cancer screening. You may have this screening every year starting at age 25 if you have a 30-pack-year history of smoking and currently smoke or have quit within the past 15 years.  Fecal occult blood test (FOBT) of the stool. You may have this test every year starting at age 90.  Flexible sigmoidoscopy or colonoscopy. You may have a sigmoidoscopy every 5 years or a colonoscopy every 10 years starting at age 59.  Hepatitis C blood test.  Hepatitis B blood test.  Sexually transmitted disease (STD) testing.  Diabetes screening. This is done by checking your blood sugar (glucose) after you have not eaten for a while (fasting). You may have this done every 1-3 years.  Bone density scan. This is done to screen for osteoporosis. You may have this done starting at age 48.  Mammogram. This may be done every 1-2 years. Talk to your health care provider about how often you should have regular mammograms. Talk with your health  care provider about your test results, treatment options, and if necessary, the need for more tests. Vaccines  Your health care provider may recommend certain vaccines, such as:  Influenza vaccine. This is recommended every year.  Tetanus, diphtheria, and acellular pertussis (Tdap, Td)  vaccine. You may need a Td booster every 10 years.  Zoster vaccine. You may need this after age 34.  Pneumococcal 13-valent conjugate (PCV13) vaccine. One dose is recommended after age 69.  Pneumococcal polysaccharide (PPSV23) vaccine. One dose is recommended after age 12. Talk to your health care provider about which screenings and vaccines you need and how often you need them. This information is not intended to replace advice given to you by your health care provider. Make sure you discuss any questions you have with your health care provider. Document Released: 02/10/2015 Document Revised: 10/04/2015 Document Reviewed: 11/15/2014 Elsevier Interactive Patient Education  2017 [place] Prevention in the Home Falls can cause injuries. They can happen to people of all ages. There are many things you can do to make your home safe and to help prevent falls. What can I do on the outside of my home?  Regularly fix the edges of walkways and driveways and fix any cracks.  Remove anything that might make you trip as you walk through a door, such as a raised step or threshold.  Trim any bushes or trees on the path to your home.  Use bright outdoor lighting.  Clear any walking paths of anything that might make someone trip, such as rocks or tools.  Regularly check to see if handrails are loose or broken. Make sure that both sides of any steps have handrails.  Any raised decks and porches should have guardrails on the edges.  Have any leaves, snow, or ice cleared regularly.  Use sand or salt on walking paths during winter.  Clean up any spills in your garage right away. This includes oil or grease spills. What can I do in the bathroom?  Use night lights.  Install grab bars by the toilet and in the tub and shower. Do not use towel bars as grab bars.  Use non-skid mats or decals in the tub or shower.  If you need to sit down in the shower, use a plastic, non-slip stool.   Keep the floor dry. Clean up any water that spills on the floor as soon as it happens.  Remove soap buildup in the tub or shower regularly.  Attach bath mats securely with double-sided non-slip rug tape.  Do not have throw rugs and other things on the floor that can make you trip. What can I do in the bedroom?  Use night lights.  Make sure that you have a light by your bed that is easy to reach.  Do not use any sheets or blankets that are too big for your bed. They should not hang down onto the floor.  Have a firm chair that has side arms. You can use this for support while you get dressed.  Do not have throw rugs and other things on the floor that can make you trip. What can I do in the kitchen?  Clean up any spills right away.  Avoid walking on wet floors.  Keep items that you use a lot in easy-to-reach places.  If you need to reach something above you, use a strong step stool that has a grab bar.  Keep electrical cords out of the way.  Do not use floor  polish or wax that makes floors slippery. If you must use wax, use non-skid floor wax.  Do not have throw rugs and other things on the floor that can make you trip. What can I do with my stairs?  Do not leave any items on the stairs.  Make sure that there are handrails on both sides of the stairs and use them. Fix handrails that are broken or loose. Make sure that handrails are as long as the stairways.  Check any carpeting to make sure that it is firmly attached to the stairs. Fix any carpet that is loose or worn.  Avoid having throw rugs at the top or bottom of the stairs. If you do have throw rugs, attach them to the floor with carpet tape.  Make sure that you have a light switch at the top of the stairs and the bottom of the stairs. If you do not have them, ask someone to add them for you. What else can I do to help prevent falls?  Wear shoes that:  Do not have high heels.  Have rubber bottoms.  Are  comfortable and fit you well.  Are closed at the toe. Do not wear sandals.  If you use a stepladder:  Make sure that it is fully opened. Do not climb a closed stepladder.  Make sure that both sides of the stepladder are locked into place.  Ask someone to hold it for you, if possible.  Clearly mark and make sure that you can see:  Any grab bars or handrails.  First and last steps.  Where the edge of each step is.  Use tools that help you move around (mobility aids) if they are needed. These include:  Canes.  Walkers.  Scooters.  Crutches.  Turn on the lights when you go into a dark area. Replace any light bulbs as soon as they burn out.  Set up your furniture so you have a clear path. Avoid moving your furniture around.  If any of your floors are uneven, fix them.  If there are any pets around you, be aware of where they are.  Review your medicines with your doctor. Some medicines can make you feel dizzy. This can increase your chance of falling. Ask your doctor what other things that you can do to help prevent falls. This information is not intended to replace advice given to you by your health care provider. Make sure you discuss any questions you have with your health care provider. Document Released: 11/10/2008 Document Revised: 06/22/2015 Document Reviewed: 02/18/2014 Elsevier Interactive Patient Education  2017 [employer].

## 2018-06-15 NOTE — Progress Notes (Signed)
Subjective:   [place] Monica Singh is a 85 y.o. female who presents for Medicare Annual (Subsequent) preventive examination.    This visit is being conducted through telemedicine due to the COVID-19 pandemic. This patient has given me verbal consent via doximity to conduct this visit, patient states they are participating from their home address. Some vital signs may be absent or patient reported.    Patient identification: identified by name, DOB, and current address  Review of Systems:  N/A  Cardiac Risk Factors include: advanced age (>17men, >63 women)     Objective:     Vitals: There were no vitals taken for this visit.  There is no height or weight on file to calculate BMI. Unable to obtain vitals due to visit being conducted via telephonically.    Advanced Directives 06/15/2018 03/25/2018 03/25/2018 02/27/2018 02/10/2018 02/10/2018 01/30/2018  Does Patient Have a Medical Advance Directive? Yes Yes Yes Yes Yes Yes Yes  Type of Paramedic of [place];Living will Healthcare Power of [place];Living will - Living will;Healthcare Power of Attorney Living will [place];Living will  Does patient want to make changes to medical advance directive? - - - No - Patient declined No - Patient declined - -  Copy of [place] in Chart? Yes - validated most recent copy scanned in chart (See row information) Yes - validated most recent copy scanned in chart (See row information) No - copy requested No - copy requested No - copy requested - -  Would patient like information on creating a medical advance directive? - - - - - - -    Tobacco Social History   Tobacco Use  Smoking Status Former Smoker  . Packs/day: 0.25  . Types: Cigarettes  . Last attempt to quit: 06/21/1989  . Years since quitting: 29.0  Smokeless Tobacco Former Systems developer  . Quit date: 01/28/1989     Counseling given: Not Answered   Clinical  Intake:  Pre-visit preparation completed: Yes  Pain : 0-10 Pain Score: 2  Pain Type: Acute pain Pain Location: Back Pain Orientation: Mid Pain Descriptors / Indicators: Aching, Dull Pain Frequency: Intermittent     Nutritional Status: BMI 25 -29 Overweight Nutritional Risks: None Diabetes: No  How often do you need to have someone help you when you read instructions, pamphlets, or other written materials from your doctor or pharmacy?: 1 - Never  Interpreter Needed?: No  Information entered by :: [facility], LPN  Past Medical History:  Diagnosis Date  . Adnexal mass 06/03/2014   since 2009  . Arthritis   . Complication of anesthesia   . 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 Dyspnea    with exertion  . GERD (gastroesophageal reflux disease)   . Herpes zoster without complication 0/08/6576  . History of hiatal hernia   . History of kidney stones   . HOH (hard of hearing)    wears hearing aides  . Non Hodgkin's lymphoma ([place])   . PONV (postoperative nausea and vomiting)   . Recurrent UTI    Past Surgical History:  Procedure Laterality Date  . ABDOMINAL SURGERY  2009   abdominal mass+ [place] lymphoma,  . BACK SURGERY  01/2017   fusion. metal plate in neck at back  . CATARACT EXTRACTION  1997   right eye and left ey  . cervical neck fusion  1995  . CYSTOSCOPY W/ RETROGRADES Left 01/30/2018   Procedure: CYSTOSCOPY WITH RETROGRADE PYELOGRAM;  Surgeon: Billey Co, MD;  Location: [facility] ORS;  Service: Urology;  Laterality: Left;  . CYSTOSCOPY WITH BIOPSY Left 02/27/2018   Procedure: CYSTOSCOPY WITH BIOPSY;  Surgeon: Billey Co, MD;  Location: [facility] OR[facility];  Service: Urology;  Laterality: Left;  . CYSTOSCOPY WITH STENT PLACEMENT Left 01/30/2018   Procedure: CYSTOSCOPY WITH STENT PLACEMENT;  Surgeon: Billey Co, MD;  Location: [facility] OR[facility];  Service: Urology;  Laterality: Left;  .  CYSTOSCOPY WITH URETEROSCOPY AND STENT PLACEMENT Left 02/27/2018   Procedure: CYSTOSCOPY WITH URETEROSCOPY AND STENT Exchange;  Surgeon: Billey Co, MD;  Location: [facility] OR[facility];  Service: Urology;  Laterality: Left;  . DENTAL SURGERY     screws  . KYPHOPLASTY N/A 02/21/2017   Procedure: [id number];  Surgeon: Hessie Knows, MD;  Location: [facility] OR[facility];  Service: Orthopedics;  Laterality: N/A;  . laparotomy with biopsy  03/02/2007  . PORTACATH PLACEMENT  2009  . [place]  . SQUAMOUS CELL CARCINOMA EXCISION     right arm  . TOTAL KNEE ARTHROPLASTY Left    2009  . URETEROSCOPY Left 01/30/2018   Procedure: URETEROSCOPY;  Surgeon: Billey Co, MD;  Location: [facility] OR[facility];  Service: Urology;  Laterality: Left;  Marland Kitchen VAGINAL HYSTERECTOMY  1971   Family History  Problem Relation Age of Onset  . Breast cancer Sister   . Dementia Sister   . Cataracts Sister   . Heart attack Brother   . CAD Brother   . Heart attack Brother   . Leukemia Grandchild        granddaughter  . Kidney disease Neg Hx   . Bladder Cancer Neg Hx    Social History   Socioeconomic History  . Marital status: Widowed    Spouse name: Not on file  . Number of children: 5  . Years of education: Not on file  . Highest education level: GED or equivalent  Occupational History  . Occupation: retired    Comment: homemaker  Social Needs  . Financial resource strain: Not hard at all  . Food insecurity:    Worry: Never true    Inability: Never true  . Transportation needs:    Medical: No    Non-medical: No  Tobacco Use  . Smoking status: Former Smoker    Packs/day: 0.25    Types: Cigarettes    Last attempt to quit: 06/21/1989    Years since quitting: 29.0  . Smokeless tobacco: Former Systems developer    Quit date: 01/28/1989  Substance and Sexual Activity  . Alcohol use: Yes    Alcohol/week: 14.0 standard drinks    Types: 14 Glasses of wine per week  . Drug use: No  . Sexual activity: Not Currently  Lifestyle  .  Physical activity:    Days per week: 0 days    Minutes per session: 0 min  . Stress: Not at all  Relationships  . Social connections:    Talks on phone: More than three times a week    Gets together: More than three times a week    Attends religious service: More than 4 times per year    Active member of club or organization: Yes    Attends meetings of clubs or organizations: More than 4 times per year    Relationship status: Widowed  Other Topics Concern  . Not on file  Social History Narrative   Ambulates independently,  lives at home by herself    Outpatient Encounter Medications as of 06/15/2018  Medication Sig  . COMBIVENT RESPIMAT 20-100 MCG/ACT AERS respimat INHALE 2 PUFFS INTO THE LUNGS 3 TIMES DAILY (Patient taking differently: Inhale 1 puff into the lungs daily. As needed)  . omeprazole (PRILOSEC) 20 MG capsule Take 20 mg by mouth daily.   . risedronate (ACTONEL) 35 MG tablet TAKE 1 TABLET BY MOUTH EVERY SUNDAY AS DIRECTED  . Apoaequorin (PREVAGEN PO) Take 1 tablet by mouth daily.  . ciprofloxacin (CIPRO) 500 MG tablet Take 1 tablet (500 mg total) by mouth every 12 (twelve) hours. (Patient not taking: Reported on 06/15/2018)   No facility-administered encounter medications on file as of 06/15/2018.     Activities of Daily Living In your present state of health, do you have any difficulty performing the following activities: 06/15/2018 02/10/2018  Hearing? Tempie Donning  Comment Wears bilateral hearing aids.  bilateral hearing aids  Vision? N N  Comment Wears eye glasses daily.  -  Difficulty concentrating or making decisions? Y N  Walking or climbing stairs? N N  Comment - slow  Dressing or bathing? N N  Doing errands, shopping? N Y  Conservation officer, nature and eating ? N -  Using the Toilet? N -  In the past six months, have you accidently leaked urine? Y -  Comment Occasionally, wears protection daily.  -  Do you have problems with loss of bowel control? N -  Managing your  Medications? N -  Managing your Finances? N -  Housekeeping or managing your Housekeeping? N -  Some recent data might be hidden    Patient Care Team: Birdie Sons, MD as PCP - General (Family Medicine) Dingeldein, Remo Lipps, MD as Consulting Physician (Ophthalmology) Billey Co, MD as Consulting Physician (Urology)    Assessment:   This is a routine wellness examination for [place].  Exercise Activities and Dietary recommendations Current Exercise Habits: The patient does not participate in regular exercise at present, Exercise limited by: psychological condition(s)  Goals    . DIET - INCREASE WATER INTAKE     Recommend increasing water intake to 4-6 glasses a day.    . Increase water intake     Recommend increasing water intake to 4 glasses daily.        Fall Risk: Fall Risk  06/15/2018 05/28/2017 06/07/2016 11/20/2015 10/12/2014  Falls in the past year? 1 Yes No No No  Number falls in past yr: 1 1 - - -  Injury with Fall? - Yes - - -  Comment - broke vertabrae - - -  Follow up Falls prevention discussed Falls prevention discussed - - -    FALL RISK PREVENTION PERTAINING TO THE HOME:  Any stairs in or around the home? No  If so, are there any without handrails? N/A  Home free of loose throw rugs in walkways, pet beds, electrical cords, etc? Yes  Adequate lighting in your home to reduce risk of falls? Yes   ASSISTIVE DEVICES UTILIZED TO PREVENT FALLS:  Life alert? Yes  Use of a cane, walker or w/c? Yes  Grab bars in the bathroom? Yes  Shower chair or bench in shower? No  Elevated toilet seat or a handicapped toilet? No    TIMED UP AND GO:  Was the test performed? No .    Depression Screen PHQ 2/9 Scores 06/15/2018 06/15/2018 05/28/2017 06/07/2016  PHQ - 2 Score 0 0 0 0  PHQ- 9 Score 3 - -  0     Cognitive Function     6CIT Screen 06/15/2018 05/28/2017 06/07/2016  What Year? 0 points 0 points 0 points  What month? 0 points 0 points 0 points  What time?  0 points 0 points 0 points  Count back from 20 0 points 0 points 0 points  Months in reverse 0 points 0 points 0 points  Repeat phrase 0 points 4 points 2 points  Total Score 0 4 2    Immunization History  Administered Date(s) Administered  . Influenza Split 12/07/2008, 10/17/2010  . Influenza, High Dose Seasonal PF 10/19/2013, 10/12/2014, 11/20/2015, 11/12/2016, 11/21/2017  . Influenza,inj,Quad PF,6+ Mos 11/04/2012  . Influenza-Unspecified 09/28/2013  . Pneumococcal Conjugate-13 12/21/2012  . Pneumococcal Polysaccharide-23 12/02/1997  . Tdap 10/17/2010  . Zoster 10/14/2008    Qualifies for Shingles Vaccine? Yes  Zostavax completed 10/14/08. Due for Shingrix. Education has been provided regarding the importance of this vaccine. Pt has been advised to call insurance company to determine out of pocket expense. Advised may also receive vaccine at local pharmacy or Health Dept. Verbalized acceptance and understanding.  Tdap: Up to date  Flu Vaccine: Up to date  Pneumococcal Vaccine: Up to date  Screening Tests Health Maintenance  Topic Date Due  . DEXA SCAN  02/29/2016  . INFLUENZA VACCINE  08/29/2018  . TETANUS/TDAP  10/16/2020  . PNA vac Low Risk Adult  Completed    Cancer Screenings:  Colorectal Screening: No longer required.   Mammogram: No longer required.   Bone Density: Completed 02/28/14. Results reflect OSTEOPOROSIS. Repeat every 2 years. Ordered today. Pt provided with contact info and advised to call to schedule appt. Pt aware the office will call re: appt.  Lung Cancer Screening: (Low Dose CT Chest recommended if Age 46-80 years, 30 pack-year currently smoking OR have quit w/in 15years.) does not qualify.   Additional Screening:  Vision Screening: Recommended annual ophthalmology exams for early detection of glaucoma and other disorders of the eye.  Dental Screening: Recommended annual dental exams for proper oral hygiene  Community Resource Referral:   CRR required this visit?  No       Plan:  I have personally reviewed and addressed the Medicare Annual Wellness questionnaire and have noted the following in the patient's chart:  A. Medical and social history B. Use of alcohol, tobacco or illicit drugs  C. Current medications and supplements D. Functional ability and status E.  Nutritional status F.  Physical activity G. Advance directives Monica. List of other physicians I.  Hospitalizations, surgeries, and ER visits in previous 12 months J.  [place] such as hearing and vision if needed, cognitive and depression L. Referrals and appointments   In addition, I have reviewed and discussed with patient certain preventive protocols, quality metrics, and best practice recommendations. A written personalized care plan for preventive services as well as general preventive health recommendations were provided to patient. Nurse Health Advisor  Signed,    Ellanore Vanhook [place], [place]  3/79/0240 Nurse Health Advisor   Nurse Notes: None.

## 2018-06-17 DIAGNOSIS — L219 Seborrheic dermatitis, unspecified: Secondary | ICD-10-CM | POA: Diagnosis not present

## 2018-06-17 DIAGNOSIS — L820 Inflamed seborrheic keratosis: Secondary | ICD-10-CM | POA: Diagnosis not present

## 2018-06-17 DIAGNOSIS — D692 Other nonthrombocytopenic purpura: Secondary | ICD-10-CM | POA: Diagnosis not present

## 2018-06-17 DIAGNOSIS — L570 Actinic keratosis: Secondary | ICD-10-CM | POA: Diagnosis not present

## 2018-06-17 DIAGNOSIS — L752 Apocrine miliaria: Secondary | ICD-10-CM | POA: Diagnosis not present

## 2018-06-17 DIAGNOSIS — L821 Other seborrheic keratosis: Secondary | ICD-10-CM | POA: Diagnosis not present

## 2018-07-15 DIAGNOSIS — M1711 Unilateral primary osteoarthritis, right knee: Secondary | ICD-10-CM | POA: Diagnosis not present

## 2018-07-15 DIAGNOSIS — M25561 Pain in right knee: Secondary | ICD-10-CM | POA: Diagnosis not present

## 2018-07-16 ENCOUNTER — Ambulatory Visit: Payer: Medicare Other | Admitting: Urology

## 2018-07-16 ENCOUNTER — Telehealth: Payer: Self-pay | Admitting: Radiology

## 2018-07-16 ENCOUNTER — Other Ambulatory Visit: Payer: Self-pay | Admitting: Radiology

## 2018-07-16 ENCOUNTER — Encounter

## 2018-07-16 ENCOUNTER — Encounter: Payer: Self-pay | Admitting: Urology

## 2018-07-16 ENCOUNTER — Other Ambulatory Visit: Payer: Self-pay

## 2018-07-16 VITALS — BP 150/83 | HR 101 | Ht 62.0 in | Wt 150.0 lb

## 2018-07-16 DIAGNOSIS — N135 Crossing vessel and stricture of ureter without hydronephrosis: Secondary | ICD-10-CM | POA: Diagnosis not present

## 2018-07-16 NOTE — Progress Notes (Signed)
   07/16/2018 4:30 PM   [place] 01-14-1929 [id number]  Reason for visit: Follow up left UPJ obstruction  HPI: I saw Ms. Tisdale back in urology clinic today for follow-up of her left UPJ obstruction managed with chronic stent.  She is a very nice 85 year old female who originally presented in the fall with recurrent urinary tract infections and severe left-sided flank pain, and was ultimately found to have new onset of left idiopathic UPJ obstruction with moderate to severe upstream hydronephrosis.  Diagnostic ureteroscopy in January 2020 did not show any concerning lesions, and cytology was negative.  She underwent placement of a left ureteral stent with resolution of her left-sided pain.  She is only had a single E. coli UTI in the 6 months of having the stent in place.  She does report a few weeks of left-sided flank pain that seem to be worse when she is standing for prolonged period of time.  Renal ultrasound performed 06/09/2018 showed no hydronephrosis.  We had previously discussed options for long-term management including percutaneous nephrostomy tube, chronic stent changes, pyeloplasty, or nephrectomy.  The patient and her family elected for chronic stent changes which I think is very reasonable with her advanced age and frailty.  Will schedule left ureteral stent exchange under [facility] Continue stent changes every 6 to 12 months pending degree of encrustation and symptoms  A total of 15 minutes were spent face-to-face with the patient, greater than 50% was spent in patient education, counseling, and coordination of care regarding UPJ obstruction and treatment options.  Billey Co, [facility] [address] [place], [place] [address] [phone]

## 2018-07-16 NOTE — Telephone Encounter (Signed)
Patient was given the [facility] Information form below as well as the Instructions for Pre-Admission Testing form & a map of [facility]    [place], [address], [place] [address] Telephone: [phone] Fax: [phone]   Thank you for choosing [facility] for your upcoming surgery!  We are always here to assist in your urological needs.  Please read the following information with specific details for your upcoming appointments related to your surgery. Please contact Hunter Pinkard at [phone] Option 3 with any questions.  The Name of Your Surgery: Cystoscopy, left ureteral stent exchange Your Surgery Date: 08/05/2018 Your Surgeon: Nickolas Madrid  Please call Same Day Surgery at [phone] between the hours of 1pm-3pm one day prior to your surgery. They will inform you of the time to arrive at Same Day Surgery which is located on the second floor of the [facility].   Please refer to the attached letter regarding instructions for Pre-Admission Testing. You will receive a call from the [place] office regarding your appointment with them.  The Pre-Admission Testing office is located at [place], on the first floor of the [place] in [place] (office is to the right as you enter through the [employer] of the [facility]). Please have all medications you are currently taking and your insurance card available.  A COVID-19 test will be required prior to surgery and once test is performed you will need to remain in quarantine until the day of surgery. Patient was advised to have nothing to eat or drink after midnight the night prior to surgery except that she may have only water until 2 hours before surgery with nothing to drink within 2 hours of surgery.  The patient states she currently takes no blood thinners.  Patient's questions were answered and she expressed understanding of these instructions.

## 2018-07-20 ENCOUNTER — Other Ambulatory Visit: Payer: Self-pay

## 2018-07-20 ENCOUNTER — Ambulatory Visit: Payer: Medicare Other | Admitting: Urology

## 2018-07-20 DIAGNOSIS — Z01818 Encounter for other preprocedural examination: Secondary | ICD-10-CM

## 2018-07-22 ENCOUNTER — Telehealth: Payer: Self-pay

## 2018-07-22 ENCOUNTER — Ambulatory Visit
Admission: RE | Admit: 2018-07-22 | Discharge: 2018-07-22 | Disposition: A | Discharge status: Home / Self Care | Payer: Medicare Other | Source: Ambulatory Visit | Attending: Family Medicine | Admitting: Family Medicine

## 2018-07-22 ENCOUNTER — Other Ambulatory Visit: Payer: Self-pay

## 2018-07-22 DIAGNOSIS — Z13820 Encounter for screening for osteoporosis: Secondary | ICD-10-CM | POA: Insufficient documentation

## 2018-07-22 DIAGNOSIS — Z780 Asymptomatic menopausal state: Secondary | ICD-10-CM | POA: Diagnosis not present

## 2018-07-22 DIAGNOSIS — M810 Age-related osteoporosis without current pathological fracture: Secondary | ICD-10-CM | POA: Insufficient documentation

## 2018-07-22 NOTE — Telephone Encounter (Signed)
-----   Message from Birdie Sons, MD sent at 07/22/2018  2:47 PM EDT ----- Osteoporosis is getting worse. Please check and make sure patient is taking Actonel (risedronate) consistently every week.

## 2018-07-22 NOTE — Telephone Encounter (Signed)
Spoke with the patient's daughter.  She does not think the patient had been taking this medicine.  She will work on getting her to start back on the medicine.

## 2018-07-22 NOTE — Telephone Encounter (Signed)
LMTCB 07/22/2018  Thanks,   -Mickel Baas

## 2018-07-23 ENCOUNTER — Ambulatory Visit: Payer: Medicare Other | Admitting: Urology

## 2018-07-24 ENCOUNTER — Other Ambulatory Visit: Payer: Medicare Other

## 2018-07-24 ENCOUNTER — Other Ambulatory Visit: Payer: Self-pay

## 2018-07-24 DIAGNOSIS — Z01818 Encounter for other preprocedural examination: Secondary | ICD-10-CM

## 2018-07-24 DIAGNOSIS — N390 Urinary tract infection, site not specified: Secondary | ICD-10-CM | POA: Diagnosis not present

## 2018-07-24 LAB — URINALYSIS, COMPLETE
Bilirubin, UA: NEGATIVE
Glucose, UA: NEGATIVE
Ketones, UA: NEGATIVE
Nitrite, UA: NEGATIVE
Protein,UA: NEGATIVE
Specific Gravity, UA: 1.015 (ref 1.005–1.030)
Urobilinogen, Ur: 1 mg/dL (ref 0.2–1.0)
pH, UA: 7 (ref 5.0–7.5)

## 2018-07-24 LAB — MICROSCOPIC EXAMINATION: WBC, UA: >30 /hpf — AB (ref 0–5)

## 2018-07-27 LAB — CULTURE, URINE COMPREHENSIVE

## 2018-07-30 ENCOUNTER — Ambulatory Visit: Payer: Medicare Other | Admitting: Urology

## 2018-07-31 ENCOUNTER — Other Ambulatory Visit: Payer: Self-pay

## 2018-07-31 ENCOUNTER — Encounter
Admission: RE | Admit: 2018-07-31 | Discharge: 2018-07-31 | Disposition: A | Discharge status: Home / Self Care | Payer: Medicare Other | Source: Ambulatory Visit | Attending: Urology | Admitting: Urology

## 2018-07-31 DIAGNOSIS — Z01812 Encounter for preprocedural laboratory examination: Secondary | ICD-10-CM | POA: Insufficient documentation

## 2018-07-31 DIAGNOSIS — Z1159 Encounter for screening for other viral diseases: Secondary | ICD-10-CM | POA: Insufficient documentation

## 2018-07-31 NOTE — Patient Instructions (Signed)
Your procedure is scheduled on: [facility]. 7/8 Report to Day Surgery. To find out your arrival time please call [phone] between 1PM - 3PM on Tues 7/7.  Remember: Instructions that are not followed completely may result in serious medical risk,  up to and including death, or upon the discretion of your surgeon and anesthesiologist your  surgery may need to be rescheduled.     _X__ 1. Do not eat food after midnight the night before your procedure.                 No gum chewing or hard candies. You may drink clear liquids up to 2 hours                 before you are scheduled to arrive for your surgery- DO not drink clear                 liquids within 2 hours of the start of your surgery.                 Clear Liquids include:  water, apple juice without pulp, clear carbohydrate                 drink such as Clearfast of Gatorade, Black Coffee or Tea (Do not add                 anything to coffee or tea).  __X__2.  On the morning of surgery brush your teeth with toothpaste and water, you                may rinse your mouth with mouthwash if you wish.  Do not swallow any toothpaste of mouthwash.     _X__ 3.  No Alcohol for 24 hours before or after surgery.   ___ 4.  Do Not Smoke or use e-cigarettes For 24 Hours Prior to Your Surgery.                 Do not use any chewable tobacco products for at least 6 hours prior to                 surgery.  ____  5.  Bring all medications with you on the day of surgery if instructed.   _x___  6.  Notify your doctor if there is any change in your medical condition      (cold, fever, infections).     Do not wear jewelry, make-up, hairpins, clips or nail polish. Do not wear lotions, powders, or perfumes. You may wear deodorant. Do not shave 48 hours prior to surgery. Men may shave face and neck. Do not bring valuables to the hospital.    [facility] is not responsible for any belongings or valuables.  Contacts, dentures or  bridgework may not be worn into surgery. Leave your suitcase in the car. After surgery it may be brought to your room. For patients admitted to the hospital, discharge time is determined by your treatment team.   Patients discharged the day of surgery will not be allowed to drive home.   Please read over the following fact sheets that you were given:    __x__ Take these medicines the morning of surgery with A SIP OF WATER:    1. omeprazole (PRILOSEC) 20 MG capsule   Take dose the night before and the morning of the surgery  2. tamsulosin (FLOMAX) 0.4 MG CAPS capsule  3.   4.  5.  6.  ____ Fleet Enema (as directed)   ____ Use CHG Soap as directed  _x___ Use inhalers on the day of surgery COMBIVENT RESPIMAT 20-100 MCG/ACT AERS respimat  ____ Stop metformin 2 days prior to surgery    ____ Take 1/2 of usual insulin dose the night before surgery. No insulin the morning          of surgery.   ____ Stop Coumadin/Plavix/aspirin on   __x__ Stop Anti-inflammatories ibuprofen or Aleve or Aspirin    May take tylenol   ____ Stop supplements until after surgery.    ____ Bring C-Pap to the hospital.

## 2018-08-01 LAB — SARS CORONAVIRUS 2 (TAT 6-24 HRS): SARS Coronavirus 2: NEGATIVE

## 2018-08-04 ENCOUNTER — Encounter: Payer: Self-pay | Admitting: Anesthesiology

## 2018-08-04 ENCOUNTER — Other Ambulatory Visit: Payer: Self-pay | Admitting: Family Medicine

## 2018-08-04 MED ORDER — CEFAZOLIN SODIUM-DEXTROSE 2-4 GM/100ML-% IV SOLN: 2.0000 g | INTRAVENOUS | Status: DC

## 2018-08-04 MED ORDER — VANCOMYCIN HCL IN DEXTROSE 1-5 GM/200ML-% IV SOLN
1000.0000 mg | INTRAVENOUS | Status: AC
  Administered 2018-08-05: 1000 mg via INTRAVENOUS

## 2018-08-05 ENCOUNTER — Encounter
Admission: RE | Disposition: A | Discharge status: Home / Self Care | Payer: Self-pay | Source: Home / Self Care | Attending: Urology

## 2018-08-05 ENCOUNTER — Ambulatory Visit: Payer: Medicare Other | Admitting: Anesthesiology

## 2018-08-05 ENCOUNTER — Ambulatory Visit
Admission: RE | Admit: 2018-08-05 | Discharge: 2018-08-05 | Disposition: A | Discharge status: Home / Self Care | Payer: Medicare Other | Attending: Urology | Admitting: Urology

## 2018-08-05 ENCOUNTER — Other Ambulatory Visit: Payer: Self-pay

## 2018-08-05 DIAGNOSIS — Z96652 Presence of left artificial knee joint: Secondary | ICD-10-CM | POA: Diagnosis not present

## 2018-08-05 DIAGNOSIS — Z90710 Acquired absence of both cervix and uterus: Secondary | ICD-10-CM | POA: Diagnosis not present

## 2018-08-05 DIAGNOSIS — K219 Gastro-esophageal reflux disease without esophagitis: Secondary | ICD-10-CM | POA: Insufficient documentation

## 2018-08-05 DIAGNOSIS — J449 Chronic obstructive pulmonary disease, unspecified: Secondary | ICD-10-CM | POA: Diagnosis not present

## 2018-08-05 DIAGNOSIS — Z91011 Allergy to milk products: Secondary | ICD-10-CM | POA: Diagnosis not present

## 2018-08-05 DIAGNOSIS — Z820 Family history of epilepsy and other diseases of the nervous system: Secondary | ICD-10-CM | POA: Diagnosis not present

## 2018-08-05 DIAGNOSIS — E039 Hypothyroidism, unspecified: Secondary | ICD-10-CM | POA: Insufficient documentation

## 2018-08-05 DIAGNOSIS — Z882 Allergy status to sulfonamides status: Secondary | ICD-10-CM | POA: Diagnosis not present

## 2018-08-05 DIAGNOSIS — Z87891 Personal history of nicotine dependence: Secondary | ICD-10-CM | POA: Diagnosis not present

## 2018-08-05 DIAGNOSIS — Z87440 Personal history of urinary (tract) infections: Secondary | ICD-10-CM | POA: Insufficient documentation

## 2018-08-05 DIAGNOSIS — Z8249 Family history of ischemic heart disease and other diseases of the circulatory system: Secondary | ICD-10-CM | POA: Insufficient documentation

## 2018-08-05 DIAGNOSIS — K449 Diaphragmatic hernia without obstruction or gangrene: Secondary | ICD-10-CM | POA: Insufficient documentation

## 2018-08-05 DIAGNOSIS — Z806 Family history of leukemia: Secondary | ICD-10-CM | POA: Diagnosis not present

## 2018-08-05 DIAGNOSIS — Z87442 Personal history of urinary calculi: Secondary | ICD-10-CM | POA: Diagnosis not present

## 2018-08-05 DIAGNOSIS — Z803 Family history of malignant neoplasm of breast: Secondary | ICD-10-CM | POA: Insufficient documentation

## 2018-08-05 DIAGNOSIS — I493 Ventricular premature depolarization: Secondary | ICD-10-CM | POA: Insufficient documentation

## 2018-08-05 DIAGNOSIS — N135 Crossing vessel and stricture of ureter without hydronephrosis: Secondary | ICD-10-CM | POA: Diagnosis not present

## 2018-08-05 DIAGNOSIS — M1990 Unspecified osteoarthritis, unspecified site: Secondary | ICD-10-CM | POA: Diagnosis not present

## 2018-08-05 DIAGNOSIS — Z881 Allergy status to other antibiotic agents status: Secondary | ICD-10-CM | POA: Insufficient documentation

## 2018-08-05 DIAGNOSIS — Z9841 Cataract extraction status, right eye: Secondary | ICD-10-CM | POA: Diagnosis not present

## 2018-08-05 DIAGNOSIS — Z8572 Personal history of non-Hodgkin lymphomas: Secondary | ICD-10-CM | POA: Diagnosis not present

## 2018-08-05 DIAGNOSIS — E02 Subclinical iodine-deficiency hypothyroidism: Secondary | ICD-10-CM | POA: Diagnosis not present

## 2018-08-05 DIAGNOSIS — H9193 Unspecified hearing loss, bilateral: Secondary | ICD-10-CM | POA: Diagnosis not present

## 2018-08-05 DIAGNOSIS — Z9842 Cataract extraction status, left eye: Secondary | ICD-10-CM | POA: Insufficient documentation

## 2018-08-05 DIAGNOSIS — N1330 Unspecified hydronephrosis: Secondary | ICD-10-CM | POA: Diagnosis not present

## 2018-08-05 HISTORY — PX: CYSTOSCOPY W/ URETERAL STENT PLACEMENT: SHX1429

## 2018-08-05 HISTORY — PX: CYSTOSCOPY W/ RETROGRADES: SHX1426

## 2018-08-05 LAB — CBC
HCT: 39.6 % (ref 36.0–46.0)
Hemoglobin: 12.8 g/dL (ref 12.0–15.0)
MCH: 32 pg (ref 26.0–34.0)
MCHC: 32.3 g/dL (ref 30.0–36.0)
MCV: 99 fL (ref 80.0–100.0)
Platelets: 282 K/uL (ref 150–400)
RBC: 4 MIL/uL (ref 3.87–5.11)
RDW: 12.5 % (ref 11.5–15.5)
WBC: 6.8 K/uL (ref 4.0–10.5)
nRBC: 0 % (ref 0.0–0.2)

## 2018-08-05 SURGERY — CYSTOSCOPY, FLEXIBLE, WITH STENT REPLACEMENT
Anesthesia: Monitor Anesthesia Care | Site: Ureter | Laterality: Left

## 2018-08-05 MED ORDER — FENTANYL CITRATE (PF) 100 MCG/2ML IJ SOLN
INTRAMUSCULAR | Status: AC
  Filled 2018-08-05: qty 2

## 2018-08-05 MED ORDER — IOHEXOL 180 MG/ML  SOLN
INTRAMUSCULAR | Status: DC | PRN
  Administered 2018-08-05: 20 mL

## 2018-08-05 MED ORDER — PROPOFOL 10 MG/ML IV BOLUS
INTRAVENOUS | Status: DC | PRN
  Administered 2018-08-05 (×2): 20 mg via INTRAVENOUS

## 2018-08-05 MED ORDER — FENTANYL CITRATE (PF) 100 MCG/2ML IJ SOLN: 25.0000 ug | INTRAMUSCULAR | Status: DC | PRN

## 2018-08-05 MED ORDER — MIDAZOLAM HCL 2 MG/2ML IJ SOLN
INTRAMUSCULAR | Status: DC | PRN
  Administered 2018-08-05 (×2): 1 mg via INTRAVENOUS

## 2018-08-05 MED ORDER — MIDAZOLAM HCL 2 MG/2ML IJ SOLN
INTRAMUSCULAR | Status: AC
  Filled 2018-08-05: qty 2

## 2018-08-05 MED ORDER — ONDANSETRON HCL 4 MG/2ML IJ SOLN: 4.0000 mg | Freq: Once | INTRAMUSCULAR | Status: DC | PRN

## 2018-08-05 MED ORDER — ONDANSETRON HCL 4 MG/2ML IJ SOLN
INTRAMUSCULAR | Status: DC | PRN
  Administered 2018-08-05: 4 mg via INTRAVENOUS

## 2018-08-05 MED ORDER — PIPERACILLIN-TAZOBACTAM 3.375 G IVPB
INTRAVENOUS | Status: AC
  Filled 2018-08-05: qty 50

## 2018-08-05 MED ORDER — FENTANYL CITRATE (PF) 100 MCG/2ML IJ SOLN
INTRAMUSCULAR | Status: DC | PRN
  Administered 2018-08-05: 25 ug via INTRAVENOUS

## 2018-08-05 MED ORDER — PROPOFOL 500 MG/50ML IV EMUL
INTRAVENOUS | Status: DC | PRN
  Administered 2018-08-05: 50 ug/kg/min via INTRAVENOUS

## 2018-08-05 MED ORDER — VANCOMYCIN HCL IN DEXTROSE 1-5 GM/200ML-% IV SOLN
INTRAVENOUS | Status: AC
  Administered 2018-08-05: 1000 mg via INTRAVENOUS
  Filled 2018-08-05: qty 200

## 2018-08-05 MED ORDER — LACTATED RINGERS IV SOLN
INTRAVENOUS | Status: DC
  Administered 2018-08-05: 14:00:00 via INTRAVENOUS

## 2018-08-05 MED ORDER — PIPERACILLIN-TAZOBACTAM 3.375 G IVPB
3.3750 g | Freq: Once | INTRAVENOUS | Status: AC
  Administered 2018-08-05: 3.375 g via INTRAVENOUS

## 2018-08-05 SURGICAL SUPPLY — 24 items
BAG DRAIN CYSTO-URO LG1000N (MISCELLANEOUS) ×3 IMPLANT
BRUSH SCRUB EZ  4% CHG (MISCELLANEOUS)
BRUSH SCRUB EZ 4% CHG (MISCELLANEOUS) IMPLANT
CATH FOL 2WAY LX 16X30 (CATHETERS) ×2 IMPLANT
CATH URETL 5X70 OPEN END (CATHETERS) ×3 IMPLANT
CONRAY 43 FOR UROLOGY 50M (MISCELLANEOUS) ×3 IMPLANT
GLOVE BIOGEL PI IND STRL 7.5 (GLOVE) ×1 IMPLANT
GLOVE BIOGEL PI INDICATOR 7.5 (GLOVE) ×2
GOWN STRL REUS W/ TWL LRG LVL3 (GOWN DISPOSABLE) ×1 IMPLANT
GOWN STRL REUS W/ TWL XL LVL3 (GOWN DISPOSABLE) ×1 IMPLANT
GOWN STRL REUS W/TWL LRG LVL3 (GOWN DISPOSABLE) ×2
GOWN STRL REUS W/TWL XL LVL3 (GOWN DISPOSABLE) ×2
GUIDEWIRE STR DUAL SENSOR (WIRE) ×3 IMPLANT
KIT TURNOVER CYSTO (KITS) ×3 IMPLANT
PACK CYSTO AR (MISCELLANEOUS) ×3 IMPLANT
SET CYSTO W/LG BORE CLAMP LF (SET/KITS/TRAYS/PACK) ×3 IMPLANT
SOL .9 NS 3000ML IRR  AL (IV SOLUTION) ×2
SOL .9 NS 3000ML IRR UROMATIC (IV SOLUTION) ×1 IMPLANT
STENT URET 6FRX24 CONTOUR (STENTS) IMPLANT
STENT URET 6FRX26 CONTOUR (STENTS) IMPLANT
STENT URO INLAY 6FRX24CM (STENTS) ×2 IMPLANT
SURGILUBE 2OZ TUBE FLIPTOP (MISCELLANEOUS) ×3 IMPLANT
SYRINGE IRR TOOMEY STRL 70CC (SYRINGE) IMPLANT
WATER STERILE IRR 1000ML POUR (IV SOLUTION) ×3 IMPLANT

## 2018-08-05 NOTE — Anesthesia Preprocedure Evaluation (Addendum)
Anesthesia Evaluation  Patient identified by MRN, date of birth, ID band Patient awake    Reviewed: Allergy & Precautions, NPO status , Patient's Chart, lab work & pertinent test results, reviewed documented beta blocker date and time   History of Anesthesia Complications (+) PONV and history of anesthetic complications  Airway Mallampati: III  TM Distance: >3 FB     Dental  (+) Chipped, Upper Dentures   Pulmonary shortness of breath, COPD, former smoker,           Cardiovascular      Neuro/Psych    GI/Hepatic hiatal hernia, GERD  Controlled,  Endo/Other  Hypothyroidism   Renal/GU Renal disease     Musculoskeletal  (+) Arthritis ,   Abdominal   Peds  Hematology   Anesthesia Other Findings Used to smoke. Dec hearing. Lymphoma. Neck movement ok. EKG ok. Hx of PVCs.  Reproductive/Obstetrics                            Anesthesia Physical Anesthesia Plan  ASA: III  Anesthesia Plan: MAC   Post-op Pain Management:    Induction: Intravenous  PONV Risk Score and Plan:   Airway Management Planned:   Additional Equipment:   Intra-op Plan:   Post-operative Plan:   Informed Consent: I have reviewed the patients History and Physical, chart, labs and discussed the procedure including the risks, benefits and alternatives for the proposed anesthesia with the patient or authorized representative who has indicated his/her understanding and acceptance.       Plan Discussed with: CRNA  Anesthesia Plan Comments:         Anesthesia Quick Evaluation

## 2018-08-05 NOTE — Op Note (Signed)
Date of procedure: 08/05/18  Preoperative diagnosis:  1. Left UPJ obstruction, chronic  Postoperative diagnosis:  1. Same  Procedure: 1. Cystoscopy, left retrograde pyelogram with intraoperative interpretation, left ureteral stent exchange  Surgeon: Nickolas Madrid, MD  Anesthesia: MAC  Complications: None  Intraoperative findings:  1.  Normal cystoscopy 2.  Left retrograde pyelogram with mild hydronephrosis above left UPJ kinking 3.  Uncomplicated stent exchange   EBL: None  Specimens: None  Drains: Left 6 French by 24 cm Bard Optima stent, 16 French Foley for temporary drainage postop  Indication: [place] H Wecker is a 85 y.o. patient with chronic left UPJ obstruction managed with stent exchanges.  After reviewing the management options for treatment, they elected to proceed with the above surgical procedure(s). We have discussed the potential benefits and risks of the procedure, side effects of the proposed treatment, the likelihood of the patient achieving the goals of the procedure, and any potential problems that might occur during the procedure or recuperation. Informed consent has been obtained.  Description of procedure:  The patient was taken to the operating room and sedation was provided by anesthesia with monitored care. SCDs were placed for DVT prophylaxis. The patient was placed in the dorsal lithotomy position, prepped and draped in the usual sterile fashion, and preoperative antibiotics(vancomycin and Zosyn) were administered. A preoperative time-out was performed.   A 21 French rigid cystoscope was used to intubate the urethra.  Cystoscopy demonstrated no abnormalities.  A sensor wire was advanced alongside the indwelling left ureteral stent up to the collecting system under fluoroscopic vision.  The stent was then grasped and removed.  There was minimal encrustation.  A retrograde pyelogram was performed via a 5 [place] access catheter and demonstrated a  normal-appearing ureter with kinking at the UPJ and mild upstream hydronephrosis.  A 6 French by 24 cm Bard Optima stent was placed under fluoroscopic vision over the wire with an excellent curl in the renal pelvis.  Cystoscopy was performed and demonstrated an excellent curl in the bladder, and contrast was seen to drain briskly through the side ports of the stent.  A 21 French Foley was placed to maximize drainage and minimize infection risk in the postoperative period.  Disposition: Stable to PACU  Plan: Follow-up in 6 months for symptom check, sooner if UTI or left-sided flank pain Tentatively plan for stent exchange under MAC in 9 months  Nickolas Madrid, MD

## 2018-08-05 NOTE — H&P (Signed)
08/05/18 12:35 PM   [place] Oct 05, 1928 [id number]   HPI: She is a very nice 85 year old female who originally presented in the fall 2019 with recurrent urinary tract infections and severe left-sided flank pain, and was ultimately found to have new onset of left idiopathic UPJ obstruction with moderate to severe upstream hydronephrosis.  Diagnostic ureteroscopy in January 2020 did not show any concerning lesions, and cytology was negative.  She underwent placement of a left ureteral stent with resolution of her left-sided pain.  She has only had a single E. coli UTI in the 6 months of having the stent in place.  She does report a few weeks of left-sided flank pain that seem to be worse when she is standing for prolonged period of time.  Renal ultrasound performed 06/09/2018 showed no hydronephrosis.  We had previously discussed options for long-term management including percutaneous nephrostomy tube, chronic stent changes, pyeloplasty, or nephrectomy.  The patient and her family elected for chronic stent changes which I think is very reasonable with her advanced age and frailty.  She denies any fevers, chills, CP, or SOB.   PMH: Past Medical History:  Diagnosis Date  . Adnexal mass 06/03/2014   since 2009  . Arthritis   . Complication of anesthesia   . Diffuse large cell lymphoma in remission ([place]) 01/2007   NON-HODGKINS-stage 3, cd 20 positive; status post 6 cycles of R-CHOP  . Dyspnea    with exertion  . GERD (gastroesophageal reflux disease)   . Herpes zoster without complication 05/31/2704  . History of hiatal hernia   . History of kidney stones   . HOH (hard of hearing)    wears hearing aides  . Non Hodgkin's lymphoma ([place])   . PONV (postoperative nausea and vomiting)    in January lasted about 4 hours  . Recurrent UTI     Surgical History: Past Surgical History:  Procedure Laterality Date  . ABDOMINAL SURGERY  2009   abdominal mass+ [place] lymphoma,  . BACK  SURGERY  01/2017   fusion. metal plate in neck at back  . CATARACT EXTRACTION  1997   right eye and left ey  . cervical neck fusion  1995  . CYSTOSCOPY W/ RETROGRADES Left 01/30/2018   Procedure: CYSTOSCOPY WITH RETROGRADE PYELOGRAM;  Surgeon: Billey Co, MD;  Location: [facility] ORS;  Service: Urology;  Laterality: Left;  . CYSTOSCOPY WITH BIOPSY Left 02/27/2018   Procedure: CYSTOSCOPY WITH BIOPSY;  Surgeon: Billey Co, MD;  Location: [facility] ORS;  Service: Urology;  Laterality: Left;  . CYSTOSCOPY WITH STENT PLACEMENT Left 01/30/2018   Procedure: CYSTOSCOPY WITH STENT PLACEMENT;  Surgeon: Billey Co, MD;  Location: [facility] ORS;  Service: Urology;  Laterality: Left;  . CYSTOSCOPY WITH URETEROSCOPY AND STENT PLACEMENT Left 02/27/2018   Procedure: CYSTOSCOPY WITH URETEROSCOPY AND STENT Exchange;  Surgeon: Billey Co, MD;  Location: [facility] ORS;  Service: Urology;  Laterality: Left;  . DENTAL SURGERY     screws  . JOINT REPLACEMENT Left    knee  . KYPHOPLASTY N/A 02/21/2017   Procedure: [id number];  Surgeon: Hessie Knows, MD;  Location: [facility] ORS;  Service: Orthopedics;  Laterality: N/A;  . laparotomy with biopsy  03/02/2007  . PORTACATH PLACEMENT  2009  . [place]  . SQUAMOUS CELL CARCINOMA EXCISION     right arm  . TOTAL KNEE ARTHROPLASTY Left    2009  . URETEROSCOPY Left 01/30/2018   Procedure: URETEROSCOPY;  Surgeon: Nickolas Madrid  C, MD;  Location: [facility] ORS;  Service: Urology;  Laterality: Left;  Marland Kitchen VAGINAL HYSTERECTOMY  1971     Allergies:  Allergies  Allergen Reactions  . Ciprofloxacin Itching  . Milk-Related Compounds Diarrhea and Nausea And Vomiting    Gas too  . Lac Bovis Diarrhea and Nausea And Vomiting    Milk allergy Milk allergy  . Sulfa Antibiotics Hives and Itching    Family History: Family History  Problem Relation Age of Onset  . Breast cancer Sister   . Dementia Sister   . Cataracts Sister   . Heart attack Brother   . CAD Brother   .  Heart attack Brother   . Leukemia Grandchild        granddaughter  . Kidney disease Neg Hx   . Bladder Cancer Neg Hx     Social History:  reports that she quit smoking about 29 years ago. Her smoking use included cigarettes. She smoked 0.25 packs per day. She has never used smokeless tobacco. She reports current alcohol use of about 14.0 standard drinks of alcohol per week. She reports that she does not use drugs.  ROS: Please see flowsheet from today's date for complete review of systems.  Physical Exam: BP (!) 142/74   Pulse 88   Temp (!) 97.4 F (36.3 C) (Tympanic)   Resp 16   SpO2 100%    Constitutional:  Alert and oriented, No acute distress. Cardiovascular: RRR Respiratory: CTA bilaterally GI: Abdomen is soft, nontender, nondistended, no abdominal masses Lymph: No cervical or inguinal lymphadenopathy. Skin: No rashes, bruises or suspicious lesions. Neurologic: Grossly intact, no focal deficits, moving all 4 extremities. Psychiatric: Normal mood and affect.  Laboratory Data: Urine cx 07/24/18 5k mixed flora Prior cultures and sensitivities reviewed   Assessment & Plan:   85 yo F with symptomatic non-malignant left UPJ obstruction managed with chronic stent changes. Here today for first 6 months exchange, will space to 9-12 months if minimal encrustation today. We discussed alternatives, benefits, and risks of bleeding, infection, pain, and ureteral injury.  Proceed with left ureteral stent change today   Billey Co, MD  [place] [address], [place], [place] [address] [phone]

## 2018-08-05 NOTE — Transfer of Care (Signed)
Immediate Anesthesia Transfer of Care Note  Patient: Laureles  Procedure(s) Performed: CYSTOSCOPY WITH STENT Exchange (Left Ureter) CYSTOSCOPY WITH RETROGRADE PYELOGRAM (Left Ureter)  Patient Location: PACU  Anesthesia Type:General  Level of Consciousness: awake, oriented and patient cooperative  Airway & Oxygen Therapy: Patient Spontanous Breathing  Post-op Assessment: Report given to RN, Post -op Vital signs reviewed and stable and Patient moving all extremities  Post vital signs: Reviewed and stable  Last Vitals:  Vitals Value Taken Time  BP 119/73 08/05/18 1436  Temp 36.4 C 08/05/18 1436  Pulse 83 08/05/18 1437  Resp 22 08/05/18 1437  SpO2 96 % 08/05/18 1437  Vitals shown include unvalidated device data.  Last Pain:  Vitals:   08/05/18 1232  TempSrc: Tympanic  PainSc: 0-No pain         Complications: No apparent anesthesia complications

## 2018-08-05 NOTE — Discharge Instructions (Signed)
AMBULATORY SURGERY  °DISCHARGE INSTRUCTIONS ° ° °1) The drugs that you were given will stay in your system until tomorrow so for the next 24 hours you should not: ° °A) Drive an automobile °B) Make any legal decisions °C) Drink any alcoholic beverage ° ° °2) You may resume regular meals tomorrow.  Today it is better to start with liquids and gradually work up to solid foods. ° °You may eat anything you prefer, but it is better to start with liquids, then soup and crackers, and gradually work up to solid foods. ° ° °3) Please notify your doctor immediately if you have any unusual bleeding, trouble breathing, redness and pain at the surgery site, drainage, fever, or pain not relieved by medication. ° ° ° °4) Additional Instructions: ° ° ° ° ° ° ° °Please contact your physician with any problems or Same Day Surgery at [phone], Monday through Friday 6 am to 4 pm, or [facility] [place] [facility] number at [phone]. °

## 2018-08-05 NOTE — Anesthesia Post-op Follow-up Note (Signed)
Anesthesia QCDR form completed.        

## 2018-08-06 ENCOUNTER — Encounter: Payer: Self-pay | Admitting: Urology

## 2018-08-06 NOTE — Anesthesia Postprocedure Evaluation (Signed)
Anesthesia Post Note  Patient: Monica Singh  Procedure(s) Performed: CYSTOSCOPY WITH STENT Exchange (Left Ureter) CYSTOSCOPY WITH RETROGRADE PYELOGRAM (Left Ureter)  Patient location during evaluation: PACU Anesthesia Type: MAC Level of consciousness: awake and alert Pain management: pain level controlled Vital Signs Assessment: post-procedure vital signs reviewed and stable Respiratory status: spontaneous breathing, nonlabored ventilation, respiratory function stable and patient connected to nasal cannula oxygen Cardiovascular status: stable and blood pressure returned to baseline Postop Assessment: no apparent nausea or vomiting Anesthetic complications: no     Last Vitals:  Vitals:   08/05/18 1506 08/05/18 1518  BP: 128/60 138/61  Pulse: 86 62  Resp: 19 18  Temp: (!) 36.4 C (!) 36.4 C  SpO2: 99% 94%    Last Pain:  Vitals:   08/05/18 1518  TempSrc: Temporal  PainSc: 0-No pain                 Monica Singh S

## 2018-08-15 ENCOUNTER — Other Ambulatory Visit: Payer: Self-pay | Admitting: Family Medicine

## 2018-08-31 ENCOUNTER — Encounter: Payer: Medicare Other | Admitting: Family Medicine

## 2018-09-04 DIAGNOSIS — M1711 Unilateral primary osteoarthritis, right knee: Secondary | ICD-10-CM | POA: Diagnosis not present

## 2018-09-17 ENCOUNTER — Other Ambulatory Visit: Payer: Self-pay

## 2018-09-17 ENCOUNTER — Ambulatory Visit: Payer: Medicare Other | Admitting: Physician Assistant

## 2018-09-17 ENCOUNTER — Encounter: Payer: Self-pay | Admitting: Physician Assistant

## 2018-09-17 VITALS — BP 144/78 | HR 92 | Ht 62.0 in | Wt 147.9 lb

## 2018-09-17 DIAGNOSIS — N390 Urinary tract infection, site not specified: Secondary | ICD-10-CM | POA: Diagnosis not present

## 2018-09-17 DIAGNOSIS — R300 Dysuria: Secondary | ICD-10-CM

## 2018-09-17 DIAGNOSIS — Z87440 Personal history of urinary (tract) infections: Secondary | ICD-10-CM

## 2018-09-17 LAB — MICROSCOPIC EXAMINATION: WBC, UA: >30 /hpf — AB (ref 0–5)

## 2018-09-17 LAB — URINALYSIS, COMPLETE
Bilirubin, UA: NEGATIVE
Glucose, UA: NEGATIVE
Ketones, UA: NEGATIVE
Nitrite, UA: POSITIVE — AB
Specific Gravity, UA: 1.025 (ref 1.005–1.030)
Urobilinogen, Ur: 0.2 mg/dL (ref 0.2–1.0)
pH, UA: 6 (ref 5.0–7.5)

## 2018-09-17 MED ORDER — CIPROFLOXACIN HCL 500 MG PO TABS: 500.0000 mg | ORAL_TABLET | Freq: Two times a day (BID) | ORAL | 0 refills | Status: AC

## 2018-09-17 MED ORDER — NITROFURANTOIN MONOHYD MACRO 100 MG PO CAPS: 100.0000 mg | ORAL_CAPSULE | Freq: Two times a day (BID) | ORAL | 0 refills | Status: AC

## 2018-09-17 NOTE — Progress Notes (Signed)
09/17/2018 3:27 PM   [place] 1928-04-06 [id number]  CC: Dysuria, left flank pain  HPI: Monica Singh is a 85 y.o. female with PMH chronic left UPJ obstruction managed with chronic indwelling stent who presents today for evaluation of possible UTI. She is an established [facility] patient who last saw Dr. Diamantina Providence on 08/05/2018 for cystoscopy with left retrograde pyelogram and stent exchange. There were no complications of the procedure.  Urologic history significant for recurrent UTIs with various organisms and inconsistent sensitivity patterns.  Recent history below.  Date Organism Notes  06/03/2018 E coli Pan-sensitive Treated with cipro  02/19/2018 Staph epi Ciprofloxacin-resistant Levofloxacin-resistant Bactrim-resistant Treated with nitrofurantoin  02/09/2018 Pseudomonas Coag-negative Staph Pan-sensitive Oxacillin-resistant Treated with levofloxacin, required inpatient stay   Though her most recent positive urine culture was from 06/03/2018, she reports having experienced UTI symptoms approximately 1 month ago.  She states that she requested a Cipro refill through her pharmacy which was granted.  She never saw a provider for this and states her symptoms improved with treatment.  Today, she reports a 3-day history of dysuria and left flank pain. She denies nausea, vomiting, fevers, chills, and gross hematuria.  She is not taking any medication at home to treat her symptoms. She is not on daily antibiotic prophylaxis.  She does not take cranberry supplements. She does report some chronic constipation.  In-office UA today positive for 2+ blood, 3+ protein, nitrites, and 3+ leukocyte esterase; urine microscopy with >30 WBCs/HPF and 3-10 RBCs/HPF.  Her listed allergies include ciprofloxacin and sulfa antibiotics. Per note by Dr. Wyvonne Lenz on 02/13/2018, cipro "reaction" was limited to a localized injection site reaction and should not be considered a true allergy.  Additionally,  patient reports having tolerated PO Cipro without allergic reaction as recently as 1 month ago.  PMH: Past Medical History:  Diagnosis Date   Adnexal mass 06/03/2014   since 2009   Arthritis    Complication of anesthesia    Diffuse large cell lymphoma in remission ([place]) 01/2007   NON-HODGKINS-stage 3, cd 20 positive; status post 6 cycles of R-CHOP   Dyspnea    with exertion   GERD (gastroesophageal reflux disease)    Herpes zoster without complication 05/07/4494   History of hiatal hernia    History of kidney stones    HOH (hard of hearing)    wears hearing aides   Non Hodgkin's lymphoma ([place])    PONV (postoperative nausea and vomiting)    in January lasted about 4 hours   Recurrent UTI     Surgical History: Past Surgical History:  Procedure Laterality Date   ABDOMINAL SURGERY  2009   abdominal mass+ NH lymphoma,   BACK SURGERY  01/2017   fusion. metal plate in neck at back   [place]   right eye and left ey   cervical neck fusion  1995   CYSTOSCOPY W/ RETROGRADES Left 01/30/2018   Procedure: CYSTOSCOPY WITH RETROGRADE PYELOGRAM;  Surgeon: Billey Co, MD;  Location: [facility];  Service: Urology;  Laterality: Left;   CYSTOSCOPY W/ RETROGRADES Left 08/05/2018   Procedure: CYSTOSCOPY WITH RETROGRADE PYELOGRAM;  Surgeon: Billey Co, MD;  Location: [facility];  Service: Urology;  Laterality: Left;   CYSTOSCOPY W/ URETERAL STENT PLACEMENT Left 08/05/2018   Procedure: CYSTOSCOPY WITH STENT Exchange;  Surgeon: Billey Co, MD;  Location: [facility];  Service: Urology;  Laterality: Left;   CYSTOSCOPY WITH BIOPSY Left 02/27/2018   Procedure: CYSTOSCOPY WITH BIOPSY;  Surgeon: Billey Co, MD;  Location: [facility];  Service: Urology;  Laterality: Left;   CYSTOSCOPY WITH STENT PLACEMENT Left 01/30/2018   Procedure: CYSTOSCOPY WITH STENT PLACEMENT;  Surgeon: Billey Co, MD;  Location: [facility];  Service: Urology;  Laterality: Left;    CYSTOSCOPY WITH URETEROSCOPY AND STENT PLACEMENT Left 02/27/2018   Procedure: CYSTOSCOPY WITH URETEROSCOPY AND STENT Exchange;  Surgeon: Billey Co, MD;  Location: [facility];  Service: Urology;  Laterality: Left;   DENTAL SURGERY     screws   JOINT REPLACEMENT Left    knee   KYPHOPLASTY N/A 02/21/2017   Procedure: [id number];  Surgeon: Hessie Knows, MD;  Location: [facility];  Service: Orthopedics;  Laterality: N/A;   laparotomy with biopsy  03/02/2007   PORTACATH PLACEMENT  2009   [place]   SQUAMOUS CELL CARCINOMA EXCISION     right arm   TOTAL KNEE ARTHROPLASTY Left    2009   URETEROSCOPY Left 01/30/2018   Procedure: URETEROSCOPY;  Surgeon: Billey Co, MD;  Location: [facility];  Service: Urology;  Laterality: Left;   VAGINAL HYSTERECTOMY  1971    Home Medications:  Allergies as of 09/17/2018      Reactions   Ciprofloxacin Itching   Milk-related Compounds Diarrhea, Nausea And Vomiting   Gas too   Lac Bovis Diarrhea, Nausea And Vomiting   Milk allergy Milk allergy   Sulfa Antibiotics Hives, Itching      Medication List       Accurate as of September 17, 2018  3:27 PM. If you have any questions, ask your nurse or doctor.        acetaminophen 500 MG tablet Commonly known as: TYLENOL Take 500-1,000 mg by mouth every 6 (six) hours as needed (for pain.).   ciprofloxacin 500 MG tablet Commonly known as: Cipro Take 1 tablet (500 mg total) by mouth 2 (two) times daily for 7 days. What changed: See the Monica instructions. Changed by: Debroah Loop, [name]   Combivent Respimat 20-100 MCG/ACT Aers respimat Generic drug: Ipratropium-Albuterol INHALE 2 PUFFS INTO THE LUNGS 3 TIMES DAILY What changed: See the Monica instructions.   diclofenac sodium 1 % Gel Commonly known as: VOLTAREN   hydrocortisone 2.5 % cream Apply 1 application topically 2 (two) times daily as needed (itching.).   nitrofurantoin (macrocrystal-monohydrate) 100 MG  capsule Commonly known as: MACROBID Take 1 capsule (100 mg total) by mouth every 12 (twelve) hours for 5 days. Started by: Debroah Loop, [name]   omeprazole 20 MG capsule Commonly known as: PRILOSEC Take 20 mg by mouth daily before breakfast. 30 minutes before breakfast   risedronate 35 MG tablet Commonly known as: ACTONEL Take 1 tablet (35 mg total) by mouth every Sunday.       Allergies:  Allergies  Allergen Reactions   Ciprofloxacin Itching   Milk-Related Compounds Diarrhea and Nausea And Vomiting    Gas too   Lac Bovis Diarrhea and Nausea And Vomiting    Milk allergy Milk allergy   Sulfa Antibiotics Hives and Itching    Family History: Family History  Problem Relation Age of Onset   Breast cancer Sister    Dementia Sister    Cataracts Sister    Heart attack Brother    CAD Brother    Heart attack Brother    Leukemia Grandchild        granddaughter   Kidney disease Neg Hx    Bladder Cancer Neg Hx  Social History:   reports that she quit smoking about 29 years ago. Her smoking use included cigarettes. She smoked 0.25 packs per day. She has never used smokeless tobacco. She reports current alcohol use of about 14.0 standard drinks of alcohol per week. She reports that she does not use drugs.  ROS: UROLOGY Frequent Urination?: No Hard to postpone urination?: No Burning/pain with urination?: No Get up at night to urinate?: No Leakage of urine?: No Urine stream starts and stops?: No Trouble starting stream?: No Do you have to strain to urinate?: Yes Blood in urine?: No Urinary tract infection?: Yes Sexually transmitted disease?: No Injury to kidneys or bladder?: No Painful intercourse?: No Weak stream?: Yes Currently pregnant?: No Vaginal bleeding?: No Last menstrual period?: N  Gastrointestinal Nausea?: No Vomiting?: No Indigestion/heartburn?: No Diarrhea?: No Constipation?: No  Constitutional Fever: No Night sweats?:  No Weight loss?: No Fatigue?: No  Skin Skin rash/lesions?: No Itching?: No  Eyes Blurred vision?: No Double vision?: No  Ears/Nose/Throat Sore throat?: No Sinus problems?: No  Hematologic/Lymphatic Swollen glands?: No Easy bruising?: No  Cardiovascular Leg swelling?: No Chest pain?: No  Respiratory Cough?: No Shortness of breath?: No  Endocrine Excessive thirst?: No  Musculoskeletal Back pain?: Yes Joint pain?: No  Neurological Headaches?: No Dizziness?: No  Psychologic Depression?: No Anxiety?: No  Physical Exam: BP (!) 144/78 (BP Location: Left Arm, Patient Position: Sitting, Cuff Size: Normal)    Pulse 92    Ht 5\' 2"  (1.575 m)    Wt 147 lb 14.4 oz (67.1 kg)    BMI 27.05 kg/m   Constitutional:  Alert and oriented, no acute distress, nontoxic appearing HEENT: [place], AT Cardiovascular: No clubbing, cyanosis, or edema Respiratory: Normal respiratory effort, no increased work of breathing Skin: No rashes, bruises or suspicious lesions Neurologic: Grossly intact, no focal deficits, moving all 4 extremities Psychiatric: Normal mood and affect  Laboratory Data: Results for orders placed or performed in visit on 09/17/18  Microscopic Examination   URINE  Result Value Ref Range   WBC, UA >30 (A) 0 - 5 /hpf   RBC 3-10 (A) 0 - 2 /hpf   Epithelial Cells (non renal) 0-10 0 - 10 /hpf   Bacteria, UA Few None seen/Few  Urinalysis, Complete  Result Value Ref Range   Specific Gravity, UA 1.025 1.005 - 1.030   pH, UA 6.0 5.0 - 7.5   Color, UA Yellow Yellow   Appearance Ur Cloudy (A) Clear   Leukocytes,UA 3+ (A) Negative   Protein,UA 3+ (A) Negative/Trace   Glucose, UA Negative Negative   Ketones, UA Negative Negative   RBC, UA 2+ (A) Negative   Bilirubin, UA Negative Negative   Urobilinogen, Ur 0.2 0.2 - 1.0 mg/dL   Nitrite, UA Positive (A) Negative   Microscopic Examination See below:    Assessment & Plan:   1.  Acute UTI, complicated Patient with a  history of recurrent UTI, Monica onset of dysuria, and grossly infected urine per UA.  Urine culture pending.  Given her hospitalization earlier this year with a Pseudomonas UTI, will initiate treatment today.  Historically, she has grown various bacteria with various resistance patterns on her urine cultures.  This poses a challenge for empiric treatment.  Additionally, I am concerned that she is at risk of developing ciprofloxacin resistance given the use of this antibiotic multiple times over the past several months.  However, her recent Pseudomonas UTI necessitates the use of this agent at this time.  Will also  prescribe nitrofurantoin to broaden coverage.  I hope to narrow her treatment pending urine culture results.  I counseled the patient that it is important for her to come to our office and provide a urine sample for culture every time she has a suspected UTI given her history.  She expressed an understanding of this. - Urinalysis, Complete - Urine culture - Cipro 500mg  BID x7 days - Nitrofurantoin 100mg  BID x5 days  2.  History of recurrent UTIs Patient is currently not taking prophylactic antibiotics.  Will defer to Dr. Diamantina Providence for initiation of these at her follow-up appointment in January.  In the meantime, we discussed contributing factors to recurrent UTIs.  She did report chronic constipation and she is not taking cranberry supplements at this time.  I encouraged that she start a bowel regimen and cranberry supplementation at this time.  She expressed an understanding of this plan. - Start bowel regimen with goal of having one formed bowel movement daily - Start cranberry supplementation BID on an empty stomach  Debroah Loop, [name]  [facility] [address] [place], [place] [address] [phone]

## 2018-09-17 NOTE — Patient Instructions (Signed)
1. Stay well hydrated. 2. Your goal is to have one formed bowel movement daily. Try over-the-counter supplements including Benefiber or Miralax to accomplish this. Adjust your dose of either of these to cause one daily bowel movement. You may decide to start with Benefiber first and move on to Miralax if this is not helping you. 3. Start an over-the-counter cranberry supplement for urinary tract health. Take this twice daily on an empty stomach.

## 2018-09-21 ENCOUNTER — Telehealth: Payer: Self-pay | Admitting: Physician Assistant

## 2018-09-21 LAB — CULTURE, URINE COMPREHENSIVE

## 2018-09-21 NOTE — Telephone Encounter (Signed)
I just spoke over the phone with the patient's daughter and [place], [name].  I explained that the results of her mother's urine culture indicates that she can stop the ciprofloxacin at this time.  She should remain on the prescribed nitrofurantoin for the duration of its prescribed 5-day course.  She stated that her mother is feeling better already and expressed an understanding of this plan.

## 2018-10-08 ENCOUNTER — Other Ambulatory Visit: Payer: Self-pay | Admitting: Urology

## 2018-10-08 DIAGNOSIS — Q6211 Congenital occlusion of ureteropelvic junction: Secondary | ICD-10-CM

## 2018-10-09 ENCOUNTER — Other Ambulatory Visit: Payer: Self-pay

## 2018-10-09 ENCOUNTER — Ambulatory Visit
Admission: RE | Admit: 2018-10-09 | Discharge: 2018-10-09 | Disposition: A | Discharge status: Home / Self Care | Payer: Medicare Other | Source: Ambulatory Visit | Attending: Urology | Admitting: Urology

## 2018-10-09 DIAGNOSIS — Z960 Presence of urogenital implants: Secondary | ICD-10-CM | POA: Diagnosis not present

## 2018-10-09 DIAGNOSIS — Z0389 Encounter for observation for other suspected diseases and conditions ruled out: Secondary | ICD-10-CM | POA: Diagnosis not present

## 2018-10-09 DIAGNOSIS — Q6211 Congenital occlusion of ureteropelvic junction: Secondary | ICD-10-CM | POA: Insufficient documentation

## 2018-10-12 ENCOUNTER — Ambulatory Visit (INDEPENDENT_AMBULATORY_CARE_PROVIDER_SITE_OTHER): Payer: Medicare Other | Admitting: Urology

## 2018-10-12 ENCOUNTER — Other Ambulatory Visit: Payer: Self-pay

## 2018-10-12 ENCOUNTER — Encounter: Payer: Self-pay | Admitting: Urology

## 2018-10-12 VITALS — BP 128/76 | HR 82 | Ht 62.0 in | Wt 143.0 lb

## 2018-10-12 DIAGNOSIS — N130 Hydronephrosis with ureteropelvic junction obstruction: Secondary | ICD-10-CM

## 2018-10-12 NOTE — Progress Notes (Signed)
   10/12/2018 2:16 PM   [place] 1928/06/19 [id number]  Seems as if there was a scheduling error, as patient has no complaints today.  Will be a no charge visit.  To briefly summarize, she is an 85 year old female with a left UPJ obstruction and flank pain and recurrent UTIs that have been managed relatively successfully with a chronic left ureteral stent that we are changing every 9 to 12 months.  She did have a Citrobacter UTI on 09/17/2018 treated with culture appropriate nitrofurantoin.  She has no complaints today of flank pain, dysuria, hematuria, or other urologic issues.  Renal ultrasound 10/09/2018 shows no hydronephrosis and appropriate position of left ureteral stent.  We discussed the need for chronic stent changes on a 9 to 78-monthbasis.  We also discussed considering trimethoprim prophylaxis in the future if she develops recurrent UTIs with the stent in place.  We will contact her in spring 2021 to schedule routine left stent change in the operating room under [facility].   BBilley Co [facility] [address] [place] [phone]

## 2018-10-13 ENCOUNTER — Other Ambulatory Visit: Payer: Medicare Other

## 2018-10-13 ENCOUNTER — Telehealth: Payer: Self-pay | Admitting: Physician Assistant

## 2018-10-13 ENCOUNTER — Other Ambulatory Visit: Payer: Self-pay | Admitting: Physician Assistant

## 2018-10-13 DIAGNOSIS — R300 Dysuria: Secondary | ICD-10-CM | POA: Diagnosis not present

## 2018-10-13 LAB — URINALYSIS, COMPLETE
Bilirubin, UA: NEGATIVE
Glucose, UA: NEGATIVE
Ketones, UA: NEGATIVE
Nitrite, UA: NEGATIVE
Specific Gravity, UA: 1.02 (ref 1.005–1.030)
Urobilinogen, Ur: 1 mg/dL (ref 0.2–1.0)
pH, UA: 7 (ref 5.0–7.5)

## 2018-10-13 LAB — MICROSCOPIC EXAMINATION
RBC, Urine: NONE SEEN /hpf (ref 0–2)
WBC, UA: >30 /hpf — AB (ref 0–5)

## 2018-10-13 MED ORDER — CEPHALEXIN 500 MG PO CAPS: 500.0000 mg | ORAL_CAPSULE | Freq: Two times a day (BID) | ORAL | 0 refills | Status: AC

## 2018-10-13 NOTE — Telephone Encounter (Signed)
Patient's daughter dropped off a urine sample today with concern for urinary tract infection.  Per daughter, [name], Monica Singh has been experiencing some burning with urination and confusion.  UA concerning for infection.  Keflex 500 mg twice daily x7 days sent to her pharmacy.  We will send for culture and adjust therapy as indicated.  Daughter expressed understanding of this plan.

## 2018-10-14 ENCOUNTER — Other Ambulatory Visit: Payer: Self-pay | Admitting: Family Medicine

## 2018-10-14 DIAGNOSIS — Z1231 Encounter for screening mammogram for malignant neoplasm of breast: Secondary | ICD-10-CM

## 2018-10-15 LAB — CULTURE, URINE COMPREHENSIVE

## 2018-10-16 ENCOUNTER — Other Ambulatory Visit: Payer: Self-pay | Admitting: Physician Assistant

## 2018-10-16 MED ORDER — NITROFURANTOIN MONOHYD MACRO 100 MG PO CAPS: 100.0000 mg | ORAL_CAPSULE | Freq: Every day | ORAL | 2 refills | Status: AC

## 2018-10-16 MED ORDER — AMOXICILLIN-POT CLAVULANATE 875-125 MG PO TABS: 1.0000 | ORAL_TABLET | Freq: Two times a day (BID) | ORAL | 0 refills | Status: DC

## 2018-10-16 MED ORDER — NITROFURANTOIN MONOHYD MACRO 100 MG PO CAPS: 100.0000 mg | ORAL_CAPSULE | Freq: Two times a day (BID) | ORAL | 0 refills | Status: AC

## 2018-10-16 NOTE — Progress Notes (Signed)
Will switch patient's antibiotics to Macrobid BID x7 days given sensitivities report. Discussed with Dr. Diamantina Providence; he would like to start her on Macrobid 100mg  daily suppression for 3 months. Both prescriptions sent to her pharmacy; I called patient's daughter [name] to explain. She expressed understanding.

## 2018-10-22 ENCOUNTER — Telehealth: Payer: Self-pay | Admitting: Physician Assistant

## 2018-10-22 NOTE — Telephone Encounter (Signed)
Pt's daughter called and states that her mother just finished taking Cipro. She was supposed to be taking the Macrobid that was prescribed after the Cipro. She would like a call back to discuss if she should start the [place] now. Please advise.

## 2018-10-23 NOTE — Telephone Encounter (Signed)
Spoke to [name] patient's daughter and clarified that the patient is to be taking the Macrobid daily for 3 months once she has finished the ABX for the infection. Daughter voiced understanding.

## 2018-10-27 DIAGNOSIS — M1711 Unilateral primary osteoarthritis, right knee: Secondary | ICD-10-CM | POA: Diagnosis not present

## 2018-10-30 ENCOUNTER — Other Ambulatory Visit: Payer: Self-pay

## 2018-10-30 ENCOUNTER — Telehealth: Payer: Self-pay | Admitting: Urology

## 2018-10-30 ENCOUNTER — Other Ambulatory Visit: Payer: Medicare Other

## 2018-10-30 ENCOUNTER — Other Ambulatory Visit: Payer: Self-pay | Admitting: Physician Assistant

## 2018-10-30 ENCOUNTER — Other Ambulatory Visit: Payer: Self-pay | Admitting: Family Medicine

## 2018-10-30 DIAGNOSIS — M542 Cervicalgia: Secondary | ICD-10-CM | POA: Diagnosis not present

## 2018-10-30 DIAGNOSIS — S3992XA Unspecified injury of lower back, initial encounter: Secondary | ICD-10-CM | POA: Diagnosis not present

## 2018-10-30 DIAGNOSIS — M549 Dorsalgia, unspecified: Secondary | ICD-10-CM | POA: Diagnosis not present

## 2018-10-30 DIAGNOSIS — N390 Urinary tract infection, site not specified: Secondary | ICD-10-CM

## 2018-10-30 DIAGNOSIS — S79911A Unspecified injury of right hip, initial encounter: Secondary | ICD-10-CM | POA: Diagnosis not present

## 2018-10-30 DIAGNOSIS — M25551 Pain in right hip: Secondary | ICD-10-CM | POA: Diagnosis not present

## 2018-10-30 LAB — URINALYSIS, COMPLETE
Bilirubin, UA: NEGATIVE
Nitrite, UA: POSITIVE — AB
Specific Gravity, UA: 1.025 (ref 1.005–1.030)
Urobilinogen, Ur: 4 mg/dL — ABNORMAL HIGH (ref 0.2–1.0)
pH, UA: 6 (ref 5.0–7.5)

## 2018-10-30 LAB — MICROSCOPIC EXAMINATION: WBC, UA: >30 /hpf — AB (ref 0–5)

## 2018-10-30 MED ORDER — NITROFURANTOIN MONOHYD MACRO 100 MG PO CAPS: 100.0000 mg | ORAL_CAPSULE | Freq: Every day | ORAL | 0 refills | Status: AC

## 2018-10-30 MED ORDER — CIPROFLOXACIN HCL 500 MG PO TABS: 500.0000 mg | ORAL_TABLET | Freq: Two times a day (BID) | ORAL | 0 refills | Status: AC

## 2018-10-30 NOTE — Telephone Encounter (Signed)
Please review urine and advise.  

## 2018-10-30 NOTE — Telephone Encounter (Signed)
Pt's daughter brought a urine in.  Pt fell and can hardly walk.  Pt is talking out of her head, urine is dark and cloudy, no other symptoms.

## 2018-10-30 NOTE — Telephone Encounter (Signed)
Urine is nitrite positive and appears grossly infected. Patient with indwelling left ureteral stent; I have treated 2 other UTIs in her in the past 6 weeks and started her on daily Macrobid PPX last month. Most likely has a UTI, however given her fall and AMS, strongly recommend ED evaluation for other contributing pathologies. Called daughter back, LMOM to return my call.

## 2018-10-30 NOTE — Telephone Encounter (Signed)
Spoke to patient's daughter and she states she has been at [facility] since 10 AM this morning. The did several xrays and an extensive workup from her fall. She states there is no broken bones.

## 2018-10-30 NOTE — Telephone Encounter (Signed)
Per Dr. Diamantina Providence, would like to start her on antibiotics today. Cipro 500mg  BID x10 days and Nitrofurantoin 100mg  daily x 10 days sent to her pharmacy. Macrobid intended to augment her already-scheduled daily antibiotic dose for a total of 100mg  BID x10 days. Will send urine for culture and adjust therapy as indicated.  Called daughter and [facility] explaining therapy as above.

## 2018-11-03 LAB — CULTURE, URINE COMPREHENSIVE

## 2018-11-03 NOTE — Progress Notes (Signed)
Patient already prescribed Cipro.  She should continue this.  No need to continue Macrobid twice daily therapeutic dose, she can return to her once daily suppressive dose at this time.  I spoke with the patient's daughter and informed her of this.  She expressed understanding.

## 2018-11-13 ENCOUNTER — Encounter: Payer: Self-pay | Admitting: Family Medicine

## 2018-11-18 ENCOUNTER — Ambulatory Visit
Admission: RE | Admit: 2018-11-18 | Discharge: 2018-11-18 | Disposition: A | Discharge status: Home / Self Care | Payer: Medicare Other | Source: Ambulatory Visit | Attending: Family Medicine | Admitting: Family Medicine

## 2018-11-18 DIAGNOSIS — Z1231 Encounter for screening mammogram for malignant neoplasm of breast: Secondary | ICD-10-CM | POA: Diagnosis not present

## 2018-11-18 HISTORY — DX: Personal history of antineoplastic chemotherapy: Z92.21

## 2018-11-27 DIAGNOSIS — M533 Sacrococcygeal disorders, not elsewhere classified: Secondary | ICD-10-CM | POA: Diagnosis not present

## 2018-11-27 DIAGNOSIS — M461 Sacroiliitis, not elsewhere classified: Secondary | ICD-10-CM | POA: Diagnosis not present

## 2018-12-02 DIAGNOSIS — M533 Sacrococcygeal disorders, not elsewhere classified: Secondary | ICD-10-CM | POA: Diagnosis not present

## 2018-12-02 DIAGNOSIS — M25552 Pain in left hip: Secondary | ICD-10-CM | POA: Diagnosis not present

## 2018-12-09 DIAGNOSIS — L570 Actinic keratosis: Secondary | ICD-10-CM | POA: Diagnosis not present

## 2018-12-09 DIAGNOSIS — L820 Inflamed seborrheic keratosis: Secondary | ICD-10-CM | POA: Diagnosis not present

## 2018-12-09 DIAGNOSIS — L578 Other skin changes due to chronic exposure to nonionizing radiation: Secondary | ICD-10-CM | POA: Diagnosis not present

## 2018-12-09 DIAGNOSIS — L821 Other seborrheic keratosis: Secondary | ICD-10-CM | POA: Diagnosis not present

## 2018-12-09 DIAGNOSIS — Z1283 Encounter for screening for malignant neoplasm of skin: Secondary | ICD-10-CM | POA: Diagnosis not present

## 2018-12-13 NOTE — Discharge Instructions (Signed)
Instructions after Total Knee Replacement   Monica Suder P. Monica Singh, Jr., M.D.     Dept. of Orthopaedics & Sports Medicine  [facility]  [address]  [place], [place]  [address]  Phone: [phone]   Fax: [phone]    DIET: Drink plenty of non-alcoholic fluids. Resume your normal diet. Include foods high in fiber.  ACTIVITY:  You may use crutches or a walker with weight-bearing as tolerated, unless instructed otherwise. You may be weaned off of the walker or crutches by your Physical Therapist.  Do NOT place pillows under the knee. Anything placed under the knee could limit your ability to straighten the knee.   Continue doing gentle exercises. Exercising will reduce the pain and swelling, increase motion, and prevent muscle weakness.   Please continue to use the TED compression stockings for 6 weeks. You may remove the stockings at night, but should reapply them in the morning. Do not drive or operate any equipment until instructed.  WOUND CARE:  Continue to use the PolarCare or ice packs periodically to reduce pain and swelling. You may bathe or shower after the staples are removed at the first office visit following surgery.  MEDICATIONS: You may resume your regular medications. Please take the pain medication as prescribed on the medication. Do not take pain medication on an empty stomach. You have been given a prescription for a blood thinner (Lovenox or Coumadin). Please take the medication as instructed. (NOTE: After completing a 2 week course of Lovenox, take one Enteric-coated aspirin once a day. This along with elevation will help reduce the possibility of phlebitis in your operated leg.) Do not drive or drink alcoholic beverages when taking pain medications.  CALL THE OFFICE FOR: Temperature above 101 degrees Excessive bleeding or drainage on the dressing. Excessive swelling, coldness, or paleness of the toes. Persistent nausea and vomiting.  FOLLOW-UP:  You  should have an appointment to return to the office in 10-14 days after surgery. Arrangements have been made for continuation of Physical Therapy (either home therapy or outpatient therapy).   [facility] Department Directory         [email or web address]       [email or web address]          Cardiology  Appointments: [place] - [phone] [place] - [phone]  Endocrinology  Appointments: [place] - [phone] [place] - [phone]  Gastroenterology  Appointments: [place] - [phone] [place] - [phone]        General Surgery   Appointments: [place] - [phone]  Internal Medicine/Family Medicine  Appointments: [place] - [phone] [place] - [phone] [place] - [phone]  Metabolic and Weigh Loss Surgery  Appointments: [place] - [phone]        Neurology  Appointments: [place] - [phone] [place] - [phone]  Neurosurgery  Appointments: [place] - [phone]  Obstetrics & Gynecology  Appointments: [place] - [phone] [place] - [phone]        Pediatrics  Appointments: [place] - [phone] [place] - [phone]  Physiatry  Appointments: [place] -[phone]  Physical Therapy  Appointments: [place] - [phone] [place] - [phone]        Podiatry  Appointments: [place] - [phone] [place] - [phone]  Pulmonology  Appointments: [place] - [phone]  Rheumatology  Appointments: [place] - [phone]        [place] Location: [facility]  [address] [place], [place]  [address]  [place] Location: [facility] [address] [place], [place]  [address]  [place] Location: [facility] [address] [place], [place]  [address]    

## 2018-12-15 DIAGNOSIS — M25561 Pain in right knee: Secondary | ICD-10-CM | POA: Diagnosis not present

## 2018-12-16 ENCOUNTER — Other Ambulatory Visit: Payer: Self-pay

## 2018-12-16 ENCOUNTER — Encounter
Admission: RE | Admit: 2018-12-16 | Discharge: 2018-12-16 | Disposition: A | Discharge status: Home / Self Care | Payer: Medicare Other | Source: Ambulatory Visit | Attending: Orthopedic Surgery | Admitting: Orthopedic Surgery

## 2018-12-16 DIAGNOSIS — Z01818 Encounter for other preprocedural examination: Secondary | ICD-10-CM | POA: Insufficient documentation

## 2018-12-16 LAB — COMPREHENSIVE METABOLIC PANEL
ALT: 16 U/L (ref 0–44)
AST: 17 U/L (ref 15–41)
Albumin: 4.3 g/dL (ref 3.5–5.0)
Alkaline Phosphatase: 78 U/L (ref 38–126)
Anion gap: 9 (ref 5–15)
BUN: 16 mg/dL (ref 8–23)
CO2: 28 mmol/L (ref 22–32)
Calcium: 9.4 mg/dL (ref 8.9–10.3)
Chloride: 100 mmol/L (ref 98–111)
Creatinine, Ser: 0.77 mg/dL (ref 0.44–1.00)
GFR calc Af Amer: >60 mL/min (ref >60)
GFR calc non Af Amer: >60 mL/min (ref >60)
Glucose, Bld: 106 mg/dL — ABNORMAL HIGH (ref 70–99)
Potassium: 4.7 mmol/L (ref 3.5–5.1)
Sodium: 137 mmol/L (ref 135–145)
Total Bilirubin: 1.2 mg/dL (ref 0.3–1.2)
Total Protein: 7.6 g/dL (ref 6.5–8.1)

## 2018-12-16 LAB — PROTIME-INR
INR: 1 (ref 0.8–1.2)
Prothrombin Time: 12.6 seconds (ref 11.4–15.2)

## 2018-12-16 LAB — CBC
HCT: 41.4 % (ref 36.0–46.0)
Hemoglobin: 13.5 g/dL (ref 12.0–15.0)
MCH: 30.6 pg (ref 26.0–34.0)
MCHC: 32.6 g/dL (ref 30.0–36.0)
MCV: 93.9 fL (ref 80.0–100.0)
Platelets: 356 10*3/uL (ref 150–400)
RBC: 4.41 MIL/uL (ref 3.87–5.11)
RDW: 12.3 % (ref 11.5–15.5)
WBC: 7.4 10*3/uL (ref 4.0–10.5)
nRBC: 0 % (ref 0.0–0.2)

## 2018-12-16 LAB — URINALYSIS, ROUTINE W REFLEX MICROSCOPIC
Bilirubin Urine: NEGATIVE
Glucose, UA: NEGATIVE mg/dL
Ketones, ur: NEGATIVE mg/dL
Nitrite: NEGATIVE
Protein, ur: NEGATIVE mg/dL
Specific Gravity, Urine: 1.016 (ref 1.005–1.030)
pH: 7 (ref 5.0–8.0)

## 2018-12-16 LAB — TYPE AND SCREEN
ABO/RH(D): O POS
Antibody Screen: NEGATIVE

## 2018-12-16 LAB — SURGICAL PCR SCREEN
MRSA, PCR: NEGATIVE
Staphylococcus aureus: NEGATIVE

## 2018-12-16 LAB — APTT: aPTT: 31 seconds (ref 24–36)

## 2018-12-16 LAB — C-REACTIVE PROTEIN: CRP: <0.8 mg/dL (ref <1.0)

## 2018-12-16 LAB — SEDIMENTATION RATE: Sed Rate: 7 mm/hr (ref 0–30)

## 2018-12-16 NOTE — Patient Instructions (Signed)
Your COVID test is scheduled on Friday 12/18/2018. Drive up in front of [facility] building any time 8:00-10:30am and remain in your vehicle.  Your procedure is scheduled on: Wednesday 12/23/2018 Report to Same Day Surgery 2nd floor [facility] Entrance-take elevator on left to 2nd floor.  Check in with surgery information desk.) To find out your arrival time, call [phone] 1:00-3:00 PM on Tuesday 12/22/2018  Remember: Instructions that are not followed completely may result in serious medical risk, up to and including death, or upon the discretion of your surgeon and anesthesiologist your surgery may need to be rescheduled.    __x__ 1. Do not eat food (including mints, candies, chewing gum) after midnight the night before your procedure. You may drink clear liquids up to 2 hours before you are scheduled to arrive at the hospital for your procedure.  Do not drink anything within 2 hours of your scheduled arrival to the hospital.  Approved clear liquids:  --Water or Apple juice without pulp  --Clear carbohydrate beverage such as Gatorade or Powerade  --Black Coffee or Clear Tea (No milk, no creamers, do not add anything to the coffee or tea)  Finish your provided Ensure drink 2 hours before your scheduled arrival time on the day of surgery.    __x__ 2. No Alcohol or smoking for 24 hours before or after surgery.   __x__ 3. Notify your doctor if there is any change in your medical condition (cold, fever, infections).   __x__ 4. On the morning of surgery brush your teeth with toothpaste and water.  You may rinse your mouth with mouthwash if you wish.  Do not swallow any toothpaste or mouthwash.  Please read over the following fact sheets that you were given:   [facility] Preparing for Surgery and/or MRSA Information    __x__ Use CHG Soap as directed on instruction sheet.   Do not wear jewelry, make-up, hairpins, clips or nail polish on the day of surgery.  Do  not wear lotions, powders, deodorant, or perfumes.   Do not shave for 48 hours prior to surgery.   Do not bring valuables to the hospital.    [facility] is not responsible for any belongings or valuables.               Contacts, glasses dentures, hearing aids may not be worn into surgery.  For patients admitted to the hospital, discharge time is determined by your treatment team.  __x__ Take these medicines on the morning of surgery:  1. Combivent inhaler AND bring with you to the hospital  2. Tylenol if needed for pain  __x__ Follow recommendations from Cardiologist, Pulmonologist or PCP regarding stopping Aspirin, Coumadin, Plavix, Eliquis, Effient, Pradaxa, and Pletal.  __x__ TODAY: Stop Anti-inflammatories such as Advil, Ibuprofen, Motrin, Aleve, Naproxen, Naprosyn, BC/Goodies powders or aspirin products. You may continue to take Tylenol and Celebrex.   __x__ TODAY: Stop supplements until after surgery. You may continue to take Vitamin D, Vitamin B, and multivitamin.  Please bring a copy of your Medical Advance Directives ([place], Living Will) on the day of surgery.

## 2018-12-17 ENCOUNTER — Encounter: Payer: Self-pay | Admitting: Family Medicine

## 2018-12-17 LAB — URINE CULTURE
Culture: <10000 — AB
Special Requests: NORMAL

## 2018-12-18 ENCOUNTER — Other Ambulatory Visit: Payer: Self-pay

## 2018-12-18 ENCOUNTER — Other Ambulatory Visit
Admission: RE | Admit: 2018-12-18 | Discharge: 2018-12-18 | Disposition: A | Discharge status: Home / Self Care | Payer: Medicare Other | Source: Ambulatory Visit | Attending: Orthopedic Surgery | Admitting: Orthopedic Surgery

## 2018-12-18 DIAGNOSIS — Z20828 Contact with and (suspected) exposure to other viral communicable diseases: Secondary | ICD-10-CM | POA: Insufficient documentation

## 2018-12-18 DIAGNOSIS — Z01812 Encounter for preprocedural laboratory examination: Secondary | ICD-10-CM | POA: Insufficient documentation

## 2018-12-18 LAB — SARS CORONAVIRUS 2 (TAT 6-24 HRS): SARS Coronavirus 2: NEGATIVE

## 2018-12-23 ENCOUNTER — Inpatient Hospital Stay: Payer: Medicare Other | Admitting: Anesthesiology

## 2018-12-23 ENCOUNTER — Other Ambulatory Visit: Payer: Self-pay

## 2018-12-23 ENCOUNTER — Encounter
Admission: RE | Disposition: A | Discharge status: Home / Self Care | Payer: Self-pay | Source: Home / Self Care | Attending: Orthopedic Surgery

## 2018-12-23 ENCOUNTER — Inpatient Hospital Stay
Admission: RE | Admit: 2018-12-23 | Discharge: 2018-12-26 | DRG: 470 | Disposition: A | Discharge status: Home / Self Care | Payer: Medicare Other | Attending: Orthopedic Surgery | Admitting: Orthopedic Surgery

## 2018-12-23 ENCOUNTER — Inpatient Hospital Stay: Payer: Medicare Other

## 2018-12-23 ENCOUNTER — Encounter: Payer: Self-pay | Admitting: Orthopedic Surgery

## 2018-12-23 DIAGNOSIS — M1711 Unilateral primary osteoarthritis, right knee: Principal | ICD-10-CM | POA: Diagnosis present

## 2018-12-23 DIAGNOSIS — Z90710 Acquired absence of both cervix and uterus: Secondary | ICD-10-CM

## 2018-12-23 DIAGNOSIS — Z96651 Presence of right artificial knee joint: Secondary | ICD-10-CM | POA: Diagnosis not present

## 2018-12-23 DIAGNOSIS — Z9221 Personal history of antineoplastic chemotherapy: Secondary | ICD-10-CM

## 2018-12-23 DIAGNOSIS — G8929 Other chronic pain: Secondary | ICD-10-CM | POA: Diagnosis present

## 2018-12-23 DIAGNOSIS — Z87442 Personal history of urinary calculi: Secondary | ICD-10-CM

## 2018-12-23 DIAGNOSIS — Z79899 Other long term (current) drug therapy: Secondary | ICD-10-CM | POA: Diagnosis not present

## 2018-12-23 DIAGNOSIS — Z96659 Presence of unspecified artificial knee joint: Secondary | ICD-10-CM

## 2018-12-23 DIAGNOSIS — M810 Age-related osteoporosis without current pathological fracture: Secondary | ICD-10-CM | POA: Diagnosis present

## 2018-12-23 DIAGNOSIS — K219 Gastro-esophageal reflux disease without esophagitis: Secondary | ICD-10-CM | POA: Diagnosis present

## 2018-12-23 DIAGNOSIS — Z96652 Presence of left artificial knee joint: Secondary | ICD-10-CM | POA: Diagnosis present

## 2018-12-23 DIAGNOSIS — Z87440 Personal history of urinary (tract) infections: Secondary | ICD-10-CM

## 2018-12-23 DIAGNOSIS — H9190 Unspecified hearing loss, unspecified ear: Secondary | ICD-10-CM | POA: Diagnosis present

## 2018-12-23 DIAGNOSIS — Z20828 Contact with and (suspected) exposure to other viral communicable diseases: Secondary | ICD-10-CM | POA: Diagnosis present

## 2018-12-23 DIAGNOSIS — Z8572 Personal history of non-Hodgkin lymphomas: Secondary | ICD-10-CM | POA: Diagnosis not present

## 2018-12-23 DIAGNOSIS — Z471 Aftercare following joint replacement surgery: Secondary | ICD-10-CM | POA: Diagnosis not present

## 2018-12-23 HISTORY — PX: KNEE ARTHROPLASTY: SHX992

## 2018-12-23 LAB — ABO/RH: ABO/RH(D): O POS

## 2018-12-23 SURGERY — ARTHROPLASTY, KNEE, TOTAL, USING IMAGELESS COMPUTER-ASSISTED NAVIGATION
Anesthesia: Spinal | Site: Knee | Laterality: Right

## 2018-12-23 MED ORDER — MIDAZOLAM HCL 2 MG/2ML IJ SOLN
INTRAMUSCULAR | Status: AC
  Filled 2018-12-23: qty 2

## 2018-12-23 MED ORDER — TRANEXAMIC ACID-NACL 1000-0.7 MG/100ML-% IV SOLN
1000.0000 mg | Freq: Once | INTRAVENOUS | Status: AC
  Administered 2018-12-23: 15:00:00 1000 mg via INTRAVENOUS

## 2018-12-23 MED ORDER — HYDROMORPHONE HCL 1 MG/ML IJ SOLN: 0.5000 mg | INTRAMUSCULAR | Status: DC | PRN

## 2018-12-23 MED ORDER — BUPIVACAINE HCL (PF) 0.25 % IJ SOLN
INTRAMUSCULAR | Status: AC
  Filled 2018-12-23: qty 60

## 2018-12-23 MED ORDER — TRANEXAMIC ACID-NACL 1000-0.7 MG/100ML-% IV SOLN
INTRAVENOUS | Status: AC
  Administered 2018-12-23: 1000 mg via INTRAVENOUS
  Filled 2018-12-23: qty 100

## 2018-12-23 MED ORDER — GABAPENTIN 300 MG PO CAPS
300.0000 mg | ORAL_CAPSULE | Freq: Once | ORAL | Status: AC
  Administered 2018-12-23: 11:00:00 300 mg via ORAL

## 2018-12-23 MED ORDER — BISACODYL 10 MG RE SUPP: 10.0000 mg | Freq: Every day | RECTAL | Status: DC | PRN

## 2018-12-23 MED ORDER — ACETAMINOPHEN 10 MG/ML IV SOLN
INTRAVENOUS | Status: AC
  Filled 2018-12-23: qty 100

## 2018-12-23 MED ORDER — CELECOXIB 200 MG PO CAPS
200.0000 mg | ORAL_CAPSULE | Freq: Two times a day (BID) | ORAL | Status: DC
  Administered 2018-12-23 – 2018-12-26 (×6): 200 mg via ORAL
  Filled 2018-12-23 (×6): qty 1

## 2018-12-23 MED ORDER — MAGNESIUM HYDROXIDE 400 MG/5ML PO SUSP
30.0000 mL | Freq: Every day | ORAL | Status: DC | PRN
  Administered 2018-12-25: 30 mL via ORAL
  Filled 2018-12-23: qty 30

## 2018-12-23 MED ORDER — ONDANSETRON HCL 4 MG PO TABS
4.0000 mg | ORAL_TABLET | Freq: Four times a day (QID) | ORAL | Status: DC | PRN
  Filled 2018-12-23 (×2): qty 1

## 2018-12-23 MED ORDER — BSS IO SOLN
15.0000 mL | Freq: Once | INTRAOCULAR | Status: DC
  Filled 2018-12-23: qty 15

## 2018-12-23 MED ORDER — BUPIVACAINE HCL (PF) 0.5 % IJ SOLN
INTRAMUSCULAR | Status: DC | PRN
  Administered 2018-12-23: 2.5 mL

## 2018-12-23 MED ORDER — DEXAMETHASONE SODIUM PHOSPHATE 10 MG/ML IJ SOLN
INTRAMUSCULAR | Status: AC
  Administered 2018-12-23: 8 mg via INTRAVENOUS
  Filled 2018-12-23: qty 1

## 2018-12-23 MED ORDER — LACTATED RINGERS IV SOLN
INTRAVENOUS | Status: DC
  Administered 2018-12-23 – 2018-12-24 (×2): via INTRAVENOUS

## 2018-12-23 MED ORDER — SODIUM CHLORIDE FLUSH 0.9 % IV SOLN
INTRAVENOUS | Status: AC
  Filled 2018-12-23: qty 40

## 2018-12-23 MED ORDER — TETRACAINE HCL 1 % IJ SOLN
INTRAMUSCULAR | Status: DC | PRN
  Administered 2018-12-23: 5 mg via INTRASPINAL

## 2018-12-23 MED ORDER — TRANEXAMIC ACID-NACL 1000-0.7 MG/100ML-% IV SOLN
1000.0000 mg | INTRAVENOUS | Status: AC
  Administered 2018-12-23: 12:00:00 1000 mg via INTRAVENOUS

## 2018-12-23 MED ORDER — SODIUM CHLORIDE 0.9 % IV SOLN
INTRAVENOUS | Status: DC | PRN
  Administered 2018-12-23: 60 mL

## 2018-12-23 MED ORDER — OXYCODONE HCL 5 MG PO TABS
5.0000 mg | ORAL_TABLET | ORAL | Status: DC | PRN
  Administered 2018-12-23 – 2018-12-24 (×2): 5 mg via ORAL
  Administered 2018-12-24 – 2018-12-26 (×4): 10 mg via ORAL
  Filled 2018-12-23 (×3): qty 2
  Filled 2018-12-23 (×2): qty 1
  Filled 2018-12-23 (×2): qty 2

## 2018-12-23 MED ORDER — CELECOXIB 200 MG PO CAPS
400.0000 mg | ORAL_CAPSULE | Freq: Once | ORAL | Status: AC
  Administered 2018-12-23: 11:00:00 400 mg via ORAL

## 2018-12-23 MED ORDER — BUPIVACAINE HCL (PF) 0.5 % IJ SOLN
INTRAMUSCULAR | Status: AC
  Filled 2018-12-23: qty 10

## 2018-12-23 MED ORDER — IPRATROPIUM-ALBUTEROL 0.5-2.5 (3) MG/3ML IN SOLN
3.0000 mL | Freq: Every day | RESPIRATORY_TRACT | Status: DC
  Administered 2018-12-24: 3 mL via RESPIRATORY_TRACT
  Filled 2018-12-23: qty 3

## 2018-12-23 MED ORDER — TRANEXAMIC ACID-NACL 1000-0.7 MG/100ML-% IV SOLN
INTRAVENOUS | Status: AC
  Filled 2018-12-23: qty 100

## 2018-12-23 MED ORDER — ONDANSETRON HCL 4 MG/2ML IJ SOLN: 4.0000 mg | Freq: Four times a day (QID) | INTRAMUSCULAR | Status: DC | PRN

## 2018-12-23 MED ORDER — LIDOCAINE HCL (CARDIAC) PF 100 MG/5ML IV SOSY
PREFILLED_SYRINGE | INTRAVENOUS | Status: DC | PRN
  Administered 2018-12-23: 40 mg via INTRAVENOUS

## 2018-12-23 MED ORDER — CHLORHEXIDINE GLUCONATE 4 % EX LIQD
60.0000 mL | Freq: Once | CUTANEOUS | Status: AC
  Administered 2018-12-23: 4 via TOPICAL

## 2018-12-23 MED ORDER — SODIUM CHLORIDE 0.9 % IV SOLN
INTRAVENOUS | Status: DC | PRN
  Administered 2018-12-23: 20 ug/min via INTRAVENOUS
  Administered 2018-12-23: 30 ug/min via INTRAVENOUS

## 2018-12-23 MED ORDER — PROPOFOL 10 MG/ML IV BOLUS
INTRAVENOUS | Status: DC | PRN
  Administered 2018-12-23 (×4): 11 mg via INTRAVENOUS

## 2018-12-23 MED ORDER — MENTHOL 3 MG MT LOZG
1.0000 | LOZENGE | OROMUCOSAL | Status: DC | PRN
  Filled 2018-12-23: qty 9

## 2018-12-23 MED ORDER — METOCLOPRAMIDE HCL 10 MG PO TABS: 5.0000 mg | ORAL_TABLET | Freq: Three times a day (TID) | ORAL | Status: DC | PRN

## 2018-12-23 MED ORDER — EPHEDRINE SULFATE 50 MG/ML IJ SOLN
INTRAMUSCULAR | Status: DC | PRN
  Administered 2018-12-23: 5 mg via INTRAVENOUS

## 2018-12-23 MED ORDER — METOCLOPRAMIDE HCL 5 MG/ML IJ SOLN: 5.0000 mg | Freq: Three times a day (TID) | INTRAMUSCULAR | Status: DC | PRN

## 2018-12-23 MED ORDER — SENNOSIDES-DOCUSATE SODIUM 8.6-50 MG PO TABS
1.0000 | ORAL_TABLET | Freq: Two times a day (BID) | ORAL | Status: DC
  Administered 2018-12-23 – 2018-12-26 (×6): 1 via ORAL
  Filled 2018-12-23 (×6): qty 1

## 2018-12-23 MED ORDER — TRAMADOL HCL 50 MG PO TABS: 50.0000 mg | ORAL_TABLET | ORAL | Status: DC | PRN

## 2018-12-23 MED ORDER — FENTANYL CITRATE (PF) 100 MCG/2ML IJ SOLN
INTRAMUSCULAR | Status: DC | PRN
  Administered 2018-12-23: 50 ug via INTRAVENOUS

## 2018-12-23 MED ORDER — FAMOTIDINE 20 MG PO TABS
20.0000 mg | ORAL_TABLET | Freq: Once | ORAL | Status: AC
  Administered 2018-12-23: 11:00:00 20 mg via ORAL

## 2018-12-23 MED ORDER — PROPOFOL 500 MG/50ML IV EMUL
INTRAVENOUS | Status: AC
  Filled 2018-12-23: qty 50

## 2018-12-23 MED ORDER — GABAPENTIN 300 MG PO CAPS
ORAL_CAPSULE | ORAL | Status: AC
  Administered 2018-12-23: 300 mg via ORAL
  Filled 2018-12-23: qty 1

## 2018-12-23 MED ORDER — FLEET ENEMA 7-19 GM/118ML RE ENEM: 1.0000 | ENEMA | Freq: Once | RECTAL | Status: DC | PRN

## 2018-12-23 MED ORDER — KETOROLAC TROMETHAMINE 0.5 % OP SOLN
1.0000 [drp] | Freq: Three times a day (TID) | OPHTHALMIC | Status: DC | PRN
  Administered 2018-12-23: 1 [drp] via OPHTHALMIC
  Filled 2018-12-23: qty 3

## 2018-12-23 MED ORDER — PHENOL 1.4 % MT LIQD
1.0000 | OROMUCOSAL | Status: DC | PRN
  Filled 2018-12-23: qty 177

## 2018-12-23 MED ORDER — GABAPENTIN 300 MG PO CAPS
300.0000 mg | ORAL_CAPSULE | Freq: Every day | ORAL | Status: DC
  Administered 2018-12-23 – 2018-12-25 (×3): 300 mg via ORAL
  Filled 2018-12-23 (×3): qty 1

## 2018-12-23 MED ORDER — ACETAMINOPHEN 10 MG/ML IV SOLN
INTRAVENOUS | Status: DC | PRN
  Administered 2018-12-23: 1000 mg via INTRAVENOUS

## 2018-12-23 MED ORDER — FENTANYL CITRATE (PF) 100 MCG/2ML IJ SOLN: 25.0000 ug | INTRAMUSCULAR | Status: DC | PRN

## 2018-12-23 MED ORDER — CEFAZOLIN SODIUM-DEXTROSE 1-4 GM/50ML-% IV SOLN
1.0000 g | Freq: Four times a day (QID) | INTRAVENOUS | Status: AC
  Administered 2018-12-23 – 2018-12-24 (×2): 1 g via INTRAVENOUS
  Filled 2018-12-23 (×2): qty 50

## 2018-12-23 MED ORDER — OXYCODONE HCL 5 MG PO TABS: 10.0000 mg | ORAL_TABLET | ORAL | Status: DC | PRN

## 2018-12-23 MED ORDER — NEOMYCIN-POLYMYXIN B GU 40-200000 IR SOLN
Status: AC
  Filled 2018-12-23: qty 20

## 2018-12-23 MED ORDER — BUPIVACAINE LIPOSOME 1.3 % IJ SUSP
INTRAMUSCULAR | Status: AC
  Filled 2018-12-23: qty 20

## 2018-12-23 MED ORDER — CEFAZOLIN SODIUM-DEXTROSE 2-4 GM/100ML-% IV SOLN
2.0000 g | INTRAVENOUS | Status: AC
  Administered 2018-12-23: 2 g via INTRAVENOUS

## 2018-12-23 MED ORDER — BUPIVACAINE HCL (PF) 0.25 % IJ SOLN
INTRAMUSCULAR | Status: DC | PRN
  Administered 2018-12-23: 60 mL

## 2018-12-23 MED ORDER — FERROUS SULFATE 325 (65 FE) MG PO TABS
325.0000 mg | ORAL_TABLET | Freq: Three times a day (TID) | ORAL | Status: DC
  Administered 2018-12-24 – 2018-12-26 (×7): 325 mg via ORAL
  Filled 2018-12-23 (×7): qty 1

## 2018-12-23 MED ORDER — DEXAMETHASONE SODIUM PHOSPHATE 10 MG/ML IJ SOLN
8.0000 mg | Freq: Once | INTRAMUSCULAR | Status: AC
  Administered 2018-12-23: 11:00:00 8 mg via INTRAVENOUS

## 2018-12-23 MED ORDER — FENTANYL CITRATE (PF) 100 MCG/2ML IJ SOLN
INTRAMUSCULAR | Status: AC
  Filled 2018-12-23: qty 2

## 2018-12-23 MED ORDER — ENSURE PRE-SURGERY PO LIQD
296.0000 mL | Freq: Once | ORAL | Status: AC
  Administered 2018-12-23: 296 mL via ORAL
  Filled 2018-12-23: qty 296

## 2018-12-23 MED ORDER — FAMOTIDINE 20 MG PO TABS
ORAL_TABLET | ORAL | Status: AC
  Administered 2018-12-23: 20 mg via ORAL
  Filled 2018-12-23: qty 1

## 2018-12-23 MED ORDER — CEFAZOLIN SODIUM-DEXTROSE 2-4 GM/100ML-% IV SOLN
INTRAVENOUS | Status: AC
  Filled 2018-12-23: qty 100

## 2018-12-23 MED ORDER — METOCLOPRAMIDE HCL 10 MG PO TABS
10.0000 mg | ORAL_TABLET | Freq: Three times a day (TID) | ORAL | Status: AC
  Administered 2018-12-23 – 2018-12-25 (×8): 10 mg via ORAL
  Filled 2018-12-23 (×8): qty 1

## 2018-12-23 MED ORDER — LIDOCAINE HCL (PF) 2 % IJ SOLN
INTRAMUSCULAR | Status: AC
  Filled 2018-12-23: qty 10

## 2018-12-23 MED ORDER — CELECOXIB 200 MG PO CAPS
ORAL_CAPSULE | ORAL | Status: AC
  Administered 2018-12-23: 400 mg via ORAL
  Filled 2018-12-23: qty 2

## 2018-12-23 MED ORDER — ENOXAPARIN SODIUM 30 MG/0.3ML ~~LOC~~ SOLN
30.0000 mg | Freq: Two times a day (BID) | SUBCUTANEOUS | Status: DC
  Administered 2018-12-24 – 2018-12-26 (×5): 30 mg via SUBCUTANEOUS
  Filled 2018-12-23 (×5): qty 0.3

## 2018-12-23 MED ORDER — ACETAMINOPHEN 325 MG PO TABS
325.0000 mg | ORAL_TABLET | Freq: Four times a day (QID) | ORAL | Status: DC | PRN
  Administered 2018-12-24: 650 mg via ORAL
  Filled 2018-12-23: qty 2

## 2018-12-23 MED ORDER — PROPOFOL 500 MG/50ML IV EMUL
INTRAVENOUS | Status: DC | PRN
  Administered 2018-12-23: 30 ug/kg/min via INTRAVENOUS

## 2018-12-23 MED ORDER — NEOMYCIN-POLYMYXIN B GU 40-200000 IR SOLN
Status: DC | PRN
  Administered 2018-12-23: 14 mL

## 2018-12-23 MED ORDER — ACETAMINOPHEN 10 MG/ML IV SOLN
1000.0000 mg | Freq: Four times a day (QID) | INTRAVENOUS | Status: AC
  Administered 2018-12-23 – 2018-12-24 (×3): 1000 mg via INTRAVENOUS
  Filled 2018-12-23 (×3): qty 100

## 2018-12-23 MED ORDER — PANTOPRAZOLE SODIUM 40 MG PO TBEC
40.0000 mg | DELAYED_RELEASE_TABLET | Freq: Two times a day (BID) | ORAL | Status: DC
  Administered 2018-12-23 – 2018-12-26 (×6): 40 mg via ORAL
  Filled 2018-12-23 (×6): qty 1

## 2018-12-23 SURGICAL SUPPLY — 74 items
BATTERY INSTRU NAVIGATION (MISCELLANEOUS) ×12 IMPLANT
BLADE SAW 70X12.5 (BLADE) ×3 IMPLANT
BLADE SAW 90X13X1.19 OSCILLAT (BLADE) ×3 IMPLANT
BLADE SAW 90X25X1.19 OSCILLAT (BLADE) ×3 IMPLANT
CANISTER SUCT 3000ML PPV (MISCELLANEOUS) ×3 IMPLANT
CEMENT HV SMART SET (Cement) ×4 IMPLANT
CEMENT TIBIA MBT SIZE 2.5 (Knees) IMPLANT
COOLER ICEMAN CLASSIC (MISCELLANEOUS) ×3 IMPLANT
COVER WAND RF STERILE (DRAPES) ×3 IMPLANT
CUFF TOURN SGL QUICK 24 (TOURNIQUET CUFF)
CUFF TOURN SGL QUICK 30 (TOURNIQUET CUFF)
CUFF TRNQT CYL 24X4X16.5-23 (TOURNIQUET CUFF) IMPLANT
CUFF TRNQT CYL 30X4X21-28X (TOURNIQUET CUFF) IMPLANT
DRAPE 3/4 80X56 (DRAPES) ×3 IMPLANT
DRSG DERMACEA 8X12 NADH (GAUZE/BANDAGES/DRESSINGS) ×3 IMPLANT
DRSG OPSITE POSTOP 4X14 (GAUZE/BANDAGES/DRESSINGS) ×3 IMPLANT
DRSG TEGADERM 4X4.75 (GAUZE/BANDAGES/DRESSINGS) ×3 IMPLANT
DURAPREP 26ML APPLICATOR (WOUND CARE) ×6 IMPLANT
ELECT REM PT RETURN 9FT ADLT (ELECTROSURGICAL) ×3
ELECTRODE REM PT RTRN 9FT ADLT (ELECTROSURGICAL) ×1 IMPLANT
EX-PIN ORTHOLOCK NAV 4X150 (PIN) ×6 IMPLANT
FEMUR SIGMA PS SZ 2.5 R (Femur) ×2 IMPLANT
GLOVE BIO SURGEON STRL SZ7.5 (GLOVE) ×6 IMPLANT
GLOVE BIOGEL M STRL SZ7.5 (GLOVE) ×6 IMPLANT
GLOVE BIOGEL PI IND STRL 7.5 (GLOVE) ×1 IMPLANT
GLOVE BIOGEL PI INDICATOR 7.5 (GLOVE) ×2
GLOVE INDICATOR 8.0 STRL GRN (GLOVE) ×3 IMPLANT
GOWN STRL REUS W/ TWL LRG LVL3 (GOWN DISPOSABLE) ×2 IMPLANT
GOWN STRL REUS W/ TWL XL LVL3 (GOWN DISPOSABLE) ×1 IMPLANT
GOWN STRL REUS W/TWL LRG LVL3 (GOWN DISPOSABLE) ×4
GOWN STRL REUS W/TWL XL LVL3 (GOWN DISPOSABLE) ×2
HEMOVAC 400CC 10FR (MISCELLANEOUS) ×3 IMPLANT
HOLDER FOLEY CATH W/STRAP (MISCELLANEOUS) ×3 IMPLANT
HOOD PEEL AWAY FLYTE STAYCOOL (MISCELLANEOUS) ×6 IMPLANT
KIT TURNOVER KIT A (KITS) ×3 IMPLANT
KNIFE SCULPS 14X20 (INSTRUMENTS) ×3 IMPLANT
LABEL OR SOLS (LABEL) ×3 IMPLANT
MANIFOLD NEPTUNE II (INSTRUMENTS) ×3 IMPLANT
NDL SAFETY ECLIPSE 18X1.5 (NEEDLE) ×1 IMPLANT
NDL SPNL 20GX3.5 QUINCKE YW (NEEDLE) ×2 IMPLANT
NEEDLE HYPO 18GX1.5 SHARP (NEEDLE) ×2
NEEDLE SPNL 20GX3.5 QUINCKE YW (NEEDLE) ×6 IMPLANT
NS IRRIG 500ML POUR BTL (IV SOLUTION) ×3 IMPLANT
PACK TOTAL KNEE (MISCELLANEOUS) ×3 IMPLANT
PAD COLD SHLDR UNI WRAP-ON (PAD) ×3
PAD COLD UNI WRAP-ON (PAD) IMPLANT
PAD WRAPON POLAR KNEE (MISCELLANEOUS) ×1 IMPLANT
PATELLA DOME PFC 35MM (Knees) ×2 IMPLANT
PENCIL SMOKE ULTRAEVAC 22 CON (MISCELLANEOUS) ×3 IMPLANT
PIN DRILL QUICK PACK ×3 IMPLANT
PIN FIXATION 1/8DIA X 3INL (PIN) ×9 IMPLANT
PLATE ROT INSERT 10MM SIZE 2.5 (Plate) ×2 IMPLANT
PULSAVAC PLUS IRRIG FAN TIP (DISPOSABLE) ×3
SOL .9 NS 3000ML IRR  AL (IV SOLUTION) ×2
SOL .9 NS 3000ML IRR UROMATIC (IV SOLUTION) ×1 IMPLANT
SOL PREP PVP 2OZ (MISCELLANEOUS) ×3
SOLUTION PREP PVP 2OZ (MISCELLANEOUS) ×1 IMPLANT
SPONGE DRAIN TRACH 4X4 STRL 2S (GAUZE/BANDAGES/DRESSINGS) ×3 IMPLANT
STAPLER SKIN PROX 35W (STAPLE) ×3 IMPLANT
STOCKINETTE IMPERV 14X48 (MISCELLANEOUS) IMPLANT
STRAP TIBIA SHORT (MISCELLANEOUS) ×3 IMPLANT
SUCTION FRAZIER HANDLE 10FR (MISCELLANEOUS) ×2
SUCTION TUBE FRAZIER 10FR DISP (MISCELLANEOUS) ×1 IMPLANT
SUT VIC AB 0 CT1 36 (SUTURE) ×3 IMPLANT
SUT VIC AB 1 CT1 36 (SUTURE) ×6 IMPLANT
SUT VIC AB 2-0 CT2 27 (SUTURE) ×3 IMPLANT
SYR 20ML LL LF (SYRINGE) ×3 IMPLANT
SYR 30ML LL (SYRINGE) ×6 IMPLANT
TIBIA MBT CEMENT SIZE 2.5 (Knees) ×3 IMPLANT
TIP FAN IRRIG PULSAVAC PLUS (DISPOSABLE) ×1 IMPLANT
TOWEL OR 17X26 4PK STRL BLUE (TOWEL DISPOSABLE) ×3 IMPLANT
TOWER CARTRIDGE SMART MIX (DISPOSABLE) ×3 IMPLANT
TRAY FOLEY MTR SLVR 16FR STAT (SET/KITS/TRAYS/PACK) ×3 IMPLANT
WRAPON POLAR PAD KNEE (MISCELLANEOUS)

## 2018-12-23 NOTE — Anesthesia Post-op Follow-up Note (Signed)
Anesthesia QCDR form completed.        

## 2018-12-23 NOTE — Anesthesia Preprocedure Evaluation (Addendum)
Anesthesia Evaluation  Patient identified by MRN, date of birth, ID band Patient awake    Reviewed: Allergy & Precautions, H&P , NPO status , Patient's Chart, lab work & pertinent test results  History of Anesthesia Complications (+) PONV and history of anesthetic complications  Airway Mallampati: II  TM Distance: >3 FB     Dental  (+) Upper Dentures   Pulmonary shortness of breath, COPD,  COPD inhaler, former smoker,           Cardiovascular (-) angina(-) Past MI, (-) Cardiac Stents and (-) CABG negative cardio ROS  (-) dysrhythmias      Neuro/Psych negative neurological ROS  negative psych ROS   GI/Hepatic Neg liver ROS, hiatal hernia, GERD  Controlled,  Endo/Other  Hypothyroidism   Renal/GU   negative genitourinary   Musculoskeletal   Abdominal   Peds  Hematology negative hematology ROS (+)   Anesthesia Other Findings Past Medical History: 06/03/2014: Adnexal mass     Comment:  since 2009 No date: Arthritis No date: Complication of anesthesia 01/2007: Diffuse large cell lymphoma in remission ([place])     Comment:  NON-HODGKINS-stage 3, cd 20 positive; status post 6               cycles of R-CHOP No date: Dyspnea     Comment:  with exertion No date: GERD (gastroesophageal reflux disease) 01/31/2009: Herpes zoster without complication No date: History of hiatal hernia No date: History of kidney stones No date: HOH (hard of hearing)     Comment:  wears hearing aides No date: Non Hodgkin's lymphoma ([place]) No date: Personal history of chemotherapy No date: PONV (postoperative nausea and vomiting)     Comment:  in January lasted about 4 hours No date: Recurrent UTI  Past Surgical History: 2009: ABDOMINAL SURGERY     Comment:  abdominal mass+ [place] lymphoma, 01/2017: BACK SURGERY     Comment:  fusion. metal plate in neck at back 1997: CATARACT EXTRACTION     Comment:  right eye and left ey 1995: cervical neck  fusion 01/30/2018: CYSTOSCOPY W/ RETROGRADES; Left     Comment:  Procedure: CYSTOSCOPY WITH RETROGRADE PYELOGRAM;                Surgeon: Billey Co, MD;  Location: [facility];                Service: Urology;  Laterality: Left; 08/05/2018: CYSTOSCOPY W/ RETROGRADES; Left     Comment:  Procedure: CYSTOSCOPY WITH RETROGRADE PYELOGRAM;                Surgeon: Billey Co, MD;  Location: [facility];                Service: Urology;  Laterality: Left; 08/05/2018: CYSTOSCOPY W/ URETERAL STENT PLACEMENT; Left     Comment:  Procedure: CYSTOSCOPY WITH STENT Exchange;  Surgeon:               Billey Co, MD;  Location: [facility];  Service:               Urology;  Laterality: Left; 02/27/2018: CYSTOSCOPY WITH BIOPSY; Left     Comment:  Procedure: CYSTOSCOPY WITH BIOPSY;  Surgeon: Billey Co, MD;  Location: [facility];  Service: Urology;                Laterality: Left; 01/30/2018: CYSTOSCOPY WITH STENT  PLACEMENT; Left     Comment:  Procedure: CYSTOSCOPY WITH STENT PLACEMENT;  Surgeon:               Billey Co, MD;  Location: [facility];  Service:               Urology;  Laterality: Left; 02/27/2018: CYSTOSCOPY WITH URETEROSCOPY AND STENT PLACEMENT; Left     Comment:  Procedure: CYSTOSCOPY WITH URETEROSCOPY AND STENT               Exchange;  Surgeon: Billey Co, MD;  Location: [facility]              ORS;  Service: Urology;  Laterality: Left; No date: DENTAL SURGERY     Comment:  screws No date: JOINT REPLACEMENT; Left     Comment:  knee 02/21/2017: KYPHOPLASTY; N/A     Comment:  Procedure: [id number];  Surgeon: Hessie Knows, MD;               Location: [facility];  Service: Orthopedics;  Laterality:               N/A; 03/02/2007: laparotomy with biopsy 2009: [place]: SPINE SURGERY No date: SQUAMOUS CELL CARCINOMA EXCISION     Comment:  right arm No date: TOTAL KNEE ARTHROPLASTY; Left     Comment:  2009 01/30/2018: URETEROSCOPY; Left     Comment:   Procedure: URETEROSCOPY;  Surgeon: Billey Co, MD;              Location: [facility];  Service: Urology;  Laterality: Left; 1971: VAGINAL HYSTERECTOMY     Reproductive/Obstetrics negative OB ROS                            Anesthesia Physical Anesthesia Plan  ASA: II  Anesthesia Plan: Spinal   Post-op Pain Management:    Induction:   PONV Risk Score and Plan: Propofol infusion  Airway Management Planned: Natural Airway and Simple Face Mask  Additional Equipment:   Intra-op Plan:   Post-operative Plan:   Informed Consent: I have reviewed the patients History and Physical, chart, labs and discussed the procedure including the risks, benefits and alternatives for the proposed anesthesia with the patient or authorized representative who has indicated his/her understanding and acceptance.     Dental Advisory Given  Plan Discussed with: Anesthesiologist  Anesthesia Plan Comments:        Anesthesia Quick Evaluation

## 2018-12-23 NOTE — Transfer of Care (Signed)
Immediate Anesthesia Transfer of Care Note  Patient: [place]  Procedure(s) Performed: RIGHT COMPUTER ASSISTED TOTAL KNEE ARTHROPLASTY (Right Knee)  Patient Location: PACU  Anesthesia Type:Spinal  Level of Consciousness: awake, alert  and oriented  Airway & Oxygen Therapy: Patient Spontanous Breathing and Patient connected to nasal cannula oxygen  Post-op Assessment: Report given to RN and Post -op Vital signs reviewed and stable  Post vital signs: Reviewed and stable  Last Vitals:  Vitals Value Taken Time  BP 113/71 12/23/18 1515  Temp    Pulse 85 12/23/18 1516  Resp 21 12/23/18 1516  SpO2 99 % 12/23/18 1516  Vitals shown include unvalidated device data.  Last Pain:  Vitals:   12/23/18 1021  TempSrc: Tympanic  PainSc: 0-No pain         Complications: No apparent anesthesia complications

## 2018-12-23 NOTE — H&P (Signed)
ORTHOPAEDIC HISTORY & PHYSICAL  Progress Notes by Gwenlyn Fudge, PA at 12/15/2018 2:45 PM  [place] [facility] Chief Complaint:       Chief Complaint  Patient presents with  . Knee Pain    H & P RIGHT KNEE    History of Present Illness:    [place] Monica Singh is a 85 y.o. female that presents to clinic today for her preoperative history and evaluation.  Patient presents with her daughter. The patient is scheduled to undergo a right total knee arthroplasty on 12/23/18 by Dr. Marry Guan. Her pain began 8 months ago.  The pain is located primarily along the medial aspect of the knee.She reports associated increase in pain with weightbearing, as well as start up stiffness after prolonged sitting.  She denies associated numbness or tingling.   The patient's symptoms have progressed to the point that they decrease her quality of life. The patient has previously undergone conservative treatment including NSAIDS and injections to the knee without adequate control of her symptoms.  Patient denies any history of blood clots or significant cardiac history.    Past Medical, Surgical, Family, Social History, Allergies, Medications:   Past Medical History:      Past Medical History:  Diagnosis Date  . Non Hodgkin's lymphoma (CMS-HCC)   . Osteoporosis   . Shingles   . Squamous cell cancer of skin of upper arm, left     Past Surgical History:       Past Surgical History:  Procedure Laterality Date  . back surgery     cement placed  . CATARACT EXTRACTION Left   . CYSTOURETHROSCOPY W/INSERTION URETHRAL STENT    . HYSTERECTOMY VAGINAL    . KYPHOPLASTY  02/21/2017   L1 Dr.Menz  . Left total knee arthroplasty usin computer-assisted navigation  10/08/2007   Dr Marry Guan  . Neck surgery    . Squamous cell cancer removed from left arm  03/2009    Current Medications:  Current Medications        Current Outpatient  Medications  Medication Sig Dispense Refill  . albuterol 90 mcg/actuation inhaler Inhale 2 inhalations into the lungs every 6 (six) hours as needed for Wheezing    . ibuprofen (MOTRIN) 200 MG tablet Take 400 mg by mouth once daily as needed for Pain    . ipratropium-albuterol (COMBIVENT RESPIMAT) 20-100 mcg/actuation inhaler Inhale into the lungs 4 (four) times daily    . risedronate (ACTONEL) 35 MG tablet 35 mg every 7 (seven) days        No current facility-administered medications for this visit.       Allergies:      Allergies  Allergen Reactions  . Sulfa (Sulfonamide Antibiotics) Hives and Itching  . Ciprofloxacin Itching  . Milk Diarrhea and Nausea And Vomiting    Social History:  Social History  Social History        Socioeconomic History  . Marital status: Widowed    Spouse name: Not on file  . Number of children: 5  . Years of education: 14+  . Highest education level: Not on file  Occupational History  . Occupation: Retired  Scientific laboratory technician  . Financial resource strain: Not on file  . Food insecurity    Worry: Not on file    Inability: Not on file  . Transportation needs    Medical: Not on file    Non-medical: Not on file  Tobacco Use  . Smoking status: Former Smoker  Packs/day: 1.00    Years: 40.00    Pack years: 40.00    Quit date: 03/29/1989    Years since quitting: 29.7  . Smokeless tobacco: Never Used  Substance and Sexual Activity  . Alcohol use: Yes    Alcohol/week: 7.0 standard drinks    Types: 7 Glasses of wine per week    Frequency: 4 or more times a week  . Drug use: No  . Sexual activity: Defer    Partners: Male  Lifestyle  . Physical activity    Days per week: Not on file    Minutes per session: Not on file  . Stress: Not on file  Relationships  . Social Herbalist on phone: Not on file    Gets together: Not on file    Attends religious service: Not on file    Active  member of club or organization: Not on file    Attends meetings of clubs or organizations: Not on file    Relationship status: Not on file  Other Topics Concern  . Not on file  Social History Narrative  . Not on file      Family History: History reviewed. No pertinent family history.  Review of Systems:   A 10+ ROS was performed, reviewed, and the pertinent orthopaedic findings are documented in the HPI.    Physical Examination:   BP 130/80   Ht 157.5 cm (5\' 2" )   Wt 62.8 kg (138 lb 6.4 oz)   BMI 25.31 kg/m   Patient is a well-developed, well-nourished female in no acute distress. Patient has normal mood and affect. Patient is alert and oriented to person, place, and time.   HEENT: Atraumatic, normocephalic.  Pupils equal and reactive to light.  Extraocular motion intact.  Noninjected sclera.  Cardiovascular: Regular rate and rhythm, with no murmurs, rubs, or gallops.  Distal pulses palpable.  Respiratory: Lungs clear to auscultation bilaterally.    RightKnee: Soft tissue swelling:minimal Effusion:none Erythema:none Crepitance:minimal Tenderness:medial Alignment:relative varus Mediolateral laxity:medial pseudolaxity Posterior [id number] Patellar tracking:Good tracking without evidence of subluxation or tilt Atrophy:No significantatrophy.  Quadriceps tone was fair to good. Range of motion:0/0/131degrees  Sensation intact over the saphenous, lateral sural cutaneous, superficial fibular, and deep fibular nerve distributions.  Tests Performed/Reviewed:  X-rays  Anteroposterior, lateral, and sunrise views of the right knee were obtained.  Images reveal moderate to severe loss of medial compartment joint space with subchondral sclerosis of the bone and  osteophyte formation.  Lateral compartment reveals significant chondrocalcinosis.  Patellofemoral joint remains relatively well-preserved with minimal osteophyte formation.   Impression:     ICD-10-CM  1. Chronic pain of right knee  M25.561   G89.29  2. Primary osteoarthritis of right knee  M17.11     Plan:   The patient has end-stage degenerative changes of the right knee.  It was explained to the patient that the condition is progressive in nature.  Having failed conservative treatment, the patient has elected to proceed with a total joint arthroplasty.  The patient will undergo a total joint arthroplasty with Dr. Marry Guan.  The risks of surgery, including blood clot and infection, were discussed with the patient.  Measures to reduce these risks, including the use of anticoagulation, perioperative antibiotics, and early ambulation were discussed.  The importance of postoperative physical therapy was discussed with the patient. The patient elects to proceed with surgery. The patient is instructed to stop all blood thinners prior to surgery.  The patient is instructed to  call the hospital the day before surgery to learn of the proper arrival time.    Contact our office with any questions or concerns.  Follow up as indicated, or sooner should any new problems arise, if conditions worsen, or if they are otherwise concerned.   Gwenlyn Fudge, PA [facility] [address], [place] [address] Phone: [phone]  This note was generated in part with voice recognition software and I apologize for any typographical errors that were not detected and corrected.     Electronically signed by Gwenlyn Fudge, [place] on 12/15/2018 7:21 PM

## 2018-12-23 NOTE — H&P (Signed)
The patient has been re-examined, and the chart reviewed, and there have been no interval changes to the documented history and physical.    The risks, benefits, and alternatives have been discussed at length. The patient expressed understanding of the risks benefits and agreed with plans for surgical intervention.  James P. Hooten, Jr. M.D.    

## 2018-12-23 NOTE — Anesthesia Procedure Notes (Signed)
Spinal  Patient location during procedure: OR Start time: 12/23/2018 11:54 AM End time: 12/23/2018 12:04 PM Staffing Resident/CRNA: Bernardo Heater, CRNA Performed: resident/CRNA  Preanesthetic Checklist Completed: patient identified, site marked, surgical consent, pre-op evaluation, timeout performed, IV checked, risks and benefits discussed and monitors and equipment checked Spinal Block Patient position: sitting Prep: ChloraPrep Patient monitoring: heart rate, continuous pulse ox, blood pressure and cardiac monitor Approach: midline Location: L4-5 Injection technique: single-shot Needle Needle type: Introducer and Pencan  Needle gauge: 24 G Needle length: 9 cm Additional Notes Negative paresthesia. Negative blood return. Positive free-flowing CSF. Expiration date of kit checked and confirmed. Patient tolerated procedure well, without complications.

## 2018-12-23 NOTE — Op Note (Signed)
OPERATIVE NOTE  DATE OF SURGERY:  12/23/2018  PATIENT NAME:  Monica Singh   DOB: 1928-11-14  MRN: [id number]  PRE-OPERATIVE DIAGNOSIS: Degenerative arthrosis of the right knee, primary  POST-OPERATIVE DIAGNOSIS:  Same  PROCEDURE:  Right total knee arthroplasty using computer-assisted navigation  SURGEON:  Marciano Sequin. M.D.  ASSISTANT: Cassell Smiles, [name] (present and scrubbed throughout the case, critical for assistance with exposure, retraction, instrumentation, and closure)  ANESTHESIA: spinal  ESTIMATED BLOOD LOSS: 50 mL  FLUIDS REPLACED: 1000 mL of crystalloid  TOURNIQUET TIME: 81 minutes  DRAINS: 2 medium Hemovac drains  SOFT TISSUE RELEASES: Anterior cruciate ligament, posterior cruciate ligament, deep medial collateral ligament, patellofemoral ligament  IMPLANTS UTILIZED: DePuy PFC Sigma size 2.5 posterior stabilized femoral component (cemented), size 2.5 MBT tibial component (cemented), 35 mm 3 peg oval dome patella (cemented), and a 10 mm stabilized rotating platform polyethylene insert.  INDICATIONS FOR SURGERY: Monica Singh is a 85 y.o. year old female with a long history of progressive knee pain. X-rays demonstrated severe degenerative changes in tricompartmental fashion. The patient had not seen any significant improvement despite conservative nonsurgical intervention. After discussion of the risks and benefits of surgical intervention, the patient expressed understanding of the risks benefits and agree with plans for total knee arthroplasty.   The risks, benefits, and alternatives were discussed at length including but not limited to the risks of infection, bleeding, nerve injury, stiffness, blood clots, the need for revision surgery, cardiopulmonary complications, among others, and they were willing to proceed.  PROCEDURE IN DETAIL: The patient was brought into the operating room and, after adequate spinal anesthesia was achieved, a tourniquet was  placed on the patient's upper thigh. The patient's knee and leg were cleaned and prepped with alcohol and DuraPrep and draped in the usual sterile fashion. A "timeout" was performed as per usual protocol. The lower extremity was exsanguinated using an Esmarch, and the tourniquet was inflated to 300 mmHg. An anterior longitudinal incision was made followed by a standard mid vastus approach. The deep fibers of the medial collateral ligament were elevated in a subperiosteal fashion off of the medial flare of the tibia so as to maintain a continuous soft tissue sleeve. The patella was subluxed laterally and the patellofemoral ligament was incised. Inspection of the knee demonstrated severe degenerative changes with full-thickness loss of articular cartilage. Osteophytes were debrided using a rongeur. Anterior and posterior cruciate ligaments were excised. Two 4.0 mm Schanz pins were inserted in the femur and into the tibia for attachment of the array of trackers used for computer-assisted navigation. Hip center was identified using a circumduction technique. Distal landmarks were mapped using the computer. The distal femur and proximal tibia were mapped using the computer. The distal femoral cutting guide was positioned using computer-assisted navigation so as to achieve a 5 distal valgus cut. The femur was sized and it was felt that a size 2.5 femoral component was appropriate. A size 2.5 femoral cutting guide was positioned and the anterior cut was performed and verified using the computer. This was followed by completion of the posterior and chamfer cuts. Femoral cutting guide for the central box was then positioned in the center box cut was performed.  Attention was then directed to the proximal tibia. Medial and lateral menisci were excised. The extramedullary tibial cutting guide was positioned using computer-assisted navigation so as to achieve a 0 varus-valgus alignment and 0 posterior slope. The cut was  performed and verified using the computer. The proximal  tibia was sized and it was felt that a size 2.5 tibial tray was appropriate. Tibial and femoral trials were inserted followed by insertion of a 10 mm polyethylene insert. This allowed for excellent mediolateral soft tissue balancing both in flexion and in full extension. Finally, the patella was cut and prepared so as to accommodate a 35 mm 3 peg oval dome patella. A patella trial was placed and the knee was placed through a range of motion with excellent patellar tracking appreciated. The femoral trial was removed after debridement of posterior osteophytes. The central post-hole for the tibial component was reamed followed by insertion of a keel punch. Tibial trials were then removed. Cut surfaces of bone were irrigated with copious amounts of normal saline with antibiotic solution using pulsatile lavage and then suctioned dry. Polymethylmethacrylate cement was prepared in the usual fashion using a vacuum mixer. Cement was applied to the cut surface of the proximal tibia as well as along the undersurface of a size 2.5 MBT tibial component. Tibial component was positioned and impacted into place. Excess cement was removed using [employer] fast streamer. Cement was then applied to the cut surfaces of the femur as well as along the posterior flanges of the size 2.5 femoral component. The femoral component was positioned and impacted into place. Excess cement was removed using [employer] fast streamer. A 10 mm polyethylene trial was inserted and the knee was brought into full extension with steady axial compression applied. Finally, cement was applied to the backside of a 35 mm 3 peg oval dome patella and the patellar component was positioned and patellar clamp applied. Excess cement was removed using [employer] fast streamer. After adequate curing of the cement, the tourniquet was deflated after a total tourniquet time of 81 minutes. Hemostasis was achieved using electrocautery. The knee  was irrigated with copious amounts of normal saline with antibiotic solution using pulsatile lavage and then suctioned dry. 20 mL of 1.3% Exparel and 60 mL of 0.25% Marcaine in 40 mL of normal saline was injected along the posterior capsule, medial and lateral gutters, and along the arthrotomy site. A 10 mm stabilized rotating platform polyethylene insert was inserted and the knee was placed through a range of motion with excellent mediolateral soft tissue balancing appreciated and excellent patellar tracking noted. 2 medium drains were placed in the wound bed and brought out through separate stab incisions. The medial parapatellar portion of the incision was reapproximated using interrupted sutures of #1 Vicryl. Subcutaneous tissue was approximated in layers using first #0 Vicryl followed #2-0 Vicryl. The skin was approximated with skin staples. A sterile dressing was applied.  The patient tolerated the procedure well and was transported to the recovery room in stable condition.    Beauden [place] P. Holley Bouche., M.D.

## 2018-12-24 ENCOUNTER — Encounter: Payer: Self-pay | Admitting: Orthopedic Surgery

## 2018-12-24 MED ORDER — OXYCODONE HCL 5 MG PO TABS: 5.0000 mg | ORAL_TABLET | ORAL | 0 refills | Status: DC | PRN

## 2018-12-24 MED ORDER — TRAMADOL HCL 50 MG PO TABS: 50.0000 mg | ORAL_TABLET | Freq: Four times a day (QID) | ORAL | 0 refills | Status: DC | PRN

## 2018-12-24 MED ORDER — IPRATROPIUM-ALBUTEROL 0.5-2.5 (3) MG/3ML IN SOLN: 3.0000 mL | RESPIRATORY_TRACT | Status: DC | PRN

## 2018-12-24 MED ORDER — ENOXAPARIN SODIUM 40 MG/0.4ML ~~LOC~~ SOLN: 40.0000 mg | SUBCUTANEOUS | 0 refills | Status: DC

## 2018-12-24 NOTE — TOC Initial Note (Signed)
Transition of Care [facility]) - Initial/Assessment Note    Patient Details  Name: Monica Singh MRN: [id number] Date of Birth: 11-09-1928  Transition of Care [facility]) CM/SW Contact:    Shelbie Hutching, RN Phone Number: 12/24/2018, 11:14 AM  Clinical Narrative:                 Patient admitted after total knee replacement.  Plan for discharge tomorrow.  Patient lives alone in a [place].  Patient's daughter [name] will be staying with her and helping out and another daughter Jeani Hawking will be coming to visit next week and staying for a week.  Patient has a rolling walker and at this time does not want a 3 in 1.  Patient chooses [facility] for home health services.  Drue Novel with [facility] has accepted referral for home health services.    Expected Discharge Plan: [facility] Barriers to Discharge: Continued Medical Work up   Patient Goals and CMS Choice Patient states their goals for this hospitalization and ongoing recovery are:: get up and be able to walk CMS Medicare.gov Compare Post Acute Care list provided to:: Patient Choice offered to / list presented to : Patient  Expected Discharge Plan and Services Expected Discharge Plan: [place]   Discharge Planning Services: CM Consult Post Acute Care Choice: [place] arrangements for the past 2 months: [place] Arranged: PT, OT   Date HH Agency Contacted: 12/24/18 Time [place]: 11 Representative spoke with at [place]: Drue Novel  Prior Living Arrangements/Services Living arrangements for the past 2 months: [place] with:: Self Patient language and need for interpreter reviewed:: Yes Do you feel safe going back to the place where you live?: Yes      Need for Family Participation in Patient Care: Yes (Comment)(knee replacement) Care giver support system in place?: Yes (comment) Current home services: DME(rolling  walker) Criminal Activity/Legal Involvement Pertinent to Current Situation/Hospitalization: No - Comment as needed  Activities of Daily Living Home Assistive Devices/Equipment: Dentures (specify type), Eyeglasses, Hearing aid ADL Screening (condition at time of admission) Patient's cognitive ability adequate to safely complete daily activities?: Yes Is the patient deaf or have difficulty hearing?: Yes Does the patient have difficulty seeing, even when wearing glasses/contacts?: No Does the patient have difficulty concentrating, remembering, or making decisions?: No Patient able to express need for assistance with ADLs?: Yes Does the patient have difficulty dressing or bathing?: No Independently performs ADLs?: Yes (appropriate for developmental age) Does the patient have difficulty walking or climbing stairs?: No Weakness of Legs: Right Weakness of Arms/Hands: None  Permission Sought/Granted Permission sought to share information with : Case Manager, Family Supports, Other (comment) Permission granted to share information with : Yes, Verbal Permission Granted     Permission granted to share info w AGENCY: [place] granted to share info w Relationship: daughters     Emotional Assessment Appearance:: Appears stated age Attitude/Demeanor/Rapport: Engaged Affect (typically observed): Accepting Orientation: : Oriented to Self, Oriented to Place, Oriented to  Time, Oriented to Situation Alcohol / Substance Use: Not Applicable Psych Involvement: No (comment)  Admission diagnosis:  PRIMARY OSTEOARTHRITIS RIGHT KNEE Patient Active Problem List   Diagnosis Date Noted  . Total knee replacement status 12/23/2018  . Obstruction of left ureteropelvic  junction (UPJ) 02/26/2018  . Sepsis ([place]) 02/10/2018  . Closed burst fracture of lumbar vertebra with routine healing 04/01/2017  . Chronic bronchitis ([place]) 02/18/2017  . Osteoporosis 11/20/2015  . COPD (chronic  obstructive pulmonary disease) ([place]) 11/20/2015  . Cyst of ovary 07/26/2015  . Status post total left knee replacement 04/03/2015  . Abnormal chest sounds 08/25/2014  . Allergic rhinitis 08/25/2014  . Colon polyp 08/25/2014  . DDD (degenerative disc disease), cervical 08/25/2014  . DDD (degenerative disc disease), lumbar 08/25/2014  . H/O non-Hodgkin's lymphoma 08/25/2014  . Below normal amount of sodium in the blood 08/25/2014  . Primary osteoarthritis of right knee 08/25/2014  . Subclinical hypothyroidism 08/25/2014  . Adnexal mass 06/03/2014  . 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 History of smoking 03/11/2008   PCP:  Birdie Sons, MD Pharmacy:   [place], [place] - [place] [address] [place] [address] Phone: [phone] Fax: [phone]  [place], [place] - [place] [address] Phone: [phone] Fax: [phone]     Social Determinants of Health (SDOH) Interventions    Readmission Risk Interventions No flowsheet data found.

## 2018-12-24 NOTE — Progress Notes (Signed)
Subjective: 1 Day Post-Op Procedure(s) (LRB): RIGHT COMPUTER ASSISTED TOTAL KNEE ARTHROPLASTY (Right) Patient reports pain as mild.   Patient is well, and has had no acute complaints or problems PT and care management to assist with discharge planning. Negative for chest pain and shortness of breath Fever: no Gastrointestinal:Negative for nausea and vomiting  Objective: Vital signs in last 24 hours: Temp:  [96.5 F (35.8 C)-98 F (36.7 C)] 97.6 F (36.4 C) (11/26 0802) Pulse Rate:  [79-88] 79 (11/26 0802) Resp:  [15-28] 17 (11/26 0802) BP: (113-146)/(61-100) 125/66 (11/26 0802) SpO2:  [94 %-100 %] 98 % (11/26 0802)  Intake/Output from previous day:  Intake/Output Summary (Last 24 hours) at 12/24/2018 0840 Last data filed at 12/24/2018 0614 Gross per 24 hour  Intake 3068.29 ml  Output 1715 ml  Net 1353.29 ml    Intake/Output this shift: No intake/output data recorded.  Labs: No results for input(s): HGB in the last 72 hours. No results for input(s): WBC, RBC, HCT, PLT in the last 72 hours. No results for input(s): NA, K, CL, CO2, BUN, CREATININE, GLUCOSE, CALCIUM in the last 72 hours. No results for input(s): LABPT, INR in the last 72 hours.   EXAM General - Patient is Alert, Appropriate and Oriented Extremity - ABD soft Sensation intact distally Dorsiflexion/Plantar flexion intact Incision: bulky dressing intact with hemovac in place. No cellulitis present Dressing/Incision - Bulky dressing intact to the right leg.  Hemovac with minimal bloody drainage. Motor Function - intact, moving foot and toes well on exam.  Negative [place] to the right leg.  Past Medical History:  Diagnosis Date  . Adnexal mass 06/03/2014   since 2009  . Arthritis   . Complication of anesthesia   . 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 Dyspnea    with exertion  . GERD (gastroesophageal reflux  disease)   . Herpes zoster without complication [id number]  . History of hiatal hernia   . History of kidney stones   . HOH (hard of hearing)    wears hearing aides  . Non Hodgkin's lymphoma ([place])   . Personal history of chemotherapy   . PONV (postoperative nausea and vomiting)    in January lasted about 4 hours  . Recurrent UTI     Assessment/Plan: 1 Day Post-Op Procedure(s) (LRB): RIGHT COMPUTER ASSISTED TOTAL KNEE ARTHROPLASTY (Right) Active Problems:   Total knee replacement status  Estimated body mass index is 25.32 kg/m as calculated from the following:   Height as of this encounter: 5\' 2"  (1.575 m).   Weight as of 12/16/18: 62.8 kg. Advance diet Up with therapy D/C IV fluids when tolerating po intake.  Patient is passing gas without pain.  Continue to work on [employer] Up with therapy today. Plan for bulky dressing and hemovac removal tomorrow. Plan for possible d/c home tomorrow.  DVT Prophylaxis - Lovenox, Foot Pumps and TED hose Weight-Bearing as tolerated to right leg  J. Cameron Proud, [name] [facility] 12/24/2018, 8:40 AM

## 2018-12-24 NOTE — Anesthesia Postprocedure Evaluation (Signed)
Anesthesia Post Note  Patient: [employer]  Procedure(s) Performed: RIGHT COMPUTER ASSISTED TOTAL KNEE ARTHROPLASTY (Right Knee)  Patient location during evaluation: Nursing Unit Anesthesia Type: Spinal Level of consciousness: oriented and awake and alert Pain management: pain level controlled Vital Signs Assessment: post-procedure vital signs reviewed and stable Respiratory status: spontaneous breathing, respiratory function stable and patient connected to nasal cannula oxygen Cardiovascular status: blood pressure returned to baseline and stable Postop Assessment: no headache, no backache and no apparent nausea or vomiting Anesthetic complications: no     Last Vitals:  Vitals:   12/24/18 0802 12/24/18 1151  BP: 125/66 (!) 131/59  Pulse: 79 88  Resp: 17   Temp: 36.4 C (!) 36.4 C  SpO2: 98% 97%    Last Pain:  Vitals:   12/24/18 1151  TempSrc: Oral  PainSc:                  Estill Batten

## 2018-12-24 NOTE — Evaluation (Signed)
Physical Therapy Evaluation Patient Details Name: Monica Singh MRN: [id number] DOB: 1928-04-08 Today's Date: 12/24/2018   History of Present Illness  Monica Singh is a 85 y.o. female who was admitted to the [facility] for R right total knee arthroplasty performed on 12/23/18 by Dr. Esmond Harps to degenerative arthrosis of right knee. 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    Clinical Impression  Patient alert and oriented x 4 and daughter Monica Singh present to confirm history. Patient lives alone in a first floor condo with no steps to enter, has a walk in shower with built in seat, and RW at home. Prior to hospitalization, she was I with all ADLs and had some help with cleaning and getting groceries. She was I with ambulation using a RW at times. Her daughters have arranged to stay with her 24/7 for the first two weeks following discharge. Upon initial physical therapy evaluation, patient required min A for bed mobility and sit <> stand transfers using RW and ambulated approx 150 feet with RW and CGA. She demo antalgic gait favoring R LE but improved heel strike and step length with cuing. Also required cuing for forward shift and proper use of UEs and body/AD positioning during transfers. R knee AAROM 0 - 10 -90 degrees. Educated on HEP and provided handout. Patient appears to have experienced a decline in functional mobility/independence and would benefit  From physical therapy to address remaining impairments and functional limitations (see PT Problem List below) to work towards stated goals and return to PLOF or maximal functional independence.      Follow Up Recommendations Home health PT;Supervision/Assistance - 24 hour    Equipment Recommendations  3in1 (PT)    Recommendations for Other Services OT consult     Precautions / Restrictions  Precautions Precautions: Fall Restrictions Weight Bearing Restrictions: Yes RLE Weight Bearing: Weight bearing as tolerated      Mobility  Bed Mobility Overal bed mobility: Needs Assistance Bed Mobility: Supine to Sit     Supine to sit: Min assist     General bed mobility comments: supine to sit with min A to help stablize trunk  Transfers Overall transfer level: Needs assistance Equipment used: Rolling walker (2 wheeled) Transfers: Sit to/from Stand Sit to Stand: Min assist;Min guard         General transfer comment: sit <> stand from/to bed, from/to BSC over toilet, bed to chair with min A progressing to CGA and cuing for forward shift, hand placement, safe aligment of RW.  Ambulation/Gait Ambulation/Gait assistance: Min guard Gait Distance (Feet): 150 Feet(plus 15 feet) Assistive device: Rolling walker (2 wheeled) Gait Pattern/deviations: Decreased step length - left;Decreased stance time - right;Antalgic;Trunk flexed Gait velocity: very slow   General Gait Details: Patient ambulated approx 10 feet then 150 feet with RW and CGA for safety. Required cuing for safe handling of RW including keeping close to body throughout turns and while backing up. Cued for improved heel strike, step length. Patient reports pain when weight bearing on R LE.  Stairs            Wheelchair Mobility    Modified Rankin (Stroke Patients Only)       Balance Overall balance assessment: Needs assistance Sitting-balance support: Feet supported Sitting balance-Leahy Scale: Good Sitting balance - Comments: steady sitting on bed and BSC   Standing balance support: Bilateral upper extremity supported;During functional activity Standing  balance-Leahy Scale: Poor Standing balance comment: dependent on RW for support                             Pertinent Vitals/Pain Pain Assessment: 0-10 Pain Score: 8  Pain Location: rated 8/10 during ambulation, 0/10 at rest prior to  session. Pain Descriptors / Indicators: Grimacing Pain Intervention(s): Limited activity within patient's tolerance;Monitored during session;Repositioned;Ice applied    Home Living Family/patient expects to be discharged to:: Private residence Living Arrangements: Alone Available Help at Discharge: Family;Available 24 hours/day(availalble 24 hours a day for 2 weeks) Type of Home: House(condo) Home Access: Level entry     Home Layout: One level Home Equipment: [place] - 2 wheels;Shower seat - built in      Prior Function Level of Independence: Independent with assistive device(s)         Comments: Reports independent with ambulation with intermittant use of walker. Was I with ADLs and had help with getting groceries and cleaning her home.     Hand Dominance        Extremity/Trunk Assessment   Upper Extremity Assessment Upper Extremity Assessment: Generalized weakness(fatigues quickly)    Lower Extremity Assessment Lower Extremity Assessment: Generalized weakness;RLE deficits/detail(fatigues quickly) RLE Deficits / Details: limited ROM, pain s/p R TKA    Cervical / Trunk Assessment Cervical / Trunk Assessment: Normal  Communication   Communication: No difficulties  Cognition Arousal/Alertness: Awake/alert Behavior During Therapy: WFL for tasks assessed/performed Overall Cognitive Status: Within Functional Limits for tasks assessed                                 General Comments: A & O x 4      General Comments      Exercises Total Joint Exercises Ankle Circles/Pumps: AROM;Right;5 reps;Seated Straight Leg Raises: AROM;Right;10 reps;Supine Long Arc Quad: AROM;Right;15 reps;Seated Knee Flexion: AROM;Right;15 reps;Seated Goniometric ROM: 0-10-90 PROM R knee Other Exercises Other Exercises: reveiwed HEP and provided exercise packet.   Assessment/Plan    PT Assessment Patient needs continued PT services  PT Problem List Decreased  strength;Decreased mobility;Decreased safety awareness;Decreased range of motion;Decreased knowledge of precautions;Decreased activity tolerance;Decreased balance;Cardiopulmonary status limiting activity;Pain;Decreased knowledge of use of DME       PT Treatment Interventions DME instruction;Therapeutic activities;Gait training;Therapeutic exercise;Patient/family education;Balance training;Functional mobility training;Neuromuscular re-education    PT Goals (Current goals can be found in the Care Plan section)  Acute Rehab PT Goals Patient Stated Goal: return home PT Goal Formulation: With patient/family Time For Goal Achievement: 01/07/19 Potential to Achieve Goals: Good    Frequency BID   Barriers to discharge        Co-evaluation               AM-PAC PT "6 Clicks" Mobility  Outcome Measure Help needed turning from your back to your side while in a flat bed without using bedrails?: A Little Help needed moving from lying on your back to sitting on the side of a flat bed without using bedrails?: A Little Help needed moving to and from a bed to a chair (including a wheelchair)?: A Little Help needed standing up from a chair using your arms (e.g., wheelchair or bedside chair)?: A Little Help needed to walk in hospital room?: A Little Help needed climbing 3-5 steps with a railing? : A Lot 6 Click Score: 17    End of Session Equipment Utilized  During Treatment: Gait belt(RW) Activity Tolerance: Patient tolerated treatment well;Patient limited by fatigue;Patient limited by pain Patient left: in chair;with call bell/phone within reach;with SCD's reapplied;with family/visitor present;with chair alarm set Nurse Communication: Mobility status PT Visit Diagnosis: Unsteadiness on feet (R26.81);Muscle weakness (generalized) (M62.81);Difficulty in walking, not elsewhere classified (R26.2);Pain Pain - Right/Left: Right Pain - part of body: Knee    Time: [id number] PT Time Calculation  (min) (ACUTE ONLY): 40 min   Charges:   PT Evaluation $PT Eval Moderate Complexity: 1 Mod PT Treatments $Gait Training: 8-22 mins $Therapeutic Activity: 8-22 mins        Everlean Alstrom. Graylon Good, PT, DPT 12/24/18, 3:07 PM

## 2018-12-24 NOTE — Discharge Summary (Addendum)
Physician Discharge Summary  [name] MRN: [id number] DOB/AGE: 83/03/1928 83 y.o.  Admit date: 12/23/2018 Discharge date: 12/26/18  Admission Diagnoses:  PRIMARY OSTEOARTHRITIS RIGHT KNEE  Discharge Diagnoses: Patient Active Problem List   Diagnosis Date Noted  . Total knee replacement status 12/23/2018  . Obstruction of left ureteropelvic junction (UPJ) 02/26/2018  . Sepsis ([place]) 02/10/2018  . Closed burst fracture of lumbar vertebra with routine healing 04/01/2017  . Chronic bronchitis ([place]) 02/18/2017  . Osteoporosis 11/20/2015  . COPD (chronic obstructive pulmonary disease) ([place]) 11/20/2015  . Cyst of ovary 07/26/2015  . Status post total left knee replacement 04/03/2015  . Abnormal chest sounds 08/25/2014  . Allergic rhinitis 08/25/2014  . Colon polyp 08/25/2014  . DDD (degenerative disc disease), cervical 08/25/2014  . DDD (degenerative disc disease), lumbar 08/25/2014  . H/O non-Hodgkin's lymphoma 08/25/2014  . Below normal amount of sodium in the blood 08/25/2014  . Primary osteoarthritis of right knee 08/25/2014  . Subclinical hypothyroidism 08/25/2014  . Adnexal mass 06/03/2014  . 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 History of smoking 03/11/2008    Past Medical History:  Diagnosis Date  . Adnexal mass 06/03/2014   since 2009  . Arthritis   . Complication of anesthesia   . 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 Dyspnea    with exertion  . GERD (gastroesophageal reflux disease)   . Herpes zoster without complication [id number]  . History of hiatal hernia   . History of kidney stones   . HOH (hard of hearing)    wears hearing aides  . Non Hodgkin's lymphoma ([place])   . Personal history of chemotherapy   . PONV (postoperative nausea and vomiting)    in January lasted about 4 hours  . Recurrent UTI    Transfusion: None.   Consultants (if  any):   Discharged Condition: Improved  Hospital Course: Monica Singh is an 85 y.o. female who was admitted 12/23/2018 with a diagnosis of right knee osteoarthritis and went to the operating room on 12/23/2018 and underwent the above named procedures.    Surgeries: Procedure(s): Right total knee arthroplasty using computer-assisted navigation  SURGEON:  Marciano Sequin. M.D.  ASSISTANT: Cassell Smiles, [name] (present and scrubbed throughout the case, critical for assistance with exposure, retraction, instrumentation, and closure)  ANESTHESIA: spinal  ESTIMATED BLOOD LOSS: 50 mL  FLUIDS REPLACED: 1000 mL of crystalloid  TOURNIQUET TIME: 81 minutes  DRAINS: 2 medium Hemovac drains  SOFT TISSUE RELEASES: Anterior cruciate ligament, posterior cruciate ligament, deep medial collateral ligament, patellofemoral ligament  IMPLANTS UTILIZED: DePuy PFC Sigma size 2.5 posterior stabilized femoral component (cemented), size 2.5 MBT tibial component (cemented), 35 mm 3 peg oval dome patella (cemented), and a 10 mm stabilized rotating platform polyethylene insert. Patient tolerated the surgery well. Taken to PACU where she was stabilized and then transferred to the orthopedic floor.  Started on Lovenox 30mg  q 12 hrs. Foot pumps applied bilaterally at 80 mm. Heels elevated on bed with rolled towels. No evidence of DVT. Negative Homan. Physical therapy started on day #1 for gait training and transfer. OT started day #1 for ADL and assisted devices.  Patient's IV and hemovac were removed on POD2.  Foley was removed on POD1.  Implants: DePuy PFC Sigma size 2.5 posterior stabilized femoral component (cemented), size 2.5 MBT tibial component (cemented), 35 mm 3 peg oval dome  patella (cemented), and a 10 mm stabilized rotating platform polyethylene insert.  She was given perioperative antibiotics:  Anti-infectives (From admission, onward)   Start     Dose/Rate Route Frequency Ordered  Stop   12/23/18 1830  ceFAZolin (ANCEF) IVPB 1 g/50 mL premix     1 g 100 mL/hr over 30 Minutes Intravenous Every 6 hours 12/23/18 1823 12/24/18 0323   12/23/18 1028  ceFAZolin (ANCEF) 2-4 GM/100ML-% IVPB    Note to Pharmacy: Monica Singh  : cabinet override      12/23/18 1028 12/23/18 1216   12/23/18 0600  ceFAZolin (ANCEF) IVPB 2g/100 mL premix     2 g 200 mL/hr over 30 Minutes Intravenous On call to O.R. 12/23/18 0229 12/23/18 1228    .  She was given sequential compression devices, early ambulation, and Lovenox for DVT prophylaxis.  She benefited maximally from the hospital stay and there were no complications.    Recent vital signs:  Vitals:   12/26/18 0033 12/26/18 0743  BP: 127/68 132/70  Pulse: (!) 108 (!) 101  Resp: 18 16  Temp: 99 F (37.2 C) 98.1 F (36.7 C)  SpO2: 93% 95%    Recent laboratory studies:  Lab Results  Component Value Date   HGB 13.5 12/16/2018   HGB 12.8 08/05/2018   HGB 10.4 (L) 02/12/2018   Lab Results  Component Value Date   WBC 7.4 12/16/2018   PLT 356 12/16/2018   Lab Results  Component Value Date   INR 1.0 12/16/2018   Lab Results  Component Value Date   NA 137 12/16/2018   K 4.7 12/16/2018   CL 100 12/16/2018   CO2 28 12/16/2018   BUN 16 12/16/2018   CREATININE 0.77 12/16/2018   GLUCOSE 106 (H) 12/16/2018    Discharge Medications:   Allergies as of 12/26/2018      Reactions   Milk-related Compounds Diarrhea, Nausea And Vomiting   Gas too   Lac Bovis Diarrhea, Nausea And Vomiting   Milk allergy Milk allergy   Sulfa Antibiotics Hives, Itching      Medication List    TAKE these medications   acetaminophen 500 MG tablet Commonly known as: TYLENOL Take 500-1,000 mg by mouth every 6 (six) hours as needed (for pain.).   Combivent Respimat 20-100 MCG/ACT Aers respimat Generic drug: Ipratropium-Albuterol INHALE 2 PUFFS INTO THE LUNGS 3 TIMES DAILY What changed: See the Monica instructions.   enoxaparin 40  MG/0.4ML injection Commonly known as: LOVENOX Inject 0.4 mLs (40 mg total) into the skin daily.   ibuprofen 200 MG tablet Commonly known as: ADVIL Take 2 tablets by mouth daily as needed for pain.   nitrofurantoin (macrocrystal-monohydrate) 100 MG capsule Commonly known as: MACROBID Take 1 capsule (100 mg total) by mouth daily.   oxyCODONE 5 MG immediate release tablet Commonly known as: Oxy IR/ROXICODONE Take 1-2 tablets (5-10 mg total) by mouth every 4 (four) hours as needed for moderate pain.   traMADol 50 MG tablet Commonly known as: ULTRAM Take 1 tablet (50 mg total) by mouth every 6 (six) hours as needed for moderate pain.            Durable Medical Equipment  (From admission, onward)         Start     Ordered   12/23/18 1824  DME Walker rolling  Once    Question:  Patient needs a walker to treat with the following condition  Answer:  Total knee replacement status  12/23/18 1823   12/23/18 1824  DME 3 n 1  Once     12/23/18 1823          Diagnostic Studies: Dg Knee Right Port  Result Date: 12/23/2018 CLINICAL DATA:  Status post knee replacement. EXAM: PORTABLE RIGHT KNEE - 1-2 VIEW COMPARISON:  None. FINDINGS: The right femoral and tibial components appear to be well situated. No fracture or dislocation is noted. Surgical drain is noted in soft tissues anterior to distal femur. IMPRESSION: Status post right total knee arthroplasty. Electronically Signed   By: Marijo Conception M.D.   On: 12/23/2018 15:29   Disposition: Plan for discharge home on 12/26/18.  Follow-up Information    [facility] On 01/08/2019.   Specialty: Orthopedic Surgery Why: at 10:45am Contact information: [place] [address] [phone]        Dereck Leep, MD On 02/04/2019.   Specialty: Orthopedic Surgery Why: at 1:45pm Contact information: [place] [address] [phone]          Signed: Fausto Skillern [name] 12/26/2018, 12:15 PM

## 2018-12-25 NOTE — Care Management Important Message (Signed)
Important Message  Patient Details  Name: Monica Singh MRN: [id number] Date of Birth: November 06, 1928   Medicare Important Message Given:  Yes  Initial Medicare IM given by Patient Access Associate on 12/25/2018 at 9:22am.   Dannette Barbara 12/25/2018, 11:02 AM

## 2018-12-25 NOTE — TOC Benefit Eligibility Note (Signed)
Transition of Care [facility]) Benefit Eligibility Note    Patient Details  Name: PATRISIA FAETH MRN: [id number] Date of Birth: Nov 23, 1928   Medication/Dose: Enoxaparin 74m once daily for 14 days  Covered?: Yes  Prescription [facility]age [facility], [facility]  Spoke with Person/Company/Phone Number:: JJanett Billowwith [employer] at [phone] Co-Pay: $101.17 [place] at [place]71[place]16 [place] [place]  Deductible: MMaren[place]Phone Number: [phone] [phone]/27/2020, 9:45 AM

## 2018-12-25 NOTE — Evaluation (Signed)
Occupational Therapy Evaluation Patient Details Name: Monica Singh MRN: [id number] DOB: Aug 14, 1928 Today's Date: 12/25/2018    History of Present Illness Bearden is a 85 y.o. female who was admitted to the [facility] for R right total knee arthroplasty performed on 12/23/18 by Dr. Esmond Harps to degenerative arthrosis of right knee. 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   Clinical Impression   Pt is 85 year old  s/p R TKA.  Pt was living at home alone and independent in all ADLs prior to surgery and is eager to return to PLOF.  She has 2 daughters who plan to help her at home for the first 2 weeks. Pt currently requires minimum assist for LB dressing while in seated position due to pain and limited AROM of R knee using reacher and sock aid.  Pt would benefit from instruction in dressing techniques with or without assistive devices for dressing and bathing skills.  Pt would also benefit from recommendations for home modifications to increase safety in the bathroom and prevent falls. Will assess for OT [facility] needs as pt progresses in therapy but most likely will not need any futrher OT services..      Follow Up Recommendations  No OT follow up    Equipment Recommendations  Tub/shower seat;3 in 1 bedside commode    Recommendations for Other Services       Precautions / Restrictions Precautions Precautions: Fall Restrictions Weight Bearing Restrictions: Yes RLE Weight Bearing: Weight bearing as tolerated      Mobility Bed Mobility Overal bed mobility: Modified Independent Bed Mobility: Supine to Sit     Supine to sit: Modified independent (Device/Increase time)     General bed mobility comments: performs supine>sit requiring extra time secondary to pain  Transfers Overall transfer level: Needs assistance Equipment used: Rolling walker (2  wheeled) Transfers: Sit to/from [facility] Sit to Stand: Min assist Stand pivot transfers: Min assist       General transfer comment: performs sit<>stand from/to bed and stand pivot transfr from bed>chair requiring min A for balance and assistance/extra time to assume upright posture with heavy reliance on UEs. pt requests to sit after standing secondary to pain and dizziness and shortly after begins to vomit.    Balance Overall balance assessment: Needs assistance Sitting-balance support: Feet supported;Bilateral upper extremity supported Sitting balance-Leahy Scale: Fair Sitting balance - Comments: steady sitting on bed and BSC with UE support (of bed and BSC armrests)   Standing balance support: Bilateral upper extremity supported;During functional activity Standing balance-Leahy Scale: Poor Standing balance comment: dependent on RW for support; difficulty WBing on RLE                           ADL either performed or assessed with clinical judgement   ADL Overall ADL's : Needs assistance/impaired Eating/Feeding: Independent;Set up   Grooming: Wash/dry hands;Wash/dry face;Oral care;Applying deodorant;Brushing hair;Independent;Set up   Upper Body Bathing: Independent;Set up   Lower Body Bathing: Minimal assistance;Set up;Sit to/from stand   Upper Body Dressing : Independent;Set up   Lower Body Dressing: Set up;Minimal assistance;Sit to/from stand       Toileting- Water quality scientist and Hygiene: Min guard;Set up   Tub/ Shower Transfer: Stand-pivot;Rolling walker     General ADL Comments: Pt is using RW for ambualtion and needs min guard  for transfers and min  assist for LB dressing skills with ed and training with AD.     Vision Patient Visual Report: No change from baseline       Perception     Praxis      Pertinent Vitals/Pain Pain Assessment: 0-10 Pain Score: 5  Pain Location: R knee Pain Descriptors / Indicators:  Aching Pain Intervention(s): Limited activity within patient's tolerance;Monitored during session;Premedicated before session;Ice applied     Hand Dominance Right   Extremity/Trunk Assessment Upper Extremity Assessment Upper Extremity Assessment: Generalized weakness   Lower Extremity Assessment Lower Extremity Assessment: Defer to PT evaluation       Communication Communication Communication: No difficulties   Cognition Arousal/Alertness: Awake/alert Behavior During Therapy: WFL for tasks assessed/performed Overall Cognitive Status: Within Functional Limits for tasks assessed                                 General Comments: A & O x 4   General Comments       Exercises Total Joint Exercises Ankle Circles/Pumps: AROM;Both;20 reps;Supine Quad Sets: Strengthening;Right;10 reps;Supine Hip ABduction/ADduction: Strengthening;Right;10 reps;Supine Long Arc Quad: Right;10 reps;Seated;AAROM(AAROM to achieve end range extension) Marching in Standing: AROM;Both;10 reps;Standing(with RW)   Shoulder Instructions      Home Living Family/patient expects to be discharged to:: Private residence Living Arrangements: Alone Available Help at Discharge: Family;Available 24 hours/day Type of Home: House Home Access: Level entry     Home Layout: One level     Bathroom Shower/Tub: Occupational psychologist: Standard Bathroom Accessibility: Yes   Home Equipment: Environmental consultant - 2 wheels;Shower seat - built in   Additional Comments: rec she purchase a shower chair with back instead of the stool w/o back she has been using since built in seat is too small to use.      Prior Functioning/Environment Level of Independence: Independent with assistive device(s)        Comments: Reports independent with ambulation with intermittant use of walker. Was I with ADLs and had help with getting groceries and cleaning her home.        OT Problem List: Decreased  strength;Decreased range of motion;Decreased activity tolerance;Pain;Impaired balance (sitting and/or standing)      OT Treatment/Interventions: Self-care/ADL training;Balance training;Patient/family education;DME and/or AE instruction    OT Goals(Current goals can be found in the care plan section) Acute Rehab OT Goals Patient Stated Goal: return home OT Goal Formulation: With patient Time For Goal Achievement: 01/08/19 Potential to Achieve Goals: Good ADL Goals Pt Will Perform Lower Body Dressing: with set-up;sit to/from stand Pt Will Transfer to Toilet: with set-up;stand pivot transfer;regular height toilet;with min guard assist  OT Frequency: Min 2X/week   Barriers to D/C:            Co-evaluation              AM-PAC OT "6 Clicks" Daily Activity     Outcome Measure Help from another person eating meals?: None Help from another person taking care of personal grooming?: None Help from another person toileting, which includes using toliet, bedpan, or urinal?: A Little Help from another person bathing (including washing, rinsing, drying)?: A Little Help from another person to put on and taking off regular upper body clothing?: None Help from another person to put on and taking off regular lower body clothing?: A Little 6 Click Score: 21   End of Session Equipment Utilized During Treatment: Gait belt  Activity Tolerance: Patient tolerated treatment well Patient left: in bed;with call bell/phone within reach;with bed alarm set  OT Visit Diagnosis: Other abnormalities of gait and mobility (R26.89);Pain;Muscle weakness (generalized) (M62.81) Pain - Right/Left: Right Pain - part of body: Knee                Time: [id number] OT Time Calculation (min): 30 min Charges:  OT General Charges $OT Visit: 1 Visit OT Evaluation $OT Eval Low Complexity: 1 Low OT Treatments $Self Care/Home Management : 8-22 mins  Chrys Racer, OTR/L, [place] ascom [phone] 12/25/18, 10:47  AM

## 2018-12-25 NOTE — Progress Notes (Signed)
Physical Therapy Treatment Patient Details Name: Monica Singh MRN: [id number] DOB: 1929-01-19 Today's Date: 12/25/2018    History of Present Illness Quinby is a 85 y.o. female who was admitted to the [facility] for R right total knee arthroplasty performed on 12/23/18 by Dr. Esmond Harps to degenerative arthrosis of right knee. 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    PT Comments    Pt with improved tolerance to PT session this PM session though continues to be limited in ambulation distance secondary to pain and fatigue. Pt's vitals signs monitored throughout session and remaining within normal limits with exception of O2 minimally decreased to 91%; pt educated to perform pursed lip breathing throughout session. Pt performs supine>sit bed mobility with mod I requiring extra time, transfers STS with CGA and RW for safety, and ambulates 150' with RW with CGA and verbal cuing (as described below) for safety. Pt will continue to benefit from skilled PT intervention for improved strength, range of motion, balance and pain to promote return to prior level of function.  Follow Up Recommendations  Home health PT;Supervision for mobility/OOB     Equipment Recommendations  Rolling walker with 5" wheels;3in1 (PT)    Recommendations for Other Services       Precautions / Restrictions Precautions Precautions: Fall Restrictions Weight Bearing Restrictions: Yes RLE Weight Bearing: Weight bearing as tolerated    Mobility  Bed Mobility Overal bed mobility: Modified Independent Bed Mobility: Supine to Sit     Supine to sit: Modified independent (Device/Increase time)     General bed mobility comments: performs supine>sit requiring extra time secondary to pain  Transfers Overall transfer level: Needs assistance Equipment used: Rolling walker (2  wheeled) Transfers: Sit to/from Stand Sit to Stand: Min guard Stand pivot transfers: Min assist       General transfer comment: performs sit<>stand from/to bed and stand pivot transfr from bed>chair requiring min A for balance and assistance/extra time to assume upright posture with heavy reliance on UEs. pt requests to sit after standing secondary to pain and dizziness and shortly after begins to vomit.  Ambulation/Gait Ambulation/Gait assistance: Min guard Gait Distance (Feet): 150 Feet Assistive device: Rolling walker (2 wheeled) Gait Pattern/deviations: Step-through pattern;Step-to pattern;Antalgic;Decreased step length - left;Decreased dorsiflexion - right     General Gait Details: Pt able to improve L step length and R heel strike with verbal cuing; VCs for RW management with good carryover, including remaining within base of RW and turning with smaller radius   Stairs             Wheelchair Mobility    Modified Rankin (Stroke Patients Only)       Balance Overall balance assessment: Needs assistance Sitting-balance support: Feet supported Sitting balance-Leahy Scale: Good Sitting balance - Comments: steady sitting edge of bed   Standing balance support: Bilateral upper extremity supported Standing balance-Leahy Scale: Fair Standing balance comment: less heavy reliance on UEs this session compared to AM and improved R weight acceptance                            Cognition Arousal/Alertness: Awake/alert Behavior During Therapy: WFL for tasks assessed/performed Overall Cognitive Status: Within Functional Limits for tasks assessed  General Comments: A & O x 4      Exercises Total Joint Exercises Ankle Circles/Pumps: AROM;Both;20 reps;Supine Quad Sets: Strengthening;Right;10 reps;Supine Hip ABduction/ADduction: Strengthening;Right;10 reps;Supine Long Arc Quad: Right;10 reps;Seated;AAROM(AAROM to  achieve end range extension) Marching in Standing: AROM;Both;10 reps;Standing    General Comments        Pertinent Vitals/Pain Pain Assessment: 0-10 Pain Score: 0-No pain Pain Location: R knee; 0/10 at rest; 9/10 with ambulation, reporting "hot nail" in knee Pain Descriptors / Indicators: Aching;Burning Pain Intervention(s): Limited activity within patient's tolerance;Monitored during session;Ice applied    Home Living Family/patient expects to be discharged to:: Private residence Living Arrangements: Alone Available Help at Discharge: Family;Available 24 hours/day Type of Home: House Home Access: Level entry   Home Layout: One level Home Equipment: Walker - 2 wheels;Shower seat - built in Additional Comments: rec she purchase a shower chair with back instead of the stool w/o back she has been using since built in seat is too small to use.    Prior Function Level of Independence: Independent with assistive device(s)      Comments: Reports independent with ambulation with intermittant use of walker. Was I with ADLs and had help with getting groceries and cleaning her home.   PT Goals (current goals can now be found in the care plan section) Acute Rehab PT Goals Patient Stated Goal: return home PT Goal Formulation: With patient/family Time For Goal Achievement: 01/07/19 Potential to Achieve Goals: Good Progress towards PT goals: Progressing toward goals    Frequency    BID      PT Plan Current plan remains appropriate    Co-evaluation              AM-PAC PT "6 Clicks" Mobility   Outcome Measure  Help needed turning from your back to your side while in a flat bed without using bedrails?: None Help needed moving from lying on your back to sitting on the side of a flat bed without using bedrails?: None Help needed moving to and from a bed to a chair (including a wheelchair)?: A Little Help needed standing up from a chair using your arms (e.g., wheelchair or  bedside chair)?: A Little Help needed to walk in hospital room?: A Little Help needed climbing 3-5 steps with a railing? : A Lot 6 Click Score: 19    End of Session Equipment Utilized During Treatment: Gait belt Activity Tolerance: Patient tolerated treatment well Patient left: in chair;with call bell/phone within reach;with SCD's reapplied;with family/visitor present;with chair alarm set   PT Visit Diagnosis: Unsteadiness on feet (R26.81);Muscle weakness (generalized) (M62.81);Difficulty in walking, not elsewhere classified (R26.2);Pain Pain - Right/Left: Right Pain - part of body: Knee     Time: 1207-1235 PT Time Calculation (min) (ACUTE ONLY): 28 min  Charges:  $Gait Training: 8-22 mins $Therapeutic Exercise: 8-22 mins                    Petra Kuba, PT, DPT 12/25/18, 12:55 PM

## 2018-12-25 NOTE — Progress Notes (Signed)
  Subjective: 2 Days Post-Op Procedure(s) (LRB): RIGHT COMPUTER ASSISTED TOTAL KNEE ARTHROPLASTY (Right) Patient reports pain as 4 on 0-10 scale.   Patient is well, but has had some minor complaints of increased pain and vomiting during the AM PT session. Plan is to go Home after hospital stay. Negative for chest pain and shortness of breath Fever: no Gastrointestinal: positive for nausea and vomiting during the PT session, but no nausea currently.   nPatient has had a bowel movement.  Objective: Vital signs in last 24 hours: Temp:  [97.8 F (36.6 C)-98.4 F (36.9 C)] 97.8 F (36.6 C) (11/27 0813) Pulse Rate:  [86-103] 103 (11/27 0948) Resp:  [14-16] 16 (11/27 0813) BP: (117-169)/(52-82) 157/82 (11/27 0948) SpO2:  [91 %-94 %] 94 % (11/27 0813)  Intake/Output from previous day:  Intake/Output Summary (Last 24 hours) at 12/25/2018 1215 Last data filed at 12/25/2018 0500 Gross per 24 hour  Intake 295.13 ml  Output 41 ml  Net 254.13 ml    Intake/Output this shift: No intake/output data recorded.  Labs: No results for input(s): HGB in the last 72 hours. No results for input(s): WBC, RBC, HCT, PLT in the last 72 hours. No results for input(s): NA, K, CL, CO2, BUN, CREATININE, GLUCOSE, CALCIUM in the last 72 hours. No results for input(s): LABPT, INR in the last 72 hours.   EXAM General - Patient is Alert, Appropriate and Oriented Extremity - Dorsiflexion/Plantar flexion intact Compartment soft Dressing/Incision -Postoperative dressing remains in place., Polar Care in place and working. , Hemovac in place. Leg in Bone Foam. Motor Function - intact, moving foot and toes well on exam. Unable to perform SLR today. Cardiovascular- Regular rate and rhythm, no murmurs/rubs/gallops Respiratory- Lungs clear to auscultation bilaterally Gastrointestinal- soft, nontender and active bowel sounds   Assessment/Plan: 2 Days Post-Op Procedure(s) (LRB): RIGHT COMPUTER ASSISTED TOTAL  KNEE ARTHROPLASTY (Right) Active Problems:   Total knee replacement status  Estimated body mass index is 25.32 kg/m as calculated from the following:   Height as of this encounter: 5\' 2"  (1.575 m).   Weight as of 12/16/18: 62.8 kg. Advance diet Up with therapy  Plan for discharge today pending successful completion of PT goals this afternoon. If not, will plan for discharge tomorrow.   Hemovac and postoperative dressing removed. Fresh honeycomb dressing applied.   DVT Prophylaxis - Lovenox, Ted hose and foot pumps Weight-Bearing as tolerated to right leg  Cassell Smiles, [name] [facility] Orthopaedic Surgery 12/25/2018, 12:15 PM

## 2018-12-25 NOTE — Progress Notes (Addendum)
Physical Therapy Treatment Patient Details Name: Monica Singh MRN: [id number] DOB: 04/03/1928 Today's Date: 12/25/2018    History of Present Illness [place] is a 85 y.o. female who was admitted to the [facility] for R right total knee arthroplasty performed on 12/23/18 by Dr. Esmond Harps to degenerative arthrosis of right knee. 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    PT Comments    Pt limited in treatment session today secondary to R knee pain, limiting ability to weight bear during standing activities, as well as reports of dizziness and nausea upon standing. Pt also with noted elevated HR (103bpm) following brief bout of supine exercises, with pt's baseline ranging from 79-91; BP assessed at 157/64mmHg. Deferred gait today for safety concerns. Pt instructed in supine ther ex for strengthening, ROM and in preparation for DC with exercise program. Pt able to perform bed mobility supine>sit with mod I requiring extra time secondary to pain. Pt performs transfers with min A with RW demonstrating heavy reliance on UEs and poor ability to shift weight and bear weight on RLE today secondary to pain; pt requires verbal cues for standing pivot transfer for safe RW management.  Following sit>stand transfer, pt requests to sit secondary to R knee pain and dizziness, and begins to vomit. Once dizziness and emesis subsides, pt requests to use bathroom and is able to perform stand pivot transfer from bed>BSC with min A. Pt will continue to benefit from skilled PT intervention for improved strength, range of motion, pain, balance to promote return to prior level of function.  Follow Up Recommendations        Equipment Recommendations       Recommendations for Other Services       Precautions / Restrictions Precautions Precautions: Fall Restrictions Weight Bearing  Restrictions: Yes RLE Weight Bearing: Weight bearing as tolerated    Mobility  Bed Mobility Overal bed mobility: Modified Independent Bed Mobility: Supine to Sit     Supine to sit: Modified independent (Device/Increase time)     General bed mobility comments: performs supine>sit requiring extra time secondary to pain  Transfers Overall transfer level: Needs assistance Equipment used: Rolling walker (2 wheeled) Transfers: Sit to/from [facility] Sit to Stand: Min assist Stand pivot transfers: Min assist       General transfer comment: performs sit<>stand from/to bed and stand pivot transfr from bed>chair requiring min A for balance and assistance/extra time to assume upright posture with heavy reliance on UEs. pt requests to sit after standing secondary to pain and dizziness and shortly after begins to vomit.  Ambulation/Gait             General Gait Details: Deferred gait today secondary to pt's reports of dizziness upon standing, difficulty bearing weight on RLE secondary to pain, and elevated HR (108) following sit>stand; pt requests to sit after standing and shortly after begins to vomit.   Stairs             Wheelchair Mobility    Modified Rankin (Stroke Patients Only)       Balance Overall balance assessment: Needs assistance Sitting-balance support: Feet supported;Bilateral upper extremity supported Sitting balance-Leahy Scale: Fair Sitting balance - Comments: steady sitting on bed and BSC with UE support (of bed and BSC armrests)   Standing balance support: Bilateral upper extremity supported;During functional activity Standing balance-Leahy Scale: Poor Standing balance comment: dependent on RW  for support; difficulty WBing on RLE                            Cognition Arousal/Alertness: Awake/alert Behavior During Therapy: WFL for tasks assessed/performed Overall Cognitive Status: Within Functional Limits for tasks  assessed                                 General Comments: A & O x 4      Exercises Total Joint Exercises Ankle Circles/Pumps: AROM;Both;20 reps;Supine Quad Sets: Strengthening;Right;10 reps;Supine Hip ABduction/ADduction: Strengthening;Right;10 reps;Supine Long Arc Quad: Right;10 reps;Seated;AAROM(AAROM to achieve end range extension) Marching in Standing: AROM;Both;10 reps;Standing(with RW)    General Comments        Pertinent Vitals/Pain Pain Assessment: 0-10 Pain Score: 7  Pain Location: 7/10 when encountered supine in bed; remaining 7/10 following supine TE and STS transfer Pain Descriptors / Indicators: Grimacing;Aching Pain Intervention(s): Limited activity within patient's tolerance;Monitored during session;RN gave pain meds during session    Home Living                      Prior Function            PT Goals (current goals can now be found in the care plan section) Acute Rehab PT Goals Patient Stated Goal: return home PT Goal Formulation: With patient/family Time For Goal Achievement: 01/07/19 Potential to Achieve Goals: Good Progress towards PT goals: Progressing toward goals    Frequency           PT Plan      Co-evaluation              AM-PAC PT "6 Clicks" Mobility   Outcome Measure                   End of Session               Time: [id number] PT Time Calculation (min) (ACUTE ONLY): 38 min  Charges:  $Therapeutic Exercise: 23-37 mins $Therapeutic Activity: 8-22 mins                     Petra Kuba, PT, DPT 12/25/18, 10:06 AM

## 2018-12-25 NOTE — Progress Notes (Signed)
error 

## 2018-12-26 MED ORDER — ENOXAPARIN SODIUM 40 MG/0.4ML ~~LOC~~ SOLN: 40.0000 mg | SUBCUTANEOUS | 0 refills | Status: DC

## 2018-12-26 NOTE — Progress Notes (Signed)
Physical Therapy Treatment Patient Details Name: Monica Singh MRN: [id number] DOB: Aug 09, 1928 Today's Date: 12/26/2018    History of Present Illness [place] is a 85 y.o. female who was admitted to the [facility] for R right total knee arthroplasty performed on 12/23/18 by Dr. Esmond Harps to degenerative arthrosis of right knee. 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    PT Comments    Pt is demonstrating a safe gait pattern with RW, and is comfortable with the longer walk to get home.  Pt is quite concerned with being able to go home, and is motivated to continue on with therapy there.  Follow in BID format until she is ready to go and will keep educating pt and family on safety and sequence.   Follow Up Recommendations  Home health PT;Supervision for mobility/OOB     Equipment Recommendations  Rolling walker with 5" wheels;3in1 (PT)    Recommendations for Other Services OT consult     Precautions / Restrictions Precautions Precautions: Fall;Knee Precaution Comments: instructed sequence with stairs Restrictions Weight Bearing Restrictions: Yes RLE Weight Bearing: Weight bearing as tolerated    Mobility  Bed Mobility Overal bed mobility: Modified Independent Bed Mobility: Supine to Sit           General bed mobility comments: pt was OOB and did not ask to go back yet  Transfers Overall transfer level: Needs assistance Equipment used: Rolling walker (2 wheeled);1 person hand held assist Transfers: Sit to/from Stand Sit to Stand: Min guard         General transfer comment: min guard with vc's for hand placement  Ambulation/Gait Ambulation/Gait assistance: Min guard Gait Distance (Feet): 300 Feet Assistive device: Rolling walker (2 wheeled) Gait Pattern/deviations: Step-through pattern;Decreased stride length;Decreased weight  shift to right;Wide base of support Gait velocity: mild reduction Gait velocity interpretation: <1.31 ft/sec, indicative of household ambulator General Gait Details: pt was good to follow instructions for safety and sequence, instructed not to back up a longer trip to chair   Stairs Stairs: Yes Stairs assistance: Min guard Stair Management: No rails;Step to pattern;Forwards Number of Stairs: 2 General stair comments: pt used RW on level surface then to step up and down a single step   Wheelchair Mobility    Modified Rankin (Stroke Patients Only)       Balance     Sitting balance-Leahy Scale: Good       Standing balance-Leahy Scale: Fair                              Cognition Arousal/Alertness: Awake/alert Behavior During Therapy: WFL for tasks assessed/performed Overall Cognitive Status: Within Functional Limits for tasks assessed                                        Exercises Total Joint Exercises Ankle Circles/Pumps: AROM;5 reps Quad Sets: 10 reps Heel Slides: Strengthening;10 reps Hip ABduction/ADduction: AROM;10 reps Long Arc Quad: Strengthening;10 reps    General Comments General comments (skin integrity, edema, etc.): pt was up in chair after walking and stairs, good tolerance and positioned with straight knee, ice applied and blankets      Pertinent Vitals/Pain Pain Assessment: 0-10 Pain Score: 6  Pain Location: 8 with gait, 4 at  rest Pain Descriptors / Indicators: Operative site guarding;Grimacing Pain Intervention(s): Limited activity within patient's tolerance;Monitored during session;Ice applied    Home Living                      Prior Function            PT Goals (current goals can now be found in the care plan section) Acute Rehab PT Goals Patient Stated Goal: return home Progress towards PT goals: Progressing toward goals    Frequency    BID      PT Plan Current plan remains appropriate     Co-evaluation              AM-PAC PT "6 Clicks" Mobility   Outcome Measure  Help needed turning from your back to your side while in a flat bed without using bedrails?: None Help needed moving from lying on your back to sitting on the side of a flat bed without using bedrails?: None Help needed moving to and from a bed to a chair (including a wheelchair)?: None Help needed standing up from a chair using your arms (e.g., wheelchair or bedside chair)?: A Little Help needed to walk in hospital room?: A Little Help needed climbing 3-5 steps with a railing? : A Little 6 Click Score: 21    End of Session Equipment Utilized During Treatment: Gait belt Activity Tolerance: Patient tolerated treatment well Patient left: in chair;with call bell/phone within reach;with SCD's reapplied;with family/visitor present;with chair alarm set Nurse Communication: Mobility status PT Visit Diagnosis: Unsteadiness on feet (R26.81);Muscle weakness (generalized) (M62.81);Difficulty in walking, not elsewhere classified (R26.2);Pain Pain - Right/Left: Right Pain - part of body: Knee     Time: 1210-1241 PT Time Calculation (min) (ACUTE ONLY): 31 min  Charges:  $Gait Training: 8-22 mins $Therapeutic Exercise: 8-22 mins $Therapeutic Activity: 8-22 mins                   Ramond Dial 12/26/2018, 12:55 PM   Mee Hives, PT MS Acute Rehab Dept. Number: [place] and [place]

## 2018-12-26 NOTE — Progress Notes (Signed)
  Subjective: 3 Days Post-Op Procedure(s) (LRB): RIGHT COMPUTER ASSISTED TOTAL KNEE ARTHROPLASTY (Right) Patient reports pain as well-controlled.   Patient is well, and has had no acute complaints or problems Plan is to go Home after hospital stay. Negative for chest pain and shortness of breath Fever: no Gastrointestinal: negative for nausea and vomiting.  Patient has had a bowel movement.  Objective: Vital signs in last 24 hours: Temp:  [98.1 F (36.7 C)-99 F (37.2 C)] 98.1 F (36.7 C) (11/28 0743) Pulse Rate:  [91-108] 101 (11/28 0743) Resp:  [16-18] 16 (11/28 0743) BP: (127-159)/(68-79) 132/70 (11/28 0743) SpO2:  [91 %-95 %] 95 % (11/28 0743)  Intake/Output from previous day: No intake or output data in the 24 hours ending 12/26/18 1158  Intake/Output this shift: No intake/output data recorded.  Labs: No results for input(s): HGB in the last 72 hours. No results for input(s): WBC, RBC, HCT, PLT in the last 72 hours. No results for input(s): NA, K, CL, CO2, BUN, CREATININE, GLUCOSE, CALCIUM in the last 72 hours. No results for input(s): LABPT, INR in the last 72 hours.   EXAM General - Patient is Alert, Appropriate and Oriented Extremity - Neurovascular intact Dorsiflexion/Plantar flexion intact Compartment soft Dressing/Incision -clean, dry, no drainage, Polar Care in place and working.  Motor Function - intact, moving foot and toes well on exam. Unable to perform SLR at this time  Cardiovascular- Regular rhythm with her rate around 100 bpm, no murmurs/rubs/gallops Respiratory- crackles heard in the left lower lobe, otherwise clear to auscultation  Gastrointestinal- soft, nontender and active bowel sounds   Assessment/Plan: 3 Days Post-Op Procedure(s) (LRB): RIGHT COMPUTER ASSISTED TOTAL KNEE ARTHROPLASTY (Right) Active Problems:   Total knee replacement status  Estimated body mass index is 25.32 kg/m as calculated from the following:   Height as of this  encounter: 5\' 2"  (1.575 m).   Weight as of 12/16/18: 62.8 kg. Advance diet Up with therapy Discharge home with home health pending completion of PT goals.     DVT Prophylaxis - Lovenox, Ted hose and foot pumps Weight-Bearing as tolerated to right leg  Cassell Smiles, [name] [facility] Orthopaedic Surgery 12/26/2018, 11:58 AM

## 2018-12-26 NOTE — Progress Notes (Signed)
Discharge instructions reviewed with patient and daughter. They verbalized understanding. Old dressing removed, incision washed, new dressing applied. 2 dressings sent home with patient. IV removed. Stable condition. Patient is now being wheeled out by NT.

## 2018-12-26 NOTE — Progress Notes (Signed)
Physical Therapy Treatment Patient Details Name: Monica Singh MRN: [id number] DOB: [birth year] Today's Date: 12/26/2018    History of Present Illness [place] is a 85 y.o. female who was admitted to the [facility] for R right total knee arthroplasty performed on 12/23/18 by Dr. Esmond Harps to degenerative arthrosis of right knee. 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    PT Comments    Pt was seen for mobility and ROM/strengthening to RLE with good response to visit.  Per PA pt will need to take a 300' walk to go home today, and will reattempt this in a short time to prepare her for home.  Pt will be continuing therapy to transfer home, with stairs completed and able to get OOB with no help.  Follow Up Recommendations  Home health PT;Supervision for mobility/OOB     Equipment Recommendations  Rolling walker with 5" wheels;3in1 (PT)    Recommendations for Other Services OT consult     Precautions / Restrictions Precautions Precautions: Fall;Knee Precaution Comments: instructed sequence with stairs Restrictions Weight Bearing Restrictions: Yes RLE Weight Bearing: Weight bearing as tolerated    Mobility  Bed Mobility Overal bed mobility: Modified Independent Bed Mobility: Supine to Sit              Transfers Overall transfer level: Needs assistance Equipment used: Rolling walker (2 wheeled);1 person hand held assist Transfers: Sit to/from Stand Sit to Stand: Min guard         General transfer comment: min guard with vc's for hand placement  Ambulation/Gait Ambulation/Gait assistance: Min guard Gait Distance (Feet): 40 Feet Assistive device: Rolling walker (2 wheeled) Gait Pattern/deviations: Step-through pattern;Decreased stride length;Decreased weight shift to right;Wide base of support Gait velocity: mild reduction Gait  velocity interpretation: <1.31 ft/sec, indicative of household ambulator General Gait Details: pt was good to follow instructions for safety and sequence, instructed not to back up a longer trip to chair   Stairs Stairs: Yes Stairs assistance: Min guard Stair Management: No rails;Step to pattern;Forwards Number of Stairs: 2 General stair comments: pt used RW on level surface then to step up and down a single step   Wheelchair Mobility    Modified Rankin (Stroke Patients Only)       Balance     Sitting balance-Leahy Scale: Good       Standing balance-Leahy Scale: Fair                              Cognition Arousal/Alertness: Awake/alert Behavior During Therapy: WFL for tasks assessed/performed Overall Cognitive Status: Within Functional Limits for tasks assessed                                        Exercises Total Joint Exercises Ankle Circles/Pumps: AROM;Both;5 reps Quad Sets: AROM;Both;10 reps Heel Slides: Strengthening;10 reps Hip ABduction/ADduction: AROM;10 reps Long Arc Quad: Strengthening;10 reps    General Comments General comments (skin integrity, edema, etc.): pt was up in chair after walking and stairs, good tolerance and positioned with straight knee, ice applied and blankets      Pertinent Vitals/Pain Pain Assessment: 0-10 Pain Score: 8  Pain Location: 8 with gait, 4 at rest Pain Descriptors / Indicators: Operative site guarding;Grimacing Pain Intervention(s): Limited activity within  patient's tolerance;Monitored during session;Premedicated before session;Repositioned;Ice applied    Home Living                      Prior Function            PT Goals (current goals can now be found in the care plan section) Acute Rehab PT Goals Patient Stated Goal: return home Progress towards PT goals: Progressing toward goals    Frequency    BID      PT Plan Current plan remains appropriate     Co-evaluation              AM-PAC PT "6 Clicks" Mobility   Outcome Measure  Help needed turning from your back to your side while in a flat bed without using bedrails?: None Help needed moving from lying on your back to sitting on the side of a flat bed without using bedrails?: None Help needed moving to and from a bed to a chair (including a wheelchair)?: None Help needed standing up from a chair using your arms (e.g., wheelchair or bedside chair)?: A Little Help needed to walk in hospital room?: A Little Help needed climbing 3-5 steps with a railing? : A Little 6 Click Score: 21    End of Session Equipment Utilized During Treatment: Gait belt Activity Tolerance: Patient tolerated treatment well Patient left: in chair;with call bell/phone within reach;with SCD's reapplied;with family/visitor present;with chair alarm set Nurse Communication: Mobility status PT Visit Diagnosis: Unsteadiness on feet (R26.81);Muscle weakness (generalized) (M62.81);Difficulty in walking, not elsewhere classified (R26.2);Pain Pain - Right/Left: Right Pain - part of body: Knee     Time: [id number] PT Time Calculation (min) (ACUTE ONLY): 30 min  Charges:  $Gait Training: 8-22 mins $Therapeutic Exercise: 8-22 mins                   Ramond Dial 12/26/2018, 11:30 AM   Mee Hives, PT MS Acute Rehab Dept. Number: [place] and [place]

## 2018-12-27 DIAGNOSIS — Z96653 Presence of artificial knee joint, bilateral: Secondary | ICD-10-CM | POA: Diagnosis not present

## 2018-12-27 DIAGNOSIS — M5136 Other intervertebral disc degeneration, lumbar region: Secondary | ICD-10-CM | POA: Diagnosis not present

## 2018-12-27 DIAGNOSIS — Z7901 Long term (current) use of anticoagulants: Secondary | ICD-10-CM | POA: Diagnosis not present

## 2018-12-27 DIAGNOSIS — Z8572 Personal history of non-Hodgkin lymphomas: Secondary | ICD-10-CM | POA: Diagnosis not present

## 2018-12-27 DIAGNOSIS — Z7951 Long term (current) use of inhaled steroids: Secondary | ICD-10-CM | POA: Diagnosis not present

## 2018-12-27 DIAGNOSIS — M810 Age-related osteoporosis without current pathological fracture: Secondary | ICD-10-CM | POA: Diagnosis not present

## 2018-12-27 DIAGNOSIS — Z471 Aftercare following joint replacement surgery: Secondary | ICD-10-CM | POA: Diagnosis not present

## 2018-12-27 DIAGNOSIS — M5030 Other cervical disc degeneration, unspecified cervical region: Secondary | ICD-10-CM | POA: Diagnosis not present

## 2018-12-27 DIAGNOSIS — K219 Gastro-esophageal reflux disease without esophagitis: Secondary | ICD-10-CM | POA: Diagnosis not present

## 2018-12-27 DIAGNOSIS — Z87891 Personal history of nicotine dependence: Secondary | ICD-10-CM | POA: Diagnosis not present

## 2018-12-27 DIAGNOSIS — J309 Allergic rhinitis, unspecified: Secondary | ICD-10-CM | POA: Diagnosis not present

## 2018-12-27 DIAGNOSIS — J449 Chronic obstructive pulmonary disease, unspecified: Secondary | ICD-10-CM | POA: Diagnosis not present

## 2018-12-27 DIAGNOSIS — Z9181 History of falling: Secondary | ICD-10-CM | POA: Diagnosis not present

## 2018-12-27 DIAGNOSIS — H9190 Unspecified hearing loss, unspecified ear: Secondary | ICD-10-CM | POA: Diagnosis not present

## 2018-12-27 DIAGNOSIS — Z85828 Personal history of other malignant neoplasm of skin: Secondary | ICD-10-CM | POA: Diagnosis not present

## 2018-12-27 DIAGNOSIS — E039 Hypothyroidism, unspecified: Secondary | ICD-10-CM | POA: Diagnosis not present

## 2018-12-28 ENCOUNTER — Encounter: Payer: Self-pay | Admitting: Orthopedic Surgery

## 2018-12-29 DIAGNOSIS — K219 Gastro-esophageal reflux disease without esophagitis: Secondary | ICD-10-CM | POA: Diagnosis not present

## 2018-12-29 DIAGNOSIS — Z87891 Personal history of nicotine dependence: Secondary | ICD-10-CM | POA: Diagnosis not present

## 2018-12-29 DIAGNOSIS — M810 Age-related osteoporosis without current pathological fracture: Secondary | ICD-10-CM | POA: Diagnosis not present

## 2018-12-29 DIAGNOSIS — E039 Hypothyroidism, unspecified: Secondary | ICD-10-CM | POA: Diagnosis not present

## 2018-12-29 DIAGNOSIS — Z7951 Long term (current) use of inhaled steroids: Secondary | ICD-10-CM | POA: Diagnosis not present

## 2018-12-29 DIAGNOSIS — M5136 Other intervertebral disc degeneration, lumbar region: Secondary | ICD-10-CM | POA: Diagnosis not present

## 2018-12-29 DIAGNOSIS — H9190 Unspecified hearing loss, unspecified ear: Secondary | ICD-10-CM | POA: Diagnosis not present

## 2018-12-29 DIAGNOSIS — Z8572 Personal history of non-Hodgkin lymphomas: Secondary | ICD-10-CM | POA: Diagnosis not present

## 2018-12-29 DIAGNOSIS — J449 Chronic obstructive pulmonary disease, unspecified: Secondary | ICD-10-CM | POA: Diagnosis not present

## 2018-12-29 DIAGNOSIS — J309 Allergic rhinitis, unspecified: Secondary | ICD-10-CM | POA: Diagnosis not present

## 2018-12-29 DIAGNOSIS — Z9181 History of falling: Secondary | ICD-10-CM | POA: Diagnosis not present

## 2018-12-29 DIAGNOSIS — Z96653 Presence of artificial knee joint, bilateral: Secondary | ICD-10-CM | POA: Diagnosis not present

## 2018-12-29 DIAGNOSIS — Z7901 Long term (current) use of anticoagulants: Secondary | ICD-10-CM | POA: Diagnosis not present

## 2018-12-29 DIAGNOSIS — Z471 Aftercare following joint replacement surgery: Secondary | ICD-10-CM | POA: Diagnosis not present

## 2018-12-29 DIAGNOSIS — M5030 Other cervical disc degeneration, unspecified cervical region: Secondary | ICD-10-CM | POA: Diagnosis not present

## 2018-12-29 DIAGNOSIS — Z85828 Personal history of other malignant neoplasm of skin: Secondary | ICD-10-CM | POA: Diagnosis not present

## 2018-12-30 DIAGNOSIS — Z96659 Presence of unspecified artificial knee joint: Secondary | ICD-10-CM | POA: Diagnosis not present

## 2018-12-31 DIAGNOSIS — Z85828 Personal history of other malignant neoplasm of skin: Secondary | ICD-10-CM | POA: Diagnosis not present

## 2018-12-31 DIAGNOSIS — J309 Allergic rhinitis, unspecified: Secondary | ICD-10-CM | POA: Diagnosis not present

## 2018-12-31 DIAGNOSIS — Z96653 Presence of artificial knee joint, bilateral: Secondary | ICD-10-CM | POA: Diagnosis not present

## 2018-12-31 DIAGNOSIS — Z471 Aftercare following joint replacement surgery: Secondary | ICD-10-CM | POA: Diagnosis not present

## 2018-12-31 DIAGNOSIS — M810 Age-related osteoporosis without current pathological fracture: Secondary | ICD-10-CM | POA: Diagnosis not present

## 2018-12-31 DIAGNOSIS — Z7951 Long term (current) use of inhaled steroids: Secondary | ICD-10-CM | POA: Diagnosis not present

## 2018-12-31 DIAGNOSIS — Z8572 Personal history of non-Hodgkin lymphomas: Secondary | ICD-10-CM | POA: Diagnosis not present

## 2018-12-31 DIAGNOSIS — J449 Chronic obstructive pulmonary disease, unspecified: Secondary | ICD-10-CM | POA: Diagnosis not present

## 2018-12-31 DIAGNOSIS — E039 Hypothyroidism, unspecified: Secondary | ICD-10-CM | POA: Diagnosis not present

## 2018-12-31 DIAGNOSIS — H9190 Unspecified hearing loss, unspecified ear: Secondary | ICD-10-CM | POA: Diagnosis not present

## 2018-12-31 DIAGNOSIS — Z7901 Long term (current) use of anticoagulants: Secondary | ICD-10-CM | POA: Diagnosis not present

## 2018-12-31 DIAGNOSIS — M5030 Other cervical disc degeneration, unspecified cervical region: Secondary | ICD-10-CM | POA: Diagnosis not present

## 2018-12-31 DIAGNOSIS — Z9181 History of falling: Secondary | ICD-10-CM | POA: Diagnosis not present

## 2018-12-31 DIAGNOSIS — M5136 Other intervertebral disc degeneration, lumbar region: Secondary | ICD-10-CM | POA: Diagnosis not present

## 2018-12-31 DIAGNOSIS — Z87891 Personal history of nicotine dependence: Secondary | ICD-10-CM | POA: Diagnosis not present

## 2018-12-31 DIAGNOSIS — K219 Gastro-esophageal reflux disease without esophagitis: Secondary | ICD-10-CM | POA: Diagnosis not present

## 2019-01-04 DIAGNOSIS — Z85828 Personal history of other malignant neoplasm of skin: Secondary | ICD-10-CM | POA: Diagnosis not present

## 2019-01-04 DIAGNOSIS — Z7901 Long term (current) use of anticoagulants: Secondary | ICD-10-CM | POA: Diagnosis not present

## 2019-01-04 DIAGNOSIS — Z471 Aftercare following joint replacement surgery: Secondary | ICD-10-CM | POA: Diagnosis not present

## 2019-01-04 DIAGNOSIS — M810 Age-related osteoporosis without current pathological fracture: Secondary | ICD-10-CM | POA: Diagnosis not present

## 2019-01-04 DIAGNOSIS — Z7951 Long term (current) use of inhaled steroids: Secondary | ICD-10-CM | POA: Diagnosis not present

## 2019-01-04 DIAGNOSIS — Z87891 Personal history of nicotine dependence: Secondary | ICD-10-CM | POA: Diagnosis not present

## 2019-01-04 DIAGNOSIS — E039 Hypothyroidism, unspecified: Secondary | ICD-10-CM | POA: Diagnosis not present

## 2019-01-04 DIAGNOSIS — J449 Chronic obstructive pulmonary disease, unspecified: Secondary | ICD-10-CM | POA: Diagnosis not present

## 2019-01-04 DIAGNOSIS — M5136 Other intervertebral disc degeneration, lumbar region: Secondary | ICD-10-CM | POA: Diagnosis not present

## 2019-01-04 DIAGNOSIS — Z96653 Presence of artificial knee joint, bilateral: Secondary | ICD-10-CM | POA: Diagnosis not present

## 2019-01-04 DIAGNOSIS — Z8572 Personal history of non-Hodgkin lymphomas: Secondary | ICD-10-CM | POA: Diagnosis not present

## 2019-01-04 DIAGNOSIS — K219 Gastro-esophageal reflux disease without esophagitis: Secondary | ICD-10-CM | POA: Diagnosis not present

## 2019-01-04 DIAGNOSIS — Z9181 History of falling: Secondary | ICD-10-CM | POA: Diagnosis not present

## 2019-01-04 DIAGNOSIS — H9190 Unspecified hearing loss, unspecified ear: Secondary | ICD-10-CM | POA: Diagnosis not present

## 2019-01-04 DIAGNOSIS — M5030 Other cervical disc degeneration, unspecified cervical region: Secondary | ICD-10-CM | POA: Diagnosis not present

## 2019-01-04 DIAGNOSIS — J309 Allergic rhinitis, unspecified: Secondary | ICD-10-CM | POA: Diagnosis not present

## 2019-01-06 DIAGNOSIS — Z96653 Presence of artificial knee joint, bilateral: Secondary | ICD-10-CM | POA: Diagnosis not present

## 2019-01-06 DIAGNOSIS — H9190 Unspecified hearing loss, unspecified ear: Secondary | ICD-10-CM | POA: Diagnosis not present

## 2019-01-06 DIAGNOSIS — E039 Hypothyroidism, unspecified: Secondary | ICD-10-CM | POA: Diagnosis not present

## 2019-01-06 DIAGNOSIS — M5030 Other cervical disc degeneration, unspecified cervical region: Secondary | ICD-10-CM | POA: Diagnosis not present

## 2019-01-06 DIAGNOSIS — Z9181 History of falling: Secondary | ICD-10-CM | POA: Diagnosis not present

## 2019-01-06 DIAGNOSIS — M5136 Other intervertebral disc degeneration, lumbar region: Secondary | ICD-10-CM | POA: Diagnosis not present

## 2019-01-06 DIAGNOSIS — K219 Gastro-esophageal reflux disease without esophagitis: Secondary | ICD-10-CM | POA: Diagnosis not present

## 2019-01-06 DIAGNOSIS — Z85828 Personal history of other malignant neoplasm of skin: Secondary | ICD-10-CM | POA: Diagnosis not present

## 2019-01-06 DIAGNOSIS — Z87891 Personal history of nicotine dependence: Secondary | ICD-10-CM | POA: Diagnosis not present

## 2019-01-06 DIAGNOSIS — Z7901 Long term (current) use of anticoagulants: Secondary | ICD-10-CM | POA: Diagnosis not present

## 2019-01-06 DIAGNOSIS — Z471 Aftercare following joint replacement surgery: Secondary | ICD-10-CM | POA: Diagnosis not present

## 2019-01-06 DIAGNOSIS — M810 Age-related osteoporosis without current pathological fracture: Secondary | ICD-10-CM | POA: Diagnosis not present

## 2019-01-06 DIAGNOSIS — Z8572 Personal history of non-Hodgkin lymphomas: Secondary | ICD-10-CM | POA: Diagnosis not present

## 2019-01-06 DIAGNOSIS — Z7951 Long term (current) use of inhaled steroids: Secondary | ICD-10-CM | POA: Diagnosis not present

## 2019-01-06 DIAGNOSIS — J309 Allergic rhinitis, unspecified: Secondary | ICD-10-CM | POA: Diagnosis not present

## 2019-01-06 DIAGNOSIS — J449 Chronic obstructive pulmonary disease, unspecified: Secondary | ICD-10-CM | POA: Diagnosis not present

## 2019-01-08 DIAGNOSIS — Z96651 Presence of right artificial knee joint: Secondary | ICD-10-CM | POA: Diagnosis not present

## 2019-01-08 DIAGNOSIS — M25561 Pain in right knee: Secondary | ICD-10-CM | POA: Diagnosis not present

## 2019-01-08 DIAGNOSIS — G8929 Other chronic pain: Secondary | ICD-10-CM | POA: Diagnosis not present

## 2019-01-08 DIAGNOSIS — M6281 Muscle weakness (generalized): Secondary | ICD-10-CM | POA: Diagnosis not present

## 2019-01-08 DIAGNOSIS — M25661 Stiffness of right knee, not elsewhere classified: Secondary | ICD-10-CM | POA: Diagnosis not present

## 2019-01-12 DIAGNOSIS — G8929 Other chronic pain: Secondary | ICD-10-CM | POA: Diagnosis not present

## 2019-01-12 DIAGNOSIS — M25561 Pain in right knee: Secondary | ICD-10-CM | POA: Diagnosis not present

## 2019-01-12 DIAGNOSIS — M25661 Stiffness of right knee, not elsewhere classified: Secondary | ICD-10-CM | POA: Diagnosis not present

## 2019-01-12 DIAGNOSIS — Z96651 Presence of right artificial knee joint: Secondary | ICD-10-CM | POA: Diagnosis not present

## 2019-01-12 DIAGNOSIS — M6281 Muscle weakness (generalized): Secondary | ICD-10-CM | POA: Diagnosis not present

## 2019-01-14 DIAGNOSIS — Z96651 Presence of right artificial knee joint: Secondary | ICD-10-CM | POA: Diagnosis not present

## 2019-01-14 DIAGNOSIS — M6281 Muscle weakness (generalized): Secondary | ICD-10-CM | POA: Diagnosis not present

## 2019-01-14 DIAGNOSIS — M25561 Pain in right knee: Secondary | ICD-10-CM | POA: Diagnosis not present

## 2019-01-14 DIAGNOSIS — G8929 Other chronic pain: Secondary | ICD-10-CM | POA: Diagnosis not present

## 2019-01-14 DIAGNOSIS — M25661 Stiffness of right knee, not elsewhere classified: Secondary | ICD-10-CM | POA: Diagnosis not present

## 2019-01-18 DIAGNOSIS — M25661 Stiffness of right knee, not elsewhere classified: Secondary | ICD-10-CM | POA: Diagnosis not present

## 2019-01-18 DIAGNOSIS — M6281 Muscle weakness (generalized): Secondary | ICD-10-CM | POA: Diagnosis not present

## 2019-01-18 DIAGNOSIS — Z96651 Presence of right artificial knee joint: Secondary | ICD-10-CM | POA: Diagnosis not present

## 2019-01-18 DIAGNOSIS — M25561 Pain in right knee: Secondary | ICD-10-CM | POA: Diagnosis not present

## 2019-01-18 DIAGNOSIS — G8929 Other chronic pain: Secondary | ICD-10-CM | POA: Diagnosis not present

## 2019-01-20 DIAGNOSIS — M25661 Stiffness of right knee, not elsewhere classified: Secondary | ICD-10-CM | POA: Diagnosis not present

## 2019-01-20 DIAGNOSIS — Z96651 Presence of right artificial knee joint: Secondary | ICD-10-CM | POA: Diagnosis not present

## 2019-01-20 DIAGNOSIS — M25561 Pain in right knee: Secondary | ICD-10-CM | POA: Diagnosis not present

## 2019-01-20 DIAGNOSIS — G8929 Other chronic pain: Secondary | ICD-10-CM | POA: Diagnosis not present

## 2019-01-20 DIAGNOSIS — M6281 Muscle weakness (generalized): Secondary | ICD-10-CM | POA: Diagnosis not present

## 2019-01-26 DIAGNOSIS — Z96651 Presence of right artificial knee joint: Secondary | ICD-10-CM | POA: Diagnosis not present

## 2019-01-26 DIAGNOSIS — M25561 Pain in right knee: Secondary | ICD-10-CM | POA: Diagnosis not present

## 2019-01-26 DIAGNOSIS — M25661 Stiffness of right knee, not elsewhere classified: Secondary | ICD-10-CM | POA: Diagnosis not present

## 2019-01-26 DIAGNOSIS — M6281 Muscle weakness (generalized): Secondary | ICD-10-CM | POA: Diagnosis not present

## 2019-01-26 DIAGNOSIS — G8929 Other chronic pain: Secondary | ICD-10-CM | POA: Diagnosis not present

## 2019-01-28 ENCOUNTER — Other Ambulatory Visit: Payer: Medicare Other

## 2019-01-28 ENCOUNTER — Other Ambulatory Visit: Payer: Self-pay

## 2019-01-28 ENCOUNTER — Other Ambulatory Visit: Payer: Self-pay | Admitting: Physician Assistant

## 2019-01-28 ENCOUNTER — Telehealth: Payer: Self-pay | Admitting: Physician Assistant

## 2019-01-28 DIAGNOSIS — M6281 Muscle weakness (generalized): Secondary | ICD-10-CM | POA: Diagnosis not present

## 2019-01-28 DIAGNOSIS — M25661 Stiffness of right knee, not elsewhere classified: Secondary | ICD-10-CM | POA: Diagnosis not present

## 2019-01-28 DIAGNOSIS — G8929 Other chronic pain: Secondary | ICD-10-CM | POA: Diagnosis not present

## 2019-01-28 DIAGNOSIS — R300 Dysuria: Secondary | ICD-10-CM | POA: Diagnosis not present

## 2019-01-28 DIAGNOSIS — N390 Urinary tract infection, site not specified: Secondary | ICD-10-CM

## 2019-01-28 DIAGNOSIS — M25561 Pain in right knee: Secondary | ICD-10-CM | POA: Diagnosis not present

## 2019-01-28 DIAGNOSIS — Z96651 Presence of right artificial knee joint: Secondary | ICD-10-CM | POA: Diagnosis not present

## 2019-01-28 LAB — URINALYSIS, COMPLETE
Bilirubin, UA: NEGATIVE
Glucose, UA: NEGATIVE
Ketones, UA: NEGATIVE
Nitrite, UA: POSITIVE — AB
Specific Gravity, UA: 1.02 (ref 1.005–1.030)
Urobilinogen, Ur: 0.2 mg/dL (ref 0.2–1.0)
pH, UA: 7 (ref 5.0–7.5)

## 2019-01-28 LAB — MICROSCOPIC EXAMINATION

## 2019-01-28 MED ORDER — CIPROFLOXACIN HCL 500 MG PO TABS: 500.0000 mg | ORAL_TABLET | Freq: Two times a day (BID) | ORAL | 0 refills | Status: AC

## 2019-01-28 NOTE — Telephone Encounter (Signed)
Patient dropped off urine sample for UA and culture today with complaints of dysuria.  UA grossly infected.  Will send for culture.  I just spoke with the patient's daughter, [name], over telephone to inform her that I have prescribed ciprofloxacin twice daily x7 days for treatment of acute UTI.  She expressed understanding.

## 2019-02-01 LAB — CULTURE, URINE COMPREHENSIVE

## 2019-02-02 DIAGNOSIS — G8929 Other chronic pain: Secondary | ICD-10-CM | POA: Diagnosis not present

## 2019-02-02 DIAGNOSIS — M25561 Pain in right knee: Secondary | ICD-10-CM | POA: Diagnosis not present

## 2019-02-02 DIAGNOSIS — Z96651 Presence of right artificial knee joint: Secondary | ICD-10-CM | POA: Diagnosis not present

## 2019-02-02 DIAGNOSIS — M25661 Stiffness of right knee, not elsewhere classified: Secondary | ICD-10-CM | POA: Diagnosis not present

## 2019-02-02 DIAGNOSIS — M6281 Muscle weakness (generalized): Secondary | ICD-10-CM | POA: Diagnosis not present

## 2019-02-04 DIAGNOSIS — M25561 Pain in right knee: Secondary | ICD-10-CM | POA: Diagnosis not present

## 2019-02-04 DIAGNOSIS — G8929 Other chronic pain: Secondary | ICD-10-CM | POA: Diagnosis not present

## 2019-02-08 ENCOUNTER — Ambulatory Visit: Payer: Medicare Other | Admitting: Urology

## 2019-02-15 ENCOUNTER — Ambulatory Visit: Payer: Self-pay | Admitting: Urology

## 2019-02-22 DIAGNOSIS — Z471 Aftercare following joint replacement surgery: Secondary | ICD-10-CM | POA: Diagnosis not present

## 2019-03-03 ENCOUNTER — Encounter: Payer: Self-pay | Admitting: Urology

## 2019-03-03 ENCOUNTER — Ambulatory Visit: Payer: Medicare Other | Admitting: Urology

## 2019-03-03 ENCOUNTER — Other Ambulatory Visit: Payer: Self-pay

## 2019-03-03 VITALS — BP 164/89 | HR 102 | Ht 62.0 in | Wt 137.2 lb

## 2019-03-03 DIAGNOSIS — N135 Crossing vessel and stricture of ureter without hydronephrosis: Secondary | ICD-10-CM

## 2019-03-03 NOTE — Progress Notes (Signed)
   03/03/2019 10:59 AM   Monica Singh [birth year] [id number]  Reason for visit: Follow up Left UPJ obstruction  HPI: I saw Monica Singh and her daughter back in urology clinic for routine follow-up of her left chronic UPJ obstruction managed with an indwelling chronic ureteral stent.  Briefly, she is a very nice 86 year old female who originally presented in the fall 2019 with recurrent urinary tract infections, severe left-sided flank pain, and was ultimately found to have new onset of left idiopathic UPJ obstruction with moderate to severe upstream hydronephrosis.  Diagnostic ureteroscopy in January 2020 did not show any concerning lesions and cytology was negative.  She underwent stent placement at that time with complete resolution of her left-sided pain.  She has had approximately 3 UTIs over the last year with the stent in place, and the stent was last changed on 07/2018.  Most recent imaging with a renal ultrasound in September 2020 showed no evidence of hydronephrosis.  She is not taking any prophylactic antibiotics at this time.    Most recent renal function is normal with a creatinine of 0.77, EGFR greater than 60 which is improved from prior to stent placement of creatinine 1.12, eGFR 44.  Overall, she feels she is doing very well and denies any complaints today.  She specifically denies any dysuria, flank pain, gross hematuria, fevers, or confusion.  Both she and her daughter are in agreement to continue management with a chronic ureteral stent.  We again discussed that this needs to be changed every 9 to 12 months.  We will plan for neck stent change in April 2021.  We again reviewed UTI prevention strategies including adequate hydration and cranberry tablet prophylaxis.  We discussed return precautions including dysuria, fevers/chills, flank pain, or confusion.  Will schedule cystoscopy, left ureteral stent change in OR under [facility] in April 2021  A total of 20 minutes were spent  face-to-face with the patient, greater than 50% was spent in patient education, counseling, and coordination of care regarding UPJ obstruction and stent changes.  Monica Singh, [facility] [address], [place],  [address] [phone]

## 2019-03-03 NOTE — Patient Instructions (Signed)
Ureteral Stent Implantation  Ureteral stent implantation is a procedure to insert (implant) a flexible, soft, plastic tube (stent) into a ureter. Ureters are the tube-like parts of the body that drain urine from the kidneys. The stent supports the ureter while it heals and helps to drain urine. You may have a ureteral stent implanted after having a procedure to remove a blockage from the ureter (ureterolysis or pyeloplasty). You may also have a stent implanted to open the flow of urine when you have a blockage caused by a kidney stone, tumor, blood clot, or infection. You have two ureters, one on each side of the body. The ureters connect the kidneys to the organ that holds urine until it passes out of the body (bladder). The stent is placed so that one end is in the kidney, and one end is in the bladder. The stent is usually taken out after your ureter has healed. Depending on your condition, you may have a stent for just a few weeks, or you may have a long-term stent that will need to be replaced every few months. Tell a health care provider about:  Any allergies you have.  All medicines you are taking, including vitamins, herbs, eye drops, creams, and over-the-counter medicines.  Any problems you or family members have had with anesthetic medicines.  Any blood disorders you have.  Any surgeries you have had.  Any medical conditions you have.  Whether you are pregnant or may be pregnant. What are the risks? Generally, this is a safe procedure. However, problems may occur, including:  Infection.  Bleeding.  Allergic reactions to medicines.  Damage to other structures or organs. Tearing (perforation) of the ureter is possible.  Movement of the stent away from where it is placed during surgery (migration). What happens before the procedure? Medicines Ask your health care provider about:  Changing or stopping your regular medicines. This is especially important if you are taking  diabetes medicines or blood thinners.  Taking medicines such as aspirin and ibuprofen. These medicines can thin your blood. Do not take these medicines unless your health care provider tells you to take them.  Taking over-the-counter medicines, vitamins, herbs, and supplements. Eating and drinking Follow instructions from your health care provider about eating and drinking, which may include:  8 hours before the procedure - stop eating heavy meals or foods, such as meat, fried foods, or fatty foods.  6 hours before the procedure - stop eating light meals or foods, such as toast or cereal.  6 hours before the procedure - stop drinking milk or drinks that contain milk.  2 hours before the procedure - stop drinking clear liquids. Staying hydrated Follow instructions from your health care provider about hydration, which may include:  Up to 2 hours before the procedure - you may continue to drink clear liquids, such as water, clear fruit juice, black coffee, and plain tea. General instructions  Do not drink alcohol.  Do not use any products that contain nicotine or tobacco for at least 4 weeks before the procedure. These products include cigarettes, e-cigarettes, and chewing tobacco. If you need help quitting, ask your health care provider.  You may have an exam or testing, such as imaging or blood tests.  Ask your health care provider what steps will be taken to help prevent infection. These may include: ? Removing hair at the surgery site. ? Washing skin with a germ-killing soap. ? Taking antibiotic medicine.  Plan to have someone take you home   from the hospital or clinic.  If you will be going home right after the procedure, plan to have someone with you for 24 hours. What happens during the procedure?  An IV will be inserted into one of your veins.  You may be given a medicine to help you relax (sedative).  You may be given a medicine to make you fall asleep (general  anesthetic).  A thin, tube-shaped instrument with a light and tiny camera at the end (cystoscope) will be inserted into your urethra. The urethra is the tube that drains urine from the bladder out of the body. In men, the urethra opens at the end of the penis. In women, the urethra opens in front of the vaginal opening.  The cystoscope will be passed into your bladder.  A thin wire (guide wire) will be passed through your bladder and into your ureter. This is used to guide the stent into your ureter.  The stent will be inserted into your ureter.  The guide wire and the cystoscope will be removed.  A flexible tube (catheter) may be inserted through your urethra so that one end is in your bladder. This helps to drain urine from your bladder. The procedure may vary among hospitals and health care providers. What happens after the procedure?  Your blood pressure, heart rate, breathing rate, and blood oxygen level will be monitored until you leave the hospital or clinic.  You may continue to receive medicine and fluids through an IV.  You may have some soreness or pain in your abdomen and urethra. Medicines will be available to help you.  You will be encouraged to get up and walk around as soon as you can.  You may have a catheter draining your urine.  You will have some blood in your urine.  Do not drive for 24 hours if you were given a sedative during your procedure. Summary  Ureteral stent implantation is a procedure to insert a flexible, soft, plastic tube (stent) into a ureter.  You may have a stent implanted to support the ureter while it heals after a procedure or to open the flow of urine if there is a blockage.  Follow instructions from your health care provider about taking medicines and about eating and drinking before the procedure.  Depending on your condition, you may have a stent for just a few weeks, or you may have a long-term stent that will need to be replaced every  few months. This information is not intended to replace advice given to you by your health care provider. Make sure you discuss any questions you have with your health care provider. Document Revised: 10/21/2017 Document Reviewed: 10/22/2017 Elsevier Patient Education  2020 Elsevier Inc.  

## 2019-03-23 ENCOUNTER — Other Ambulatory Visit: Payer: Self-pay

## 2019-03-23 NOTE — Progress Notes (Signed)
Patient pre screened for office appointment, no questions or concerns today. Patient reminded of upcoming appointment time and date. Patient has care giver, care giver supplied chart information.

## 2019-03-24 ENCOUNTER — Other Ambulatory Visit: Payer: Self-pay

## 2019-03-24 ENCOUNTER — Inpatient Hospital Stay: Payer: Medicare Other | Attending: Obstetrics and Gynecology | Admitting: Obstetrics and Gynecology

## 2019-03-24 VITALS — BP 154/82 | HR 94 | Temp 96.7°F | Resp 18 | Wt 137.0 lb

## 2019-03-24 DIAGNOSIS — Z87891 Personal history of nicotine dependence: Secondary | ICD-10-CM | POA: Insufficient documentation

## 2019-03-24 DIAGNOSIS — N83201 Unspecified ovarian cyst, right side: Secondary | ICD-10-CM | POA: Insufficient documentation

## 2019-03-24 DIAGNOSIS — Z8572 Personal history of non-Hodgkin lymphomas: Secondary | ICD-10-CM | POA: Insufficient documentation

## 2019-03-24 DIAGNOSIS — N83209 Unspecified ovarian cyst, unspecified side: Secondary | ICD-10-CM

## 2019-03-24 DIAGNOSIS — N1330 Unspecified hydronephrosis: Secondary | ICD-10-CM | POA: Diagnosis not present

## 2019-03-24 NOTE — Progress Notes (Signed)
Gynecologic Oncology Interval Note  Referring MD: Dr. Forest Gleason- Dr. Janese Banks  PCP MD: Margarita Rana, MD [address] [place], [place] [address] [phone]  Chief Complaint: Ovarian cyst surveillance  Subjective:  Monica Singh is a 86 y.o. woman, who returns to clinic for continued surveillance for history of ovarian cyst.   No new complaints pelvic pain.  On exam on 03/25/2018 with Dr. Theora Gianotti, cyst was felt to be stable and she remained asymptomatic. No relevant interval imaging. 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 Janese Banks with medical-oncology. She had been seen by Dr. Sabra Heck for several years for an adnexal mass has been present since 2009. She is status post hysterectomy. Her history is as follows:  CA-125 on 01/2011 was 12.4  03/01/2011 she had an ultrasound compared to 2011. Within the left is a cystic mass measuring 2.1 x 3.98 x 3.2 cm. What appeared to be the ovary included a cyst measuring 4.3 x 4.32 x 3.32 cm.   CA-125 on 03/2011 was 13.0  Repeat ultrasound 09/03/2011 the left ovary measured 4.2 x 3.1 x 3.4 cm. The cystic mass measured 3.2 x 2.5 x 2.9 cm. The cystic mass was relatively anechoic without a dominant area of nodularity. There may be mild thickening of the wall but similar to prior.   CA-125 on 09/2011 was 11.3.   Ultrasound on 10/08/2012 on the left ovary demonstrated and measured 4.5 x 4.1 x 3.5 cm and contained a 4 x 3.2 x 3.7 cm cystic area there is persistent solid and cystic adnexal process presumed to reflect ovary and associated ovarian cyst.  06/13/2014 ultrasound  compared to 11/26/2013 and 10/08/2012  was reasssuring as noted below. Her CA125 was 11.7. Measurements: 4.5 x 3.9  x 3.6 cm. There is an anechoic structure measuring 3.6 x 3.6 x 3.4 cm which is slightly smaller than on the previous study. Previous dimensions of this anechoic structure were 4.4 x 3.4 x 3.4 cm IMPRESSION: 1. Slight interval decrease in the size of the hypoechoic slightly irregularly marginated left ovarian cystic structure. 2. The right ovary could not be demonstrated. The uterus is surgically absent.  06/13/2014 Pelvic ultrasound Left ovary Measurements: Left ovary not definitively seen. Simple appearing adnexal cyst measuring 4.2 x 4.4 x 3.9 cm. This is unchanged in size and appearance since prior study  03/25/2017- Pelvic ultrasound - Uterus Hysterectomy. Right ovary Not visualized Left ovary Not well visualized. A 4.2 x 3.6 x 2.7 cm cyst, most likely simple, is again noted in the left adnexal Other findings:  No abnormal free fluid. IMPRESSION: A 4.2 x 3.6 x 2.7 cm cyst, most likely simple, is again noted in the left adnexal region. Similar finding noted on prior exam. No significant interim change. This is almost certainly benign, but follow up ultrasound is recommended in 1 year according to the Society of Radiologists in [place] Statement (D Clovis Riley et al. Management of Asymptomatic Ovarian and Other Adnexal Cysts Imaged at [place]: Society of Radiologists in [place] Statement 2010. Radiology 256 (Sept 2010): [id number].).   On 03/26/17 - She states her recent pain in her left pelvis/left hip area has resolved. Pain occurred 3-4 days ago rated 9/10  causing her to dry heave. It presented spontaneously and resolved spontaneously. It last 2 days. She has recently had back surgery after a fall at home.    We reviewed, "A recent expert review suggested that low-risk abnormalities can undergo an initial 55-month follow-up, with those that remain stable or decreasing in size being examined every 12 months for 5 years." -Eulah Citizen RW. Evaluation and  management of ultrasonographically detected ovarian tumors in asymptomatic women. Obstet Gynecol [id number].   Exam on 03/26/17 revealed stable asymptomatic ovarian cyst. In interim, she has had left ureteral stent placed for hydronephrosis . CT imaging on 01/09/2018 measured left ovarian cyst at 5.2 cm with small peripheral area of calcification.   Repeat CT scan 1/20 showed ovarian cyst unchanged.    Problem List: Patient Active Problem List   Diagnosis Date Noted  . Total knee replacement status 12/23/2018  . Obstruction of left ureteropelvic junction (UPJ) 02/26/2018  . Sepsis ([place]) 02/10/2018  . Closed burst fracture of lumbar vertebra with routine healing 04/01/2017  . Chronic bronchitis ([place]) 02/18/2017  . Osteoporosis 11/20/2015  . COPD (chronic obstructive pulmonary disease) ([place]) 11/20/2015  . Cyst of ovary 07/26/2015  . Status post total left knee replacement 04/03/2015  . Abnormal chest sounds 08/25/2014  . Allergic rhinitis 08/25/2014  . Colon polyp 08/25/2014  . DDD (degenerative disc disease), cervical 08/25/2014  . DDD (degenerative disc disease), lumbar 08/25/2014  . H/O non-Hodgkin's lymphoma 08/25/2014  . Below normal amount of sodium in the blood 08/25/2014  . Primary osteoarthritis of right knee 08/25/2014  . Subclinical hypothyroidism 08/25/2014  . Adnexal mass 06/03/2014  . Osteoarthritis 03/19/2011  . 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 History of smoking 03/11/2008    Past Medical History: Past Medical History:  Diagnosis Date  . Adnexal mass 06/03/2014   since 2009  . Arthritis   . Complication of anesthesia   . Diffuse large cell lymphoma in remission ([place]) 01/2007   NON-HODGKINS-stage 3, cd 20 positive; status post 6 cycles of R-CHOP  . Dyspnea    with exertion  . GERD (gastroesophageal reflux disease)   . Herpes zoster without complication [id number]  . History of hiatal hernia   . History of kidney stones   . HOH (hard of  hearing)    wears hearing aides  . Non Hodgkin's lymphoma ([place])   . Personal history of chemotherapy   . PONV (postoperative nausea and vomiting)    in January lasted about 4 hours  . Recurrent UTI     Past Surgical History: Past Surgical History:  Procedure Laterality Date  . ABDOMINAL SURGERY  2009   abdominal mass+ NH lymphoma,  . BACK SURGERY  01/2017   fusion. metal plate in neck at back  . CATARACT EXTRACTION  1997   right eye and left ey  . cervical neck fusion  1995  . CYSTOSCOPY W/ RETROGRADES Left 01/30/2018   Procedure: CYSTOSCOPY WITH RETROGRADE PYELOGRAM;  Surgeon: Billey Co, MD;  Location: [facility];  Service: Urology;  Laterality: Left;  . CYSTOSCOPY W/ RETROGRADES Left 08/05/2018   Procedure: CYSTOSCOPY WITH RETROGRADE PYELOGRAM;  Surgeon: Billey Co, MD;  Location: [facility];  Service: Urology;  Laterality: Left;  . CYSTOSCOPY W/ URETERAL STENT PLACEMENT Left 08/05/2018   Procedure: CYSTOSCOPY WITH STENT Exchange;  Surgeon: Billey Co, MD;  Location: [facility];  Service: Urology;  Laterality: Left;  . CYSTOSCOPY WITH BIOPSY Left 02/27/2018   Procedure: CYSTOSCOPY WITH  BIOPSY;  Surgeon: Billey Co, MD;  Location: [facility];  Service: Urology;  Laterality: Left;  . CYSTOSCOPY WITH STENT PLACEMENT Left 01/30/2018   Procedure: CYSTOSCOPY WITH STENT PLACEMENT;  Surgeon: Billey Co, MD;  Location: [facility];  Service: Urology;  Laterality: Left;  . CYSTOSCOPY WITH URETEROSCOPY AND STENT PLACEMENT Left 02/27/2018   Procedure: CYSTOSCOPY WITH URETEROSCOPY AND STENT Exchange;  Surgeon: Billey Co, MD;  Location: [facility];  Service: Urology;  Laterality: Left;  . DENTAL SURGERY     screws  . JOINT REPLACEMENT Left    knee  . KNEE ARTHROPLASTY Right 12/23/2018   Procedure: RIGHT COMPUTER ASSISTED TOTAL KNEE ARTHROPLASTY;  Surgeon: Dereck Leep, MD;  Location: [facility];  Service: Orthopedics;  Laterality: Right;  . KYPHOPLASTY N/A 02/21/2017    Procedure: [id number];  Surgeon: Hessie Knows, MD;  Location: [facility];  Service: Orthopedics;  Laterality: N/A;  . laparotomy with biopsy  03/02/2007  . PORTACATH PLACEMENT  2009  . [place]  . SQUAMOUS CELL CARCINOMA EXCISION     right arm  . TOTAL KNEE ARTHROPLASTY Left    2009  . URETEROSCOPY Left 01/30/2018   Procedure: URETEROSCOPY;  Surgeon: Billey Co, MD;  Location: [facility];  Service: Urology;  Laterality: Left;  Marland Kitchen VAGINAL HYSTERECTOMY  1971    Family History: Family History  Problem Relation Age of Onset  . Breast cancer Sister   . Dementia Sister   . Cataracts Sister   . Heart attack Brother   . CAD Brother   . Heart attack Brother   . Leukemia Grandchild        granddaughter  . Kidney disease Neg Hx   . Bladder Cancer Neg Hx     Social History: Social History   Socioeconomic History  . Marital status: Widowed    Spouse name: Not on file  . Number of children: 5  . Years of education: Not on file  . Highest education level: GED or equivalent  Occupational History  . Occupation: retired    Comment: homemaker  Tobacco Use  . Smoking status: Former Smoker    Packs/day: 0.25    Types: Cigarettes    Quit date: 06/21/1989    Years since quitting: 29.7  . Smokeless tobacco: Never Used  Substance and Sexual Activity  . Alcohol use: Yes    Alcohol/week: 14.0 standard drinks    Types: 14 Glasses of wine per week    Comment: 1 glass a night  . Drug use: No  . Sexual activity: Not Currently    Birth control/protection: Post-menopausal  Other Topics Concern  . Not on file  Social History Narrative   Ambulates independently, lives at home by herself   Social Determinants of Health   Financial Resource Strain:   . Difficulty of Paying Living Expenses: Not on file  Food Insecurity:   . Worried About Charity fundraiser in the Last Year: Not on file  . Ran Out of Food in the Last Year: Not on file  Transportation Needs:   . Lack of  Transportation (Medical): Not on file  . Lack of Transportation (Non-Medical): Not on file  Physical Activity:   . Days of Exercise per Week: Not on file  . Minutes of Exercise per Session: Not on file  Stress:   . Feeling of Stress : Not on file  Social Connections:   . Frequency of Communication with Friends and Family: Not on file  .  Frequency of Social Gatherings with Friends and Family: Not on file  . Attends Religious Services: Not on file  . Active Member of Clubs or Organizations: Not on file  . Attends Archivist Meetings: Not on file  . Marital Status: Not on file  Intimate Partner Violence:   . Fear of Current or Ex-Partner: Not on file  . Emotionally Abused: Not on file  . Physically Abused: Not on file  . Sexually Abused: Not on file    Allergies: Allergies  Allergen Reactions  . Milk-Related Compounds Diarrhea and Nausea And Vomiting    Gas too  . Other     Other reaction(s): precocet, flexeril  . [place] Diarrhea and Nausea And Vomiting    Milk allergy Milk allergy  . Sulfa Antibiotics Hives and Itching    Current Medications: Current Outpatient Medications  Medication Sig Dispense Refill  . acetaminophen (TYLENOL) 500 MG tablet Take 500-1,000 mg by mouth every 6 (six) hours as needed (for pain.).    Marland Kitchen COMBIVENT RESPIMAT 20-100 MCG/ACT AERS respimat INHALE 2 PUFFS INTO THE LUNGS 3 TIMES DAILY (Patient taking differently: Inhale 2 puffs into the lungs daily. ) 4 g 5  . FLUZONE HIGH-DOSE QUADRIVALENT 0.7 ML SUSY     . ibuprofen (ADVIL) 200 MG tablet Take 2 tablets by mouth daily as needed for pain.     No current facility-administered medications for this visit.   Review of Systems General:  no complaints Skin: no complaints Eyes: no complaints HEENT: no complaints Breasts: no complaints Pulmonary: no complaints Cardiac: no complaints Gastrointestinal: no complaints Genitourinary/Sexual: no complaints Ob/Gyn: no  complaints Musculoskeletal: no complaints Hematology: no complaints Neurologic/Psych: no complaints  Objective:  BP (!) 154/82 (BP Location: Left Arm, Patient Position: Sitting)   Pulse 94   Temp (!) 96.7 F (35.9 C) (Tympanic)   Resp 18   Wt 137 lb (62.1 kg)   SpO2 100%   BMI 25.06 kg/m    ECOG Performance Status: 0 - Asymptomatic   GENERAL: Patient is a well appearing female in no acute distress Neck: no masses or thyromegaly LUNGS:  Clear to auscultation bilaterally.  No wheezes or rhonchi. HEART:  Regular rate and rhythm. No murmur appreciated. ABDOMEN:  Soft, nontender.  Positive, normoactive bowel sounds.  EXTREMITIES:  No peripheral edema.   SKIN:  Clear with no obvious rashes or skin changes. No nail dyscrasia. NEURO:  Nonfocal. Well oriented.  Appropriate affect.  Pelvic: Exam chaperoned by nursing;  Vulva: normal appearing vulva with no masses, tenderness or lesions; Vagina: normal vagina; Adnexa: non palpable, nontender and no masses; no tenderness with deep palpation. Uterus: surgically absent, vaginal cuff normal; Cervix: absent; Rectal: confirmatory  Lab Review  Lab Results  Component Value Date   CA125 11.7 06/22/2014   CA125 11.1 11/24/2013   CA125 11.3 10/14/2011    Assessment:  Monica Singh is a 86 y.o. female with a 5.2 cm benign appearing left ovarian cyst, asymptomatic and stable over serial scans. CA125 normal.  Also has hydronephrosis and ureteral stent for UPJ stenosis on that side, not felt to be related to the ovarian cyst.   S/p ureteral stent for UPJ stenosis (at renal pelvis) and UTI/sepsis. Stent changed q8mo.  Plan:   Problem List Items Addressed This Visit      Endocrine   Cyst of ovary - Primary     Will order pelvic US now, as recommended on CT scan one year ago. If ovarian cyst stable,  she can follow up with her primary care physician and see [place] back in the future if needed in view of the stability of the cyst.    Patient  and daughter comfortable with this plan.   Spent 15 minutes face to face with the patient.   Mellody Drown, MD  CC:  Margarita Rana, MD [address] [place], [place] [address] [phone]  Rachelle Hora PA, Orthopedics

## 2019-03-26 ENCOUNTER — Telehealth: Payer: Self-pay

## 2019-03-26 NOTE — Telephone Encounter (Signed)
Pt's daughter calls and states that her mother is having dark cloudy urine, she denies fever, chills, nausea, vomiting or pain.   Called pt's daughter back informed her that dark urine is not an indicator of infection, informed daughter of true uti indicators including those listed above. Daughter gave verbal understanding. Advised daughter to call back when and if these sx return.

## 2019-03-29 ENCOUNTER — Other Ambulatory Visit: Payer: Self-pay | Admitting: Nurse Practitioner

## 2019-03-29 ENCOUNTER — Ambulatory Visit
Admission: RE | Admit: 2019-03-29 | Discharge: 2019-03-29 | Disposition: A | Discharge status: Home / Self Care | Payer: Medicare Other | Source: Ambulatory Visit | Attending: Nurse Practitioner | Admitting: Nurse Practitioner

## 2019-03-29 ENCOUNTER — Other Ambulatory Visit: Payer: Self-pay

## 2019-03-29 DIAGNOSIS — N83209 Unspecified ovarian cyst, unspecified side: Secondary | ICD-10-CM | POA: Diagnosis present

## 2019-04-06 DIAGNOSIS — M533 Sacrococcygeal disorders, not elsewhere classified: Secondary | ICD-10-CM | POA: Diagnosis not present

## 2019-04-07 ENCOUNTER — Telehealth: Payer: Self-pay | Admitting: Nurse Practitioner

## 2019-04-07 NOTE — Telephone Encounter (Signed)
Called to review u/s results. No answer. Left message. Would plan to refer back to pcp for follow up care as cyst is stable to slightly decreased in size.

## 2019-04-08 ENCOUNTER — Telehealth: Payer: Self-pay | Admitting: *Deleted

## 2019-04-08 NOTE — Telephone Encounter (Signed)
Daughter Vaughan Basta called asking to speak with Lauren regarding her mothers [place] report. Please return her call [phone].  CLINICAL DATA:  LEFT adnexal mass, cyst of LEFT ovary, prior hysterectomy  EXAM: TRANSABDOMINAL ULTRASOUND OF PELVIS  TECHNIQUE: Transabdominal ultrasound examination of the pelvis was performed including evaluation of the uterus, ovaries, adnexal regions, and pelvic cul-de-sac. Patient declined transvaginal imaging.  COMPARISON:  CT abdomen and pelvis 02/10/2018, pelvic ultrasound 03/25/2017  FINDINGS: Uterus  Surgically absent  Endometrium  N/A  Right ovary  Not visualized, likely obscured by bowel; normal sized RIGHT ovary with seen on the prior CT  Left ovary  Measurements: 5.7 x 5.5 x 4.1 cm = volume: 67 mL. Cystic lesion again identified within LEFT ovary 5.6 x 5.0 x 3.5 cm, previously 5.6 x 5.7 x 4.4 cm on CT. Dependent internal echogenic material without discrete mass.  Other findings:  No free pelvic fluid.  No other pelvic masses.  IMPRESSION: Surgical absence of the uterus with nonvisualization of RIGHT ovary.  Minimal decrease in size of a mildly complicated cystic lesion of the LEFT ovary since CT exam of 02/10/2018, containing dependent internal debris; continued follow-up recommended by ultrasound in 6 months to reassess stability.   Electronically Signed   By: Lavonia Dana M.D.   On: 03/29/2019 16:29

## 2019-04-08 NOTE — Telephone Encounter (Signed)
Spoke to [facility] with recommendations. Dr. Caryn Section will order f/u u/s in 6 months. If symptomatic or enlarging, patient to return to [facility] for re-evaluation, otherwise ok to follow up with PCP.

## 2019-04-13 DIAGNOSIS — M6281 Muscle weakness (generalized): Secondary | ICD-10-CM | POA: Diagnosis not present

## 2019-04-13 DIAGNOSIS — M533 Sacrococcygeal disorders, not elsewhere classified: Secondary | ICD-10-CM | POA: Diagnosis not present

## 2019-04-13 DIAGNOSIS — G8929 Other chronic pain: Secondary | ICD-10-CM | POA: Diagnosis not present

## 2019-04-20 DIAGNOSIS — G8929 Other chronic pain: Secondary | ICD-10-CM | POA: Diagnosis not present

## 2019-04-20 DIAGNOSIS — M533 Sacrococcygeal disorders, not elsewhere classified: Secondary | ICD-10-CM | POA: Diagnosis not present

## 2019-04-22 DIAGNOSIS — M533 Sacrococcygeal disorders, not elsewhere classified: Secondary | ICD-10-CM | POA: Diagnosis not present

## 2019-04-22 DIAGNOSIS — M6281 Muscle weakness (generalized): Secondary | ICD-10-CM | POA: Diagnosis not present

## 2019-04-22 DIAGNOSIS — M25561 Pain in right knee: Secondary | ICD-10-CM | POA: Diagnosis not present

## 2019-04-22 DIAGNOSIS — G8929 Other chronic pain: Secondary | ICD-10-CM | POA: Diagnosis not present

## 2019-04-22 DIAGNOSIS — Z96651 Presence of right artificial knee joint: Secondary | ICD-10-CM | POA: Diagnosis not present

## 2019-04-26 DIAGNOSIS — G8929 Other chronic pain: Secondary | ICD-10-CM | POA: Diagnosis not present

## 2019-04-26 DIAGNOSIS — M6281 Muscle weakness (generalized): Secondary | ICD-10-CM | POA: Diagnosis not present

## 2019-04-26 DIAGNOSIS — M533 Sacrococcygeal disorders, not elsewhere classified: Secondary | ICD-10-CM | POA: Diagnosis not present

## 2019-04-28 DIAGNOSIS — M533 Sacrococcygeal disorders, not elsewhere classified: Secondary | ICD-10-CM | POA: Diagnosis not present

## 2019-04-28 DIAGNOSIS — G8929 Other chronic pain: Secondary | ICD-10-CM | POA: Diagnosis not present

## 2019-04-28 DIAGNOSIS — M6281 Muscle weakness (generalized): Secondary | ICD-10-CM | POA: Diagnosis not present

## 2019-04-29 ENCOUNTER — Other Ambulatory Visit: Payer: Self-pay | Admitting: Urology

## 2019-04-29 DIAGNOSIS — N135 Crossing vessel and stricture of ureter without hydronephrosis: Secondary | ICD-10-CM

## 2019-05-05 DIAGNOSIS — G8929 Other chronic pain: Secondary | ICD-10-CM | POA: Diagnosis not present

## 2019-05-05 DIAGNOSIS — M533 Sacrococcygeal disorders, not elsewhere classified: Secondary | ICD-10-CM | POA: Diagnosis not present

## 2019-05-07 DIAGNOSIS — M6281 Muscle weakness (generalized): Secondary | ICD-10-CM | POA: Diagnosis not present

## 2019-05-07 DIAGNOSIS — M533 Sacrococcygeal disorders, not elsewhere classified: Secondary | ICD-10-CM | POA: Diagnosis not present

## 2019-05-07 DIAGNOSIS — G8929 Other chronic pain: Secondary | ICD-10-CM | POA: Diagnosis not present

## 2019-05-17 ENCOUNTER — Other Ambulatory Visit: Payer: Self-pay | Admitting: *Deleted

## 2019-05-17 ENCOUNTER — Other Ambulatory Visit: Payer: Self-pay

## 2019-05-17 ENCOUNTER — Other Ambulatory Visit: Payer: Medicare Other

## 2019-05-17 ENCOUNTER — Encounter
Admission: RE | Admit: 2019-05-17 | Discharge: 2019-05-17 | Disposition: A | Discharge status: Home / Self Care | Payer: Medicare Other | Source: Ambulatory Visit | Attending: Urology | Admitting: Urology

## 2019-05-17 DIAGNOSIS — R300 Dysuria: Secondary | ICD-10-CM

## 2019-05-17 DIAGNOSIS — N135 Crossing vessel and stricture of ureter without hydronephrosis: Secondary | ICD-10-CM

## 2019-05-17 DIAGNOSIS — Q6211 Congenital occlusion of ureteropelvic junction: Secondary | ICD-10-CM

## 2019-05-17 HISTORY — DX: Chronic obstructive pulmonary disease, unspecified: J44.9

## 2019-05-17 LAB — URINALYSIS, COMPLETE
Bilirubin, UA: NEGATIVE
Glucose, UA: NEGATIVE
Ketones, UA: NEGATIVE
Nitrite, UA: POSITIVE — AB
Protein,UA: NEGATIVE
Specific Gravity, UA: 1.015 (ref 1.005–1.030)
Urobilinogen, Ur: 0.2 mg/dL (ref 0.2–1.0)
pH, UA: 7 (ref 5.0–7.5)

## 2019-05-17 LAB — MICROSCOPIC EXAMINATION
RBC, Urine: >30 /hpf — AB (ref 0–2)
WBC, UA: >30 /hpf — AB (ref 0–5)

## 2019-05-17 NOTE — Patient Instructions (Signed)
Your procedure is scheduled on: Friday 05/21/19.  Report to DAY SURGERY DEPARTMENT LOCATED ON 2ND FLOOR MEDICAL MALL ENTRANCE. To find out your arrival time please call [phone] between 1PM - 3PM on Thursday 05/20/19.   Remember: Instructions that are not followed completely may result in serious medical risk, up to and including death, or upon the discretion of your surgeon and anesthesiologist your surgery may need to be rescheduled.      _X__ 1. Do not eat food after midnight the night before your procedure.                 No gum chewing or hard candies. You may drink clear liquids up to 2 hours                 before you are scheduled to arrive for your surgery- DO NOT drink clear                 liquids within 2 hours of the start of your surgery.                 Clear Liquids include:  water, apple juice without pulp, clear carbohydrate                 drink such as Clearfast or Gatorade, Black Coffee or Tea (Do not add                 anything to coffee or tea).   __X__2.  On the morning of surgery brush your teeth with toothpaste and water, you may rinse your mouth with mouthwash if you wish.  Do not swallow any toothpaste or mouthwash.      _X__ 3.  No Alcohol for 24 hours before or after surgery.    __X__4.  Notify your doctor if there is any change in your medical condition      (cold, fever, infections).      Do not wear jewelry, make-up, hairpins, clips or nail polish. Do not wear lotions, powders, or perfumes.  Do not shave 48 hours prior to surgery. Men may shave face and neck. Do not bring valuables to the hospital.     [facility] is not responsible for any belongings or valuables.   Contacts, dentures/partials or body piercings may not be worn into surgery. Bring a case for your contacts, glasses or hearing aids, a denture cup will be supplied.    Patients discharged the day of surgery will not be allowed to drive home.   __X__ Take these medicines  the morning of surgery with A SIP OF WATER:     1. COMBIVENT RESPIMAT 20-100 MCG/ACT AERS respimat  2. acetaminophen (TYLENOL) 500 MG tablet if needed     __X__ Use inhalers on the day of surgery. Also bring the inhaler with you to the hospital on the morning of surgery.   __X__ Stop Anti-inflammatories 7 days before surgery such as Advil, Ibuprofen, Motrin, BC or Goodies Powder, Naprosyn, Naproxen, Aleve, Aspirin, Meloxicam. May take Tylenol if needed for pain or discomfort.    __X__ Don't start taking any new herbal supplements or vitamins prior to your surgery.

## 2019-05-17 NOTE — Addendum Note (Signed)
Addended by: Verlene Mayer A on: 05/17/2019 08:29 AM   Modules accepted: Orders

## 2019-05-18 ENCOUNTER — Telehealth: Payer: Self-pay

## 2019-05-18 DIAGNOSIS — Z87440 Personal history of urinary (tract) infections: Secondary | ICD-10-CM

## 2019-05-18 DIAGNOSIS — N390 Urinary tract infection, site not specified: Secondary | ICD-10-CM

## 2019-05-18 MED ORDER — CIPROFLOXACIN HCL 500 MG PO TABS: 500.0000 mg | ORAL_TABLET | Freq: Two times a day (BID) | ORAL | 0 refills | Status: DC

## 2019-05-18 NOTE — Telephone Encounter (Signed)
Called pt informed her of the information below. Pt gave verbal understanding. RX sent.  

## 2019-05-18 NOTE — Telephone Encounter (Signed)
-----   Message from Billey Co, MD sent at 05/18/2019  8:12 AM EDT ----- Charlott Rakes- please start Cipro 500mg  BID x 5 days to sterilize urine before surgery this Friday  Leah- FYI  Thanks,  Nickolas Madrid, MD 05/18/2019

## 2019-05-19 ENCOUNTER — Other Ambulatory Visit: Payer: Self-pay

## 2019-05-19 ENCOUNTER — Other Ambulatory Visit
Admission: RE | Admit: 2019-05-19 | Discharge: 2019-05-19 | Disposition: A | Discharge status: Home / Self Care | Payer: Medicare Other | Source: Ambulatory Visit | Attending: Urology | Admitting: Urology

## 2019-05-19 DIAGNOSIS — Z20822 Contact with and (suspected) exposure to covid-19: Secondary | ICD-10-CM | POA: Diagnosis not present

## 2019-05-19 DIAGNOSIS — Z01812 Encounter for preprocedural laboratory examination: Secondary | ICD-10-CM | POA: Insufficient documentation

## 2019-05-19 LAB — BASIC METABOLIC PANEL
Anion gap: 8 (ref 5–15)
BUN: 20 mg/dL (ref 8–23)
CO2: 27 mmol/L (ref 22–32)
Calcium: 8.8 mg/dL — ABNORMAL LOW (ref 8.9–10.3)
Chloride: 98 mmol/L (ref 98–111)
Creatinine, Ser: 0.81 mg/dL (ref 0.44–1.00)
GFR calc Af Amer: >60 mL/min (ref >60)
GFR calc non Af Amer: >60 mL/min (ref >60)
Glucose, Bld: 93 mg/dL (ref 70–99)
Potassium: 4.2 mmol/L (ref 3.5–5.1)
Sodium: 133 mmol/L — ABNORMAL LOW (ref 135–145)

## 2019-05-19 LAB — CBC
HCT: 39.9 % (ref 36.0–46.0)
Hemoglobin: 13 g/dL (ref 12.0–15.0)
MCH: 31.3 pg (ref 26.0–34.0)
MCHC: 32.6 g/dL (ref 30.0–36.0)
MCV: 96.1 fL (ref 80.0–100.0)
Platelets: 312 10*3/uL (ref 150–400)
RBC: 4.15 MIL/uL (ref 3.87–5.11)
RDW: 13.7 % (ref 11.5–15.5)
WBC: 7.1 10*3/uL (ref 4.0–10.5)
nRBC: 0 % (ref 0.0–0.2)

## 2019-05-19 LAB — SARS CORONAVIRUS 2 (TAT 6-24 HRS): SARS Coronavirus 2: NEGATIVE

## 2019-05-20 DIAGNOSIS — M533 Sacrococcygeal disorders, not elsewhere classified: Secondary | ICD-10-CM | POA: Diagnosis not present

## 2019-05-20 DIAGNOSIS — G8929 Other chronic pain: Secondary | ICD-10-CM | POA: Diagnosis not present

## 2019-05-20 DIAGNOSIS — M6281 Muscle weakness (generalized): Secondary | ICD-10-CM | POA: Diagnosis not present

## 2019-05-21 ENCOUNTER — Ambulatory Visit
Admission: RE | Admit: 2019-05-21 | Discharge: 2019-05-21 | Disposition: A | Discharge status: Home / Self Care | Payer: Medicare Other | Attending: Urology | Admitting: Urology

## 2019-05-21 ENCOUNTER — Encounter: Payer: Self-pay | Admitting: Urology

## 2019-05-21 ENCOUNTER — Other Ambulatory Visit: Payer: Self-pay

## 2019-05-21 ENCOUNTER — Ambulatory Visit: Payer: Medicare Other

## 2019-05-21 ENCOUNTER — Ambulatory Visit: Payer: Medicare Other | Admitting: Anesthesiology

## 2019-05-21 ENCOUNTER — Encounter
Admission: RE | Disposition: A | Discharge status: Home / Self Care | Payer: Self-pay | Source: Home / Self Care | Attending: Urology

## 2019-05-21 DIAGNOSIS — Z9841 Cataract extraction status, right eye: Secondary | ICD-10-CM | POA: Diagnosis not present

## 2019-05-21 DIAGNOSIS — Z96652 Presence of left artificial knee joint: Secondary | ICD-10-CM | POA: Diagnosis not present

## 2019-05-21 DIAGNOSIS — Z90710 Acquired absence of both cervix and uterus: Secondary | ICD-10-CM | POA: Diagnosis not present

## 2019-05-21 DIAGNOSIS — M1990 Unspecified osteoarthritis, unspecified site: Secondary | ICD-10-CM | POA: Insufficient documentation

## 2019-05-21 DIAGNOSIS — Z8572 Personal history of non-Hodgkin lymphomas: Secondary | ICD-10-CM | POA: Diagnosis not present

## 2019-05-21 DIAGNOSIS — Z9842 Cataract extraction status, left eye: Secondary | ICD-10-CM | POA: Insufficient documentation

## 2019-05-21 DIAGNOSIS — Z87891 Personal history of nicotine dependence: Secondary | ICD-10-CM | POA: Diagnosis not present

## 2019-05-21 DIAGNOSIS — K449 Diaphragmatic hernia without obstruction or gangrene: Secondary | ICD-10-CM | POA: Insufficient documentation

## 2019-05-21 DIAGNOSIS — J449 Chronic obstructive pulmonary disease, unspecified: Secondary | ICD-10-CM | POA: Diagnosis not present

## 2019-05-21 DIAGNOSIS — Z820 Family history of epilepsy and other diseases of the nervous system: Secondary | ICD-10-CM | POA: Diagnosis not present

## 2019-05-21 DIAGNOSIS — Z87440 Personal history of urinary (tract) infections: Secondary | ICD-10-CM | POA: Diagnosis not present

## 2019-05-21 DIAGNOSIS — K219 Gastro-esophageal reflux disease without esophagitis: Secondary | ICD-10-CM | POA: Diagnosis not present

## 2019-05-21 DIAGNOSIS — K635 Polyp of colon: Secondary | ICD-10-CM | POA: Diagnosis not present

## 2019-05-21 DIAGNOSIS — N135 Crossing vessel and stricture of ureter without hydronephrosis: Secondary | ICD-10-CM | POA: Diagnosis not present

## 2019-05-21 DIAGNOSIS — Z803 Family history of malignant neoplasm of breast: Secondary | ICD-10-CM | POA: Diagnosis not present

## 2019-05-21 DIAGNOSIS — M1711 Unilateral primary osteoarthritis, right knee: Secondary | ICD-10-CM | POA: Diagnosis not present

## 2019-05-21 DIAGNOSIS — Z806 Family history of leukemia: Secondary | ICD-10-CM | POA: Insufficient documentation

## 2019-05-21 DIAGNOSIS — Z8249 Family history of ischemic heart disease and other diseases of the circulatory system: Secondary | ICD-10-CM | POA: Insufficient documentation

## 2019-05-21 DIAGNOSIS — Z9221 Personal history of antineoplastic chemotherapy: Secondary | ICD-10-CM | POA: Diagnosis not present

## 2019-05-21 DIAGNOSIS — Z87442 Personal history of urinary calculi: Secondary | ICD-10-CM | POA: Diagnosis not present

## 2019-05-21 DIAGNOSIS — R0602 Shortness of breath: Secondary | ICD-10-CM | POA: Diagnosis not present

## 2019-05-21 DIAGNOSIS — E039 Hypothyroidism, unspecified: Secondary | ICD-10-CM | POA: Diagnosis not present

## 2019-05-21 HISTORY — PX: CYSTOSCOPY W/ URETERAL STENT PLACEMENT: SHX1429

## 2019-05-21 LAB — CULTURE, URINE COMPREHENSIVE

## 2019-05-21 SURGERY — CYSTOSCOPY, FLEXIBLE, WITH STENT REPLACEMENT
Anesthesia: General | Laterality: Left

## 2019-05-21 MED ORDER — LIDOCAINE HCL (CARDIAC) PF 100 MG/5ML IV SOSY
PREFILLED_SYRINGE | INTRAVENOUS | Status: DC | PRN
  Administered 2019-05-21: 100 mg via INTRAVENOUS

## 2019-05-21 MED ORDER — FENTANYL CITRATE (PF) 100 MCG/2ML IJ SOLN: 25.0000 ug | INTRAMUSCULAR | Status: DC | PRN

## 2019-05-21 MED ORDER — PROPOFOL 10 MG/ML IV BOLUS
INTRAVENOUS | Status: AC
  Filled 2019-05-21: qty 40

## 2019-05-21 MED ORDER — OXYCODONE HCL 5 MG/5ML PO SOLN: 5.0000 mg | Freq: Once | ORAL | Status: DC | PRN

## 2019-05-21 MED ORDER — IOHEXOL 180 MG/ML  SOLN
INTRAMUSCULAR | Status: DC | PRN
  Administered 2019-05-21: 8 mL

## 2019-05-21 MED ORDER — PROPOFOL 10 MG/ML IV BOLUS
INTRAVENOUS | Status: DC | PRN
  Administered 2019-05-21: 75 ug/kg/min via INTRAVENOUS

## 2019-05-21 MED ORDER — DEXAMETHASONE SODIUM PHOSPHATE 10 MG/ML IJ SOLN
INTRAMUSCULAR | Status: DC | PRN
  Administered 2019-05-21: 10 mg via INTRAVENOUS

## 2019-05-21 MED ORDER — OXYCODONE HCL 5 MG PO TABS: 5.0000 mg | ORAL_TABLET | Freq: Once | ORAL | Status: DC | PRN

## 2019-05-21 MED ORDER — LACTATED RINGERS IV SOLN: INTRAVENOUS | Status: DC

## 2019-05-21 MED ORDER — FENTANYL CITRATE (PF) 100 MCG/2ML IJ SOLN
INTRAMUSCULAR | Status: DC | PRN
  Administered 2019-05-21 (×2): 25 ug via INTRAVENOUS

## 2019-05-21 MED ORDER — FAMOTIDINE 20 MG PO TABS: 20.0000 mg | ORAL_TABLET | Freq: Once | ORAL | Status: AC

## 2019-05-21 MED ORDER — SODIUM CHLORIDE 0.9 % IV SOLN
1.0000 g | INTRAVENOUS | Status: AC
  Administered 2019-05-21: 1 g via INTRAVENOUS
  Filled 2019-05-21: qty 1

## 2019-05-21 MED ORDER — FAMOTIDINE 20 MG PO TABS
ORAL_TABLET | ORAL | Status: AC
  Administered 2019-05-21: 20 mg via ORAL
  Filled 2019-05-21: qty 1

## 2019-05-21 MED ORDER — FENTANYL CITRATE (PF) 100 MCG/2ML IJ SOLN
INTRAMUSCULAR | Status: AC
  Filled 2019-05-21: qty 2

## 2019-05-21 MED ORDER — ONDANSETRON HCL 4 MG/2ML IJ SOLN
INTRAMUSCULAR | Status: DC | PRN
  Administered 2019-05-21: 4 mg via INTRAVENOUS

## 2019-05-21 MED ORDER — CEFEPIME HCL 1 G IV SOLR
1.0000 g | INTRAVENOUS | Status: DC
  Filled 2019-05-21: qty 1

## 2019-05-21 MED ORDER — PROPOFOL 10 MG/ML IV BOLUS
INTRAVENOUS | Status: AC
  Filled 2019-05-21: qty 20

## 2019-05-21 SURGICAL SUPPLY — 26 items
BAG DRAIN CYSTO-URO LG1000N (MISCELLANEOUS) ×3 IMPLANT
BAG URINE DRAIN 2000ML AR STRL (UROLOGICAL SUPPLIES) ×2 IMPLANT
BRUSH SCRUB EZ  4% CHG (MISCELLANEOUS)
BRUSH SCRUB EZ 4% CHG (MISCELLANEOUS) IMPLANT
CATH FOL 2WAY LX 16X5 (CATHETERS) ×2 IMPLANT
CATH URETL 5X70 OPEN END (CATHETERS) ×3 IMPLANT
CONRAY 43 FOR UROLOGY 50M (MISCELLANEOUS) ×3 IMPLANT
GLOVE BIOGEL PI IND STRL 7.5 (GLOVE) ×1 IMPLANT
GLOVE BIOGEL PI INDICATOR 7.5 (GLOVE) ×2
GOWN STRL REUS W/ TWL LRG LVL3 (GOWN DISPOSABLE) ×1 IMPLANT
GOWN STRL REUS W/ TWL XL LVL3 (GOWN DISPOSABLE) ×1 IMPLANT
GOWN STRL REUS W/TWL LRG LVL3 (GOWN DISPOSABLE) ×2
GOWN STRL REUS W/TWL XL LVL3 (GOWN DISPOSABLE) ×2
GUIDEWIRE STR DUAL SENSOR (WIRE) ×3 IMPLANT
HOLDER FOLEY CATH W/STRAP (MISCELLANEOUS) ×2 IMPLANT
KIT TURNOVER CYSTO (KITS) ×3 IMPLANT
PACK CYSTO AR (MISCELLANEOUS) ×3 IMPLANT
SET CYSTO W/LG BORE CLAMP LF (SET/KITS/TRAYS/PACK) ×3 IMPLANT
SOL .9 NS 3000ML IRR  AL (IV SOLUTION) ×2
SOL .9 NS 3000ML IRR UROMATIC (IV SOLUTION) ×1 IMPLANT
STENT URET 6FRX24 CONTOUR (STENTS) IMPLANT
STENT URET 6FRX26 CONTOUR (STENTS) IMPLANT
STENT URO INLAY 6FRX24CM (STENTS) ×2 IMPLANT
SURGILUBE 2OZ TUBE FLIPTOP (MISCELLANEOUS) ×3 IMPLANT
SYRINGE IRR TOOMEY STRL 70CC (SYRINGE) IMPLANT
WATER STERILE IRR 1000ML POUR (IV SOLUTION) ×3 IMPLANT

## 2019-05-21 NOTE — Anesthesia Postprocedure Evaluation (Signed)
Anesthesia Post Note  Patient: Monica Singh  Procedure(s) Performed: CYSTOSCOPY WITH STENT REPLACEMENT (Left )  Patient location during evaluation: PACU Anesthesia Type: General Level of consciousness: awake and alert Pain management: pain level controlled Vital Signs Assessment: post-procedure vital signs reviewed and stable Respiratory status: spontaneous breathing, nonlabored ventilation, respiratory function stable and patient connected to nasal cannula oxygen Cardiovascular status: blood pressure returned to baseline and stable Postop Assessment: no apparent nausea or vomiting Anesthetic complications: no     Last Vitals:  Vitals:   05/21/19 0833 05/21/19 0848  BP: (!) 142/70 133/76  Pulse: 82 83  Resp: 18 18  Temp: 36.6 C   SpO2: 99% 98%    Last Pain:  Vitals:   05/21/19 0833  TempSrc: Temporal  PainSc: 0-No pain                 Precious Haws Isak Sotomayor

## 2019-05-21 NOTE — Op Note (Signed)
Date of procedure: 05/21/19  Preoperative diagnosis:  1. Left chronic UPJ obstruction  Postoperative diagnosis:  1. Same  Procedure: 1. Cystoscopy, left retrograde pyelogram with intraoperative interpretation, left ureteral stent exchange  Surgeon: Nickolas Madrid, MD  Anesthesia: General  Complications: None  Intraoperative findings:  1.  Normal cystoscopy, no encrustation of stent 2.  Left retrograde pyelogram with minimal collecting system dilation 3.  Uncomplicated stent change  EBL: None  Specimens: None  Drains: Left 6 French by 24 cm Bard Optima stent  Indication: Monica Singh is a 86 y.o. patient with left UPJ obstruction managed with chronic stent changes.  After reviewing the management options for treatment, they elected to proceed with the above surgical procedure(s). We have discussed the potential benefits and risks of the procedure, side effects of the proposed treatment, the likelihood of the patient achieving the goals of the procedure, and any potential problems that might occur during the procedure or recuperation. Informed consent has been obtained.  Description of procedure:  The patient was taken to the operating room and MAC was induced. SCDs were placed for DVT prophylaxis. The patient was placed in the dorsal lithotomy position, prepped and draped in the usual sterile fashion, and preoperative antibiotics(cefepime) were administered. A preoperative time-out was performed.   A 21 French rigid cystoscope was used to intubate the urethra.  Cystoscopy was performed and was grossly normal.  A sensor wire was advanced alongside the left ureteral stent up to the kidney under fluoroscopic vision.  The old stent was grasped and removed.  There was no encrustation of the stent.  A 5 French access catheter was then advanced alongside the wire up to the proximal ureter, and a retrograde pyelogram showed only minimal dilation of the collecting system and no  extravasation.  The 5 [place] access catheter was removed, and a Monica 6 [place] by 24 cm Bard Optima stent was uneventfully placed over the wire fluoroscopically with an excellent curl in the renal pelvis, as well as in the bladder.  On cystoscopy, contrast was seen to drain briskly through the side ports of the stent.  A Foley was placed to maximize drainage in the postop setting and minimize risk of infection.  Disposition: Stable to PACU  Plan: Remove Foley prior to discharge Follow-up in clinic in 6 months for symptom check, plan for yearly stent changes  Nickolas Madrid, MD

## 2019-05-21 NOTE — OR Nursing (Signed)
Discharge pending transportation 

## 2019-05-21 NOTE — Discharge Instructions (Signed)

## 2019-05-21 NOTE — Transfer of Care (Signed)
Immediate Anesthesia Transfer of Care Note  Patient: Monica Singh  Procedure(s) Performed: CYSTOSCOPY WITH STENT REPLACEMENT (Left )  Patient Location: PACU  Anesthesia Type:General  Level of Consciousness: awake, alert  and oriented  Airway & Oxygen Therapy: Patient Spontanous Breathing  Post-op Assessment: Report given to RN  Post vital signs: Reviewed  Last Vitals:  Vitals Value Taken Time  BP 124/68 05/21/19 0802  Temp    Pulse 79 05/21/19 0802  Resp 13 05/21/19 0802  SpO2 100 % 05/21/19 0802  Vitals shown include unvalidated device data.  Last Pain:  Vitals:   05/21/19 0637  TempSrc: Tympanic  PainSc: 0-No pain         Complications: No apparent anesthesia complications

## 2019-05-21 NOTE — H&P (Signed)
05/21/19 7:10 AM   Monica Singh Mar 18, 1928 [id number]  CC: Left UPJ obstruction  HPI:  Monica Singh is an 86 yo F with left chronic UPJ obstruction managed with an indwelling chronic ureteral stent.  Briefly, she is a very nice 86 year old female who originally presented in the fall 2019 with recurrent urinary tract infections, severe left-sided flank pain, and was ultimately found to have new onset of left idiopathic UPJ obstruction with moderate to severe upstream hydronephrosis.  Diagnostic ureteroscopy in January 2020 did not show any concerning lesions and cytology was negative.  She underwent stent placement at that time with complete resolution of her left-sided pain.  She has had approximately 3 UTIs over the last year with the stent in place, and the stent was last changed on 07/2018.  Most recent imaging with a renal ultrasound in September 2020 showed no evidence of hydronephrosis.  She is not taking any prophylactic antibiotics at this time.    Most recent renal function is normal with a creatinine of 0.77, EGFR greater than 60 which is improved from prior to stent placement of creatinine 1.12, eGFR 44.  Overall, she feels she is doing very well and denies any complaints today.  She specifically denies any dysuria, flank pain, gross hematuria, fevers, or confusion.  Both she and her daughter are in agreement to continue management with a chronic ureteral stent.  We again discussed that this needs to be changed every 9 to 12 months.  PMH: Past Medical History:  Diagnosis Date  . Adnexal mass 06/03/2014   since 2009  . Arthritis   . Complication of anesthesia   . COPD (chronic obstructive pulmonary disease) ([place])   . Diffuse large cell lymphoma in remission ([place]) 01/2007   NON-HODGKINS-stage 3, cd 20 positive; status post 6 cycles of R-CHOP  . Dyspnea    with exertion  . GERD (gastroesophageal reflux disease)   . Herpes zoster without complication 03/28/4968  . History  of hiatal hernia   . History of kidney stones   . HOH (hard of hearing)    wears hearing aides  . Non Hodgkin's lymphoma ([place])   . Personal history of chemotherapy   . PONV (postoperative nausea and vomiting)    in January lasted about 4 hours  . Recurrent UTI     Surgical History: Past Surgical History:  Procedure Laterality Date  . ABDOMINAL SURGERY  2009   abdominal mass+ NH lymphoma,  . BACK SURGERY  01/2017   fusion. metal plate in neck at back  . CATARACT EXTRACTION  1997   right eye and left ey  . cervical neck fusion  1995  . CYSTOSCOPY W/ RETROGRADES Left 01/30/2018   Procedure: CYSTOSCOPY WITH RETROGRADE PYELOGRAM;  Surgeon: Billey Co, MD;  Location: [facility];  Service: Urology;  Laterality: Left;  . CYSTOSCOPY W/ RETROGRADES Left 08/05/2018   Procedure: CYSTOSCOPY WITH RETROGRADE PYELOGRAM;  Surgeon: Billey Co, MD;  Location: [facility];  Service: Urology;  Laterality: Left;  . CYSTOSCOPY W/ URETERAL STENT PLACEMENT Left 08/05/2018   Procedure: CYSTOSCOPY WITH STENT Exchange;  Surgeon: Billey Co, MD;  Location: [facility];  Service: Urology;  Laterality: Left;  . CYSTOSCOPY WITH BIOPSY Left 02/27/2018   Procedure: CYSTOSCOPY WITH BIOPSY;  Surgeon: Billey Co, MD;  Location: [facility];  Service: Urology;  Laterality: Left;  . CYSTOSCOPY WITH STENT PLACEMENT Left 01/30/2018   Procedure: CYSTOSCOPY WITH STENT PLACEMENT;  Surgeon: Billey Co, MD;  Location:  [facility];  Service: Urology;  Laterality: Left;  . CYSTOSCOPY WITH URETEROSCOPY AND STENT PLACEMENT Left 02/27/2018   Procedure: CYSTOSCOPY WITH URETEROSCOPY AND STENT Exchange;  Surgeon: Billey Co, MD;  Location: [facility];  Service: Urology;  Laterality: Left;  . DENTAL SURGERY     screws  . JOINT REPLACEMENT Left    knee  . KNEE ARTHROPLASTY Right 12/23/2018   Procedure: RIGHT COMPUTER ASSISTED TOTAL KNEE ARTHROPLASTY;  Surgeon: Dereck Leep, MD;  Location: [facility];  Service: Orthopedics;   Laterality: Right;  . KYPHOPLASTY N/A 02/21/2017   Procedure: [id number];  Surgeon: Hessie Knows, MD;  Location: [facility];  Service: Orthopedics;  Laterality: N/A;  . laparotomy with biopsy  03/02/2007  . PORTACATH PLACEMENT  2009  . [place]  . SQUAMOUS CELL CARCINOMA EXCISION     right arm  . TOTAL KNEE ARTHROPLASTY Left    2009  . URETEROSCOPY Left 01/30/2018   Procedure: URETEROSCOPY;  Surgeon: Billey Co, MD;  Location: [facility];  Service: Urology;  Laterality: Left;  [place] VAGINAL HYSTERECTOMY  1971    Family History: Family History  Problem Relation Age of Onset  . Breast cancer Sister   . Dementia Sister   . Cataracts Sister   . Heart attack Brother   . CAD Brother   . Heart attack Brother   . Leukemia Grandchild        granddaughter  . Kidney disease Neg Hx   . Bladder Cancer Neg Hx     Social History:  reports that she quit smoking about 29 years ago. Her smoking use included cigarettes. She smoked 0.25 packs per day. She has never used smokeless tobacco. She reports current alcohol use of about 14.0 standard drinks of alcohol per week. She reports that she does not use drugs.  Physical Exam: BP 129/65   Pulse 92   Temp 98.7 F (37.1 C) (Tympanic)   Resp 16   Ht '5\' 2"'$  (1.575 m)   Wt 64 kg   SpO2 98%   BMI 25.81 kg/m    Constitutional:  Alert and oriented, No acute distress. Cardiovascular:RRR. Respiratory: CTA b/l GI: Abdomen is soft, nontender, nondistended, no abdominal masses   Laboratory Data: Reviewed- urine cx with lactobacillus contaminant and mixed flora, has been on 4 days cipro for concerning UA  Assessment & Plan:   86 year old female with chronic left UPJ obstruction managed with stent changes every 9 to 12 months.  Risks and benefits discussed at length including bleeding, infection, inability to replace stent, stent related symptoms, sepsis, and recurrent UTIs.  Cystoscopy and left ureteral stent change today   Nickolas Madrid, MD 05/21/2019  [facility] [address] [place], [place] [address] [phone]

## 2019-05-21 NOTE — Anesthesia Preprocedure Evaluation (Signed)
Anesthesia Evaluation  Patient identified by MRN, date of birth, ID band Patient awake    Reviewed: Allergy & Precautions, H&P , NPO status , Patient's Chart, lab work & pertinent test results  History of Anesthesia Complications (+) PONV and history of anesthetic complications  Airway Mallampati: III  TM Distance: <3 FB Neck ROM: limited    Dental  (+) Chipped, Poor Dentition, Missing, Upper Dentures   Pulmonary shortness of breath, COPD, former smoker,           Cardiovascular Exercise Tolerance: Good (-) angina(-) Past MI and (-) DOE negative cardio ROS       Neuro/Psych negative neurological ROS  negative psych ROS   GI/Hepatic Neg liver ROS, hiatal hernia, GERD  Medicated and Controlled,  Endo/Other  Hypothyroidism   Renal/GU Renal disease     Musculoskeletal  (+) Arthritis ,   Abdominal   Peds  Hematology negative hematology ROS (+)   Anesthesia Other Findings Past Medical History: 06/03/2014: Adnexal mass     Comment:  since 2009 No date: Arthritis No date: Complication of anesthesia No date: COPD (chronic obstructive pulmonary disease) ([place]) 01/2007: 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)     Comment:  NON-HODGKINS-stage 3, cd 20 positive; status post 6               cycles of R-CHOP No date: Dyspnea     Comment:  with exertion No date: GERD (gastroesophageal reflux disease) 01/31/2009: Herpes zoster without complication No date: History of hiatal hernia No date: History of kidney stones No date: HOH (hard of hearing)     Comment:  wears hearing aides No date: Non Hodgkin's lymphoma ([place]) No date: Personal history of chemotherapy No date: PONV (postoperative nausea and vomiting)     Comment:  in January lasted about 4 hours No date: Recurrent UTI  Past Surgical History: 2009: ABDOMINAL SURGERY     Comment:  abdominal mass+ NH lymphoma, 01/2017: BACK SURGERY     Comment:  fusion. metal  plate in neck at back 1997: CATARACT EXTRACTION     Comment:  right eye and left ey 1995: cervical neck fusion 01/30/2018: CYSTOSCOPY W/ RETROGRADES; Left     Comment:  Procedure: CYSTOSCOPY WITH RETROGRADE PYELOGRAM;                Surgeon: Billey Co, MD;  Location: [facility];                Service: Urology;  Laterality: Left; 08/05/2018: CYSTOSCOPY W/ RETROGRADES; Left     Comment:  Procedure: CYSTOSCOPY WITH RETROGRADE PYELOGRAM;                Surgeon: Billey Co, MD;  Location: [facility];                Service: Urology;  Laterality: Left; 08/05/2018: CYSTOSCOPY W/ URETERAL STENT PLACEMENT; Left     Comment:  Procedure: CYSTOSCOPY WITH STENT Exchange;  Surgeon:               Billey Co, MD;  Location: [facility];  Service:               Urology;  Laterality: Left; 02/27/2018: CYSTOSCOPY WITH BIOPSY; Left     Comment:  Procedure: CYSTOSCOPY WITH BIOPSY;  Surgeon: Billey Co, MD;  Location: [facility];  Service: Urology;  Laterality: Left; 01/30/2018: [place]; Left     Comment:  Procedure: CYSTOSCOPY WITH STENT PLACEMENT;  Surgeon:               Billey Co, MD;  Location: [facility];  Service:               Urology;  Laterality: Left; 02/27/2018: CYSTOSCOPY WITH URETEROSCOPY AND STENT PLACEMENT; Left     Comment:  Procedure: CYSTOSCOPY WITH URETEROSCOPY AND STENT               Exchange;  Surgeon: Billey Co, MD;  Location: [facility]              [facility];  Service: Urology;  Laterality: Left; No date: DENTAL SURGERY     Comment:  screws No date: JOINT REPLACEMENT; Left     Comment:  knee 12/23/2018: KNEE ARTHROPLASTY; Right     Comment:  Procedure: RIGHT COMPUTER ASSISTED TOTAL KNEE               ARTHROPLASTY;  Surgeon: Dereck Leep, MD;  Location:               [facility];  Service: Orthopedics;  Laterality: Right; 02/21/2017: KYPHOPLASTY; N/A     Comment:  Procedure: [id number];  Surgeon: Hessie Knows, MD;                Location: [facility];  Service: Orthopedics;  Laterality:               N/A; 03/02/2007: laparotomy with biopsy 2009: [place]: SPINE SURGERY No date: SQUAMOUS CELL CARCINOMA EXCISION     Comment:  right arm No date: TOTAL KNEE ARTHROPLASTY; Left     Comment:  2009 01/30/2018: URETEROSCOPY; Left     Comment:  Procedure: URETEROSCOPY;  Surgeon: Billey Co, MD;              Location: [facility];  Service: Urology;  Laterality: Left; 1971: VAGINAL HYSTERECTOMY  BMI    Body Mass Index: 25.81 kg/m      Reproductive/Obstetrics negative OB ROS                             Anesthesia Physical Anesthesia Plan  ASA: III  Anesthesia Plan: General LMA   Post-op Pain Management:    Induction: Intravenous  PONV Risk Score and Plan: Dexamethasone, Ondansetron, Midazolam and Treatment may vary due to age or medical condition  Airway Management Planned: LMA  Additional Equipment:   Intra-op Plan:   Post-operative Plan: Extubation in OR  Informed Consent: I have reviewed the patients History and Physical, chart, labs and discussed the procedure including the risks, benefits and alternatives for the proposed anesthesia with the patient or authorized representative who has indicated his/her understanding and acceptance.     Dental Advisory Given  Plan Discussed with: Anesthesiologist, CRNA and Surgeon  Anesthesia Plan Comments: (Patient consented for risks of anesthesia including but not limited to:  - adverse reactions to medications - damage to eyes, teeth, lips or other oral mucosa - nerve damage due to positioning  - sore throat or hoarseness - Damage to heart, brain, nerves, lungs or loss of life  Patient voiced understanding.)        Anesthesia Quick Evaluation

## 2019-08-20 ENCOUNTER — Encounter: Payer: Self-pay | Admitting: Physician Assistant

## 2019-08-20 ENCOUNTER — Ambulatory Visit: Payer: Medicare Other | Admitting: Physician Assistant

## 2019-08-20 ENCOUNTER — Other Ambulatory Visit: Payer: Self-pay

## 2019-08-20 VITALS — BP 121/70 | HR 88 | Temp 97.8°F

## 2019-08-20 DIAGNOSIS — R300 Dysuria: Secondary | ICD-10-CM | POA: Diagnosis not present

## 2019-08-20 LAB — MICROSCOPIC EXAMINATION: WBC, UA: >30 /hpf — AB (ref 0–5)

## 2019-08-20 LAB — URINALYSIS, COMPLETE
Bilirubin, UA: NEGATIVE
Glucose, UA: NEGATIVE
Nitrite, UA: POSITIVE — AB
Specific Gravity, UA: 1.02 (ref 1.005–1.030)
Urobilinogen, Ur: 0.2 mg/dL (ref 0.2–1.0)
pH, UA: 6 (ref 5.0–7.5)

## 2019-08-20 LAB — BLADDER SCAN AMB NON-IMAGING: Scan Result: 16

## 2019-08-20 MED ORDER — CIPROFLOXACIN HCL 250 MG PO TABS
250.0000 mg | ORAL_TABLET | Freq: Two times a day (BID) | ORAL | 0 refills | Status: DC
Start: 2019-08-20 — End: 2019-08-20

## 2019-08-20 MED ORDER — CIPROFLOXACIN HCL 250 MG PO TABS: 250.0000 mg | ORAL_TABLET | Freq: Two times a day (BID) | ORAL | 0 refills | Status: AC

## 2019-08-20 NOTE — Progress Notes (Signed)
08/20/2019 2:03 PM   [place] August 07, 1928 [id number]  CC: Chief Complaint  Patient presents with  . Dysuria   HPI: [place] Monica Singh is a 86 y.o. female with PMH rUTI and left UPJ obstruction managed with indwelling ureteral stent, last exchanged by Dr. Diamantina Providence on 05/21/2019, who presents today for evaluation of possible UTI.   Today she reports a 1 day history of dysuria, frequency, and cloudy urine.  She denies fever, chills, nausea, and vomiting.  This represents her first UTI since her most recent stent exchange 3 months ago.  She is not on suppressive antibiotics.  In-office UA today positive for trace ketones, 3+ blood, 2+ protein, nitrites, and 3+ leukocyte esterase; urine microscopy with >30 WBCs/HPF, 11-30 RBCs/HPF, and many bacteria.  PVR 16 mL.  PMH: Past Medical History:  Diagnosis Date  . Adnexal mass 06/03/2014   since 2009  . Arthritis   . Complication of anesthesia   . COPD (chronic obstructive pulmonary disease) ([place])   . Diffuse large cell lymphoma in remission ([place]) 01/2007   NON-HODGKINS-stage 3, cd 20 positive; status post 6 cycles of R-CHOP  . Dyspnea    with exertion  . GERD (gastroesophageal reflux disease)   . Herpes zoster without complication 08/29/4233  . History of hiatal hernia   . History of kidney stones   . HOH (hard of hearing)    wears hearing aides  . Non Hodgkin's lymphoma ([place])   . Personal history of chemotherapy   . PONV (postoperative nausea and vomiting)    in January lasted about 4 hours  . Recurrent UTI     Surgical History: Past Surgical History:  Procedure Laterality Date  . ABDOMINAL SURGERY  2009   abdominal mass+ [place] lymphoma,  . BACK SURGERY  01/2017   fusion. metal plate in neck at back  . CATARACT EXTRACTION  1997   right eye and left ey  . cervical neck fusion  1995  . CYSTOSCOPY W/ RETROGRADES Left 01/30/2018   Procedure: CYSTOSCOPY WITH RETROGRADE PYELOGRAM;  Surgeon: Billey Co, MD;  Location:  [facility];  Service: Urology;  Laterality: Left;  . CYSTOSCOPY W/ RETROGRADES Left 08/05/2018   Procedure: CYSTOSCOPY WITH RETROGRADE PYELOGRAM;  Surgeon: Billey Co, MD;  Location: [facility];  Service: Urology;  Laterality: Left;  . CYSTOSCOPY W/ URETERAL STENT PLACEMENT Left 08/05/2018   Procedure: CYSTOSCOPY WITH STENT Exchange;  Surgeon: Billey Co, MD;  Location: [facility];  Service: Urology;  Laterality: Left;  . CYSTOSCOPY W/ URETERAL STENT PLACEMENT Left 05/21/2019   Procedure: CYSTOSCOPY WITH STENT REPLACEMENT;  Surgeon: Billey Co, MD;  Location: [facility];  Service: Urology;  Laterality: Left;  . CYSTOSCOPY WITH BIOPSY Left 02/27/2018   Procedure: CYSTOSCOPY WITH BIOPSY;  Surgeon: Billey Co, MD;  Location: [facility];  Service: Urology;  Laterality: Left;  . CYSTOSCOPY WITH STENT PLACEMENT Left 01/30/2018   Procedure: CYSTOSCOPY WITH STENT PLACEMENT;  Surgeon: Billey Co, MD;  Location: [facility];  Service: Urology;  Laterality: Left;  . CYSTOSCOPY WITH URETEROSCOPY AND STENT PLACEMENT Left 02/27/2018   Procedure: CYSTOSCOPY WITH URETEROSCOPY AND STENT Exchange;  Surgeon: Billey Co, MD;  Location: [facility];  Service: Urology;  Laterality: Left;  . DENTAL SURGERY     screws  . JOINT REPLACEMENT Left    knee  . KNEE ARTHROPLASTY Right 12/23/2018   Procedure: RIGHT COMPUTER ASSISTED TOTAL KNEE ARTHROPLASTY;  Surgeon: Dereck Leep, MD;  Location: [facility];  Service: Orthopedics;  Laterality: Right;  . KYPHOPLASTY N/A 02/21/2017   Procedure: [id number];  Surgeon: Hessie Knows, MD;  Location: [facility];  Service: Orthopedics;  Laterality: N/A;  . laparotomy with biopsy  03/02/2007  . PORTACATH PLACEMENT  2009  . [place]  . SQUAMOUS CELL CARCINOMA EXCISION     right arm  . TOTAL KNEE ARTHROPLASTY Left    2009  . URETEROSCOPY Left 01/30/2018   Procedure: URETEROSCOPY;  Surgeon: Billey Co, MD;  Location: [facility];  Service: Urology;   Laterality: Left;  Marland Kitchen VAGINAL HYSTERECTOMY  1971    Home Medications:  Allergies as of 08/20/2019      Reactions   Milk-related Compounds Diarrhea, Nausea And Vomiting   Gas too   Lac Bovis Diarrhea, Nausea And Vomiting   Milk allergy   Sulfa Antibiotics Hives, Itching      Medication List       Accurate as of August 20, 2019  2:03 PM. If you have any questions, ask your nurse or doctor.        acetaminophen 500 MG tablet Commonly known as: TYLENOL Take 1,000 mg by mouth every 6 (six) hours as needed for moderate pain.   ciprofloxacin 250 MG tablet Commonly known as: CIPRO Take 1 tablet (250 mg total) by mouth 2 (two) times daily for 7 days. Started by: Debroah Loop, [name]   Combivent Respimat 20-100 MCG/ACT Aers respimat Generic drug: Ipratropium-Albuterol INHALE 2 PUFFS INTO THE LUNGS 3 TIMES DAILY What changed: See the new instructions.      Allergies:  Allergies  Allergen Reactions  . Milk-Related Compounds Diarrhea and Nausea And Vomiting    Gas too  . Lac Bovis Diarrhea and Nausea And Vomiting    Milk allergy   . Sulfa Antibiotics Hives and Itching   Family History: Family History  Problem Relation Age of Onset  . Breast cancer Sister   . Dementia Sister   . Cataracts Sister   . Heart attack Brother   . CAD Brother   . Heart attack Brother   . Leukemia Grandchild        granddaughter  . Kidney disease Neg Hx   . Bladder Cancer Neg Hx    Social History:   reports that she quit smoking about 30 years ago. Her smoking use included cigarettes. She smoked 0.25 packs per day. She has never used smokeless tobacco. She reports current alcohol use of about 14.0 standard drinks of alcohol per week. She reports that she does not use drugs.  Physical Exam: BP 121/70   Pulse 88   Temp 97.8 F (36.6 C) (Oral)   Constitutional:  Alert and oriented, no acute distress, nontoxic appearing HEENT: [place], AT Cardiovascular: No clubbing, cyanosis, or edema  Respiratory: Normal respiratory effort, no increased work of breathing Skin: No rashes, bruises or suspicious lesions Neurologic: Grossly intact, no focal deficits, moving all 4 extremities Psychiatric: Normal mood and affect  Laboratory Data: Results for orders placed or performed in visit on 08/20/19  CULTURE, URINE COMPREHENSIVE   Specimen: Urine   UR  Result Value Ref Range   Urine Culture, Comprehensive Preliminary report (A)    Organism ID, Bacteria Gram negative rods (A)   Microscopic Examination   Urine  Result Value Ref Range   WBC, UA >30 (A) 0 - 5 /hpf   RBC 11-30 (A) 0 - 2 /hpf   Epithelial Cells (non renal) 0-10 0 - 10 /hpf   Casts Present (  A) None seen /lpf   Cast Type Hyaline casts N/A   Bacteria, UA Many (A) None seen/Few  Urinalysis, Complete  Result Value Ref Range   Specific Gravity, UA 1.020 1.005 - 1.030   pH, UA 6.0 5.0 - 7.5   Color, UA Yellow Yellow   Appearance Ur Cloudy (A) Clear   Leukocytes,UA 3+ (A) Negative   Protein,UA 2+ (A) Negative/Trace   Glucose, UA Negative Negative   Ketones, UA Trace (A) Negative   RBC, UA 3+ (A) Negative   Bilirubin, UA Negative Negative   Urobilinogen, Ur 0.2 0.2 - 1.0 mg/dL   Nitrite, UA Positive (A) Negative   Microscopic Examination See below:   Bladder Scan (Post Void Residual) in office  Result Value Ref Range   Scan Result 16    Assessment & Plan:   1. Dysuria UA grossly infected today, will start empiric Cipro x7 days due to indwelling stent and send for culture for further evaluation.  No further intervention needed for microscopic hematuria, which is stable and likely secondary to indwelling stent.  Counseled patient that I would contact her if her urine culture results indicate a necessary change in therapy.  She expressed understanding. - Urinalysis, Complete - Bladder Scan (Post Void Residual) in office - CULTURE, URINE COMPREHENSIVE - ciprofloxacin (CIPRO) 250 MG tablet; Take 1 tablet (250 mg total)  by mouth 2 (two) times daily for 7 days.  Dispense: 14 tablet; Refill: 0   Return if symptoms worsen or fail to improve.  Debroah Loop, [name]  [facility] [address] [place], [place] [address] [phone]

## 2019-08-24 LAB — CULTURE, URINE COMPREHENSIVE

## 2019-09-06 ENCOUNTER — Other Ambulatory Visit: Payer: Self-pay

## 2019-09-06 ENCOUNTER — Telehealth: Payer: Self-pay | Admitting: Radiology

## 2019-09-06 ENCOUNTER — Other Ambulatory Visit: Payer: Medicare Other

## 2019-09-06 DIAGNOSIS — N390 Urinary tract infection, site not specified: Secondary | ICD-10-CM | POA: Diagnosis not present

## 2019-09-06 LAB — MICROSCOPIC EXAMINATION: RBC, Urine: >30 /hpf — AB (ref 0–2)

## 2019-09-06 LAB — URINALYSIS, COMPLETE
Bilirubin, UA: NEGATIVE
Glucose, UA: NEGATIVE
Nitrite, UA: POSITIVE — AB
Specific Gravity, UA: 1.02 (ref 1.005–1.030)
Urobilinogen, Ur: 1 mg/dL (ref 0.2–1.0)
pH, UA: 7 (ref 5.0–7.5)

## 2019-09-06 MED ORDER — LEVOFLOXACIN 500 MG PO TABS: 500.0000 mg | ORAL_TABLET | Freq: Every day | ORAL | 0 refills | Status: DC

## 2019-09-06 NOTE — Progress Notes (Signed)
UA remains grossly infected today following inconsistent treatment for recent Citrobacter UTI, suggestive of persistent versus reinfection.  Given her difficulty remembering twice daily dosing, will prescribe Levaquin 500 mg daily x7 days (extended course due to indwelling stent).  I spoke with her daughter [name] via telephone this afternoon to inform her of the above. She expressed understanding.

## 2019-09-06 NOTE — Telephone Encounter (Signed)
Mrs. Pepin daughter, [name], says patient has a back ache and dark urine.Patient has taken all of the Cipro that was prescribed for a UTI but has missed several doses as she forgets to take it at night. Patient collected a urine specimen at home but it wasn't a clean catch. Daughter opted to help patient collect a clean catch specimen and bring to the office. This is acceptable per Debroah Loop, [name].

## 2019-09-10 ENCOUNTER — Telehealth: Payer: Self-pay | Admitting: Urology

## 2019-09-10 LAB — CULTURE, URINE COMPREHENSIVE

## 2019-09-10 MED ORDER — AMOXICILLIN-POT CLAVULANATE 875-125 MG PO TABS
1.0000 | ORAL_TABLET | Freq: Two times a day (BID) | ORAL | 0 refills | Status: DC
Start: 2019-09-10 — End: 2019-12-16

## 2019-09-10 NOTE — Telephone Encounter (Signed)
Would you send in Augmentin 875/125, BID x 7 days for her?

## 2019-09-10 NOTE — Telephone Encounter (Signed)
Left patient a VM, sent in RX. Asked to return call with any questions.

## 2019-09-24 IMAGING — US US PELVIS COMPLETE
1 series · 14 of 25 positions shown · non-contrast
Comparison: MRI lumbar spine 02/20/2017. Ultrasound pelvis
07/17/2016.

CLINICAL DATA: Complex adnexal mass seen on MRI.

EXAM:
TRANSABDOMINAL ULTRASOUND OF PELVIS
TECHNIQUE: Transabdominal ultrasound examination of the pelvis was performed
including evaluation of the uterus, ovaries, adnexal regions, and
pelvic cul-de-sac.

[Series 1: us pelvis complete · 0.24mm/px · 14 of 33 slices shown]
[im 1/33]
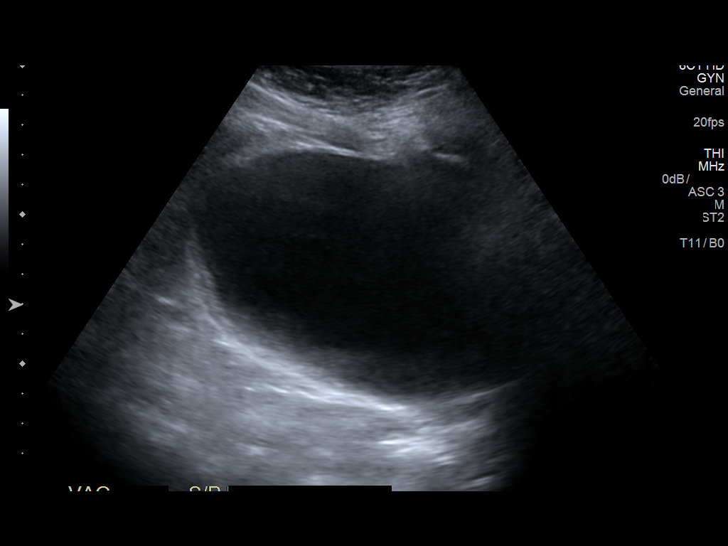
[im 3/33]
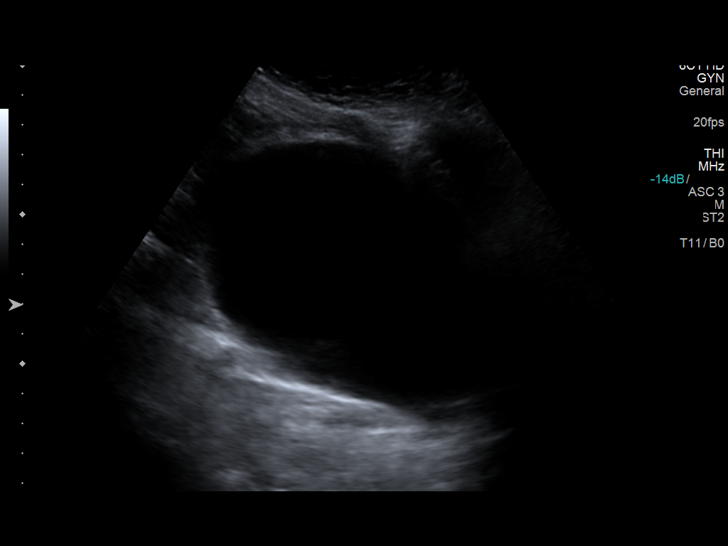
[im 6/33]
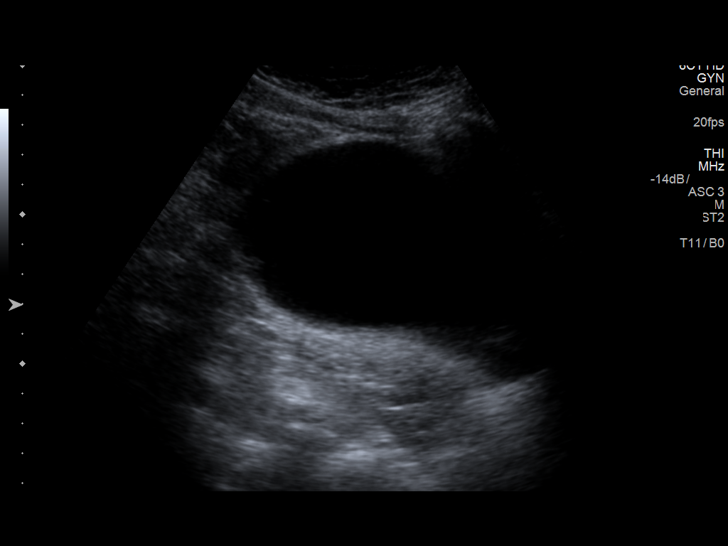
[im 9/33]
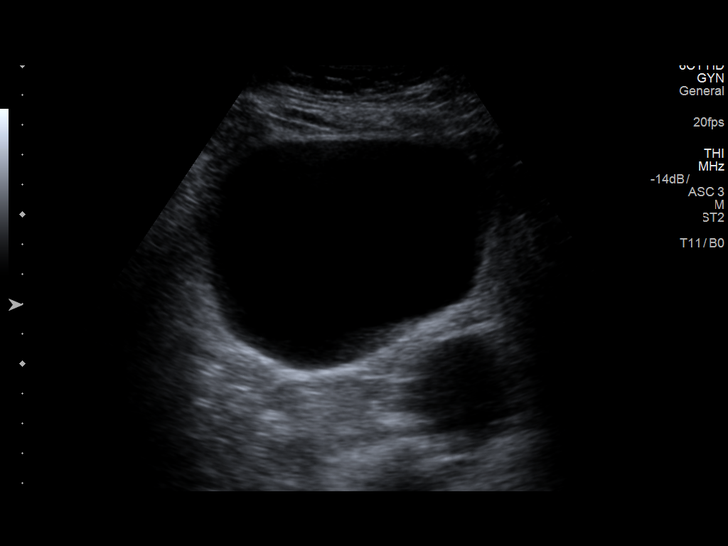
[im 11/33]
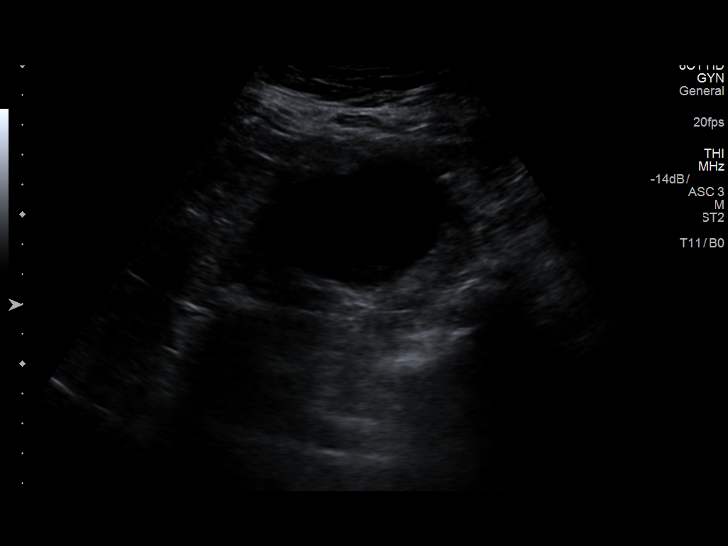
[im 13/33]
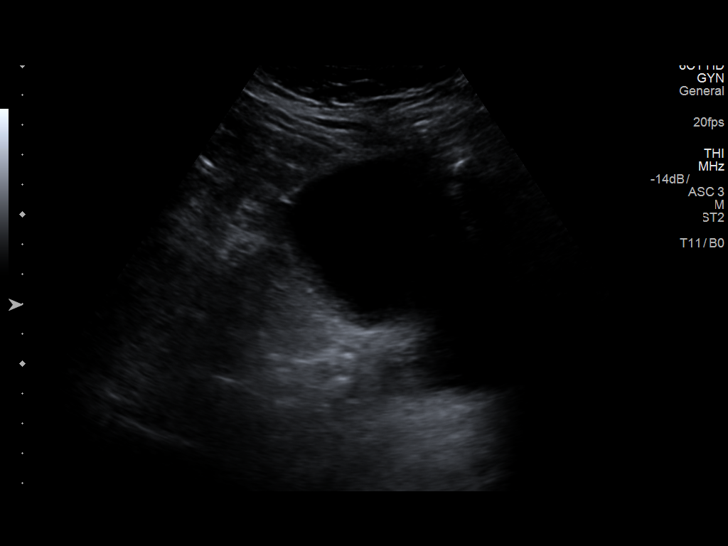
[im 15/33]
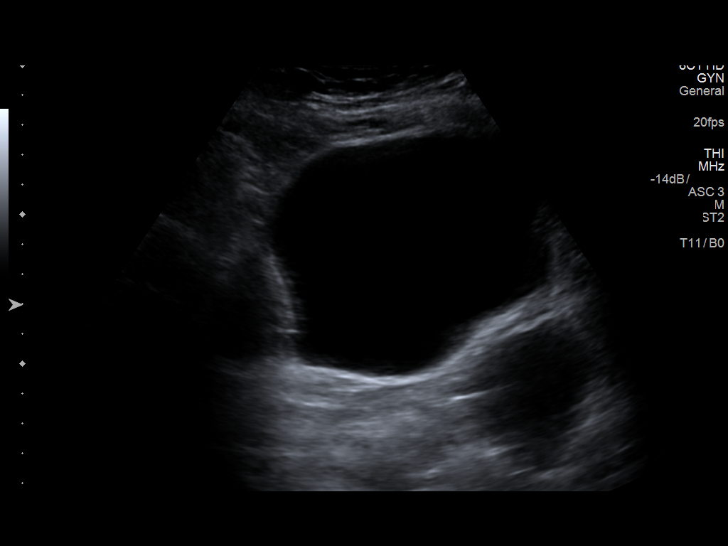
[im 18/33]
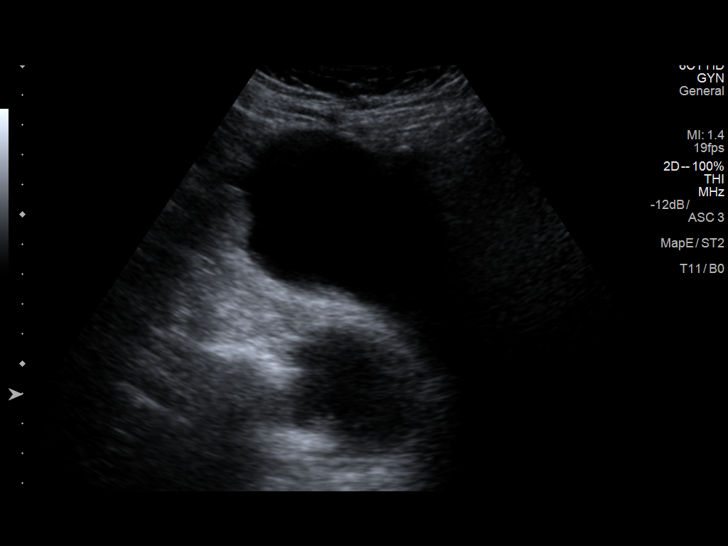
[im 21/33]
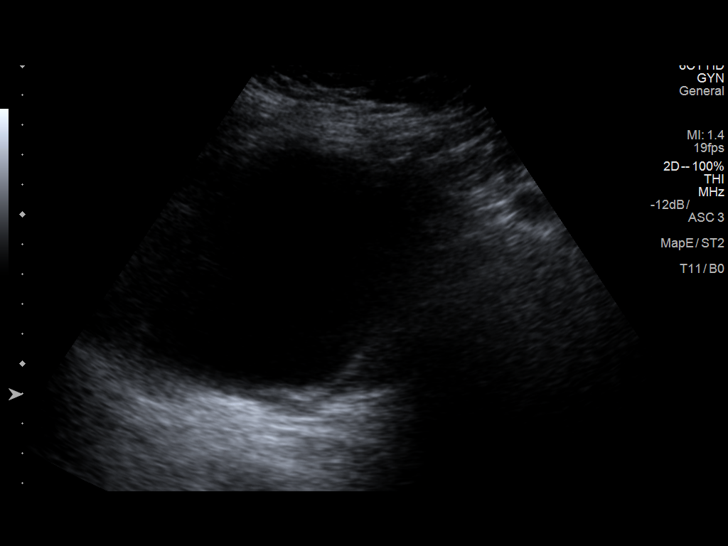
[im 22/33]
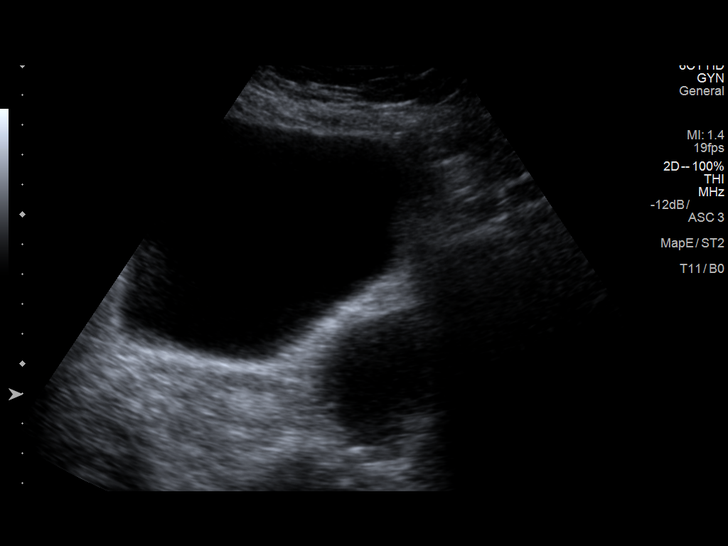
[im 25/33]
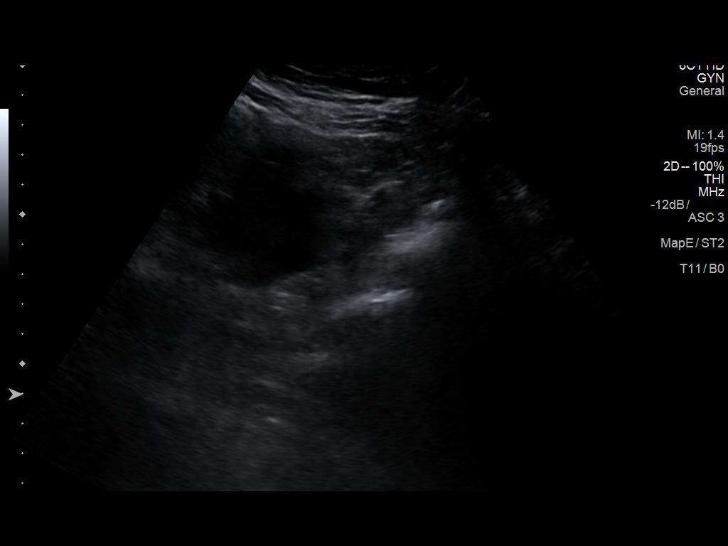
[im 27/33]
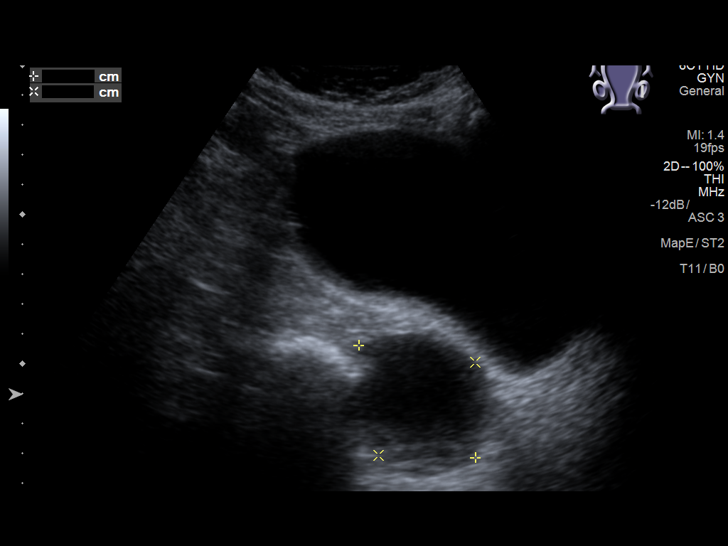
[im 30/33]
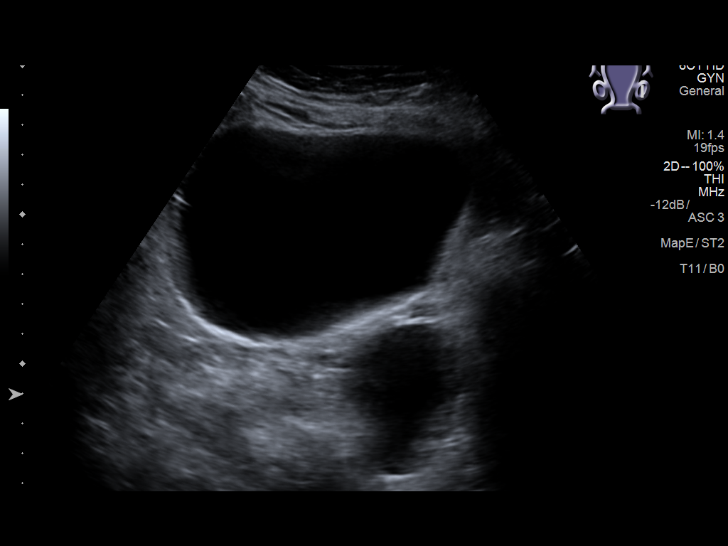
[im 33/33]
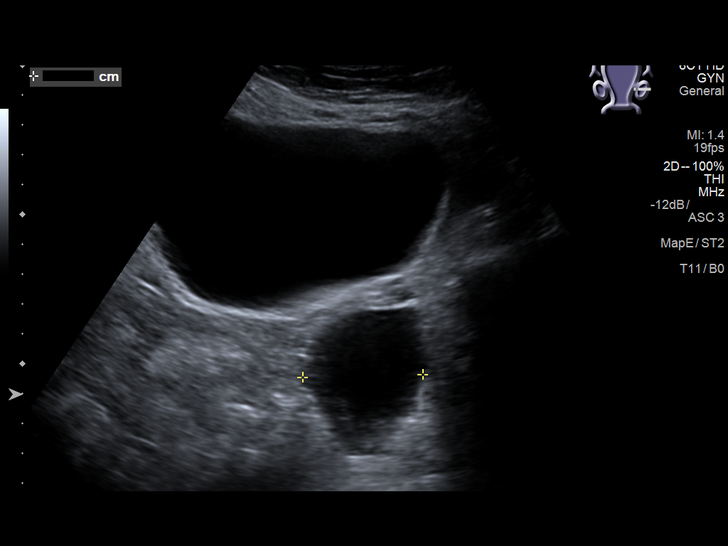

[14 of 25 positions shown; findings below may reference images not displayed]

FINDINGS: Uterus

Hysterectomy.

Right ovary

Not visualized

Left ovary

Not well visualized. A 4.2 x 3.6 x 2.7 cm cyst, most likely simple,
is again noted in the left adnexal

Other findings:  No abnormal free fluid.
IMPRESSION: A 4.2 x 3.6 x 2.7 cm cyst, most likely simple, is again noted in the
left adnexal region. Similar finding noted on prior exam. No
significant interim change. This is almost certainly benign, but
follow up ultrasound is recommended in 1 year according to the
Society of Radiologists in WltrasoundW0R0 Consensus Conference
Statement (Ramadimetsa Sophy et al. Management of Asymptomatic Ovarian and
Other Adnexal Cysts Imaged at US: Society of Radiologists in

## 2019-10-03 DIAGNOSIS — R112 Nausea with vomiting, unspecified: Secondary | ICD-10-CM | POA: Diagnosis not present

## 2019-10-03 DIAGNOSIS — R42 Dizziness and giddiness: Secondary | ICD-10-CM | POA: Diagnosis not present

## 2019-10-03 DIAGNOSIS — J449 Chronic obstructive pulmonary disease, unspecified: Secondary | ICD-10-CM | POA: Diagnosis not present

## 2019-10-03 DIAGNOSIS — Z87891 Personal history of nicotine dependence: Secondary | ICD-10-CM | POA: Diagnosis not present

## 2019-10-03 DIAGNOSIS — N3090 Cystitis, unspecified without hematuria: Secondary | ICD-10-CM | POA: Diagnosis not present

## 2019-10-12 ENCOUNTER — Telehealth: Payer: Self-pay | Admitting: Family Medicine

## 2019-10-12 DIAGNOSIS — N83202 Unspecified ovarian cyst, left side: Secondary | ICD-10-CM

## 2019-10-12 NOTE — Telephone Encounter (Signed)
Advised [name] (pt daughter) as below.

## 2019-10-12 NOTE — Telephone Encounter (Signed)
Please advise patient it is time for ultrasound to follow up on ovarian cyst. Have placed order and she should get a call from sarah in the next day or two.

## 2019-10-15 ENCOUNTER — Other Ambulatory Visit: Payer: Self-pay | Admitting: Family Medicine

## 2019-10-15 DIAGNOSIS — Z1231 Encounter for screening mammogram for malignant neoplasm of breast: Secondary | ICD-10-CM

## 2019-10-16 DIAGNOSIS — R300 Dysuria: Secondary | ICD-10-CM | POA: Diagnosis not present

## 2019-10-16 DIAGNOSIS — N390 Urinary tract infection, site not specified: Secondary | ICD-10-CM | POA: Diagnosis not present

## 2019-10-19 ENCOUNTER — Ambulatory Visit: Payer: Medicare Other

## 2019-11-19 ENCOUNTER — Ambulatory Visit
Admission: RE | Admit: 2019-11-19 | Discharge: 2019-11-19 | Disposition: A | Discharge status: Home / Self Care | Payer: Medicare Other | Source: Ambulatory Visit | Attending: Family Medicine | Admitting: Family Medicine

## 2019-11-19 ENCOUNTER — Other Ambulatory Visit: Payer: Self-pay

## 2019-11-19 DIAGNOSIS — Z1231 Encounter for screening mammogram for malignant neoplasm of breast: Secondary | ICD-10-CM | POA: Diagnosis not present

## 2019-11-24 ENCOUNTER — Ambulatory Visit: Payer: Medicare Other | Admitting: Urology

## 2019-12-03 DIAGNOSIS — Z23 Encounter for immunization: Secondary | ICD-10-CM | POA: Diagnosis not present

## 2019-12-07 DIAGNOSIS — N390 Urinary tract infection, site not specified: Secondary | ICD-10-CM | POA: Diagnosis not present

## 2019-12-10 ENCOUNTER — Other Ambulatory Visit: Payer: Self-pay | Admitting: Family Medicine

## 2019-12-10 NOTE — Telephone Encounter (Signed)
Requested medication (s) are due for refill today: yes  Requested medication (s) are on the active medication list: yes  Last refill:  09/30/2017 #4g 5 refills  Future visit scheduled: no   Notes to clinic:  expired medication. Attempted to contact patient to schedule appt no answer, left voicemail to call clinic back. Do you want to renew Rx ?     Requested Prescriptions  Pending Prescriptions Disp Refills   COMBIVENT RESPIMAT 20-100 MCG/ACT AERS respimat [Pharmacy Med Name: COMBIVENT RESPIMAT 20-100 MCG/ACT I] 4 g 5    Sig: INHALE 2 PUFFS INTO THE LUNGS 3 TIMES DAILY      Pulmonology:  Combination Products Failed - 12/10/2019 10:58 AM      Failed - Valid encounter within last 12 months    Recent Outpatient Visits           2 years ago Upper respiratory tract infection, unspecified type   [facility] Birdie Sons, MD   2 years ago Urinary tract infection with hematuria, site unspecified   [facility] Birdie Sons, MD   2 years ago [place], Dionne Bucy, MD   2 years ago Acute left-sided low back pain without sciatica   [facility] Birdie Sons, MD   2 years ago Frequency of urination   [facility] Birdie Sons, MD       Future Appointments             In 6 days Diamantina Providence, Herbert Seta, [place]

## 2019-12-15 ENCOUNTER — Encounter: Payer: Medicare Other | Admitting: Dermatology

## 2019-12-16 ENCOUNTER — Ambulatory Visit: Payer: Medicare Other | Admitting: Urology

## 2019-12-16 ENCOUNTER — Emergency Department: Payer: Medicare Other

## 2019-12-16 ENCOUNTER — Other Ambulatory Visit: Payer: Self-pay

## 2019-12-16 ENCOUNTER — Observation Stay
Admission: EM | Admit: 2019-12-16 | Discharge: 2019-12-17 | Disposition: A | Discharge status: Home / Self Care | Payer: Medicare Other | Attending: Internal Medicine | Admitting: Internal Medicine

## 2019-12-16 ENCOUNTER — Observation Stay: Payer: Medicare Other

## 2019-12-16 DIAGNOSIS — N390 Urinary tract infection, site not specified: Secondary | ICD-10-CM

## 2019-12-16 DIAGNOSIS — Z79899 Other long term (current) drug therapy: Secondary | ICD-10-CM | POA: Insufficient documentation

## 2019-12-16 DIAGNOSIS — R55 Syncope and collapse: Principal | ICD-10-CM | POA: Insufficient documentation

## 2019-12-16 DIAGNOSIS — Z96653 Presence of artificial knee joint, bilateral: Secondary | ICD-10-CM | POA: Diagnosis not present

## 2019-12-16 DIAGNOSIS — J449 Chronic obstructive pulmonary disease, unspecified: Secondary | ICD-10-CM | POA: Diagnosis not present

## 2019-12-16 DIAGNOSIS — G9341 Metabolic encephalopathy: Secondary | ICD-10-CM

## 2019-12-16 DIAGNOSIS — Z87891 Personal history of nicotine dependence: Secondary | ICD-10-CM | POA: Insufficient documentation

## 2019-12-16 DIAGNOSIS — Z20822 Contact with and (suspected) exposure to covid-19: Secondary | ICD-10-CM | POA: Insufficient documentation

## 2019-12-16 DIAGNOSIS — R410 Disorientation, unspecified: Secondary | ICD-10-CM

## 2019-12-16 DIAGNOSIS — G319 Degenerative disease of nervous system, unspecified: Secondary | ICD-10-CM | POA: Diagnosis not present

## 2019-12-16 DIAGNOSIS — I6782 Cerebral ischemia: Secondary | ICD-10-CM | POA: Diagnosis not present

## 2019-12-16 DIAGNOSIS — K449 Diaphragmatic hernia without obstruction or gangrene: Secondary | ICD-10-CM | POA: Diagnosis not present

## 2019-12-16 DIAGNOSIS — S0990XA Unspecified injury of head, initial encounter: Secondary | ICD-10-CM | POA: Diagnosis not present

## 2019-12-16 LAB — URINALYSIS, COMPLETE (UACMP) WITH MICROSCOPIC
Bacteria, UA: NONE SEEN
Bilirubin Urine: NEGATIVE
Glucose, UA: NEGATIVE mg/dL
Ketones, ur: 5 mg/dL — AB
Nitrite: NEGATIVE
Protein, ur: 30 mg/dL — AB
RBC / HPF: >50 RBC/hpf — ABNORMAL HIGH (ref 0–5)
Specific Gravity, Urine: 1.013 (ref 1.005–1.030)
pH: 6 (ref 5.0–8.0)

## 2019-12-16 LAB — BASIC METABOLIC PANEL
Anion gap: 10 (ref 5–15)
BUN: 19 mg/dL (ref 8–23)
CO2: 27 mmol/L (ref 22–32)
Calcium: 8.7 mg/dL — ABNORMAL LOW (ref 8.9–10.3)
Chloride: 100 mmol/L (ref 98–111)
Creatinine, Ser: 0.98 mg/dL (ref 0.44–1.00)
GFR, Estimated: 55 mL/min — ABNORMAL LOW (ref >60)
Glucose, Bld: 131 mg/dL — ABNORMAL HIGH (ref 70–99)
Potassium: 4 mmol/L (ref 3.5–5.1)
Sodium: 137 mmol/L (ref 135–145)

## 2019-12-16 LAB — CBC
HCT: 40.1 % (ref 36.0–46.0)
Hemoglobin: 13.3 g/dL (ref 12.0–15.0)
MCH: 31.3 pg (ref 26.0–34.0)
MCHC: 33.2 g/dL (ref 30.0–36.0)
MCV: 94.4 fL (ref 80.0–100.0)
Platelets: 348 10*3/uL (ref 150–400)
RBC: 4.25 MIL/uL (ref 3.87–5.11)
RDW: 13.4 % (ref 11.5–15.5)
WBC: 8.2 10*3/uL (ref 4.0–10.5)
nRBC: 0 % (ref 0.0–0.2)

## 2019-12-16 LAB — BRAIN NATRIURETIC PEPTIDE: B Natriuretic Peptide: 62.7 pg/mL (ref 0.0–100.0)

## 2019-12-16 LAB — RESP PANEL BY RT-PCR (FLU A&B, COVID) ARPGX2
Influenza A by PCR: NEGATIVE
Influenza B by PCR: NEGATIVE
SARS Coronavirus 2 by RT PCR: NEGATIVE

## 2019-12-16 LAB — VITAMIN B12: Vitamin B-12: 146 pg/mL — ABNORMAL LOW (ref 180–914)

## 2019-12-16 LAB — VITAMIN D 25 HYDROXY (VIT D DEFICIENCY, FRACTURES): Vit D, 25-Hydroxy: 15.57 ng/mL — ABNORMAL LOW (ref 30–100)

## 2019-12-16 LAB — CK: Total CK: 45 U/L (ref 38–234)

## 2019-12-16 LAB — TSH: TSH: 3.561 u[IU]/mL (ref 0.350–4.500)

## 2019-12-16 LAB — TROPONIN I (HIGH SENSITIVITY): Troponin I (High Sensitivity): 5 ng/L (ref <18)

## 2019-12-16 MED ORDER — SODIUM CHLORIDE 0.9 % IV BOLUS
1000.0000 mL | Freq: Once | INTRAVENOUS | Status: AC
  Administered 2019-12-16: 1000 mL via INTRAVENOUS

## 2019-12-16 MED ORDER — ENOXAPARIN SODIUM 40 MG/0.4ML ~~LOC~~ SOLN
40.0000 mg | SUBCUTANEOUS | Status: DC
  Administered 2019-12-16: 40 mg via SUBCUTANEOUS
  Filled 2019-12-16: qty 0.4

## 2019-12-16 NOTE — H&P (Signed)
History and Physical   [name] WTU:[id number] DOB: Nov 14, 1928 DOA: 12/16/2019  PCP: Birdie Sons, MD  Outpatient Specialists: [facility] Patient coming from: home  I have personally briefly reviewed patient's old medical records in [place].  Chief Concern: syncope/mental status change  HPI: Monica Singh is a 86 y.o. 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 Alexander Bergeron on 05/21/2019.  She was brought to the emergency department after her daughter came to pick her up for a doctor's appointment.  Patient reports that she was having breakfast when she felt flushed and the next thing she knew she was looking up at her breakfast table.  Daughter reports that she states she crawled her way to sit against the couch in the living room.  Which is where her daughter found patient.  Per daughter, this happened about two years ago. Daughter notes that patient was being treated for UTI at the time as well however does not recall the actual antibiotic.  Patient and daughter endorsed vomiting after she woke up continously. Patient denies presence of blood.   Review of system was negative for headache, vision changes, dysphagia, fever, chills, cough, chest pain, shortness of breath, abdominal pain, palpitations, dysuria, hematuria, diarrhea.  Patient endorsed baseline constipation. She reports she had a bm in hospital in the ED today and it was normal bm.   Social history: lives by herself, retired and formerly worked in [employer], quit smoking in [place] (0.25 ppd), drinks two glasses of wine per night, denies recreational drug use  Chart review: follows with urology and multiple recurrent UTI  ED Course: discussed with ED provider.   Review of Systems: As per HPI otherwise 10 point review of systems  negative.  Assessment/Plan  Active Problems:   Syncope   # Encephalopathy - multifactorial including metabolic, heart failure, delirium/mental disturbances with memory impairment secondary to fluoroquinolone use, abnormal cardiac rhythm -Echo ordered -Checking TSH, B12, vitamin D, -BMP tomorrow to assess electrolytes and glucose levels -EKG showed sinus rhythm with a rate of 78 no ST-T wave changes and QTC of 496, HS troponin 1 was 5-reassuring for no ACS at this time -We will continue to monitor HS troponin  -UA in the ED showed moderate leukocytes, positive for ketones and protein, and negative nitrites -Order chest x-ray  # Recurrent UTI and frequently placed on cipro - per specialist note on 05/21/19, she has been on 4 days of cipro for urine cx showing lactobacillus contaminant and mixed flora - And dysuria on 08/20/19 on empiric cipro x 7 days - H/o klebsiella pneumoniae was prescribed frequently with ciprofloxacin on 10/16/19  # Recent UTI diagnosis and at clinic and also prescribed ciprofloxacin 250 mg tablet BID for 10 days, starting on 12/07/2019 at a clinic near her second home. -She had 3 tablets remaining at time of admission -Due to problem #1 concern, I will not continue ciprofloxacin at this time -UA positive for leukocytes despite prolonged ABX therapy - query colonization  - As patient does not have symptoms of dysuria, hematuria, urgency, or frequency at this time, I will not initiate abx therapy as she has just completed 8.5 days of cipro bid.   # Left chronic UPJ obstruction status post indwelling chronic urethral stent placement-follows with [facility]  DVT prophylaxis: Enoxaparin Code Status: DNR Diet: Heart diet Family Communication: Updated daughter at bedside and over the  phone Disposition Plan: Pending clinical course Consults called: None at this time Admission status: Observation medical floor with telemetry  Past Medical History:   Diagnosis Date  . Actinic keratosis   . Adnexal mass 06/03/2014   since 2009  . Arthritis   . Complication of anesthesia   . COPD (chronic obstructive pulmonary disease) ([place])   . Diffuse large cell lymphoma in remission ([place]) 01/2007   NON-HODGKINS-stage 3, cd 20 positive; status post 6 cycles of R-CHOP  . Dyspnea    with exertion  . GERD (gastroesophageal reflux disease)   . Herpes zoster without complication 03/07/3149  . History of hiatal hernia   . History of kidney stones   . HOH (hard of hearing)    wears hearing aides  . Non Hodgkin's lymphoma ([place])   . Personal history of chemotherapy   . PONV (postoperative nausea and vomiting)    in January lasted about 4 hours  . Recurrent UTI   . Squamous cell carcinoma of skin 11/09/2008   Right post. lat. elbow. SCCis arising in [place]. Excised 01/04/2009, margins free.    Past Surgical History:  Procedure Laterality Date  . ABDOMINAL SURGERY  2009   abdominal mass+ NH lymphoma,  . BACK SURGERY  01/2017   fusion. metal plate in neck at back  . CATARACT EXTRACTION  1997   right eye and left ey  . cervical neck fusion  1995  . CYSTOSCOPY W/ RETROGRADES Left 01/30/2018   Procedure: CYSTOSCOPY WITH RETROGRADE PYELOGRAM;  Surgeon: Billey Co, MD;  Location: [facility];  Service: Urology;  Laterality: Left;  . CYSTOSCOPY W/ RETROGRADES Left 08/05/2018   Procedure: CYSTOSCOPY WITH RETROGRADE PYELOGRAM;  Surgeon: Billey Co, MD;  Location: [facility];  Service: Urology;  Laterality: Left;  . CYSTOSCOPY W/ URETERAL STENT PLACEMENT Left 08/05/2018   Procedure: CYSTOSCOPY WITH STENT Exchange;  Surgeon: Billey Co, MD;  Location: [facility];  Service: Urology;  Laterality: Left;  . CYSTOSCOPY W/ URETERAL STENT PLACEMENT Left 05/21/2019   Procedure: CYSTOSCOPY WITH STENT REPLACEMENT;  Surgeon: Billey Co, MD;  Location: [facility];  Service: Urology;  Laterality: Left;  . CYSTOSCOPY WITH BIOPSY Left 02/27/2018   Procedure: CYSTOSCOPY  WITH BIOPSY;  Surgeon: Billey Co, MD;  Location: [facility];  Service: Urology;  Laterality: Left;  . CYSTOSCOPY WITH STENT PLACEMENT Left 01/30/2018   Procedure: CYSTOSCOPY WITH STENT PLACEMENT;  Surgeon: Billey Co, MD;  Location: [facility];  Service: Urology;  Laterality: Left;  . CYSTOSCOPY WITH URETEROSCOPY AND STENT PLACEMENT Left 02/27/2018   Procedure: CYSTOSCOPY WITH URETEROSCOPY AND STENT Exchange;  Surgeon: Billey Co, MD;  Location: [facility];  Service: Urology;  Laterality: Left;  . DENTAL SURGERY     screws  . JOINT REPLACEMENT Left    knee  . KNEE ARTHROPLASTY Right 12/23/2018   Procedure: RIGHT COMPUTER ASSISTED TOTAL KNEE ARTHROPLASTY;  Surgeon: Dereck Leep, MD;  Location: [facility];  Service: Orthopedics;  Laterality: Right;  . KYPHOPLASTY N/A 02/21/2017   Procedure: [id number];  Surgeon: Hessie Knows, MD;  Location: [facility];  Service: Orthopedics;  Laterality: N/A;  . laparotomy with biopsy  03/02/2007  . PORTACATH PLACEMENT  2009  . [place]  . SQUAMOUS CELL CARCINOMA EXCISION     right arm  . TOTAL KNEE ARTHROPLASTY Left    2009  . URETEROSCOPY Left 01/30/2018   Procedure: URETEROSCOPY;  Surgeon: Billey Co, MD;  Location: [facility];  Service:  Urology;  Laterality: Left;  Marland Kitchen VAGINAL HYSTERECTOMY  1971   Social History:  reports that she quit smoking about 30 years ago. Her smoking use included cigarettes. She smoked 0.25 packs per day. She has never used smokeless tobacco. She reports current alcohol use of about 14.0 standard drinks of alcohol per week. She reports that she does not use drugs.  Allergies  Allergen Reactions  . Milk-Related Compounds Diarrhea and Nausea And Vomiting    Gas too  . Lac Bovis Diarrhea and Nausea And Vomiting    Milk allergy   . Sulfa Antibiotics Hives and Itching   Family History  Problem Relation Age of Onset  . Breast cancer Sister   . Dementia Sister   . Cataracts Sister   . Heart attack  Brother   . CAD Brother   . Heart attack Brother   . Leukemia Grandchild        granddaughter  . Kidney disease Neg Hx   . Bladder Cancer Neg Hx    Family history: Family history reviewed and not pertinent. No family history of passing out.   Prior to Admission medications   Medication Sig Start Date End Date Taking? Authorizing Provider  acetaminophen (TYLENOL) 500 MG tablet Take 1,000 mg by mouth every 6 (six) hours as needed for moderate pain.     [provider]  amoxicillin-clavulanate (AUGMENTIN) 875-125 MG tablet Take 1 tablet by mouth every 12 (twelve) hours. 09/10/19   McGowan, Larene Beach A, [name]  COMBIVENT RESPIMAT 20-100 MCG/ACT AERS respimat INHALE 2 PUFFS INTO THE LUNGS 3 TIMES DAILY 12/11/19   Birdie Sons, MD  levofloxacin (LEVAQUIN) 500 MG tablet Take 1 tablet (500 mg total) by mouth daily. 09/06/19   Debroah Loop, [name]   Physical Exam: Vitals:   12/16/19 1400 12/16/19 1430 12/16/19 1500 12/16/19 1530  BP: (!) 145/71 (!) 150/70 134/64 (!) 153/68  Pulse: 87 92 90 94  Resp: 19 18 (!) 21 18  Temp:      TempSrc:      SpO2: 100% 100% 100% 100%  Weight:      Height:       Constitutional: appears age-appropriate, NAD, calm, comfortable Eyes: PERRL, lids and conjunctivae normal ENMT: Mucous membranes are moist. Posterior pharynx clear of any exudate or lesions. Age-appropriate dentition. Hearing appropriate Neck: normal, supple, no masses, no thyromegaly Respiratory: clear to auscultation bilaterally, no wheezing, no crackles. Normal respiratory effort. No accessory muscle use.  Cardiovascular: Regular rate and rhythm, no murmurs / rubs / gallops. No extremity edema. 2+ pedal pulses. No carotid bruits.  Abdomen: no tenderness, no masses palpated, no hepatosplenomegaly. Bowel sounds positive.  Musculoskeletal: no clubbing / cyanosis. No joint deformity upper and lower extremities. Good ROM, no contractures, no atrophy. Normal muscle tone.  Skin: no rashes,  lesions, ulcers. No induration Neurologic: CN 2-12 grossly intact. Sensation intact. Strength 5/5 in all 4.  Psychiatric: Normal judgment and insight. Alert and oriented x 3. Normal mood.   EKG: Independently reviewed, showing sinus rhythm with a rate of 78, no ST changes, QTC 496  Chest x-ray on Admission: Personally reviewed and I agree with radiologist reading as below.  CT Head Wo Contrast  Result Date: 12/16/2019 CLINICAL DATA:  Head trauma, minor. Additional provided: Syncope this morning, patient found down. EXAM: CT HEAD WITHOUT CONTRAST TECHNIQUE: Contiguous axial images were obtained from the base of the skull through the vertex without intravenous contrast. COMPARISON:  Head CT 01/09/2013. FINDINGS: Brain: Mild generalized cerebral atrophy.  Advanced ill-defined hypoattenuation within the cerebral white matter is nonspecific, but compatible with chronic small vessel ischemic disease. There is no acute intracranial hemorrhage. No demarcated cortical infarct. No extra-axial fluid collection. No evidence of intracranial mass. No midline shift. Vascular: No hyperdense vessel.  Atherosclerotic calcifications. Skull: No calvarial fracture. Nonspecific thinning of the right parietal calvarium. Sinuses/Orbits: Visualized orbits show no acute finding. No significant paranasal sinus disease at the imaged levels. IMPRESSION: No evidence of acute intracranial abnormality. Mild cerebral atrophy with advanced chronic small vessel ischemic disease, progressed as compared to the head CT of 01/09/2013. Nonspecific thinning of the right parietal calvarium. Electronically Signed   By: Kellie Simmering DO   On: 12/16/2019 12:59   Labs on Admission: I have personally reviewed following labs  CBC: Recent Labs  Lab 12/16/19 1139  WBC 8.2  HGB 13.3  HCT 40.1  MCV 94.4  PLT 638   Basic Metabolic Panel: Recent Labs  Lab 12/16/19 1139  NA 137  K 4.0  CL 100  CO2 27  GLUCOSE 131*  BUN 19  CREATININE  0.98  CALCIUM 8.7*   Cardiac Enzymes: Recent Labs  Lab 12/16/19 1139  CKTOTAL 45   Urine analysis:    Component Value Date/Time   COLORURINE YELLOW (A) 12/16/2019 1448   APPEARANCEUR HAZY (A) 12/16/2019 1448   APPEARANCEUR Cloudy (A) 09/06/2019 1545   LABSPEC 1.013 12/16/2019 1448   LABSPEC 1.010 01/09/2013 0647   PHURINE 6.0 12/16/2019 1448   GLUCOSEU NEGATIVE 12/16/2019 1448   GLUCOSEU Negative 01/09/2013 0647   HGBUR LARGE (A) 12/16/2019 1448   BILIRUBINUR NEGATIVE 12/16/2019 1448   BILIRUBINUR Negative 09/06/2019 1545   BILIRUBINUR Negative 01/09/2013 0647   KETONESUR 5 (A) 12/16/2019 1448   PROTEINUR 30 (A) 12/16/2019 1448   UROBILINOGEN 0.2 09/02/2017 1042   NITRITE NEGATIVE 12/16/2019 1448   LEUKOCYTESUR MODERATE (A) 12/16/2019 1448   LEUKOCYTESUR 2+ 01/09/2013 0647   Monica Singh N Seniah Lawrence D.O. [facility]  If 12AM-7AM, please contact overnight-coverage provider If 7AM-7PM, please contact day coverage provider [email or web address]  12/16/2019, 4:18 PM

## 2019-12-16 NOTE — ED Notes (Signed)
Patient aware of need for urine specimen, patient had bowel movement but did not obtain urine sample at this time, provided with water and will try again later.

## 2019-12-16 NOTE — ED Notes (Signed)
ED Provider at bedside. 

## 2019-12-16 NOTE — ED Notes (Signed)
Pt attempted to provide urine sample, unable

## 2019-12-16 NOTE — ED Provider Notes (Signed)
[facility] Emergency Department Provider Note   ____________________________________________   First MD Initiated Contact with Patient 12/16/19 1119     (approximate)  I have reviewed the triage vital signs and the nursing notes.   HISTORY  Chief Complaint Loss of Consciousness    HPI Monica Singh is a 86 y.o. female with a stated past medical history of GERD, chronic UTIs, COPD, and hypertension who presents for syncope.  Patient states that she was in her normal state of health when she was sitting at her breakfast table this morning and woke up underneath it.  Patient denies any preceding symptoms.  Patient states that after she woke up she was unable to get up from the floor and called her daughter to come and help her.  When her daughter arrived she stated that she needed to have a bowel movement and when she sat on the toilet she began to have multiple episodes of nonbloody emesis.  Patient denies any current nausea or any other pain.  Patient does not know whether she hit her head when she fell.  Patient denies any blood thinner use.         Past Medical History:  Diagnosis Date  . Actinic keratosis   . Adnexal mass 06/03/2014   since 2009  . Arthritis   . Complication of anesthesia   . COPD (chronic obstructive pulmonary disease) ([place])   . Diffuse large cell lymphoma in remission ([place]) 01/2007   NON-HODGKINS-stage 3, cd 20 positive; status post 6 cycles of R-CHOP  . Dyspnea    with exertion  . GERD (gastroesophageal reflux disease)   . Herpes zoster without complication 05/31/6642  . History of hiatal hernia   . History of kidney stones   . HOH (hard of hearing)    wears hearing aides  . Non Hodgkin's lymphoma ([place])   . Personal history of chemotherapy   . PONV (postoperative nausea and vomiting)    in January lasted about 4 hours  . Recurrent UTI   . Squamous cell carcinoma of skin 11/09/2008   Right post. lat. elbow. SCCis  arising in [place]. Excised 01/04/2009, margins free.     Patient Active Problem List   Diagnosis Date Noted  . Total knee replacement status 12/23/2018  . Obstruction of left ureteropelvic junction (UPJ) 02/26/2018  . Sepsis ([place]) 02/10/2018  . Closed burst fracture of lumbar vertebra with routine healing 04/01/2017  . Chronic bronchitis ([place]) 02/18/2017  . Osteoporosis 11/20/2015  . COPD (chronic obstructive pulmonary disease) ([place]) 11/20/2015  . Cyst of ovary 07/26/2015  . Status post total left knee replacement 04/03/2015  . Abnormal chest sounds 08/25/2014  . Allergic rhinitis 08/25/2014  . Colon polyp 08/25/2014  . DDD (degenerative disc disease), cervical 08/25/2014  . DDD (degenerative disc disease), lumbar 08/25/2014  . H/O non-Hodgkin's lymphoma 08/25/2014  . Below normal amount of sodium in the blood 08/25/2014  . Primary osteoarthritis of right knee 08/25/2014  . Subclinical hypothyroidism 08/25/2014  . Adnexal mass 06/03/2014  . Osteoarthritis 03/19/2011  . 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 History of smoking 03/11/2008    Past Surgical History:  Procedure Laterality Date  . ABDOMINAL SURGERY  2009   abdominal mass+ NH lymphoma,  . BACK SURGERY  01/2017   fusion. metal plate in neck at back  . CATARACT EXTRACTION  1997   right eye and left ey  . cervical neck fusion  1995  . CYSTOSCOPY W/ RETROGRADES  Left 01/30/2018   Procedure: CYSTOSCOPY WITH RETROGRADE PYELOGRAM;  Surgeon: Billey Co, MD;  Location: [facility];  Service: Urology;  Laterality: Left;  . CYSTOSCOPY W/ RETROGRADES Left 08/05/2018   Procedure: CYSTOSCOPY WITH RETROGRADE PYELOGRAM;  Surgeon: Billey Co, MD;  Location: [facility];  Service: Urology;  Laterality: Left;  . CYSTOSCOPY W/ URETERAL STENT PLACEMENT Left 08/05/2018   Procedure: CYSTOSCOPY WITH STENT Exchange;  Surgeon: Billey Co, MD;  Location: [facility];  Service: Urology;  Laterality: Left;  . CYSTOSCOPY W/ URETERAL STENT  PLACEMENT Left 05/21/2019   Procedure: CYSTOSCOPY WITH STENT REPLACEMENT;  Surgeon: Billey Co, MD;  Location: [facility];  Service: Urology;  Laterality: Left;  . CYSTOSCOPY WITH BIOPSY Left 02/27/2018   Procedure: CYSTOSCOPY WITH BIOPSY;  Surgeon: Billey Co, MD;  Location: [facility];  Service: Urology;  Laterality: Left;  . CYSTOSCOPY WITH STENT PLACEMENT Left 01/30/2018   Procedure: CYSTOSCOPY WITH STENT PLACEMENT;  Surgeon: Billey Co, MD;  Location: [facility];  Service: Urology;  Laterality: Left;  . CYSTOSCOPY WITH URETEROSCOPY AND STENT PLACEMENT Left 02/27/2018   Procedure: CYSTOSCOPY WITH URETEROSCOPY AND STENT Exchange;  Surgeon: Billey Co, MD;  Location: [facility];  Service: Urology;  Laterality: Left;  . DENTAL SURGERY     screws  . JOINT REPLACEMENT Left    knee  . KNEE ARTHROPLASTY Right 12/23/2018   Procedure: RIGHT COMPUTER ASSISTED TOTAL KNEE ARTHROPLASTY;  Surgeon: Dereck Leep, MD;  Location: [facility];  Service: Orthopedics;  Laterality: Right;  . KYPHOPLASTY N/A 02/21/2017   Procedure: [id number]-M0;  Surgeon: Hessie Knows, MD;  Location: [facility];  Service: Orthopedics;  Laterality: N/A;  . laparotomy with biopsy  03/02/2007  . PORTACATH PLACEMENT  2009  . [place]  . SQUAMOUS CELL CARCINOMA EXCISION     right arm  . TOTAL KNEE ARTHROPLASTY Left    2009  . URETEROSCOPY Left 01/30/2018   Procedure: URETEROSCOPY;  Surgeon: Billey Co, MD;  Location: [facility];  Service: Urology;  Laterality: Left;  Marland Kitchen VAGINAL HYSTERECTOMY  1971    Prior to Admission medications   Medication Sig Start Date End Date Taking? Authorizing Provider  acetaminophen (TYLENOL) 500 MG tablet Take 1,000 mg by mouth every 6 (six) hours as needed for moderate pain.     [provider]  amoxicillin-clavulanate (AUGMENTIN) 875-125 MG tablet Take 1 tablet by mouth every 12 (twelve) hours. 09/10/19   McGowan, Larene Beach A, [name]  COMBIVENT RESPIMAT 20-100 MCG/ACT  AERS respimat INHALE 2 PUFFS INTO THE LUNGS 3 TIMES DAILY 12/11/19   Birdie Sons, MD  levofloxacin (LEVAQUIN) 500 MG tablet Take 1 tablet (500 mg total) by mouth daily. 09/06/19   Debroah Loop, [name]    Allergies Milk-related compounds, Lac bovis, and Sulfa antibiotics  Family History  Problem Relation Age of Onset  . Breast cancer Sister   . Dementia Sister   . Cataracts Sister   . Heart attack Brother   . CAD Brother   . Heart attack Brother   . Leukemia Grandchild        granddaughter  . Kidney disease Neg Hx   . Bladder Cancer Neg Hx     Social History Social History   Tobacco Use  . Smoking status: Former Smoker    Packs/day: 0.25    Types: Cigarettes    Quit date: 06/21/1989    Years since quitting: 30.5  . Smokeless tobacco: Never Used  Vaping Use  .  Vaping Use: Never used  Substance Use Topics  . Alcohol use: Yes    Alcohol/week: 14.0 standard drinks    Types: 14 Glasses of wine per week    Comment: 1 glass a night  . Drug use: No    Review of Systems Constitutional: No fever/chills Eyes: No visual changes. ENT: No sore throat. Cardiovascular: Denies chest pain. Respiratory: Denies shortness of breath. Gastrointestinal: No abdominal pain.  No nausea, no vomiting.  No diarrhea. Genitourinary: Negative for dysuria. Musculoskeletal: Negative for acute arthralgias Skin: Negative for rash. Neurological: Negative for headaches, weakness/numbness/paresthesias in any extremity Psychiatric: Negative for suicidal ideation/homicidal ideation   ____________________________________________   PHYSICAL EXAM:  VITAL SIGNS: ED Triage Vitals  Enc Vitals Group     BP 12/16/19 1124 127/65     Pulse Rate 12/16/19 1120 84     Resp 12/16/19 1120 18     Temp 12/16/19 1120 97.9 F (36.6 C)     Temp Source 12/16/19 1120 Oral     SpO2 12/16/19 1120 97 %     Weight 12/16/19 1121 141 lb 1.5 oz (64 kg)     Height 12/16/19 1121 5\' 2"  (1.575 m)     Head  Circumference --      Peak Flow --      Pain Score 12/16/19 1121 0     Pain Loc --      Pain Edu? --      Excl. in [place]? --    Constitutional: Alert and oriented. Well appearing and in no acute distress. Eyes: Conjunctivae are normal. PERRL. Head: Atraumatic. Nose: No congestion/rhinnorhea. Mouth/Throat: Mucous membranes are moist. Neck: No stridor Cardiovascular: Grossly normal heart sounds.  Good peripheral circulation. Respiratory: Normal respiratory effort.  No retractions. Gastrointestinal: Soft and nontender. No distention. Musculoskeletal: No obvious deformities Neurologic:  Normal speech and language. No gross focal neurologic deficits are appreciated. Skin:  Skin is warm and dry. No rash noted. Psychiatric: Mood and affect are normal. Speech and behavior are normal.  ____________________________________________   LABS (all labs ordered are listed, but only abnormal results are displayed)  Labs Reviewed  BASIC METABOLIC PANEL - Abnormal; Notable for the following components:      Result Value   Glucose, Bld 131 (*)    Calcium 8.7 (*)    GFR, Estimated 55 (*)    All other components within normal limits  URINALYSIS, COMPLETE (UACMP) WITH MICROSCOPIC - Abnormal; Notable for the following components:   Color, Urine YELLOW (*)    APPearance HAZY (*)    Hgb urine dipstick LARGE (*)    Ketones, ur 5 (*)    Protein, ur 30 (*)    Leukocytes,Ua MODERATE (*)    RBC / HPF >50 (*)    All other components within normal limits  RESP PANEL BY RT-PCR (FLU A&B, COVID) ARPGX2  CBC  CK  TROPONIN I (HIGH SENSITIVITY)   ____________________________________________  EKG  ED ECG REPORT I, Naaman Plummer, the attending physician, personally viewed and interpreted this ECG.  Date: 12/16/2019 EKG Time: 1126 Rate: 78 Rhythm: normal sinus rhythm QRS Axis: normal Intervals: normal ST/T Wave abnormalities: normal Narrative Interpretation: no evidence of acute  ischemia  ____________________________________________  RADIOLOGY  ED MD interpretation: CT of the head without contrast shows no evidence of acute abnormalities including no edema, hemorrhage, or fractures  Official radiology report(s): CT Head Wo Contrast  Result Date: 12/16/2019 CLINICAL DATA:  Head trauma, minor. Additional provided: Syncope this morning, patient found  down. EXAM: CT HEAD WITHOUT CONTRAST TECHNIQUE: Contiguous axial images were obtained from the base of the skull through the vertex without intravenous contrast. COMPARISON:  Head CT 01/09/2013. FINDINGS: Brain: Mild generalized cerebral atrophy. Advanced ill-defined hypoattenuation within the cerebral white matter is nonspecific, but compatible with chronic small vessel ischemic disease. There is no acute intracranial hemorrhage. No demarcated cortical infarct. No extra-axial fluid collection. No evidence of intracranial mass. No midline shift. Vascular: No hyperdense vessel.  Atherosclerotic calcifications. Skull: No calvarial fracture. Nonspecific thinning of the right parietal calvarium. Sinuses/Orbits: Visualized orbits show no acute finding. No significant paranasal sinus disease at the imaged levels. IMPRESSION: No evidence of acute intracranial abnormality. Mild cerebral atrophy with advanced chronic small vessel ischemic disease, progressed as compared to the head CT of 01/09/2013. Nonspecific thinning of the right parietal calvarium. Electronically Signed   By: Kellie Simmering DO   On: 12/16/2019 12:59    ____________________________________________   PROCEDURES  Procedure(s) performed (including Critical Care):  .1-3 Lead EKG Interpretation Performed by: Naaman Plummer, MD Authorized by: Naaman Plummer, MD     Interpretation: normal     ECG rate:  98   ECG rate assessment: normal     Rhythm: sinus rhythm     Ectopy: none     Conduction: normal        ____________________________________________   INITIAL IMPRESSION / ASSESSMENT AND PLAN / ED COURSE  As part of my medical decision making, I reviewed the following data within the [facility] notes reviewed and incorporated, Labs reviewed, EKG interpreted, Old chart reviewed, Radiograph reviewed and Notes from prior ED visits reviewed and incorporated        Patient presents with complaints of syncope/presyncope ED Workup:  CBC, BMP, Troponin, BNP, ECG, CXR Differential diagnosis includes HF, ICH, seizure, stroke, HOCM, ACS, aortic dissection, malignant arrhythmia, or GI bleed. Findings: No evidence of acute laboratory abnormalities.  Troponin negative x1 EKG: No e/o STEMI. No evidence of Brugadas sign, delta wave, epsilon wave, significantly prolonged QTc, or malignant arrhythmia.  Disposition: Admit to medicine, telemetry bed for cardiac monitoring and cardiology review.      ____________________________________________   FINAL CLINICAL IMPRESSION(S) / ED DIAGNOSES  Final diagnoses:  Syncope and collapse     ED Discharge Orders    None       Note:  This document was prepared using Dragon voice recognition software and may include unintentional dictation errors.   Naaman Plummer, MD 12/16/19 [phone]

## 2019-12-16 NOTE — ED Triage Notes (Signed)
PT to ED via POV with her daughter with c/o syncope this morning. Daughter went to pick her up for a neuro appt and pt was on the floor under the breakfast table. PT has been being tx for UTI with cipro since 11/9. Has been taking abx. When getting pt up, pt felt very nauseated. PT daughter also reports pt has been cursing lately which is very much not like her.

## 2019-12-16 NOTE — ED Notes (Signed)
ED Provider at bedside to update on plan of care for admission to hospital

## 2019-12-17 ENCOUNTER — Observation Stay
Admit: 2019-12-17 | Discharge: 2019-12-17 | Disposition: A | Discharge status: Home / Self Care | Payer: Medicare Other | Attending: Internal Medicine | Admitting: Internal Medicine

## 2019-12-17 DIAGNOSIS — R55 Syncope and collapse: Secondary | ICD-10-CM | POA: Diagnosis not present

## 2019-12-17 LAB — BASIC METABOLIC PANEL
Anion gap: 9 (ref 5–15)
BUN: 15 mg/dL (ref 8–23)
CO2: 24 mmol/L (ref 22–32)
Calcium: 8.5 mg/dL — ABNORMAL LOW (ref 8.9–10.3)
Chloride: 102 mmol/L (ref 98–111)
Creatinine, Ser: 0.77 mg/dL (ref 0.44–1.00)
GFR, Estimated: >60 mL/min (ref >60)
Glucose, Bld: 99 mg/dL (ref 70–99)
Potassium: 3.4 mmol/L — ABNORMAL LOW (ref 3.5–5.1)
Sodium: 135 mmol/L (ref 135–145)

## 2019-12-17 LAB — ECHOCARDIOGRAM COMPLETE
AR max vel: 2.44 cm2
AV Area VTI: 2.22 cm2
AV Area mean vel: 2.29 cm2
AV Mean grad: 2 mmHg
AV Peak grad: 3.8 mmHg
Ao pk vel: 0.97 m/s
Area-P 1/2: 3.89 cm2
Height: 62 in
S' Lateral: 2.48 cm
Weight: 2257.51 oz

## 2019-12-17 LAB — FOLATE: Folate: 12.9 ng/mL (ref >5.9)

## 2019-12-17 LAB — CBC
HCT: 33.9 % — ABNORMAL LOW (ref 36.0–46.0)
Hemoglobin: 11.2 g/dL — ABNORMAL LOW (ref 12.0–15.0)
MCH: 30.9 pg (ref 26.0–34.0)
MCHC: 33 g/dL (ref 30.0–36.0)
MCV: 93.6 fL (ref 80.0–100.0)
Platelets: 294 10*3/uL (ref 150–400)
RBC: 3.62 MIL/uL — ABNORMAL LOW (ref 3.87–5.11)
RDW: 13.2 % (ref 11.5–15.5)
WBC: 7 10*3/uL (ref 4.0–10.5)
nRBC: 0 % (ref 0.0–0.2)

## 2019-12-17 MED ORDER — POTASSIUM CHLORIDE CRYS ER 20 MEQ PO TBCR
40.0000 meq | EXTENDED_RELEASE_TABLET | Freq: Once | ORAL | Status: AC
  Administered 2019-12-17: 10:00:00 40 meq via ORAL
  Filled 2019-12-17: qty 2

## 2019-12-17 MED ORDER — FOLIC ACID 1 MG PO TABS
1.0000 mg | ORAL_TABLET | Freq: Every day | ORAL | 2 refills | Status: DC
Start: 2019-12-18 — End: 2021-09-16

## 2019-12-17 MED ORDER — VITAMIN D (ERGOCALCIFEROL) 1.25 MG (50000 UNIT) PO CAPS
50000.0000 [IU] | ORAL_CAPSULE | ORAL | Status: DC
  Administered 2019-12-17: 10:00:00 50000 [IU] via ORAL
  Filled 2019-12-17: qty 1

## 2019-12-17 MED ORDER — FOLIC ACID 1 MG PO TABS
1.0000 mg | ORAL_TABLET | Freq: Every day | ORAL | Status: DC
  Administered 2019-12-17: 10:00:00 1 mg via ORAL
  Filled 2019-12-17: qty 1

## 2019-12-17 MED ORDER — VITAMIN D (ERGOCALCIFEROL) 1.25 MG (50000 UNIT) PO CAPS: 50000.0000 [IU] | ORAL_CAPSULE | ORAL | 0 refills | Status: DC

## 2019-12-17 MED ORDER — VITAMIN B-12 1000 MCG PO TABS
1000.0000 ug | ORAL_TABLET | Freq: Every day | ORAL | Status: DC
  Administered 2019-12-17: 10:00:00 1000 ug via ORAL
  Filled 2019-12-17: qty 1

## 2019-12-17 MED ORDER — CYANOCOBALAMIN 1000 MCG PO TABS: 1000.0000 ug | ORAL_TABLET | Freq: Every day | ORAL | 2 refills | Status: DC

## 2019-12-17 NOTE — Discharge Summary (Signed)
Physician Discharge Summary  [place] YQM:[id number] DOB: 03/16/1928 DOA: 12/16/2019  PCP: Birdie Sons, MD  Admit date: 12/16/2019 Discharge date: 12/17/2019  Admitted From: home Disposition:  home  Recommendations for Outpatient Follow-up:  1. Follow up with PCP in 1-2 weeks 2. Please obtain BMP/CBC in one week 3. Please follow up with cardiology for ambulatory heart monitor placement to rule out cardiac arrhythmia as cause of syncopal episode 4. Follow up on vitamin D and B12 levels.  Patient deficient in both and was started on replacement.    Home Health: No  Equipment/Devices: None   Discharge Condition: Stable  CODE STATUS: DNR  Diet recommendation: Regular   Discharge Diagnoses: Active Problems:   Syncope    Summary of HPI and Hospital Course:   HPI on admission, per Dr. Tobie Poet: "Monica Singh is a 86 y.o. 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 Alexander Bergeron on 05/21/2019.  She was brought to the emergency department after her daughter came to pick her up for a doctor's appointment.  Patient reports that she was having breakfast when she felt flushed and the next thing she knew she was looking up at her breakfast table.  Daughter reports that she states she crawled her way to sit against the couch in the living room.  Which is where her daughter found patient.  Per daughter, this happened about two years ago. Daughter notes that patient was being treated for UTI at the time as well however does not recall the actual antibiotic..."   Patient was admitted for observation.  Monitored on telemetry with no notable events.  Orthostatic vitals were normal.  Troponin x 2 normal and ECG without acute ischemic changes.  QTc prolonged at 496 (had been on Cipro for UTI).  Pt was no longer symptomatic from UTI so further antibiotics were not  given.  Echo showed ED 52-84%, normal diastolic parameters, mild to moderate MR and TR.     She was found to be deficient in vitamins B12 and D and started on supplements for both.   Patient remained asymptomatic without pre-syncopal symptoms, and ambulating safely, at her baseline.    Patient is clinically improved and stable for discharge.  She will be set up with close cardiology follow up for Zio patch ambulatory heart monitor.       Discharge Instructions   Discharge Instructions    Call MD for:   Complete by: As directed    Any more fainting spells   Call MD for:  extreme fatigue   Complete by: As directed    Call MD for:  persistant dizziness or light-headedness   Complete by: As directed    Call MD for:  persistant nausea and vomiting   Complete by: As directed    Call MD for:  severe uncontrolled pain   Complete by: As directed    Call MD for:  temperature >100.4   Complete by: As directed    Diet - low sodium heart healthy   Complete by: As directed    Discharge instructions   Complete by: As directed    Your echocardiogram showed your heart muscle is good and strong.  One of the heart valves is a bit leaky, but probably has been that way for awhile and not causing problems.  Sometimes leaky valves can cause fluid / swelling in your legs or shortness of breath, so if  you notice those things in the future, call your doctor to let them know.  I want cardiology to see you for a 2 week heart monitor.  We have not seen any abnormal heart rhythms while monitoring you in the hospital, but to be thorough, it would be best to have longer monitoring done as outpatient.  I've included contact information for Dr. Donivan Scull practice with [facility].  He can also discuss your echocardiogram in more detail with you.  Your orthostatic vital signs were normal - your blood pressure did not drop with sitting or standing up, so it's unlikely that a drop in BP caused you to  faint.  I have not found a reason for you to have passed out, but is was most likely a "vaso-vagal" episode.  This is a benign cause of fainting, and because you felt hot / flushed before it happened, this seems the most likely explanation.    I've started you on some vitamin supplements.  Your levels of vitamin D and B12 are quite low.   Vitamin D replacement, when we start out, is once weekly - 50,000 units.  Once you run out of those, have your primary care doctor recheck a vitamin D level, and they will probably continue you on 1000-2000 units daily.   Vitamin F64 and folic acid (another B vitamin) are once daily pills.  You should notice improved energy with taking these to correct deficiencies.   Increase activity slowly   Complete by: As directed      Allergies as of 12/17/2019      Reactions   Milk-related Compounds Diarrhea, Nausea And Vomiting   Gas too   Lac Bovis Diarrhea, Nausea And Vomiting   Milk allergy   Sulfa Antibiotics Hives, Itching      Medication List    STOP taking these medications   ciprofloxacin 500 MG tablet Commonly known as: CIPRO     TAKE these medications   acetaminophen 500 MG tablet Commonly known as: TYLENOL Take 1,000 mg by mouth every 6 (six) hours as needed for moderate pain.   Combivent Respimat 20-100 MCG/ACT Aers respimat Generic drug: Ipratropium-Albuterol INHALE 2 PUFFS INTO THE LUNGS 3 TIMES DAILY What changed: See the new instructions.   cyanocobalamin 1000 MCG tablet Take 1 tablet (1,000 mcg total) by mouth daily. Start taking on: December 18, 3327   folic acid 1 MG tablet Commonly known as: FOLVITE Take 1 tablet (1 mg total) by mouth daily. Start taking on: December 18, 2019   Vitamin D (Ergocalciferol) 1.25 MG (50000 UNIT) Caps capsule Commonly known as: DRISDOL Take 1 capsule (50,000 Units total) by mouth every 7 (seven) days. Start taking on: December 24, 2019       Follow-up Information    Gollan, Kathlene November, MD.  Schedule an appointment as soon as possible for a visit in 1 week(s).   Specialty: Cardiology Why: For Ziopatch ambulatory heart monitor after observation admission for syncope.   Also review echo results in detail.   Contact information: [place] [phone]        Birdie Sons, MD. Schedule an appointment as soon as possible for a visit in 1 week(s).   Specialty: Family Medicine Why: hospital follow up for syncope.   Contact information: [address] [place] [address] [phone]              Allergies  Allergen Reactions  . Milk-Related  Compounds Diarrhea and Nausea And Vomiting    Gas too  . Lac Bovis Diarrhea and Nausea And Vomiting    Milk allergy   . Sulfa Antibiotics Hives and Itching    Consultations:  None   Procedures/Studies: CT Head Wo Contrast  Result Date: 12/16/2019 CLINICAL DATA:  Head trauma, minor. Additional provided: Syncope this morning, patient found down. EXAM: CT HEAD WITHOUT CONTRAST TECHNIQUE: Contiguous axial images were obtained from the base of the skull through the vertex without intravenous contrast. COMPARISON:  Head CT 01/09/2013. FINDINGS: Brain: Mild generalized cerebral atrophy. Advanced ill-defined hypoattenuation within the cerebral white matter is nonspecific, but compatible with chronic small vessel ischemic disease. There is no acute intracranial hemorrhage. No demarcated cortical infarct. No extra-axial fluid collection. No evidence of intracranial mass. No midline shift. Vascular: No hyperdense vessel.  Atherosclerotic calcifications. Skull: No calvarial fracture. Nonspecific thinning of the right parietal calvarium. Sinuses/Orbits: Visualized orbits show no acute finding. No significant paranasal sinus disease at the imaged levels. IMPRESSION: No evidence of acute intracranial abnormality. Mild cerebral atrophy with advanced chronic small vessel ischemic disease,  progressed as compared to the head CT of 01/09/2013. Nonspecific thinning of the right parietal calvarium. Electronically Signed   By: Kellie Simmering DO   On: 12/16/2019 12:59   Portable chest 1 View  Result Date: 12/16/2019 CLINICAL DATA:  Syncope EXAM: PORTABLE CHEST 1 VIEW COMPARISON:  02/10/2018 FINDINGS: There is hyperinflation of the lungs compatible with COPD. Heart is borderline in size. Moderate-sized hiatal hernia. No confluent airspace opacities, effusions or edema. No acute bony abnormality. IMPRESSION: COPD.  Moderate-sized hiatal hernia.  No active disease. Electronically Signed   By: Rolm Baptise M.D.   On: 12/16/2019 17:16   MM 3D SCREEN BREAST BILATERAL  Result Date: 11/22/2019 CLINICAL DATA:  Screening. EXAM: DIGITAL SCREENING BILATERAL MAMMOGRAM WITH TOMO AND CAD COMPARISON:  Previous exam(s). ACR Breast Density Category b: There are scattered areas of fibroglandular density. FINDINGS: There are no findings suspicious for malignancy. Images were processed with CAD. IMPRESSION: No mammographic evidence of malignancy. A result letter of this screening mammogram will be mailed directly to the patient. RECOMMENDATION: Screening mammogram in one year. (Code:SM-B-01Y) BI-RADS CATEGORY  1: Negative. Electronically Signed   By: Lajean Manes M.D.   On: 11/22/2019 10:14   ECHOCARDIOGRAM COMPLETE  Result Date: 12/17/2019    ECHOCARDIOGRAM REPORT   Patient Name:   JASIAH BUNTIN [facility] Date of Exam: 12/17/2019 Medical Rec #:  [id number]           Height:       62.0 in Accession #:    [id number]          Weight:       141.1 lb Date of Birth:  06-13-1928           BSA:          1.648 m Patient Age:    86 years            BP:           142/70 mmHg Patient Gender: F                   HR:           66 bpm. Exam Location:  [facility] Procedure: 2D Echo, Color Doppler and Cardiac Doppler Indications:     R55 Syncope  History:         Patient has no prior history of Echocardiogram examinations.  COPD. Hx of chemotherapy.  Sonographer:     Charmayne Sheer RDCS (AE) Referring Phys:  [id number] Floyce Stakes Santo Zahradnik Diagnosing Phys: Serafina Royals MD  Sonographer Comments: Suboptimal apical window and suboptimal subcostal window. IMPRESSIONS  1. Left ventricular ejection fraction, by estimation, is 55 to 60%. The left ventricle has normal function. The left ventricle has no regional wall motion abnormalities. Left ventricular diastolic parameters were normal.  2. Right ventricular systolic function is normal. The right ventricular size is normal.  3. Left atrial size was mildly dilated.  4. The mitral valve is degenerative. Mild to moderate mitral valve regurgitation.  5. Tricuspid valve regurgitation is mild to moderate.  6. The aortic valve is normal in structure. Aortic valve regurgitation is trivial. FINDINGS  Left Ventricle: Left ventricular ejection fraction, by estimation, is 55 to 60%. The left ventricle has normal function. The left ventricle has no regional wall motion abnormalities. The left ventricular internal cavity size was normal in size. There is  no left ventricular hypertrophy. Left ventricular diastolic parameters were normal. Right Ventricle: The right ventricular size is normal. No increase in right ventricular wall thickness. Right ventricular systolic function is normal. Left Atrium: Left atrial size was mildly dilated. Right Atrium: Right atrial size was normal in size. Pericardium: There is no evidence of pericardial effusion. Mitral Valve: The mitral valve is degenerative in appearance. There is moderate thickening of the posterior mitral valve leaflet(s). There is moderate calcification of the posterior mitral valve leaflet(s). Mildly decreased mobility of the mitral valve leaflets. Mild to moderate mitral annular calcification. Mild to moderate mitral valve regurgitation. MV peak gradient, 11.4 mmHg. The mean mitral valve gradient is 4.0 mmHg. Tricuspid Valve: The tricuspid valve is normal in  structure. Tricuspid valve regurgitation is mild to moderate. Aortic Valve: The aortic valve is normal in structure. Aortic valve regurgitation is trivial. Aortic valve mean gradient measures 2.0 mmHg. Aortic valve peak gradient measures 3.8 mmHg. Aortic valve area, by VTI measures 2.22 cm. Pulmonic Valve: The pulmonic valve was normal in structure. Pulmonic valve regurgitation is not visualized. Aorta: The aortic root and ascending aorta are structurally normal, with no evidence of dilitation. IAS/Shunts: No atrial level shunt detected by color flow Doppler.  LEFT VENTRICLE PLAX 2D LVIDd:         4.06 cm  Diastology LVIDs:         2.48 cm  LV e' medial:    5.11 cm/s LV PW:         0.94 cm  LV E/e' medial:  24.3 LV IVS:        0.70 cm  LV e' lateral:   6.96 cm/s LVOT diam:     2.10 cm  LV E/e' lateral: 17.8 LV SV:         45 LV SV Index:   27 LVOT Area:     3.46 cm  LEFT ATRIUM           Index LA diam:      3.90 cm 2.37 cm/m LA Vol (A2C): 23.1 ml 14.01 ml/m LA Vol (A4C): 43.8 ml 26.57 ml/m  AORTIC VALVE                   PULMONIC VALVE AV Area (Vmax):    2.44 cm    PV Vmax:       0.83 m/s AV Area (Vmean):   2.29 cm    PV Vmean:      57.000 cm/s AV Area (VTI):  2.22 cm    PV VTI:        0.125 m AV Vmax:           97.40 cm/s  PV Peak grad:  2.8 mmHg AV Vmean:          72.000 cm/s PV Mean grad:  2.0 mmHg AV VTI:            0.203 m AV Peak Grad:      3.8 mmHg AV Mean Grad:      2.0 mmHg LVOT Vmax:         68.70 cm/s LVOT Vmean:        47.600 cm/s LVOT VTI:          0.130 m LVOT/AV VTI ratio: 0.64  AORTA Ao Root diam: 3.40 cm MITRAL VALVE                TRICUSPID VALVE MV Area (PHT): 3.89 cm     TR Peak grad:   25.6 mmHg MV Peak grad:  11.4 mmHg    TR Vmax:        253.00 cm/s MV Mean grad:  4.0 mmHg MV Vmax:       1.69 m/s     SHUNTS MV Vmean:      97.7 cm/s    Systemic VTI:  0.13 m MV Decel Time: 195 msec     Systemic Diam: 2.10 cm MV E velocity: 124.00 cm/s MV A velocity: 153.00 cm/s MV E/A ratio:  0.81  Serafina Royals MD Electronically signed by Serafina Royals MD Signature Date/Time: 12/17/2019/4:11:10 PM    Final      Subjective: Pt feeling well this AM.  Seen with daughter at bedside.  Denies chest pain, palpitations, SOB, dizziness or lightheadedness.  Hopes to go home today.  Agreeable with plan for outpatient heart monitor for further evaluation.   Discharge Exam: Vitals:   12/17/19 1128 12/17/19 1602  BP: (!) 142/90 (!) 153/78  Pulse: 77 79  Resp: 18 20  Temp: 98 F (36.7 C) 98.3 F (36.8 C)  SpO2: 96% 99%   Vitals:   12/17/19 0504 12/17/19 0733 12/17/19 1128 12/17/19 1602  BP: 133/72 (!) 142/70 (!) 142/90 (!) 153/78  Pulse: 86 80 77 79  Resp: 17 18 18 20   Temp: 97.8 F (36.6 C) 98.2 F (36.8 C) 98 F (36.7 C) 98.3 F (36.8 C)  TempSrc: Oral Oral Oral Oral  SpO2: 95% 97% 96% 99%  Weight:      Height:        General: Pt is alert, awake, not in acute distress Cardiovascular: RRR, S1/S2 +, no rubs, no gallops Respiratory: CTA bilaterally, no wheezing, no rhonchi Abdominal: Soft, NT, ND, bowel sounds + Extremities: no edema, no cyanosis    The results of significant diagnostics from this hospitalization (including imaging, microbiology, ancillary and laboratory) are listed below for reference.     Microbiology: Recent Results (from the past 240 hour(s))  Resp Panel by RT-PCR (Flu A&B, Covid) Nasopharyngeal Swab     Status: None   Collection Time: 12/16/19  2:44 PM   Specimen: Nasopharyngeal Swab; Nasopharyngeal(NP) swabs in vial transport medium  Result Value Ref Range Status   SARS Coronavirus 2 by RT PCR NEGATIVE NEGATIVE Final    Comment: (NOTE) SARS-CoV-2 target nucleic acids are NOT DETECTED.  The SARS-CoV-2 RNA is generally detectable in upper respiratory specimens during the acute phase of infection. The lowest concentration of SARS-CoV-2 viral copies this assay can detect  is 138 copies/mL. A negative result does not preclude SARS-Cov-2 infection  and should not be used as the sole basis for treatment or other patient management decisions. A negative result may occur with  improper specimen collection/handling, submission of specimen other than nasopharyngeal swab, presence of viral mutation(s) within the areas targeted by this assay, and inadequate number of viral copies(<138 copies/mL). A negative result must be combined with clinical observations, patient history, and epidemiological information. The expected result is Negative.  Fact Sheet for Patients:  EntrepreneurPulse.com.au  Fact Sheet for Healthcare Providers:  IncredibleEmployment.be  This test is no t yet approved or cleared by the [place] FDA and  has been authorized for detection and/or diagnosis of SARS-CoV-2 by FDA under an Emergency Use Authorization (EUA). This EUA will remain  in effect (meaning this test can be used) for the duration of the COVID-19 declaration under Section 564(b)(1) of the Act, 21 U.S.C.section 360bbb-3(b)(1), unless the authorization is terminated  or revoked sooner.       Influenza A by PCR NEGATIVE NEGATIVE Final   Influenza B by PCR NEGATIVE NEGATIVE Final    Comment: (NOTE) The Xpert Xpress SARS-CoV-2/FLU/RSV plus assay is intended as an aid in the diagnosis of influenza from Nasopharyngeal swab specimens and should not be used as a sole basis for treatment. Nasal washings and aspirates are unacceptable for Xpert Xpress SARS-CoV-2/FLU/RSV testing.  Fact Sheet for Patients: EntrepreneurPulse.com.au  Fact Sheet for Healthcare Providers: IncredibleEmployment.be  This test is not yet approved or cleared by the [place] FDA and has been authorized for detection and/or diagnosis of SARS-CoV-2 by FDA under an Emergency Use Authorization (EUA). This EUA will remain in effect (meaning this test can be used) for the duration of the COVID-19 declaration  under Section 564(b)(1) of the Act, 21 U.S.C. section 360bbb-3(b)(1), unless the authorization is terminated or revoked.  Performed at [facility], [address] [place], [place] [address]      Labs: BNP (last 3 results) Recent Labs    12/16/19 1138  BNP 35.3   Basic Metabolic Panel: Recent Labs  Lab 12/16/19 1139 12/17/19 0423  NA 137 135  K 4.0 3.4*  CL 100 102  CO2 27 24  GLUCOSE 131* 99  BUN 19 15  CREATININE 0.98 0.77  CALCIUM 8.7* 8.5*   Liver Function Tests: No results for input(s): AST, ALT, ALKPHOS, BILITOT, PROT, ALBUMIN in the last 168 hours. No results for input(s): LIPASE, AMYLASE in the last 168 hours. No results for input(s): AMMONIA in the last 168 hours. CBC: Recent Labs  Lab 12/16/19 1139 12/17/19 0423  WBC 8.2 7.0  HGB 13.3 11.2*  HCT 40.1 33.9*  MCV 94.4 93.6  PLT 348 294   Cardiac Enzymes: Recent Labs  Lab 12/16/19 1139  CKTOTAL 45   BNP: Invalid input(s): POCBNP CBG: No results for input(s): GLUCAP in the last 168 hours. D-Dimer No results for input(s): DDIMER in the last 72 hours. Hgb A1c No results for input(s): HGBA1C in the last 72 hours. Lipid Profile No results for input(s): CHOL, HDL, LDLCALC, TRIG, CHOLHDL, LDLDIRECT in the last 72 hours. Thyroid function studies Recent Labs    12/16/19 1616  TSH 3.561   Anemia work up Recent Labs    12/16/19 1646 12/17/19 0423  VITAMINB12 146*  --   FOLATE  --  12.9   Urinalysis    Component Value Date/Time   COLORURINE YELLOW (A) 12/16/2019 1448   APPEARANCEUR HAZY (A) 12/16/2019 1448  APPEARANCEUR Cloudy (A) 09/06/2019 1545   LABSPEC 1.013 12/16/2019 1448   LABSPEC 1.010 01/09/2013 0647   PHURINE 6.0 12/16/2019 1448   GLUCOSEU NEGATIVE 12/16/2019 1448   GLUCOSEU Negative 01/09/2013 0647   HGBUR LARGE (A) 12/16/2019 1448   BILIRUBINUR NEGATIVE 12/16/2019 1448   BILIRUBINUR Negative 09/06/2019 1545   BILIRUBINUR Negative 01/09/2013 0647   KETONESUR 5  (A) 12/16/2019 1448   PROTEINUR 30 (A) 12/16/2019 1448   UROBILINOGEN 0.2 09/02/2017 1042   NITRITE NEGATIVE 12/16/2019 1448   LEUKOCYTESUR MODERATE (A) 12/16/2019 1448   LEUKOCYTESUR 2+ 01/09/2013 0647   Sepsis Labs Invalid input(s): PROCALCITONIN,  WBC,  LACTICIDVEN Microbiology Recent Results (from the past 240 hour(s))  Resp Panel by RT-PCR (Flu A&B, Covid) Nasopharyngeal Swab     Status: None   Collection Time: 12/16/19  2:44 PM   Specimen: Nasopharyngeal Swab; Nasopharyngeal(NP) swabs in vial transport medium  Result Value Ref Range Status   SARS Coronavirus 2 by RT PCR NEGATIVE NEGATIVE Final    Comment: (NOTE) SARS-CoV-2 target nucleic acids are NOT DETECTED.  The SARS-CoV-2 RNA is generally detectable in upper respiratory specimens during the acute phase of infection. The lowest concentration of SARS-CoV-2 viral copies this assay can detect is 138 copies/mL. A negative result does not preclude SARS-Cov-2 infection and should not be used as the sole basis for treatment or other patient management decisions. A negative result may occur with  improper specimen collection/handling, submission of specimen other than nasopharyngeal swab, presence of viral mutation(s) within the areas targeted by this assay, and inadequate number of viral copies(<138 copies/mL). A negative result must be combined with clinical observations, patient history, and epidemiological information. The expected result is Negative.  Fact Sheet for Patients:  EntrepreneurPulse.com.au  Fact Sheet for Healthcare Providers:  IncredibleEmployment.be  This test is no t yet approved or cleared by the [place] FDA and  has been authorized for detection and/or diagnosis of SARS-CoV-2 by FDA under an Emergency Use Authorization (EUA). This EUA will remain  in effect (meaning this test can be used) for the duration of the COVID-19 declaration under Section 564(b)(1) of  the Act, 21 U.S.C.section 360bbb-3(b)(1), unless the authorization is terminated  or revoked sooner.       Influenza A by PCR NEGATIVE NEGATIVE Final   Influenza B by PCR NEGATIVE NEGATIVE Final    Comment: (NOTE) The Xpert Xpress SARS-CoV-2/FLU/RSV plus assay is intended as an aid in the diagnosis of influenza from Nasopharyngeal swab specimens and should not be used as a sole basis for treatment. Nasal washings and aspirates are unacceptable for Xpert Xpress SARS-CoV-2/FLU/RSV testing.  Fact Sheet for Patients: EntrepreneurPulse.com.au  Fact Sheet for Healthcare Providers: IncredibleEmployment.be  This test is not yet approved or cleared by the [place] FDA and has been authorized for detection and/or diagnosis of SARS-CoV-2 by FDA under an Emergency Use Authorization (EUA). This EUA will remain in effect (meaning this test can be used) for the duration of the COVID-19 declaration under Section 564(b)(1) of the Act, 21 U.S.C. section 360bbb-3(b)(1), unless the authorization is terminated or revoked.  Performed at [facility], [address] [place], [place] [address]      Time coordinating discharge: Over 30 minutes  SIGNED:   Ezekiel Slocumb, DO [facility] 12/17/2019, 4:44 PM   If 7PM-7AM, please contact night-coverage [email or web address]

## 2019-12-17 NOTE — Progress Notes (Signed)
*  PRELIMINARY RESULTS* Echocardiogram 2D Echocardiogram has been performed.  Wallie Char Lorien Shingler 12/17/2019, 11:30 AM

## 2019-12-17 NOTE — TOC Initial Note (Signed)
Transition of Care [facility]) - Initial/Assessment Note    Patient Details  Name: Monica Singh MRN: [id number] Date of Birth: 1928-05-13  Transition of Care [facility]) CM/SW Contact:    Shelbie Hutching, RN Phone Number: 12/17/2019, 11:11 AM  Clinical Narrative:                 Patient placed under observation for syncope.  Patient reports that she was sitting at her kitchen table yesterday morning and then next thing she knows she is looking up at her table from the floor.  RNCM met with patient and patient's daughter, [name] at the bedside this morning.  Patient lives alone in a condo on the ground floor.  Patient's daughter lives about 5 minutes away from her.  Patient is independent at home, daughter provides transportation.  Patient has a walker and cane at home but does not use them.  Patient and daughter hope that she can be discharged today.   Expected Discharge Plan: Home/Self Care Barriers to Discharge: Continued Medical Work up   Patient Goals and CMS Choice Patient states their goals for this hospitalization and ongoing recovery are:: Ready to go home.      Expected Discharge Plan and Services Expected Discharge Plan: Home/Self Care   Discharge Planning Services: CM Consult   Living arrangements for the past 2 months: Apartment                   DME Agency: NA       HH Arranged: NA          Prior Living Arrangements/Services Living arrangements for the past 2 months: Apartment Lives with:: Self Patient language and need for interpreter reviewed:: Yes Do you feel safe going back to the place where you live?: Yes      Need for Family Participation in Patient Care: Yes (Comment) (sycopal episode) Care giver support system in place?: Yes (comment) (daughter) Current home services: DME (walker and cane) Criminal Activity/Legal Involvement Pertinent to Current Situation/Hospitalization: No - Comment as needed  Activities of Daily Living Home Assistive  Devices/Equipment: None (Daughter says they have equipment but patient doesnt need it) ADL Screening (condition at time of admission) Patient's cognitive ability adequate to safely complete daily activities?: Yes Is the patient deaf or have difficulty hearing?: No Does the patient have difficulty seeing, even when wearing glasses/contacts?: No Does the patient have difficulty concentrating, remembering, or making decisions?: Yes Patient able to express need for assistance with ADLs?: Yes Does the patient have difficulty dressing or bathing?: No Independently performs ADLs?: Yes (appropriate for developmental age) Does the patient have difficulty walking or climbing stairs?: No (Doesnt have stairs) Weakness of Legs: Both Weakness of Arms/Hands: None  Permission Sought/Granted Permission sought to share information with : Case Manager, Family Supports Permission granted to share information with : Yes, Verbal Permission Granted  Share Information with NAME: [place] granted to share info w Relationship: daughter     Emotional Assessment Appearance:: Appears stated age Attitude/Demeanor/Rapport: Engaged Affect (typically observed): Accepting Orientation: : Oriented to Self, Oriented to Place, Oriented to  Time, Oriented to Situation Alcohol / Substance Use: Not Applicable Psych Involvement: No (comment)  Admission diagnosis:  Syncope and collapse [R55] Syncope [R55] Patient Active Problem List   Diagnosis Date Noted  . Syncope 12/16/2019  . Total knee replacement status 12/23/2018  . Obstruction of left ureteropelvic junction (UPJ) 02/26/2018  . Sepsis ([place]) 02/10/2018  . Closed burst fracture  of lumbar vertebra with routine healing 04/01/2017  . Chronic bronchitis (HCC) 02/18/2017  . Osteoporosis 11/20/2015  . COPD (chronic obstructive pulmonary disease) (HCC) 11/20/2015  . Cyst of ovary 07/26/2015  . Status post total left knee replacement 04/03/2015  .  Abnormal chest sounds 08/25/2014  . Allergic rhinitis 08/25/2014  . Colon polyp 08/25/2014  . DDD (degenerative disc disease), cervical 08/25/2014  . DDD (degenerative disc disease), lumbar 08/25/2014  . H/O non-Hodgkin's lymphoma 08/25/2014  . Below normal amount of sodium in the blood 08/25/2014  . Primary osteoarthritis of right knee 08/25/2014  . Subclinical hypothyroidism 08/25/2014  . Adnexal mass 06/03/2014  . Osteoarthritis 03/19/2011  . 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  . History of smoking 03/11/2008   PCP:  Fisher, Donald E, MD Pharmacy:   [facility] - [place], [place] - [address] [place] [address] Phone: [phone] Fax: [phone]  [facility] - [place], [place] - [address] [place]  [address] Phone: [phone] Fax: [phone]     Social Determinants of Health (SDOH) Interventions    Readmission Risk Interventions No flowsheet data found.  

## 2019-12-17 NOTE — Care Management Obs Status (Signed)
[place] NOTIFICATION   Patient Details  Name: Monica Singh MRN: [id number] Date of Birth: Mar 03, 1928   Medicare Observation Status Notification Given:  Yes    Shelbie Hutching, RN 12/17/2019, 9:43 AM

## 2019-12-19 ENCOUNTER — Emergency Department: Payer: Medicare Other

## 2019-12-19 ENCOUNTER — Other Ambulatory Visit: Payer: Self-pay

## 2019-12-19 ENCOUNTER — Encounter: Payer: Self-pay | Admitting: Emergency Medicine

## 2019-12-19 ENCOUNTER — Observation Stay
Admission: EM | Admit: 2019-12-19 | Discharge: 2019-12-21 | Disposition: A | Discharge status: Home / Self Care | Payer: Medicare Other | Attending: Internal Medicine | Admitting: Internal Medicine

## 2019-12-19 ENCOUNTER — Observation Stay: Payer: Medicare Other

## 2019-12-19 DIAGNOSIS — Z87891 Personal history of nicotine dependence: Secondary | ICD-10-CM | POA: Diagnosis not present

## 2019-12-19 DIAGNOSIS — Z882 Allergy status to sulfonamides status: Secondary | ICD-10-CM | POA: Diagnosis not present

## 2019-12-19 DIAGNOSIS — R1011 Right upper quadrant pain: Secondary | ICD-10-CM

## 2019-12-19 DIAGNOSIS — I6523 Occlusion and stenosis of bilateral carotid arteries: Secondary | ICD-10-CM | POA: Diagnosis not present

## 2019-12-19 DIAGNOSIS — Z96652 Presence of left artificial knee joint: Secondary | ICD-10-CM | POA: Insufficient documentation

## 2019-12-19 DIAGNOSIS — I6782 Cerebral ischemia: Secondary | ICD-10-CM | POA: Insufficient documentation

## 2019-12-19 DIAGNOSIS — R4182 Altered mental status, unspecified: Secondary | ICD-10-CM | POA: Diagnosis present

## 2019-12-19 DIAGNOSIS — E538 Deficiency of other specified B group vitamins: Secondary | ICD-10-CM | POA: Diagnosis not present

## 2019-12-19 DIAGNOSIS — N9489 Other specified conditions associated with female genital organs and menstrual cycle: Secondary | ICD-10-CM | POA: Diagnosis present

## 2019-12-19 DIAGNOSIS — R55 Syncope and collapse: Secondary | ICD-10-CM | POA: Diagnosis not present

## 2019-12-19 DIAGNOSIS — R519 Headache, unspecified: Secondary | ICD-10-CM | POA: Insufficient documentation

## 2019-12-19 DIAGNOSIS — N390 Urinary tract infection, site not specified: Secondary | ICD-10-CM

## 2019-12-19 DIAGNOSIS — Z955 Presence of coronary angioplasty implant and graft: Secondary | ICD-10-CM | POA: Insufficient documentation

## 2019-12-19 DIAGNOSIS — Z96651 Presence of right artificial knee joint: Secondary | ICD-10-CM | POA: Diagnosis not present

## 2019-12-19 DIAGNOSIS — E559 Vitamin D deficiency, unspecified: Secondary | ICD-10-CM | POA: Diagnosis present

## 2019-12-19 DIAGNOSIS — Z743 Need for continuous supervision: Secondary | ICD-10-CM | POA: Diagnosis not present

## 2019-12-19 DIAGNOSIS — Z79899 Other long term (current) drug therapy: Secondary | ICD-10-CM | POA: Diagnosis not present

## 2019-12-19 DIAGNOSIS — J449 Chronic obstructive pulmonary disease, unspecified: Secondary | ICD-10-CM | POA: Insufficient documentation

## 2019-12-19 DIAGNOSIS — R404 Transient alteration of awareness: Secondary | ICD-10-CM | POA: Diagnosis present

## 2019-12-19 DIAGNOSIS — R000 Tachycardia, unspecified: Secondary | ICD-10-CM | POA: Diagnosis not present

## 2019-12-19 DIAGNOSIS — N135 Crossing vessel and stricture of ureter without hydronephrosis: Secondary | ICD-10-CM | POA: Diagnosis present

## 2019-12-19 DIAGNOSIS — Z87440 Personal history of urinary (tract) infections: Secondary | ICD-10-CM | POA: Insufficient documentation

## 2019-12-19 DIAGNOSIS — Z8572 Personal history of non-Hodgkin lymphomas: Secondary | ICD-10-CM | POA: Diagnosis not present

## 2019-12-19 DIAGNOSIS — R5381 Other malaise: Secondary | ICD-10-CM | POA: Diagnosis not present

## 2019-12-19 DIAGNOSIS — K219 Gastro-esophageal reflux disease without esophagitis: Secondary | ICD-10-CM | POA: Insufficient documentation

## 2019-12-19 DIAGNOSIS — R69 Illness, unspecified: Secondary | ICD-10-CM | POA: Diagnosis not present

## 2019-12-19 LAB — COMPREHENSIVE METABOLIC PANEL
ALT: 13 U/L (ref 0–44)
AST: 20 U/L (ref 15–41)
Albumin: 4.1 g/dL (ref 3.5–5.0)
Alkaline Phosphatase: 52 U/L (ref 38–126)
Anion gap: 13 (ref 5–15)
BUN: 14 mg/dL (ref 8–23)
CO2: 26 mmol/L (ref 22–32)
Calcium: 9.2 mg/dL (ref 8.9–10.3)
Chloride: 96 mmol/L — ABNORMAL LOW (ref 98–111)
Creatinine, Ser: 1.03 mg/dL — ABNORMAL HIGH (ref 0.44–1.00)
GFR, Estimated: 52 mL/min — ABNORMAL LOW (ref >60)
Glucose, Bld: 156 mg/dL — ABNORMAL HIGH (ref 70–99)
Potassium: 3.4 mmol/L — ABNORMAL LOW (ref 3.5–5.1)
Sodium: 135 mmol/L (ref 135–145)
Total Bilirubin: 0.8 mg/dL (ref 0.3–1.2)
Total Protein: 7.2 g/dL (ref 6.5–8.1)

## 2019-12-19 LAB — CBC WITH DIFFERENTIAL/PLATELET
Abs Immature Granulocytes: 0.04 10*3/uL (ref 0.00–0.07)
Basophils Absolute: 0.1 10*3/uL (ref 0.0–0.1)
Basophils Relative: 1 %
Eosinophils Absolute: 0.3 10*3/uL (ref 0.0–0.5)
Eosinophils Relative: 4 %
HCT: 41.4 % (ref 36.0–46.0)
Hemoglobin: 13.6 g/dL (ref 12.0–15.0)
Immature Granulocytes: 1 %
Lymphocytes Relative: 25 %
Lymphs Abs: 1.6 10*3/uL (ref 0.7–4.0)
MCH: 31.3 pg (ref 26.0–34.0)
MCHC: 32.9 g/dL (ref 30.0–36.0)
MCV: 95.4 fL (ref 80.0–100.0)
Monocytes Absolute: 0.4 10*3/uL (ref 0.1–1.0)
Monocytes Relative: 6 %
Neutro Abs: 4.1 10*3/uL (ref 1.7–7.7)
Neutrophils Relative %: 63 %
Platelets: 335 10*3/uL (ref 150–400)
RBC: 4.34 MIL/uL (ref 3.87–5.11)
RDW: 13 % (ref 11.5–15.5)
WBC: 6.5 10*3/uL (ref 4.0–10.5)
nRBC: 0 % (ref 0.0–0.2)

## 2019-12-19 LAB — TROPONIN I (HIGH SENSITIVITY)
Troponin I (High Sensitivity): 5 ng/L (ref <18)
Troponin I (High Sensitivity): 5 ng/L (ref <18)

## 2019-12-19 LAB — MAGNESIUM: Magnesium: 2.2 mg/dL (ref 1.7–2.4)

## 2019-12-19 MED ORDER — CYANOCOBALAMIN 1000 MCG/ML IJ SOLN
1000.0000 ug | Freq: Every day | INTRAMUSCULAR | Status: DC
  Administered 2019-12-19 – 2019-12-21 (×3): 1000 ug via INTRAMUSCULAR
  Filled 2019-12-19 (×3): qty 1

## 2019-12-19 MED ORDER — IPRATROPIUM-ALBUTEROL 20-100 MCG/ACT IN AERS
1.0000 | INHALATION_SPRAY | Freq: Every day | RESPIRATORY_TRACT | Status: DC
  Administered 2019-12-19 – 2019-12-21 (×3): 1 via RESPIRATORY_TRACT
  Filled 2019-12-19: qty 4

## 2019-12-19 MED ORDER — VITAMIN D (ERGOCALCIFEROL) 1.25 MG (50000 UNIT) PO CAPS
50000.0000 [IU] | ORAL_CAPSULE | ORAL | Status: DC
  Administered 2019-12-20: 09:00:00 50000 [IU] via ORAL
  Filled 2019-12-19: qty 1

## 2019-12-19 MED ORDER — ACETAMINOPHEN 650 MG RE SUPP: 650.0000 mg | Freq: Four times a day (QID) | RECTAL | Status: DC | PRN

## 2019-12-19 MED ORDER — VITAMIN B-12 1000 MCG PO TABS
1000.0000 ug | ORAL_TABLET | Freq: Every day | ORAL | Status: DC
  Administered 2019-12-19: 16:00:00 1000 ug via ORAL
  Filled 2019-12-19: qty 1

## 2019-12-19 MED ORDER — ENOXAPARIN SODIUM 30 MG/0.3ML ~~LOC~~ SOLN
30.0000 mg | SUBCUTANEOUS | Status: DC
  Administered 2019-12-19: 30 mg via SUBCUTANEOUS
  Filled 2019-12-19: qty 0.3

## 2019-12-19 MED ORDER — POTASSIUM CHLORIDE CRYS ER 20 MEQ PO TBCR
20.0000 meq | EXTENDED_RELEASE_TABLET | Freq: Once | ORAL | Status: AC
  Administered 2019-12-19: 20 meq via ORAL
  Filled 2019-12-19: qty 1

## 2019-12-19 MED ORDER — ACETAMINOPHEN 325 MG PO TABS: 650.0000 mg | ORAL_TABLET | Freq: Four times a day (QID) | ORAL | Status: DC | PRN

## 2019-12-19 MED ORDER — ONDANSETRON HCL 4 MG/2ML IJ SOLN: 4.0000 mg | Freq: Four times a day (QID) | INTRAMUSCULAR | Status: DC | PRN

## 2019-12-19 MED ORDER — ONDANSETRON HCL 4 MG PO TABS: 4.0000 mg | ORAL_TABLET | Freq: Four times a day (QID) | ORAL | Status: DC | PRN

## 2019-12-19 MED ORDER — FOLIC ACID 1 MG PO TABS
1.0000 mg | ORAL_TABLET | Freq: Every day | ORAL | Status: DC
  Administered 2019-12-19 – 2019-12-21 (×3): 1 mg via ORAL
  Filled 2019-12-19 (×3): qty 1

## 2019-12-19 MED ORDER — GADOBUTROL 1 MMOL/ML IV SOLN
5.0000 mL | Freq: Once | INTRAVENOUS | Status: AC | PRN
  Administered 2019-12-19: 7.5 mL via INTRAVENOUS

## 2019-12-19 NOTE — H&P (Signed)
History and Physical   [name] ALP:[id number] DOB: 12-30-1928 DOA: 12/19/2019  PCP: Birdie Sons, MD  Outpatient Specialists: [facility] Patient coming from: Home  I have personally briefly reviewed patient's old medical records in [place].  Chief Concern: Mental status change/loss of consciousness  HPI: [place] H Calzada is a 86 y.o. female with medical history significant for recurrent/frequent UTI, history of left UPJ obstruction managed with indwelling urethral stent last exchanged by Dr. Jeb Levering on 05/21/2019, diffuse large cell lymphoma stage III, CD20 positive, status post 6 cycles of R-CHOP, recent hospitalization for mental status change and loss of consciousness staring up at the kitchen table.   She was brought by her daughter who presented to the emergency department for chief concerns of staring off into space, then slumping over at the breakfast table.  After she had return of consciousness, daughter reports patient had nausea and vomited her breakfast.  Daughter denies seizure activity, urinary or bowel incontinence.  Patient endorses baseline headaches.  Review of system was negative for vision changes, dysphagia, fever, chills, cough, chest pain, shortness of breath, abdominal pain, palpitations, dysuria, hematuria, diarrhea.  History: Lives by herself, retired and formerly worked in garden [employer], quit smoking in 1991 (quarter of a pack per day), drinks 2 glasses of wine per night, denies recreational drug use.  ED Course: Discussed with ED provider, patient will benefit from neurological work-up for mental status change/loss of consciousness.  Review of Systems: As per HPI otherwise 10 point review of systems negative. Per ED provider, she reported to ED provider that she had right upper quadrant abdominal pain.  Limited abdominal ultrasound was ordered and was read as no acute findings.  Assessment/Plan  Principal  Problem:   Staring episodes Active Problems:   Adnexal mass   H/O non-Hodgkin's lymphoma   Obstruction of left ureteropelvic junction (UPJ)   Syncope   B12 deficiency   Vitamin D deficiency   Frequent UTI   Staring episodes with loss of consciousness-query metabolic encephalopathy versus seizure versus CNS abnormality not visualized on CT imaging -CT of the head without contrast on 12/16/2019 read as no evidence of acute intracranial abnormality.  Mild cerebral atrophy with advanced chronic small vessel ischemic disease, progress as compared to head CT of 01/09/2013. -Consulted neurology: Recommended ultrasound carotid bilateral -MRI of the brain, MRA of the head  B12 deficiency-IM injections with B12 at 1000 mcg IM daily for 7 days, then change to PO supplementations per neurology recommendations  Vitamin D deficiency-oral replacement  Recurrent UTI  Left chronic UPJ obstruction status post indwelling chronic urethral stents placement-outpatient follow-up Chart reviewed.   DVT prophylaxis: Enoxaparin Code Status: DNR Diet: Cardiac Family Communication: Updated daughter, [name] at bedside Disposition Plan: Pending clinical course Consults called: Neurology Admission status: Observation with telemetry  Past Medical History:  Diagnosis Date  . Actinic keratosis   . Adnexal mass 06/03/2014   since 2009  . Arthritis   . Complication of anesthesia   . COPD (chronic obstructive pulmonary disease) ([place])   . Diffuse large cell lymphoma in remission ([place]) 01/2007   NON-HODGKINS-stage 3, cd 20 positive; status post 6 cycles of R-CHOP  . Dyspnea    with exertion  . GERD (gastroesophageal reflux disease)   . Herpes zoster without complication 04/01/3297  . History of hiatal hernia   . History of kidney stones   . HOH (hard of hearing)    wears hearing aides  . Non Hodgkin's lymphoma ([place])   .  Personal history of chemotherapy   . PONV (postoperative nausea and vomiting)    in  January lasted about 4 hours  . Recurrent UTI   . Squamous cell carcinoma of skin 11/09/2008   Right post. lat. elbow. SCCis arising in [place]. Excised 01/04/2009, margins free.    Past Surgical History:  Procedure Laterality Date  . ABDOMINAL SURGERY  2009   abdominal mass+ NH lymphoma,  . BACK SURGERY  01/2017   fusion. metal plate in neck at back  . CATARACT EXTRACTION  1997   right eye and left ey  . cervical neck fusion  1995  . CYSTOSCOPY W/ RETROGRADES Left 01/30/2018   Procedure: CYSTOSCOPY WITH RETROGRADE PYELOGRAM;  Surgeon: Billey Co, MD;  Location: [facility] ORS;  Service: Urology;  Laterality: Left;  . CYSTOSCOPY W/ RETROGRADES Left 08/05/2018   Procedure: CYSTOSCOPY WITH RETROGRADE PYELOGRAM;  Surgeon: Billey Co, MD;  Location: [facility] OR[facility];  Service: Urology;  Laterality: Left;  . CYSTOSCOPY W/ URETERAL STENT PLACEMENT Left 08/05/2018   Procedure: CYSTOSCOPY WITH STENT Exchange;  Surgeon: Billey Co, MD;  Location: [facility];  Service: Urology;  Laterality: Left;  . CYSTOSCOPY W/ URETERAL STENT PLACEMENT Left 05/21/2019   Procedure: CYSTOSCOPY WITH STENT REPLACEMENT;  Surgeon: Billey Co, MD;  Location: [facility];  Service: Urology;  Laterality: Left;  . CYSTOSCOPY WITH BIOPSY Left 02/27/2018   Procedure: CYSTOSCOPY WITH BIOPSY;  Surgeon: Billey Co, MD;  Location: [facility];  Service: Urology;  Laterality: Left;  . CYSTOSCOPY WITH STENT PLACEMENT Left 01/30/2018   Procedure: CYSTOSCOPY WITH STENT PLACEMENT;  Surgeon: Billey Co, MD;  Location: [facility];  Service: Urology;  Laterality: Left;  . CYSTOSCOPY WITH URETEROSCOPY AND STENT PLACEMENT Left 02/27/2018   Procedure: CYSTOSCOPY WITH URETEROSCOPY AND STENT Exchange;  Surgeon: Billey Co, MD;  Location: [facility];  Service: Urology;  Laterality: Left;  . DENTAL SURGERY     screws  . JOINT REPLACEMENT Left    knee  . KNEE ARTHROPLASTY Right 12/23/2018   Procedure: RIGHT COMPUTER ASSISTED TOTAL KNEE  ARTHROPLASTY;  Surgeon: Dereck Leep, MD;  Location: [facility];  Service: Orthopedics;  Laterality: Right;  . KYPHOPLASTY N/A 02/21/2017   Procedure: [id number];  Surgeon: Hessie Knows, MD;  Location: [facility];  Service: Orthopedics;  Laterality: N/A;  . laparotomy with biopsy  03/02/2007  . PORTACATH PLACEMENT  2009  . [place]  . SQUAMOUS CELL CARCINOMA EXCISION     right arm  . TOTAL KNEE ARTHROPLASTY Left    2009  . URETEROSCOPY Left 01/30/2018   Procedure: URETEROSCOPY;  Surgeon: Billey Co, MD;  Location: [facility];  Service: Urology;  Laterality: Left;  Marland Kitchen VAGINAL HYSTERECTOMY  1971    Social History:  reports that she quit smoking about 30 years ago. Her smoking use included cigarettes. She smoked 0.25 packs per day. She has never used smokeless tobacco. She reports current alcohol use of about 14.0 standard drinks of alcohol per week. She reports that she does not use drugs.  Allergies  Allergen Reactions  . Milk-Related Compounds Diarrhea and Nausea And Vomiting    Gas too  . Lac Bovis Diarrhea and Nausea And Vomiting    Milk allergy   . Sulfa Antibiotics Hives and Itching   Family History  Problem Relation Age of Onset  . Breast cancer Sister   . Dementia Sister   . Cataracts Sister   . Heart attack Brother   .  CAD Brother   . Heart attack Brother   . Leukemia Grandchild        granddaughter  . Kidney disease Neg Hx   . Bladder Cancer Neg Hx    Family history: Family history reviewed and not pertinent  Prior to Admission medications   Medication Sig Start Date End Date Taking? Authorizing Provider  acetaminophen (TYLENOL) 500 MG tablet Take 1,000 mg by mouth every 6 (six) hours as needed for moderate pain.     [provider]  COMBIVENT RESPIMAT 20-100 MCG/ACT AERS respimat INHALE 2 PUFFS INTO THE LUNGS 3 TIMES DAILY Patient taking differently: Inhale 1 puff into the lungs daily.  12/11/19   Birdie Sons, MD  folic acid (FOLVITE)  1 MG tablet Take 1 tablet (1 mg total) by mouth daily. 12/18/19   Ezekiel Slocumb, DO  vitamin B-12 1000 MCG tablet Take 1 tablet (1,000 mcg total) by mouth daily. 12/18/19   Ezekiel Slocumb, DO  Vitamin D, Ergocalciferol, (DRISDOL) 1.25 MG (50000 UNIT) CAPS capsule Take 1 capsule (50,000 Units total) by mouth every 7 (seven) days. 12/24/19   Ezekiel Slocumb, DO   Physical Exam: Vitals:   12/19/19 1215 12/19/19 1230 12/19/19 1437 12/19/19 1607  BP: (!) 116/58 (!) 134/54 119/72 125/65  Pulse: 89 91 83 87  Resp: (!) 21 15 20 19   Temp:   98.5 F (36.9 C) 98 F (36.7 C)  TempSrc:   Oral Oral  SpO2: 93% 95% 100% 100%  Weight:      Height:       Constitutional: appears age-appropriate, NAD, calm, comfortable Eyes: PERRL, lids and conjunctivae normal ENMT: Mucous membranes are moist. Posterior pharynx clear of any exudate or lesions. Age-appropriate dentition. Hearing appropriate Neck: normal, supple, no masses, no thyromegaly Respiratory: clear to auscultation bilaterally, no wheezing, no crackles. Normal respiratory effort. No accessory muscle use.  Cardiovascular: Regular rate and rhythm, no murmurs / rubs / gallops. No extremity edema. 2+ pedal pulses. No carotid bruits.  Abdomen: no tenderness, no masses palpated, no hepatosplenomegaly. Bowel sounds positive.  Musculoskeletal: no clubbing / cyanosis. No joint deformity upper and lower extremities. Good ROM, no contractures, no atrophy. Normal muscle tone.  Skin: no rashes, lesions, ulcers. No induration Neurologic: Sensation intact. Strength 5/5 in all 4.  Psychiatric: Normal judgment and insight. Alert and oriented x 3. Normal mood.   EKG: Independently reviewed, showing sinus tachycardia with rate of 113, QTC 507  Imaging on Admission: Personally reviewed and I agree with radiologist reading as below.  US Carotid Bilateral  Result Date: 12/19/2019 CLINICAL DATA:  Syncope. EXAM: BILATERAL CAROTID DUPLEX ULTRASOUND TECHNIQUE:  Pearline Cables scale imaging, color Doppler and duplex ultrasound were performed of bilateral carotid and vertebral arteries in the neck. COMPARISON:  None. FINDINGS: Criteria: Quantification of carotid stenosis is based on velocity parameters that correlate the residual internal carotid diameter with NASCET-based stenosis levels, using the diameter of the distal internal carotid lumen as the denominator for stenosis measurement. The following velocity measurements were obtained: RIGHT ICA: 74/16 cm/sec CCA: 425/95 cm/sec SYSTOLIC ICA/CCA RATIO:  0.6 ECA: 50 cm/sec LEFT ICA: 99/21 cm/sec CCA: 63/87 cm/sec SYSTOLIC ICA/CCA RATIO:  1.2 ECA: 48 cm/sec RIGHT CAROTID ARTERY: Small amount of plaque in the right common carotid artery. External carotid artery is patent with normal waveform. Normal waveforms and velocities in the internal carotid artery. RIGHT VERTEBRAL ARTERY: Antegrade flow and normal waveform in the right vertebral artery. LEFT CAROTID ARTERY: Echogenic plaque in the  distal common carotid artery and bulb. External carotid artery is patent with normal waveform. Echogenic plaque with shadowing in the proximal internal carotid artery. Normal waveforms and velocities in the internal carotid artery. LEFT VERTEBRAL ARTERY: Antegrade flow and normal waveform in the left vertebral artery. IMPRESSION: 1. Atherosclerotic plaque involving the bilateral carotid arteries. Plaque is most prominent in the proximal left internal carotid artery. Estimated degree of stenosis in the internal carotid arteries is less than 50% bilaterally. 2. Patent vertebral arteries with antegrade flow. Electronically Signed   By: Markus Daft M.D.   On: 12/19/2019 17:47   US Abdomen Limited RUQ (LIVER/GB)  Result Date: 12/19/2019 CLINICAL DATA:  Right upper quadrant pain. EXAM: ULTRASOUND ABDOMEN LIMITED RIGHT UPPER QUADRANT COMPARISON:  None. FINDINGS: Gallbladder: No gallstones or wall thickening visualized. No sonographic Murphy sign noted by  sonographer. Common bile duct: Diameter: 1.2 mm. Liver: No focal lesion identified. Within normal limits in parenchymal echogenicity. Portal vein is patent on color Doppler imaging with normal direction of blood flow towards the liver. Other: None. IMPRESSION: No acute findings. Electronically Signed   By: Marin Olp M.D.   On: 12/19/2019 13:39   Labs on Admission: I have personally reviewed following labs  CBC: Recent Labs  Lab 12/16/19 1139 12/17/19 0423 12/19/19 1021  WBC 8.2 7.0 6.5  NEUTROABS  --   --  4.1  HGB 13.3 11.2* 13.6  HCT 40.1 33.9* 41.4  MCV 94.4 93.6 95.4  PLT 348 294 161   Basic Metabolic Panel: Recent Labs  Lab 12/16/19 1139 12/17/19 0423 12/19/19 1021  NA 137 135 135  K 4.0 3.4* 3.4*  CL 100 102 96*  CO2 27 24 26   GLUCOSE 131* 99 156*  BUN 19 15 14   CREATININE 0.98 0.77 1.03*  CALCIUM 8.7* 8.5* 9.2  MG  --   --  2.2   GFR: Estimated Creatinine Clearance: 32.4 mL/min (A) (by C-G formula based on SCr of 1.03 mg/dL (H)). Liver Function Tests: Recent Labs  Lab 12/19/19 1021  AST 20  ALT 13  ALKPHOS 52  BILITOT 0.8  PROT 7.2  ALBUMIN 4.1   Cardiac Enzymes: Recent Labs  Lab 12/16/19 1139  CKTOTAL 45   Anemia Panel: Recent Labs    12/17/19 0423  FOLATE 12.9   Urine analysis:    Component Value Date/Time   COLORURINE YELLOW (A) 12/16/2019 1448   APPEARANCEUR HAZY (A) 12/16/2019 1448   APPEARANCEUR Cloudy (A) 09/06/2019 1545   LABSPEC 1.013 12/16/2019 1448   LABSPEC 1.010 01/09/2013 0647   PHURINE 6.0 12/16/2019 1448   GLUCOSEU NEGATIVE 12/16/2019 1448   GLUCOSEU Negative 01/09/2013 0647   HGBUR LARGE (A) 12/16/2019 1448   BILIRUBINUR NEGATIVE 12/16/2019 1448   BILIRUBINUR Negative 09/06/2019 1545   BILIRUBINUR Negative 01/09/2013 0647   KETONESUR 5 (A) 12/16/2019 1448   PROTEINUR 30 (A) 12/16/2019 1448   UROBILINOGEN 0.2 09/02/2017 1042   NITRITE NEGATIVE 12/16/2019 1448   LEUKOCYTESUR MODERATE (A) 12/16/2019 1448    LEUKOCYTESUR 2+ 01/09/2013 0647   Gardy Montanari N Treylan Mcclintock D.O. [facility]  If 12AM-7AM, please contact overnight-coverage provider If 7AM-7PM, please contact day coverage provider [email or web address]  12/19/2019, 6:39 PM

## 2019-12-19 NOTE — ED Provider Notes (Signed)
[facility] Provider Note ____________________________________________   First MD Initiated Contact with Patient 12/19/19 1025     (approximate)  I have reviewed the triage vital signs and the nursing notes.   HISTORY  Chief Complaint Weakness    HPI Monica Singh is a 86 y.o. female with PMH as noted below who presents for an apparent episode of altered mental status.  Per EMS, the family called because the patient was staring off into the distance, not responding appropriately, and became diaphoretic.  When asked why she is here, the patient herself states that she felt lightheaded and then woke up under the table.  However, when I pointed out that this is what she came into the hospital with several days ago, she seemed to to not remember being admitted to the hospital last week.  She has no memory of the episode that happened today.  She denies any acute complaints at this time.  During my initial exam there was no family present to obtain further history.  Past Medical History:  Diagnosis Date  . Actinic keratosis   . Adnexal mass 06/03/2014   since 2009  . Arthritis   . Complication of anesthesia   . COPD (chronic obstructive pulmonary disease) ([place])   . Diffuse large cell lymphoma in remission ([place]) 01/2007   NON-HODGKINS-stage 3, cd 20 positive; status post 6 cycles of R-CHOP  . Dyspnea    with exertion  . GERD (gastroesophageal reflux disease)   . Herpes zoster without complication 0/07/6224  . History of hiatal hernia   . History of kidney stones   . HOH (hard of hearing)    wears hearing aides  . Non Hodgkin's lymphoma ([place])   . Personal history of chemotherapy   . PONV (postoperative nausea and vomiting)    in January lasted about 4 hours  . Recurrent UTI   . Squamous cell carcinoma of skin 11/09/2008   Right post. lat. elbow. SCCis arising in [place]. Excised 01/04/2009, margins free.     Patient Active Problem  List   Diagnosis Date Noted  . Staring episodes 12/19/2019  . Syncope 12/16/2019  . Total knee replacement status 12/23/2018  . Obstruction of left ureteropelvic junction (UPJ) 02/26/2018  . Sepsis ([place]) 02/10/2018  . Closed burst fracture of lumbar vertebra with routine healing 04/01/2017  . Chronic bronchitis ([place]) 02/18/2017  . Osteoporosis 11/20/2015  . COPD (chronic obstructive pulmonary disease) ([place]) 11/20/2015  . Cyst of ovary 07/26/2015  . Status post total left knee replacement 04/03/2015  . Abnormal chest sounds 08/25/2014  . Allergic rhinitis 08/25/2014  . Colon polyp 08/25/2014  . DDD (degenerative disc disease), cervical 08/25/2014  . DDD (degenerative disc disease), lumbar 08/25/2014  . H/O non-Hodgkin's lymphoma 08/25/2014  . Below normal amount of sodium in the blood 08/25/2014  . Primary osteoarthritis of right knee 08/25/2014  . Subclinical hypothyroidism 08/25/2014  . Adnexal mass 06/03/2014  . Osteoarthritis 03/19/2011  . 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 History of smoking 03/11/2008    Past Surgical History:  Procedure Laterality Date  . ABDOMINAL SURGERY  2009   abdominal mass+ NH lymphoma,  . BACK SURGERY  01/2017   fusion. metal plate in neck at back  . CATARACT EXTRACTION  1997   right eye and left ey  . cervical neck fusion  1995  . CYSTOSCOPY W/ RETROGRADES Left 01/30/2018   Procedure: CYSTOSCOPY WITH RETROGRADE PYELOGRAM;  Surgeon: Billey Co, MD;  Location: [facility];  Service: Urology;  Laterality: Left;  . CYSTOSCOPY W/ RETROGRADES Left 08/05/2018   Procedure: CYSTOSCOPY WITH RETROGRADE PYELOGRAM;  Surgeon: Billey Co, MD;  Location: [facility];  Service: Urology;  Laterality: Left;  . CYSTOSCOPY W/ URETERAL STENT PLACEMENT Left 08/05/2018   Procedure: CYSTOSCOPY WITH STENT Exchange;  Surgeon: Billey Co, MD;  Location: [facility];  Service: Urology;  Laterality: Left;  . CYSTOSCOPY W/ URETERAL STENT PLACEMENT Left 05/21/2019    Procedure: CYSTOSCOPY WITH STENT REPLACEMENT;  Surgeon: Billey Co, MD;  Location: [facility];  Service: Urology;  Laterality: Left;  . CYSTOSCOPY WITH BIOPSY Left 02/27/2018   Procedure: CYSTOSCOPY WITH BIOPSY;  Surgeon: Billey Co, MD;  Location: [facility];  Service: Urology;  Laterality: Left;  . CYSTOSCOPY WITH STENT PLACEMENT Left 01/30/2018   Procedure: CYSTOSCOPY WITH STENT PLACEMENT;  Surgeon: Billey Co, MD;  Location: [facility];  Service: Urology;  Laterality: Left;  . CYSTOSCOPY WITH URETEROSCOPY AND STENT PLACEMENT Left 02/27/2018   Procedure: CYSTOSCOPY WITH URETEROSCOPY AND STENT Exchange;  Surgeon: Billey Co, MD;  Location: [facility];  Service: Urology;  Laterality: Left;  . DENTAL SURGERY     screws  . JOINT REPLACEMENT Left    knee  . KNEE ARTHROPLASTY Right 12/23/2018   Procedure: RIGHT COMPUTER ASSISTED TOTAL KNEE ARTHROPLASTY;  Surgeon: Dereck Leep, MD;  Location: [facility];  Service: Orthopedics;  Laterality: Right;  . KYPHOPLASTY N/A 02/21/2017   Procedure: [id number];  Surgeon: Hessie Knows, MD;  Location: [facility];  Service: Orthopedics;  Laterality: N/A;  . laparotomy with biopsy  03/02/2007  . PORTACATH PLACEMENT  2009  . [place]  . SQUAMOUS CELL CARCINOMA EXCISION     right arm  . TOTAL KNEE ARTHROPLASTY Left    2009  . URETEROSCOPY Left 01/30/2018   Procedure: URETEROSCOPY;  Surgeon: Billey Co, MD;  Location: [facility];  Service: Urology;  Laterality: Left;  Marland Kitchen VAGINAL HYSTERECTOMY  1971    Prior to Admission medications   Medication Sig Start Date End Date Taking? Authorizing Provider  COMBIVENT RESPIMAT 20-100 MCG/ACT AERS respimat INHALE 2 PUFFS INTO THE LUNGS 3 TIMES DAILY Patient taking differently: Inhale 1 puff into the lungs daily.  12/11/19  Yes Birdie Sons, MD  folic acid (FOLVITE) 1 MG tablet Take 1 tablet (1 mg total) by mouth daily. 12/18/19  Yes Nicole Kindred A, DO  vitamin B-12 1000 MCG tablet  Take 1 tablet (1,000 mcg total) by mouth daily. 12/18/19  Yes Ezekiel Slocumb, DO  Vitamin D, Ergocalciferol, (DRISDOL) 1.25 MG (50000 UNIT) CAPS capsule Take 1 capsule (50,000 Units total) by mouth every 7 (seven) days. 12/24/19  Yes Nicole Kindred A, DO  acetaminophen (TYLENOL) 500 MG tablet Take 1,000 mg by mouth every 6 (six) hours as needed for moderate pain.     [provider]    Allergies Milk-related compounds, Lac bovis, and Sulfa antibiotics  Family History  Problem Relation Age of Onset  . Breast cancer Sister   . Dementia Sister   . Cataracts Sister   . Heart attack Brother   . CAD Brother   . Heart attack Brother   . Leukemia Grandchild        granddaughter  . Kidney disease Neg Hx   . Bladder Cancer Neg Hx     Social History Social History   Tobacco Use  . Smoking status: Former Smoker    Packs/day: 0.25  Types: Cigarettes    Quit date: 06/21/1989    Years since quitting: 30.5  . Smokeless tobacco: Never Used  Vaping Use  . Vaping Use: Never used  Substance Use Topics  . Alcohol use: Yes    Alcohol/week: 14.0 standard drinks    Types: 14 Glasses of wine per week    Comment: 1 glass a night  . Drug use: No    Review of Systems  Constitutional: No fever. Eyes: No visual changes. ENT: No sore throat. Cardiovascular: Denies chest pain. Respiratory: Denies shortness of breath. Gastrointestinal: No vomiting or diarrhea.  Genitourinary: Negative for dysuria.  Musculoskeletal: Negative for back pain. Skin: Negative for rash. Neurological: Negative for headache.   ____________________________________________   PHYSICAL EXAM:  VITAL SIGNS: ED Triage Vitals  Enc Vitals Group     BP 12/19/19 1018 (!) 134/103     Pulse Rate 12/19/19 1018 86     Resp 12/19/19 1018 19     Temp 12/19/19 1018 98.3 F (36.8 C)     Temp Source 12/19/19 1018 Oral     SpO2 12/19/19 1018 98 %     Weight 12/19/19 1015 140 lb (63.5 kg)     Height 12/19/19  1015 5\' 2"  (1.575 m)     Head Circumference --      Peak Flow --      Pain Score 12/19/19 1015 0     Pain Loc --      Pain Edu? --      Excl. in [place]? --     Constitutional: Alert and oriented. Well appearing for age, in no acute distress. Eyes: Conjunctivae are normal.  EOMI.  PERRLA. Head: Atraumatic. Nose: No congestion/rhinnorhea. Mouth/Throat: Mucous membranes are moist.   Neck: Normal range of motion.  Cardiovascular: Normal rate, regular rhythm. Grossly normal heart sounds.  Good peripheral circulation. Respiratory: Normal respiratory effort.  No retractions. Lungs CTAB. Gastrointestinal: No distention.  Musculoskeletal: No lower extremity edema.  Extremities warm and well perfused.  Neurologic:  Normal speech and language.  5/5 motor strength and intact sensation to all extremities.  Normal coordination.  No pronator drift.  No facial droop.   Skin:  Skin is warm and dry. No rash noted. Psychiatric: Mood and affect are normal. Speech and behavior are normal.  ____________________________________________   LABS (all labs ordered are listed, but only abnormal results are displayed)  Labs Reviewed  COMPREHENSIVE METABOLIC PANEL - Abnormal; Notable for the following components:      Result Value   Potassium 3.4 (*)    Chloride 96 (*)    Glucose, Bld 156 (*)    Creatinine, Ser 1.03 (*)    GFR, Estimated 52 (*)    All other components within normal limits  CBC WITH DIFFERENTIAL/PLATELET  URINALYSIS, COMPLETE (UACMP) WITH MICROSCOPIC  URINE DRUG SCREEN, QUALITATIVE ([facility] ONLY)  TROPONIN I (HIGH SENSITIVITY)  TROPONIN I (HIGH SENSITIVITY)   ____________________________________________  EKG  ED ECG REPORT I, Arta Silence, the attending physician, personally viewed and interpreted this ECG.  Date: 12/19/2019 EKG Time: 1019 Rate: 113 Rhythm: Sinus tachycardia QRS Axis: normal Intervals: Incomplete LBBB, prolonged QTc ST/T Wave abnormalities: normal Narrative  Interpretation: no evidence of acute ischemia; interpretation limited due to poor EKG baseline in inferior leads  ____________________________________________  RADIOLOGY    ____________________________________________   PROCEDURES  Procedure(s) performed: No  Procedures  Critical Care performed: No ____________________________________________   INITIAL IMPRESSION / ASSESSMENT AND PLAN / ED COURSE  Pertinent labs & imaging  results that were available during my care of the patient were reviewed by me and considered in my medical decision making (see chart for details).  86 year old female with PMH as noted above presents for an episode in which she was staring off into the distance and not responding appropriately.  The patient has no memory of this.  I reviewed the past medical records in [place].  The patient was just admitted overnight on 11/18 after a syncopal episode in which she apparently fully lost consciousness.  She had unremarkable labs and a CT head which was negative for acute findings.  Echocardiogram showed no significant findings except for mitral regurgitation.  On exam, the patient is overall well-appearing.  Her vital signs are normal.  She is alert and oriented x4 currently, although does not remember anything about the episode today and also does not remember that she was just in the hospital for a syncope work-up.  Neurologic exam is normal.  The physical exam is otherwise unremarkable.  Differential primary includes syncope (possibly cardiac in origin) versus some type of seizure.  Given the negative CT a few days ago and lack of Monica head trauma, there is no indication for repeat imaging of the brain.  If the lab work-up is unremarkable, I anticipate repeat admission for further work-up including possible EEG.  ----------------------------------------- 1:56 PM on 12/19/2019 -----------------------------------------  Lab work-up is unremarkable.  The patient  remains alert and oriented.  I got additional history from the daughter who is now here.  She confirms that the patient was sitting at the table after breakfast and appeared to stare off into the distance.  She slumped forward although did not lose consciousness.  She reports an additional episode of this in September.  She also reports that the patient has been having some intermittent right upper quadrant pain.  She has no acute pain there now, but I will obtain an ultrasound to further evaluate.  I discussed the case with Dr. Tobie Poet from the hospitalist service for admission. ____________________________________________   FINAL CLINICAL IMPRESSION(S) / ED DIAGNOSES  Final diagnoses:  Right upper quadrant abdominal pain      [facility] MEDICATIONS STARTED DURING THIS VISIT:  Monica Prescriptions   No medications on file     Note:  This document was prepared using Dragon voice recognition software and may include unintentional dictation errors.    Arta Silence, MD 12/19/19 1357

## 2019-12-19 NOTE — ED Notes (Signed)
Advised nurse that patient has assigned bed 

## 2019-12-19 NOTE — Consult Note (Signed)
Neurology Consultation Reason for Consult: Loss of consciousness  Requesting Physician: Dr. Rupert Stacks   CC: "I want to go home"  History is obtained from: Chart review, patient and daughter Monica Singh   HPI: Monica Singh is a 86 y.o. female with past medical history significant for COPD, recurrent UTIs and left UPJ stent (last exchanged 05/21/2019), and diffuse large cell lymphoma in remission.   The patient has difficulties with spontaneous recall but can correctly endorse/deny things when given choices which is frequently her baseline per family. Otherwise at baseline she is independent, manages her own finances and medications (of which she only takes an inhaler), has not had any issues paying her bills, lives independently in a [place], and owns a car though she does not drive in the [place] area. She has had an episode of confusion in the past attributed to having a UTI where while driving she ended up lost in [place]. She does also own a [place] home on [place] where she will drive short distances to visit friend.  Prior to last Thursday she had not had any episodes similar to this. On Thursday she describes being at the kitchen table and feeling hot, and waking up on the floor looking up at her kitchen table. She did not sustain any injuries during her descent from the chair to the floor, however she had some generalized weakness and had to drag herself across the floor to her couch. She estimates that she had come to around 9:30 AM on the floor and her daughter arrived at 10:00 AM. She was significantly confused for example she did not remember she had a lifeline button that she could press in case of falls. Her daughter describes her as appearing pale and speaking extremely slowly on her arrival. Monica Singh assisted her mother to the bathroom because her mother reported needing to have a bowel movement. As she sat down to have a bowel movement she felt quite nauseated (which she reports is  very common when she has pain although she specifically did not have pain at this time), and she began to vomit. She was brought to the ED for further evaluation. She was admitted and carotid ultrasounds (less than 50% stenosis bilaterally) and echocardiogram (mildly dilated left atrium, mild to moderate mitral regurgitation, mild to moderate tricuspid valve regurgitation) were performed). Work-up is revealing for low vitamin B12 and low vitamin D levels, and normal thyroid studies. CK and troponin are bland. There was concern for potential fluoroquinolone contribution to her symptoms and her most recent course of antibiotics was reduced from a planned 10-day course to an 8.5-day course.  Notably she had several large bowel movements during her hospital stay without any additional symptoms. She was discharged on Friday and her daughter Monica Singh slept with her that night. Saturday was unremarkable and family was with her throughout the day. Sunday morning, the family was sitting at the kitchen table and eating together. The patient did have a sausage biscuit and was working on her second cup of coffee when she had an acute change in her facial expression. Her face seemed to be distorted, she was not talking, and then she slumped over. While the family was applying a washcloth to her face she then became acutely severely diaphoretic. She remained minimally responsive for about 5 minutes and then had an episode of emesis while still sitting at the table. EMS was called, and she remained confused when they arrived 10 minutes later however she again needed to  have a large bowel movement. Per ED provider notes, she remained confused initially in the ED, but by the time her daughter was allowed back to the ED room about an hour after the event onset, the patient was back to normal. There is some suggestion that her mental status was overall slowly clearing during this process although it is difficult to be sure.    Regarding her UTIs, she was frequently placed on ciprofloxacin.  Per Dr. Alyse Low notes, complete 4 days of ciprofloxacin and 4/23, 7-day course starting 7/23, and another course 9/18 as well as 8.5 day course 11/9  Regarding her cancer history, she had non-Hodgkin's diffuse large B-cell lymphoma, CD20 positive, incompletely resected due to involvement of her aorta and major venous structures per her report. She was then treated with 6 cycles of R-CHOP, following which she has been in remission. She is also status post hysterectomy and has been followed for a stable left adnexal mass since 2009  LKW: 11/21 during breakfast tPA given?: No, due to back to baseline and out of window at time of my eval  ROS: A 14 point ROS was performed and is negative except as noted in the HPI.   Past Medical History:  Diagnosis Date  . Actinic keratosis   . Adnexal mass 06/03/2014   since 2009  . Arthritis   . Complication of anesthesia   . COPD (chronic obstructive pulmonary disease) ([place])   . Diffuse large cell lymphoma in remission ([place]) 01/2007   NON-HODGKINS-stage 3, cd 20 positive; status post 6 cycles of R-CHOP  . Dyspnea    with exertion  . GERD (gastroesophageal reflux disease)   . Herpes zoster without complication 08/05/5883  . History of hiatal hernia   . History of kidney stones   . HOH (hard of hearing)    wears hearing aides  . Non Hodgkin's lymphoma ([place])   . Personal history of chemotherapy   . PONV (postoperative nausea and vomiting)    in January lasted about 4 hours  . Recurrent UTI   . Squamous cell carcinoma of skin 11/09/2008   Right post. lat. elbow. SCCis arising in [place]. Excised 01/04/2009, margins free.    Past Surgical History:  Procedure Laterality Date  . ABDOMINAL SURGERY  2009   abdominal mass+ NH lymphoma,  . BACK SURGERY  01/2017   fusion. metal plate in neck at back  . CATARACT EXTRACTION  1997   right eye and left ey  . cervical neck fusion  1995  . CYSTOSCOPY W/  RETROGRADES Left 01/30/2018   Procedure: CYSTOSCOPY WITH RETROGRADE PYELOGRAM;  Surgeon: Billey Co, MD;  Location: [facility];  Service: Urology;  Laterality: Left;  . CYSTOSCOPY W/ RETROGRADES Left 08/05/2018   Procedure: CYSTOSCOPY WITH RETROGRADE PYELOGRAM;  Surgeon: Billey Co, MD;  Location: [facility];  Service: Urology;  Laterality: Left;  . CYSTOSCOPY W/ URETERAL STENT PLACEMENT Left 08/05/2018   Procedure: CYSTOSCOPY WITH STENT Exchange;  Surgeon: Billey Co, MD;  Location: [facility];  Service: Urology;  Laterality: Left;  . CYSTOSCOPY W/ URETERAL STENT PLACEMENT Left 05/21/2019   Procedure: CYSTOSCOPY WITH STENT REPLACEMENT;  Surgeon: Billey Co, MD;  Location: [facility];  Service: Urology;  Laterality: Left;  . CYSTOSCOPY WITH BIOPSY Left 02/27/2018   Procedure: CYSTOSCOPY WITH BIOPSY;  Surgeon: Billey Co, MD;  Location: [facility];  Service: Urology;  Laterality: Left;  . CYSTOSCOPY WITH STENT PLACEMENT Left 01/30/2018   Procedure: CYSTOSCOPY WITH STENT PLACEMENT;  Surgeon: Billey Co, MD;  Location: [facility];  Service: Urology;  Laterality: Left;  . CYSTOSCOPY WITH URETEROSCOPY AND STENT PLACEMENT Left 02/27/2018   Procedure: CYSTOSCOPY WITH URETEROSCOPY AND STENT Exchange;  Surgeon: Billey Co, MD;  Location: [facility];  Service: Urology;  Laterality: Left;  . DENTAL SURGERY     screws  . JOINT REPLACEMENT Left    knee  . KNEE ARTHROPLASTY Right 12/23/2018   Procedure: RIGHT COMPUTER ASSISTED TOTAL KNEE ARTHROPLASTY;  Surgeon: Dereck Leep, MD;  Location: [facility];  Service: Orthopedics;  Laterality: Right;  . KYPHOPLASTY N/A 02/21/2017   Procedure: [id number];  Surgeon: Hessie Knows, MD;  Location: [facility];  Service: Orthopedics;  Laterality: N/A;  . laparotomy with biopsy  03/02/2007  . PORTACATH PLACEMENT  2009  . [place]  . SQUAMOUS CELL CARCINOMA EXCISION     right arm  . TOTAL KNEE ARTHROPLASTY Left    2009  . URETEROSCOPY  Left 01/30/2018   Procedure: URETEROSCOPY;  Surgeon: Billey Co, MD;  Location: [facility];  Service: Urology;  Laterality: Left;  Marland Kitchen VAGINAL HYSTERECTOMY  1971   No current facility-administered medications for this encounter.  Current Outpatient Medications:  .  COMBIVENT RESPIMAT 20-100 MCG/ACT AERS respimat, INHALE 2 PUFFS INTO THE LUNGS 3 TIMES DAILY (Patient taking differently: Inhale 1 puff into the lungs daily. ), Disp: 4 g, Rfl: 5 .  folic acid (FOLVITE) 1 MG tablet, Take 1 tablet (1 mg total) by mouth daily., Disp: 30 tablet, Rfl: 2 .  vitamin B-12 1000 MCG tablet, Take 1 tablet (1,000 mcg total) by mouth daily., Disp: 30 tablet, Rfl: 2 .  [START ON 12/24/2019] Vitamin D, Ergocalciferol, (DRISDOL) 1.25 MG (50000 UNIT) CAPS capsule, Take 1 capsule (50,000 Units total) by mouth every 7 (seven) days., Disp: 5 capsule, Rfl: 0 .  acetaminophen (TYLENOL) 500 MG tablet, Take 1,000 mg by mouth every 6 (six) hours as needed for moderate pain. , Disp: , Rfl:    Family History  Problem Relation Age of Onset  . Breast cancer Sister   . Dementia Sister   . Cataracts Sister   . Heart attack Brother   . CAD Brother   . Heart attack Brother   . Leukemia Grandchild        granddaughter  . Kidney disease Neg Hx   . Bladder Cancer Neg Hx      Social History:  reports that she quit smoking about 30 years ago. Her smoking use included cigarettes. She smoked 0.25 packs per day. She has never used smokeless tobacco. She reports current alcohol use of about 14.0 standard drinks of alcohol per week. She reports that she does not use drugs.  Exam: Current vital signs: BP (!) 134/54   Pulse 91   Temp 98.3 F (36.8 C) (Oral)   Resp 15   Ht 5\' 2"  (1.575 m)   Wt 63.5 kg   SpO2 95%   BMI 25.61 kg/m  Vital signs in last 24 hours: Temp:  [98.3 F (36.8 C)] 98.3 F (36.8 C) (11/21 1018) Pulse Rate:  [83-91] 91 (11/21 1230) Resp:  [15-22] 15 (11/21 1230) BP: (106-134)/(54-103) 134/54 (11/21  1230) SpO2:  [93 %-100 %] 95 % (11/21 1230) Weight:  [63.5 kg] 63.5 kg (11/21 1015)   Physical Exam  Constitutional: Appears well-developed and well-nourished.  Psych: Affect appropriate to situation Eyes: No scleral injection HENT: No OP obstruction, good dentition MSK: no joint deformities.  Cardiovascular:  Perfusing extremities well Respiratory: Effort normal, non-labored breathing GI: Soft.  No distension. There is no tenderness.  Skin: WDI  Neuro: Mental Status: Patient is awake, alert, oriented to person, place, month, year, and situation  Patient is able to give some history with details corrected by daughter at beside (for example, couldn't name all family members at the table with her this morning, but named 3 out of 4 of them), was able to appropriately affirm / deny specific questions but gave limited history to open ended items  No clear aphasia (more recall effect) or neglect Could recall what her daughter had cooked for her yesterday by choices but not spontaneously Cranial Nerves: II: Visual Fields are full. Pupils are equal, round, and reactive to light.   III,IV, VI: EOMI without ptosis or diploplia.  V: Facial sensation is symmetric to temperature VII: Facial movement is symmetric.  VIII: hearing is intact to voice X: Uvula elevates symmetrically XI: Shoulder shrug is symmetric. XII: tongue is midline without atrophy or fasciculations.  Motor: Tone is normal. Bulk is normal. 5/5 strength was present in all four extremities.Mild RUE pronation w/o drift Sensory: Sensation is symmetric to light touch and temperature in the arms and legs. Deep Tendon Reflexes: 3+ and symmetric in the biceps and patellae.  Plantars: Toes are downgoing bilaterally.  Cerebellar: FNF and HKS are intact bilaterally   I have reviewed labs in epic and the results pertinent to this consultation are:  No results found for: HGBA1C  No results found for: CHOL, HDL, LDLCALC,  LDLDIRECT, TRIG, CHOLHDL   Lab Results  Component Value Date   TSH 3.561 12/16/2019   T4TOTAL 5.2 06/07/2016   Lab Results  Component Value Date   VITAMINB12 146 (L) 12/16/2019   Last vitamin D Lab Results  Component Value Date   VD25OH 15.57 (L) 12/16/2019   AKI (Cr 1.03 from baseline of 0.77) Mild hypokalemia to 3.4, stable  ECHO from last week as noted in HPI  Unresulted Labs (From admission, onward)          Start     Ordered   12/20/19 0500  CBC  Tomorrow morning,   R        12/19/19 1434   12/20/19 3329  Basic metabolic panel  Tomorrow morning,   R        12/19/19 1434   12/19/19 1357  Urine Drug Screen, Qualitative ([place] only)  Once,   STAT        12/19/19 1356   12/19/19 1039  Urinalysis, Complete w Microscopic  ONCE - STAT,   STAT        12/19/19 1038          I have reviewed the images obtained:   CT head 11/18 personally reviewed  Chronic microvascular changes periventricularly, ex-vacuo ventriculomegly   RUQ [place] 11/21 -- negative  Impression: This is a delightful 86 year old woman with a past medical history significant for diffuse large B cell lymphoma in remission, COPD, presenting with recurrent altered mental status episode.  At this point the differential is broad.  Would consider cardiac arrhythmia given her left atrial enlargement she is at risk of paroxysmal atrial fibrillation.  Additionally her valvular regurgitation could potentially lead to insufficient cardiac output; cardiology could be curbsided or formally consulted for this question. TIA less likely given her lack of vascular risk factors, however, posterior fossa TIA could present in this manner so will complete TIA workup. Seizure is also on the differential although  atypical, could be a focal seizure with altered awareness; she is at risk of lymphoma recurrence which can occur in the CNS thus will obtain contrasted MRI brain. Would not expect these acute episodes to be secondary to B12  deficiency but will supplement more aggressively in case this is playing a role in her general mild memory issues.  Recommendations:  # Transient alterations in mental status - MRI brain w/ and w/o   - MRA head w/o - HgbA1c, Lipid panel  - Carotid ultrasound - Routine EEG  - UA/Utox  - Neurology to follow  # B12 deficiency - B12 1000 mcg IM x 7 days, then resume oral supplementation    Lesleigh Noe MD-PhD [facility] [phone]

## 2019-12-19 NOTE — ED Triage Notes (Signed)
Pt arrived via [facility] from home. Per EMS, family called EMS because pt was "staring off into the distance" and started to get diaphoretic.   Per EMS, family states pt was admitted for this on Friday but was sent home after everything checked out.   PT A&Ox4 and NAD at this time

## 2019-12-20 ENCOUNTER — Telehealth: Payer: Self-pay | Admitting: Internal Medicine

## 2019-12-20 ENCOUNTER — Ambulatory Visit: Payer: Medicare Other | Admitting: Family Medicine

## 2019-12-20 DIAGNOSIS — R404 Transient alteration of awareness: Secondary | ICD-10-CM

## 2019-12-20 LAB — BASIC METABOLIC PANEL
Anion gap: 9 (ref 5–15)
BUN: 16 mg/dL (ref 8–23)
CO2: 25 mmol/L (ref 22–32)
Calcium: 8.9 mg/dL (ref 8.9–10.3)
Chloride: 99 mmol/L (ref 98–111)
Creatinine, Ser: 0.78 mg/dL (ref 0.44–1.00)
GFR, Estimated: >60 mL/min (ref >60)
Glucose, Bld: 107 mg/dL — ABNORMAL HIGH (ref 70–99)
Potassium: 3.9 mmol/L (ref 3.5–5.1)
Sodium: 133 mmol/L — ABNORMAL LOW (ref 135–145)

## 2019-12-20 LAB — CBC
HCT: 37.1 % (ref 36.0–46.0)
Hemoglobin: 12.2 g/dL (ref 12.0–15.0)
MCH: 30.8 pg (ref 26.0–34.0)
MCHC: 32.9 g/dL (ref 30.0–36.0)
MCV: 93.7 fL (ref 80.0–100.0)
Platelets: 303 10*3/uL (ref 150–400)
RBC: 3.96 MIL/uL (ref 3.87–5.11)
RDW: 13.1 % (ref 11.5–15.5)
WBC: 6 10*3/uL (ref 4.0–10.5)
nRBC: 0 % (ref 0.0–0.2)

## 2019-12-20 LAB — LIPID PANEL
Cholesterol: 175 mg/dL (ref 0–200)
HDL: 73 mg/dL (ref >40)
LDL Cholesterol: 91 mg/dL (ref 0–99)
Total CHOL/HDL Ratio: 2.4 RATIO
Triglycerides: 53 mg/dL (ref <150)
VLDL: 11 mg/dL (ref 0–40)

## 2019-12-20 LAB — HEMOGLOBIN A1C
Hgb A1c MFr Bld: 5.1 % (ref 4.8–5.6)
Mean Plasma Glucose: 99.67 mg/dL

## 2019-12-20 MED ORDER — ENOXAPARIN SODIUM 40 MG/0.4ML ~~LOC~~ SOLN
40.0000 mg | SUBCUTANEOUS | Status: DC
  Administered 2019-12-20: 21:00:00 40 mg via SUBCUTANEOUS
  Filled 2019-12-20: qty 0.4

## 2019-12-20 NOTE — Evaluation (Signed)
Physical Therapy Evaluation Patient Details Name: Monica Singh MRN: [id number] DOB: 04-05-1928 Today's Date: 12/20/2019   History of Present Illness  Monica Singh is an 89yoF who comes [facility] after eipsode of altered consciousness, staring into space, unresponsive. Pt was in ED several days prior after syncope at home laying on floor. Pt live alone at home, support from DTR.  Clinical Impression  Pt admitted with above diagnosis. Pt currently with functional limitations due to the deficits listed below (see "PT Problem List"). Upon entry, pt in bed, awake and agreeable to participate. The pt is alert and oriented x4, pleasant, conversational, and generally a good historian, however pt struggles with framing the context of dizziness and unsteadiness in session, often cannot safe with confidence what is Monica and what is chronic. Based on level of surprise and concern of complaints, author feel them likely to be Monica issues during session. Pt performed bed mobility with independence, transfers with supervision for safety, able to stand for 3 minutes for orthostatic vitals, but fatigues and asks to sit. Pt able to AMB around unit with RW per preference, but reports growing weakness of legs which limits going farther. Pt has some orthostatic drop from sitting to standing, which remains flat for remainder of session regardless of activity. Functional mobility assessment demonstrates increased effort/time requirements, poor tolerance, and need for physical assistance, whereas the patient performed these at a higher level of independence PTA. Pt will benefit from skilled PT intervention to increase independence and safety with basic mobility in preparation for discharge to the venue listed below.      12/20/19 1355  Orthostatic Lying   BP- Lying 123/65  Pulse- Lying 86  Orthostatic Sitting  BP- Sitting 130/59  Pulse- Sitting 92  Orthostatic Standing at 0 minutes  BP- Standing at 0 minutes  115/64  Pulse- Standing at 0 minutes 99  Orthostatic Standing at 3 minutes  BP- Standing at 3 minutes 118/60  Pulse- Standing at 3 minutes 101   Orthostatic Standing post ambulation (standing)  BP- Standing at 3 minutes 118/50  Pulse- Standing at 3 minutes 109       Follow Up Recommendations Home health PT;Supervision/Assistance - 24 hour;Supervision for mobility/OOB    Equipment Recommendations  None recommended by PT    Recommendations for Other Services       Precautions / Restrictions Precautions Precautions: Fall Restrictions Weight Bearing Restrictions: No      Mobility  Bed Mobility Overal bed mobility: Independent                  Transfers Overall transfer level: Needs assistance Equipment used: None Transfers: Sit to/from Stand Sit to Stand: Supervision            Ambulation/Gait Ambulation/Gait assistance: Supervision Gait Distance (Feet): 220 Feet Assistive device: Rolling walker (2 wheeled) Gait Pattern/deviations: WFL(Within Functional Limits) (minimal loading on RW; no obvious unsteadiness) Gait velocity: 0.25m/s   General Gait Details: declines AMB without device, despite baseline household AMB without device. Uses RW at superviison level, feels like legs are getting weaker the more she walks.  Stairs            Wheelchair Mobility    Modified Rankin (Stroke Patients Only)       Balance Overall balance assessment: History of Falls;No apparent balance deficits (not formally assessed);Modified Independent  Pertinent Vitals/Pain Pain Assessment: No/denies pain    Home Living Family/patient expects to be discharged to:: Private residence Living Arrangements: Alone Available Help at Discharge: Family Type of Home: House Home Access: Level entry     Home Layout: One level Home Equipment: Environmental consultant - 2 wheels;Shower seat - built in      Prior Function Level of  Independence: Needs assistance   Gait / Transfers Assistance Needed: mostly household AMB, reports no balance issues, but prefers +1 asssit for safety here in hospital;  ADL's / Homemaking Assistance Needed: Independent with ADL; DTR brings groceries in, Pt prepares meals, DTR helps with transport.        Hand Dominance   Dominant Hand: Right    Extremity/Trunk Assessment   Upper Extremity Assessment Upper Extremity Assessment: Overall WFL for tasks assessed    Lower Extremity Assessment Lower Extremity Assessment: Overall WFL for tasks assessed    Cervical / Trunk Assessment Cervical / Trunk Assessment: Normal  Communication      Cognition Arousal/Alertness: Awake/alert Behavior During Therapy: WFL for tasks assessed/performed Overall Cognitive Status: Within Functional Limits for tasks assessed                                        General Comments      Exercises     Assessment/Plan    PT Assessment Patient needs continued PT services  PT Problem List Decreased strength;Decreased activity tolerance;Decreased cognition;Decreased mobility;Decreased balance       PT Treatment Interventions Balance training;Gait training;Stair training;Functional mobility training;Therapeutic activities;Therapeutic exercise;DME instruction;Patient/family education    PT Goals (Current goals can be found in the Care Plan section)  Acute Rehab PT Goals Patient Stated Goal: stopping falling, improve tolerance to AMB PT Goal Formulation: With patient Time For Goal Achievement: 01/03/20 Potential to Achieve Goals: Fair    Frequency Min 2X/week   Barriers to discharge Decreased caregiver support livea lone with heavy support    Co-evaluation               AM-PAC PT "6 Clicks" Mobility  Outcome Measure Help needed turning from your back to your side while in a flat bed without using bedrails?: None Help needed moving from lying on your back to sitting on  the side of a flat bed without using bedrails?: None Help needed moving to and from a bed to a chair (including a wheelchair)?: A Little Help needed standing up from a chair using your arms (e.g., wheelchair or bedside chair)?: A Little Help needed to walk in hospital room?: A Little Help needed climbing 3-5 steps with a railing? : A Little 6 Click Score: 20    End of Session Equipment Utilized During Treatment: Gait belt Activity Tolerance: Patient limited by fatigue;Patient limited by lethargy Patient left: in bed;with call bell/phone within reach   PT Visit Diagnosis: Other abnormalities of gait and mobility (R26.89);Difficulty in walking, not elsewhere classified (R26.2);Dizziness and giddiness (R42)    Time: 1341-1415 PT Time Calculation (min) (ACUTE ONLY): 34 min   Charges:   PT Evaluation $PT Eval Low Complexity: 1 Low PT Treatments $Therapeutic Exercise: 8-22 mins        2:35 PM, 12/20/19 Etta Grandchild, PT, DPT Physical Therapist - [facility]  [phone] ([place])   Trinty Marken C 12/20/2019, 2:29 PM

## 2019-12-20 NOTE — Care Management Obs Status (Signed)
[place] NOTIFICATION   Patient Details  Name: Monica Singh MRN: [id number] Date of Birth: 1928-03-14   Medicare Observation Status Notification Given:  Yes    Jerold Yoss E Tasha Diaz, LCSW 12/20/2019, 12:42 PM

## 2019-12-20 NOTE — Progress Notes (Signed)
PROGRESS NOTE    [name]  WUJ:[id number] DOB: [birth year] DOA: 12/19/2019 PCP: Birdie Sons, MD  Brief Narrative:  86 y.o. female with medical history significant for recurrent/frequent UTI, history of left UPJ obstruction managed with indwelling urethral stent last exchanged by Dr. Jeb Levering on 05/21/2019, diffuse large cell lymphoma stage III, CD20 positive, status post 6 cycles of R-CHOP, recent hospitalization for mental status change and loss of consciousness staring up at the kitchen table.   She was brought by her daughter who presented to the emergency department for chief concerns of staring off into space, then slumping over at the breakfast table.  After she had return of consciousness, daughter reports patient had nausea and vomited her breakfast.  Daughter denies seizure activity, urinary or bowel incontinence  11/22: Patient's imaging survey including MRI, MRA, carotid duplex unrevealing for acute cause.  Neurology consulted from admission.  EEG pending at this time.  Daughter at bedside this morning.  Patient appears at baseline level of mentation and repeatedly asks if she can go home.   Assessment & Plan:   Principal Problem:   Staring episodes Active Problems:   Adnexal mass   H/O non-Hodgkin's lymphoma   Obstruction of left ureteropelvic junction (UPJ)   Syncope   B12 deficiency   Vitamin D deficiency   Frequent UTI  Transient altered mentation Neurology consulted Possible TIA but differential remains broad Difficult to exclude focal seizure MRI/MRA negative Chest abdomen pelvis CT also negative Carotid duplex reassuring U tox negative Plan: Routine EEG Follow-up any further neurology recommendations Therapy evaluations TOC consult  B12 deficiency Iron replacement 1023mcg daily x7 days Then resume oral supplementation  Vitamin D deficiency oral replacement  Recurrent UTI No evidence of urinary tract infection currently  Left  chronic UPJ obstruction status post indwelling chronic urethral stents placement outpatient follow-up Chart reviewed.   DVT prophylaxis: Lovenox Code Status: DNR Family Communication: Daughter at bedside Disposition Plan: Status is: Observation  The patient will require care spanning > 2 midnights and should be moved to inpatient because: Ongoing diagnostic testing needed not appropriate for outpatient work up and Inpatient level of care appropriate due to severity of illness  Dispo: The patient is from: Home              Anticipated d/c is to: Home              Anticipated d/c date is: 1 day              Patient currently is not medically stable to d/c.  Inpatient neurologic work-up including EEG.  Pending at this time.  Therapy evaluations today.  Possible discharge within 24 hours.       Consultants:   Neurology  Procedures:   None  Antimicrobials:   None   Subjective: Seen and examined.  Pleasant, no distress.  Alert and oriented.  Baseline level of mentation.  No pain complaints  Objective: Vitals:   12/19/19 2103 12/19/19 2341 12/20/19 0504 12/20/19 0948  BP: 118/60 108/64 118/69 (!) 109/51  Pulse: 85 81 80 91  Resp: 17 20 18 20   Temp: 98.1 F (36.7 C) 98.2 F (36.8 C) 98.2 F (36.8 C) 97.7 F (36.5 C)  TempSrc: Oral Oral Oral Oral  SpO2: 96% 98% 95% 98%  Weight:      Height:        Intake/Output Summary (Last 24 hours) at 12/20/2019 1108 Last data filed at 12/20/2019 1022 Gross per 24 hour  Intake  240 ml  Output --  Net 240 ml   Filed Weights   12/19/19 1015  Weight: 63.5 kg    Examination:  General exam: Appears calm and comfortable  Respiratory system: Clear to auscultation. Respiratory effort normal. Cardiovascular system: S1 & S2 heard, RRR. No JVD, murmurs, rubs, gallops or clicks. No pedal edema. Gastrointestinal system: Abdomen is nondistended, soft and nontender. No organomegaly or masses felt. Normal bowel sounds  heard. Central nervous system: Alert and oriented. No focal neurological deficits. Extremities: Symmetric 5 x 5 power. Skin: No rashes, lesions or ulcers Psychiatry: Judgement and insight appear normal. Mood & affect appropriate.     Data Reviewed: I have personally reviewed following labs and imaging studies  CBC: Recent Labs  Lab 12/16/19 1139 12/17/19 0423 12/19/19 1021 12/20/19 0340  WBC 8.2 7.0 6.5 6.0  NEUTROABS  --   --  4.1  --   HGB 13.3 11.2* 13.6 12.2  HCT 40.1 33.9* 41.4 37.1  MCV 94.4 93.6 95.4 93.7  PLT 348 294 335 938   Basic Metabolic Panel: Recent Labs  Lab 12/16/19 1139 12/17/19 0423 12/19/19 1021 12/20/19 0340  NA 137 135 135 133*  K 4.0 3.4* 3.4* 3.9  CL 100 102 96* 99  CO2 27 24 26 25   GLUCOSE 131* 99 156* 107*  BUN 19 15 14 16   CREATININE 0.98 0.77 1.03* 0.78  CALCIUM 8.7* 8.5* 9.2 8.9  MG  --   --  2.2  --    GFR: Estimated Creatinine Clearance: 41.8 mL/min (by C-G formula based on SCr of 0.78 mg/dL). Liver Function Tests: Recent Labs  Lab 12/19/19 1021  AST 20  ALT 13  ALKPHOS 52  BILITOT 0.8  PROT 7.2  ALBUMIN 4.1   No results for input(s): LIPASE, AMYLASE in the last 168 hours. No results for input(s): AMMONIA in the last 168 hours. Coagulation Profile: No results for input(s): INR, PROTIME in the last 168 hours. Cardiac Enzymes: Recent Labs  Lab 12/16/19 1139  CKTOTAL 45   BNP (last 3 results) No results for input(s): PROBNP in the last 8760 hours. HbA1C: Recent Labs    12/20/19 0340  HGBA1C 5.1   CBG: No results for input(s): GLUCAP in the last 168 hours. Lipid Profile: Recent Labs    12/20/19 0340  CHOL 175  HDL 73  LDLCALC 91  TRIG 53  CHOLHDL 2.4   Thyroid Function Tests: No results for input(s): TSH, T4TOTAL, FREET4, T3FREE, THYROIDAB in the last 72 hours. Anemia Panel: No results for input(s): VITAMINB12, FOLATE, FERRITIN, TIBC, IRON, RETICCTPCT in the last 72 hours. Sepsis Labs: No results for  input(s): PROCALCITON, LATICACIDVEN in the last 168 hours.  Recent Results (from the past 240 hour(s))  Resp Panel by RT-PCR (Flu A&B, Covid) Nasopharyngeal Swab     Status: None   Collection Time: 12/16/19  2:44 PM   Specimen: Nasopharyngeal Swab; Nasopharyngeal(NP) swabs in vial transport medium  Result Value Ref Range Status   SARS Coronavirus 2 by RT PCR NEGATIVE NEGATIVE Final    Comment: (NOTE) SARS-CoV-2 target nucleic acids are NOT DETECTED.  The SARS-CoV-2 RNA is generally detectable in upper respiratory specimens during the acute phase of infection. The lowest concentration of SARS-CoV-2 viral copies this assay can detect is 138 copies/mL. A negative result does not preclude SARS-Cov-2 infection and should not be used as the sole basis for treatment or other patient management decisions. A negative result may occur with  improper specimen collection/handling, submission of  specimen other than nasopharyngeal swab, presence of viral mutation(s) within the areas targeted by this assay, and inadequate number of viral copies(<138 copies/mL). A negative result must be combined with clinical observations, patient history, and epidemiological information. The expected result is Negative.  Fact Sheet for Patients:  EntrepreneurPulse.com.au  Fact Sheet for Healthcare Providers:  IncredibleEmployment.be  This test is no t yet approved or cleared by the [place] FDA and  has been authorized for detection and/or diagnosis of SARS-CoV-2 by FDA under an Emergency Use Authorization (EUA). This EUA will remain  in effect (meaning this test can be used) for the duration of the COVID-19 declaration under Section 564(b)(1) of the Act, 21 U.S.C.section 360bbb-3(b)(1), unless the authorization is terminated  or revoked sooner.       Influenza A by PCR NEGATIVE NEGATIVE Final   Influenza B by PCR NEGATIVE NEGATIVE Final    Comment: (NOTE) The  Xpert Xpress SARS-CoV-2/FLU/RSV plus assay is intended as an aid in the diagnosis of influenza from Nasopharyngeal swab specimens and should not be used as a sole basis for treatment. Nasal washings and aspirates are unacceptable for Xpert Xpress SARS-CoV-2/FLU/RSV testing.  Fact Sheet for Patients: EntrepreneurPulse.com.au  Fact Sheet for Healthcare Providers: IncredibleEmployment.be  This test is not yet approved or cleared by the [place] FDA and has been authorized for detection and/or diagnosis of SARS-CoV-2 by FDA under an Emergency Use Authorization (EUA). This EUA will remain in effect (meaning this test can be used) for the duration of the COVID-19 declaration under Section 564(b)(1) of the Act, 21 U.S.C. section 360bbb-3(b)(1), unless the authorization is terminated or revoked.  Performed at [facility], [address] [place], [place] [address]          Radiology Studies: MR ANGIO HEAD WO CONTRAST  Result Date: 12/19/2019 CLINICAL DATA:  Initial evaluation for acute delirium, confusional episode with subsequent loss of consciousness. History ofDLBCL in remission. EXAM: MRI HEAD WITHOUT AND WITH CONTRAST MRA HEAD WITHOUT CONTRAST TECHNIQUE: Multiplanar, multiecho pulse sequences of the brain and surrounding structures were obtained without and with intravenous contrast. Angiographic images of the head were obtained using MRA technique without contrast. CONTRAST:  7.77mL GADAVIST GADOBUTROL 1 MMOL/ML IV SOLN COMPARISON:  Prior CT from 12/16/2019 FINDINGS: MRI HEAD FINDINGS Brain: Generalized age appropriate cerebral atrophy. Patchy and confluent T2/FLAIR hyperintensity involving the periventricular deep white matter both cerebral hemispheres, most consistent with chronic small vessel ischemic disease, mild to moderate in nature. Patchy involvement of the pons and likely cerebellar vermis noted as well. No abnormal restricted  diffusion to suggest acute or subacute ischemia. Gray-white matter differentiation well maintained. No encephalomalacia to suggest chronic cortical infarction. No foci of susceptibility artifact to suggest acute or chronic intracranial hemorrhage. No mass lesion, midline shift or mass effect. No evidence for recurrent lymphoma within the CS. Mild ventricular prominence related to global parenchymal volume loss without hydrocephalus. No extra-axial fluid collection. Pituitary gland suprasellar region within normal limits. Midline structures intact. No abnormal enhancement. Vascular: Major intracranial vascular flow voids are well maintained. Skull and upper cervical spine: Craniocervical junction within normal limits. Bone marrow signal intensity normal. No scalp soft tissue abnormality. Sinuses/Orbits: Patient status post bilateral ocular lens replacement. Globes and orbital soft tissues demonstrate no acute finding. Paranasal sinuses are clear. No mastoid effusion. Inner ear structures within normal limits. Other: None. MRA HEAD FINDINGS ANTERIOR CIRCULATION: Examination mildly degraded by motion artifact. Visualized distal cervical segments of the internal carotid arteries are widely patent  with antegrade flow. Petrous, cavernous, and supraclinoid segments widely patent bilaterally. Dominant left A1 widely patent. Right A1 hypoplastic and/or absent, accounting for the diminutive right ICA is compared to the left. Azygos ACA is widely patent to its distal aspect. No M1 stenosis or occlusion. Normal MCA bifurcations. Distal MCA branches well perfused and symmetric. POSTERIOR CIRCULATION: Vertebral arteries widely patent to the vertebrobasilar junction without stenosis. Apparent fairly symmetric defects extending through the V4 segments on MIP reconstructions favored to be secondary to motion artifact through this region. Left vertebral artery dominant within apparent long segment fenestration. Both PICAs patent.  Basilar patent to its distal aspect without stenosis. Superior cerebral arteries patent bilaterally. Both PCAs primarily supplied via the basilar well perfused or distal aspects. No intracranial aneurysm or other vascular abnormality. IMPRESSION: MRI HEAD IMPRESSION: 1. No acute intracranial abnormality. 2. Age-related cerebral atrophy with mild to moderate chronic small vessel ischemic disease. 3. No evidence for recurrent lymphoma within the CNS. MRA HEAD IMPRESSION: 1. Negative intracranial MRA. No large vessel occlusion, hemodynamically significant stenosis, or other acute vascular abnormality. 2. Azygos ACA supplied primarily via the left carotid artery system. Electronically Signed   By: Jeannine Boga M.D.   On: 12/19/2019 23:05   MR BRAIN W WO CONTRAST  Result Date: 12/19/2019 CLINICAL DATA:  Initial evaluation for acute delirium, confusional episode with subsequent loss of consciousness. History ofDLBCL in remission. EXAM: MRI HEAD WITHOUT AND WITH CONTRAST MRA HEAD WITHOUT CONTRAST TECHNIQUE: Multiplanar, multiecho pulse sequences of the brain and surrounding structures were obtained without and with intravenous contrast. Angiographic images of the head were obtained using MRA technique without contrast. CONTRAST:  7.65mL GADAVIST GADOBUTROL 1 MMOL/ML IV SOLN COMPARISON:  Prior CT from 12/16/2019 FINDINGS: MRI HEAD FINDINGS Brain: Generalized age appropriate cerebral atrophy. Patchy and confluent T2/FLAIR hyperintensity involving the periventricular deep white matter both cerebral hemispheres, most consistent with chronic small vessel ischemic disease, mild to moderate in nature. Patchy involvement of the pons and likely cerebellar vermis noted as well. No abnormal restricted diffusion to suggest acute or subacute ischemia. Gray-white matter differentiation well maintained. No encephalomalacia to suggest chronic cortical infarction. No foci of susceptibility artifact to suggest acute or chronic  intracranial hemorrhage. No mass lesion, midline shift or mass effect. No evidence for recurrent lymphoma within the CS. Mild ventricular prominence related to global parenchymal volume loss without hydrocephalus. No extra-axial fluid collection. Pituitary gland suprasellar region within normal limits. Midline structures intact. No abnormal enhancement. Vascular: Major intracranial vascular flow voids are well maintained. Skull and upper cervical spine: Craniocervical junction within normal limits. Bone marrow signal intensity normal. No scalp soft tissue abnormality. Sinuses/Orbits: Patient status post bilateral ocular lens replacement. Globes and orbital soft tissues demonstrate no acute finding. Paranasal sinuses are clear. No mastoid effusion. Inner ear structures within normal limits. Other: None. MRA HEAD FINDINGS ANTERIOR CIRCULATION: Examination mildly degraded by motion artifact. Visualized distal cervical segments of the internal carotid arteries are widely patent with antegrade flow. Petrous, cavernous, and supraclinoid segments widely patent bilaterally. Dominant left A1 widely patent. Right A1 hypoplastic and/or absent, accounting for the diminutive right ICA is compared to the left. Azygos ACA is widely patent to its distal aspect. No M1 stenosis or occlusion. Normal MCA bifurcations. Distal MCA branches well perfused and symmetric. POSTERIOR CIRCULATION: Vertebral arteries widely patent to the vertebrobasilar junction without stenosis. Apparent fairly symmetric defects extending through the V4 segments on MIP reconstructions favored to be secondary to motion artifact through this region. Left vertebral  artery dominant within apparent long segment fenestration. Both PICAs patent. Basilar patent to its distal aspect without stenosis. Superior cerebral arteries patent bilaterally. Both PCAs primarily supplied via the basilar well perfused or distal aspects. No intracranial aneurysm or other vascular  abnormality. IMPRESSION: MRI HEAD IMPRESSION: 1. No acute intracranial abnormality. 2. Age-related cerebral atrophy with mild to moderate chronic small vessel ischemic disease. 3. No evidence for recurrent lymphoma within the CNS. MRA HEAD IMPRESSION: 1. Negative intracranial MRA. No large vessel occlusion, hemodynamically significant stenosis, or other acute vascular abnormality. 2. Azygos ACA supplied primarily via the left carotid artery system. Electronically Signed   By: Jeannine Boga M.D.   On: 12/19/2019 23:05   US Carotid Bilateral  Result Date: 12/19/2019 CLINICAL DATA:  Syncope. EXAM: BILATERAL CAROTID DUPLEX ULTRASOUND TECHNIQUE: Pearline Cables scale imaging, color Doppler and duplex ultrasound were performed of bilateral carotid and vertebral arteries in the neck. COMPARISON:  None. FINDINGS: Criteria: Quantification of carotid stenosis is based on velocity parameters that correlate the residual internal carotid diameter with NASCET-based stenosis levels, using the diameter of the distal internal carotid lumen as the denominator for stenosis measurement. The following velocity measurements were obtained: RIGHT ICA: 74/16 cm/sec CCA: 867/61 cm/sec SYSTOLIC ICA/CCA RATIO:  0.6 ECA: 50 cm/sec LEFT ICA: 99/21 cm/sec CCA: 95/09 cm/sec SYSTOLIC ICA/CCA RATIO:  1.2 ECA: 48 cm/sec RIGHT CAROTID ARTERY: Small amount of plaque in the right common carotid artery. External carotid artery is patent with normal waveform. Normal waveforms and velocities in the internal carotid artery. RIGHT VERTEBRAL ARTERY: Antegrade flow and normal waveform in the right vertebral artery. LEFT CAROTID ARTERY: Echogenic plaque in the distal common carotid artery and bulb. External carotid artery is patent with normal waveform. Echogenic plaque with shadowing in the proximal internal carotid artery. Normal waveforms and velocities in the internal carotid artery. LEFT VERTEBRAL ARTERY: Antegrade flow and normal waveform in the left  vertebral artery. IMPRESSION: 1. Atherosclerotic plaque involving the bilateral carotid arteries. Plaque is most prominent in the proximal left internal carotid artery. Estimated degree of stenosis in the internal carotid arteries is less than 50% bilaterally. 2. Patent vertebral arteries with antegrade flow. Electronically Signed   By: Markus Daft M.D.   On: 12/19/2019 17:47   US Abdomen Limited RUQ (LIVER/GB)  Result Date: 12/19/2019 CLINICAL DATA:  Right upper quadrant pain. EXAM: ULTRASOUND ABDOMEN LIMITED RIGHT UPPER QUADRANT COMPARISON:  None. FINDINGS: Gallbladder: No gallstones or wall thickening visualized. No sonographic Murphy sign noted by sonographer. Common bile duct: Diameter: 1.2 mm. Liver: No focal lesion identified. Within normal limits in parenchymal echogenicity. Portal vein is patent on color Doppler imaging with normal direction of blood flow towards the liver. Other: None. IMPRESSION: No acute findings. Electronically Signed   By: Marin Olp M.D.   On: 12/19/2019 13:39        Scheduled Meds: . cyanocobalamin  1,000 mcg Intramuscular Q1200  . enoxaparin (LOVENOX) injection  40 mg Subcutaneous Q24H  . folic acid  1 mg Oral Daily  . Ipratropium-Albuterol  1 puff Inhalation Daily  . Vitamin D (Ergocalciferol)  50,000 Units Oral Q7 days   Continuous Infusions:   LOS: 0 days    Time spent: 25 minutes    Sidney Ace, MD [facility] Pager [phone]  If 7PM-7AM, please contact night-coverage 12/20/2019, 11:08 AM

## 2019-12-20 NOTE — Procedures (Signed)
Patient Name: Monica Singh  MRN: [id number]  Epilepsy Attending: Lora Havens  Referring Physician/Provider: Dr Lesleigh Noe Date: 12/20/2019  Duration: 22.36 mins  Patient history: 86 year old woman with a past medical history significant for diffuse large B cell lymphoma in remission, COPD, presenting with recurrent altered mental status episode. EEG to evaluate for seizure.  Level of alertness: Awake  AEDs during EEG study: None  Technical aspects: This EEG study was done with scalp electrodes positioned according to the 10-20 International system of electrode placement. Electrical activity was acquired at a sampling rate of 500Hz  and reviewed with a high frequency filter of 70Hz  and a low frequency filter of 1Hz . EEG data were recorded continuously and digitally stored.   Description: The posterior dominant rhythm consists of 8 Hz activity of moderate voltage (25-35 uV) seen predominantly in posterior head regions, symmetric and reactive to eye opening and eye closing. Physiologic photic driving was not seen during photic stimulation.  Hyperventilation was not performed.     IMPRESSION: This study is within normal limits. No seizures or definite epileptiform discharges were seen throughout the recording.  Michaelene Dutan Barbra Sarks

## 2019-12-20 NOTE — Progress Notes (Signed)
eeg done °

## 2019-12-20 NOTE — Progress Notes (Signed)
PHARMACIST - PHYSICIAN COMMUNICATION  CONCERNING:  Enoxaparin (Lovenox) for DVT Prophylaxis    RECOMMENDATION: Patient was prescribed enoxaprin 30mg  q24 hours for VTE prophylaxis.   Filed Weights   12/19/19 1015  Weight: 63.5 kg (140 lb)    Body mass index is 25.61 kg/m.  Estimated Creatinine Clearance: 41.8 mL/min (by C-G formula based on SCr of 0.78 mg/dL).   Patient is candidate for enoxaparin 40mg  every 24 hours based on CrCl >30ml/min AND Weight >45kg  DESCRIPTION: Pharmacy has adjusted enoxaparin dose per [facility] policy.  Patient is now receiving enoxaparin 40 mg every 24 hours    Lu Duffel, PharmD Clinical Pharmacist  12/20/2019 8:56 AM

## 2019-12-20 NOTE — Evaluation (Signed)
Occupational Therapy Evaluation Patient Details Name: Monica Singh MRN: [id number] DOB: 1928-07-22 Today's Date: 12/20/2019    History of Present [place] is an 28yoF who comes to [facility] after eipsode of altered consciousness, staring into space, unresponsive. Pt was in ED several days prior after syncope at home laying on floor. Pt live alone at home, support from DTR.   Clinical Impression   Patient presenting with decreased I in self care, balance, functional mobility/transfers, endurance, and safety awareness.  Patient reports being independent at living in a level entry condo PTA. Pt does not have any steps to manage within her home. Pt with no c/o dizziness this session.  Patient currently functioning at supervision progressing to mod I with self care tasks and functional mobility without use of AD. OT recommended pt have daughter stay with her at discharge for safety but no follow up OT services needed at discharge. Patient will benefit from acute OT to increase overall independence in the areas of ADLs, functional mobility, and safety awareness in order to safely discharge home with caregiver.    Follow Up Recommendations  No OT follow up;Supervision/Assistance - 24 hour    Equipment Recommendations  None recommended by OT    Recommendations for Other Services Other (comment) (none at this time)     Precautions / Restrictions Precautions Precautions: Fall Restrictions Weight Bearing Restrictions: No      Mobility Bed Mobility Overal bed mobility: Independent      Transfers Overall transfer level: Needs assistance Equipment used: None Transfers: Sit to/from Stand;Stand Pivot Transfers Sit to Stand: Modified independent (Device/Increase time) Stand pivot transfers: Supervision            Balance Overall balance assessment: History of Falls;No apparent balance deficits (not formally assessed);Modified Independent         ADL either  performed or assessed with clinical judgement   ADL Overall ADL's : Needs assistance/impaired Eating/Feeding: Independent   Grooming: Wash/dry hands;Wash/dry face;Oral care;Supervision/safety;Standing               Lower Body Dressing: Supervision/safety;Sit to/from stand   Toilet Transfer: Supervision/safety   Toileting- Water quality scientist and Hygiene: Supervision/safety         General ADL Comments: supervision for toileting needs and standing at sink for grooming tasks without use of AD. No LOB noted. Pt ambulating in hallway with S progressing to mod I before returning to room.     Vision Baseline Vision/History: Wears glasses Wears Glasses: At all times Patient Visual Report: No change from baseline Vision Assessment?: No apparent visual deficits            Pertinent Vitals/Pain Pain Assessment: No/denies pain     Hand Dominance Right   Extremity/Trunk Assessment Upper Extremity Assessment Upper Extremity Assessment: Overall WFL for tasks assessed   Lower Extremity Assessment Lower Extremity Assessment: Overall WFL for tasks assessed   Cervical / Trunk Assessment Cervical / Trunk Assessment: Normal   Communication Communication Communication: No difficulties   Cognition Arousal/Alertness: Awake/alert Behavior During Therapy: WFL for tasks assessed/performed Overall Cognitive Status: Within Functional Limits for tasks assessed                    Home Living Family/patient expects to be discharged to:: Private residence Living Arrangements: Alone Available Help at Discharge: Family Type of Home: House Home Access: Level entry     Home Layout: One level     Bathroom Shower/Tub: Occupational psychologist: Associate Professor  Accessibility: Yes   Home Equipment: Walker - 2 wheels;Shower seat - built in;Grab bars - tub/shower;Grab bars - toilet          Prior Functioning/Environment Level of Independence: Needs assistance   Gait / Transfers Assistance Needed: mostly household AMB, reports no balance issues, but prefers +1 asssit for safety here in hospital; ADL's / Homemaking Assistance Needed: Independent with ADL; DTR brings groceries in, Pt prepares meals, DTR helps with transport. Communication / Swallowing Assistance Needed: stopped dirivng locally due to concerns over increasing forgetfulness, but drives when at [employer].          OT Problem List: Decreased strength;Decreased activity tolerance;Decreased safety awareness;Impaired balance (sitting and/or standing);Decreased knowledge of use of DME or AE      OT Treatment/Interventions: Self-care/ADL training;Therapeutic exercise;Therapeutic activities;Energy conservation;Patient/family education;DME and/or AE instruction;Balance training    OT Goals(Current goals can be found in the care plan section) Acute Rehab OT Goals Patient Stated Goal: to return home OT Goal Formulation: With patient Time For Goal Achievement: 01/03/20 Potential to Achieve Goals: Good ADL Goals Pt Will Perform Lower Body Dressing: with modified independence;sit to/from stand Pt Will Transfer to Toilet: with modified independence;ambulating Pt Will Perform Toileting - Clothing Manipulation and hygiene: with modified independence;sit to/from stand Pt Will Perform Tub/Shower Transfer: Shower transfer;with modified independence;shower seat;ambulating  OT Frequency: Min 1X/week   Barriers to D/C: Other (comment)  none known at this time          AM-PAC OT "6 Clicks" Daily Activity     Outcome Measure Help from another person eating meals?: None Help from another person taking care of personal grooming?: None Help from another person toileting, which includes using toliet, bedpan, or urinal?: A Little Help from another person bathing (including washing, rinsing, drying)?: A Little Help from another person to put on and taking off regular upper body clothing?: None Help  from another person to put on and taking off regular lower body clothing?: A Little 6 Click Score: 21   End of Session Nurse Communication: Mobility status  Activity Tolerance: Patient tolerated treatment well Patient left: in bed;with call bell/phone within reach;Other (comment) (transport arrived to take for testing at end of session)  OT Visit Diagnosis: Muscle weakness (generalized) (M62.81);History of falling (Z91.81)                Time: 3888-2800 OT Time Calculation (min): 17 min Charges:  OT General Charges $OT Visit: 1 Visit OT Evaluation $OT Eval Low Complexity: 1 Low OT Treatments $Self Care/Home Management : 8-22 mins  Darleen Crocker, MS, OTR/L , CBIS ascom [phone]  12/20/19, 3:19 PM

## 2019-12-20 NOTE — Telephone Encounter (Signed)
Patient daughter calling per directions of hospitalist.  Patient admitted currently for syncope.  Dr. Tobie Poet asked daughter to call and arrange a new patient visit and stated patient needed a zio asap.  Scheduled with End 11/24 at 0920

## 2019-12-20 NOTE — Progress Notes (Signed)
Neurology Progress note  - Pt is close to baseline as per pt and daughter at bedside - imaging reviewed and no acute abnormalities   Past Medical History:  Diagnosis Date  . Actinic keratosis   . Adnexal mass 06/03/2014   since 2009  . Arthritis   . Complication of anesthesia   . COPD (chronic obstructive pulmonary disease) ([place])   . Diffuse large cell lymphoma in remission ([place]) 01/2007   NON-HODGKINS-stage 3, cd 20 positive; status post 6 cycles of R-CHOP  . Dyspnea    with exertion  . GERD (gastroesophageal reflux disease)   . Herpes zoster without complication 07/28/2456  . History of hiatal hernia   . History of kidney stones   . HOH (hard of hearing)    wears hearing aides  . Non Hodgkin's lymphoma ([place])   . Personal history of chemotherapy   . PONV (postoperative nausea and vomiting)    in January lasted about 4 hours  . Recurrent UTI   . Squamous cell carcinoma of skin 11/09/2008   Right post. lat. elbow. SCCis arising in AK. Excised 01/04/2009, margins free.    Past Surgical History:  Procedure Laterality Date  . ABDOMINAL SURGERY  2009   abdominal mass+ NH lymphoma,  . BACK SURGERY  01/2017   fusion. metal plate in neck at back  . CATARACT EXTRACTION  1997   right eye and left ey  . cervical neck fusion  1995  . CYSTOSCOPY W/ RETROGRADES Left 01/30/2018   Procedure: CYSTOSCOPY WITH RETROGRADE PYELOGRAM;  Surgeon: Billey Co, MD;  Location: [facility] ORS;  Service: Urology;  Laterality: Left;  . CYSTOSCOPY W/ RETROGRADES Left 08/05/2018   Procedure: CYSTOSCOPY WITH RETROGRADE PYELOGRAM;  Surgeon: Billey Co, MD;  Location: [facility];  Service: Urology;  Laterality: Left;  . CYSTOSCOPY W/ URETERAL STENT PLACEMENT Left 08/05/2018   Procedure: CYSTOSCOPY WITH STENT Exchange;  Surgeon: Billey Co, MD;  Location: [facility];  Service: Urology;  Laterality: Left;  . CYSTOSCOPY W/ URETERAL STENT PLACEMENT Left 05/21/2019   Procedure: CYSTOSCOPY WITH STENT REPLACEMENT;   Surgeon: Billey Co, MD;  Location: [facility];  Service: Urology;  Laterality: Left;  . CYSTOSCOPY WITH BIOPSY Left 02/27/2018   Procedure: CYSTOSCOPY WITH BIOPSY;  Surgeon: Billey Co, MD;  Location: [facility];  Service: Urology;  Laterality: Left;  . CYSTOSCOPY WITH STENT PLACEMENT Left 01/30/2018   Procedure: CYSTOSCOPY WITH STENT PLACEMENT;  Surgeon: Billey Co, MD;  Location: [facility];  Service: Urology;  Laterality: Left;  . CYSTOSCOPY WITH URETEROSCOPY AND STENT PLACEMENT Left 02/27/2018   Procedure: CYSTOSCOPY WITH URETEROSCOPY AND STENT Exchange;  Surgeon: Billey Co, MD;  Location: [facility];  Service: Urology;  Laterality: Left;  . DENTAL SURGERY     screws  . JOINT REPLACEMENT Left    knee  . KNEE ARTHROPLASTY Right 12/23/2018   Procedure: RIGHT COMPUTER ASSISTED TOTAL KNEE ARTHROPLASTY;  Surgeon: Dereck Leep, MD;  Location: [facility];  Service: Orthopedics;  Laterality: Right;  . KYPHOPLASTY N/A 02/21/2017   Procedure: [id number];  Surgeon: Hessie Knows, MD;  Location: [facility];  Service: Orthopedics;  Laterality: N/A;  . laparotomy with biopsy  03/02/2007  . PORTACATH PLACEMENT  2009  . [place]  . SQUAMOUS CELL CARCINOMA EXCISION     right arm  . TOTAL KNEE ARTHROPLASTY Left    2009  . URETEROSCOPY Left 01/30/2018   Procedure: URETEROSCOPY;  Surgeon: Billey Co, MD;  Location: [facility];  Service: Urology;  Laterality: Left;  Marland Kitchen VAGINAL HYSTERECTOMY  1971    Current Facility-Administered Medications:  .  acetaminophen (TYLENOL) tablet 650 mg, 650 mg, Oral, Q6H PRN **OR** acetaminophen (TYLENOL) suppository 650 mg, 650 mg, Rectal, Q6H PRN, Cox, Amy N, DO .  cyanocobalamin ((VITAMIN B-12)) injection 1,000 mcg, 1,000 mcg, Intramuscular, Q1200, Cox, Amy N, DO, 1,000 mcg at 12/19/19 2105 .  enoxaparin (LOVENOX) injection 40 mg, 40 mg, Subcutaneous, Q24H, Shanlever, Pierce Crane, RPH .  folic acid (FOLVITE) tablet 1 mg, 1 mg, Oral, Daily, Cox,  Amy N, DO, 1 mg at 12/20/19 0903 .  Ipratropium-Albuterol (COMBIVENT) respimat 1 puff, 1 puff, Inhalation, Daily, Cox, Amy N, DO, 1 puff at 12/20/19 0903 .  ondansetron (ZOFRAN) tablet 4 mg, 4 mg, Oral, Q6H PRN **OR** ondansetron (ZOFRAN) injection 4 mg, 4 mg, Intravenous, Q6H PRN, Cox, Amy N, DO .  Vitamin D (Ergocalciferol) (DRISDOL) capsule 50,000 Units, 50,000 Units, Oral, Q7 days, Cox, Amy N, DO, 50,000 Units at 12/20/19 0903   Family History  Problem Relation Age of Onset  . Breast cancer Sister   . Dementia Sister   . Cataracts Sister   . Heart attack Brother   . CAD Brother   . Heart attack Brother   . Leukemia Grandchild        granddaughter  . Kidney disease Neg Hx   . Bladder Cancer Neg Hx      Social History:  reports that she quit smoking about 30 years ago. Her smoking use included cigarettes. She smoked 0.25 packs per day. She has never used smokeless tobacco. She reports current alcohol use of about 14.0 standard drinks of alcohol per week. She reports that she does not use drugs.  Exam: Current vital signs: BP (!) 109/51 (BP Location: Right Arm)   Pulse 91   Temp 97.7 F (36.5 C) (Oral)   Resp 20   Ht 5\' 2"  (1.575 m)   Wt 63.5 kg   SpO2 98%   BMI 25.61 kg/m  Vital signs in last 24 hours: Temp:  [97.7 F (36.5 C)-98.5 F (36.9 C)] 97.7 F (36.5 C) (11/22 0948) Pulse Rate:  [80-91] 91 (11/22 0948) Resp:  [15-21] 20 (11/22 0948) BP: (106-134)/(51-78) 109/51 (11/22 0948) SpO2:  [93 %-100 %] 98 % (11/22 0948)   Physical Exam  Constitutional: Appears well-developed and well-nourished.  Psych: Affect appropriate to situation Eyes: No scleral injection HENT: No OP obstruction, good dentition MSK: no joint deformities.  Cardiovascular: Perfusing extremities well Respiratory: Effort normal, non-labored breathing GI: Soft.  No distension. There is no tenderness.  Skin: WDI  Neuro: Mental Status: Patient is awake, alert, oriented to person, place,  month, year, and situation  Patient is able to give some history with details corrected by daughter at beside (for example, couldn't name all family members at the table with her this morning, but named 3 out of 4 of them), was able to appropriately affirm / deny specific questions but gave limited history to open ended items  No clear aphasia (more recall effect) or neglect Could recall what her daughter had cooked for her yesterday by choices but not spontaneously Cranial Nerves: II: Visual Fields are full. Pupils are equal, round, and reactive to light.   III,IV, VI: EOMI without ptosis or diploplia.  V: Facial sensation is symmetric to temperature VII: Facial movement is symmetric.  VIII: hearing is intact to voice X: Uvula elevates symmetrically XI: Shoulder shrug is symmetric. XII:  tongue is midline without atrophy or fasciculations.  Motor: Tone is normal. Bulk is normal. 5/5 strength was present in all four extremities.Mild RUE pronation w/o drift Sensory: Sensation is symmetric to light touch and temperature in the arms and legs. Deep Tendon Reflexes: 3+ and symmetric in the biceps and patellae.  Plantars: Toes are downgoing bilaterally.  Cerebellar: FNF and HKS are intact bilaterally   I have reviewed labs in epic and the results pertinent to this consultation are:  Lab Results  Component Value Date   HGBA1C 5.1 12/20/2019    Lab Results  Component Value Date   CHOL 175 12/20/2019   HDL 73 12/20/2019   LDLCALC 91 12/20/2019   TRIG 53 12/20/2019   CHOLHDL 2.4 12/20/2019     Lab Results  Component Value Date   TSH 3.561 12/16/2019   T4TOTAL 5.2 06/07/2016   Lab Results  Component Value Date   VITAMINB12 146 (L) 12/16/2019   Last vitamin D Lab Results  Component Value Date   VD25OH 15.57 (L) 12/16/2019   AKI (Cr 1.03 from baseline of 0.77) Mild hypokalemia to 3.4, stable  ECHO from last week as noted in HPI  Unresulted Labs (From admission, onward)           Start     Ordered   12/19/19 1357  Urine Drug Screen, Qualitative ([place] only)  Once,   STAT        12/19/19 1356   12/19/19 1039  Urinalysis, Complete w Microscopic  ONCE - STAT,   STAT        12/19/19 1038          I have reviewed the images obtained:   CT head 11/18 personally reviewed  Chronic microvascular changes periventricularly, ex-vacuo ventriculomegly   RUQ [place] 11/21 -- negative  MRI head/ MRA normal studies   Impression: This is a delightful 86 year old woman with a past medical history significant for diffuse large B cell lymphoma in remission, COPD, presenting with recurrent altered mental status episode.  Recommendations:  # Transient alterations in mental status - Improved and back to baseline  - Imaging is normal - EEG later today and likely d/c planning from Neurological standpoint  - B12 oral supplementation for total 7 days

## 2019-12-20 NOTE — Progress Notes (Signed)
Pt and daughter notified on negative EEG results per MD, S. Sreenath, request.

## 2019-12-21 DIAGNOSIS — R404 Transient alteration of awareness: Secondary | ICD-10-CM | POA: Diagnosis not present

## 2019-12-21 NOTE — TOC Initial Note (Signed)
Transition of Care [facility]) - Initial/Assessment Note    Patient Details  Name: Monica Singh MRN: [id number] Date of Birth: [birth year]  [facility] CM/SW Contact:    Magnus Ivan, LCSW Phone Number: 12/21/2019, 9:01 AM  Clinical Narrative:       CSW spoke to patient and daughter Monica Singh. Patient lives alone. Monica Singh provides transportation. Confirmed home address. PCP is Dr. Caryn Section. Pharmacy is [facility]. Patient has a RW. Explained PT recommending HHPT. Spoke to Dr. Priscella Mann who would like RN, PT, OT, Aide services if possible. Patient and daughter agreeable to [facility] services. Denied an agency preference. CSW made referral to [facility] with [facility].            Expected Discharge Plan: [place] Barriers to Discharge: Barriers Resolved   Patient Goals and CMS Choice Patient states their goals for this hospitalization and ongoing recovery are:: home with home health CMS Medicare.gov Compare Post Acute Care list provided to:: Patient Choice offered to / list presented to : Patient  Expected Discharge Plan and Services Expected Discharge Plan: [place]       Living arrangements for the past 2 months: Apartment                           HH Arranged: RN, PT, OT, Nurse's Aide [place] Agency: [facility] Date [facility] Agency Contacted: 12/21/19   Representative spoke with at [place]: Monica Singh  Prior Living Arrangements/Services Living arrangements for the past 2 months: Apartment Lives with:: Self Patient language and need for interpreter reviewed:: Yes Do you feel safe going back to the place where you live?: Yes      Need for Family Participation in Patient Care: Yes (Comment) Care giver support system in place?: Yes (comment) Current home services: DME Criminal Activity/Legal Involvement Pertinent to Current Situation/Hospitalization: No - Comment as needed  Activities of Daily Living Home  Assistive Devices/Equipment: None ADL Screening (condition at time of admission) Patient's cognitive ability adequate to safely complete daily activities?: Yes Is the patient deaf or have difficulty hearing?: Yes Does the patient have difficulty seeing, even when wearing glasses/contacts?: No Does the patient have difficulty concentrating, remembering, or making decisions?: No Patient able to express need for assistance with ADLs?: Yes Does the patient have difficulty dressing or bathing?: No Independently performs ADLs?: Yes (appropriate for developmental age) Does the patient have difficulty walking or climbing stairs?: Yes Weakness of Legs: Both Weakness of Arms/Hands: None  Permission Sought/Granted Permission sought to share information with : Facility Sport and exercise psychologist, Family Supports Permission granted to share information with : Yes, Verbal Permission Granted     Permission granted to share info w AGENCY: [place] agencies  Permission granted to share info w Relationship: daughters     Emotional Assessment Appearance:: Appears stated age     Orientation: : Oriented to Situation, Oriented to  Time, Oriented to Place, Oriented to Self Alcohol / Substance Use: Not Applicable Psych Involvement: No (comment)  Admission diagnosis:  Right upper quadrant abdominal pain [R10.11] Staring episodes [R40.4] Patient Active Problem List   Diagnosis Date Noted  . Staring episodes 12/19/2019  . B12 deficiency 12/19/2019  . Vitamin D deficiency 12/19/2019  . Frequent UTI 12/19/2019  . Syncope 12/16/2019  . Total knee replacement status 12/23/2018  . Obstruction of left ureteropelvic junction (UPJ) 02/26/2018  . Sepsis ([place]) 02/10/2018  . Closed burst fracture  of lumbar vertebra with routine healing 04/01/2017  . Chronic bronchitis ([place]) 02/18/2017  . Osteoporosis 11/20/2015  . COPD (chronic obstructive pulmonary disease) ([place]) 11/20/2015  . Cyst of ovary 07/26/2015  . Status post  total left knee replacement 04/03/2015  . Abnormal chest sounds 08/25/2014  . Allergic rhinitis 08/25/2014  . Colon polyp 08/25/2014  . DDD (degenerative disc disease), cervical 08/25/2014  . DDD (degenerative disc disease), lumbar 08/25/2014  . H/O non-Hodgkin's lymphoma 08/25/2014  . Below normal amount of sodium in the blood 08/25/2014  . Primary osteoarthritis of right knee 08/25/2014  . Subclinical hypothyroidism 08/25/2014  . Adnexal mass 06/03/2014  . Osteoarthritis 03/19/2011  . Lymphoma of small bowel ([place]) 03/11/2008  . History of smoking 03/11/2008   PCP:  Birdie Sons, MD Pharmacy:   [place], [place] - [address] [facility] [place] [address] Phone: [phone] Fax: [phone]  [place], [place] - [address] [place] [address] Phone: [phone] Fax: [phone]     Social Determinants of Health (SDOH) Interventions    Readmission Risk Interventions No flowsheet data found.

## 2019-12-21 NOTE — Progress Notes (Signed)
Office Visit    Patient Name: [name] Date of Encounter: 12/22/2019  Primary Care Provider:  Birdie Sons, MD Primary Cardiologist: Dr. Saunders Revel  Chief Complaint    Chief Complaint  Patient presents with  . New Patient (Initial Visit)    Ref by Dr. Tobie Poet from [facility] ER; syncope. Meds reviewed by the pt. verbally. Pt. c/o having more syncopal spells since the ER visit.     86 year old female with history of recurrent/frequent UTI, left UPJ obstruction managed with indwelling urethral stent, diffuse large cell lymphoma stage III, CD20 positive, s/p 6 cycles of R-CHOP, recent hospitalization 2/2 AMS and loss of consciousness without cardiology consultation, and who presents for new patient visit appointment for further work-up of syncopal episode.  Past Medical History    Past Medical History:  Diagnosis Date  . Actinic keratosis   . Adnexal mass 06/03/2014   since 2009  . Arthritis   . Complication of anesthesia   . COPD (chronic obstructive pulmonary disease) ([place])   . Diffuse large cell lymphoma in remission ([place]) 01/2007   NON-HODGKINS-stage 3, cd 20 positive; status post 6 cycles of R-CHOP  . Dyspnea    with exertion  . GERD (gastroesophageal reflux disease)   . Herpes zoster without complication 07/03/4401  . History of hiatal hernia   . History of kidney stones   . HOH (hard of hearing)    wears hearing aides  . Non Hodgkin's lymphoma ([place])   . Personal history of chemotherapy   . PONV (postoperative nausea and vomiting)    in January lasted about 4 hours  . Recurrent UTI   . Squamous cell carcinoma of skin 11/09/2008   Right post. lat. elbow. SCCis arising in [place]. Excised 01/04/2009, margins free.    Past Surgical History:  Procedure Laterality Date  . ABDOMINAL SURGERY  2009   abdominal mass+ NH lymphoma,  . BACK SURGERY  01/2017   fusion. metal plate in neck at back  . CATARACT EXTRACTION  1997   right eye and left ey  . cervical neck fusion  1995    . CYSTOSCOPY W/ RETROGRADES Left 01/30/2018   Procedure: CYSTOSCOPY WITH RETROGRADE PYELOGRAM;  Surgeon: Billey Co, MD;  Location: [facility] ORS;  Service: Urology;  Laterality: Left;  . CYSTOSCOPY W/ RETROGRADES Left 08/05/2018   Procedure: CYSTOSCOPY WITH RETROGRADE PYELOGRAM;  Surgeon: Billey Co, MD;  Location: [facility] OR[facility];  Service: Urology;  Laterality: Left;  . CYSTOSCOPY W/ URETERAL STENT PLACEMENT Left 08/05/2018   Procedure: CYSTOSCOPY WITH STENT Exchange;  Surgeon: Billey Co, MD;  Location: [facility];  Service: Urology;  Laterality: Left;  . CYSTOSCOPY W/ URETERAL STENT PLACEMENT Left 05/21/2019   Procedure: CYSTOSCOPY WITH STENT REPLACEMENT;  Surgeon: Billey Co, MD;  Location: [facility];  Service: Urology;  Laterality: Left;  . CYSTOSCOPY WITH BIOPSY Left 02/27/2018   Procedure: CYSTOSCOPY WITH BIOPSY;  Surgeon: Billey Co, MD;  Location: [facility];  Service: Urology;  Laterality: Left;  . CYSTOSCOPY WITH STENT PLACEMENT Left 01/30/2018   Procedure: CYSTOSCOPY WITH STENT PLACEMENT;  Surgeon: Billey Co, MD;  Location: [facility];  Service: Urology;  Laterality: Left;  . CYSTOSCOPY WITH URETEROSCOPY AND STENT PLACEMENT Left 02/27/2018   Procedure: CYSTOSCOPY WITH URETEROSCOPY AND STENT Exchange;  Surgeon: Billey Co, MD;  Location: [facility];  Service: Urology;  Laterality: Left;  . DENTAL SURGERY     screws  . JOINT REPLACEMENT Left  knee  . KNEE ARTHROPLASTY Right 12/23/2018   Procedure: RIGHT COMPUTER ASSISTED TOTAL KNEE ARTHROPLASTY;  Surgeon: Dereck Leep, MD;  Location: [facility];  Service: Orthopedics;  Laterality: Right;  . KYPHOPLASTY N/A 02/21/2017   Procedure: [id number];  Surgeon: Hessie Knows, MD;  Location: [facility];  Service: Orthopedics;  Laterality: N/A;  . laparotomy with biopsy  03/02/2007  . PORTACATH PLACEMENT  2009  . [place]  . SQUAMOUS CELL CARCINOMA EXCISION     right arm  . TOTAL KNEE ARTHROPLASTY Left    2009   . URETEROSCOPY Left 01/30/2018   Procedure: URETEROSCOPY;  Surgeon: Billey Co, MD;  Location: [facility];  Service: Urology;  Laterality: Left;  Marland Kitchen VAGINAL HYSTERECTOMY  1971    Allergies  Allergies  Allergen Reactions  . Milk-Related Compounds Diarrhea and Nausea And Vomiting    Gas too  . Lac Bovis Diarrhea and Nausea And Vomiting    Milk allergy   . Sulfa Antibiotics Hives and Itching    History of Present Illness    [place] is a 86 y.o. female with PMH as above. She has a history of diffuse large cell lymphoma and frequent urinary tract infections with left UPJ obstruction managed with indwelling urethral stent.  She has no known history of cardiac disease or arrhythmia.  She presented to the emergency department 3 days ago with altered mental status.  Of note, she had just been discharged 2 days earlier for similar episode of syncope and mental status change.  During her most recent admission, the admitting doctor suggested to the family that a cardiology consult be arranged and event monitor placed.  Cardiology was not consulted during either of the patient's hospitalizations.  She was discharged home yesterday.  Bilateral carotid artery study was performed and is copied and pasted below with bilateral atherosclerotic plaque, most prominent in the proximal left ICA, and estimated degree of stenosis less than 50% bilaterally.  Echo showed EF 55-60, NR WMA, mild MR, mild to moderate TR, trivial AR.  She is joined today by her daughter.  She reports 3 episodes of recent syncope.  The first episode occurred 10/03/2019 and was the first time that she ever lost consciousness.  The episode occurred at lunch and was preceded by slurred speech, followed by throwing up and LOC.  The next episode occurred last Thursday 11/18 and shortly after breakfast. She felt a wave of warmth come over her and then subsequently had an episode of emesis then passed out on the floor.  The last  episode occurred this past Sunday 11/21 and after eating breakfast.  She dropped her head at the breakfast table and had another episode of emesis before losing consciousness.  She denies any dizziness, racing heart rate, or palpitations before these episodes or at any time. No reported shortness of breath at rest or with exertion. She reports floaters with could be consistent with amaurosis fugax and recent bilateral carotid study as above.  No s/sx of volume overload, including orthopnea, PND, or LEE reported. She does note early satiety and that she has had issues with eating large amounts for sometime.  She denies any nausea with eating or any associated nausea before her emesis. No s/sx of bleeding / GI bleed.   She reports that she quit smoking in 1991.  She drinks 1.5 cups of coffee daily.  She drinks 2 glasses of wine daily and has been doing so for the past 10 years.  She denies any drug use.  Home Medications    Current Outpatient Medications on File Prior to Visit  Medication Sig Dispense Refill  . acetaminophen (TYLENOL) 500 MG tablet Take 1,000 mg by mouth every 6 (six) hours as needed for moderate pain.     . COMBIVENT RESPIMAT 20-100 MCG/ACT AERS respimat INHALE 2 PUFFS INTO THE LUNGS 3 TIMES DAILY 4 g 5  . folic acid (FOLVITE) 1 MG tablet Take 1 tablet (1 mg total) by mouth daily. 30 tablet 2  . vitamin B-12 1000 MCG tablet Take 1 tablet (1,000 mcg total) by mouth daily. 30 tablet 2  . [START ON 12/24/2019] Vitamin D, Ergocalciferol, (DRISDOL) 1.25 MG (50000 UNIT) CAPS capsule Take 1 capsule (50,000 Units total) by mouth every 7 (seven) days. 5 capsule 0   No current facility-administered medications on file prior to visit.    Review of Systems    She denies chest pain, palpitations, dyspnea, pnd, orthopnea, dizziness, edema, weight gain.  She reports syncope x3 as outlined above.  She notes symptoms consistent with amaurosis fugax.  She has noted early satiety for some  time/difficulty eating with all 3 episodes associated with eating and emesis.  Asymmetric lower extremity edema noted on exam, which she had not noted on her own prior to this visit.   All other systems reviewed and are otherwise negative except as noted above.  Physical Exam    VS:  BP 100/60 (BP Location: Right Arm, Patient Position: Sitting, Cuff Size: Normal)   Pulse 97   Ht 5\' 3"  (1.6 m)   Wt 140 lb 8 oz (63.7 kg)   SpO2 96%   BMI 24.89 kg/m  , BMI Body mass index is 24.89 kg/m. GEN: Elderly female, joined by daughter, in no acute distress. HEENT: normal. Neck: Supple, no JVD, +bilateral carotid bruits, or masses. Cardiac: RRR, 1/6 systolic murmur. No rubs, or gallops. No clubbing, cyanosis. Asymmetric edema noted with LLE 1+ at the ankle and RLE without edema.  Radials/DP/PT 2+ and equal bilaterally.  Respiratory:  Respirations regular and unlabored, clear to auscultation bilaterally. GI: Soft, nontender, nondistended, BS + x 4. MS: no deformity or atrophy. Skin: warm and dry, no rash. Neuro:  Strength and sensation are intact. Psych: Normal affect.  Accessory Clinical Findings    ECG personally reviewed by me today -NSR, 97 bpm, left axis deviation, LVH, previous findings of septal infarct versus lead placement, QTC 462.  EKG reviewed with Dr. Garen Lah during today's visit. - no acute changes.  VITALS Reviewed today   Temp Readings from Last 3 Encounters:  12/21/19 (!) 97.3 F (36.3 C) (Oral)  12/17/19 98.3 F (36.8 C) (Oral)  08/20/19 97.8 F (36.6 C) (Oral)   BP Readings from Last 3 Encounters:  12/22/19 100/60  12/21/19 (!) 129/96  12/17/19 (!) 153/78   Pulse Readings from Last 3 Encounters:  12/22/19 97  12/21/19 82  12/17/19 79    Wt Readings from Last 3 Encounters:  12/22/19 140 lb 8 oz (63.7 kg)  12/19/19 140 lb (63.5 kg)  12/16/19 141 lb 1.5 oz (64 kg)     LABS  reviewed today   Lab Results  Component Value Date   WBC 6.0 12/20/2019   HGB  12.2 12/20/2019   HCT 37.1 12/20/2019   MCV 93.7 12/20/2019   PLT 303 12/20/2019   Lab Results  Component Value Date   CREATININE 0.78 12/20/2019   BUN 16 12/20/2019   NA 133 (L) 12/20/2019  K 3.9 12/20/2019   CL 99 12/20/2019   CO2 25 12/20/2019   Lab Results  Component Value Date   ALT 13 12/19/2019   AST 20 12/19/2019   ALKPHOS 52 12/19/2019   BILITOT 0.8 12/19/2019   Lab Results  Component Value Date   CHOL 175 12/20/2019   HDL 73 12/20/2019   LDLCALC 91 12/20/2019   TRIG 53 12/20/2019   CHOLHDL 2.4 12/20/2019    Lab Results  Component Value Date   HGBA1C 5.1 12/20/2019   Lab Results  Component Value Date   TSH 3.561 12/16/2019     STUDIES/PROCEDURES reviewed today   Cardiovascular History & Procedures: Cardiovascular Problems:  Syncope  Non-Invasive Evaluation(s):  TTE (12/17/2019): Normal LV size and wall thickness.  LVEF 55-60% with normal diastolic function.  Normal RV size and function.  Mild left atrial enlargement.  Degenerative mitral valve with mild to moderate regurgitation.  Mild to moderate tricuspid regurgitation.  Trivial aortic regurgitation.  11/2019 Carotid ultrasound IMPRESSION: 1. Atherosclerotic plaque involving the bilateral carotid arteries. Plaque is most prominent in the proximal left internal carotid artery. Estimated degree of stenosis in the internal carotid arteries is less than 50% bilaterally. 2. Patent vertebral arteries with antegrade flow.  Orthostatic Vital Signs: Negative  Assessment & Plan    Syncope --3 episodes of syncope as described in HPI above. Detailed the possible etiologies of syncope.  11/22 labs show electrolytes and renal function WNL.  Denies any chest pain, shortness of breath, dyspnea, or dizziness.  No reported racing heart rate or palpitations.  EKG without acute ST/T changes and NSR without ectopy.  Echo 11/19 with EF 55 to 60%, degenerative mitral valve with mild to moderate MR and mild TR.   Orthostatics today negative, though with patient reporting dizziness when moving from lying to sitting per RN.  She is not on any AV nodal blocking agents or antihypertensives. Considered is possible vasovagal etiology, given each has been associated with emesis after eating, reported feeling of warmth, known history of diffuse large cell lymphoma stage III s/p 6 cycles chemotherapy. Given asymmetric edema on exam, obtain LLE [place] today before she leaves the clinic to rule out DVT/PE.  Will place 2-week live Zio monitoring to rule out arrhythmia. Given nl EF by echo and EKG without acute ST/T changes, we will defer ischemic workup at this time and reassess at RTC. Recommended very low dose Crestor 5 mg daily for risk factor modification. Will check BMET, CBC.  Recent TSH WNL.  Hydration encouraged, along with reduction of EtOH (currently 2 glasses nightly). UTI workup deferred to PCP as below. If possible, monitor BP at home.  Instructed patient to call the office if any further syncopal episodes.   Asymmetric edema --Asymmetric edema noted on exam with left lower extremity edema 1+ and right lower extremity edema not present.  She denies any shortness of breath or dyspnea.  No reported warmth, palpable cord, or pain of her left lower extremity/calf.  Initially, we were unable to obtain oxygen saturations on her today.  On recheck, oxygen 96%.  Given no lower extremity ultrasound was performed during her visit to the ED, and in the setting of ongoing syncope, we will perform a lower extremity ultrasound same day to rule out possibility of syncope 2/2 DVT or pulmonary embolism.  Further recommendations, if indicated, pending the study.  Abnormal UA 11/18 --Consider further/repeat workup of 11/18 UA results given history of recurrent UTI - per PCP.  Bilateral carotid disease --  Carotid ultrasound as above showed bilateral plaque, most prominent in the proximal LICA, and b/l less than 50%.  She does note some  symptoms consistent with amaurosis fugax.  Continue periodic carotid ultrasounds.  Started on low-dose Crestor 5 mg daily.  Repeat lipid and liver function in 6 to 8 weeks.  Diffuse large cell lymphoma stage III --Consider this as contributing to her above syncopal episodes. Continue to follow with PCP/oncology as recommended.   Medication changes: Start Crestor 5 mg daily. Labs ordered: CBC, BMET.  Repeat lipid and liver function in 6 to 8 weeks. Studies / Imaging ordered: Left lower extremity ultrasound. Disposition: RTC 5 weeks or sooner if needed.  Total time spent with patient today 30 minutes. This includes reviewing records, evaluating the patient, and coordinating care. Face-to-face time >50%.    Arvil Chaco, [name] 12/22/2019

## 2019-12-21 NOTE — Progress Notes (Signed)
Neurology Progress note  - Pt is close to baseline as per pt and daughter at bedside - imaging reviewed and no acute abnormalities  - EEG yesterday with normal awake EEG   Past Medical History:  Diagnosis Date  . Actinic keratosis   . Adnexal mass 06/03/2014   since 2009  . Arthritis   . Complication of anesthesia   . COPD (chronic obstructive pulmonary disease) ([place])   . Diffuse large cell lymphoma in remission ([place]) 01/2007   NON-HODGKINS-stage 3, cd 20 positive; status post 6 cycles of R-CHOP  . Dyspnea    with exertion  . GERD (gastroesophageal reflux disease)   . Herpes zoster without complication 05/03/2701  . History of hiatal hernia   . History of kidney stones   . HOH (hard of hearing)    wears hearing aides  . Non Hodgkin's lymphoma ([place])   . Personal history of chemotherapy   . PONV (postoperative nausea and vomiting)    in January lasted about 4 hours  . Recurrent UTI   . Squamous cell carcinoma of skin 11/09/2008   Right post. lat. elbow. SCCis arising in [place]. Excised 01/04/2009, margins free.    Past Surgical History:  Procedure Laterality Date  . ABDOMINAL SURGERY  2009   abdominal mass+ NH lymphoma,  . BACK SURGERY  01/2017   fusion. metal plate in neck at back  . CATARACT EXTRACTION  1997   right eye and left ey  . cervical neck fusion  1995  . CYSTOSCOPY W/ RETROGRADES Left 01/30/2018   Procedure: CYSTOSCOPY WITH RETROGRADE PYELOGRAM;  Surgeon: Billey Co, MD;  Location: [facility];  Service: Urology;  Laterality: Left;  . CYSTOSCOPY W/ RETROGRADES Left 08/05/2018   Procedure: CYSTOSCOPY WITH RETROGRADE PYELOGRAM;  Surgeon: Billey Co, MD;  Location: [facility];  Service: Urology;  Laterality: Left;  . CYSTOSCOPY W/ URETERAL STENT PLACEMENT Left 08/05/2018   Procedure: CYSTOSCOPY WITH STENT Exchange;  Surgeon: Billey Co, MD;  Location: [facility];  Service: Urology;  Laterality: Left;  . CYSTOSCOPY W/ URETERAL STENT PLACEMENT Left 05/21/2019    Procedure: CYSTOSCOPY WITH STENT REPLACEMENT;  Surgeon: Billey Co, MD;  Location: [facility];  Service: Urology;  Laterality: Left;  . CYSTOSCOPY WITH BIOPSY Left 02/27/2018   Procedure: CYSTOSCOPY WITH BIOPSY;  Surgeon: Billey Co, MD;  Location: [facility];  Service: Urology;  Laterality: Left;  . CYSTOSCOPY WITH STENT PLACEMENT Left 01/30/2018   Procedure: CYSTOSCOPY WITH STENT PLACEMENT;  Surgeon: Billey Co, MD;  Location: [facility];  Service: Urology;  Laterality: Left;  . CYSTOSCOPY WITH URETEROSCOPY AND STENT PLACEMENT Left 02/27/2018   Procedure: CYSTOSCOPY WITH URETEROSCOPY AND STENT Exchange;  Surgeon: Billey Co, MD;  Location: [facility];  Service: Urology;  Laterality: Left;  . DENTAL SURGERY     screws  . JOINT REPLACEMENT Left    knee  . KNEE ARTHROPLASTY Right 12/23/2018   Procedure: RIGHT COMPUTER ASSISTED TOTAL KNEE ARTHROPLASTY;  Surgeon: Dereck Leep, MD;  Location: [facility];  Service: Orthopedics;  Laterality: Right;  . KYPHOPLASTY N/A 02/21/2017   Procedure: [id number];  Surgeon: Hessie Knows, MD;  Location: [facility];  Service: Orthopedics;  Laterality: N/A;  . laparotomy with biopsy  03/02/2007  . PORTACATH PLACEMENT  2009  . [place]  . SQUAMOUS CELL CARCINOMA EXCISION     right arm  . TOTAL KNEE ARTHROPLASTY Left    2009  . URETEROSCOPY Left 01/30/2018   Procedure:  URETEROSCOPY;  Surgeon: Billey Co, MD;  Location: [facility];  Service: Urology;  Laterality: Left;  Marland Kitchen VAGINAL HYSTERECTOMY  1971    Current Facility-Administered Medications:  .  acetaminophen (TYLENOL) tablet 650 mg, 650 mg, Oral, Q6H PRN **OR** acetaminophen (TYLENOL) suppository 650 mg, 650 mg, Rectal, Q6H PRN, Cox, Amy N, DO .  cyanocobalamin ((VITAMIN B-12)) injection 1,000 mcg, 1,000 mcg, Intramuscular, Q1200, Cox, Amy N, DO, 1,000 mcg at 12/20/19 1433 .  enoxaparin (LOVENOX) injection 40 mg, 40 mg, Subcutaneous, Q24H, Shanlever, Pierce Crane, RPH, 40 mg at  12/20/19 2042 .  folic acid (FOLVITE) tablet 1 mg, 1 mg, Oral, Daily, Cox, Amy N, DO, 1 mg at 12/20/19 0903 .  Ipratropium-Albuterol (COMBIVENT) respimat 1 puff, 1 puff, Inhalation, Daily, Cox, Amy N, DO, 1 puff at 12/20/19 0903 .  ondansetron (ZOFRAN) tablet 4 mg, 4 mg, Oral, Q6H PRN **OR** ondansetron (ZOFRAN) injection 4 mg, 4 mg, Intravenous, Q6H PRN, Cox, Amy N, DO .  Vitamin D (Ergocalciferol) (DRISDOL) capsule 50,000 Units, 50,000 Units, Oral, Q7 days, Cox, Amy N, DO, 50,000 Units at 12/20/19 0903   Family History  Problem Relation Age of Onset  . Breast cancer Sister   . Dementia Sister   . Cataracts Sister   . Heart attack Brother   . CAD Brother   . Heart attack Brother   . Leukemia Grandchild        granddaughter  . Kidney disease Neg Hx   . Bladder Cancer Neg Hx      Social History:  reports that she quit smoking about 30 years ago. Her smoking use included cigarettes. She smoked 0.25 packs per day. She has never used smokeless tobacco. She reports current alcohol use of about 14.0 standard drinks of alcohol per week. She reports that she does not use drugs.  Exam: Current vital signs: BP 115/71 (BP Location: Left Arm)   Pulse 89   Temp 97.8 F (36.6 C)   Resp 16   Ht 5\' 2"  (1.575 m)   Wt 63.5 kg   SpO2 91%   BMI 25.61 kg/m  Vital signs in last 24 hours: Temp:  [97.5 F (36.4 C)-98.8 F (37.1 C)] 97.8 F (36.6 C) (11/23 0820) Pulse Rate:  [74-94] 89 (11/23 0820) Resp:  [16-18] 16 (11/23 0820) BP: (107-136)/(59-90) 115/71 (11/23 0820) SpO2:  [91 %-97 %] 91 % (11/23 0820)   Physical Exam  Constitutional: Appears well-developed and well-nourished.  Psych: Affect appropriate to situation Eyes: No scleral injection HENT: No OP obstruction, good dentition MSK: no joint deformities.  Cardiovascular: Perfusing extremities well Respiratory: Effort normal, non-labored breathing GI: Soft.  No distension. There is no tenderness.  Skin: WDI  Neuro: Mental  Status: Patient is awake, alert, oriented to person, place, month, year, and situation  Patient is able to give some history with details corrected by daughter at beside (for example, couldn't name all family members at the table with her this morning, but named 3 out of 4 of them), was able to appropriately affirm / deny specific questions but gave limited history to open ended items  No clear aphasia (more recall effect) or neglect Could recall what her daughter had cooked for her yesterday by choices but not spontaneously Cranial Nerves: II: Visual Fields are full. Pupils are equal, round, and reactive to light.   III,IV, VI: EOMI without ptosis or diploplia.  V: Facial sensation is symmetric to temperature VII: Facial movement is symmetric.  VIII: hearing is intact to  voice X: Uvula elevates symmetrically XI: Shoulder shrug is symmetric. XII: tongue is midline without atrophy or fasciculations.  Motor: Tone is normal. Bulk is normal. 5/5 strength was present in all four extremities.Mild RUE pronation w/o drift Sensory: Sensation is symmetric to light touch and temperature in the arms and legs. Deep Tendon Reflexes: 3+ and symmetric in the biceps and patellae.  Plantars: Toes are downgoing bilaterally.  Cerebellar: FNF and HKS are intact bilaterally   I have reviewed labs in epic and the results pertinent to this consultation are:  Lab Results  Component Value Date   HGBA1C 5.1 12/20/2019    Lab Results  Component Value Date   CHOL 175 12/20/2019   HDL 73 12/20/2019   LDLCALC 91 12/20/2019   TRIG 53 12/20/2019   CHOLHDL 2.4 12/20/2019     Lab Results  Component Value Date   TSH 3.561 12/16/2019   T4TOTAL 5.2 06/07/2016   Lab Results  Component Value Date   VITAMINB12 146 (L) 12/16/2019   Last vitamin D Lab Results  Component Value Date   VD25OH 15.57 (L) 12/16/2019   AKI (Cr 1.03 from baseline of 0.77) Mild hypokalemia to 3.4, stable  ECHO from last week  as noted in HPI  Unresulted Labs (From admission, onward)          Start     Ordered   12/19/19 1357  Urine Drug Screen, Qualitative ([place] only)  Once,   STAT        12/19/19 1356   12/19/19 1039  Urinalysis, Complete w Microscopic  ONCE - STAT,   STAT        12/19/19 1038          I have reviewed the images obtained:   CT head 11/18 personally reviewed  Chronic microvascular changes periventricularly, ex-vacuo ventriculomegly   RUQ [place] 11/21 -- negative  MRI head/ MRA normal studies   Impression: This is a delightful 86 year old woman with a past medical history significant for diffuse large B cell lymphoma in remission, COPD, presenting with recurrent altered mental status episode.  Recommendations:  # Transient alterations in mental status - Improved and back to baseline  - Imaging is normal - EEG normal awake study  - B12 oral supplementation for total 7 days - awaiting d/c planning today - arrangement of home health prior to discharge

## 2019-12-21 NOTE — Discharge Summary (Signed)
Physician Discharge Summary  [place] [facility]:[id number] DOB: [birth year] DOA: 12/19/2019  PCP: Birdie Sons, MD  Admit date: 12/19/2019 Discharge date: 12/21/2019  Admitted From: Home Disposition:  Home with home health  Recommendations for Outpatient Follow-up:  1. Follow up with PCP in 1-2 weeks 2.   Follow-up with outpatient neurology  Home Health: Yes Equipment/Devices: None Discharge Condition: Stable CODE STATUS: DNR Diet recommendation: Regular  Brief/Interim Summary:  86 y.o.femalewith medical history significant forrecurrent/frequent UTI, history of left UPJ obstruction managed with indwelling urethral stent last exchanged by Dr. Jeb Levering on 05/21/2019, diffuse large cell lymphoma stage III, CD20 positive, status post 6 cycles of R-CHOP, recent hospitalization for mental status change and loss of consciousness staring up at the kitchen table.   She was brought by her daughter who presentedto the emergency department for chief concerns of staring off into space, then slumping over at the breakfast table. After she had return of consciousness, daughter reports patient had nausea and vomited her breakfast.  Daughter denies seizure activity, urinary or bowel incontinence  11/22: Patient's imaging survey including MRI, MRA, carotid duplex unrevealing for acute cause.  Neurology consulted from admission.  EEG pending at this time.  Daughter at bedside this morning.  Patient appears at baseline level of mentation and repeatedly asks if she can go home.  11/23: Patient back to baseline neurologic status.  No recurrence of transient staring episodes.  Imaging survey and EEG negative.  Patient stable for discharge at this time.  Will discharge home with home health services.  Follow-up outpatient PCP.  Consider referral to outpatient neurology  Discharge Diagnoses:  Principal Problem:   Staring episodes Active Problems:   Adnexal mass   H/O non-Hodgkin's lymphoma    Obstruction of left ureteropelvic junction (UPJ)   Syncope   B12 deficiency   Vitamin D deficiency   Frequent UTI  Transient altered mentation Neurology consulted Possible TIA but differential remains broad MRI/MRA negative Chest abdomen pelvis CT also negative Carotid duplex reassuring U tox negative EEG negative No further neurologic diagnostics Therapy evaluations, recommend home health Stable for discharge home with home health services Follow-up outpatient PCP, consider referral to neurology  B12 deficiency Iron replacement subcutaneous while in house Can resume oral supplementation on discharge  Vitamin D deficiency oral replacement  Recurrent UTI No evidence of urinary tract infection currently  Left chronic UPJ obstruction status post indwelling chronic urethral stents placement outpatient follow-up   Discharge Instructions  Discharge Instructions    Diet - low sodium heart healthy   Complete by: As directed    Increase activity slowly   Complete by: As directed      Allergies as of 12/21/2019      Reactions   Milk-related Compounds Diarrhea, Nausea And Vomiting   Gas too   Lac Bovis Diarrhea, Nausea And Vomiting   Milk allergy   Sulfa Antibiotics Hives, Itching      Medication List    TAKE these medications   acetaminophen 500 MG tablet Commonly known as: TYLENOL Take 1,000 mg by mouth every 6 (six) hours as needed for moderate pain.   Combivent Respimat 20-100 MCG/ACT Aers respimat Generic drug: Ipratropium-Albuterol INHALE 2 PUFFS INTO THE LUNGS 3 TIMES DAILY What changed: See the new instructions.   cyanocobalamin 1000 MCG tablet Take 1 tablet (1,000 mcg total) by mouth daily.   folic acid 1 MG tablet Commonly known as: FOLVITE Take 1 tablet (1 mg total) by mouth daily.   Vitamin D (Ergocalciferol)  1.25 MG (50000 UNIT) Caps capsule Commonly known as: DRISDOL Take 1 capsule (50,000 Units total) by mouth every 7 (seven)  days. Start taking on: December 24, 2019       Follow-up Information    Anabel Bene, MD. Schedule an appointment as soon as possible for a visit on 02/08/2020.   Specialty: Neurology Why: at 11:30am  Contact information: [place] [address] [phone]              Allergies  Allergen Reactions  . Milk-Related Compounds Diarrhea and Nausea And Vomiting    Gas too  . Lac Bovis Diarrhea and Nausea And Vomiting    Milk allergy   . Sulfa Antibiotics Hives and Itching    Consultations: Neurology  Procedures/Studies: EEG  Result Date: 12/20/2019 Lora Havens, MD     12/20/2019  4:42 PM Patient Name: KANYLAH MUENCH MRN: [id number] Epilepsy Attending: Lora Havens Referring Physician/Provider: Dr Lesleigh Noe Date: 12/20/2019 Duration: 22.36 mins Patient history: 86 year old woman with a past medical history significant for diffuse large B cell lymphoma in remission, COPD, presenting with recurrent altered mental status episode. EEG to evaluate for seizure. Level of alertness: Awake AEDs during EEG study: None Technical aspects: This EEG study was done with scalp electrodes positioned according to the 10-20 International system of electrode placement. Electrical activity was acquired at a sampling rate of 500Hz  and reviewed with a high frequency filter of 70Hz  and a low frequency filter of 1Hz . EEG data were recorded continuously and digitally stored. Description: The posterior dominant rhythm consists of 8 Hz activity of moderate voltage (25-35 uV) seen predominantly in posterior head regions, symmetric and reactive to eye opening and eye closing. Physiologic photic driving was not seen during photic stimulation.  Hyperventilation was not performed.   IMPRESSION: This study is within normal limits. No seizures or definite epileptiform discharges were seen throughout the recording. Lora Havens   CT Head Wo Contrast  Result Date:  12/16/2019 CLINICAL DATA:  Head trauma, minor. Additional provided: Syncope this morning, patient found down. EXAM: CT HEAD WITHOUT CONTRAST TECHNIQUE: Contiguous axial images were obtained from the base of the skull through the vertex without intravenous contrast. COMPARISON:  Head CT 01/09/2013. FINDINGS: Brain: Mild generalized cerebral atrophy. Advanced ill-defined hypoattenuation within the cerebral white matter is nonspecific, but compatible with chronic small vessel ischemic disease. There is no acute intracranial hemorrhage. No demarcated cortical infarct. No extra-axial fluid collection. No evidence of intracranial mass. No midline shift. Vascular: No hyperdense vessel.  Atherosclerotic calcifications. Skull: No calvarial fracture. Nonspecific thinning of the right parietal calvarium. Sinuses/Orbits: Visualized orbits show no acute finding. No significant paranasal sinus disease at the imaged levels. IMPRESSION: No evidence of acute intracranial abnormality. Mild cerebral atrophy with advanced chronic small vessel ischemic disease, progressed as compared to the head CT of 01/09/2013. Nonspecific thinning of the right parietal calvarium. Electronically Signed   By: Kellie Simmering DO   On: 12/16/2019 12:59   MR ANGIO HEAD WO CONTRAST  Result Date: 12/19/2019 CLINICAL DATA:  Initial evaluation for acute delirium, confusional episode with subsequent loss of consciousness. History ofDLBCL in remission. EXAM: MRI HEAD WITHOUT AND WITH CONTRAST MRA HEAD WITHOUT CONTRAST TECHNIQUE: Multiplanar, multiecho pulse sequences of the brain and surrounding structures were obtained without and with intravenous contrast. Angiographic images of the head were obtained using MRA technique without contrast. CONTRAST:  7.52mL GADAVIST GADOBUTROL 1 MMOL/ML IV SOLN COMPARISON:  Prior CT from 12/16/2019  FINDINGS: MRI HEAD FINDINGS Brain: Generalized age appropriate cerebral atrophy. Patchy and confluent T2/FLAIR hyperintensity  involving the periventricular deep white matter both cerebral hemispheres, most consistent with chronic small vessel ischemic disease, mild to moderate in nature. Patchy involvement of the pons and likely cerebellar vermis noted as well. No abnormal restricted diffusion to suggest acute or subacute ischemia. Gray-white matter differentiation well maintained. No encephalomalacia to suggest chronic cortical infarction. No foci of susceptibility artifact to suggest acute or chronic intracranial hemorrhage. No mass lesion, midline shift or mass effect. No evidence for recurrent lymphoma within the CS. Mild ventricular prominence related to global parenchymal volume loss without hydrocephalus. No extra-axial fluid collection. Pituitary gland suprasellar region within normal limits. Midline structures intact. No abnormal enhancement. Vascular: Major intracranial vascular flow voids are well maintained. Skull and upper cervical spine: Craniocervical junction within normal limits. Bone marrow signal intensity normal. No scalp soft tissue abnormality. Sinuses/Orbits: Patient status post bilateral ocular lens replacement. Globes and orbital soft tissues demonstrate no acute finding. Paranasal sinuses are clear. No mastoid effusion. Inner ear structures within normal limits. Other: None. MRA HEAD FINDINGS ANTERIOR CIRCULATION: Examination mildly degraded by motion artifact. Visualized distal cervical segments of the internal carotid arteries are widely patent with antegrade flow. Petrous, cavernous, and supraclinoid segments widely patent bilaterally. Dominant left A1 widely patent. Right A1 hypoplastic and/or absent, accounting for the diminutive right ICA is compared to the left. Azygos ACA is widely patent to its distal aspect. No M1 stenosis or occlusion. Normal MCA bifurcations. Distal MCA branches well perfused and symmetric. POSTERIOR CIRCULATION: Vertebral arteries widely patent to the vertebrobasilar junction without  stenosis. Apparent fairly symmetric defects extending through the V4 segments on MIP reconstructions favored to be secondary to motion artifact through this region. Left vertebral artery dominant within apparent long segment fenestration. Both PICAs patent. Basilar patent to its distal aspect without stenosis. Superior cerebral arteries patent bilaterally. Both PCAs primarily supplied via the basilar well perfused or distal aspects. No intracranial aneurysm or other vascular abnormality. IMPRESSION: MRI HEAD IMPRESSION: 1. No acute intracranial abnormality. 2. Age-related cerebral atrophy with mild to moderate chronic small vessel ischemic disease. 3. No evidence for recurrent lymphoma within the CNS. MRA HEAD IMPRESSION: 1. Negative intracranial MRA. No large vessel occlusion, hemodynamically significant stenosis, or other acute vascular abnormality. 2. Azygos ACA supplied primarily via the left carotid artery system. Electronically Signed   By: Jeannine Boga M.D.   On: 12/19/2019 23:05   MR BRAIN W WO CONTRAST  Result Date: 12/19/2019 CLINICAL DATA:  Initial evaluation for acute delirium, confusional episode with subsequent loss of consciousness. History ofDLBCL in remission. EXAM: MRI HEAD WITHOUT AND WITH CONTRAST MRA HEAD WITHOUT CONTRAST TECHNIQUE: Multiplanar, multiecho pulse sequences of the brain and surrounding structures were obtained without and with intravenous contrast. Angiographic images of the head were obtained using MRA technique without contrast. CONTRAST:  7.30mL GADAVIST GADOBUTROL 1 MMOL/ML IV SOLN COMPARISON:  Prior CT from 12/16/2019 FINDINGS: MRI HEAD FINDINGS Brain: Generalized age appropriate cerebral atrophy. Patchy and confluent T2/FLAIR hyperintensity involving the periventricular deep white matter both cerebral hemispheres, most consistent with chronic small vessel ischemic disease, mild to moderate in nature. Patchy involvement of the pons and likely cerebellar vermis  noted as well. No abnormal restricted diffusion to suggest acute or subacute ischemia. Gray-white matter differentiation well maintained. No encephalomalacia to suggest chronic cortical infarction. No foci of susceptibility artifact to suggest acute or chronic intracranial hemorrhage. No mass lesion, midline shift or mass effect.  No evidence for recurrent lymphoma within the CS. Mild ventricular prominence related to global parenchymal volume loss without hydrocephalus. No extra-axial fluid collection. Pituitary gland suprasellar region within normal limits. Midline structures intact. No abnormal enhancement. Vascular: Major intracranial vascular flow voids are well maintained. Skull and upper cervical spine: Craniocervical junction within normal limits. Bone marrow signal intensity normal. No scalp soft tissue abnormality. Sinuses/Orbits: Patient status post bilateral ocular lens replacement. Globes and orbital soft tissues demonstrate no acute finding. Paranasal sinuses are clear. No mastoid effusion. Inner ear structures within normal limits. Other: None. MRA HEAD FINDINGS ANTERIOR CIRCULATION: Examination mildly degraded by motion artifact. Visualized distal cervical segments of the internal carotid arteries are widely patent with antegrade flow. Petrous, cavernous, and supraclinoid segments widely patent bilaterally. Dominant left A1 widely patent. Right A1 hypoplastic and/or absent, accounting for the diminutive right ICA is compared to the left. Azygos ACA is widely patent to its distal aspect. No M1 stenosis or occlusion. Normal MCA bifurcations. Distal MCA branches well perfused and symmetric. POSTERIOR CIRCULATION: Vertebral arteries widely patent to the vertebrobasilar junction without stenosis. Apparent fairly symmetric defects extending through the V4 segments on MIP reconstructions favored to be secondary to motion artifact through this region. Left vertebral artery dominant within apparent long  segment fenestration. Both PICAs patent. Basilar patent to its distal aspect without stenosis. Superior cerebral arteries patent bilaterally. Both PCAs primarily supplied via the basilar well perfused or distal aspects. No intracranial aneurysm or other vascular abnormality. IMPRESSION: MRI HEAD IMPRESSION: 1. No acute intracranial abnormality. 2. Age-related cerebral atrophy with mild to moderate chronic small vessel ischemic disease. 3. No evidence for recurrent lymphoma within the CNS. MRA HEAD IMPRESSION: 1. Negative intracranial MRA. No large vessel occlusion, hemodynamically significant stenosis, or other acute vascular abnormality. 2. Azygos ACA supplied primarily via the left carotid artery system. Electronically Signed   By: Jeannine Boga M.D.   On: 12/19/2019 23:05   US Carotid Bilateral  Result Date: 12/19/2019 CLINICAL DATA:  Syncope. EXAM: BILATERAL CAROTID DUPLEX ULTRASOUND TECHNIQUE: Pearline Cables scale imaging, color Doppler and duplex ultrasound were performed of bilateral carotid and vertebral arteries in the neck. COMPARISON:  None. FINDINGS: Criteria: Quantification of carotid stenosis is based on velocity parameters that correlate the residual internal carotid diameter with NASCET-based stenosis levels, using the diameter of the distal internal carotid lumen as the denominator for stenosis measurement. The following velocity measurements were obtained: RIGHT ICA: 74/16 cm/sec CCA: 562/56 cm/sec SYSTOLIC ICA/CCA RATIO:  0.6 ECA: 50 cm/sec LEFT ICA: 99/21 cm/sec CCA: 38/93 cm/sec SYSTOLIC ICA/CCA RATIO:  1.2 ECA: 48 cm/sec RIGHT CAROTID ARTERY: Small amount of plaque in the right common carotid artery. External carotid artery is patent with normal waveform. Normal waveforms and velocities in the internal carotid artery. RIGHT VERTEBRAL ARTERY: Antegrade flow and normal waveform in the right vertebral artery. LEFT CAROTID ARTERY: Echogenic plaque in the distal common carotid artery and bulb.  External carotid artery is patent with normal waveform. Echogenic plaque with shadowing in the proximal internal carotid artery. Normal waveforms and velocities in the internal carotid artery. LEFT VERTEBRAL ARTERY: Antegrade flow and normal waveform in the left vertebral artery. IMPRESSION: 1. Atherosclerotic plaque involving the bilateral carotid arteries. Plaque is most prominent in the proximal left internal carotid artery. Estimated degree of stenosis in the internal carotid arteries is less than 50% bilaterally. 2. Patent vertebral arteries with antegrade flow. Electronically Signed   By: Markus Daft M.D.   On: 12/19/2019 17:47   Portable chest  1 View  Result Date: 12/16/2019 CLINICAL DATA:  Syncope EXAM: PORTABLE CHEST 1 VIEW COMPARISON:  02/10/2018 FINDINGS: There is hyperinflation of the lungs compatible with COPD. Heart is borderline in size. Moderate-sized hiatal hernia. No confluent airspace opacities, effusions or edema. No acute bony abnormality. IMPRESSION: COPD.  Moderate-sized hiatal hernia.  No active disease. Electronically Signed   By: Rolm Baptise M.D.   On: 12/16/2019 17:16   ECHOCARDIOGRAM COMPLETE  Result Date: 12/17/2019    ECHOCARDIOGRAM REPORT   Patient Name:   PAMLEA FINDER [facility] Date of Exam: 12/17/2019 Medical Rec #:  [id number]           Height:       62.0 in Accession #:    [id number]          Weight:       141.1 lb Date of Birth:  [birth year]           BSA:          1.648 m Patient Age:    86 years            BP:           142/70 mmHg Patient Gender: F                   HR:           66 bpm. Exam Location:  [facility] Procedure: 2D Echo, Color Doppler and Cardiac Doppler Indications:     R55 Syncope  History:         Patient has no prior history of Echocardiogram examinations.                  COPD. Hx of chemotherapy.  Sonographer:     Charmayne Sheer RDCS (AE) Referring Phys:  [id number] Floyce Stakes GRIFFITH Diagnosing Phys: Serafina Royals MD  Sonographer Comments: Suboptimal apical window  and suboptimal subcostal window. IMPRESSIONS  1. Left ventricular ejection fraction, by estimation, is 55 to 60%. The left ventricle has normal function. The left ventricle has no regional wall motion abnormalities. Left ventricular diastolic parameters were normal.  2. Right ventricular systolic function is normal. The right ventricular size is normal.  3. Left atrial size was mildly dilated.  4. The mitral valve is degenerative. Mild to moderate mitral valve regurgitation.  5. Tricuspid valve regurgitation is mild to moderate.  6. The aortic valve is normal in structure. Aortic valve regurgitation is trivial. FINDINGS  Left Ventricle: Left ventricular ejection fraction, by estimation, is 55 to 60%. The left ventricle has normal function. The left ventricle has no regional wall motion abnormalities. The left ventricular internal cavity size was normal in size. There is  no left ventricular hypertrophy. Left ventricular diastolic parameters were normal. Right Ventricle: The right ventricular size is normal. No increase in right ventricular wall thickness. Right ventricular systolic function is normal. Left Atrium: Left atrial size was mildly dilated. Right Atrium: Right atrial size was normal in size. Pericardium: There is no evidence of pericardial effusion. Mitral Valve: The mitral valve is degenerative in appearance. There is moderate thickening of the posterior mitral valve leaflet(s). There is moderate calcification of the posterior mitral valve leaflet(s). Mildly decreased mobility of the mitral valve leaflets. Mild to moderate mitral annular calcification. Mild to moderate mitral valve regurgitation. MV peak gradient, 11.4 mmHg. The mean mitral valve gradient is 4.0 mmHg. Tricuspid Valve: The tricuspid valve is normal in structure. Tricuspid valve regurgitation is mild to moderate. Aortic Valve: The aortic  valve is normal in structure. Aortic valve regurgitation is trivial. Aortic valve mean gradient measures  2.0 mmHg. Aortic valve peak gradient measures 3.8 mmHg. Aortic valve area, by VTI measures 2.22 cm. Pulmonic Valve: The pulmonic valve was normal in structure. Pulmonic valve regurgitation is not visualized. Aorta: The aortic root and ascending aorta are structurally normal, with no evidence of dilitation. IAS/Shunts: No atrial level shunt detected by color flow Doppler.  LEFT VENTRICLE PLAX 2D LVIDd:         4.06 cm  Diastology LVIDs:         2.48 cm  LV e' medial:    5.11 cm/s LV PW:         0.94 cm  LV E/e' medial:  24.3 LV IVS:        0.70 cm  LV e' lateral:   6.96 cm/s LVOT diam:     2.10 cm  LV E/e' lateral: 17.8 LV SV:         45 LV SV Index:   27 LVOT Area:     3.46 cm  LEFT ATRIUM           Index LA diam:      3.90 cm 2.37 cm/m LA Vol (A2C): 23.1 ml 14.01 ml/m LA Vol (A4C): 43.8 ml 26.57 ml/m  AORTIC VALVE                   PULMONIC VALVE AV Area (Vmax):    2.44 cm    PV Vmax:       0.83 m/s AV Area (Vmean):   2.29 cm    PV Vmean:      57.000 cm/s AV Area (VTI):     2.22 cm    PV VTI:        0.125 m AV Vmax:           97.40 cm/s  PV Peak grad:  2.8 mmHg AV Vmean:          72.000 cm/s PV Mean grad:  2.0 mmHg AV VTI:            0.203 m AV Peak Grad:      3.8 mmHg AV Mean Grad:      2.0 mmHg LVOT Vmax:         68.70 cm/s LVOT Vmean:        47.600 cm/s LVOT VTI:          0.130 m LVOT/AV VTI ratio: 0.64  AORTA Ao Root diam: 3.40 cm MITRAL VALVE                TRICUSPID VALVE MV Area (PHT): 3.89 cm     TR Peak grad:   25.6 mmHg MV Peak grad:  11.4 mmHg    TR Vmax:        253.00 cm/s MV Mean grad:  4.0 mmHg MV Vmax:       1.69 m/s     SHUNTS MV Vmean:      97.7 cm/s    Systemic VTI:  0.13 m MV Decel Time: 195 msec     Systemic Diam: 2.10 cm MV E velocity: 124.00 cm/s MV A velocity: 153.00 cm/s MV E/A ratio:  0.81 Serafina Royals MD Electronically signed by Serafina Royals MD Signature Date/Time: 12/17/2019/4:11:10 PM    Final    US Abdomen Limited RUQ (LIVER/GB)  Result Date: 12/19/2019 CLINICAL DATA:   Right upper quadrant pain. EXAM: ULTRASOUND ABDOMEN LIMITED RIGHT UPPER QUADRANT COMPARISON:  None. FINDINGS: Gallbladder: No gallstones or wall thickening visualized. No sonographic Murphy sign noted by sonographer. Common bile duct: Diameter: 1.2 mm. Liver: No focal lesion identified. Within normal limits in parenchymal echogenicity. Portal vein is patent on color Doppler imaging with normal direction of blood flow towards the liver. Other: None. IMPRESSION: No acute findings. Electronically Signed   By: Marin Olp M.D.   On: 12/19/2019 13:39    (Echo, Carotid, EGD, Colonoscopy, ERCP)    Subjective: Seen and examined the day of discharge.  Baseline neurologic status.  Stable, no distress.  No pain complaints.  Stable for discharge home  Discharge Exam: Vitals:   12/21/19 0333 12/21/19 0820  BP: 115/61 115/71  Pulse: 86 89  Resp: 17 16  Temp: (!) 97.5 F (36.4 C) 97.8 F (36.6 C)  SpO2: 95% 91%   Vitals:   12/20/19 1900 12/21/19 0000 12/21/19 0333 12/21/19 0820  BP: 110/90 (!) 107/59 115/61 115/71  Pulse: 94 86 86 89  Resp: 18 16 17 16   Temp: 98.4 F (36.9 C) 98.6 F (37 C) (!) 97.5 F (36.4 C) 97.8 F (36.6 C)  TempSrc:   Oral   SpO2:  93% 95% 91%  Weight:      Height:        General: Pt is alert, awake, not in acute distress Cardiovascular: RRR, S1/S2 +, no rubs, no gallops Respiratory: CTA bilaterally, no wheezing, no rhonchi Abdominal: Soft, NT, ND, bowel sounds + Extremities: no edema, no cyanosis    The results of significant diagnostics from this hospitalization (including imaging, microbiology, ancillary and laboratory) are listed below for reference.     Microbiology: Recent Results (from the past 240 hour(s))  Resp Panel by RT-PCR (Flu A&B, Covid) Nasopharyngeal Swab     Status: None   Collection Time: 12/16/19  2:44 PM   Specimen: Nasopharyngeal Swab; Nasopharyngeal(NP) swabs in vial transport medium  Result Value Ref Range Status   SARS Coronavirus  2 by RT PCR NEGATIVE NEGATIVE Final    Comment: (NOTE) SARS-CoV-2 target nucleic acids are NOT DETECTED.  The SARS-CoV-2 RNA is generally detectable in upper respiratory specimens during the acute phase of infection. The lowest concentration of SARS-CoV-2 viral copies this assay can detect is 138 copies/mL. A negative result does not preclude SARS-Cov-2 infection and should not be used as the sole basis for treatment or other patient management decisions. A negative result may occur with  improper specimen collection/handling, submission of specimen other than nasopharyngeal swab, presence of viral mutation(s) within the areas targeted by this assay, and inadequate number of viral copies(<138 copies/mL). A negative result must be combined with clinical observations, patient history, and epidemiological information. The expected result is Negative.  Fact Sheet for Patients:  EntrepreneurPulse.com.au  Fact Sheet for Healthcare Providers:  IncredibleEmployment.be  This test is no t yet approved or cleared by the [place] FDA and  has been authorized for detection and/or diagnosis of SARS-CoV-2 by FDA under an Emergency Use Authorization (EUA). This EUA will remain  in effect (meaning this test can be used) for the duration of the COVID-19 declaration under Section 564(b)(1) of the Act, 21 U.S.C.section 360bbb-3(b)(1), unless the authorization is terminated  or revoked sooner.       Influenza A by PCR NEGATIVE NEGATIVE Final   Influenza B by PCR NEGATIVE NEGATIVE Final    Comment: (NOTE) The Xpert Xpress SARS-CoV-2/FLU/RSV plus assay is intended as an aid in the diagnosis of influenza from Nasopharyngeal swab specimens and  should not be used as a sole basis for treatment. Nasal washings and aspirates are unacceptable for Xpert Xpress SARS-CoV-2/FLU/RSV testing.  Fact Sheet for Patients: EntrepreneurPulse.com.au  Fact  Sheet for Healthcare Providers: IncredibleEmployment.be  This test is not yet approved or cleared by the [place] FDA and has been authorized for detection and/or diagnosis of SARS-CoV-2 by FDA under an Emergency Use Authorization (EUA). This EUA will remain in effect (meaning this test can be used) for the duration of the COVID-19 declaration under Section 564(b)(1) of the Act, 21 U.S.C. section 360bbb-3(b)(1), unless the authorization is terminated or revoked.  Performed at [facility], [address] [place], [place] [address]      Labs: BNP (last 3 results) Recent Labs    12/16/19 1138  BNP 89.1   Basic Metabolic Panel: Recent Labs  Lab 12/16/19 1139 12/17/19 0423 12/19/19 1021 12/20/19 0340  NA 137 135 135 133*  K 4.0 3.4* 3.4* 3.9  CL 100 102 96* 99  CO2 27 24 26 25   GLUCOSE 131* 99 156* 107*  BUN 19 15 14 16   CREATININE 0.98 0.77 1.03* 0.78  CALCIUM 8.7* 8.5* 9.2 8.9  MG  --   --  2.2  --    Liver Function Tests: Recent Labs  Lab 12/19/19 1021  AST 20  ALT 13  ALKPHOS 52  BILITOT 0.8  PROT 7.2  ALBUMIN 4.1   No results for input(s): LIPASE, AMYLASE in the last 168 hours. No results for input(s): AMMONIA in the last 168 hours. CBC: Recent Labs  Lab 12/16/19 1139 12/17/19 0423 12/19/19 1021 12/20/19 0340  WBC 8.2 7.0 6.5 6.0  NEUTROABS  --   --  4.1  --   HGB 13.3 11.2* 13.6 12.2  HCT 40.1 33.9* 41.4 37.1  MCV 94.4 93.6 95.4 93.7  PLT 348 294 335 303   Cardiac Enzymes: Recent Labs  Lab 12/16/19 1139  CKTOTAL 45   BNP: Invalid input(s): POCBNP CBG: No results for input(s): GLUCAP in the last 168 hours. D-Dimer No results for input(s): DDIMER in the last 72 hours. Hgb A1c Recent Labs    12/20/19 0340  HGBA1C 5.1   Lipid Profile Recent Labs    12/20/19 0340  CHOL 175  HDL 73  LDLCALC 91  TRIG 53  CHOLHDL 2.4   Thyroid function studies No results for input(s): TSH, T4TOTAL, T3FREE,  THYROIDAB in the last 72 hours.  Invalid input(s): FREET3 Anemia work up No results for input(s): VITAMINB12, FOLATE, FERRITIN, TIBC, IRON, RETICCTPCT in the last 72 hours. Urinalysis    Component Value Date/Time   COLORURINE YELLOW (A) 12/16/2019 1448   APPEARANCEUR HAZY (A) 12/16/2019 1448   APPEARANCEUR Cloudy (A) 09/06/2019 1545   LABSPEC 1.013 12/16/2019 1448   LABSPEC 1.010 01/09/2013 0647   PHURINE 6.0 12/16/2019 1448   GLUCOSEU NEGATIVE 12/16/2019 1448   GLUCOSEU Negative 01/09/2013 0647   HGBUR LARGE (A) 12/16/2019 1448   BILIRUBINUR NEGATIVE 12/16/2019 1448   BILIRUBINUR Negative 09/06/2019 1545   BILIRUBINUR Negative 01/09/2013 0647   KETONESUR 5 (A) 12/16/2019 1448   PROTEINUR 30 (A) 12/16/2019 1448   UROBILINOGEN 0.2 09/02/2017 1042   NITRITE NEGATIVE 12/16/2019 1448   LEUKOCYTESUR MODERATE (A) 12/16/2019 1448   LEUKOCYTESUR 2+ 01/09/2013 0647   Sepsis Labs Invalid input(s): PROCALCITONIN,  WBC,  LACTICIDVEN Microbiology Recent Results (from the past 240 hour(s))  Resp Panel by RT-PCR (Flu A&B, Covid) Nasopharyngeal Swab     Status: None   Collection  Time: 12/16/19  2:44 PM   Specimen: Nasopharyngeal Swab; Nasopharyngeal(NP) swabs in vial transport medium  Result Value Ref Range Status   SARS Coronavirus 2 by RT PCR NEGATIVE NEGATIVE Final    Comment: (NOTE) SARS-CoV-2 target nucleic acids are NOT DETECTED.  The SARS-CoV-2 RNA is generally detectable in upper respiratory specimens during the acute phase of infection. The lowest concentration of SARS-CoV-2 viral copies this assay can detect is 138 copies/mL. A negative result does not preclude SARS-Cov-2 infection and should not be used as the sole basis for treatment or other patient management decisions. A negative result may occur with  improper specimen collection/handling, submission of specimen other than nasopharyngeal swab, presence of viral mutation(s) within the areas targeted by this assay, and  inadequate number of viral copies(<138 copies/mL). A negative result must be combined with clinical observations, patient history, and epidemiological information. The expected result is Negative.  Fact Sheet for Patients:  EntrepreneurPulse.com.au  Fact Sheet for Healthcare Providers:  IncredibleEmployment.be  This test is no t yet approved or cleared by the [place] FDA and  has been authorized for detection and/or diagnosis of SARS-CoV-2 by FDA under an Emergency Use Authorization (EUA). This EUA will remain  in effect (meaning this test can be used) for the duration of the COVID-19 declaration under Section 564(b)(1) of the Act, 21 U.S.C.section 360bbb-3(b)(1), unless the authorization is terminated  or revoked sooner.       Influenza A by PCR NEGATIVE NEGATIVE Final   Influenza B by PCR NEGATIVE NEGATIVE Final    Comment: (NOTE) The Xpert Xpress SARS-CoV-2/FLU/RSV plus assay is intended as an aid in the diagnosis of influenza from Nasopharyngeal swab specimens and should not be used as a sole basis for treatment. Nasal washings and aspirates are unacceptable for Xpert Xpress SARS-CoV-2/FLU/RSV testing.  Fact Sheet for Patients: EntrepreneurPulse.com.au  Fact Sheet for Healthcare Providers: IncredibleEmployment.be  This test is not yet approved or cleared by the [place] FDA and has been authorized for detection and/or diagnosis of SARS-CoV-2 by FDA under an Emergency Use Authorization (EUA). This EUA will remain in effect (meaning this test can be used) for the duration of the COVID-19 declaration under Section 564(b)(1) of the Act, 21 U.S.C. section 360bbb-3(b)(1), unless the authorization is terminated or revoked.  Performed at [facility], [address] [place], [place] [address]      Time coordinating discharge: Over 30 minutes  SIGNED:   Sidney Ace,  MD  [facility] 12/21/2019, 10:46 AM Pager   If 7PM-7AM, please contact night-coverage

## 2019-12-22 ENCOUNTER — Ambulatory Visit (INDEPENDENT_AMBULATORY_CARE_PROVIDER_SITE_OTHER): Payer: Medicare Other

## 2019-12-22 ENCOUNTER — Ambulatory Visit: Payer: Medicare Other | Admitting: Physician Assistant

## 2019-12-22 ENCOUNTER — Other Ambulatory Visit: Payer: Self-pay

## 2019-12-22 ENCOUNTER — Other Ambulatory Visit: Payer: Self-pay | Admitting: Internal Medicine

## 2019-12-22 ENCOUNTER — Encounter: Payer: Self-pay | Admitting: Physician Assistant

## 2019-12-22 VITALS — BP 100/60 | HR 97 | Ht 63.0 in | Wt 140.5 lb

## 2019-12-22 DIAGNOSIS — I6523 Occlusion and stenosis of bilateral carotid arteries: Secondary | ICD-10-CM | POA: Diagnosis not present

## 2019-12-22 DIAGNOSIS — R55 Syncope and collapse: Secondary | ICD-10-CM | POA: Diagnosis not present

## 2019-12-22 DIAGNOSIS — C8590 Non-Hodgkin lymphoma, unspecified, unspecified site: Secondary | ICD-10-CM | POA: Diagnosis not present

## 2019-12-22 DIAGNOSIS — Z87891 Personal history of nicotine dependence: Secondary | ICD-10-CM | POA: Diagnosis not present

## 2019-12-22 DIAGNOSIS — R600 Localized edema: Secondary | ICD-10-CM

## 2019-12-22 DIAGNOSIS — R2241 Localized swelling, mass and lump, right lower limb: Secondary | ICD-10-CM

## 2019-12-22 MED ORDER — ROSUVASTATIN CALCIUM 5 MG PO TABS: 5.0000 mg | ORAL_TABLET | Freq: Every day | ORAL | 5 refills | Status: DC

## 2019-12-22 NOTE — Patient Instructions (Signed)
Medication Instructions:  Your physician has recommended you make the following change in your medication:   START taking Crestor 5 MG: Take 1 tablet by mouth daily.  *If you need a refill on your cardiac medications before your next appointment, please call your pharmacy*   Lab Work:  Your physician recommends that you have a BMET, and a CBC drawn today.  If you have labs (blood work) drawn today and your tests are completely normal, you will receive your results only by: Marland Kitchen MyChart Message (if you have MyChart) OR . A paper copy in the mail If you have any lab test that is abnormal or we need to change your treatment, we will call you to review the results.   Testing/Procedures:   1. Your physician has requested that you have a lower or upper extremity venous duplex. This test is an ultrasound of the veins in the legs or arms. It looks at venous blood flow that carries blood from the heart to the legs or arms. Allow one hour for a Lower Venous exam. Allow thirty minutes for an Upper Venous exam. There are no restrictions or special instructions.   2.  Your physician has recommended that you wear a Zio monitor. This monitor is a medical device that records the heart's electrical activity. Doctors most often use these monitors to diagnose arrhythmias. Arrhythmias are problems with the speed or rhythm of the heartbeat. The monitor is a small device applied to your chest. You can wear one while you do your normal daily activities. While wearing this monitor if you have any symptoms to push the button and record what you felt. Once you have worn this monitor for the period of time provider prescribed (Usually 14 days), you will return the monitor device in the postage paid box. Once it is returned they will download the data collected and provide [place] with a report which the provider will then review and we will call you with those results. Important tips:  1. Avoid showering during the first 24  hours of wearing the monitor. 2. Avoid excessive sweating to help maximize wear time. 3. Do not submerge the device, no hot tubs, and no swimming pools. 4. Keep any lotions or oils away from the patch. 5. After 24 hours you may shower with the patch on. Take brief showers with your back facing the shower head.  6. Do not remove patch once it has been placed because that will interrupt data and decrease adhesive wear time. 7. Push the button when you have any symptoms and write down what you were feeling. 8. Once you have completed wearing your monitor, remove and place into box which has postage paid and place in your outgoing mailbox.  9. If for some reason you have misplaced your box then call our office and we can provide another box and/or mail it off for you.           Follow-Up: At [facility], you and your health needs are our priority.  As part of our continuing mission to provide you with exceptional heart care, we have created designated Provider Care Teams.  These Care Teams include your primary Cardiologist (physician) and Advanced Practice Providers (APPs -  Physician Assistants and Nurse Practitioners) who all work together to provide you with the care you need, when you need it.  We recommend signing up for the patient portal called "MyChart".  Sign up information is provided on this After Visit Summary.  MyChart  is used to connect with patients for Virtual Visits (Telemedicine).  Patients are able to view lab/test results, encounter notes, upcoming appointments, etc.  Non-urgent messages can be sent to your provider as well.   To learn more about what you can do with MyChart, go to [email or web address].    Your next appointment:   5 week(s)  The format for your next appointment:   In Person  Provider:   You may see Dr. Saunders Revel or one of the following Advanced Practice Providers on your designated Care Team:    Murray Hodgkins, NP  Christell Faith, [name]  Marrianne Mood, [name]  Cadence Kathlen Mody, [name]  Laurann [place], NP    Other Instructions  Please call our office if you have another event with passing out.

## 2019-12-23 DIAGNOSIS — Z87898 Personal history of other specified conditions: Secondary | ICD-10-CM | POA: Diagnosis not present

## 2019-12-23 LAB — CBC
Hematocrit: 40.6 % (ref 34.0–46.6)
Hemoglobin: 13.7 g/dL (ref 11.1–15.9)
MCH: 31.1 pg (ref 26.6–33.0)
MCHC: 33.7 g/dL (ref 31.5–35.7)
MCV: 92 fL (ref 79–97)
Platelets: 353 10*3/uL (ref 150–450)
RBC: 4.4 x10E6/uL (ref 3.77–5.28)
RDW: 12 % (ref 11.7–15.4)
WBC: 7 10*3/uL (ref 3.4–10.8)

## 2019-12-23 LAB — BASIC METABOLIC PANEL
BUN/Creatinine Ratio: 19 (ref 12–28)
BUN: 20 mg/dL (ref 8–27)
CO2: 24 mmol/L (ref 20–29)
Calcium: 9.6 mg/dL (ref 8.7–10.3)
Chloride: 96 mmol/L (ref 96–106)
Creatinine, Ser: 1.08 mg/dL — ABNORMAL HIGH (ref 0.57–1.00)
GFR calc Af Amer: 53 mL/min/{1.73_m2} — ABNORMAL LOW (ref >59)
GFR calc non Af Amer: 46 mL/min/{1.73_m2} — ABNORMAL LOW (ref >59)
Glucose: 98 mg/dL (ref 65–99)
Potassium: 4.5 mmol/L (ref 3.5–5.2)
Sodium: 135 mmol/L (ref 134–144)

## 2019-12-24 DIAGNOSIS — H9190 Unspecified hearing loss, unspecified ear: Secondary | ICD-10-CM | POA: Diagnosis not present

## 2019-12-24 DIAGNOSIS — E039 Hypothyroidism, unspecified: Secondary | ICD-10-CM | POA: Diagnosis not present

## 2019-12-24 DIAGNOSIS — Z85828 Personal history of other malignant neoplasm of skin: Secondary | ICD-10-CM | POA: Diagnosis not present

## 2019-12-24 DIAGNOSIS — M810 Age-related osteoporosis without current pathological fracture: Secondary | ICD-10-CM | POA: Diagnosis not present

## 2019-12-24 DIAGNOSIS — J309 Allergic rhinitis, unspecified: Secondary | ICD-10-CM | POA: Diagnosis not present

## 2019-12-24 DIAGNOSIS — J449 Chronic obstructive pulmonary disease, unspecified: Secondary | ICD-10-CM | POA: Diagnosis not present

## 2019-12-24 DIAGNOSIS — E538 Deficiency of other specified B group vitamins: Secondary | ICD-10-CM | POA: Diagnosis not present

## 2019-12-24 DIAGNOSIS — R1909 Other intra-abdominal and pelvic swelling, mass and lump: Secondary | ICD-10-CM | POA: Diagnosis not present

## 2019-12-24 DIAGNOSIS — I10 Essential (primary) hypertension: Secondary | ICD-10-CM | POA: Diagnosis not present

## 2019-12-24 DIAGNOSIS — R1310 Dysphagia, unspecified: Secondary | ICD-10-CM | POA: Diagnosis not present

## 2019-12-24 DIAGNOSIS — Z96652 Presence of left artificial knee joint: Secondary | ICD-10-CM | POA: Diagnosis not present

## 2019-12-24 DIAGNOSIS — Z9181 History of falling: Secondary | ICD-10-CM | POA: Diagnosis not present

## 2019-12-24 DIAGNOSIS — Z87891 Personal history of nicotine dependence: Secondary | ICD-10-CM | POA: Diagnosis not present

## 2019-12-24 DIAGNOSIS — C84A0 Cutaneous T-cell lymphoma, unspecified, unspecified site: Secondary | ICD-10-CM | POA: Diagnosis not present

## 2019-12-24 DIAGNOSIS — Z87442 Personal history of urinary calculi: Secondary | ICD-10-CM | POA: Diagnosis not present

## 2019-12-24 DIAGNOSIS — E559 Vitamin D deficiency, unspecified: Secondary | ICD-10-CM | POA: Diagnosis not present

## 2019-12-24 DIAGNOSIS — Z86010 Personal history of colonic polyps: Secondary | ICD-10-CM | POA: Diagnosis not present

## 2019-12-24 DIAGNOSIS — Z87440 Personal history of urinary (tract) infections: Secondary | ICD-10-CM | POA: Diagnosis not present

## 2019-12-24 DIAGNOSIS — M5136 Other intervertebral disc degeneration, lumbar region: Secondary | ICD-10-CM | POA: Diagnosis not present

## 2019-12-24 DIAGNOSIS — M5030 Other cervical disc degeneration, unspecified cervical region: Secondary | ICD-10-CM | POA: Diagnosis not present

## 2019-12-25 DIAGNOSIS — I10 Essential (primary) hypertension: Secondary | ICD-10-CM | POA: Diagnosis not present

## 2019-12-25 DIAGNOSIS — R1310 Dysphagia, unspecified: Secondary | ICD-10-CM | POA: Diagnosis not present

## 2019-12-25 DIAGNOSIS — J309 Allergic rhinitis, unspecified: Secondary | ICD-10-CM | POA: Diagnosis not present

## 2019-12-25 DIAGNOSIS — H9190 Unspecified hearing loss, unspecified ear: Secondary | ICD-10-CM | POA: Diagnosis not present

## 2019-12-25 DIAGNOSIS — C84A0 Cutaneous T-cell lymphoma, unspecified, unspecified site: Secondary | ICD-10-CM | POA: Diagnosis not present

## 2019-12-25 DIAGNOSIS — Z85828 Personal history of other malignant neoplasm of skin: Secondary | ICD-10-CM | POA: Diagnosis not present

## 2019-12-25 DIAGNOSIS — Z96652 Presence of left artificial knee joint: Secondary | ICD-10-CM | POA: Diagnosis not present

## 2019-12-25 DIAGNOSIS — R1909 Other intra-abdominal and pelvic swelling, mass and lump: Secondary | ICD-10-CM | POA: Diagnosis not present

## 2019-12-25 DIAGNOSIS — Z87442 Personal history of urinary calculi: Secondary | ICD-10-CM | POA: Diagnosis not present

## 2019-12-25 DIAGNOSIS — M5136 Other intervertebral disc degeneration, lumbar region: Secondary | ICD-10-CM | POA: Diagnosis not present

## 2019-12-25 DIAGNOSIS — E538 Deficiency of other specified B group vitamins: Secondary | ICD-10-CM | POA: Diagnosis not present

## 2019-12-25 DIAGNOSIS — E559 Vitamin D deficiency, unspecified: Secondary | ICD-10-CM | POA: Diagnosis not present

## 2019-12-25 DIAGNOSIS — Z86010 Personal history of colonic polyps: Secondary | ICD-10-CM | POA: Diagnosis not present

## 2019-12-25 DIAGNOSIS — Z9181 History of falling: Secondary | ICD-10-CM | POA: Diagnosis not present

## 2019-12-25 DIAGNOSIS — E039 Hypothyroidism, unspecified: Secondary | ICD-10-CM | POA: Diagnosis not present

## 2019-12-25 DIAGNOSIS — J449 Chronic obstructive pulmonary disease, unspecified: Secondary | ICD-10-CM | POA: Diagnosis not present

## 2019-12-25 DIAGNOSIS — M810 Age-related osteoporosis without current pathological fracture: Secondary | ICD-10-CM | POA: Diagnosis not present

## 2019-12-25 DIAGNOSIS — Z87440 Personal history of urinary (tract) infections: Secondary | ICD-10-CM | POA: Diagnosis not present

## 2019-12-25 DIAGNOSIS — M5030 Other cervical disc degeneration, unspecified cervical region: Secondary | ICD-10-CM | POA: Diagnosis not present

## 2019-12-25 DIAGNOSIS — Z87891 Personal history of nicotine dependence: Secondary | ICD-10-CM | POA: Diagnosis not present

## 2019-12-27 ENCOUNTER — Telehealth: Payer: Self-pay

## 2019-12-27 DIAGNOSIS — Z87891 Personal history of nicotine dependence: Secondary | ICD-10-CM | POA: Diagnosis not present

## 2019-12-27 DIAGNOSIS — M5136 Other intervertebral disc degeneration, lumbar region: Secondary | ICD-10-CM | POA: Diagnosis not present

## 2019-12-27 DIAGNOSIS — Z87442 Personal history of urinary calculi: Secondary | ICD-10-CM | POA: Diagnosis not present

## 2019-12-27 DIAGNOSIS — R1310 Dysphagia, unspecified: Secondary | ICD-10-CM | POA: Diagnosis not present

## 2019-12-27 DIAGNOSIS — I10 Essential (primary) hypertension: Secondary | ICD-10-CM | POA: Diagnosis not present

## 2019-12-27 DIAGNOSIS — C84A0 Cutaneous T-cell lymphoma, unspecified, unspecified site: Secondary | ICD-10-CM | POA: Diagnosis not present

## 2019-12-27 DIAGNOSIS — H9190 Unspecified hearing loss, unspecified ear: Secondary | ICD-10-CM | POA: Diagnosis not present

## 2019-12-27 DIAGNOSIS — Z86010 Personal history of colonic polyps: Secondary | ICD-10-CM | POA: Diagnosis not present

## 2019-12-27 DIAGNOSIS — Z85828 Personal history of other malignant neoplasm of skin: Secondary | ICD-10-CM | POA: Diagnosis not present

## 2019-12-27 DIAGNOSIS — J449 Chronic obstructive pulmonary disease, unspecified: Secondary | ICD-10-CM | POA: Diagnosis not present

## 2019-12-27 DIAGNOSIS — Z96652 Presence of left artificial knee joint: Secondary | ICD-10-CM | POA: Diagnosis not present

## 2019-12-27 DIAGNOSIS — E039 Hypothyroidism, unspecified: Secondary | ICD-10-CM | POA: Diagnosis not present

## 2019-12-27 DIAGNOSIS — M5030 Other cervical disc degeneration, unspecified cervical region: Secondary | ICD-10-CM | POA: Diagnosis not present

## 2019-12-27 DIAGNOSIS — J309 Allergic rhinitis, unspecified: Secondary | ICD-10-CM | POA: Diagnosis not present

## 2019-12-27 DIAGNOSIS — R1909 Other intra-abdominal and pelvic swelling, mass and lump: Secondary | ICD-10-CM | POA: Diagnosis not present

## 2019-12-27 DIAGNOSIS — E538 Deficiency of other specified B group vitamins: Secondary | ICD-10-CM | POA: Diagnosis not present

## 2019-12-27 DIAGNOSIS — Z87440 Personal history of urinary (tract) infections: Secondary | ICD-10-CM | POA: Diagnosis not present

## 2019-12-27 DIAGNOSIS — Z9181 History of falling: Secondary | ICD-10-CM | POA: Diagnosis not present

## 2019-12-27 DIAGNOSIS — M810 Age-related osteoporosis without current pathological fracture: Secondary | ICD-10-CM | POA: Diagnosis not present

## 2019-12-27 DIAGNOSIS — E559 Vitamin D deficiency, unspecified: Secondary | ICD-10-CM | POA: Diagnosis not present

## 2019-12-27 NOTE — Telephone Encounter (Signed)
-----   Message from Monica Bush, MD sent at 12/24/2019 10:12 AM EST ----- No evidence of DVT.  Ongoing management of leg swelling and syncope per plan discussed with [name] on Wednesday.

## 2019-12-27 NOTE — Telephone Encounter (Signed)
Called pt and spoke with pt's daughter Monica Singh, okay to discuss pt's heath information with daughter based on pt's DPR. Results of recent venous US as reviewed by End, MD   "No evidence of DVT.  Ongoing management of leg swelling and syncope per plan discussed with [name] last  Wednesday 11/24". Monica Singh verbalized understanding and no questions or concerns at this time. Monica Singh also advised that pt continues to wear live-Zio patch w/o complications.

## 2019-12-28 DIAGNOSIS — Z96651 Presence of right artificial knee joint: Secondary | ICD-10-CM | POA: Diagnosis not present

## 2019-12-28 DIAGNOSIS — Z96652 Presence of left artificial knee joint: Secondary | ICD-10-CM | POA: Diagnosis not present

## 2019-12-28 DIAGNOSIS — Z96653 Presence of artificial knee joint, bilateral: Secondary | ICD-10-CM | POA: Diagnosis not present

## 2020-01-03 DIAGNOSIS — R1310 Dysphagia, unspecified: Secondary | ICD-10-CM

## 2020-01-03 DIAGNOSIS — R1909 Other intra-abdominal and pelvic swelling, mass and lump: Secondary | ICD-10-CM | POA: Diagnosis not present

## 2020-01-03 DIAGNOSIS — J449 Chronic obstructive pulmonary disease, unspecified: Secondary | ICD-10-CM | POA: Diagnosis not present

## 2020-01-03 DIAGNOSIS — M5136 Other intervertebral disc degeneration, lumbar region: Secondary | ICD-10-CM

## 2020-01-03 DIAGNOSIS — Z87440 Personal history of urinary (tract) infections: Secondary | ICD-10-CM | POA: Diagnosis not present

## 2020-01-03 DIAGNOSIS — J309 Allergic rhinitis, unspecified: Secondary | ICD-10-CM

## 2020-01-03 DIAGNOSIS — C84A0 Cutaneous T-cell lymphoma, unspecified, unspecified site: Secondary | ICD-10-CM | POA: Diagnosis not present

## 2020-01-03 DIAGNOSIS — M810 Age-related osteoporosis without current pathological fracture: Secondary | ICD-10-CM | POA: Diagnosis not present

## 2020-01-03 DIAGNOSIS — I10 Essential (primary) hypertension: Secondary | ICD-10-CM

## 2020-01-03 DIAGNOSIS — M5030 Other cervical disc degeneration, unspecified cervical region: Secondary | ICD-10-CM

## 2020-01-05 DIAGNOSIS — R55 Syncope and collapse: Secondary | ICD-10-CM | POA: Diagnosis not present

## 2020-02-04 ENCOUNTER — Ambulatory Visit: Payer: Medicare Other | Admitting: Physician Assistant

## 2020-02-17 ENCOUNTER — Ambulatory Visit: Payer: Medicare Other | Admitting: Physician Assistant

## 2020-02-22 ENCOUNTER — Telehealth: Payer: Self-pay | Admitting: Physician Assistant

## 2020-02-22 NOTE — Progress Notes (Unsigned)
Virtual Visit via Video Note   This visit type was conducted due to national recommendations for restrictions regarding the COVID-19 Pandemic (e.g. social distancing) in an effort to limit this patient's exposure and mitigate transmission in our community.  Due to her co-morbid illnesses, this patient is at least at moderate risk for complications without adequate follow up.  This format is felt to be most appropriate for this patient at this time.  All issues noted in this document were discussed and addressed.  A limited physical exam was performed with this format.  Please refer to the patient's chart for her consent to telehealth for [facility].       Date:  02/24/2020   ID:  Monica Singh, DOB [birth year], MRN [id number] The patient was identified using 2 identifiers.  Patient Location: Home Provider Location: Office/Clinic  PCP:  Monica Sons, MD  Cardiologist:  Nelva Bush, MD  Electrophysiologist:  None   Evaluation Performed:  Follow-Up Visit  Chief Complaint:  Follow up  History of Present Illness:    Monica Singh is a 87 y.o. 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  She was admitted to [facility] twice in 11/2019 with altered mental status with associated syncope.  Troponins were negative during each admission.  CT head and MRI brain/MRA head showed no acute intracranial process with age-related cerebral atrophy and mild to moderate chronic small vessel ischemic disease.  Echo showed an EF of 55 to 60%, no regional wall motion abnormalities, normal LV diastolic function parameters, normal RV systolic function and ventricular cavity size, mildly dilated left atrium,  degenerative mitral valve with mild to moderate regurgitation, mild to moderate tricuspid regurgitation, and trivial aortic insufficiency.  Carotid artery ultrasound showed less than 50% bilateral ICA stenoses with patent vertebral arteries with antegrade flow.  She established with cardiology on 12/22/2019 following her hospitalization and reported 3 recent episodes of syncope dating back to 09/2019.  At her visit she was noted to have some left lower extremity swelling with ultrasound being negative for DVT.  Outpatient cardiac monitoring showed a predominant rhythm of sinus with an average rate of 90 bpm (range 68 to 134 bpm in sinus), 7 beat run of NSVT occurred with a maximum rate of 203 bpm, 3 atrial runs occurred with the longest lasting up to 14 beats with a maximal rate of 203 bpm, rare PACs and PVCs, no sustained arrhythmias or prolonged pauses, and no patient triggered events.  She is seen virtually through video visit today with the assistance of her daughter, [name].  Since she was last seen she has done well.  No further syncopal episodes.  She denies any chest pain, dyspnea, dizziness, or presyncope.  Patient's daughter does indicate the patient has an inhaler which she uses every morning which helps with some underlying mild shortness of breath.  She has been working to increase her water intake and is also taking vitamin B and D supplements.  In talking with the patient's daughter all 3 of these syncopal episodes occurred shortly after eating a meal.  There was no seizure-like activity associated with these episodes.  Her witnessed episodes of LOC lasted approximately 1 minute.  There was one episode that was unwitnessed at which time it  is believed she was on the ground for an undetermined amount of time.  She does have a life alert button though forgot to push this.  She was found on the ground trying to get up by the sofa by her daughter who had arrived to transport the patient to a urology  appointment.  She currently feels well and is without complaints.  Labs independently reviewed by myself: 11/2019 - HGB 13.7, PLT 353, BUN 20, SCr 1.08, potassium 4.5, TC 175, TG 53, HDL 73, LDL 91, A1c 5.1, magnesium 2.2, albumin 4.1, AST/ALT normal, TSH normal  The patient does not have symptoms concerning for COVID-19 infection (fever, chills, cough, or Monica shortness of breath).    Past Medical History:  Diagnosis Date  . Actinic keratosis   . Adnexal mass 06/03/2014   since 2009  . Arthritis   . Complication of anesthesia   . COPD (chronic obstructive pulmonary disease) (HCC)   . 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) 01/2007   NON-HODGKINS-stage 3, cd 20 positive; status post 6 cycles of R-CHOP  . Dyspnea    with exertion  . GERD (gastroesophageal reflux disease)   . Herpes zoster without complication 01/31/2009  . History of hiatal hernia   . History of kidney stones   . HOH (hard of hearing)    wears hearing aides  . Non Hodgkin's lymphoma (HCC)   . Personal history of chemotherapy   . PONV (postoperative nausea and vomiting)    in January lasted about 4 hours  . Recurrent UTI   . Squamous cell carcinoma of skin 11/09/2008   Right post. lat. elbow. SCCis arising in [place]. Excised 01/04/2009, margins free.    Past Surgical History:  Procedure Laterality Date  . ABDOMINAL SURGERY  2009   abdominal mass+ NH lymphoma,  . BACK SURGERY  01/2017   fusion. metal plate in neck at back  . CATARACT EXTRACTION  1997   right eye and left ey  . cervical neck fusion  1995  . CYSTOSCOPY W/ RETROGRADES Left 01/30/2018   Procedure: CYSTOSCOPY WITH RETROGRADE PYELOGRAM;  Surgeon: Sondra Come, MD;  Location: [facility];  Service: Urology;  Laterality: Left;  . CYSTOSCOPY W/ RETROGRADES Left 08/05/2018   Procedure: CYSTOSCOPY WITH RETROGRADE PYELOGRAM;  Surgeon: Sondra Come, MD;  Location: [facility];  Service: Urology;  Laterality: Left;  . CYSTOSCOPY W/ URETERAL STENT PLACEMENT  Left 08/05/2018   Procedure: CYSTOSCOPY WITH STENT Exchange;  Surgeon: Sondra Come, MD;  Location: [facility];  Service: Urology;  Laterality: Left;  . CYSTOSCOPY W/ URETERAL STENT PLACEMENT Left 05/21/2019   Procedure: CYSTOSCOPY WITH STENT REPLACEMENT;  Surgeon: Sondra Come, MD;  Location: [facility];  Service: Urology;  Laterality: Left;  . CYSTOSCOPY WITH BIOPSY Left 02/27/2018   Procedure: CYSTOSCOPY WITH BIOPSY;  Surgeon: Sondra Come, MD;  Location: [facility];  Service: Urology;  Laterality: Left;  . CYSTOSCOPY WITH STENT PLACEMENT Left 01/30/2018   Procedure: CYSTOSCOPY WITH STENT PLACEMENT;  Surgeon: Sondra Come, MD;  Location: [facility];  Service: Urology;  Laterality: Left;  . CYSTOSCOPY WITH URETEROSCOPY AND STENT PLACEMENT Left 02/27/2018   Procedure: CYSTOSCOPY WITH URETEROSCOPY AND STENT Exchange;  Surgeon: Sondra Come, MD;  Location: [facility];  Service: Urology;  Laterality: Left;  . DENTAL SURGERY     screws  . JOINT REPLACEMENT Left    knee  . KNEE ARTHROPLASTY Right 12/23/2018   Procedure: RIGHT COMPUTER ASSISTED TOTAL KNEE ARTHROPLASTY;  Surgeon: Dereck Leep, MD;  Location: [facility];  Service: Orthopedics;  Laterality: Right;  . KYPHOPLASTY N/A 02/21/2017   Procedure: [id number];  Surgeon: Hessie Knows, MD;  Location: [facility];  Service: Orthopedics;  Laterality: N/A;  . laparotomy with biopsy  03/02/2007  . PORTACATH PLACEMENT  2009  . [place]  . SQUAMOUS CELL CARCINOMA EXCISION     right arm  . TOTAL KNEE ARTHROPLASTY Left    2009  . URETEROSCOPY Left 01/30/2018   Procedure: URETEROSCOPY;  Surgeon: Billey Co, MD;  Location: [facility];  Service: Urology;  Laterality: Left;  Marland Kitchen VAGINAL HYSTERECTOMY  1971     Current Meds  Medication Sig  . acetaminophen (TYLENOL) 500 MG tablet Take 1,000 mg by mouth every 6 (six) hours as needed for moderate pain.   . COMBIVENT RESPIMAT 20-100 MCG/ACT AERS respimat INHALE 2 PUFFS INTO THE LUNGS 3  TIMES DAILY  . folic acid (FOLVITE) 1 MG tablet Take 1 tablet (1 mg total) by mouth daily.  . rosuvastatin (CRESTOR) 5 MG tablet Take 1 tablet (5 mg total) by mouth daily.  . vitamin B-12 1000 MCG tablet Take 1 tablet (1,000 mcg total) by mouth daily.  . Vitamin D, Ergocalciferol, (DRISDOL) 1.25 MG (50000 UNIT) CAPS capsule Take 1 capsule (50,000 Units total) by mouth every 7 (seven) days.     Allergies:   Milk-related compounds, Lac bovis, and Sulfa antibiotics   Social History   Tobacco Use  . Smoking status: Former Smoker    Packs/day: 0.25    Types: Cigarettes    Quit date: 06/21/1989    Years since quitting: 30.6  . Smokeless tobacco: Never Used  Vaping Use  . Vaping Use: Never used  Substance Use Topics  . Alcohol use: Yes    Alcohol/week: 14.0 standard drinks    Types: 14 Glasses of wine per week    Comment: 1 glass a night  . Drug use: No     Family Hx: The patient's family history includes Breast cancer in her sister; CAD in her brother; Cataracts in her sister; Dementia in her sister; Heart attack in her brother and brother; Leukemia in her grandchild. There is no history of Kidney disease or Bladder Cancer.  ROS:   Please see the history of present illness.     All other systems reviewed and are negative.   Prior CV studies:   The following studies were reviewed today:  2D echo 11/2019: 1. Left ventricular ejection fraction, by estimation, is 55 to 60%. The  left ventricle has normal function. The left ventricle has no regional  wall motion abnormalities. Left ventricular diastolic parameters were  normal.  2. Right ventricular systolic function is normal. The right ventricular  size is normal.  3. Left atrial size was mildly dilated.  4. The mitral valve is degenerative. Mild to moderate mitral valve  regurgitation.  5. Tricuspid valve regurgitation is mild to moderate.  6. The aortic valve is normal in structure. Aortic valve regurgitation is   trivial.  __________  Carotid artery ultrasounds 11/2019: IMPRESSION: 1. Atherosclerotic plaque involving the bilateral carotid arteries. Plaque is most prominent in the proximal left internal carotid artery. Estimated degree of stenosis in the internal carotid arteries is less than 50% bilaterally. 2. Patent vertebral arteries with antegrade flow. __________   Elwyn Reach patch 11/2019: The patient was monitored for 14 days.  The predominant rhythm was sinus with an average rate of 90 bpm (range 68-134 bpm  in sinus).  There were rare PAC's and PVC's.  A single 7 beat run of nonsustained ventricular tachycardia occurred, with a maximum rate of 203 bpm.  There were 3 atrial runs lasting up to 14 beats with a maximum rate of 203 bpm.  No sustained arrhythmia or prolonged pause occurred.  There were no patient triggered symptoms.   Predominantly sinus rhythm with rare PAC's and PVC's, as well as rare episodes of brief NSVT and PSVT.   Labs/Other Tests and Data Reviewed:    EKG:  An ECG dated 12/22/2019 was personally reviewed today and demonstrated:  NSR, 97 bpm, left axis deviation, LVH, poor R wave progression along the precordial leads, no acute ST-T changes  Recent Labs: 12/16/2019: B Natriuretic Peptide 62.7; TSH 3.561 12/19/2019: ALT 13; Magnesium 2.2 12/22/2019: BUN 20; Creatinine, Ser 1.08; Hemoglobin 13.7; Platelets 353; Potassium 4.5; Sodium 135   Recent Lipid Panel Lab Results  Component Value Date/Time   CHOL 175 12/20/2019 03:40 AM   TRIG 53 12/20/2019 03:40 AM   HDL 73 12/20/2019 03:40 AM   CHOLHDL 2.4 12/20/2019 03:40 AM   LDLCALC 91 12/20/2019 03:40 AM    Wt Readings from Last 3 Encounters:  02/24/20 140 lb (63.5 kg)  12/22/19 140 lb 8 oz (63.7 kg)  12/19/19 140 lb (63.5 kg)     Risk Assessment/Calculations:      Objective:    Vital Signs:  Ht 5\' 4"  (1.626 m)   Wt 140 lb (63.5 kg)   BMI 24.03 kg/m    VITAL SIGNS:  reviewed GEN:  no acute  distress EYES:  sclerae anicteric, EOMI - Extraocular Movements Intact  ASSESSMENT & PLAN:    1. Syncope: No further episodes since she was last seen.  Cardiac work-up thus far has included an echo, carotid artery ultrasound, and Zio patch as outlined above, all without obvious explanation for potential cardiac etiology.  Lexiscan MPI has been deferred by patient and family which I feel is reasonable as well.  Continue adequate hydration with slow positional changes.  No plans for further cardiac evaluation at this time.  Discussed with DOD.  2. Mitral regurgitation: Mild to moderate by echo in 11/2019.  Incidentally noted.  No further work-up indicated at this time.  3. Carotid artery stenosis: Nonobstructive by most recent ultrasound as outlined above.  Recently started on low-dose Crestor.  Recommend obtaining fasting lipid panel and LFT when she is seen in person follow-up.    COVID-19 Education: The signs and symptoms of COVID-19 were discussed with the patient and how to seek care for testing (follow up with PCP or arrange E-visit).  The importance of social distancing was discussed today.  Time:   Today, I have spent 15 minutes with the patient with telehealth technology discussing the above problems.     Medication Adjustments/Labs and Tests Ordered: Current medicines are reviewed at length with the patient today.  Concerns regarding medicines are outlined above.   Tests Ordered: No orders of the defined types were placed in this encounter.   Medication Changes: No orders of the defined types were placed in this encounter.   Follow Up:  In Person in 2 month(s) Appointment must be with an MD to establish care.   Signed, Christell Faith, [name]  02/24/2020 12:24 PM    [facility]

## 2020-02-22 NOTE — Telephone Encounter (Signed)
°  Patient Consent for Virtual Visit         [name] has provided verbal consent on 02/22/2020 for a virtual visit (video or telephone).   CONSENT FOR VIRTUAL VISIT FOR:  Monica Singh  By participating in this virtual visit I agree to the following:  I hereby voluntarily request, consent and authorize [place] and its employed or contracted physicians, Engineer, materials, nurse practitioners or other licensed health care professionals (the Practitioner), to provide me with telemedicine health care services (the Services") as deemed necessary by the treating Practitioner. I acknowledge and consent to receive the Services by the Practitioner via telemedicine. I understand that the telemedicine visit will involve communicating with the Practitioner through live audiovisual communication technology and the disclosure of certain medical information by electronic transmission. I acknowledge that I have been given the opportunity to request an in-person assessment or other available alternative prior to the telemedicine visit and am voluntarily participating in the telemedicine visit.  I understand that I have the right to withhold or withdraw my consent to the use of telemedicine in the course of my care at any time, without affecting my right to future care or treatment, and that the Practitioner or I may terminate the telemedicine visit at any time. I understand that I have the right to inspect all information obtained and/or recorded in the course of the telemedicine visit and may receive copies of available information for a reasonable fee.  I understand that some of the potential risks of receiving the Services via telemedicine include:   Delay or interruption in medical evaluation due to technological equipment failure or disruption;  Information transmitted may not be sufficient (e.g. poor resolution of images) to allow for appropriate medical decision making by the  Practitioner; and/or   In rare instances, security protocols could fail, causing a breach of personal health information.  Furthermore, I acknowledge that it is my responsibility to provide information about my medical history, conditions and care that is complete and accurate to the best of my ability. I acknowledge that Practitioner's advice, recommendations, and/or decision may be based on factors not within their control, such as incomplete or inaccurate data provided by me or distortions of diagnostic images or specimens that may result from electronic transmissions. I understand that the practice of medicine is not an exact science and that Practitioner makes no warranties or guarantees regarding treatment outcomes. I acknowledge that a copy of this consent can be made available to me via my patient portal ([place]), or I can request a printed copy by calling the office of [place].    I understand that my insurance will be billed for this visit.   I have read or had this consent read to me.  I understand the contents of this consent, which adequately explains the benefits and risks of the Services being provided via telemedicine.   I have been provided ample opportunity to ask questions regarding this consent and the Services and have had my questions answered to my satisfaction.  I give my informed consent for the services to be provided through the use of telemedicine in my medical care

## 2020-02-24 ENCOUNTER — Encounter: Payer: Self-pay | Admitting: Physician Assistant

## 2020-02-24 ENCOUNTER — Other Ambulatory Visit: Payer: Self-pay

## 2020-02-24 ENCOUNTER — Telehealth (INDEPENDENT_AMBULATORY_CARE_PROVIDER_SITE_OTHER): Payer: Medicare Other | Admitting: Physician Assistant

## 2020-02-24 VITALS — Ht 64.0 in | Wt 140.0 lb

## 2020-02-24 DIAGNOSIS — I6523 Occlusion and stenosis of bilateral carotid arteries: Secondary | ICD-10-CM

## 2020-02-24 DIAGNOSIS — R55 Syncope and collapse: Secondary | ICD-10-CM

## 2020-02-24 DIAGNOSIS — I340 Nonrheumatic mitral (valve) insufficiency: Secondary | ICD-10-CM

## 2020-02-24 NOTE — Patient Instructions (Signed)
Medication Instructions:  No changes  *If you need a refill on your cardiac medications before your next appointment, please call your pharmacy*   Lab Work: None  If you have labs (blood work) drawn today and your tests are completely normal, you will receive your results only by: Marland Kitchen MyChart Message (if you have MyChart) OR . A paper copy in the mail If you have any lab test that is abnormal or we need to change your treatment, we will call you to review the results.   Testing/Procedures: None   Follow-Up: At [facility], you and your health needs are our priority.  As part of our continuing mission to provide you with exceptional heart care, we have created designated Provider Care Teams.  These Care Teams include your primary Cardiologist (physician) and Advanced Practice Providers (APPs -  Physician Assistants and Nurse Practitioners) who all work together to provide you with the care you need, when you need it.   Your next appointment:   2 month(s)  The format for your next appointment:   In Person  Provider:   Nelva Bush, MD

## 2020-03-17 DIAGNOSIS — R309 Painful micturition, unspecified: Secondary | ICD-10-CM | POA: Diagnosis not present

## 2020-03-17 DIAGNOSIS — N390 Urinary tract infection, site not specified: Secondary | ICD-10-CM | POA: Diagnosis not present

## 2020-04-04 ENCOUNTER — Ambulatory Visit: Payer: Medicare Other | Admitting: Urology

## 2020-04-04 ENCOUNTER — Other Ambulatory Visit: Payer: Self-pay | Admitting: *Deleted

## 2020-04-04 ENCOUNTER — Other Ambulatory Visit: Payer: Self-pay

## 2020-04-04 ENCOUNTER — Encounter: Payer: Self-pay | Admitting: Urology

## 2020-04-04 ENCOUNTER — Other Ambulatory Visit
Admission: RE | Admit: 2020-04-04 | Discharge: 2020-04-04 | Disposition: A | Discharge status: Home / Self Care | Payer: Medicare Other | Attending: Urology | Admitting: Urology

## 2020-04-04 VITALS — BP 152/79 | HR 91 | Ht 63.0 in | Wt 142.0 lb

## 2020-04-04 DIAGNOSIS — N390 Urinary tract infection, site not specified: Secondary | ICD-10-CM

## 2020-04-04 DIAGNOSIS — N135 Crossing vessel and stricture of ureter without hydronephrosis: Secondary | ICD-10-CM | POA: Diagnosis not present

## 2020-04-04 LAB — URINALYSIS, COMPLETE (UACMP) WITH MICROSCOPIC
Bilirubin Urine: NEGATIVE
Glucose, UA: NEGATIVE mg/dL
Nitrite: NEGATIVE
Protein, ur: 30 mg/dL — AB
Specific Gravity, Urine: 1.015 (ref 1.005–1.030)
pH: 7.5 (ref 5.0–8.0)

## 2020-04-04 NOTE — Progress Notes (Signed)
   04/04/2020 12:32 PM   [place] 03/24/1928 [id number]  Reason for visit: Follow up left UPJ obstruction, recurrent UTIs  HPI: Monica Singh is an 87 yo F with left chronic UPJ obstruction managed with an indwelling chronic ureteral stent. Briefly, she is a very nice 87 year old female who originally presented in the fall 2019 with recurrent urinary tract infections, severe left-sided flank pain, and was ultimately found to have new onset of left idiopathic UPJ obstruction with moderate to severe upstream hydronephrosis. Diagnostic ureteroscopy in January 2020 did not show any concerning lesions and cytology was negative. She underwent stent placement at that time with complete resolution of her left-sided pain. She has had approximately 3-4 UTIs per year with the stent in place, and the stent was last changed in 04/2019.  Her UTI symptoms typically are confusion and cloudy urine.  We again reviewed the differences between asymptomatic bacteriuria and true UTI, but with her age and baseline dementia it can be challenging.  Overall, she feels she is doing much better with the stent in place compared to without the stent when she was having severe left-sided flank pain.  She denies any urinary symptoms of urgency, frequency, or flank pain.  Urine sent for culture today in anticipation of upcoming stent change.  We discussed the need for ongoing stent changes on a yearly basis.  Risks and benefits discussed.  We will plan to perform under light sedation, she has had trouble with persistent confusion after general anesthesia in the past.  Schedule cystoscopy and left ureteral stent change  [place], [place] [address] [place], [place] [address] [phone]

## 2020-04-04 NOTE — Patient Instructions (Signed)
Denny Peon will call to schedule stent change.

## 2020-04-05 ENCOUNTER — Other Ambulatory Visit: Payer: Self-pay | Admitting: Urology

## 2020-04-06 LAB — URINE CULTURE: Culture: 80000 — AB

## 2020-04-18 ENCOUNTER — Other Ambulatory Visit: Payer: Self-pay

## 2020-04-18 DIAGNOSIS — N390 Urinary tract infection, site not specified: Secondary | ICD-10-CM

## 2020-04-20 DIAGNOSIS — M25552 Pain in left hip: Secondary | ICD-10-CM | POA: Diagnosis not present

## 2020-04-20 DIAGNOSIS — M533 Sacrococcygeal disorders, not elsewhere classified: Secondary | ICD-10-CM | POA: Diagnosis not present

## 2020-04-21 ENCOUNTER — Other Ambulatory Visit: Payer: Self-pay

## 2020-04-21 ENCOUNTER — Other Ambulatory Visit: Payer: Medicare Other

## 2020-04-21 DIAGNOSIS — N390 Urinary tract infection, site not specified: Secondary | ICD-10-CM

## 2020-04-24 ENCOUNTER — Ambulatory Visit: Payer: Medicare Other | Admitting: Dermatology

## 2020-04-24 ENCOUNTER — Other Ambulatory Visit: Payer: Self-pay

## 2020-04-24 ENCOUNTER — Encounter: Payer: Self-pay | Admitting: Dermatology

## 2020-04-24 DIAGNOSIS — Z1283 Encounter for screening for malignant neoplasm of skin: Secondary | ICD-10-CM

## 2020-04-24 DIAGNOSIS — D1800 Hemangioma unspecified site: Secondary | ICD-10-CM | POA: Diagnosis not present

## 2020-04-24 DIAGNOSIS — L814 Other melanin hyperpigmentation: Secondary | ICD-10-CM

## 2020-04-24 DIAGNOSIS — Z85828 Personal history of other malignant neoplasm of skin: Secondary | ICD-10-CM | POA: Diagnosis not present

## 2020-04-24 DIAGNOSIS — L578 Other skin changes due to chronic exposure to nonionizing radiation: Secondary | ICD-10-CM | POA: Diagnosis not present

## 2020-04-24 DIAGNOSIS — L570 Actinic keratosis: Secondary | ICD-10-CM

## 2020-04-24 DIAGNOSIS — D229 Melanocytic nevi, unspecified: Secondary | ICD-10-CM

## 2020-04-24 DIAGNOSIS — L821 Other seborrheic keratosis: Secondary | ICD-10-CM | POA: Diagnosis not present

## 2020-04-24 DIAGNOSIS — L820 Inflamed seborrheic keratosis: Secondary | ICD-10-CM

## 2020-04-24 NOTE — Patient Instructions (Signed)

## 2020-04-24 NOTE — Progress Notes (Unsigned)
Follow-Up Visit   Subjective  Monica Singh is a 87 y.o. female who presents for the following: Annual Exam (Hx SCC - R post lat elbow ). The patient presents for Total-Body Skin Exam (TBSE) for skin cancer screening and mole check.  The following portions of the chart were reviewed this encounter and updated as appropriate:   Tobacco  Allergies  Meds  Problems  Med Hx  Surg Hx  Fam Hx     Review of Systems:  No other skin or systemic complaints except as noted in HPI or Assessment and Plan.  Objective  Well appearing patient in no apparent distress; mood and affect are within normal limits.  A full examination was performed including scalp, head, eyes, ears, nose, lips, neck, chest, axillae, abdomen, back, buttocks, bilateral upper extremities, bilateral lower extremities, hands, feet, fingers, toes, fingernails, and toenails. All findings within normal limits unless otherwise noted below.  Objective  Arms and hands x 24 (24): Erythematous thin papules/macules with gritty scale.   Objective  Forehead x 2, R elbow x 2 (4): Erythematous keratotic or waxy stuck-on papule or plaque.   Assessment & Plan  AK (actinic keratosis) (24) Arms and hands x 24  Destruction of lesion - Arms and hands x 24 Complexity: simple   Destruction method: cryotherapy   Informed consent: discussed and consent obtained   Timeout:  patient name, date of birth, surgical site, and procedure verified Lesion destroyed using liquid nitrogen: Yes   Region frozen until ice ball extended beyond lesion: Yes   Outcome: patient tolerated procedure well with no complications   Post-procedure details: wound care instructions given    Inflamed seborrheic keratosis (4) Forehead x 2, R elbow x 2  Destruction of lesion - Forehead x 2, R elbow x 2 Complexity: simple   Destruction method: cryotherapy   Informed consent: discussed and consent obtained   Timeout:  patient name, date of birth, surgical  site, and procedure verified Lesion destroyed using liquid nitrogen: Yes   Region frozen until ice ball extended beyond lesion: Yes   Outcome: patient tolerated procedure well with no complications   Post-procedure details: wound care instructions given     History of Squamous Cell Carcinoma of the Skin - R post lat elbow  - No evidence of recurrence today - No lymphadenopathy - Recommend regular full body skin exams - Recommend daily broad spectrum sunscreen SPF 30+ to sun-exposed areas, reapply every 2 hours as needed.  - Call if any new or changing lesions are noted between office visits  Lentigines - Scattered tan macules - Due to sun exposure - Benign-appering, observe - Recommend daily broad spectrum sunscreen SPF 30+ to sun-exposed areas, reapply every 2 hours as needed. - Call for any changes  Seborrheic Keratoses - Stuck-on, waxy, tan-brown papules and/or plaques  - Benign-appearing - Discussed benign etiology and prognosis. - Observe - Call for any changes  Melanocytic Nevi - Tan-brown and/or pink-flesh-colored symmetric macules and papules - Benign appearing on exam today - Observation - Call clinic for new or changing moles - Recommend daily use of broad spectrum spf 30+ sunscreen to sun-exposed areas.   Hemangiomas - Red papules - Discussed benign nature - Observe - Call for any changes  Actinic Damage - Chronic condition, secondary to cumulative UV/sun exposure - diffuse scaly erythematous macules with underlying dyspigmentation - Recommend daily broad spectrum sunscreen SPF 30+ to sun-exposed areas, reapply every 2 hours as needed.  - Staying in the shade  or wearing long sleeves, sun glasses (UVA+UVB protection) and wide brim hats (4-inch brim around the entire circumference of the hat) are also recommended for sun protection.  - Call for new or changing lesions.  Skin cancer screening performed today.  Return in about 1 year (around 04/24/2021) for  TBSE.  Monica Singh, CMA, am acting as scribe for Sarina Ser, MD .  Documentation: I have reviewed the above documentation for accuracy and completeness, and I agree with the above.  Sarina Ser, MD

## 2020-04-25 ENCOUNTER — Encounter
Admission: RE | Admit: 2020-04-25 | Discharge: 2020-04-25 | Disposition: A | Discharge status: Home / Self Care | Payer: Medicare Other | Source: Ambulatory Visit | Attending: Urology | Admitting: Urology

## 2020-04-25 HISTORY — DX: Chronic kidney disease, unspecified: N18.9

## 2020-04-25 NOTE — Patient Instructions (Signed)
Your procedure is scheduled on: Friday 05/05/20.  Report to THE FIRST FLOOR REGISTRATION DESK IN THE MEDICAL MALL ON THE MORNING OF SURGERY FIRST, THEN YOU WILL CHECK IN AT THE SURGERY INFORMATION DESK LOCATED OUTSIDE THE SAME DAY SURGERY DEPARTMENT LOCATED ON 2ND FLOOR MEDICAL MALL ENTRANCE.  To find out your arrival time please call [phone] between 1PM - 3PM on Thursday 05/04/20.   Remember: Instructions that are not followed completely may result in serious medical risk, up to and including death, or upon the discretion of your surgeon and anesthesiologist your surgery may need to be rescheduled.     __X__ 1. No food or drink after midnight the night before your procedure.                 No gum chewing or hard candies.   __X__2.  On the morning of surgery brush your teeth with toothpaste and water, you may rinse your mouth with mouthwash if you wish.  Do not swallow any toothpaste or mouthwash.    __X__ 3.  No Alcohol for 24 hours before or after surgery.  __X__ 4.  Do Not Smoke or use e-cigarettes For 24 Hours Prior to Your Surgery.                 Do not use any chewable tobacco products for at least 6 hours prior to                 surgery.  __X__5.  Notify your doctor if there is any change in your medical condition      (cold, fever, infections).      Do NOT wear jewelry, make-up, hairpins, clips or nail polish. Do NOT wear lotions, powders, or perfumes.  Do NOT shave 48 hours prior to surgery. Men may shave face and neck. Do NOT bring valuables to the hospital.     [facility] is not responsible for any belongings or valuables.   Contacts, dentures/partials or body piercings may not be worn into surgery. Bring a case for your contacts, glasses or hearing aids, a denture cup will be supplied.    Patients discharged the day of surgery will not be allowed to drive home.     __X__ Take these medicines the morning of surgery with A SIP OF WATER:     1. COMBIVENT  RESPIMAT 20-100 MCG/ACT AERS respimat     __X__ Use inhalers on the day of surgery. Also bring the inhaler with you to the hospital on the morning of surgery.  __X__ Stop Anti-inflammatories 7 days before surgery such as Advil, Ibuprofen, Motrin, BC or Goodies Powder, Naprosyn, Naproxen, Aleve, Aspirin, Meloxicam. May take Tylenol if needed for pain or discomfort.   __X__Do not start taking any new herbal supplements or vitamins prior to your procedure.  __X__ Stop the following herbal supplements or vitamins:   Wear comfortable clothing (specific to your surgery type) to the hospital.  Plan for stool softeners for home use; pain medications have a tendency to cause constipation. You can also help prevent constipation by eating foods high in fiber such as fruits and vegetables and drinking plenty of fluids as your diet allows.  After surgery, you can prevent lung complications by doing breathing exercises.Take deep breaths and cough every 1-2 hours. Your doctor may order a device called an Incentive Spirometer to help you take deep breaths.  Please call the [place] Department at [phone] if you have any questions about these instructions.

## 2020-04-26 ENCOUNTER — Encounter: Payer: Self-pay | Admitting: Dermatology

## 2020-04-27 LAB — CULTURE, URINE COMPREHENSIVE

## 2020-05-02 ENCOUNTER — Telehealth: Payer: Self-pay

## 2020-05-02 DIAGNOSIS — N390 Urinary tract infection, site not specified: Secondary | ICD-10-CM

## 2020-05-02 MED ORDER — NITROFURANTOIN MONOHYD MACRO 100 MG PO CAPS: 100.0000 mg | ORAL_CAPSULE | Freq: Two times a day (BID) | ORAL | 0 refills | Status: AC

## 2020-05-02 NOTE — Telephone Encounter (Signed)
Called pt's daughter per DPR, informed her of the information below. Daughter gave verbal understanding. RX sent.

## 2020-05-02 NOTE — Telephone Encounter (Signed)
-----   Message from Billey Co, MD sent at 05/02/2020  8:17 AM EDT ----- Arloa Koh do nitrofurantoin 100mg  BID x 3 days to sterilize urine before stent change Friday, thanks  Nickolas Madrid, MD 05/02/2020

## 2020-05-03 ENCOUNTER — Other Ambulatory Visit
Admission: RE | Admit: 2020-05-03 | Discharge: 2020-05-03 | Disposition: A | Discharge status: Home / Self Care | Payer: Medicare Other | Source: Ambulatory Visit | Attending: Urology | Admitting: Urology

## 2020-05-03 ENCOUNTER — Other Ambulatory Visit: Payer: Self-pay

## 2020-05-03 DIAGNOSIS — Z01812 Encounter for preprocedural laboratory examination: Secondary | ICD-10-CM | POA: Insufficient documentation

## 2020-05-03 DIAGNOSIS — Z20822 Contact with and (suspected) exposure to covid-19: Secondary | ICD-10-CM | POA: Diagnosis not present

## 2020-05-03 LAB — CBC
HCT: 41.9 % (ref 36.0–46.0)
Hemoglobin: 13.9 g/dL (ref 12.0–15.0)
MCH: 31.7 pg (ref 26.0–34.0)
MCHC: 33.2 g/dL (ref 30.0–36.0)
MCV: 95.4 fL (ref 80.0–100.0)
Platelets: 311 10*3/uL (ref 150–400)
RBC: 4.39 MIL/uL (ref 3.87–5.11)
RDW: 13 % (ref 11.5–15.5)
WBC: 6.6 10*3/uL (ref 4.0–10.5)
nRBC: 0 % (ref 0.0–0.2)

## 2020-05-03 LAB — BASIC METABOLIC PANEL
Anion gap: 8 (ref 5–15)
BUN: 11 mg/dL (ref 8–23)
CO2: 27 mmol/L (ref 22–32)
Calcium: 9.5 mg/dL (ref 8.9–10.3)
Chloride: 99 mmol/L (ref 98–111)
Creatinine, Ser: 0.91 mg/dL (ref 0.44–1.00)
GFR, Estimated: >60 mL/min (ref >60)
Glucose, Bld: 117 mg/dL — ABNORMAL HIGH (ref 70–99)
Potassium: 4.1 mmol/L (ref 3.5–5.1)
Sodium: 134 mmol/L — ABNORMAL LOW (ref 135–145)

## 2020-05-03 LAB — SARS CORONAVIRUS 2 (TAT 6-24 HRS): SARS Coronavirus 2: NEGATIVE

## 2020-05-05 ENCOUNTER — Ambulatory Visit: Payer: Medicare Other

## 2020-05-05 ENCOUNTER — Encounter
Admission: RE | Disposition: A | Discharge status: Home / Self Care | Payer: Self-pay | Source: Home / Self Care | Attending: Urology

## 2020-05-05 ENCOUNTER — Other Ambulatory Visit: Payer: Self-pay

## 2020-05-05 ENCOUNTER — Ambulatory Visit: Payer: Medicare Other | Admitting: Registered Nurse

## 2020-05-05 ENCOUNTER — Encounter: Payer: Self-pay | Admitting: Urology

## 2020-05-05 ENCOUNTER — Ambulatory Visit
Admission: RE | Admit: 2020-05-05 | Discharge: 2020-05-05 | Disposition: A | Discharge status: Home / Self Care | Payer: Medicare Other | Attending: Urology | Admitting: Urology

## 2020-05-05 DIAGNOSIS — K449 Diaphragmatic hernia without obstruction or gangrene: Secondary | ICD-10-CM | POA: Diagnosis not present

## 2020-05-05 DIAGNOSIS — Z466 Encounter for fitting and adjustment of urinary device: Secondary | ICD-10-CM | POA: Diagnosis not present

## 2020-05-05 DIAGNOSIS — N130 Hydronephrosis with ureteropelvic junction obstruction: Secondary | ICD-10-CM | POA: Diagnosis not present

## 2020-05-05 DIAGNOSIS — N135 Crossing vessel and stricture of ureter without hydronephrosis: Secondary | ICD-10-CM

## 2020-05-05 DIAGNOSIS — Z79899 Other long term (current) drug therapy: Secondary | ICD-10-CM | POA: Diagnosis not present

## 2020-05-05 DIAGNOSIS — Z96653 Presence of artificial knee joint, bilateral: Secondary | ICD-10-CM | POA: Insufficient documentation

## 2020-05-05 DIAGNOSIS — Z87891 Personal history of nicotine dependence: Secondary | ICD-10-CM | POA: Insufficient documentation

## 2020-05-05 DIAGNOSIS — E559 Vitamin D deficiency, unspecified: Secondary | ICD-10-CM | POA: Diagnosis not present

## 2020-05-05 DIAGNOSIS — J449 Chronic obstructive pulmonary disease, unspecified: Secondary | ICD-10-CM | POA: Diagnosis not present

## 2020-05-05 HISTORY — PX: CYSTOSCOPY W/ URETERAL STENT PLACEMENT: SHX1429

## 2020-05-05 SURGERY — CYSTOSCOPY, WITH RETROGRADE PYELOGRAM AND URETERAL STENT INSERTION
Anesthesia: General | Laterality: Left

## 2020-05-05 MED ORDER — LACTATED RINGERS IV SOLN: INTRAVENOUS | Status: DC

## 2020-05-05 MED ORDER — PROPOFOL 10 MG/ML IV BOLUS
INTRAVENOUS | Status: DC | PRN
  Administered 2020-05-05: 50 mg via INTRAVENOUS

## 2020-05-05 MED ORDER — CEFAZOLIN SODIUM 1 G IJ SOLR
INTRAMUSCULAR | Status: AC
  Filled 2020-05-05: qty 20

## 2020-05-05 MED ORDER — ACETAMINOPHEN 10 MG/ML IV SOLN
INTRAVENOUS | Status: DC | PRN
  Administered 2020-05-05: 1000 mg via INTRAVENOUS

## 2020-05-05 MED ORDER — FENTANYL CITRATE (PF) 100 MCG/2ML IJ SOLN
INTRAMUSCULAR | Status: AC
  Filled 2020-05-05: qty 2

## 2020-05-05 MED ORDER — CHLORHEXIDINE GLUCONATE 0.12 % MT SOLN
OROMUCOSAL | Status: AC
  Filled 2020-05-05: qty 15

## 2020-05-05 MED ORDER — FAMOTIDINE 20 MG PO TABS
20.0000 mg | ORAL_TABLET | Freq: Once | ORAL | Status: AC
  Administered 2020-05-05: 20 mg via ORAL

## 2020-05-05 MED ORDER — FENTANYL CITRATE (PF) 100 MCG/2ML IJ SOLN
INTRAMUSCULAR | Status: DC | PRN
  Administered 2020-05-05: 25 ug via INTRAVENOUS

## 2020-05-05 MED ORDER — PROPOFOL 500 MG/50ML IV EMUL
INTRAVENOUS | Status: DC | PRN
  Administered 2020-05-05: 70 ug/kg/min via INTRAVENOUS

## 2020-05-05 MED ORDER — CHLORHEXIDINE GLUCONATE 0.12 % MT SOLN
15.0000 mL | Freq: Once | OROMUCOSAL | Status: AC
  Administered 2020-05-05: 15 mL via OROMUCOSAL

## 2020-05-05 MED ORDER — FENTANYL CITRATE (PF) 100 MCG/2ML IJ SOLN: 25.0000 ug | INTRAMUSCULAR | Status: DC | PRN

## 2020-05-05 MED ORDER — ACETAMINOPHEN 10 MG/ML IV SOLN
INTRAVENOUS | Status: AC
  Filled 2020-05-05: qty 100

## 2020-05-05 MED ORDER — FAMOTIDINE 20 MG PO TABS
ORAL_TABLET | ORAL | Status: AC
  Filled 2020-05-05: qty 1

## 2020-05-05 MED ORDER — LIDOCAINE HCL (CARDIAC) PF 100 MG/5ML IV SOSY
PREFILLED_SYRINGE | INTRAVENOUS | Status: DC | PRN
  Administered 2020-05-05: 60 mg via INTRAVENOUS

## 2020-05-05 MED ORDER — CEFAZOLIN SODIUM-DEXTROSE 2-3 GM-%(50ML) IV SOLR
INTRAVENOUS | Status: DC | PRN
  Administered 2020-05-05: 2 g via INTRAVENOUS

## 2020-05-05 MED ORDER — ONDANSETRON HCL 4 MG/2ML IJ SOLN: 4.0000 mg | Freq: Once | INTRAMUSCULAR | Status: DC | PRN

## 2020-05-05 MED ORDER — LACTATED RINGERS IV SOLN: INTRAVENOUS | Status: DC | PRN

## 2020-05-05 MED ORDER — ORAL CARE MOUTH RINSE: 15.0000 mL | Freq: Once | OROMUCOSAL | Status: AC

## 2020-05-05 SURGICAL SUPPLY — 22 items
BAG DRAIN CYSTO-URO LG1000N (MISCELLANEOUS) ×2 IMPLANT
BRUSH SCRUB EZ 1% IODOPHOR (MISCELLANEOUS) IMPLANT
CATH URETL 5X70 OPEN END (CATHETERS) ×2 IMPLANT
CONRAY 43 FOR UROLOGY 50M (MISCELLANEOUS) ×2 IMPLANT
GLOVE SURG UNDER POLY LF SZ7.5 (GLOVE) ×2 IMPLANT
GOWN STRL REUS W/ TWL LRG LVL3 (GOWN DISPOSABLE) ×1 IMPLANT
GOWN STRL REUS W/ TWL XL LVL3 (GOWN DISPOSABLE) ×1 IMPLANT
GOWN STRL REUS W/TWL LRG LVL3 (GOWN DISPOSABLE) ×2
GOWN STRL REUS W/TWL XL LVL3 (GOWN DISPOSABLE) ×2
GUIDEWIRE STR DUAL SENSOR (WIRE) ×2 IMPLANT
IV NS IRRIG 3000ML ARTHROMATIC (IV SOLUTION) ×2 IMPLANT
KIT TURNOVER CYSTO (KITS) ×2 IMPLANT
MANIFOLD NEPTUNE II (INSTRUMENTS) ×2 IMPLANT
PACK CYSTO AR (MISCELLANEOUS) ×2 IMPLANT
SET CYSTO W/LG BORE CLAMP LF (SET/KITS/TRAYS/PACK) ×2 IMPLANT
STENT URET 6FRX24 CONTOUR (STENTS) IMPLANT
STENT URET 6FRX26 CONTOUR (STENTS) IMPLANT
STENT URO INLAY 6FRX24CM (STENTS) ×1 IMPLANT
SURGILUBE 2OZ TUBE FLIPTOP (MISCELLANEOUS) ×2 IMPLANT
SYR TOOMEY IRRIG 70ML (MISCELLANEOUS)
SYRINGE TOOMEY IRRIG 70ML (MISCELLANEOUS) IMPLANT
WATER STERILE IRR 1000ML POUR (IV SOLUTION) ×2 IMPLANT

## 2020-05-05 NOTE — H&P (Signed)
05/05/20 8:55 AM   Adaly H Pankratz October 30, 1928 [id number]  CC: left UPJ obstruction  HPI: Briefly, she is a very nice 87 year old female who originally presented in the fall 2019 with recurrent urinary tract infections, severe left-sided flank pain, and was ultimately found to have new onset of left idiopathic UPJ obstruction with moderate to severe upstream hydronephrosis. Diagnostic ureteroscopy in January 2020 did not show any concerning lesions and cytology was negative. She underwent stent placement at that time with complete resolution of her left-sided pain. She has had approximately 3-4 UTIs per year with the stent in place, and the stent was last changed in 04/2019.  Her UTI symptoms typically are confusion and cloudy urine.  We again reviewed the differences between asymptomatic bacteriuria and true UTI, but with her age and baseline dementia it can be challenging.  Overall, she feels she is doing much better with the stent in place compared to without the stent when she was having severe left-sided flank pain.  She denies any urinary symptoms of urgency, frequency, or flank pain.   PMH: Past Medical History:  Diagnosis Date  . Actinic keratosis   . Adnexal mass 06/03/2014   since 2009  . Arthritis   . Chronic kidney disease   . Complication of anesthesia   . COPD (chronic obstructive pulmonary disease) ([place])   . Diffuse large cell lymphoma in remission ([place]) 01/2007   NON-HODGKINS-stage 3, cd 20 positive; status post 6 cycles of R-CHOP  . Dyspnea    with exertion  . Herpes zoster without complication 0/03/5463  . History of hiatal hernia   . HOH (hard of hearing)    wears hearing aides  . Non Hodgkin's lymphoma ([place])   . Personal history of chemotherapy   . PONV (postoperative nausea and vomiting)    in January lasted about 4 hours  . Recurrent UTI   . Squamous cell carcinoma of skin 11/09/2008   Right post. lat. elbow. SCCis arising in [place]. Excised 01/04/2009,  margins free.     Surgical History: Past Surgical History:  Procedure Laterality Date  . ABDOMINAL SURGERY  2009   abdominal mass+ NH lymphoma,  . BACK SURGERY  01/2017   fusion. metal plate in neck at back  . CATARACT EXTRACTION  1997   right eye and left ey  . cervical neck fusion  1995  . CYSTOSCOPY W/ RETROGRADES Left 01/30/2018   Procedure: CYSTOSCOPY WITH RETROGRADE PYELOGRAM;  Surgeon: Billey Co, MD;  Location: [facility];  Service: Urology;  Laterality: Left;  . CYSTOSCOPY W/ RETROGRADES Left 08/05/2018   Procedure: CYSTOSCOPY WITH RETROGRADE PYELOGRAM;  Surgeon: Billey Co, MD;  Location: [facility];  Service: Urology;  Laterality: Left;  . CYSTOSCOPY W/ URETERAL STENT PLACEMENT Left 08/05/2018   Procedure: CYSTOSCOPY WITH STENT Exchange;  Surgeon: Billey Co, MD;  Location: [facility];  Service: Urology;  Laterality: Left;  . CYSTOSCOPY W/ URETERAL STENT PLACEMENT Left 05/21/2019   Procedure: CYSTOSCOPY WITH STENT REPLACEMENT;  Surgeon: Billey Co, MD;  Location: [facility];  Service: Urology;  Laterality: Left;  . CYSTOSCOPY WITH BIOPSY Left 02/27/2018   Procedure: CYSTOSCOPY WITH BIOPSY;  Surgeon: Billey Co, MD;  Location: [facility];  Service: Urology;  Laterality: Left;  . CYSTOSCOPY WITH STENT PLACEMENT Left 01/30/2018   Procedure: CYSTOSCOPY WITH STENT PLACEMENT;  Surgeon: Billey Co, MD;  Location: [facility];  Service: Urology;  Laterality: Left;  . CYSTOSCOPY WITH URETEROSCOPY AND STENT PLACEMENT Left 02/27/2018  Procedure: CYSTOSCOPY WITH URETEROSCOPY AND STENT Exchange;  Surgeon: Billey Co, MD;  Location: [facility];  Service: Urology;  Laterality: Left;  . DENTAL SURGERY     screws  . JOINT REPLACEMENT Left    knee  . KNEE ARTHROPLASTY Right 12/23/2018   Procedure: RIGHT COMPUTER ASSISTED TOTAL KNEE ARTHROPLASTY;  Surgeon: Dereck Leep, MD;  Location: [facility];  Service: Orthopedics;  Laterality: Right;  . KYPHOPLASTY N/A 02/21/2017    Procedure: [id number];  Surgeon: Hessie Knows, MD;  Location: [facility];  Service: Orthopedics;  Laterality: N/A;  . laparotomy with biopsy  03/02/2007  . PORTACATH PLACEMENT  2009  . [place]  . SQUAMOUS CELL CARCINOMA EXCISION     right arm  . TOTAL KNEE ARTHROPLASTY Left    2009  . URETEROSCOPY Left 01/30/2018   Procedure: URETEROSCOPY;  Surgeon: Billey Co, MD;  Location: [facility];  Service: Urology;  Laterality: Left;  Marland Kitchen VAGINAL HYSTERECTOMY  1971    Family History: Family History  Problem Relation Age of Onset  . Breast cancer Sister   . Dementia Sister   . Cataracts Sister   . Heart attack Brother   . CAD Brother   . Heart attack Brother   . Leukemia Grandchild        granddaughter  . Kidney disease Neg Hx   . Bladder Cancer Neg Hx     Social History:  reports that she quit smoking about 30 years ago. Her smoking use included cigarettes. She smoked 0.25 packs per day. She has never used smokeless tobacco. She reports current alcohol use of about 14.0 standard drinks of alcohol per week. She reports that she does not use drugs.  Physical Exam: BP (!) 141/66   Pulse 80   Temp 97.8 F (36.6 C) (Temporal)   Resp 18   Ht 5\' 3"  (1.6 m)   Wt 63.5 kg   SpO2 99%   BMI 24.80 kg/m    Constitutional:  Alert and oriented, No acute distress. Cardiovascular: RRR Respiratory: clear to auscultation bilaterally GI: Abdomen is soft, nontender, nondistended, no abdominal masses   Laboratory Data: Culture reviewed, was started on antibiotics 5 days prior  Assessment & Plan:   87 year old female with chronic left UPJ obstruction, here for yearly left ureteral stent change.  Nickolas Madrid, MD 05/05/2020  [facility] [address], [place], [place] [address] [phone]

## 2020-05-05 NOTE — Transfer of Care (Signed)
Immediate Anesthesia Transfer of Care Note  Patient: [place]  Procedure(s) Performed: CYSTOSCOPY WITH STENT REPLACEMENT (Left )  Patient Location: PACU  Anesthesia Type:General  Level of Consciousness: awake and alert   Airway & Oxygen Therapy: Patient Spontanous Breathing and Patient connected to face mask oxygen  Post-op Assessment: Report given to RN and Post -op Vital signs reviewed and stable  Post vital signs: Reviewed and stable  Last Vitals:  Vitals Value Taken Time  BP 105/55 05/05/20 0941  Temp    Pulse 73 05/05/20 0941  Resp 18 05/05/20 0941  SpO2 100 % 05/05/20 0941  Vitals shown include unvalidated device data.  Last Pain:  Vitals:   05/05/20 0941  TempSrc:   PainSc: 0-No pain      Patients Stated Pain Goal: 0 (92/42/68 [id number])  Complications: No complications documented.

## 2020-05-05 NOTE — Anesthesia Preprocedure Evaluation (Addendum)
Anesthesia Evaluation  Patient identified by MRN, date of birth, ID band Patient awake    Reviewed: Allergy & Precautions, H&P , NPO status , Patient's Chart, lab work & pertinent test results  History of Anesthesia Complications (+) PONV and history of anesthetic complications  Airway Mallampati: III  TM Distance: <3 FB Neck ROM: limited    Dental  (+) Chipped, Poor Dentition, Missing, Upper Dentures   Pulmonary shortness of breath, COPD, former smoker,    breath sounds clear to auscultation       Cardiovascular Exercise Tolerance: Good (-) angina(-) Past MI and (-) DOE + Valvular Problems/Murmurs MR  Rhythm:Regular Rate:Normal  1. Left ventricular ejection fraction, by estimation, is 55 to 60%. The  left ventricle has normal function. The left ventricle has no regional  wall motion abnormalities. Left ventricular diastolic parameters were  normal.  2. Right ventricular systolic function is normal. The right ventricular  size is normal.  3. Left atrial size was mildly dilated.  4. The mitral valve is degenerative. Mild to moderate mitral valve  regurgitation.  5. Tricuspid valve regurgitation is mild to moderate.  6. The aortic valve is normal in structure. Aortic valve regurgitation is  trivial.    Neuro/Psych negative neurological ROS  negative psych ROS   GI/Hepatic Neg liver ROS, hiatal hernia, GERD  Medicated and Controlled,  Endo/Other  Hypothyroidism   Renal/GU Renal disease     Musculoskeletal  (+) Arthritis ,   Abdominal   Peds  Hematology negative hematology ROS (+)   Anesthesia Other Findings Past Medical History: 06/03/2014: Adnexal mass     Comment:  since 2009 No date: Arthritis No date: Complication of anesthesia No date: COPD (chronic obstructive pulmonary disease) ([place]) 01/2007: 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)     Comment:  NON-HODGKINS-stage 3, cd 20 positive; status  post 6               cycles of R-CHOP No date: Dyspnea     Comment:  with exertion No date: GERD (gastroesophageal reflux disease) 01/31/2009: Herpes zoster without complication No date: History of hiatal hernia No date: History of kidney stones No date: HOH (hard of hearing)     Comment:  wears hearing aides No date: Non Hodgkin's lymphoma ([place]) No date: Personal history of chemotherapy No date: PONV (postoperative nausea and vomiting)     Comment:  in January lasted about 4 hours No date: Recurrent UTI  Past Surgical History: 2009: ABDOMINAL SURGERY     Comment:  abdominal mass+ NH lymphoma, 01/2017: BACK SURGERY     Comment:  fusion. metal plate in neck at back 1997: CATARACT EXTRACTION     Comment:  right eye and left ey 1995: cervical neck fusion 01/30/2018: CYSTOSCOPY W/ RETROGRADES; Left     Comment:  Procedure: CYSTOSCOPY WITH RETROGRADE PYELOGRAM;                Surgeon: Billey Co, MD;  Location: [facility];                Service: Urology;  Laterality: Left; 08/05/2018: CYSTOSCOPY W/ RETROGRADES; Left     Comment:  Procedure: CYSTOSCOPY WITH RETROGRADE PYELOGRAM;                Surgeon: Billey Co, MD;  Location: [facility];                Service: Urology;  Laterality: Left; 08/05/2018: CYSTOSCOPY W/ URETERAL STENT PLACEMENT; Left  Comment:  Procedure: CYSTOSCOPY WITH STENT Exchange;  Surgeon:               Billey Co, MD;  Location: [facility];  Service:               Urology;  Laterality: Left; 02/27/2018: CYSTOSCOPY WITH BIOPSY; Left     Comment:  Procedure: CYSTOSCOPY WITH BIOPSY;  Surgeon: Billey Co, MD;  Location: [facility];  Service: Urology;                Laterality: Left; 01/30/2018: [place]; Left     Comment:  Procedure: CYSTOSCOPY WITH STENT PLACEMENT;  Surgeon:               Billey Co, MD;  Location: [facility];  Service:               Urology;  Laterality: Left; 02/27/2018: CYSTOSCOPY WITH  URETEROSCOPY AND STENT PLACEMENT; Left     Comment:  Procedure: CYSTOSCOPY WITH URETEROSCOPY AND STENT               Exchange;  Surgeon: Billey Co, MD;  Location: [facility];  Service: Urology;  Laterality: Left; No date: DENTAL SURGERY     Comment:  screws No date: JOINT REPLACEMENT; Left     Comment:  knee 12/23/2018: KNEE ARTHROPLASTY; Right     Comment:  Procedure: RIGHT COMPUTER ASSISTED TOTAL KNEE               ARTHROPLASTY;  Surgeon: Dereck Leep, MD;  Location:               [facility];  Service: Orthopedics;  Laterality: Right; 02/21/2017: KYPHOPLASTY; N/A     Comment:  Procedure: [id number];  Surgeon: Hessie Knows, MD;               Location: [facility];  Service: Orthopedics;  Laterality:               N/A; 03/02/2007: laparotomy with biopsy 2009: [place]: SPINE SURGERY No date: SQUAMOUS CELL CARCINOMA EXCISION     Comment:  right arm No date: TOTAL KNEE ARTHROPLASTY; Left     Comment:  2009 01/30/2018: URETEROSCOPY; Left     Comment:  Procedure: URETEROSCOPY;  Surgeon: Billey Co, MD;              Location: [facility];  Service: Urology;  Laterality: Left; 1971: VAGINAL HYSTERECTOMY  BMI    Body Mass Index: 25.81 kg/m      Reproductive/Obstetrics negative OB ROS                            Anesthesia Physical  Anesthesia Plan  ASA: III  Anesthesia Plan: General   Post-op Pain Management:    Induction: Intravenous  PONV Risk Score and Plan: 4 or greater and Ondansetron, Propofol infusion, TIVA and Treatment may vary due to age or medical condition  Airway Management Planned: Nasal Cannula  Additional Equipment: None  Intra-op Plan:   Post-operative Plan:   Informed Consent: I have reviewed the patients History and Physical, chart, labs and discussed the procedure including the risks, benefits and alternatives for the proposed anesthesia with the patient or authorized representative who has  indicated his/her  understanding and acceptance.     Dental advisory given  Plan Discussed with: CRNA and Surgeon  Anesthesia Plan Comments: (Discussed risks of anesthesia with patient, including possibility of difficulty with spontaneous ventilation under anesthesia necessitating airway intervention, PONV, and rare risks such as cardiac or respiratory or neurological events. Patient understands. Did well in terms of PONV after last anesthetic; will emulate similar anesthetic this time.)        Anesthesia Quick Evaluation

## 2020-05-05 NOTE — Discharge Instructions (Signed)

## 2020-05-05 NOTE — Op Note (Signed)
Date of procedure: 05/05/20  Preoperative diagnosis:  1. Chronic left UPJ obstruction  Postoperative diagnosis:  1. Same   Procedure: 1. Cystoscopy, left retrograde pyelogram with intraoperative interpretation, left ureteral stent placement  Surgeon: Nickolas Madrid, MD  Anesthesia: General  Complications: None  Intraoperative findings:  1.  Normal cystoscopy, no encrustation of old stent 2.  Retrograde pyelogram with persistent narrowing at left UPJ, no significant hydronephrosis  EBL: None  Specimens: None  Drains: Left 6 French by 24 cm Bard Optima stent  Indication: Monica Singh is a 87 y.o. patient with chronic left UPJ obstruction managed with yearly stent changes.  After reviewing the management options for treatment, they elected to proceed with the above surgical procedure(s). We have discussed the potential benefits and risks of the procedure, side effects of the proposed treatment, the likelihood of the patient achieving the goals of the procedure, and any potential problems that might occur during the procedure or recuperation. Informed consent has been obtained.  Description of procedure:  The patient was taken to the operating room and MAC was induced. SCDs were placed for DVT prophylaxis. The patient was placed in the dorsal lithotomy position, prepped and draped in the usual sterile fashion, and preoperative antibiotics(Ancef) were administered. A preoperative time-out was performed.   A 21 French rigid cystoscope was used to intubate the urethra and thorough cystoscopy was performed.  The bladder was grossly normal throughout.  A sensor wire advanced easily alongside the left ureteral stent up into the kidney.  The old stent was removed.  There was no encrustation.  The cystoscope was reinserted and a left retrograde pyelogram showed some narrowing at the left UPJ but no significant hydronephrosis.  The Monica 6 [place] by 24 cm Bard Optima stent was  fluoroscopically placed over the wire with an excellent curl in the renal pelvis, as well as in the bladder.  The bladder was drained and this concluded our procedure.  Disposition: Stable to PACU  Plan: Likely can space stent changes to every 18 months  Nickolas Madrid, MD

## 2020-05-05 NOTE — Anesthesia Postprocedure Evaluation (Signed)
Anesthesia Post Note  Patient: [place]  Procedure(s) Performed: CYSTOSCOPY WITH RETROGRADE PYELOGRAM/URETERAL STENT EXCHANGE (Left )  Patient location during evaluation: PACU Anesthesia Type: General Level of consciousness: awake and alert Pain management: pain level controlled Vital Signs Assessment: post-procedure vital signs reviewed and stable Respiratory status: spontaneous breathing, nonlabored ventilation, respiratory function stable and patient connected to nasal cannula oxygen Cardiovascular status: blood pressure returned to baseline and stable Postop Assessment: no apparent nausea or vomiting Anesthetic complications: no   No complications documented.   Last Vitals:  Vitals:   05/05/20 1009 05/05/20 1035  BP: (!) 123/96 (!) 159/77  Pulse:  83  Resp: (!) 28 18  Temp: 36.4 C (!) 36.3 C  SpO2: 98% 98%    Last Pain:  Vitals:   05/05/20 1035  TempSrc: Temporal  PainSc: 0-No pain                 Arita Miss

## 2020-05-18 ENCOUNTER — Other Ambulatory Visit: Payer: Self-pay | Admitting: Internal Medicine

## 2020-05-18 NOTE — Telephone Encounter (Signed)
LVM to schedule

## 2020-05-18 NOTE — Telephone Encounter (Signed)
Please schedule 2 month F/U appointment. Thank you!

## 2020-05-20 DIAGNOSIS — N390 Urinary tract infection, site not specified: Secondary | ICD-10-CM | POA: Diagnosis not present

## 2020-05-24 NOTE — Telephone Encounter (Signed)
Scheduled for 4/29

## 2020-05-26 ENCOUNTER — Encounter: Payer: Self-pay | Admitting: Internal Medicine

## 2020-05-26 ENCOUNTER — Ambulatory Visit: Payer: Medicare Other | Admitting: Internal Medicine

## 2020-05-26 ENCOUNTER — Other Ambulatory Visit: Payer: Self-pay

## 2020-05-26 VITALS — BP 130/62 | HR 108 | Ht 63.0 in | Wt 140.0 lb

## 2020-05-26 DIAGNOSIS — I6523 Occlusion and stenosis of bilateral carotid arteries: Secondary | ICD-10-CM | POA: Diagnosis not present

## 2020-05-26 DIAGNOSIS — R000 Tachycardia, unspecified: Secondary | ICD-10-CM

## 2020-05-26 DIAGNOSIS — R55 Syncope and collapse: Secondary | ICD-10-CM

## 2020-05-26 NOTE — Patient Instructions (Signed)
Medication Instructions:  Your physician recommends that you continue on your current medications as directed. Please refer to the Current Medication list given to you today.  *If you need a refill on your cardiac medications before your next appointment, please call your pharmacy*   Lab Work: None ordered If you have labs (blood work) drawn today and your tests are completely normal, you will receive your results only by: Marland Kitchen MyChart Message (if you have MyChart) OR . A paper copy in the mail If you have any lab test that is abnormal or we need to change your treatment, we will call you to review the results.   Testing/Procedures: None ordered   Follow-Up: At [facility], you and your health needs are our priority.  As part of our continuing mission to provide you with exceptional heart care, we have created designated Provider Care Teams.  These Care Teams include your primary Cardiologist (physician) and Advanced Practice Providers (APPs -  Physician Assistants and Nurse Practitioners) who all work together to provide you with the care you need, when you need it.  We recommend signing up for the patient portal called "MyChart".  Sign up information is provided on this After Visit Summary.  MyChart is used to connect with patients for Virtual Visits (Telemedicine).  Patients are able to view lab/test results, encounter notes, upcoming appointments, etc.  Non-urgent messages can be sent to your provider as well.   To learn more about what you can do with MyChart, go to [email or web address].    Your next appointment:   Your physician wants you to follow-up in: 6 months You will receive a reminder letter in the mail two months in advance. If you don't receive a letter, please call our office to schedule the follow-up appointment.   The format for your next appointment:   In Person  Provider:   You may see Nelva Bush, MD or one of the following Advanced Practice Providers on  your designated Care Team:    Murray Hodgkins, NP  Christell Faith, [name]  Marrianne Mood, [name]  Cadence Kathlen Mody, [name]  Laurann Montana, NP    Other Instructions Increase your water/fluids intake to 64oz daily and decrease your caffeine intake.

## 2020-05-26 NOTE — Progress Notes (Signed)
Follow-up Outpatient Visit Date: 05/26/2020  Primary Care Provider: Birdie Sons, MD [address] [place] [address]  Chief Complaint: Follow-up syncope  HPI:  Monica Singh is a 87 y.o. 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  She was last seen in our office by Christell Faith, PA, in late January, at which time she was doing fairly well without further syncope.  She had mild intermittent shortness of breath.  Preceding testing was largely unrevealing with echo showing normal LVEF with mild to moderate mitral regurgitation, carotid Doppler demonstrating mild carotid artery stenosis, and event monitor showing predominantly sinus rhythm with rare PACs and PVCs as well as a single 7 beat run of NSVT and 3 brief atrial runs.  Monica Singh did not wish to undergo ischemia testing at that time.  Today, Monica Singh reports feeling about the same.  She has not had any further syncopal episodes but has experienced occasional lightheadedness.  She has stable exertional dyspnea and decreased energy that she describes as "no get up and go."  On further questioning, she reports having felt this way for several decades.  She denies palpitations, chest pain, edema, and orthopnea.  --------------------------------------------------------------------------------------------------  Past Medical History:  Diagnosis Date  . Actinic keratosis   . Adnexal mass 06/03/2014   since 2009  . Arthritis   . Chronic kidney disease   . Complication of anesthesia   . COPD (chronic obstructive pulmonary disease) ([place])   . Diffuse large cell lymphoma in remission ([place]) 01/2007   NON-HODGKINS-stage 3, cd 20  positive; status post 6 cycles of R-CHOP  . Dyspnea    with exertion  . Herpes zoster without complication 03/30/4399  . History of hiatal hernia   . HOH (hard of hearing)    wears hearing aides  . Non Hodgkin's lymphoma ([place])   . Personal history of chemotherapy   . PONV (postoperative nausea and vomiting)    in January lasted about 4 hours  . Recurrent UTI   . Squamous cell carcinoma of skin 11/09/2008   Right post. lat. elbow. SCCis arising in [place]. Excised 01/04/2009, margins free.    Past Surgical History:  Procedure Laterality Date  . ABDOMINAL SURGERY  2009   abdominal mass+ NH lymphoma,  . BACK SURGERY  01/2017   fusion. metal plate in neck at back  . CATARACT EXTRACTION  1997   right eye and left ey  . cervical neck fusion  1995  . CYSTOSCOPY W/ RETROGRADES Left 01/30/2018   Procedure: CYSTOSCOPY WITH RETROGRADE PYELOGRAM;  Surgeon: Billey Co, MD;  Location: [facility];  Service: Urology;  Laterality: Left;  . CYSTOSCOPY W/ RETROGRADES Left 08/05/2018   Procedure: CYSTOSCOPY WITH RETROGRADE PYELOGRAM;  Surgeon: Billey Co, MD;  Location: [facility];  Service: Urology;  Laterality: Left;  . CYSTOSCOPY W/ URETERAL STENT PLACEMENT Left 08/05/2018   Procedure: CYSTOSCOPY WITH STENT Exchange;  Surgeon: Billey Co, MD;  Location: [facility];  Service: Urology;  Laterality: Left;  . CYSTOSCOPY W/ URETERAL STENT PLACEMENT Left 05/21/2019   Procedure: CYSTOSCOPY WITH STENT REPLACEMENT;  Surgeon: Billey Co, MD;  Location: [facility];  Service: Urology;  Laterality: Left;  . CYSTOSCOPY W/ URETERAL STENT PLACEMENT Left 05/05/2020  Procedure: CYSTOSCOPY WITH RETROGRADE PYELOGRAM/URETERAL STENT EXCHANGE;  Surgeon: Billey Co, MD;  Location: [facility];  Service: Urology;  Laterality: Left;  . CYSTOSCOPY WITH BIOPSY Left 02/27/2018   Procedure: CYSTOSCOPY WITH BIOPSY;  Surgeon: Billey Co, MD;  Location: [facility];  Service: Urology;  Laterality: Left;  . CYSTOSCOPY WITH  STENT PLACEMENT Left 01/30/2018   Procedure: CYSTOSCOPY WITH STENT PLACEMENT;  Surgeon: Billey Co, MD;  Location: [facility];  Service: Urology;  Laterality: Left;  . CYSTOSCOPY WITH URETEROSCOPY AND STENT PLACEMENT Left 02/27/2018   Procedure: CYSTOSCOPY WITH URETEROSCOPY AND STENT Exchange;  Surgeon: Billey Co, MD;  Location: [facility];  Service: Urology;  Laterality: Left;  . DENTAL SURGERY     screws  . JOINT REPLACEMENT Left    knee  . KNEE ARTHROPLASTY Right 12/23/2018   Procedure: RIGHT COMPUTER ASSISTED TOTAL KNEE ARTHROPLASTY;  Surgeon: Dereck Leep, MD;  Location: [facility];  Service: Orthopedics;  Laterality: Right;  . KYPHOPLASTY N/A 02/21/2017   Procedure: [id number];  Surgeon: Hessie Knows, MD;  Location: [facility];  Service: Orthopedics;  Laterality: N/A;  . laparotomy with biopsy  03/02/2007  . PORTACATH PLACEMENT  2009  . [place]  . SQUAMOUS CELL CARCINOMA EXCISION     right arm  . TOTAL KNEE ARTHROPLASTY Left    2009  . URETEROSCOPY Left 01/30/2018   Procedure: URETEROSCOPY;  Surgeon: Billey Co, MD;  Location: [facility];  Service: Urology;  Laterality: Left;  Marland Kitchen VAGINAL HYSTERECTOMY  1971    Current Meds  Medication Sig  . folic acid (FOLVITE) 1 MG tablet Take 1 tablet (1 mg total) by mouth daily.  Marland Kitchen ibuprofen (ADVIL) 200 MG tablet Take 400 mg by mouth every 6 (six) hours as needed for moderate pain.  . Ipratropium-Albuterol (COMBIVENT RESPIMAT) 20-100 MCG/ACT AERS respimat Inhale 1 puff into the lungs every 8 (eight) hours as needed for wheezing.  . rosuvastatin (CRESTOR) 5 MG tablet Take 1 tablet (5 mg total) by mouth daily. Please schedule follow-up appointment for further refills. Thank you!  . vitamin B-12 1000 MCG tablet Take 1 tablet (1,000 mcg total) by mouth daily.  . Vitamin D, Ergocalciferol, (DRISDOL) 1.25 MG (50000 UNIT) CAPS capsule Take 1 capsule (50,000 Units total) by mouth every 7 (seven) days.    Allergies: Milk-related  compounds, Lac bovis, and Sulfa antibiotics  Social History   Tobacco Use  . Smoking status: Former Smoker    Packs/day: 0.25    Types: Cigarettes    Quit date: 06/21/1989    Years since quitting: 30.9  . Smokeless tobacco: Never Used  Vaping Use  . Vaping Use: Never used  Substance Use Topics  . Alcohol use: Yes    Alcohol/week: 14.0 standard drinks    Types: 14 Glasses of wine per week    Comment: 2 glasses a night  . Drug use: No    Family History  Problem Relation Age of Onset  . Breast cancer Sister   . Dementia Sister   . Cataracts Sister   . Heart attack Brother   . CAD Brother   . Heart attack Brother   . Leukemia Grandchild        granddaughter  . Kidney disease Neg Hx   . Bladder Cancer Neg Hx     Review of Systems: A 12-system review of systems was performed and was negative except as noted in the HPI.  --------------------------------------------------------------------------------------------------  Physical Exam: BP 130/62 (BP  Location: Left Arm, Patient Position: Sitting, Cuff Size: Normal)   Pulse (!) 108   Ht 5\' 3"  (1.6 m)   Wt 140 lb (63.5 kg)   SpO2 96%   BMI 24.80 kg/m   General:  NAD.  Accompanied by her daughter. Neck: No JVD or HJR. Lungs: Clear to auscultation bilaterally without wheezes or crackles. Heart: Tachycardic but regular with 2/6 systolic murmur.  No rubs or gallops.. Abdomen: Soft, nontender, nondistended. Extremities: No lower extremity edema.  EKG:  Sinus tachycardia (HR 108 bpm) with left axis deviation, LVH, and poor R wave progression.  HR has increased since 12/22/2019.  Otherwise, no significant interval change.  Lab Results  Component Value Date   WBC 6.6 05/03/2020   HGB 13.9 05/03/2020   HCT 41.9 05/03/2020   MCV 95.4 05/03/2020   PLT 311 05/03/2020    Lab Results  Component Value Date   NA 134 (L) 05/03/2020   K 4.1 05/03/2020   CL 99 05/03/2020   CO2 27 05/03/2020   BUN 11 05/03/2020   CREATININE  0.91 05/03/2020   GLUCOSE 117 (H) 05/03/2020   ALT 13 12/19/2019    Lab Results  Component Value Date   CHOL 175 12/20/2019   HDL 73 12/20/2019   LDLCALC 91 12/20/2019   TRIG 53 12/20/2019   CHOLHDL 2.4 12/20/2019    --------------------------------------------------------------------------------------------------  ASSESSMENT AND PLAN: Syncope: No further episodes reported.  I suspect that episodes may have been a combination of dehydration and vasovagal syncope.  Event monitor showed brief episodes of PSVT and NSVT that were asymptomatic.  Echo last year demonstrated normal LVEF with mild to moderate MR but no abnormality explain syncope.  EKG today shows sinus tachycardia.  We discussed ischemia evaluation, but given lack of symptoms and advanced age, have agreed to defer this again.  I encouraged Ms. Pacey to stay well hydrated and attempt to drink at least 64 ounces of water per day.  Carotid artery stenosis: Mild plaquing without significant stenosis noted in 11/2019.  Continue rosuvastatin to prevent progression of disease; LDL reasonable at 91 on last check in 11/2019.  Sinus tachycardia: Incidentally noted today without symptoms.  I wonder if dehydration may be playing a role.  I encouraged increase intake of water and other noncaffeinated beverages.  Follow-up: Return to clinic in 6 months.  Nelva Bush, MD 05/26/2020 1:59 PM

## 2020-05-27 ENCOUNTER — Encounter: Payer: Self-pay | Admitting: Internal Medicine

## 2020-05-27 DIAGNOSIS — I6523 Occlusion and stenosis of bilateral carotid arteries: Secondary | ICD-10-CM | POA: Insufficient documentation

## 2020-06-22 NOTE — Progress Notes (Signed)
06/23/2020 10:29 AM   [place] 1928/08/15 [id number]  Referring provider: Birdie Sons, MD [address] [place],  [place] [address]  Chief Complaint  Patient presents with  . Recurrent UTI   Urological history: 1. Left UPJ obstruction -creatinine 0.91 in 04/2020  -managed with an indwelling Optima Bard ureteral stent exchanged yearly -last exchange 04/2020 -Diagnostic ureteroscopy in January 2020 did not show any concerning lesions and cytology was negative  2. rUTI's -contributing factors of age and vaginal atrophy -documented positive urine cultures over the last year  Aerococcus urinae 04/21/2020  Kocuria species 04/04/2020  Klebsiella pneumoniae resistant to ampicillin 10/16/2019  Staphylococcus epidermidis and viridans streptococcus group resistant to fluoroquinolones and PCN 09/06/2019  Citrobacter freundi resistant to cefuroxime, ampicillin and cefazolin on 08/20/2019     HPI: Monica Singh is a 87 y.o. female who presents with cloudy urine.  She states that she started experiencing cloudy urine for the last 3 days associated with left-sided flank pain.  She also has a headache today.  She denies fever, chills, nausea or vomiting.  Patient denies any modifying or aggravating factors.  Patient denies any gross hematuria, dysuria or suprapubic.  Patient denies any fevers, chills, nausea or vomiting.   UA > 30 WBC's and few bacteria.   PMH: Past Medical History:  Diagnosis Date  . Actinic keratosis   . Adnexal mass 06/03/2014   since 2009  . Arthritis   . Chronic kidney disease   . Complication of anesthesia   . COPD (chronic obstructive pulmonary disease) ([place])   . Diffuse large cell lymphoma in remission ([place]) 01/2007   NON-HODGKINS-stage 3, cd 20 positive; status post 6 cycles of R-CHOP  . Dyspnea    with exertion  . Herpes zoster without complication 05/05/5460  . History of hiatal hernia   . HOH (hard of hearing)    wears  hearing aides  . Non Hodgkin's lymphoma ([place])   . Personal history of chemotherapy   . PONV (postoperative nausea and vomiting)    in January lasted about 4 hours  . Recurrent UTI   . Squamous cell carcinoma of skin 11/09/2008   Right post. lat. elbow. SCCis arising in [place]. Excised 01/04/2009, margins free.     Surgical History: Past Surgical History:  Procedure Laterality Date  . ABDOMINAL SURGERY  2009   abdominal mass+ NH lymphoma,  . BACK SURGERY  01/2017   fusion. metal plate in neck at back  . CATARACT EXTRACTION  1997   right eye and left ey  . cervical neck fusion  1995  . CYSTOSCOPY W/ RETROGRADES Left 01/30/2018   Procedure: CYSTOSCOPY WITH RETROGRADE PYELOGRAM;  Surgeon: Billey Co, MD;  Location: [facility];  Service: Urology;  Laterality: Left;  . CYSTOSCOPY W/ RETROGRADES Left 08/05/2018   Procedure: CYSTOSCOPY WITH RETROGRADE PYELOGRAM;  Surgeon: Billey Co, MD;  Location: [facility];  Service: Urology;  Laterality: Left;  . CYSTOSCOPY W/ URETERAL STENT PLACEMENT Left 08/05/2018   Procedure: CYSTOSCOPY WITH STENT Exchange;  Surgeon: Billey Co, MD;  Location: [facility];  Service: Urology;  Laterality: Left;  . CYSTOSCOPY W/ URETERAL STENT PLACEMENT Left 05/21/2019   Procedure: CYSTOSCOPY WITH STENT REPLACEMENT;  Surgeon: Billey Co, MD;  Location: [facility];  Service: Urology;  Laterality: Left;  . CYSTOSCOPY W/ URETERAL STENT PLACEMENT Left 05/05/2020   Procedure: CYSTOSCOPY WITH RETROGRADE PYELOGRAM/URETERAL STENT EXCHANGE;  Surgeon: Billey Co, MD;  Location: [facility];  Service: Urology;  Laterality: Left;  . CYSTOSCOPY WITH BIOPSY Left 02/27/2018   Procedure: CYSTOSCOPY WITH BIOPSY;  Surgeon: Billey Co, MD;  Location: [facility];  Service: Urology;  Laterality: Left;  . CYSTOSCOPY WITH STENT PLACEMENT Left 01/30/2018   Procedure: CYSTOSCOPY WITH STENT PLACEMENT;  Surgeon: Billey Co, MD;  Location: [facility];  Service: Urology;  Laterality:  Left;  . CYSTOSCOPY WITH URETEROSCOPY AND STENT PLACEMENT Left 02/27/2018   Procedure: CYSTOSCOPY WITH URETEROSCOPY AND STENT Exchange;  Surgeon: Billey Co, MD;  Location: [facility];  Service: Urology;  Laterality: Left;  . DENTAL SURGERY     screws  . JOINT REPLACEMENT Left    knee  . KNEE ARTHROPLASTY Right 12/23/2018   Procedure: RIGHT COMPUTER ASSISTED TOTAL KNEE ARTHROPLASTY;  Surgeon: Dereck Leep, MD;  Location: [facility];  Service: Orthopedics;  Laterality: Right;  . KYPHOPLASTY N/A 02/21/2017   Procedure: [id number];  Surgeon: Hessie Knows, MD;  Location: [facility];  Service: Orthopedics;  Laterality: N/A;  . laparotomy with biopsy  03/02/2007  . PORTACATH PLACEMENT  2009  . [place]  . SQUAMOUS CELL CARCINOMA EXCISION     right arm  . TOTAL KNEE ARTHROPLASTY Left    2009  . URETEROSCOPY Left 01/30/2018   Procedure: URETEROSCOPY;  Surgeon: Billey Co, MD;  Location: [facility];  Service: Urology;  Laterality: Left;  Marland Kitchen VAGINAL HYSTERECTOMY  1971    Home Medications:  Allergies as of 06/23/2020      Reactions   Milk-related Compounds Diarrhea, Nausea And Vomiting   Gas too   Lac Bovis Diarrhea, Nausea And Vomiting   Milk allergy   Sulfa Antibiotics Hives, Itching      Medication List       Accurate as of Jun 23, 2020 10:29 AM. If you have any questions, ask your nurse or doctor.        celecoxib 100 MG capsule Commonly known as: CELEBREX Take 1 capsule by mouth 2 (two) times daily.   Combivent Respimat 20-100 MCG/ACT Aers respimat Generic drug: Ipratropium-Albuterol Inhale 1 puff into the lungs every 8 (eight) hours as needed for wheezing.   cyanocobalamin 1000 MCG tablet Take 1 tablet (1,000 mcg total) by mouth daily.   folic acid 1 MG tablet Commonly known as: FOLVITE Take 1 tablet (1 mg total) by mouth daily.   ibuprofen 200 MG tablet Commonly known as: ADVIL Take 400 mg by mouth every 6 (six) hours as needed for moderate pain.    nitrofurantoin (macrocrystal-monohydrate) 100 MG capsule Commonly known as: MACROBID Take 1 capsule (100 mg total) by mouth every 12 (twelve) hours. Started by: Zara Council, [name]   rosuvastatin 5 MG tablet Commonly known as: CRESTOR Take 1 tablet (5 mg total) by mouth daily. Please schedule follow-up appointment for further refills. Thank you!   Vitamin D (Ergocalciferol) 1.25 MG (50000 UNIT) Caps capsule Commonly known as: DRISDOL Take 1 capsule (50,000 Units total) by mouth every 7 (seven) days.       Allergies:  Allergies  Allergen Reactions  . Milk-Related Compounds Diarrhea and Nausea And Vomiting    Gas too  . Lac Bovis Diarrhea and Nausea And Vomiting    Milk allergy   . Sulfa Antibiotics Hives and Itching    Family History: Family History  Problem Relation Age of Onset  . Breast cancer Sister   . Dementia Sister   . Cataracts Sister   . Heart attack Brother   .  CAD Brother   . Heart attack Brother   . Leukemia Grandchild        granddaughter  . Kidney disease Neg Hx   . Bladder Cancer Neg Hx     Social History:  reports that she quit smoking about 31 years ago. Her smoking use included cigarettes. She smoked 0.25 packs per day. She has never used smokeless tobacco. She reports current alcohol use of about 14.0 standard drinks of alcohol per week. She reports that she does not use drugs.  ROS: Pertinent ROS in HPI  Physical Exam: BP (!) 181/75   Pulse 85   Ht 5\' 3"  (1.6 m)   Wt 140 lb (63.5 kg)   BMI 24.80 kg/m   Constitutional:  Well nourished. Alert and oriented, No acute distress. HEENT: [place] AT, mask in place.  Trachea midline Cardiovascular: No clubbing, cyanosis, or edema. Respiratory: Normal respiratory effort, no increased work of breathing. Neurologic: Grossly intact, no focal deficits, moving all 4 extremities. Psychiatric: Normal mood and affect.   Laboratory Data: Lab Results  Component Value Date   WBC 6.6 05/03/2020   HGB 13.9  05/03/2020   HCT 41.9 05/03/2020   MCV 95.4 05/03/2020   PLT 311 05/03/2020    Lab Results  Component Value Date   CREATININE 0.91 05/03/2020    Lab Results  Component Value Date   HGBA1C 5.1 12/20/2019    Lab Results  Component Value Date   TSH 3.561 12/16/2019       Component Value Date/Time   CHOL 175 12/20/2019 0340   HDL 73 12/20/2019 0340   CHOLHDL 2.4 12/20/2019 0340   VLDL 11 12/20/2019 0340   LDLCALC 91 12/20/2019 0340    Lab Results  Component Value Date   AST 20 12/19/2019   Lab Results  Component Value Date   ALT 13 12/19/2019    Urinalysis Component     Latest Ref Rng & Units 06/23/2020  Specific Gravity, UA     1.005 - 1.030 1.015  pH, UA     5.0 - 7.5 6.5  Color, UA     Yellow Yellow  Appearance Ur     Clear Cloudy (A)  Leukocytes,UA     Negative 3+ (A)  Protein,UA     Negative/Trace Trace (A)  Glucose, UA     Negative Negative  Ketones, UA     Negative Negative  RBC, UA     Negative 1+ (A)  Bilirubin, UA     Negative Negative  Urobilinogen, Ur     0.2 - 1.0 mg/dL 0.2  Nitrite, UA     Negative Negative  Microscopic Examination      See below:   Component     Latest Ref Rng & Units 06/23/2020  WBC, UA     0 - 5 /hpf >30 (A)  RBC     0 - 2 /hpf 0-2  Epithelial Cells (non renal)     0 - 10 /hpf 0-10  Renal Epithel, UA     None seen /hpf 0-10 (A)  Bacteria, UA     None seen/Few Few  I have reviewed the labs.   Pertinent Imaging: No recent imaging  Assessment & Plan:    1. Cloudy urine  -UA with pyuria -Urine sent for culture -started on Macrobid 100 mg, BID x 7 days -We will adjust if necessary once culture and sensitivities are available  2. Left flank pain -likely secondary to UTI -will reassess once UTI  is treated  Return for pending urine cutlure results .  These notes generated with voice recognition software. I apologize for typographical errors.  Zara Council, [name]  [facility] [address]  [place], [place] [address] [phone]

## 2020-06-23 ENCOUNTER — Other Ambulatory Visit: Payer: Self-pay | Admitting: Urology

## 2020-06-23 ENCOUNTER — Other Ambulatory Visit: Payer: Self-pay

## 2020-06-23 ENCOUNTER — Ambulatory Visit: Payer: Medicare Other | Admitting: Urology

## 2020-06-23 ENCOUNTER — Encounter: Payer: Self-pay | Admitting: Urology

## 2020-06-23 VITALS — BP 158/78 | HR 85 | Ht 63.0 in | Wt 140.0 lb

## 2020-06-23 DIAGNOSIS — R8290 Unspecified abnormal findings in urine: Secondary | ICD-10-CM

## 2020-06-23 DIAGNOSIS — R109 Unspecified abdominal pain: Secondary | ICD-10-CM | POA: Diagnosis not present

## 2020-06-23 LAB — MICROSCOPIC EXAMINATION: WBC, UA: >30 /hpf — AB (ref 0–5)

## 2020-06-23 LAB — URINALYSIS, COMPLETE
Bilirubin, UA: NEGATIVE
Glucose, UA: NEGATIVE
Ketones, UA: NEGATIVE
Nitrite, UA: NEGATIVE
Specific Gravity, UA: 1.015 (ref 1.005–1.030)
Urobilinogen, Ur: 0.2 mg/dL (ref 0.2–1.0)
pH, UA: 6.5 (ref 5.0–7.5)

## 2020-06-23 MED ORDER — NITROFURANTOIN MONOHYD MACRO 100 MG PO CAPS: 100.0000 mg | ORAL_CAPSULE | Freq: Two times a day (BID) | ORAL | 0 refills | Status: DC

## 2020-06-26 LAB — CULTURE, URINE COMPREHENSIVE

## 2020-06-27 ENCOUNTER — Telehealth: Payer: Self-pay

## 2020-06-27 NOTE — Telephone Encounter (Signed)
-----   Message from Nori Riis, [name] sent at 06/27/2020  8:14 AM EDT ----- Please let [name] and her daughter, [name], know that her urine culture was positive for infection and that the [place] is the appropriate antibiotic.

## 2020-06-27 NOTE — Telephone Encounter (Signed)
Patient's daughter notified and confirmed

## 2020-07-14 ENCOUNTER — Other Ambulatory Visit: Payer: Self-pay | Admitting: Internal Medicine

## 2020-07-26 ENCOUNTER — Other Ambulatory Visit: Payer: Self-pay

## 2020-07-26 ENCOUNTER — Ambulatory Visit: Payer: Medicare Other | Admitting: Dermatology

## 2020-07-26 DIAGNOSIS — L570 Actinic keratosis: Secondary | ICD-10-CM | POA: Diagnosis not present

## 2020-07-26 DIAGNOSIS — D492 Neoplasm of unspecified behavior of bone, soft tissue, and skin: Secondary | ICD-10-CM

## 2020-07-26 DIAGNOSIS — L820 Inflamed seborrheic keratosis: Secondary | ICD-10-CM

## 2020-07-26 DIAGNOSIS — C44622 Squamous cell carcinoma of skin of right upper limb, including shoulder: Secondary | ICD-10-CM | POA: Diagnosis not present

## 2020-07-26 DIAGNOSIS — L578 Other skin changes due to chronic exposure to nonionizing radiation: Secondary | ICD-10-CM | POA: Diagnosis not present

## 2020-07-26 NOTE — Patient Instructions (Addendum)
If you have any questions or concerns for your doctor, please call our main line at [phone] and press option 4 to reach your doctor's medical assistant. If no one answers, please leave a voicemail as directed and we will return your call as soon as possible. Messages left after 4 pm will be answered the following business day.   You may also send us a message via MyChart. We typically respond to MyChart messages within 1-2 business days.  For prescription refills, please ask your pharmacy to contact our office. Our fax number is [phone].  If you have an urgent issue when the clinic is closed that cannot wait until the next business day, you can page your doctor at the number below.    Please note that while we do our best to be available for urgent issues outside of office hours, we are not available 24/7.   If you have an urgent issue and are unable to reach us, you may choose to seek medical care at your doctor's office, retail clinic, urgent care center, or emergency room.  If you have a medical emergency, please immediately call 911 or go to the emergency department.  Pager Numbers  - Dr. Kowalski: [phone]  - Dr. Moye: [phone]  - Dr. Stewart: [phone]  In the event of inclement weather, please call our main line at [phone] for an update on the status of any delays or closures.  Dermatology Medication Tips: Please keep the boxes that topical medications come in in order to help keep track of the instructions about where and how to use these. Pharmacies typically print the medication instructions only on the boxes and not directly on the medication tubes.   If your medication is too expensive, please contact our office at [phone] option 4 or send us a message through MyChart.   We are unable to tell what your co-pay for medications will be in advance as this is different depending on your insurance coverage. However, we may be able to find a substitute  medication at lower cost or fill out paperwork to get insurance to cover a needed medication.   If a prior authorization is required to get your medication covered by your insurance company, please allow us 1-2 business days to complete this process.  Drug prices often vary depending on where the prescription is filled and some pharmacies may offer cheaper prices.  The website www.goodrx.com contains coupons for medications through different pharmacies. The prices here do not account for what the cost may be with help from insurance (it may be cheaper with your insurance), but the website can give you the price if you did not use any insurance.  - You can print the associated coupon and take it with your prescription to the pharmacy.  - You may also stop by our office during regular business hours and pick up a GoodRx coupon card.  - If you need your prescription sent electronically to a different pharmacy, notify our office through [facility] MyChart or by phone at [phone] option 4.   Wound Care Instructions  Cleanse wound gently with soap and water once a day then pat dry with clean gauze. Apply a thing coat of Petrolatum (petroleum jelly, "Vaseline") over the wound (unless you have an allergy to this). We recommend that you use a new, sterile tube of Vaseline. Do not pick or remove scabs. Do not remove the yellow or white "healing tissue" from the base of the wound.  Cover the   wound with fresh, clean, nonstick gauze and secure with paper tape. You may use Band-Aids in place of gauze and tape if the would is small enough, but would recommend trimming much of the tape off as there is often too much. Sometimes Band-Aids can irritate the skin.  You should call the office for your biopsy report after 1 week if you have not already been contacted.  If you experience any problems, such as abnormal amounts of bleeding, swelling, significant bruising, significant pain, or evidence of infection,  please call the office immediately.  FOR ADULT SURGERY PATIENTS: If you need something for pain relief you may take 1 extra strength Tylenol (acetaminophen) AND 2 Ibuprofen (200mg each) together every 4 hours as needed for pain. (do not take these if you are allergic to them or if you have a reason you should not take them.) Typically, you may only need pain medication for 1 to 3 days.    

## 2020-07-26 NOTE — Progress Notes (Signed)
Follow-Up Visit   Subjective  Monica Singh is a 87 y.o. female who presents for the following: check spot (R hand, 30m, tender). She has several other areas to be evaluated today.  Patient accompanied by daughter who contributes to history.  The following portions of the chart were reviewed this encounter and updated as appropriate:   Tobacco  Allergies  Meds  Problems  Med Hx  Surg Hx  Fam Hx      Review of Systems:  No other skin or systemic complaints except as noted in HPI or Assessment and Plan.  Objective  Well appearing patient in no apparent distress; mood and affect are within normal limits.  A focused examination was performed including R hand, face. Relevant physical exam findings are noted in the Assessment and Plan.  Right hand 1.2 hyperkeratotic pap  L mandible x 1, Total = 1 Erythematous keratotic or waxy stuck-on papule or plaque.   face x 4 (4) Pink scaly macules    Assessment & Plan  Neoplasm of skin Right hand  Epidermal / dermal shaving  Lesion diameter (cm):  1.2 Informed consent: discussed and consent obtained   Timeout: patient name, date of birth, surgical site, and procedure verified   Procedure prep:  Patient was prepped and draped in usual sterile fashion Prep type:  Isopropyl alcohol Anesthesia: the lesion was anesthetized in a standard fashion   Anesthetic:  1% lidocaine w/ epinephrine 1-100,000 buffered w/ 8.4% NaHCO3 Instrument used: flexible razor blade   Hemostasis achieved with: pressure, aluminum chloride and electrodesiccation   Outcome: patient tolerated procedure well   Post-procedure details: sterile dressing applied and wound care instructions given   Dressing type: bandage and bacitracin    Destruction of lesion Complexity: extensive   Destruction method: electrodesiccation and curettage   Informed consent: discussed and consent obtained   Timeout:  patient name, date of birth, surgical site, and procedure  verified Procedure prep:  Patient was prepped and draped in usual sterile fashion Prep type:  Isopropyl alcohol Anesthesia: the lesion was anesthetized in a standard fashion   Anesthetic:  1% lidocaine w/ epinephrine 1-100,000 buffered w/ 8.4% NaHCO3 Curettage performed in three different directions: Yes   Electrodesiccation performed over the curetted area: Yes   Lesion length (cm):  1.2 Lesion width (cm):  1.2 Margin per side (cm):  0.2 Final wound size (cm):  1.6 Hemostasis achieved with:  pressure, aluminum chloride and electrodesiccation Outcome: patient tolerated procedure well with no complications   Post-procedure details: sterile dressing applied and wound care instructions given   Dressing type: bandage and bacitracin    Specimen 1 - Surgical pathology Differential Diagnosis: D48.5 R/O SCC  Check Margins: No 1.2 hyperkeratotic pap EDC today  Inflamed seborrheic keratosis L mandible x 1, Total = 1  Destruction of lesion - L mandible x 1, Total = 1 Complexity: simple   Destruction method: cryotherapy   Informed consent: discussed and consent obtained   Timeout:  patient name, date of birth, surgical site, and procedure verified Lesion destroyed using liquid nitrogen: Yes   Region frozen until ice ball extended beyond lesion: Yes   Outcome: patient tolerated procedure well with no complications   Post-procedure details: wound care instructions given    AK (actinic keratosis) (4) face x 4  Destruction of lesion - face x 4 Complexity: simple   Destruction method: cryotherapy   Informed consent: discussed and consent obtained   Timeout:  patient name, date of birth, surgical site,  and procedure verified Lesion destroyed using liquid nitrogen: Yes   Region frozen until ice ball extended beyond lesion: Yes   Outcome: patient tolerated procedure well with no complications   Post-procedure details: wound care instructions given    Actinic Damage - chronic, secondary  to cumulative UV radiation exposure/sun exposure over time - diffuse scaly erythematous macules with underlying dyspigmentation - Recommend daily broad spectrum sunscreen SPF 30+ to sun-exposed areas, reapply every 2 hours as needed.  - Recommend staying in the shade or wearing long sleeves, sun glasses (UVA+UVB protection) and wide brim hats (4-inch brim around the entire circumference of the hat). - Call for new or changing lesions.  Return for as scheduled for TBSE.  I, Othelia Pulling, RMA, am acting as scribe for Sarina Ser, MD . Documentation: I have reviewed the above documentation for accuracy and completeness, and I agree with the above.  Sarina Ser, MD

## 2020-08-03 ENCOUNTER — Telehealth: Payer: Self-pay

## 2020-08-03 NOTE — Telephone Encounter (Signed)
-----   Message from Brendolyn Patty, MD sent at 08/02/2020  8:50 PM EDT ----- Skin , right hand WELL DIFFERENTIATED SQUAMOUS CELL CARCINOMA, ACANTHOLYTIC (ADENOID) VARIANT -   SCC skin cancer- already treated with EDC at time of biopsy please call pt

## 2020-08-03 NOTE — Telephone Encounter (Signed)
Advised patient's daughter of results/hd 

## 2020-08-05 ENCOUNTER — Encounter: Payer: Self-pay | Admitting: Dermatology

## 2020-10-11 ENCOUNTER — Ambulatory Visit: Payer: Medicare Other | Admitting: Urology

## 2020-10-12 ENCOUNTER — Encounter: Payer: Self-pay | Admitting: Urology

## 2020-10-17 ENCOUNTER — Other Ambulatory Visit: Payer: Self-pay

## 2020-10-17 ENCOUNTER — Ambulatory Visit: Payer: Medicare Other | Admitting: Urology

## 2020-10-17 ENCOUNTER — Encounter: Payer: Self-pay | Admitting: Urology

## 2020-10-17 VITALS — BP 124/71 | HR 91 | Temp 98.1°F | Ht 63.0 in | Wt 138.0 lb

## 2020-10-17 DIAGNOSIS — N390 Urinary tract infection, site not specified: Secondary | ICD-10-CM | POA: Diagnosis not present

## 2020-10-17 DIAGNOSIS — M545 Low back pain, unspecified: Secondary | ICD-10-CM

## 2020-10-17 DIAGNOSIS — R3129 Other microscopic hematuria: Secondary | ICD-10-CM | POA: Diagnosis not present

## 2020-10-17 LAB — BLADDER SCAN AMB NON-IMAGING

## 2020-10-17 MED ORDER — NITROFURANTOIN MONOHYD MACRO 100 MG PO CAPS: 100.0000 mg | ORAL_CAPSULE | Freq: Two times a day (BID) | ORAL | 0 refills | Status: DC

## 2020-10-17 NOTE — Progress Notes (Signed)
10/17/2020 2:17 PM   [place] May 27, 1928 [id number]  Referring provider: Birdie Sons, MD [address] [place],  [place] [address]  Chief Complaint  Patient presents with   Recurrent UTI   Urological history: 1. Left UPJ obstruction -creatinine 0.91 in 04/2020  -managed with an indwelling Optima Bard ureteral stent exchanged yearly -last exchange 04/2020 -Diagnostic ureteroscopy in January 2020 did not show any concerning lesions and cytology was negative  2. rUTI's -contributing factors of age and vaginal atrophy -documented positive urine cultures over the last year  Enerococcus faecalis 06/23/2020 resistant to tetracycline   Aerococcus urinae 04/21/2020  Kocuria species 04/04/2020  Klebsiella pneumoniae resistant to ampicillin 10/16/2019  Staphylococcus epidermidis and viridans streptococcus group resistant to fluoroquinolones and PCN 09/06/2019  Citrobacter freundi resistant to cefuroxime, ampicillin and cefazolin on 08/20/2019     HPI: Monica Singh is a 87 y.o. female who presents with cloudy urine and lower back pain.  She has been experiencing 3 days of cloudy urine associated with lower back pain and suprapubic pain.  Patient denies any modifying or aggravating factors.  Patient denies any gross hematuria, dysuria or suprapubic/flank pain.  Patient denies any fevers, chills, nausea or vomiting.    She did state that she does have mostly clear urine.  UA > 30 WBC's, > 30 RBC's and many bacteria.    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 lat. elbow. SCCis arising in [place]. Excised 01/04/2009, margins free.    Squamous cell carcinoma of skin 07/26/2020   right hand    Surgical History: Past Surgical History:  Procedure Laterality Date   ABDOMINAL SURGERY  2009   abdominal mass+ NH lymphoma,   BACK SURGERY  01/2017   fusion. metal plate in neck at back   [place]   right eye and left ey   cervical neck fusion  1995   CYSTOSCOPY W/ RETROGRADES Left 01/30/2018   Procedure: CYSTOSCOPY WITH RETROGRADE PYELOGRAM;  Surgeon: Billey Co, MD;  Location: [facility];  Service: Urology;  Laterality: Left;   CYSTOSCOPY W/ RETROGRADES Left 08/05/2018   Procedure: CYSTOSCOPY WITH RETROGRADE PYELOGRAM;  Surgeon: Billey Co, MD;  Location: [facility];  Service: Urology;  Laterality: Left;   CYSTOSCOPY W/ URETERAL STENT PLACEMENT Left 08/05/2018   Procedure: CYSTOSCOPY WITH STENT Exchange;  Surgeon: Billey Co, MD;  Location: [facility];  Service: Urology;  Laterality: Left;   CYSTOSCOPY W/ URETERAL STENT PLACEMENT Left 05/21/2019   Procedure: CYSTOSCOPY WITH STENT REPLACEMENT;  Surgeon: Billey Co, MD;  Location: [facility];  Service: Urology;  Laterality: Left;  CYSTOSCOPY W/ URETERAL STENT PLACEMENT Left 05/05/2020   Procedure: CYSTOSCOPY WITH RETROGRADE PYELOGRAM/URETERAL STENT EXCHANGE;  Surgeon: Billey Co, MD;  Location: [facility];  Service: Urology;  Laterality: Left;   CYSTOSCOPY WITH BIOPSY Left 02/27/2018   Procedure: CYSTOSCOPY WITH BIOPSY;  Surgeon: Billey Co, MD;  Location: [facility];  Service: Urology;  Laterality: Left;   CYSTOSCOPY WITH STENT PLACEMENT Left 01/30/2018    Procedure: CYSTOSCOPY WITH STENT PLACEMENT;  Surgeon: Billey Co, MD;  Location: [facility];  Service: Urology;  Laterality: Left;   CYSTOSCOPY WITH URETEROSCOPY AND STENT PLACEMENT Left 02/27/2018   Procedure: CYSTOSCOPY WITH URETEROSCOPY AND STENT Exchange;  Surgeon: Billey Co, MD;  Location: [facility];  Service: Urology;  Laterality: Left;   DENTAL SURGERY     screws   JOINT REPLACEMENT Left    knee   KNEE ARTHROPLASTY Right 12/23/2018   Procedure: RIGHT COMPUTER ASSISTED TOTAL KNEE ARTHROPLASTY;  Surgeon: Dereck Leep, MD;  Location: [facility];  Service: Orthopedics;  Laterality: Right;   KYPHOPLASTY N/A 02/21/2017   Procedure: [id number];  Surgeon: Hessie Knows, MD;  Location: [facility];  Service: Orthopedics;  Laterality: N/A;   laparotomy with biopsy  03/02/2007   PORTACATH PLACEMENT  2009   [place]   SQUAMOUS CELL CARCINOMA EXCISION     right arm   TOTAL KNEE ARTHROPLASTY Left    2009   URETEROSCOPY Left 01/30/2018   Procedure: URETEROSCOPY;  Surgeon: Billey Co, MD;  Location: [facility];  Service: Urology;  Laterality: Left;   VAGINAL HYSTERECTOMY  1971    Home Medications:  Allergies as of 10/17/2020       Reactions   Milk-related Compounds Diarrhea, Nausea And Vomiting   Gas too   Lac Bovis Diarrhea, Nausea And Vomiting   Milk allergy   Sulfa Antibiotics Hives, Itching        Medication List        Accurate as of October 17, 2020  2:17 PM. If you have any questions, ask your nurse or doctor.          STOP taking these medications    cyanocobalamin 1000 MCG tablet Stopped by: Calah Gershman, [name]   Vitamin D (Ergocalciferol) 1.25 MG (50000 UNIT) Caps capsule Commonly known as: DRISDOL Stopped by: Zara Council, [name]       TAKE these medications    celecoxib 100 MG capsule Commonly known as: CELEBREX Take 1 capsule by mouth 2 (two) times daily.   Combivent Respimat 20-100 MCG/ACT Aers respimat Generic drug:  Ipratropium-Albuterol Inhale 1 puff into the lungs every 8 (eight) hours as needed for wheezing.   folic acid 1 MG tablet Commonly known as: FOLVITE Take 1 tablet (1 mg total) by mouth daily.   ibuprofen 200 MG tablet Commonly known as: ADVIL Take 400 mg by mouth every 6 (six) hours as needed for moderate pain.   nitrofurantoin (macrocrystal-monohydrate) 100 MG capsule Commonly known as: MACROBID Take 1 capsule (100 mg total) by mouth every 12 (twelve) hours.   rosuvastatin 5 MG tablet Commonly known as: CRESTOR TAKE 1 TABLET BY MOUTH DAILY        Allergies:  Allergies  Allergen Reactions   Milk-Related Compounds Diarrhea and Nausea And Vomiting    Gas too   Lac Bovis Diarrhea and Nausea And Vomiting    Milk allergy    Sulfa Antibiotics Hives and Itching    Family History: Family History  Problem Relation Age of Onset  Breast cancer Sister    Dementia Sister    Cataracts Sister    Heart attack Brother    CAD Brother    Heart attack Brother    Leukemia Grandchild        granddaughter   Kidney disease Neg Hx    Bladder Cancer Neg Hx     Social History:  reports that she quit smoking about 31 years ago. Her smoking use included cigarettes. She smoked an average of .25 packs per day. She has never used smokeless tobacco. She reports current alcohol use of about 14.0 standard drinks per week. She reports that she does not use drugs.  ROS: Pertinent ROS in HPI  Physical Exam: BP 124/71 (BP Location: Left Arm, Patient Position: Sitting, Cuff Size: Normal)   Pulse 91   Temp 98.1 F (36.7 C) (Oral)   Ht 5\' 3"  (1.6 m)   Wt 138 lb (62.6 kg)   BMI 24.45 kg/m   Constitutional:  Well nourished. Alert and oriented, No acute distress. HEENT: [place] AT, mask in place.  Trachea midline Cardiovascular: No clubbing, cyanosis, or edema. Respiratory: Normal respiratory effort, no increased work of breathing. Neurologic: Grossly intact, no focal deficits, moving all 4  extremities. Psychiatric: Normal mood and affect.   Laboratory Data: Urinalysis Component     Latest Ref Rng & Units 10/17/2020  Specific Gravity, UA     1.005 - 1.030 1.020  pH, UA     5.0 - 7.5 7.0  Color, UA     Yellow Yellow  Appearance Ur     Clear Cloudy (A)  Leukocytes,UA     Negative 3+ (A)  Protein,UA     Negative/Trace 3+ (A)  Glucose, UA     Negative Negative  Ketones, UA     Negative 1+ (A)  RBC, UA     Negative 3+ (A)  Bilirubin, UA     Negative Negative  Urobilinogen, Ur     0.2 - 1.0 mg/dL 0.2  Nitrite, UA     Negative Negative  Microscopic Examination      See below:   Component     Latest Ref Rng & Units 10/17/2020  WBC, UA     0 - 5 /hpf >30 (A)  RBC     0 - 2 /hpf >30 (A)  Epithelial Cells (non renal)     0 - 10 /hpf 0-10  Casts     None seen /lpf Present (A)  Cast Type     N/A Granular casts (A)  Crystals     N/A Present (A)  Crystal Type     N/A Amorphous Sediment  Bacteria, UA     None seen/Few Many (A)   I have reviewed the labs.   Pertinent Imaging: Results for Monica, Singh (MRN [id number]) as of 10/17/2020 14:14  Ref. Range 10/17/2020 13:51  Scan Result Unknown 90mL    Assessment & Plan:    1. Cloudy urine  -UA grossly infected -urine culture -Macrobid 100 mg, sent to pharmacy -will adjust if necessary once culture results are available  2. Lower back pain -likely secondary to UTI -will reassess once UTI is treated  3. Microscopic hematuria -likely secondary to UTI -will recheck urine in one month  Return in about 1 month (around 11/16/2020) for symptom and UA recheck .  These notes generated with voice recognition software. I apologize for typographical errors.  Zara Council, [facility] [address]  [address] [place], [place] [address] [phone]  

## 2020-10-18 LAB — MICROSCOPIC EXAMINATION
RBC, Urine: >30 /hpf — AB (ref 0–2)
WBC, UA: >30 /hpf — AB (ref 0–5)

## 2020-10-18 LAB — URINALYSIS, COMPLETE
Bilirubin, UA: NEGATIVE
Glucose, UA: NEGATIVE
Nitrite, UA: NEGATIVE
Specific Gravity, UA: 1.02 (ref 1.005–1.030)
Urobilinogen, Ur: 0.2 mg/dL (ref 0.2–1.0)
pH, UA: 7 (ref 5.0–7.5)

## 2020-10-20 LAB — CULTURE, URINE COMPREHENSIVE

## 2020-11-06 ENCOUNTER — Other Ambulatory Visit: Payer: Self-pay | Admitting: Family Medicine

## 2020-11-06 DIAGNOSIS — Z1231 Encounter for screening mammogram for malignant neoplasm of breast: Secondary | ICD-10-CM

## 2020-11-15 NOTE — Progress Notes (Signed)
11/16/2020 8:27 PM   [place] [birth year] [id number]  Referring provider: Birdie Sons, MD [address] [place],  [place] [address]  Chief Complaint  Patient presents with   Follow-up    Urological history: 1. Left UPJ obstruction -creatinine 0.91 in 04/2020  -managed with an indwelling Optima Bard ureteral stent exchanged yearly -last exchange 04/2020 -Diagnostic ureteroscopy in January 2020 did not show any concerning lesions and cytology was negative  2. rUTI's -contributing factors of age and vaginal atrophy -documented positive urine cultures over the last year  Enerococcus faecalis 06/23/2020 resistant to tetracycline   Aerococcus urinae 04/21/2020  Kocuria species 04/04/2020  Klebsiella pneumoniae resistant to ampicillin 10/16/2019  Staphylococcus epidermidis and viridans streptococcus group resistant to fluoroquinolones and PCN 09/06/2019  Citrobacter freundi resistant to cefuroxime, ampicillin and cefazolin on 08/20/2019     HPI: [place] H Don[place]rio is a 87 y.o. female who presents for recheck on UTI with her daughter, [name].      She presented 10/17/2020 with symptoms of 3 days of cloudy urine associated with lower back pain and suprapubic pain.  Urine culture grew out MUF.    UA nitrite positive, > 30 WBC's, 11-30 RBC's and many bacteria.    KUB demonstrates stent in good position.   She continues to experience cloudy urine with left flank pain.  Patient denies any modifying or aggravating factors.  Patient denies any gross hematuria, dysuria or suprapubic.  Patient denies any fevers, chills, nausea or vomiting.    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 lat. elbow. SCCis arising in [place]. Excised 01/04/2009, margins free.    Squamous cell carcinoma of skin 07/26/2020   right hand    Surgical History: Past Surgical History:  Procedure Laterality Date   ABDOMINAL SURGERY  2009   abdominal mass+ NH lymphoma,   BACK SURGERY  01/2017   fusion. metal plate in neck at back   [place]   right eye and left ey   cervical neck fusion  1995   CYSTOSCOPY W/ RETROGRADES Left 01/30/2018   Procedure: CYSTOSCOPY WITH RETROGRADE PYELOGRAM;  Surgeon: Billey Co, MD;  Location: [facility];  Service: Urology;  Laterality: Left;   CYSTOSCOPY W/ RETROGRADES Left 08/05/2018   Procedure: CYSTOSCOPY WITH RETROGRADE PYELOGRAM;  Surgeon: Billey Co, MD;  Location: [facility];  Service: Urology;  Laterality: Left;   CYSTOSCOPY W/ URETERAL STENT PLACEMENT Left 08/05/2018   Procedure: CYSTOSCOPY WITH STENT Exchange;  Surgeon: Billey Co, MD;  Location: [facility];  Service: Urology;  Laterality: Left;   CYSTOSCOPY W/ URETERAL STENT PLACEMENT Left 05/21/2019   Procedure: CYSTOSCOPY  WITH STENT REPLACEMENT;  Surgeon: Billey Co, MD;  Location: [facility];  Service: Urology;  Laterality: Left;   CYSTOSCOPY W/ URETERAL STENT PLACEMENT Left 05/05/2020   Procedure: CYSTOSCOPY WITH RETROGRADE PYELOGRAM/URETERAL STENT EXCHANGE;  Surgeon: Billey Co, MD;  Location: [facility];  Service: Urology;  Laterality: Left;   CYSTOSCOPY WITH BIOPSY Left 02/27/2018   Procedure: CYSTOSCOPY WITH BIOPSY;  Surgeon: Billey Co, MD;  Location:  [facility];  Service: Urology;  Laterality: Left;   CYSTOSCOPY WITH STENT PLACEMENT Left 01/30/2018   Procedure: CYSTOSCOPY WITH STENT PLACEMENT;  Surgeon: Billey Co, MD;  Location: [facility];  Service: Urology;  Laterality: Left;   CYSTOSCOPY WITH URETEROSCOPY AND STENT PLACEMENT Left 02/27/2018   Procedure: CYSTOSCOPY WITH URETEROSCOPY AND STENT Exchange;  Surgeon: Billey Co, MD;  Location: [facility];  Service: Urology;  Laterality: Left;   DENTAL SURGERY     screws   JOINT REPLACEMENT Left    knee   KNEE ARTHROPLASTY Right 12/23/2018   Procedure: RIGHT COMPUTER ASSISTED TOTAL KNEE ARTHROPLASTY;  Surgeon: Dereck Leep, MD;  Location: [facility];  Service: Orthopedics;  Laterality: Right;   KYPHOPLASTY N/A 02/21/2017   Procedure: [id number];  Surgeon: Hessie Knows, MD;  Location: [facility];  Service: Orthopedics;  Laterality: N/A;   laparotomy with biopsy  03/02/2007   PORTACATH PLACEMENT  2009   [place]   SQUAMOUS CELL CARCINOMA EXCISION     right arm   TOTAL KNEE ARTHROPLASTY Left    2009   URETEROSCOPY Left 01/30/2018   Procedure: URETEROSCOPY;  Surgeon: Billey Co, MD;  Location: [facility];  Service: Urology;  Laterality: Left;   VAGINAL HYSTERECTOMY  1971    Home Medications:  Allergies as of 11/16/2020       Reactions   Milk-related Compounds Diarrhea, Nausea And Vomiting   Gas too   Lac Bovis Diarrhea, Nausea And Vomiting   Milk allergy   Sulfa Antibiotics Hives, Itching        Medication List        Accurate as of November 16, 2020 11:59 PM. If you have any questions, ask your nurse or doctor.          celecoxib 100 MG capsule Commonly known as: CELEBREX Take 1 capsule by mouth 2 (two) times daily.   Combivent Respimat 20-100 MCG/ACT Aers respimat Generic drug: Ipratropium-Albuterol Inhale 1 puff into the lungs every 8 (eight) hours as needed for wheezing.   folic acid 1 MG tablet Commonly known as: FOLVITE Take 1 tablet (1 mg  total) by mouth daily.   ibuprofen 200 MG tablet Commonly known as: ADVIL Take 400 mg by mouth every 6 (six) hours as needed for moderate pain.   nitrofurantoin (macrocrystal-monohydrate) 100 MG capsule Commonly known as: MACROBID Take 1 capsule (100 mg total) by mouth every 12 (twelve) hours.   rosuvastatin 5 MG tablet Commonly known as: CRESTOR TAKE 1 TABLET BY MOUTH DAILY        Allergies:  Allergies  Allergen Reactions   Milk-Related Compounds Diarrhea and Nausea And Vomiting    Gas too   Lac Bovis Diarrhea and Nausea And Vomiting    Milk allergy    Sulfa Antibiotics Hives and Itching    Family History: Family History  Problem Relation Age of Onset   Breast cancer Sister    Dementia Sister    Cataracts Sister    Heart attack Brother    CAD Brother  Heart attack Brother    Leukemia Grandchild        granddaughter   Kidney disease Neg Hx    Bladder Cancer Neg Hx     Social History:  reports that she quit smoking about 31 years ago. Her smoking use included cigarettes. She smoked an average of .25 packs per day. She has never used smokeless tobacco. She reports current alcohol use of about 14.0 standard drinks per week. She reports that she does not use drugs.  ROS: Pertinent ROS in HPI  Physical Exam: BP (!) 144/76   Pulse 80   Ht 5\' 5"  (1.651 m)   Wt 138 lb (62.6 kg)   BMI 22.96 kg/m   Constitutional:  Well nourished. Alert and oriented, No acute distress. HEENT: [place] AT, mask in place.  Trachea midline Cardiovascular: No clubbing, cyanosis, or edema. Respiratory: Normal respiratory effort, no increased work of breathing. Neurologic: Grossly intact, no focal deficits, moving all 4 extremities. Psychiatric: Normal mood and affect.    Laboratory Data: Urinalysis Component     Latest Ref Rng & Units 11/16/2020  Specific Gravity, UA     1.005 - 1.030 1.020  pH, UA     5.0 - 7.5 7.0  Color, UA     Yellow Yellow  Appearance Ur     Clear Cloudy  (A)  Leukocytes,UA     Negative 3+ (A)  Protein,UA     Negative/Trace 2+ (A)  Glucose, UA     Negative Negative  Ketones, UA     Negative Negative  RBC, UA     Negative 2+ (A)  Bilirubin, UA     Negative Negative  Urobilinogen, Ur     0.2 - 1.0 mg/dL 0.2  Nitrite, UA     Negative Positive (A)  Microscopic Examination      See below:   Component     Latest Ref Rng & Units 11/16/2020  WBC, UA     0 - 5 /hpf >30 (A)  RBC     0 - 2 /hpf 11-30 (A)  Epithelial Cells (non renal)     0 - 10 /hpf 0-10  Casts     None seen /lpf Present (A)  Cast Type     N/A Hyaline casts  Bacteria, UA     None seen/Few Many (A)  I have reviewed the labs.   Pertinent Imaging: CLINICAL DATA:  Flank pain.  Microscopic hematuria.   EXAM: ABDOMEN - 1 VIEW   COMPARISON:  February 10, 2018   FINDINGS: The patient is status post vertebroplasty at L1. A left ureteral stent is in stable position. A left pelvic phlebolith is again identified, projecting adjacent to the stent. Scoliotic curvature of the lumbar spine. Moderate fecal loading throughout the colon. No other abnormalities are identified.   IMPRESSION: Moderate fecal loading. Left ureteral stent. No other acute abnormalities.     Electronically Signed   By: Dorise Bullion III M.D.   On: 11/17/2020 15:18 I have independently reviewed the films.  See HPI.    Assessment & Plan:    1. Cloudy urine  -last urine culture was positive for MUF -UA mostly unchanged from last visit -urine sent for culture as she continues to have flank pain -will not prescribe an antibiotic at this time  2. Lower back pain/left flank -may be secondary to MSK issues -will obtain RUS to rule out hydro  3. Microscopic hematuria -likely secondary to stent -RUS pending   Return  for pending urine cultures .  These notes generated with voice recognition software. I apologize for typographical errors.  Zara Council, [name]  [facility] [address]  [place], [place] [address] [phone]

## 2020-11-16 ENCOUNTER — Ambulatory Visit
Admission: RE | Admit: 2020-11-16 | Discharge: 2020-11-16 | Disposition: A | Discharge status: Home / Self Care | Payer: Medicare Other | Source: Ambulatory Visit | Attending: Urology | Admitting: Urology

## 2020-11-16 ENCOUNTER — Ambulatory Visit: Payer: Medicare Other | Admitting: Urology

## 2020-11-16 ENCOUNTER — Encounter: Payer: Self-pay | Admitting: Urology

## 2020-11-16 ENCOUNTER — Other Ambulatory Visit: Payer: Self-pay

## 2020-11-16 VITALS — BP 144/76 | HR 80 | Ht 65.0 in | Wt 138.0 lb

## 2020-11-16 DIAGNOSIS — R8290 Unspecified abnormal findings in urine: Secondary | ICD-10-CM

## 2020-11-16 DIAGNOSIS — R3129 Other microscopic hematuria: Secondary | ICD-10-CM

## 2020-11-16 DIAGNOSIS — R109 Unspecified abdominal pain: Secondary | ICD-10-CM | POA: Diagnosis not present

## 2020-11-17 LAB — URINALYSIS, COMPLETE
Bilirubin, UA: NEGATIVE
Glucose, UA: NEGATIVE
Ketones, UA: NEGATIVE
Nitrite, UA: POSITIVE — AB
Specific Gravity, UA: 1.02 (ref 1.005–1.030)
Urobilinogen, Ur: 0.2 mg/dL (ref 0.2–1.0)
pH, UA: 7 (ref 5.0–7.5)

## 2020-11-17 LAB — MICROSCOPIC EXAMINATION: WBC, UA: >30 /hpf — AB (ref 0–5)

## 2020-11-21 ENCOUNTER — Ambulatory Visit
Admission: RE | Admit: 2020-11-21 | Discharge: 2020-11-21 | Disposition: A | Discharge status: Home / Self Care | Payer: Medicare Other | Source: Ambulatory Visit | Attending: Family Medicine | Admitting: Family Medicine

## 2020-11-21 ENCOUNTER — Other Ambulatory Visit: Payer: Self-pay

## 2020-11-21 DIAGNOSIS — Z1231 Encounter for screening mammogram for malignant neoplasm of breast: Secondary | ICD-10-CM | POA: Insufficient documentation

## 2020-11-23 ENCOUNTER — Other Ambulatory Visit: Payer: Self-pay | Admitting: *Deleted

## 2020-11-23 LAB — CULTURE, URINE COMPREHENSIVE

## 2020-11-23 MED ORDER — CEFUROXIME AXETIL 500 MG PO TABS: 500.0000 mg | ORAL_TABLET | Freq: Two times a day (BID) | ORAL | 0 refills | Status: AC

## 2020-11-24 ENCOUNTER — Other Ambulatory Visit: Payer: Self-pay | Admitting: Family Medicine

## 2020-11-24 DIAGNOSIS — R928 Other abnormal and inconclusive findings on diagnostic imaging of breast: Secondary | ICD-10-CM

## 2020-11-24 DIAGNOSIS — N6489 Other specified disorders of breast: Secondary | ICD-10-CM

## 2020-12-07 ENCOUNTER — Other Ambulatory Visit: Payer: Medicare Other

## 2020-12-07 ENCOUNTER — Ambulatory Visit: Payer: Medicare Other

## 2020-12-08 ENCOUNTER — Emergency Department: Payer: Medicare Other

## 2020-12-08 ENCOUNTER — Emergency Department
Admission: EM | Admit: 2020-12-08 | Discharge: 2020-12-08 | Disposition: A | Discharge status: Home / Self Care | Payer: Medicare Other | Attending: Emergency Medicine | Admitting: Emergency Medicine

## 2020-12-08 ENCOUNTER — Other Ambulatory Visit: Payer: Self-pay

## 2020-12-08 ENCOUNTER — Encounter: Payer: Self-pay | Admitting: Emergency Medicine

## 2020-12-08 DIAGNOSIS — M169 Osteoarthritis of hip, unspecified: Secondary | ICD-10-CM | POA: Diagnosis not present

## 2020-12-08 DIAGNOSIS — Z743 Need for continuous supervision: Secondary | ICD-10-CM | POA: Diagnosis not present

## 2020-12-08 DIAGNOSIS — Z8572 Personal history of non-Hodgkin lymphomas: Secondary | ICD-10-CM | POA: Diagnosis not present

## 2020-12-08 DIAGNOSIS — M11252 Other chondrocalcinosis, left hip: Secondary | ICD-10-CM | POA: Diagnosis not present

## 2020-12-08 DIAGNOSIS — E02 Subclinical iodine-deficiency hypothyroidism: Secondary | ICD-10-CM | POA: Insufficient documentation

## 2020-12-08 DIAGNOSIS — Z87891 Personal history of nicotine dependence: Secondary | ICD-10-CM | POA: Diagnosis not present

## 2020-12-08 DIAGNOSIS — N189 Chronic kidney disease, unspecified: Secondary | ICD-10-CM | POA: Diagnosis not present

## 2020-12-08 DIAGNOSIS — R6889 Other general symptoms and signs: Secondary | ICD-10-CM | POA: Diagnosis not present

## 2020-12-08 DIAGNOSIS — M25552 Pain in left hip: Secondary | ICD-10-CM

## 2020-12-08 DIAGNOSIS — N83202 Unspecified ovarian cyst, left side: Secondary | ICD-10-CM | POA: Diagnosis not present

## 2020-12-08 DIAGNOSIS — M1612 Unilateral primary osteoarthritis, left hip: Secondary | ICD-10-CM | POA: Diagnosis not present

## 2020-12-08 DIAGNOSIS — Z85828 Personal history of other malignant neoplasm of skin: Secondary | ICD-10-CM | POA: Diagnosis not present

## 2020-12-08 DIAGNOSIS — M533 Sacrococcygeal disorders, not elsewhere classified: Secondary | ICD-10-CM | POA: Diagnosis not present

## 2020-12-08 DIAGNOSIS — R1111 Vomiting without nausea: Secondary | ICD-10-CM | POA: Diagnosis not present

## 2020-12-08 DIAGNOSIS — J449 Chronic obstructive pulmonary disease, unspecified: Secondary | ICD-10-CM | POA: Diagnosis not present

## 2020-12-08 DIAGNOSIS — Z96653 Presence of artificial knee joint, bilateral: Secondary | ICD-10-CM | POA: Insufficient documentation

## 2020-12-08 NOTE — ED Triage Notes (Signed)
Pt here via EMS with c/o left hip pain for about a week now, Denies fall or injury. No shortening, no rotation, this am, upon standing, pt had a vomiting episode when she got up to standing position due to pain, in recliner now. 164/72; 92; has a Dr appointment on Tuesday for the same. Pt is alert and oriented. Nad.

## 2020-12-08 NOTE — ED Triage Notes (Signed)
Left hip pain, denies injury. Alert and oriented. Daughter with pt.

## 2020-12-08 NOTE — ED Notes (Signed)
Pt ambulated independently with walker, w/o pain. Steady gait with walker. Family present.

## 2020-12-08 NOTE — ED Notes (Signed)
See triage note  presents with left hip pain  denies any fall hx of arthritis

## 2020-12-08 NOTE — ED Provider Notes (Signed)
[facility] Emergency Department Provider Note ____________________________________________  Time seen: Approximately 3:45 PM  I have reviewed the triage vital signs and the nursing notes.   HISTORY  Chief Complaint Hip Pain    HPI Monica Singh is a 87 y.o. female who presents to the emergency department for evaluation and treatment of left hip pain for the past week. No Monica injury. This morning, she was unable to bear weight due to pain. She has not tried since then. Pain not present at rest.    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 lat. elbow. SCCis arising in [place]. Excised 01/04/2009, margins free.    Squamous cell carcinoma of skin 07/26/2020   right hand    Patient Active Problem List   Diagnosis Date Noted   Bilateral carotid artery stenosis 05/27/2020   Staring episodes 12/19/2019   B12 deficiency 12/19/2019   Vitamin D deficiency 12/19/2019   Frequent UTI 12/19/2019   Syncope 12/16/2019   Total knee replacement status 12/23/2018   Obstruction of left ureteropelvic junction (UPJ) 02/26/2018   Sepsis ([place]) 02/10/2018   Closed burst fracture of lumbar vertebra with routine healing 04/01/2017   Chronic bronchitis ([place]) 02/18/2017   Osteoporosis 11/20/2015    COPD (chronic obstructive pulmonary disease) ([place]) 11/20/2015   Cyst of ovary 07/26/2015   Status post total left knee replacement 04/03/2015   Abnormal chest sounds 08/25/2014   Allergic rhinitis 08/25/2014   Colon polyp 08/25/2014   DDD (degenerative disc disease), cervical 08/25/2014   DDD (degenerative disc disease), lumbar 08/25/2014   H/O non-Hodgkin's lymphoma 08/25/2014   Below normal amount of sodium in the blood 08/25/2014   Primary osteoarthritis of right knee 08/25/2014   Subclinical hypothyroidism 08/25/2014   Adnexal mass 06/03/2014   Osteoarthritis 03/19/2011   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 metal plate in neck at back   [place]   right eye and left ey   cervical neck fusion  1995   CYSTOSCOPY W/ RETROGRADES Left 01/30/2018   Procedure: CYSTOSCOPY WITH RETROGRADE PYELOGRAM;  Surgeon: Billey Co, MD;  Location: [facility] ORS;  Service: Urology;  Laterality: Left;   CYSTOSCOPY W/ RETROGRADES Left 08/05/2018   Procedure: CYSTOSCOPY WITH RETROGRADE PYELOGRAM;  Surgeon: Billey Co, MD;  Location: [facility];  Service: Urology;  Laterality: Left;   CYSTOSCOPY W/ URETERAL STENT PLACEMENT Left 08/05/2018   Procedure: CYSTOSCOPY WITH STENT Exchange;  Surgeon: Billey Co,  MD;  Location: [facility];  Service: Urology;  Laterality: Left;   CYSTOSCOPY W/ URETERAL STENT PLACEMENT Left 05/21/2019   Procedure: CYSTOSCOPY WITH STENT REPLACEMENT;  Surgeon: Billey Co, MD;  Location: [facility];  Service: Urology;  Laterality: Left;   CYSTOSCOPY W/ URETERAL STENT PLACEMENT Left 05/05/2020   Procedure: CYSTOSCOPY WITH RETROGRADE PYELOGRAM/URETERAL STENT EXCHANGE;  Surgeon: Billey Co, MD;  Location: [facility];  Service: Urology;  Laterality: Left;   CYSTOSCOPY WITH BIOPSY  Left 02/27/2018   Procedure: CYSTOSCOPY WITH BIOPSY;  Surgeon: Billey Co, MD;  Location: [facility];  Service: Urology;  Laterality: Left;   CYSTOSCOPY WITH STENT PLACEMENT Left 01/30/2018   Procedure: CYSTOSCOPY WITH STENT PLACEMENT;  Surgeon: Billey Co, MD;  Location: [facility];  Service: Urology;  Laterality: Left;   CYSTOSCOPY WITH URETEROSCOPY AND STENT PLACEMENT Left 02/27/2018   Procedure: CYSTOSCOPY WITH URETEROSCOPY AND STENT Exchange;  Surgeon: Billey Co, MD;  Location: [facility];  Service: Urology;  Laterality: Left;   DENTAL SURGERY     screws   JOINT REPLACEMENT Left    knee   KNEE ARTHROPLASTY Right 12/23/2018   Procedure: RIGHT COMPUTER ASSISTED TOTAL KNEE ARTHROPLASTY;  Surgeon: Dereck Leep, MD;  Location: [facility];  Service: Orthopedics;  Laterality: Right;   KYPHOPLASTY N/A 02/21/2017   Procedure: [id number];  Surgeon: Hessie Knows, MD;  Location: [facility];  Service: Orthopedics;  Laterality: N/A;   laparotomy with biopsy  03/02/2007   PORTACATH PLACEMENT  2009   [place]   SQUAMOUS CELL CARCINOMA EXCISION     right arm   TOTAL KNEE ARTHROPLASTY Left    2009   URETEROSCOPY Left 01/30/2018   Procedure: URETEROSCOPY;  Surgeon: Billey Co, MD;  Location: [facility];  Service: Urology;  Laterality: Left;   VAGINAL HYSTERECTOMY  1971    Prior to Admission medications   Medication Sig Start Date End Date Taking? Authorizing Provider  celecoxib (CELEBREX) 100 MG capsule Take 1 capsule by mouth 2 (two) times daily. 06/05/20   [provider]  folic acid (FOLVITE) 1 MG tablet Take 1 tablet (1 mg total) by mouth daily. 12/18/19   Nicole Kindred A, DO  ibuprofen (ADVIL) 200 MG tablet Take 400 mg by mouth every 6 (six) hours as needed for moderate pain.    [provider]  Ipratropium-Albuterol (COMBIVENT RESPIMAT) 20-100 MCG/ACT AERS respimat Inhale 1 puff into the lungs every 8 (eight) hours as needed for wheezing.    [provider]  rosuvastatin (CRESTOR) 5 MG tablet TAKE 1 TABLET BY MOUTH DAILY 07/14/20   End, Harrell Gave, MD    Allergies Milk-related compounds, Lac bovis, and Sulfa antibiotics  Family History  Problem Relation Age of Onset   Breast cancer Sister    Dementia Sister    Cataracts Sister    Heart attack Brother    CAD Brother    Heart attack Brother    Leukemia Grandchild        granddaughter   Kidney disease Neg Hx    Bladder Cancer Neg Hx     Social History Social History   Tobacco Use   Smoking status: Former    Packs/day: 0.25    Types: Cigarettes    Quit date: 06/21/1989    Years since quitting: 31.4   Smokeless tobacco: Never  Vaping Use   Vaping Use: Never used  Substance Use Topics   Alcohol use: Yes    Alcohol/week: 14.0 standard  drinks    Types: 14 Glasses of wine per week    Comment: 2 glasses a night   Drug use: No    Review of Systems Constitutional: Negative for fever. Cardiovascular: Negative for chest pain. Respiratory: Negative for shortness of breath. Musculoskeletal: Positive for left hip pain. Skin: Negative for open wounds or lesions.  Neurological: Negative for decrease in sensation  ____________________________________________   PHYSICAL EXAM:  VITAL SIGNS: ED Triage Vitals  Enc Vitals Group     BP 12/08/20 1336 126/66     Pulse Rate 12/08/20 1336 91     Resp 12/08/20 1336 18     Temp 12/08/20 1336 98.3 F (36.8 C)     Temp src --      SpO2 12/08/20 1336 95 %     Weight 12/08/20 1329 138 lb (62.6 kg)     Height 12/08/20 1329 5\' 3"  (1.6 m)     Head Circumference --      Peak Flow --      Pain Score 12/08/20 1328 5     Pain Loc --      Pain Edu? --      Excl. in [place]? --     Constitutional: Alert and oriented. Well appearing and in no acute distress. Eyes: Conjunctivae are clear without discharge or drainage Head: Atraumatic Neck: Supple Respiratory: No cough. Respirations are even and unlabored. Musculoskeletal: No  focal tenderness over left hip or lower extremity. Neurologic: Motor and sensory function intact.  Skin: No contusions, abrasions, wounds.  Psychiatric: Affect and behavior are appropriate.  ____________________________________________   LABS (all labs ordered are listed, but only abnormal results are displayed)  Labs Reviewed - No data to display ____________________________________________  RADIOLOGY  Left hip x-ray negative.  Left hip CT negative for acute concerns. Mild osteoarthritis noted.  I, Sherrie George, personally viewed and evaluated these images (plain radiographs) as part of my medical decision making, as well as reviewing the written report by the radiologist.  CT Hip Left Wo Contrast  Result Date: 12/08/2020 CLINICAL DATA:  Hip pain, chronic, arthritis or infection suspected, xray done EXAM: CT OF THE LEFT HIP WITHOUT CONTRAST TECHNIQUE: Multidetector CT imaging of the left hip was performed according to the standard protocol. Multiplanar CT image reconstructions were also generated. COMPARISON:  Same day hip radiograph, pelvic ultrasound 03/29/2019, CT abdomen pelvis 02/10/2018 FINDINGS: Bones/Joint/Cartilage There is no evidence of acute hip fracture. There is mild left hip osteoarthritis. There is chondrocalcinosis and of the femoroacetabular joint. Mild findings of osteitis pubis. No focal bone lesion. Moderate left SI joint degenerative change. Ligaments Suboptimally assessed by CT. Muscles and Tendons No significant muscle atrophy.  No intramuscular collection. Soft tissues No focal fluid collection. There is a left cystic ovarian lesion with a coarse calcification posteriorly measuring up to 6.4 cm, previously up to 5.6 cm on prior ultrasound in March 2021. partially visualized ureteral stent. Sigmoid diverticulosis. IMPRESSION: No evidence of acute hip fracture. Mild left hip osteoarthritis with chondrocalcinosis. Cystic lesion in left ovary measures up to 6.4 cm,  previously up to 5.6 cm on prior ultrasound in March 2021. Given slow interval growth, recommend follow-up ultrasound in 3-6 months. Electronically Signed   By: Maurine Simmering M.D.   On: 12/08/2020 17:34   DG Hip Unilat W or Wo Pelvis 2-3 Views Left  Result Date: 12/08/2020 CLINICAL DATA:  Chronic left hip pain, worsened over the past week. No injury. EXAM: DG HIP (WITH OR WITHOUT PELVIS) 2-3V LEFT  COMPARISON:  CT abdomen pelvis dated February 10, 2018. FINDINGS: No acute fracture or dislocation. Unchanged mild bilateral hip degenerative changes with chondrocalcinosis. Left ureteral stent again noted. Soft tissues are unremarkable. IMPRESSION: 1. No acute osseous abnormality. Electronically Signed   By: Titus Dubin M.D.   On: 12/08/2020 14:07   ____________________________________________   PROCEDURES  Procedures  ____________________________________________   INITIAL IMPRESSION / ASSESSMENT AND PLAN / ED COURSE  Monica Singh is a 87 y.o. who presents to the emergency department for treatment and evaluation of left hip pain.  Daughter who is her caregiver states that it is likely just arthritis but because she was having so much difficulty bearing weight this morning she decided to bring her to the emergency department.  X-ray image of the left hip is negative for concerns.  Plan will be to get a CT.  CT of the left hip is negative for acute concerns.  She does have a cystic ovary that is progressively enlarging.  Otherwise, mild arthritis is noted.  Patient assisted out of the recliner by nursing staff.  She was able to bear weight using a walker without any pain.  Unsure what caused the pain earlier, however at this time she is pain-free.  She is to follow-up with her primary care provider or return to the emergency department for symptoms that change or worsen.  Medications - No data to display  Pertinent labs & imaging results that were available during my care of the patient  were reviewed by me and considered in my medical decision making (see chart for details).   _________________________________________   FINAL CLINICAL IMPRESSION(S) / ED DIAGNOSES  Final diagnoses:  Left hip pain    ED Discharge Orders     None        If controlled substance prescribed during this visit, 12 month history viewed on the [place] prior to issuing an initial prescription for Schedule II or III opiod.    Victorino Dike, FNP 12/08/20 1918    Duffy Bruce, MD 12/11/20 [phone]

## 2020-12-12 ENCOUNTER — Other Ambulatory Visit: Payer: Self-pay | Admitting: Orthopedic Surgery

## 2020-12-12 DIAGNOSIS — M5416 Radiculopathy, lumbar region: Secondary | ICD-10-CM | POA: Diagnosis not present

## 2020-12-12 DIAGNOSIS — M533 Sacrococcygeal disorders, not elsewhere classified: Secondary | ICD-10-CM | POA: Diagnosis not present

## 2020-12-14 ENCOUNTER — Ambulatory Visit
Admission: RE | Admit: 2020-12-14 | Discharge: 2020-12-14 | Disposition: A | Discharge status: Home / Self Care | Payer: Medicare Other | Source: Ambulatory Visit | Attending: Orthopedic Surgery | Admitting: Orthopedic Surgery

## 2020-12-14 ENCOUNTER — Other Ambulatory Visit: Payer: Self-pay

## 2020-12-14 ENCOUNTER — Ambulatory Visit
Admission: RE | Admit: 2020-12-14 | Discharge: 2020-12-14 | Disposition: A | Discharge status: Home / Self Care | Payer: Medicare Other | Source: Ambulatory Visit | Attending: Urology | Admitting: Urology

## 2020-12-14 DIAGNOSIS — M5416 Radiculopathy, lumbar region: Secondary | ICD-10-CM | POA: Insufficient documentation

## 2020-12-14 DIAGNOSIS — R3129 Other microscopic hematuria: Secondary | ICD-10-CM

## 2020-12-14 DIAGNOSIS — M545 Low back pain, unspecified: Secondary | ICD-10-CM | POA: Diagnosis not present

## 2020-12-14 DIAGNOSIS — R319 Hematuria, unspecified: Secondary | ICD-10-CM | POA: Diagnosis not present

## 2020-12-19 ENCOUNTER — Other Ambulatory Visit: Payer: Self-pay | Admitting: Internal Medicine

## 2020-12-19 NOTE — Telephone Encounter (Signed)
Please schedule overdue F/U appointment. Thank you! ?

## 2020-12-20 NOTE — Telephone Encounter (Signed)
Unable to schedule til feeling well

## 2020-12-28 DIAGNOSIS — Z96653 Presence of artificial knee joint, bilateral: Secondary | ICD-10-CM | POA: Diagnosis not present

## 2021-01-02 DIAGNOSIS — M48062 Spinal stenosis, lumbar region with neurogenic claudication: Secondary | ICD-10-CM | POA: Diagnosis not present

## 2021-01-02 DIAGNOSIS — M5416 Radiculopathy, lumbar region: Secondary | ICD-10-CM | POA: Diagnosis not present

## 2021-01-24 DIAGNOSIS — M48062 Spinal stenosis, lumbar region with neurogenic claudication: Secondary | ICD-10-CM | POA: Diagnosis not present

## 2021-01-24 DIAGNOSIS — M5416 Radiculopathy, lumbar region: Secondary | ICD-10-CM | POA: Diagnosis not present

## 2021-01-24 DIAGNOSIS — M5136 Other intervertebral disc degeneration, lumbar region: Secondary | ICD-10-CM | POA: Diagnosis not present

## 2021-01-25 ENCOUNTER — Other Ambulatory Visit: Payer: Self-pay

## 2021-01-25 ENCOUNTER — Ambulatory Visit (INDEPENDENT_AMBULATORY_CARE_PROVIDER_SITE_OTHER): Payer: Medicare Other | Admitting: Physician Assistant

## 2021-01-25 VITALS — BP 122/74 | HR 89 | Temp 98.6°F | Resp 18 | Ht 62.0 in | Wt 133.0 lb

## 2021-01-25 DIAGNOSIS — Z111 Encounter for screening for respiratory tuberculosis: Secondary | ICD-10-CM

## 2021-01-25 DIAGNOSIS — N390 Urinary tract infection, site not specified: Secondary | ICD-10-CM | POA: Diagnosis not present

## 2021-01-25 LAB — POCT URINALYSIS DIPSTICK
Bilirubin, UA: NEGATIVE
Glucose, UA: NEGATIVE
Ketones, UA: NEGATIVE
Nitrite, UA: POSITIVE
Protein, UA: POSITIVE — AB
Spec Grav, UA: 1.02 (ref 1.010–1.025)
Urobilinogen, UA: 0.2 E.U./dL
pH, UA: 6 (ref 5.0–8.0)

## 2021-01-25 MED ORDER — CEFDINIR 300 MG PO CAPS: 300.0000 mg | ORAL_CAPSULE | Freq: Two times a day (BID) | ORAL | 0 refills | Status: AC

## 2021-01-25 NOTE — Progress Notes (Signed)
Established patient visit   Patient: Monica Singh   DOB: October 27, 1928   87 y.o. Female  MRN: [id number] Visit Date: 01/25/2021  Today's healthcare provider: Dani Gobble Naethan Bracewell, [name]   Introduced myself to the patient as a Journalist, newspaper and provided education on APPs in clinical practice.   Subjective    HPI  Patient is here to get FL2 form filled out and get a TB blood draw.  Patient and her daughter express concern for a UTI - state she has these frequently  Reviewed FL2 form with patient and daughter -  Reviewed DNR paper work with patient and her daughter.    Patient and daughter deny need for assistance with feeding State patient is able to ambulate with assistance from walker/ rollator State patient is continent of bowel and bladder State patient would need assistance with bathing to reduce falls but is able to dress and feed herself She does not require O2 at this time She is currently undergoing treatment at [facility] for bulging disc and arthritis which will dictate needs for physical therapy.   Patient has DNR signed for facility use to be signed by facility admin and physician     Medications: Outpatient Medications Prior to Visit  Medication Sig   Cyanocobalamin (VITAMIN B-12 PO) Take by mouth.   folic acid (FOLVITE) 1 MG tablet Take 1 tablet (1 mg total) by mouth daily.   ibuprofen (ADVIL) 200 MG tablet Take 400 mg by mouth every 6 (six) hours as needed for moderate pain.   Ipratropium-Albuterol (COMBIVENT RESPIMAT) 20-100 MCG/ACT AERS respimat Inhale 1 puff into the lungs every 8 (eight) hours as needed for wheezing.   Probiotic Product (ALIGN PO) Take by mouth.   rosuvastatin (CRESTOR) 5 MG tablet Take 1 tablet (5 mg total) by mouth daily. PLEASE SCHEDULE OFFICE VISIT FOR FURTHER REFILLS. THANK YOU!   VITAMIN D PO Take by mouth.   celecoxib (CELEBREX) 100 MG capsule Take 1 capsule by mouth 2 (two) times daily. (Patient not taking: Reported on 01/25/2021)    No facility-administered medications prior to visit.    Review of Systems  Constitutional:  Negative for appetite change, chills, fatigue and fever.  HENT:  Negative for congestion, ear pain, sinus pressure, sinus pain, sore throat and trouble swallowing.   Respiratory:  Negative for chest tightness and shortness of breath.   Cardiovascular:  Negative for chest pain and palpitations.  Gastrointestinal:  Negative for abdominal pain, nausea and vomiting.  Genitourinary:  Negative for dysuria and hematuria.       States her urine is cloudy   Musculoskeletal:  Positive for arthralgias (left hip pain). Negative for myalgias, neck pain and neck stiffness.  Skin:  Negative for rash and wound.  Neurological:  Negative for dizziness and weakness.  Psychiatric/Behavioral:  Positive for confusion (acute, likely secondary to UTI).       Objective    BP 122/74 (BP Location: Right Arm, Patient Position: Sitting, Cuff Size: Normal)    Pulse 89    Temp 98.6 F (37 C) (Temporal)    Resp 18    Ht 5\' 2"  (1.575 m)    Wt 133 lb (60.3 kg)    SpO2 97%    BMI 24.33 kg/m  {Show previous vital signs (optional):23777}  Physical Exam Constitutional:      Appearance: Normal appearance. She is normal weight.  HENT:     Head: Normocephalic and atraumatic.     Right Ear: Tympanic  membrane, ear canal and external ear normal.     Left Ear: Tympanic membrane, ear canal and external ear normal.     Ears:     Comments: Hearing aides bilaterally     Mouth/Throat:     Lips: Pink.     Mouth: Mucous membranes are moist.     Dentition: Has dentures.     Pharynx: Oropharynx is clear. Uvula midline. No oropharyngeal exudate or posterior oropharyngeal erythema.     Tonsils: No tonsillar exudate or tonsillar abscesses.     Comments: Full Dentures on top  Eyes:     Extraocular Movements: Extraocular movements intact.     Conjunctiva/sclera: Conjunctivae normal.     Pupils: Pupils are equal, round, and reactive to  light.  Cardiovascular:     Rate and Rhythm: Normal rate and regular rhythm.     Pulses: Normal pulses.     Heart sounds: Normal heart sounds, S1 normal and S2 normal.  Pulmonary:     Effort: Pulmonary effort is normal.     Breath sounds: Normal breath sounds. No decreased breath sounds, wheezing, rhonchi or rales.  Abdominal:     General: Abdomen is flat. Bowel sounds are normal.     Palpations: Abdomen is soft.  Musculoskeletal:     Cervical back: Normal range of motion and neck supple.     Right lower leg: No edema.     Left lower leg: No edema.  Lymphadenopathy:     Head:     Right side of head: No submental or submandibular adenopathy.     Left side of head: No submental or submandibular adenopathy.     Upper Body:     Right upper body: No supraclavicular adenopathy.     Left upper body: No supraclavicular adenopathy.  Skin:    General: Skin is warm and dry.     Capillary Refill: Capillary refill takes less than 2 seconds.     Coloration: Skin is not jaundiced.     Findings: No bruising, erythema, lesion or rash.  Neurological:     General: No focal deficit present.     Mental Status: She is alert and oriented to person, place, and time.  Psychiatric:        Attention and Perception: Attention normal.        Mood and Affect: Mood and affect normal.        Speech: Speech normal.        Behavior: Behavior normal. Behavior is cooperative.        Thought Content: Thought content normal.        Judgment: Judgment normal.      No results found for any visits on 01/25/21.  Assessment & Plan     Patient presents for FL2 completion prior to admission to ALF  Comprehensive physical exam completed along with assessment of ADLs and overall assistance to be expected while in facility FL2 completed and signed for patient along with screenings for TB for admission.   Problem List Items Addressed This Visit       Genitourinary   Frequent UTI    Intermittent and  ongoing Urinalysis indicates current UTI today with urine culture results pending RX for Cefdinir 300mg  PO BID x 7days sent in  Previous urine culture from 11/16/2020 indicates E.coli with susceptibility to chosen abx but will review latest culture results to modify regimen as necessary       Relevant Medications   cefdinir (OMNICEF) 300 MG capsule  Other Relevant Orders   POCT Urinalysis Dipstick (Completed)   CULTURE, URINE COMPREHENSIVE   Other Visit Diagnoses     Screening for tuberculosis    -  Primary   Relevant Orders   QuantiFERON-TB Gold Plus        No follow-ups on file.   I, Allesha Aronoff E Marquest Gunkel, [name], have reviewed all documentation for this visit. The documentation on 01/25/21 for the exam, diagnosis, procedures, and orders are all accurate and complete.   Scarlett Portlock, [place]ie [place] MPH [facility]    No follow-ups on file.       Almon Register, [name]  [employer] [phone] (phone) [phone] (fax)  [place]

## 2021-01-25 NOTE — Patient Instructions (Signed)
Based on the urinalysis results it appears you have a UTI. I have sent in an antibiotic to treat this but if your urine culture shows that the infection is not susceptible to that antibiotic, we may have to change treatments Please stay well hydrated and use a probiotic or eat yogurts to help with stomach upset or diarrhea that could be caused by taking an antibiotic Call us if your symptoms worsen or do not improve after taking the antibiotic

## 2021-01-25 NOTE — Assessment & Plan Note (Signed)
Intermittent and ongoing Urinalysis indicates current UTI today with urine culture results pending RX for Cefdinir 300mg  PO BID x 7days sent in  Previous urine culture from 11/16/2020 indicates E.coli with susceptibility to chosen abx but will review latest culture results to modify regimen as necessary

## 2021-01-26 ENCOUNTER — Telehealth: Payer: Self-pay

## 2021-01-26 NOTE — Telephone Encounter (Signed)
Monica [facility] from [facility] is calling requesting FL 2 form to be completed. As no diagnosis were documented and needs exact dose of vitamin D3, vitamin B 12 and Al ing. She is also requesting an order to be faxed for cefdinir that was started yesterday so that they may administer medication. Fax number [phone]. She also needs clarification on celecoxib.

## 2021-01-28 LAB — QUANTIFERON-TB GOLD PLUS
QuantiFERON Mitogen Value: >10 IU/mL
QuantiFERON Nil Value: 0.02 IU/mL
QuantiFERON TB1 Ag Value: 0.02 IU/mL
QuantiFERON TB2 Ag Value: 0.02 IU/mL
QuantiFERON-TB Gold Plus: NEGATIVE

## 2021-01-30 NOTE — Telephone Encounter (Signed)
Form re-written and signed. Ready to fax.

## 2021-01-31 LAB — CULTURE, URINE COMPREHENSIVE

## 2021-01-31 LAB — SPECIMEN STATUS REPORT

## 2021-01-31 MED ORDER — NITROFURANTOIN MONOHYD MACRO 100 MG PO CAPS: 100.0000 mg | ORAL_CAPSULE | Freq: Two times a day (BID) | ORAL | 0 refills | Status: DC

## 2021-01-31 NOTE — Addendum Note (Signed)
Addended by: Talitha Givens on: 01/31/2021 04:32 PM   Modules accepted: Orders

## 2021-02-02 DIAGNOSIS — M48062 Spinal stenosis, lumbar region with neurogenic claudication: Secondary | ICD-10-CM | POA: Diagnosis not present

## 2021-02-02 DIAGNOSIS — M5416 Radiculopathy, lumbar region: Secondary | ICD-10-CM | POA: Diagnosis not present

## 2021-02-15 ENCOUNTER — Telehealth: Payer: Self-pay | Admitting: Family Medicine

## 2021-02-15 NOTE — Telephone Encounter (Signed)
Patient had mammogram in October and had some abnormalities that she was supposed to have additional imaging done, but it doesn't look like it was every done. Does she want to put in another order to get this scheduled?

## 2021-02-16 NOTE — Telephone Encounter (Signed)
Patient's daughter returned call:  Response to provider message: During that time patient had so many other issues going on that they decided to back burner that. At this time the patient has decided- due to her age- she does not want to pursue additional imaging. Advised will let provider know- patient and daughter very complimentary of care given in office.

## 2021-02-16 NOTE — Telephone Encounter (Signed)
Tried calling patient. Left message to call back. OK for pEC triage to speak with patient or her daughter [name].

## 2021-02-26 ENCOUNTER — Ambulatory Visit: Payer: Self-pay

## 2021-02-26 NOTE — Telephone Encounter (Signed)
° ° °  Chief Complaint: COVID 19 positive Symptoms: Fever, sore throat, headache Frequency: Sunday Pertinent Negatives: Patient denies cough Disposition: []ED /[]Urgent Care (no appt availability in office) / []Appointment(In office/virtual)/ [x] [facility]/ []Home Care/ []Refused Recommended Disposition /[][facility]/ [] Follow-up with PCP Additional Notes: Daughter will set up [place] virtual care  Reason for Disposition  [1] Fever > 101 F (38.3 C) AND [2] age > 60 years  Answer Assessment - Initial Assessment Questions 1. COVID-19 DIAGNOSIS: "Who made your COVID-19 diagnosis?" "Was it confirmed by a positive lab test or self-test?" If not diagnosed by a doctor (or NP/PA), ask "Are there lots of cases (community spread) where you live?" Note: See public health department website, if unsure.     Home 2. COVID-19 EXPOSURE: "Was there any known exposure to COVID before the symptoms began?" CDC Definition of close contact: within 6 feet (2 meters) for a total of 15 minutes or more over a 24-hour period.      No 3. ONSET: "When did the COVID-19 symptoms start?"      Sunday 4. WORST SYMPTOM: "What is your worst symptom?" (e.g., cough, fever, shortness of breath, muscle aches)     Fever, sore throat, headache 5. COUGH: "Do you have a cough?" If Yes, ask: "How bad is the cough?"       No 6. FEVER: "Do you have a fever?" If Yes, ask: "What is your temperature, how was it measured, and when did it start?"     10 2 7. RESPIRATORY STATUS: "Describe your breathing?" (e.g., shortness of breath, wheezing, unable to speak)      No 8. BETTER-SAME-WORSE: "Are you getting better, staying the same or getting worse compared to yesterday?"  If getting worse, ask, "In what way?"     Worse 9. HIGH RISK DISEASE: "Do you have any chronic medical problems?" (e.g., asthma, heart or lung disease, weak immune system, obesity, etc.)     COPD 10. VACCINE: "Have you had the COVID-19  vaccine?" If Yes, ask: "Which one, how many shots, when did you get it?"       Yes 11. BOOSTER: "Have you received your COVID-19 booster?" If Yes, ask: "Which one and when did you get it?"       Yes 12. PREGNANCY: "Is there any chance you are pregnant?" "When was your last menstrual period?"       No 13. OTHER SYMPTOMS: "Do you have any other symptoms?"  (e.g., chills, fatigue, headache, loss of smell or taste, muscle pain, sore throat)       Headache 14. O2 SATURATION MONITOR:  "Do you use an oxygen saturation monitor (pulse oximeter) at home?" If Yes, ask "What is your reading (oxygen level) today?" "What is your usual oxygen saturation reading?" (e.g., 95%)       No  Protocols used: Coronavirus (COVID-19) Diagnosed or Suspected-A-AH

## 2021-03-02 ENCOUNTER — Telehealth: Payer: Self-pay | Admitting: Family Medicine

## 2021-03-02 NOTE — Telephone Encounter (Signed)
[place] for u/a and culture.  Does she need treatment for Covid? How long has she had symptoms?

## 2021-03-02 NOTE — Telephone Encounter (Signed)
Tested positive for covid and has been in quarantine since but family is concern she may have a UTI and Anderson [facility] from [facility] is requesting orders to have urine tested asap/ please advise

## 2021-03-02 NOTE — Telephone Encounter (Signed)
Number listed below is a fax number. Order for UA, urine culture and sensitivity written and faxed to that number. I called [facility] and spoke with Miranda. She states that its been more than 5 days since she tested positive, but patient is doing exceptionally well. Jeannetta Nap says that her symptoms are very mild that you wouldn't be able to tell she had COVID.

## 2021-03-06 ENCOUNTER — Ambulatory Visit (INDEPENDENT_AMBULATORY_CARE_PROVIDER_SITE_OTHER): Payer: Medicare Other | Admitting: *Deleted

## 2021-03-06 DIAGNOSIS — N390 Urinary tract infection, site not specified: Secondary | ICD-10-CM | POA: Diagnosis not present

## 2021-03-06 NOTE — Telephone Encounter (Signed)
Summary: possible uti/wants to get today   Daughter [place] calling with concerns pt has a UTI.  Pt not acting herself.  She if saying ugly words, not getting dressed.  But the bad thing is she has covid.  It is day 9 and pt still testing positive.  Pt is at [facility]. They are not doing anything for her.  Pt has to stay in her room for 10 days and test negatiive.  Daughter would like to come and get specimen cup, and get urine. She will bring cup back to office.      Reason for Disposition  [1] Caller requesting NON-URGENT health information AND [2] PCP's office is the best resource  Answer Assessment - Initial Assessment Questions 1. REASON FOR CALL or QUESTION: "What is your reason for calling today?" or "How can I best help you?" or "What question do you have that I can help answer?"     Daughter is calling- she feels her mother has UTI- she is in isolation from [place] and she is having mood changes which is indication for her. Daughter request lab order for UTI- she would like to pick up cup- get specimen and bring it back to lab.  Protocols used: Information Only Call - No Triage-A-AH

## 2021-03-06 NOTE — Telephone Encounter (Addendum)
Orders for UA and urine culture was faxed over to [name] on 03/02/2021 (see phone message from 03/02/2021).  I called [facility] and spoke with [facility] requesting an update on test results. Monica Singh is going to look into finding the results and call our office back. I called patient's daughter Monica Singh and advised her that orders were sent to [facility] on 03/02/2021 and we are waiting on them to find the results for Dr. Caryn Section to review.

## 2021-03-07 ENCOUNTER — Other Ambulatory Visit: Payer: Self-pay | Admitting: Family Medicine

## 2021-03-07 DIAGNOSIS — N390 Urinary tract infection, site not specified: Secondary | ICD-10-CM | POA: Diagnosis not present

## 2021-03-07 LAB — POCT URINALYSIS DIPSTICK
Bilirubin, UA: NEGATIVE
Glucose, UA: NEGATIVE
Ketones, UA: NEGATIVE
Nitrite, UA: POSITIVE
Protein, UA: POSITIVE — AB
Spec Grav, UA: 1.01 (ref 1.010–1.025)
Urobilinogen, UA: 0.2 E.U./dL
pH, UA: 6.5 (ref 5.0–8.0)

## 2021-03-07 MED ORDER — NITROFURANTOIN MACROCRYSTAL 100 MG PO CAPS: 100.0000 mg | ORAL_CAPSULE | Freq: Two times a day (BID) | ORAL | 0 refills | Status: AC

## 2021-03-07 NOTE — Telephone Encounter (Signed)
Per chart patient has allergy to sulfa antibiotics: reaction -hives, itching. Please advise

## 2021-03-07 NOTE — Addendum Note (Signed)
Addended by: Randal Buba on: 03/07/2021 01:25 PM   Modules accepted: Orders

## 2021-03-07 NOTE — Telephone Encounter (Signed)
Patient's daughter returned call- please send Rx to [facility] Thank you again- so appreciate the office

## 2021-03-07 NOTE — Telephone Encounter (Signed)
Have sent prescription for macrobid for her to take

## 2021-03-07 NOTE — Telephone Encounter (Signed)
Patient's daughter [name] advised.

## 2021-03-07 NOTE — Telephone Encounter (Signed)
Birdie Sons, MD sent to [facility] Please send in prescription for Septra DS 1 tablet twice daily for 7 days to pharmacy of her choice.

## 2021-03-07 NOTE — Addendum Note (Signed)
Addended by: Randal Buba on: 03/07/2021 02:59 PM   Modules accepted: Orders

## 2021-03-07 NOTE — Telephone Encounter (Signed)
Patient's daughter came by the office to drop off a urine sample. She says that [facility] never collected a urine sample last week when we faxed over the orders. Patients daughter says that patient had a fever today. I performed a POC UA  and sent urine to lab for culture and sensitivity. Please review UA results and advise.

## 2021-03-07 NOTE — Addendum Note (Signed)
Addended by: Birdie Sons on: 03/07/2021 04:45 PM   Modules accepted: Orders

## 2021-03-12 DIAGNOSIS — M5136 Other intervertebral disc degeneration, lumbar region: Secondary | ICD-10-CM | POA: Diagnosis not present

## 2021-03-12 DIAGNOSIS — M48062 Spinal stenosis, lumbar region with neurogenic claudication: Secondary | ICD-10-CM | POA: Diagnosis not present

## 2021-03-12 DIAGNOSIS — M5416 Radiculopathy, lumbar region: Secondary | ICD-10-CM | POA: Diagnosis not present

## 2021-03-12 DIAGNOSIS — M62830 Muscle spasm of back: Secondary | ICD-10-CM | POA: Diagnosis not present

## 2021-03-13 DIAGNOSIS — Z96651 Presence of right artificial knee joint: Secondary | ICD-10-CM | POA: Diagnosis not present

## 2021-03-13 DIAGNOSIS — N390 Urinary tract infection, site not specified: Secondary | ICD-10-CM | POA: Diagnosis not present

## 2021-03-13 DIAGNOSIS — M48062 Spinal stenosis, lumbar region with neurogenic claudication: Secondary | ICD-10-CM | POA: Diagnosis not present

## 2021-03-13 DIAGNOSIS — Z85828 Personal history of other malignant neoplasm of skin: Secondary | ICD-10-CM | POA: Diagnosis not present

## 2021-03-13 DIAGNOSIS — J45909 Unspecified asthma, uncomplicated: Secondary | ICD-10-CM | POA: Diagnosis not present

## 2021-03-13 DIAGNOSIS — E039 Hypothyroidism, unspecified: Secondary | ICD-10-CM | POA: Diagnosis not present

## 2021-03-13 DIAGNOSIS — G8929 Other chronic pain: Secondary | ICD-10-CM | POA: Diagnosis not present

## 2021-03-13 DIAGNOSIS — M810 Age-related osteoporosis without current pathological fracture: Secondary | ICD-10-CM | POA: Diagnosis not present

## 2021-03-13 DIAGNOSIS — E559 Vitamin D deficiency, unspecified: Secondary | ICD-10-CM | POA: Diagnosis not present

## 2021-03-13 DIAGNOSIS — C8590 Non-Hodgkin lymphoma, unspecified, unspecified site: Secondary | ICD-10-CM | POA: Diagnosis not present

## 2021-03-13 DIAGNOSIS — G3184 Mild cognitive impairment, so stated: Secondary | ICD-10-CM | POA: Diagnosis not present

## 2021-03-13 DIAGNOSIS — Z87891 Personal history of nicotine dependence: Secondary | ICD-10-CM | POA: Diagnosis not present

## 2021-03-13 DIAGNOSIS — M5116 Intervertebral disc disorders with radiculopathy, lumbar region: Secondary | ICD-10-CM | POA: Diagnosis not present

## 2021-03-14 ENCOUNTER — Telehealth: Payer: Self-pay

## 2021-03-14 DIAGNOSIS — N390 Urinary tract infection, site not specified: Secondary | ICD-10-CM

## 2021-03-14 LAB — URINE CULTURE

## 2021-03-14 LAB — SPECIMEN STATUS REPORT

## 2021-03-14 NOTE — Telephone Encounter (Signed)
-----   Message from Birdie Sons, MD sent at 03/14/2021  2:39 PM EST ----- Urine culture show partial resistance to antibiotic that was prescribed. Recommend change to Septra DS one tablet BID for 7 days.

## 2021-03-14 NOTE — Telephone Encounter (Signed)
Patient has a sulfa allergy (Hives and itching). Please advise if ok to send in prescription.

## 2021-03-15 MED ORDER — CIPROFLOXACIN HCL 500 MG PO TABS: 500.0000 mg | ORAL_TABLET | Freq: Two times a day (BID) | ORAL | 0 refills | Status: DC

## 2021-03-15 NOTE — Telephone Encounter (Signed)
Patient's daughter Monica Singh advised.

## 2021-03-15 NOTE — Telephone Encounter (Signed)
Will have her start Cipro instead. Have sent prescription to her pharmacy

## 2021-03-16 ENCOUNTER — Ambulatory Visit: Payer: Self-pay | Admitting: *Deleted

## 2021-03-16 DIAGNOSIS — G8929 Other chronic pain: Secondary | ICD-10-CM | POA: Diagnosis not present

## 2021-03-16 DIAGNOSIS — J45909 Unspecified asthma, uncomplicated: Secondary | ICD-10-CM | POA: Diagnosis not present

## 2021-03-16 DIAGNOSIS — Z85828 Personal history of other malignant neoplasm of skin: Secondary | ICD-10-CM | POA: Diagnosis not present

## 2021-03-16 DIAGNOSIS — Z96651 Presence of right artificial knee joint: Secondary | ICD-10-CM | POA: Diagnosis not present

## 2021-03-16 DIAGNOSIS — N390 Urinary tract infection, site not specified: Secondary | ICD-10-CM | POA: Diagnosis not present

## 2021-03-16 DIAGNOSIS — G3184 Mild cognitive impairment, so stated: Secondary | ICD-10-CM | POA: Diagnosis not present

## 2021-03-16 DIAGNOSIS — Z87891 Personal history of nicotine dependence: Secondary | ICD-10-CM | POA: Diagnosis not present

## 2021-03-16 DIAGNOSIS — C8590 Non-Hodgkin lymphoma, unspecified, unspecified site: Secondary | ICD-10-CM | POA: Diagnosis not present

## 2021-03-16 DIAGNOSIS — M810 Age-related osteoporosis without current pathological fracture: Secondary | ICD-10-CM | POA: Diagnosis not present

## 2021-03-16 DIAGNOSIS — M48062 Spinal stenosis, lumbar region with neurogenic claudication: Secondary | ICD-10-CM | POA: Diagnosis not present

## 2021-03-16 DIAGNOSIS — M5116 Intervertebral disc disorders with radiculopathy, lumbar region: Secondary | ICD-10-CM | POA: Diagnosis not present

## 2021-03-16 DIAGNOSIS — E559 Vitamin D deficiency, unspecified: Secondary | ICD-10-CM | POA: Diagnosis not present

## 2021-03-16 DIAGNOSIS — E039 Hypothyroidism, unspecified: Secondary | ICD-10-CM | POA: Diagnosis not present

## 2021-03-16 MED ORDER — DOXYCYCLINE HYCLATE 100 MG PO TABS: 100.0000 mg | ORAL_TABLET | Freq: Two times a day (BID) | ORAL | 0 refills | Status: AC

## 2021-03-16 NOTE — Telephone Encounter (Signed)
Change to doxycycline. Prescription has been sent to pharmacy.

## 2021-03-16 NOTE — Telephone Encounter (Signed)
See previous encounter from NT today regarding medication reaction from cipro.    Chief Complaint: patient's daughter reports  patient called this am from assisted living facility and reports rash to face after starting cipro  Symptoms: whole face with rash and severe itching Frequency: c/o today started medication yesterday  Pertinent Negatives: Patient denies difficulty breathing or swallowing  Disposition: [] ED /[x] Urgent Care (no appt availability in office) / [] Appointment(In office/virtual)/ []  [facility]/ [] Home Care/ [] Refused Recommended Disposition /[] [facility]/ [x]  Follow-up with PCP Additional Notes:    Please advise if another medication can be prescribed rather than cipro.recommended to give antihistamine. Recommended taking patient to UC or ED if symptoms worsen. Daughter requesting a call back . Patient lives in assisted living and daughter will need to go get patient .

## 2021-03-16 NOTE — Telephone Encounter (Signed)
°  Chief Complaint: patient's daughter reports  patient called this am from assisted living facility and reports rash to face after starting cipro  Symptoms: whole face with rash and severe itching Frequency: c/o today started medication yesterday  Pertinent Negatives: Patient denies difficulty breathing or swallowing  Disposition: [] ED /[x] Urgent Care (no appt availability in office) / [] Appointment(In office/virtual)/ []  [facility]/ [] Home Care/ [] Refused Recommended Disposition /[] [facility]/ [x]  Follow-up with PCP Additional Notes:   Please advise if another medication can be prescribed rather than cipro.recommended to give antihistamine. Recommended taking patient to UC or ED if symptoms worsen. Daughter requesting a call back . Patient lives in assisted living and daughter will need to go get patient .  Reason for Disposition  [1] Caller has URGENT medicine question about med that PCP or specialist prescribed AND [2] triager unable to answer question  Answer Assessment - Initial Assessment Questions 1. NAME of MEDICATION: "What medicine are you calling about?"     Cipro  2. QUESTION: "What is your question?" (e.g., double dose of medicine, side effect)    Patient may have allergic reaction to medication due to rash on face , severe itching. 3. PRESCRIBING HCP: "Who prescribed it?" Reason: if prescribed by specialist, call should be referred to that group.     PCP 4. SYMPTOMS: "Do you have any symptoms?"     Rash, itching  5. SEVERITY: If symptoms are present, ask "Are they mild, moderate or severe?"     severe 6. PREGNANCY:  "Is there any chance that you are pregnant?" "When was your last menstrual period?"     na  Protocols used: Medication Question Call-A-AH

## 2021-03-16 NOTE — Addendum Note (Signed)
Addended by: Birdie Sons on: 03/16/2021 12:11 PM   Modules accepted: Orders

## 2021-03-16 NOTE — Telephone Encounter (Signed)
Patient's daughter [name] advised.

## 2021-03-16 NOTE — Telephone Encounter (Signed)
I called and spoke with patient's daughter [name]. She reports that patient started taking the new antibiotic Cipro yesterday morning. As of right now she has taken 3 doses. Patient woke up this morning with facial itchiness and whelps around her ears. Per [name], patient denies any shortness of breath, dyspnea, trouble swallowing or vision changes. [name] believes she is having an allergic reaction to the Cipro and would like advice on whether she needs to be changed to something else. Please advise.

## 2021-03-21 DIAGNOSIS — E039 Hypothyroidism, unspecified: Secondary | ICD-10-CM | POA: Diagnosis not present

## 2021-03-21 DIAGNOSIS — Z96651 Presence of right artificial knee joint: Secondary | ICD-10-CM | POA: Diagnosis not present

## 2021-03-21 DIAGNOSIS — Z87891 Personal history of nicotine dependence: Secondary | ICD-10-CM | POA: Diagnosis not present

## 2021-03-21 DIAGNOSIS — M810 Age-related osteoporosis without current pathological fracture: Secondary | ICD-10-CM | POA: Diagnosis not present

## 2021-03-21 DIAGNOSIS — Z85828 Personal history of other malignant neoplasm of skin: Secondary | ICD-10-CM | POA: Diagnosis not present

## 2021-03-21 DIAGNOSIS — N390 Urinary tract infection, site not specified: Secondary | ICD-10-CM | POA: Diagnosis not present

## 2021-03-21 DIAGNOSIS — J45909 Unspecified asthma, uncomplicated: Secondary | ICD-10-CM | POA: Diagnosis not present

## 2021-03-21 DIAGNOSIS — E559 Vitamin D deficiency, unspecified: Secondary | ICD-10-CM | POA: Diagnosis not present

## 2021-03-21 DIAGNOSIS — C8590 Non-Hodgkin lymphoma, unspecified, unspecified site: Secondary | ICD-10-CM | POA: Diagnosis not present

## 2021-03-21 DIAGNOSIS — G8929 Other chronic pain: Secondary | ICD-10-CM | POA: Diagnosis not present

## 2021-03-21 DIAGNOSIS — G3184 Mild cognitive impairment, so stated: Secondary | ICD-10-CM | POA: Diagnosis not present

## 2021-03-21 DIAGNOSIS — M48062 Spinal stenosis, lumbar region with neurogenic claudication: Secondary | ICD-10-CM | POA: Diagnosis not present

## 2021-03-21 DIAGNOSIS — M5116 Intervertebral disc disorders with radiculopathy, lumbar region: Secondary | ICD-10-CM | POA: Diagnosis not present

## 2021-03-23 DIAGNOSIS — Z85828 Personal history of other malignant neoplasm of skin: Secondary | ICD-10-CM | POA: Diagnosis not present

## 2021-03-23 DIAGNOSIS — J45909 Unspecified asthma, uncomplicated: Secondary | ICD-10-CM | POA: Diagnosis not present

## 2021-03-23 DIAGNOSIS — G3184 Mild cognitive impairment, so stated: Secondary | ICD-10-CM | POA: Diagnosis not present

## 2021-03-23 DIAGNOSIS — N390 Urinary tract infection, site not specified: Secondary | ICD-10-CM | POA: Diagnosis not present

## 2021-03-23 DIAGNOSIS — Z96651 Presence of right artificial knee joint: Secondary | ICD-10-CM | POA: Diagnosis not present

## 2021-03-23 DIAGNOSIS — M48062 Spinal stenosis, lumbar region with neurogenic claudication: Secondary | ICD-10-CM | POA: Diagnosis not present

## 2021-03-23 DIAGNOSIS — C8590 Non-Hodgkin lymphoma, unspecified, unspecified site: Secondary | ICD-10-CM | POA: Diagnosis not present

## 2021-03-23 DIAGNOSIS — E559 Vitamin D deficiency, unspecified: Secondary | ICD-10-CM | POA: Diagnosis not present

## 2021-03-23 DIAGNOSIS — G8929 Other chronic pain: Secondary | ICD-10-CM | POA: Diagnosis not present

## 2021-03-23 DIAGNOSIS — M810 Age-related osteoporosis without current pathological fracture: Secondary | ICD-10-CM | POA: Diagnosis not present

## 2021-03-23 DIAGNOSIS — M5116 Intervertebral disc disorders with radiculopathy, lumbar region: Secondary | ICD-10-CM | POA: Diagnosis not present

## 2021-03-23 DIAGNOSIS — Z87891 Personal history of nicotine dependence: Secondary | ICD-10-CM | POA: Diagnosis not present

## 2021-03-23 DIAGNOSIS — E039 Hypothyroidism, unspecified: Secondary | ICD-10-CM | POA: Diagnosis not present

## 2021-03-29 DIAGNOSIS — Z96651 Presence of right artificial knee joint: Secondary | ICD-10-CM | POA: Diagnosis not present

## 2021-03-29 DIAGNOSIS — E559 Vitamin D deficiency, unspecified: Secondary | ICD-10-CM | POA: Diagnosis not present

## 2021-03-29 DIAGNOSIS — N390 Urinary tract infection, site not specified: Secondary | ICD-10-CM | POA: Diagnosis not present

## 2021-03-29 DIAGNOSIS — C8590 Non-Hodgkin lymphoma, unspecified, unspecified site: Secondary | ICD-10-CM | POA: Diagnosis not present

## 2021-03-29 DIAGNOSIS — M810 Age-related osteoporosis without current pathological fracture: Secondary | ICD-10-CM | POA: Diagnosis not present

## 2021-03-29 DIAGNOSIS — Z85828 Personal history of other malignant neoplasm of skin: Secondary | ICD-10-CM | POA: Diagnosis not present

## 2021-03-29 DIAGNOSIS — G8929 Other chronic pain: Secondary | ICD-10-CM | POA: Diagnosis not present

## 2021-03-29 DIAGNOSIS — E039 Hypothyroidism, unspecified: Secondary | ICD-10-CM | POA: Diagnosis not present

## 2021-03-29 DIAGNOSIS — G3184 Mild cognitive impairment, so stated: Secondary | ICD-10-CM | POA: Diagnosis not present

## 2021-03-29 DIAGNOSIS — M5116 Intervertebral disc disorders with radiculopathy, lumbar region: Secondary | ICD-10-CM | POA: Diagnosis not present

## 2021-03-29 DIAGNOSIS — Z87891 Personal history of nicotine dependence: Secondary | ICD-10-CM | POA: Diagnosis not present

## 2021-03-29 DIAGNOSIS — J45909 Unspecified asthma, uncomplicated: Secondary | ICD-10-CM | POA: Diagnosis not present

## 2021-03-29 DIAGNOSIS — M48062 Spinal stenosis, lumbar region with neurogenic claudication: Secondary | ICD-10-CM | POA: Diagnosis not present

## 2021-03-30 DIAGNOSIS — Z96651 Presence of right artificial knee joint: Secondary | ICD-10-CM | POA: Diagnosis not present

## 2021-03-30 DIAGNOSIS — E039 Hypothyroidism, unspecified: Secondary | ICD-10-CM | POA: Diagnosis not present

## 2021-03-30 DIAGNOSIS — N390 Urinary tract infection, site not specified: Secondary | ICD-10-CM | POA: Diagnosis not present

## 2021-03-30 DIAGNOSIS — C8590 Non-Hodgkin lymphoma, unspecified, unspecified site: Secondary | ICD-10-CM | POA: Diagnosis not present

## 2021-03-30 DIAGNOSIS — M5116 Intervertebral disc disorders with radiculopathy, lumbar region: Secondary | ICD-10-CM | POA: Diagnosis not present

## 2021-03-30 DIAGNOSIS — G3184 Mild cognitive impairment, so stated: Secondary | ICD-10-CM | POA: Diagnosis not present

## 2021-03-30 DIAGNOSIS — E559 Vitamin D deficiency, unspecified: Secondary | ICD-10-CM | POA: Diagnosis not present

## 2021-03-30 DIAGNOSIS — Z87891 Personal history of nicotine dependence: Secondary | ICD-10-CM | POA: Diagnosis not present

## 2021-03-30 DIAGNOSIS — M810 Age-related osteoporosis without current pathological fracture: Secondary | ICD-10-CM | POA: Diagnosis not present

## 2021-03-30 DIAGNOSIS — Z85828 Personal history of other malignant neoplasm of skin: Secondary | ICD-10-CM | POA: Diagnosis not present

## 2021-03-30 DIAGNOSIS — J45909 Unspecified asthma, uncomplicated: Secondary | ICD-10-CM | POA: Diagnosis not present

## 2021-03-30 DIAGNOSIS — M48062 Spinal stenosis, lumbar region with neurogenic claudication: Secondary | ICD-10-CM | POA: Diagnosis not present

## 2021-03-30 DIAGNOSIS — G8929 Other chronic pain: Secondary | ICD-10-CM | POA: Diagnosis not present

## 2021-04-03 DIAGNOSIS — M5136 Other intervertebral disc degeneration, lumbar region: Secondary | ICD-10-CM | POA: Diagnosis not present

## 2021-04-03 DIAGNOSIS — M48062 Spinal stenosis, lumbar region with neurogenic claudication: Secondary | ICD-10-CM | POA: Diagnosis not present

## 2021-04-03 DIAGNOSIS — M62830 Muscle spasm of back: Secondary | ICD-10-CM | POA: Diagnosis not present

## 2021-04-03 DIAGNOSIS — M5416 Radiculopathy, lumbar region: Secondary | ICD-10-CM | POA: Diagnosis not present

## 2021-04-04 DIAGNOSIS — E039 Hypothyroidism, unspecified: Secondary | ICD-10-CM | POA: Diagnosis not present

## 2021-04-04 DIAGNOSIS — M810 Age-related osteoporosis without current pathological fracture: Secondary | ICD-10-CM | POA: Diagnosis not present

## 2021-04-04 DIAGNOSIS — J45909 Unspecified asthma, uncomplicated: Secondary | ICD-10-CM | POA: Diagnosis not present

## 2021-04-04 DIAGNOSIS — M5116 Intervertebral disc disorders with radiculopathy, lumbar region: Secondary | ICD-10-CM | POA: Diagnosis not present

## 2021-04-04 DIAGNOSIS — N390 Urinary tract infection, site not specified: Secondary | ICD-10-CM | POA: Diagnosis not present

## 2021-04-04 DIAGNOSIS — Z96651 Presence of right artificial knee joint: Secondary | ICD-10-CM | POA: Diagnosis not present

## 2021-04-04 DIAGNOSIS — E559 Vitamin D deficiency, unspecified: Secondary | ICD-10-CM | POA: Diagnosis not present

## 2021-04-04 DIAGNOSIS — C8590 Non-Hodgkin lymphoma, unspecified, unspecified site: Secondary | ICD-10-CM | POA: Diagnosis not present

## 2021-04-04 DIAGNOSIS — Z87891 Personal history of nicotine dependence: Secondary | ICD-10-CM | POA: Diagnosis not present

## 2021-04-04 DIAGNOSIS — G8929 Other chronic pain: Secondary | ICD-10-CM | POA: Diagnosis not present

## 2021-04-04 DIAGNOSIS — G3184 Mild cognitive impairment, so stated: Secondary | ICD-10-CM | POA: Diagnosis not present

## 2021-04-04 DIAGNOSIS — Z85828 Personal history of other malignant neoplasm of skin: Secondary | ICD-10-CM | POA: Diagnosis not present

## 2021-04-04 DIAGNOSIS — M48062 Spinal stenosis, lumbar region with neurogenic claudication: Secondary | ICD-10-CM | POA: Diagnosis not present

## 2021-04-05 DIAGNOSIS — M48062 Spinal stenosis, lumbar region with neurogenic claudication: Secondary | ICD-10-CM | POA: Diagnosis not present

## 2021-04-05 DIAGNOSIS — M810 Age-related osteoporosis without current pathological fracture: Secondary | ICD-10-CM | POA: Diagnosis not present

## 2021-04-05 DIAGNOSIS — G8929 Other chronic pain: Secondary | ICD-10-CM | POA: Diagnosis not present

## 2021-04-05 DIAGNOSIS — E039 Hypothyroidism, unspecified: Secondary | ICD-10-CM | POA: Diagnosis not present

## 2021-04-05 DIAGNOSIS — G3184 Mild cognitive impairment, so stated: Secondary | ICD-10-CM | POA: Diagnosis not present

## 2021-04-05 DIAGNOSIS — Z96651 Presence of right artificial knee joint: Secondary | ICD-10-CM | POA: Diagnosis not present

## 2021-04-05 DIAGNOSIS — Z87891 Personal history of nicotine dependence: Secondary | ICD-10-CM | POA: Diagnosis not present

## 2021-04-05 DIAGNOSIS — E559 Vitamin D deficiency, unspecified: Secondary | ICD-10-CM | POA: Diagnosis not present

## 2021-04-05 DIAGNOSIS — N390 Urinary tract infection, site not specified: Secondary | ICD-10-CM | POA: Diagnosis not present

## 2021-04-05 DIAGNOSIS — M5116 Intervertebral disc disorders with radiculopathy, lumbar region: Secondary | ICD-10-CM | POA: Diagnosis not present

## 2021-04-05 DIAGNOSIS — J45909 Unspecified asthma, uncomplicated: Secondary | ICD-10-CM | POA: Diagnosis not present

## 2021-04-05 DIAGNOSIS — C8590 Non-Hodgkin lymphoma, unspecified, unspecified site: Secondary | ICD-10-CM | POA: Diagnosis not present

## 2021-04-05 DIAGNOSIS — Z85828 Personal history of other malignant neoplasm of skin: Secondary | ICD-10-CM | POA: Diagnosis not present

## 2021-04-12 DIAGNOSIS — C8590 Non-Hodgkin lymphoma, unspecified, unspecified site: Secondary | ICD-10-CM | POA: Diagnosis not present

## 2021-04-12 DIAGNOSIS — G3184 Mild cognitive impairment, so stated: Secondary | ICD-10-CM | POA: Diagnosis not present

## 2021-04-12 DIAGNOSIS — Z85828 Personal history of other malignant neoplasm of skin: Secondary | ICD-10-CM | POA: Diagnosis not present

## 2021-04-12 DIAGNOSIS — J45909 Unspecified asthma, uncomplicated: Secondary | ICD-10-CM | POA: Diagnosis not present

## 2021-04-12 DIAGNOSIS — E559 Vitamin D deficiency, unspecified: Secondary | ICD-10-CM | POA: Diagnosis not present

## 2021-04-12 DIAGNOSIS — Z87891 Personal history of nicotine dependence: Secondary | ICD-10-CM | POA: Diagnosis not present

## 2021-04-12 DIAGNOSIS — M48062 Spinal stenosis, lumbar region with neurogenic claudication: Secondary | ICD-10-CM | POA: Diagnosis not present

## 2021-04-12 DIAGNOSIS — G8929 Other chronic pain: Secondary | ICD-10-CM | POA: Diagnosis not present

## 2021-04-12 DIAGNOSIS — M810 Age-related osteoporosis without current pathological fracture: Secondary | ICD-10-CM | POA: Diagnosis not present

## 2021-04-12 DIAGNOSIS — E039 Hypothyroidism, unspecified: Secondary | ICD-10-CM | POA: Diagnosis not present

## 2021-04-12 DIAGNOSIS — Z96651 Presence of right artificial knee joint: Secondary | ICD-10-CM | POA: Diagnosis not present

## 2021-04-12 DIAGNOSIS — N390 Urinary tract infection, site not specified: Secondary | ICD-10-CM | POA: Diagnosis not present

## 2021-04-12 DIAGNOSIS — M5116 Intervertebral disc disorders with radiculopathy, lumbar region: Secondary | ICD-10-CM | POA: Diagnosis not present

## 2021-04-16 ENCOUNTER — Telehealth: Payer: Self-pay | Admitting: Urology

## 2021-04-16 NOTE — Telephone Encounter (Signed)
Called pt's daughter, appt scheduled.  ?

## 2021-04-16 NOTE — Telephone Encounter (Signed)
Mrs. [place] will be due for a stent exchange in April, so she needs to have an appointment with Dr. Diamantina Providence in the next two weeks to get set up for the exchange.  ?

## 2021-04-18 DIAGNOSIS — M5116 Intervertebral disc disorders with radiculopathy, lumbar region: Secondary | ICD-10-CM | POA: Diagnosis not present

## 2021-04-18 DIAGNOSIS — M810 Age-related osteoporosis without current pathological fracture: Secondary | ICD-10-CM | POA: Diagnosis not present

## 2021-04-18 DIAGNOSIS — J45909 Unspecified asthma, uncomplicated: Secondary | ICD-10-CM | POA: Diagnosis not present

## 2021-04-18 DIAGNOSIS — G8929 Other chronic pain: Secondary | ICD-10-CM | POA: Diagnosis not present

## 2021-04-18 DIAGNOSIS — Z87891 Personal history of nicotine dependence: Secondary | ICD-10-CM | POA: Diagnosis not present

## 2021-04-18 DIAGNOSIS — G3184 Mild cognitive impairment, so stated: Secondary | ICD-10-CM | POA: Diagnosis not present

## 2021-04-18 DIAGNOSIS — M48062 Spinal stenosis, lumbar region with neurogenic claudication: Secondary | ICD-10-CM | POA: Diagnosis not present

## 2021-04-18 DIAGNOSIS — N390 Urinary tract infection, site not specified: Secondary | ICD-10-CM | POA: Diagnosis not present

## 2021-04-18 DIAGNOSIS — Z85828 Personal history of other malignant neoplasm of skin: Secondary | ICD-10-CM | POA: Diagnosis not present

## 2021-04-18 DIAGNOSIS — C8590 Non-Hodgkin lymphoma, unspecified, unspecified site: Secondary | ICD-10-CM | POA: Diagnosis not present

## 2021-04-18 DIAGNOSIS — E559 Vitamin D deficiency, unspecified: Secondary | ICD-10-CM | POA: Diagnosis not present

## 2021-04-18 DIAGNOSIS — Z96651 Presence of right artificial knee joint: Secondary | ICD-10-CM | POA: Diagnosis not present

## 2021-04-18 DIAGNOSIS — E039 Hypothyroidism, unspecified: Secondary | ICD-10-CM | POA: Diagnosis not present

## 2021-04-23 ENCOUNTER — Other Ambulatory Visit: Payer: Self-pay | Admitting: Urology

## 2021-04-23 ENCOUNTER — Encounter: Payer: Self-pay | Admitting: Urology

## 2021-04-23 ENCOUNTER — Ambulatory Visit: Payer: Medicare Other | Admitting: Urology

## 2021-04-23 ENCOUNTER — Other Ambulatory Visit: Payer: Self-pay

## 2021-04-23 VITALS — BP 149/72 | HR 83 | Ht 62.0 in | Wt 139.7 lb

## 2021-04-23 DIAGNOSIS — N390 Urinary tract infection, site not specified: Secondary | ICD-10-CM

## 2021-04-23 DIAGNOSIS — N135 Crossing vessel and stricture of ureter without hydronephrosis: Secondary | ICD-10-CM

## 2021-04-23 MED ORDER — ESTRADIOL 0.1 MG/GM VA CREA: TOPICAL_CREAM | VAGINAL | 12 refills | Status: DC

## 2021-04-23 MED ORDER — DOXYCYCLINE HYCLATE 100 MG PO CAPS: 100.0000 mg | ORAL_CAPSULE | Freq: Two times a day (BID) | ORAL | 0 refills | Status: DC

## 2021-04-23 NOTE — Progress Notes (Signed)
? ?  04/23/2021 ?4:01 PM  ? ?[place] ?1928/05/10 ?[id number] ? ?Reason for visit: Follow up left UPJ obstruction, recurrent UTIs ? 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  Diagnostic ureteroscopy in January 2020 did not show any concerning lesions and cytology was negative.  She has been undergoing yearly stent changes since that time with resolution of her left-sided flank pain.  She does continue to get UTIs with the stent in place, though less frequently than prior to having the stent in.  UTI symptoms are typically forgetfulness/confusion and cloudy urine, and resolved with antibiotics.  Her stent was last changed on 05/05/2020.   ? ?I personally viewed and interpreted the renal ultrasound from 11/2020 that shows no hydronephrosis and appropriate positioning of stent ? ?Regarding her recurrent UTIs, I think it is reasonable at this point to try some other strategies including topical estrogen cream as well as cranberry tablet prophylaxis.  Risks and benefits discussed.  She has had some multidrug-resistant infections previously.  She has an upcoming trip to the beach for a few weeks, and was wondering if we can send in a antibiotic in case she has significant confusion attributed to a UTI, and I think this is reasonable.  A prescription for doxycycline was sent in on an as-needed basis for the upcoming few weeks when she is out of town. ? ?We will schedule cystoscopy and left ureteral stent change in the next few months ?Trial of topical estrogen cream and cranberry tablet prophylaxis for UTI prevention ? ? ?Billey Co, MD ? ?[facility] ?[address], [address] ?[place], [place] [address] ?[phone] ? ? ?

## 2021-04-23 NOTE — Progress Notes (Signed)
Surgical Physician Order Form [facility] ? ?* Scheduling expectation : Within the next 2 months ? ?*Length of Case: 20 minutes ? ?*Clearance needed: no ? ?*Anticoagulation Instructions: May continue all anticoagulants ? ?*Aspirin Instructions: Ok to continue all ? ?*Post-op visit Date/Instructions:   TBD ? ?*Diagnosis: Left UPJ obstruction ? ?*Procedure: left  Cysto w/stent exchange (87564) ? ? ?Additional orders: N/A ? ?-Admit type: OUTpatient ? ?-Anesthesia: MAC ? ?-VTE Prophylaxis Standing Order SCD?s    ?   ?Other:  ? ?-Standing Lab Orders Per Anesthesia   ? ?Lab other: UA&Urine Culture ? ?-Standing Test orders EKG/Chest x-ray per Anesthesia      ? ?Test other:  ? ?- Medications:  Ancef 1gm IV ? ?-Other orders:  N/A ? ? ? ?  ? ?

## 2021-04-25 DIAGNOSIS — Z87891 Personal history of nicotine dependence: Secondary | ICD-10-CM | POA: Diagnosis not present

## 2021-04-25 DIAGNOSIS — Z96651 Presence of right artificial knee joint: Secondary | ICD-10-CM | POA: Diagnosis not present

## 2021-04-25 DIAGNOSIS — J45909 Unspecified asthma, uncomplicated: Secondary | ICD-10-CM | POA: Diagnosis not present

## 2021-04-25 DIAGNOSIS — M5116 Intervertebral disc disorders with radiculopathy, lumbar region: Secondary | ICD-10-CM | POA: Diagnosis not present

## 2021-04-25 DIAGNOSIS — M810 Age-related osteoporosis without current pathological fracture: Secondary | ICD-10-CM | POA: Diagnosis not present

## 2021-04-25 DIAGNOSIS — E039 Hypothyroidism, unspecified: Secondary | ICD-10-CM | POA: Diagnosis not present

## 2021-04-25 DIAGNOSIS — E559 Vitamin D deficiency, unspecified: Secondary | ICD-10-CM | POA: Diagnosis not present

## 2021-04-25 DIAGNOSIS — Z85828 Personal history of other malignant neoplasm of skin: Secondary | ICD-10-CM | POA: Diagnosis not present

## 2021-04-25 DIAGNOSIS — G3184 Mild cognitive impairment, so stated: Secondary | ICD-10-CM | POA: Diagnosis not present

## 2021-04-25 DIAGNOSIS — N390 Urinary tract infection, site not specified: Secondary | ICD-10-CM | POA: Diagnosis not present

## 2021-04-25 DIAGNOSIS — C8590 Non-Hodgkin lymphoma, unspecified, unspecified site: Secondary | ICD-10-CM | POA: Diagnosis not present

## 2021-04-25 DIAGNOSIS — G8929 Other chronic pain: Secondary | ICD-10-CM | POA: Diagnosis not present

## 2021-04-25 DIAGNOSIS — M48062 Spinal stenosis, lumbar region with neurogenic claudication: Secondary | ICD-10-CM | POA: Diagnosis not present

## 2021-04-30 ENCOUNTER — Encounter: Payer: Medicare Other | Admitting: Dermatology

## 2021-04-30 ENCOUNTER — Telehealth: Payer: Self-pay

## 2021-04-30 NOTE — Progress Notes (Signed)
[facility] Posting Form  ? ?Surgery Date/Time: Date: 06/01/2021 ? ?Surgeon: Dr. Nickolas Madrid, MD ? ?Surgery Location: Day Surgery ? ?Inpt ( No  )   Outpt (Yes)   Obs ( No  )  ? ?Diagnosis: Left Ureteropelvic Junction Obstruction N13.5 ? ?-CPT: [id number]  ? ?Surgery: Left Cystoscopy with Stent Exchange ? ?Stop Anticoagulations: No and may continue ASA ? ?Cardiac/Medical/Pulmonary Clearance needed: no ? ?*Orders entered into EPIC  Date: 04/30/21  ? ?*Case booked in [place]  Date: 04/26/2021 ? ?*Notified pt of Surgery: Date: 04/26/2021 ? ?PRE-OP UA & CX: yes, will obtain on 05/15/2021 ? ?*Placed into Prior Authorization Work Fabio Bering Date: 04/30/21 ? ? ?Assistant/laser/rep:No ? ? ? ? ? ? ? ? ? ? ? ? ? ? ? ?

## 2021-04-30 NOTE — Telephone Encounter (Signed)
I spoke with Mrs. Shipmans daughter [name]. We have discussed possible surgery dates and Friday May 5th, 2023 was agreed upon by all parties. Patient given information about surgery date, what to expect pre-operatively and post operatively.  ?We discussed that a Pre-Admission Testing office will be calling to set up the pre-op visit that will take place prior to surgery, and that these appointments are typically done over the phone with a Pre-Admissions RN.  ? ?Informed patient that our office will communicate any additional care to be provided after surgery. Patients questions or concerns were discussed during our call. Advised to call our office should there be any additional information, questions or concerns that arise. Patient verbalized understanding.  ? ? ? ? ? ? ? ? ? ? ? ? ? ?  ?

## 2021-05-10 ENCOUNTER — Other Ambulatory Visit: Payer: Self-pay | Admitting: Family Medicine

## 2021-05-14 DIAGNOSIS — M5416 Radiculopathy, lumbar region: Secondary | ICD-10-CM | POA: Diagnosis not present

## 2021-05-14 DIAGNOSIS — M48062 Spinal stenosis, lumbar region with neurogenic claudication: Secondary | ICD-10-CM | POA: Diagnosis not present

## 2021-05-15 ENCOUNTER — Other Ambulatory Visit: Payer: Medicare Other

## 2021-05-15 DIAGNOSIS — N135 Crossing vessel and stricture of ureter without hydronephrosis: Secondary | ICD-10-CM

## 2021-05-15 LAB — URINALYSIS, COMPLETE
Bilirubin, UA: NEGATIVE
Glucose, UA: NEGATIVE
Nitrite, UA: POSITIVE — AB
Specific Gravity, UA: 1.02 (ref 1.005–1.030)
Urobilinogen, Ur: 0.2 mg/dL (ref 0.2–1.0)
pH, UA: 6 (ref 5.0–7.5)

## 2021-05-15 LAB — MICROSCOPIC EXAMINATION: WBC, UA: >30 /hpf — AB (ref 0–5)

## 2021-05-19 LAB — CULTURE, URINE COMPREHENSIVE

## 2021-05-21 ENCOUNTER — Telehealth: Payer: Self-pay

## 2021-05-21 DIAGNOSIS — N390 Urinary tract infection, site not specified: Secondary | ICD-10-CM

## 2021-05-21 MED ORDER — AMOXICILLIN-POT CLAVULANATE 875-125 MG PO TABS: 1.0000 | ORAL_TABLET | Freq: Two times a day (BID) | ORAL | 0 refills | Status: AC

## 2021-05-21 NOTE — Telephone Encounter (Signed)
Called pt's daughter informed her of the information below. RX  sent.  ?

## 2021-05-21 NOTE — Telephone Encounter (Signed)
-----   Message from Billey Co, MD sent at 05/21/2021  1:35 PM EDT ----- ?Lets do Augmentin 875-125 BID x 5 days to sterilize urine prior to stent change. She should start this medication 5 days prior to surgery ? ?Nickolas Madrid, MD ?05/21/2021 ? ? ?

## 2021-05-23 ENCOUNTER — Other Ambulatory Visit
Admission: RE | Admit: 2021-05-23 | Discharge: 2021-05-23 | Disposition: A | Discharge status: Home / Self Care | Payer: Medicare Other | Source: Ambulatory Visit | Attending: Urology | Admitting: Urology

## 2021-05-23 VITALS — Ht 64.0 in | Wt 138.0 lb

## 2021-05-23 DIAGNOSIS — J449 Chronic obstructive pulmonary disease, unspecified: Secondary | ICD-10-CM

## 2021-05-23 DIAGNOSIS — Z01812 Encounter for preprocedural laboratory examination: Secondary | ICD-10-CM

## 2021-05-23 DIAGNOSIS — I6523 Occlusion and stenosis of bilateral carotid arteries: Secondary | ICD-10-CM

## 2021-05-23 HISTORY — DX: Nonrheumatic mitral (valve) insufficiency: I34.0

## 2021-05-23 HISTORY — DX: Syncope and collapse: R55

## 2021-05-23 NOTE — Patient Instructions (Addendum)
?Your procedure is scheduled on: Friday Jun 01, 2021 ?Report to Day Surgery inside [facility] 2nd floor, stop by admissions desk before getting on elevator.  ?To find out your arrival time please call [phone] between 1PM - 3PM on Thursday May 31, 2021. ? ?Remember: Instructions that are not followed completely may result in serious medical risk,  ?up to and including death, or upon the discretion of your surgeon and anesthesiologist your  ?surgery may need to be rescheduled.  ? ?  _X__ 1. Do not eat food or drink fluids after midnight the night before your procedure. ?                No chewing gum or hard candies. ? ?__X__2.  On the morning of surgery brush your teeth with toothpaste and water, you ?               may rinse your mouth with mouthwash if you wish.  Do not swallow any toothpaste or mouthwash. ?   ? _X__ 3.  No Alcohol for 24 hours before or after surgery. ? ? _X__ 4.  Do Not Smoke or use e-cigarettes For 24 Hours Prior to Your Surgery. ?                Do not use any chewable tobacco products for at least 6 hours prior to ?                Surgery. ? ?_X__  5.  Do not use any recreational drugs (marijuana, cocaine, heroin, ecstasy, MDMA or other) ?               For at least one week prior to your surgery.  Combination of these drugs with anesthesia ?               May have life threatening results. ? ?____  6.  Bring all medications with you on the day of surgery if instructed.  ? ?__X__  7.  Notify your doctor if there is any change in your medical condition  ?    (cold, fever, infections). ?    ?Do not wear jewelry, make-up, hairpins, clips or nail polish. ?Do not wear lotions, powders, or perfumes. You may wear deodorant. ?Do not shave 48 hours prior to surgery. Men may shave face and neck. ?Do not bring valuables to the hospital.   ? ?[facility] is not responsible for any belongings or valuables. ? ?Contacts, dentures or bridgework may not be worn into surgery. ?Leave  your suitcase in the car. After surgery it may be brought to your room. ?For patients admitted to the hospital, discharge time is determined by your ?treatment team. ?  ?Patients discharged the day of surgery will not be allowed to drive home.   ?Make arrangements for someone to be with you for the first 24 hours of your ?Same Day Discharge. ? ? ?__X__ Take these medicines the morning of surgery with A SIP OF WATER:  ? ? 1. None  ? 2.  ? 3.  ? 4. ? 5. ? 6. ? ?____ Fleet Enema (as directed)  ? ?____ Use CHG Soap (or wipes) as directed ? ?____ Use Benzoyl Peroxide Gel as instructed ? ?__X__ Use inhalers on the day of surgery ? COMBIVENT RESPIMAT 20-100 MCG/ACT AERS respimat ? ?____ Stop metformin 2 days prior to surgery   ? ?____ Take 1/2 of usual insulin dose the night before surgery. No insulin the  morning ?         of surgery.  ? ?____ Call your PCP, cardiologist, or Pulmonologist if taking Coumadin/Plavix/aspirin and ask when to stop before your surgery.  ? ?__X__ One Week prior to surgery- Stop Anti-inflammatories such as Ibuprofen, Aleve, Advil, Motrin, meloxicam (MOBIC), diclofenac, etodolac, ketorolac, Toradol, Daypro, piroxicam, Goody's or BC powders. OK TO USE TYLENOL IF NEEDED ?  ?__X__ One week prior to surgery-stop ALL supplements until after surgery.   ? ?____ Bring C-Pap to the hospital.  ? ? ?If you have any questions regarding your pre-procedure instructions,  ?Please call Pre-admit Testing at [phone] ?

## 2021-05-23 NOTE — Telephone Encounter (Signed)
Declined recalls doing well and not needed.  Deleting recall per request.  ?

## 2021-05-24 ENCOUNTER — Encounter
Admission: RE | Admit: 2021-05-24 | Discharge: 2021-05-24 | Disposition: A | Discharge status: Home / Self Care | Payer: Medicare Other | Source: Ambulatory Visit | Attending: Urology | Admitting: Urology

## 2021-05-24 DIAGNOSIS — J449 Chronic obstructive pulmonary disease, unspecified: Secondary | ICD-10-CM | POA: Insufficient documentation

## 2021-05-24 DIAGNOSIS — I6523 Occlusion and stenosis of bilateral carotid arteries: Secondary | ICD-10-CM | POA: Insufficient documentation

## 2021-05-24 DIAGNOSIS — Z01812 Encounter for preprocedural laboratory examination: Secondary | ICD-10-CM

## 2021-05-24 DIAGNOSIS — Z01818 Encounter for other preprocedural examination: Secondary | ICD-10-CM | POA: Diagnosis not present

## 2021-05-24 LAB — BASIC METABOLIC PANEL
Anion gap: 8 (ref 5–15)
BUN: 20 mg/dL (ref 8–23)
CO2: 28 mmol/L (ref 22–32)
Calcium: 9.1 mg/dL (ref 8.9–10.3)
Chloride: 95 mmol/L — ABNORMAL LOW (ref 98–111)
Creatinine, Ser: 0.9 mg/dL (ref 0.44–1.00)
GFR, Estimated: >60 mL/min (ref >60)
Glucose, Bld: 87 mg/dL (ref 70–99)
Potassium: 3.9 mmol/L (ref 3.5–5.1)
Sodium: 131 mmol/L — ABNORMAL LOW (ref 135–145)

## 2021-05-24 LAB — CBC
HCT: 36.3 % (ref 36.0–46.0)
Hemoglobin: 11.5 g/dL — ABNORMAL LOW (ref 12.0–15.0)
MCH: 30.5 pg (ref 26.0–34.0)
MCHC: 31.7 g/dL (ref 30.0–36.0)
MCV: 96.3 fL (ref 80.0–100.0)
Platelets: 458 10*3/uL — ABNORMAL HIGH (ref 150–400)
RBC: 3.77 MIL/uL — ABNORMAL LOW (ref 3.87–5.11)
RDW: 13.5 % (ref 11.5–15.5)
WBC: 8.2 10*3/uL (ref 4.0–10.5)
nRBC: 0 % (ref 0.0–0.2)

## 2021-05-28 ENCOUNTER — Other Ambulatory Visit: Payer: Self-pay

## 2021-05-28 MED ORDER — AMOXICILLIN-POT CLAVULANATE 875-125 MG PO TABS: 1.0000 | ORAL_TABLET | Freq: Two times a day (BID) | ORAL | 0 refills | Status: DC

## 2021-05-28 NOTE — Progress Notes (Signed)
Patient daughter called in today and states that antibiotic was stolen from patients room at her facility. I have sent additional medication so that patient can treat bacteria prior to surgery.  ?

## 2021-05-31 ENCOUNTER — Other Ambulatory Visit: Payer: Medicare Other

## 2021-06-01 ENCOUNTER — Encounter: Payer: Self-pay | Admitting: Urology

## 2021-06-01 ENCOUNTER — Encounter
Admission: RE | Disposition: A | Discharge status: Home / Self Care | Payer: Self-pay | Source: Home / Self Care | Attending: Urology

## 2021-06-01 ENCOUNTER — Ambulatory Visit: Payer: Medicare Other

## 2021-06-01 ENCOUNTER — Ambulatory Visit: Payer: Medicare Other | Admitting: Urgent Care

## 2021-06-01 ENCOUNTER — Ambulatory Visit
Admission: RE | Admit: 2021-06-01 | Discharge: 2021-06-01 | Disposition: A | Discharge status: Home / Self Care | Payer: Medicare Other | Attending: Urology | Admitting: Urology

## 2021-06-01 DIAGNOSIS — N135 Crossing vessel and stricture of ureter without hydronephrosis: Secondary | ICD-10-CM

## 2021-06-01 DIAGNOSIS — Z87891 Personal history of nicotine dependence: Secondary | ICD-10-CM | POA: Insufficient documentation

## 2021-06-01 DIAGNOSIS — J449 Chronic obstructive pulmonary disease, unspecified: Secondary | ICD-10-CM | POA: Diagnosis not present

## 2021-06-01 DIAGNOSIS — N130 Hydronephrosis with ureteropelvic junction obstruction: Secondary | ICD-10-CM | POA: Diagnosis not present

## 2021-06-01 DIAGNOSIS — E039 Hypothyroidism, unspecified: Secondary | ICD-10-CM | POA: Insufficient documentation

## 2021-06-01 DIAGNOSIS — N189 Chronic kidney disease, unspecified: Secondary | ICD-10-CM | POA: Insufficient documentation

## 2021-06-01 HISTORY — PX: CYSTOSCOPY W/ RETROGRADES: SHX1426

## 2021-06-01 HISTORY — PX: CYSTOSCOPY W/ URETERAL STENT PLACEMENT: SHX1429

## 2021-06-01 SURGERY — CYSTOSCOPY, FLEXIBLE, WITH STENT REPLACEMENT
Anesthesia: General | Laterality: Left

## 2021-06-01 MED ORDER — PROPOFOL 10 MG/ML IV BOLUS
INTRAVENOUS | Status: DC | PRN
  Administered 2021-06-01: 60 mg via INTRAVENOUS

## 2021-06-01 MED ORDER — IOHEXOL 180 MG/ML  SOLN
INTRAMUSCULAR | Status: DC | PRN
  Administered 2021-06-01: 7 mL

## 2021-06-01 MED ORDER — PROPOFOL 500 MG/50ML IV EMUL
INTRAVENOUS | Status: AC
  Filled 2021-06-01: qty 50

## 2021-06-01 MED ORDER — FENTANYL CITRATE (PF) 100 MCG/2ML IJ SOLN
INTRAMUSCULAR | Status: AC
  Filled 2021-06-01: qty 2

## 2021-06-01 MED ORDER — ONDANSETRON HCL 4 MG/2ML IJ SOLN
INTRAMUSCULAR | Status: DC | PRN
  Administered 2021-06-01: 4 mg via INTRAVENOUS

## 2021-06-01 MED ORDER — PROPOFOL 500 MG/50ML IV EMUL
INTRAVENOUS | Status: DC | PRN
Start: 2021-06-01 — End: 2021-06-01
  Administered 2021-06-01: 70 ug/kg/min via INTRAVENOUS

## 2021-06-01 MED ORDER — PROPOFOL 10 MG/ML IV BOLUS
INTRAVENOUS | Status: AC
  Filled 2021-06-01: qty 20

## 2021-06-01 MED ORDER — LIDOCAINE HCL (CARDIAC) PF 100 MG/5ML IV SOSY
PREFILLED_SYRINGE | INTRAVENOUS | Status: DC | PRN
  Administered 2021-06-01: 60 mg via INTRAVENOUS

## 2021-06-01 MED ORDER — OXYCODONE HCL 5 MG/5ML PO SOLN: 5.0000 mg | Freq: Once | ORAL | Status: DC | PRN

## 2021-06-01 MED ORDER — FAMOTIDINE 20 MG PO TABS: 20.0000 mg | ORAL_TABLET | Freq: Once | ORAL | Status: AC

## 2021-06-01 MED ORDER — ONDANSETRON HCL 4 MG/2ML IJ SOLN
INTRAMUSCULAR | Status: AC
  Filled 2021-06-01: qty 2

## 2021-06-01 MED ORDER — LACTATED RINGERS IV SOLN: INTRAVENOUS | Status: DC

## 2021-06-01 MED ORDER — ACETAMINOPHEN 10 MG/ML IV SOLN: 1000.0000 mg | Freq: Once | INTRAVENOUS | Status: DC | PRN

## 2021-06-01 MED ORDER — CEFAZOLIN SODIUM-DEXTROSE 1-4 GM/50ML-% IV SOLN
1.0000 g | INTRAVENOUS | Status: AC
  Administered 2021-06-01: 1 g via INTRAVENOUS

## 2021-06-01 MED ORDER — ORAL CARE MOUTH RINSE: 15.0000 mL | Freq: Once | OROMUCOSAL | Status: AC

## 2021-06-01 MED ORDER — LIDOCAINE HCL (PF) 2 % IJ SOLN
INTRAMUSCULAR | Status: AC
  Filled 2021-06-01: qty 5

## 2021-06-01 MED ORDER — CEFAZOLIN SODIUM-DEXTROSE 1-4 GM/50ML-% IV SOLN
INTRAVENOUS | Status: AC
  Filled 2021-06-01: qty 50

## 2021-06-01 MED ORDER — FENTANYL CITRATE (PF) 100 MCG/2ML IJ SOLN
INTRAMUSCULAR | Status: DC | PRN
  Administered 2021-06-01 (×2): 25 ug via INTRAVENOUS

## 2021-06-01 MED ORDER — CHLORHEXIDINE GLUCONATE 0.12 % MT SOLN
OROMUCOSAL | Status: AC
  Administered 2021-06-01: 15 mL via OROMUCOSAL
  Filled 2021-06-01: qty 15

## 2021-06-01 MED ORDER — CHLORHEXIDINE GLUCONATE 0.12 % MT SOLN: 15.0000 mL | Freq: Once | OROMUCOSAL | Status: AC

## 2021-06-01 MED ORDER — FAMOTIDINE 20 MG PO TABS
ORAL_TABLET | ORAL | Status: AC
  Administered 2021-06-01: 20 mg via ORAL
  Filled 2021-06-01: qty 1

## 2021-06-01 MED ORDER — SODIUM CHLORIDE 0.9 % IR SOLN
Status: DC | PRN
  Administered 2021-06-01: 3000 mL via INTRAVESICAL

## 2021-06-01 MED ORDER — OXYCODONE HCL 5 MG PO TABS: 5.0000 mg | ORAL_TABLET | Freq: Once | ORAL | Status: DC | PRN

## 2021-06-01 MED ORDER — FENTANYL CITRATE (PF) 100 MCG/2ML IJ SOLN: 25.0000 ug | INTRAMUSCULAR | Status: DC | PRN

## 2021-06-01 MED ORDER — ONDANSETRON HCL 4 MG/2ML IJ SOLN: 4.0000 mg | Freq: Once | INTRAMUSCULAR | Status: DC | PRN

## 2021-06-01 SURGICAL SUPPLY — 19 items
BAG DRAIN CYSTO-URO LG1000N (MISCELLANEOUS) ×2 IMPLANT
BRUSH SCRUB EZ 1% IODOPHOR (MISCELLANEOUS) ×1 IMPLANT
CATH URETL OPEN 5X70 (CATHETERS) ×2 IMPLANT
GAUZE 4X4 16PLY ~~LOC~~+RFID DBL (SPONGE) ×3 IMPLANT
GLOVE SURG UNDER POLY LF SZ7.5 (GLOVE) ×2 IMPLANT
GOWN STRL REUS W/ TWL LRG LVL3 (GOWN DISPOSABLE) ×1 IMPLANT
GOWN STRL REUS W/ TWL XL LVL3 (GOWN DISPOSABLE) ×1 IMPLANT
GOWN STRL REUS W/TWL LRG LVL3 (GOWN DISPOSABLE) ×1
GOWN STRL REUS W/TWL XL LVL3 (GOWN DISPOSABLE) ×1
GUIDEWIRE STR DUAL SENSOR (WIRE) ×2 IMPLANT
IV NS IRRIG 3000ML ARTHROMATIC (IV SOLUTION) ×2 IMPLANT
KIT TURNOVER CYSTO (KITS) ×2 IMPLANT
MANIFOLD NEPTUNE II (INSTRUMENTS) ×2 IMPLANT
PACK CYSTO AR (MISCELLANEOUS) ×2 IMPLANT
SET CYSTO W/LG BORE CLAMP LF (SET/KITS/TRAYS/PACK) ×2 IMPLANT
STENT URO INLAY 6FRX24CM (STENTS) ×1 IMPLANT
SURGILUBE 2OZ TUBE FLIPTOP (MISCELLANEOUS) ×2 IMPLANT
WATER STERILE IRR 1000ML POUR (IV SOLUTION) ×2 IMPLANT
WATER STERILE IRR 500ML POUR (IV SOLUTION) ×2 IMPLANT

## 2021-06-01 NOTE — H&P (Signed)
? ?06/01/21 ?11:43 AM  ? ?[place] ?August 23, 1928 ?[id number] ? ?CC: Chronic left UPJ obstruction ? ?HPI: ?88 year old female with chronic left UPJ obstruction who presented with flank pain and recurrent UTIs, and this has been managed with stent changes every 12 to 16 months.  She denies any fevers or chills and has been doing well. ? ? ?PMH: ?Past Medical History:  ?Diagnosis Date  ? Actinic keratosis   ? Adnexal mass 06/03/2014  ? since 2009  ? Arthritis   ? Chronic kidney disease   ? Complication of anesthesia   ? COPD (chronic obstructive pulmonary disease) ([place])   ? Diffuse large cell lymphoma in remission ([place]) 01/2007  ? NON-HODGKINS-stage 3, cd 20 positive; status post 6 cycles of R-CHOP  ? Dyspnea   ? with exertion  ? Herpes zoster without complication 93/57/0177  ? History of hiatal hernia   ? HOH (hard of hearing)   ? wears hearing aides  ? Mitral regurgitation   ? Non Hodgkin's lymphoma ([place])   ? Personal history of chemotherapy   ? PONV (postoperative nausea and vomiting)   ? in January lasted about 4 hours  ? Recurrent UTI   ? Squamous cell carcinoma of skin 11/09/2008  ? Right post. lat. elbow. SCCis arising in [place]. Excised 01/04/2009, margins free.   ? Squamous cell carcinoma of skin 07/26/2020  ? right hand  ? Syncope   ? ? ?Surgical History: ?Past Surgical History:  ?Procedure Laterality Date  ? ABDOMINAL SURGERY  2009  ? abdominal mass+ NH lymphoma,  ? BACK SURGERY  01/2017  ? fusion. metal plate in neck at back  ? CATARACT EXTRACTION  1997  ? right eye and left ey  ? cervical neck fusion  1995  ? CYSTOSCOPY W/ RETROGRADES Left 01/30/2018  ? Procedure: CYSTOSCOPY WITH RETROGRADE PYELOGRAM;  Surgeon: Billey Co, MD;  Location: [facility];  Service: Urology;  Laterality: Left;  ? CYSTOSCOPY W/ RETROGRADES Left 08/05/2018  ? Procedure: CYSTOSCOPY WITH RETROGRADE PYELOGRAM;  Surgeon: Billey Co, MD;  Location: [facility];  Service: Urology;  Laterality: Left;  ? CYSTOSCOPY W/ URETERAL STENT  PLACEMENT Left 08/05/2018  ? Procedure: CYSTOSCOPY WITH STENT Exchange;  Surgeon: Billey Co, MD;  Location: [facility];  Service: Urology;  Laterality: Left;  ? CYSTOSCOPY W/ URETERAL STENT PLACEMENT Left 05/21/2019  ? Procedure: CYSTOSCOPY WITH STENT REPLACEMENT;  Surgeon: Billey Co, MD;  Location: [facility];  Service: Urology;  Laterality: Left;  ? CYSTOSCOPY W/ URETERAL STENT PLACEMENT Left 05/05/2020  ? Procedure: CYSTOSCOPY WITH RETROGRADE PYELOGRAM/URETERAL STENT EXCHANGE;  Surgeon: Billey Co, MD;  Location: [facility];  Service: Urology;  Laterality: Left;  ? CYSTOSCOPY WITH BIOPSY Left 02/27/2018  ? Procedure: CYSTOSCOPY WITH BIOPSY;  Surgeon: Billey Co, MD;  Location: [facility];  Service: Urology;  Laterality: Left;  ? CYSTOSCOPY WITH STENT PLACEMENT Left 01/30/2018  ? Procedure: CYSTOSCOPY WITH STENT PLACEMENT;  Surgeon: Billey Co, MD;  Location: [facility];  Service: Urology;  Laterality: Left;  ? CYSTOSCOPY WITH URETEROSCOPY AND STENT PLACEMENT Left 02/27/2018  ? Procedure: CYSTOSCOPY WITH URETEROSCOPY AND STENT Exchange;  Surgeon: Billey Co, MD;  Location: [facility];  Service: Urology;  Laterality: Left;  ? DENTAL SURGERY    ? screws  ? JOINT REPLACEMENT Left   ? knee  ? KNEE ARTHROPLASTY Right 12/23/2018  ? Procedure: RIGHT COMPUTER ASSISTED TOTAL KNEE ARTHROPLASTY;  Surgeon: Dereck Leep, MD;  Location: [facility];  Service: Orthopedics;  Laterality: Right;  ? KYPHOPLASTY N/A 02/21/2017  ? Procedure: [id number];  Surgeon: Hessie Knows, MD;  Location: [facility];  Service: Orthopedics;  Laterality: N/A;  ? laparotomy with biopsy  03/02/2007  ? PORTACATH PLACEMENT  2009  ? [place]  ? SQUAMOUS CELL CARCINOMA EXCISION    ? right arm  ? TOTAL KNEE ARTHROPLASTY Left   ? 2009  ? URETEROSCOPY Left 01/30/2018  ? Procedure: URETEROSCOPY;  Surgeon: Billey Co, MD;  Location: [facility];  Service: Urology;  Laterality: Left;  ? VAGINAL HYSTERECTOMY  1971  ? ? ? ? ?Family  History: ?Family History  ?Problem Relation Age of Onset  ? Breast cancer Sister   ? Dementia Sister   ? Cataracts Sister   ? Heart attack Brother   ? CAD Brother   ? Heart attack Brother   ? Leukemia Grandchild   ?     granddaughter  ? Kidney disease Neg Hx   ? Bladder Cancer Neg Hx   ? ? ?Social History:  reports that she quit smoking about 31 years ago. Her smoking use included cigarettes. She smoked an average of .25 packs per day. She has never used smokeless tobacco. She reports that she does not currently use alcohol. She reports that she does not use drugs. ? ?Physical Exam: ?BP 125/75   Pulse 95   Temp (!) 97.5 ?F (36.4 ?C) (Temporal)   Resp 16   SpO2 100%   ? ?Constitutional:  Alert and oriented, No acute distress. ?Cardiovascular regular rate and rhythm ?Respiratory: Clear to auscultation bilaterally ?GI: Abdomen is soft, nontender, nondistended, no abdominal masses ? ? ?Laboratory Data: ?Urine culture 4/18 with Klebsiella and E. coli, treated with culture appropriate antibiotics ? ? ?Assessment & Plan:   ?88 year old female with chronic left UPJ obstructions managed with stent changes every 12 to 16 months ? ?We discussed risks of bleeding, infection, need for ongoing stent changes, risk of ureteral or kidney injury, and risk of recurrent infections ? ?Proceed with cystoscopy and left ureteral stent change today ? ?Nickolas Madrid, MD ?06/01/2021 ? ?[facility] ?[address] ?[place], [place] [address] ?[phone] ? ? ?

## 2021-06-01 NOTE — Anesthesia Preprocedure Evaluation (Addendum)
Anesthesia Evaluation  ?Patient identified by MRN, date of birth, ID band ?Patient awake ? ? ? ?Reviewed: ?Allergy & Precautions, NPO status , Patient's Chart, lab work & pertinent test results ? ?History of Anesthesia Complications ?(+) PONV and history of anesthetic complications ? ?Airway ?Mallampati: IV ? ? ?Neck ROM: Full ? ? ? Dental ? ?(+) Upper Dentures, Missing ?  ?Pulmonary ?COPD, former smoker (quit 1991),  ?  ?Pulmonary exam normal ?breath sounds clear to auscultation ? ? ? ? ? ? Cardiovascular ?Normal cardiovascular exam+ Valvular Problems/Murmurs MR  ?Rhythm:Regular Rate:Normal ? ?ECG 05/24/21:  ?Normal sinus rhythm ?Left axis deviation ?Abnormal ECG ?When compared with ECG of 19-Dec-2019 10:19, ?No significant change since last tracing ?  ?Neuro/Psych ?HOH; s/p c-spine fusion ?  ? GI/Hepatic ?hiatal hernia, GERD  ,  ?Endo/Other  ?Hypothyroidism  ? Renal/GU ?Renal disease (CKD, nephrolithiasis, UPJ obstruction)  ? ?  ?Musculoskeletal ? ?(+) Arthritis ,  ? Abdominal ?  ?Peds ? Hematology ?DLBCL s/p chemo   ?Anesthesia Other Findings ? ? Reproductive/Obstetrics ? ?  ? ? ? ? ? ? ? ? ? ? ? ? ? ?  ?  ? ? ? ? ? ? ? ?Anesthesia Physical ?Anesthesia Plan ? ?ASA: 2 ? ?Anesthesia Plan: General  ? ?Post-op Pain Management:   ? ?Induction: Intravenous ? ?PONV Risk Score and Plan: 4 or greater and Propofol infusion, TIVA, Treatment may vary due to age or medical condition and Ondansetron ? ?Airway Management Planned: Natural Airway ? ?Additional Equipment:  ? ?Intra-op Plan:  ? ?Post-operative Plan:  ? ?Informed Consent: I have reviewed the patients History and Physical, chart, labs and discussed the procedure including the risks, benefits and alternatives for the proposed anesthesia with the patient or authorized representative who has indicated his/her understanding and acceptance.  ? ? ? ? ? ?Plan Discussed with: CRNA ? ?Anesthesia Plan Comments: (LMA/GETA backup discussed.   Patient consented for risks of anesthesia including but not limited to:  ?- adverse reactions to medications ?- damage to eyes, teeth, lips or other oral mucosa ?- nerve damage due to positioning  ?- sore throat or hoarseness ?- damage to heart, brain, nerves, lungs, other parts of body or loss of life ? ?Informed patient about role of CRNA in peri- and intra-operative care.  Patient voiced understanding.)  ? ? ? ? ? ? ?Anesthesia Quick Evaluation ? ?

## 2021-06-01 NOTE — Op Note (Signed)
Date of procedure: 06/01/21 ? ?Preoperative diagnosis:  ?Left UPJ obstruction ? ?Postoperative diagnosis:  ?Same ? ?Procedure: ?Cystoscopy, left retrograde pyelogram with intraoperative interpretation, left ureteral stent change ? ?Surgeon: Nickolas Madrid, MD ? ?Anesthesia: General ? ?Complications: None ? ?Intraoperative findings:  ?Normal cystoscopy, left retrograde with minimal left hydronephrosis, uncomplicated stent change ? ?EBL: None ? ?Specimens: None ? ?Drains: Left 6 French by 24 cm Bard Optima ureteral stent ? ?Indication: [place] is a 88 y.o. patient with left UPJ obstruction that has been managed with yearly stent changes.  After reviewing the management options for treatment, they elected to proceed with the above surgical procedure(s). We have discussed the potential benefits and risks of the procedure, side effects of the proposed treatment, the likelihood of the patient achieving the goals of the procedure, and any potential problems that might occur during the procedure or recuperation. Informed consent has been obtained. ? ?Description of procedure: ? ?The patient was taken to the operating room and MAC was induced. SCDs were placed for DVT prophylaxis. The patient was placed in the dorsal lithotomy position, prepped and draped in the usual sterile fashion, and preoperative antibiotics(Ancef) were administered. A preoperative time-out was performed.  ? ?A 21 French rigid cystoscope was used to intubate the urethra and thorough cystoscopy was performed.  The bladder was grossly normal.  There was a stent emanating from the left ureteral orifice with minimal encrustation.  A sensor wire passed easily alongside the stent up to the kidney under fluoroscopic vision.  The stent was grasped and removed.  A 5 French access catheter advanced easily over the wire up to the kidney under fluoroscopic vision, and a retrograde pyelogram showed minimal left-sided hydronephrosis and no extravasation or  other significant abnormalities.  The access catheter was removed, and a 6 [place] by 24 cm Bard Optima stent was uneventfully placed over the wire fluoroscopically with an excellent curl in the renal pelvis, as well as in the bladder. Cystoscopy showed an excellent curl in the bladder with drainage of contrast through the side ports of the stent.  The bladder was drained and this concluded our procedure. ? ?Disposition: Stable to PACU ? ?Plan: ?Continue stent changes every 12 to 15 months ?Follow-up in clinic in 9 months ? ?Nickolas Madrid, MD ? ?

## 2021-06-01 NOTE — Transfer of Care (Signed)
Immediate Anesthesia Transfer of Care Note ? ?Patient: Monica Singh ? ?Procedure(s) Performed: CYSTOSCOPY WITH STENT EXCHANGE (Left) ?CYSTOSCOPY WITH RETROGRADE PYELOGRAM (Left) ? ?Patient Location: PACU ? ?Anesthesia Type:MAC ? ?Level of Consciousness: awake, alert  and oriented ? ?Airway & Oxygen Therapy: Patient Spontanous Breathing ? ?Post-op Assessment: Report given to RN and Post -op Vital signs reviewed and stable ? ?Post vital signs: Reviewed and stable ? ?Last Vitals:  ?Vitals Value Taken Time  ?BP 116/55 06/01/21 1224  ?Temp 36.6 ?C 06/01/21 1224  ?Pulse 87 06/01/21 1226  ?Resp 22 06/01/21 1226  ?SpO2 96 % 06/01/21 1226  ?Vitals shown include unvalidated device data. ? ?Last Pain:  ?Vitals:  ? 06/01/21 1113  ?TempSrc: Temporal  ?PainSc: 0-No pain  ?   ? ?  ? ?Complications: No notable events documented. ?

## 2021-06-01 NOTE — Anesthesia Postprocedure Evaluation (Signed)
Anesthesia Post Note ? ?Patient: Monica Singh ? ?Procedure(s) Performed: CYSTOSCOPY WITH STENT EXCHANGE (Left) ?CYSTOSCOPY WITH RETROGRADE PYELOGRAM (Left) ? ?Patient location during evaluation: PACU ?Anesthesia Type: General ?Level of consciousness: awake and alert, oriented and patient cooperative ?Pain management: pain level controlled ?Vital Signs Assessment: post-procedure vital signs reviewed and stable ?Respiratory status: spontaneous breathing, nonlabored ventilation and respiratory function stable ?Cardiovascular status: blood pressure returned to baseline and stable ?Postop Assessment: adequate PO intake ?Anesthetic complications: no ? ? ?No notable events documented. ? ? ?Last Vitals:  ?Vitals:  ? 06/01/21 1230 06/01/21 1245  ?BP: 134/64 136/64  ?Pulse: 82 78  ?Resp: 15 15  ?Temp:    ?SpO2: 94% 94%  ?  ?Last Pain:  ?Vitals:  ? 06/01/21 1245  ?TempSrc:   ?PainSc: 0-No pain  ? ? ?  ?  ?  ?  ?  ?  ? ?Darrin Nipper ? ? ? ? ?

## 2021-06-01 NOTE — Discharge Instructions (Signed)

## 2021-06-11 DIAGNOSIS — M5416 Radiculopathy, lumbar region: Secondary | ICD-10-CM | POA: Diagnosis not present

## 2021-06-11 DIAGNOSIS — M48062 Spinal stenosis, lumbar region with neurogenic claudication: Secondary | ICD-10-CM | POA: Diagnosis not present

## 2021-06-11 DIAGNOSIS — M5136 Other intervertebral disc degeneration, lumbar region: Secondary | ICD-10-CM | POA: Diagnosis not present

## 2021-07-02 ENCOUNTER — Ambulatory Visit: Payer: Self-pay

## 2021-07-02 NOTE — Telephone Encounter (Signed)
Pt is a resident at [facility] Pts daughter called and stated pt has a UTI / they would like the office to order a urine catch so that she can bring it by the office / Pt is doing this out of the norm and she usally is like this when she has a UTI/ please advise      Chief Complaint: Daughter reports mild confusion "and she does this with UTI." Symptoms: Above Frequency: Friday Pertinent Negatives: Patient denies fever Disposition: '[]'$ ED /'[]'$ Urgent Care (no appt availability in office) / '[x]'$ Appointment(In office/virtual)/ '[]'$  [facility]/ '[]'$ Home Care/ '[]'$ Refused Recommended Disposition /'[]'$ [facility]/ '[]'$  Follow-up with PCP Additional Notes:   Reason for Disposition  Urinating more frequently than usual (i.e., frequency)  Answer Assessment - Initial Assessment Questions 1. SYMPTOM: "What's the main symptom you're concerned about?" (e.g., frequency, incontinence)     Mild confusion 2. ONSET: "When did the  symptoms  start?"     Friday 3. PAIN: "Is there any pain?" If Yes, ask: "How bad is it?" (Scale: 1-10; mild, moderate, severe)     No 4. CAUSE: "What do you think is causing the symptoms?"     UTI 5. OTHER SYMPTOMS: "Do you have any other symptoms?" (e.g., fever, flank pain, blood in urine, pain with urination)     No 6. PREGNANCY: "Is there any chance you are pregnant?" "When was your last menstrual period?"     No  Protocols used: Urinary Symptoms-A-AH

## 2021-07-03 ENCOUNTER — Ambulatory Visit (INDEPENDENT_AMBULATORY_CARE_PROVIDER_SITE_OTHER): Payer: Medicare Other | Admitting: Physician Assistant

## 2021-07-03 VITALS — BP 142/84 | HR 85 | Ht 62.0 in | Wt 141.8 lb

## 2021-07-03 DIAGNOSIS — R410 Disorientation, unspecified: Secondary | ICD-10-CM | POA: Diagnosis not present

## 2021-07-03 DIAGNOSIS — N390 Urinary tract infection, site not specified: Secondary | ICD-10-CM

## 2021-07-03 DIAGNOSIS — N3001 Acute cystitis with hematuria: Secondary | ICD-10-CM | POA: Diagnosis not present

## 2021-07-03 LAB — POCT URINALYSIS DIPSTICK
Bilirubin, UA: NEGATIVE
Glucose, UA: NEGATIVE
Ketones, UA: NEGATIVE
Nitrite, UA: NEGATIVE
Protein, UA: NEGATIVE
Spec Grav, UA: <=1.005 — AB (ref 1.010–1.025)
Urobilinogen, UA: 0.2 E.U./dL
pH, UA: 6 (ref 5.0–8.0)

## 2021-07-03 MED ORDER — AMOXICILLIN-POT CLAVULANATE 875-125 MG PO TABS: 1.0000 | ORAL_TABLET | Freq: Two times a day (BID) | ORAL | 0 refills | Status: AC

## 2021-07-03 NOTE — Progress Notes (Signed)
    I,Sha'taria Tyson,acting as a Education administrator for [employer], [name].,have documented all relevant documentation on the behalf of Mikey Kirschner, [name],as directed by  Mikey Kirschner, [name] while in the presence of Mikey Kirschner, [name].   Acute Office Visit  Subjective:     [name], female    DOB: 10-09-1928, 88 y.o.   MRN: [id number]  Cc. Confusion x < 1 week  Monica Singh is a 88 y/o female who presents today with her daughter, her daughter has noticed some Monica confusion x 1 week. Forgetting where her pajamas are, etc. Monica Singh denies abdominal pain, dysuria, back pain, urinary frequency, hematuria.  Last month she had a cystoscopy and stent exchange with her urologist. She reports frequent UTIs, 10 episodes last year.      Objective:    Blood pressure (!) 142/84, pulse 85, height '5\' 2"'$  (1.575 m), weight 141 lb 12.8 oz (64.3 kg), SpO2 100 %.   Physical Exam Constitutional:      General: She is awake.     Appearance: She is well-developed.  HENT:     Head: Normocephalic.  Eyes:     Conjunctiva/sclera: Conjunctivae normal.  Cardiovascular:     Rate and Rhythm: Normal rate and regular rhythm.     Heart sounds: Normal heart sounds.  Pulmonary:     Effort: Pulmonary effort is normal.     Breath sounds: Normal breath sounds.  Abdominal:     General: There is no distension.     Palpations: Abdomen is soft.     Tenderness: There is no abdominal tenderness. There is no guarding or rebound.  Skin:    General: Skin is warm.  Neurological:     Mental Status: She is alert and oriented to person, place, and time.  Psychiatric:        Attention and Perception: Attention normal.        Mood and Affect: Mood normal.        Speech: Speech normal.        Behavior: Behavior is cooperative.    Results for orders placed or performed in visit on 07/03/21  POCT Urinalysis Dipstick  Result Value Ref Range   Color, UA light yellow    Clarity, UA cloudy    Glucose, UA  Negative Negative   Bilirubin, UA negative    Ketones, UA negative    Spec Grav, UA <=1.005 (A) 1.010 - 1.025   Blood, UA Moderate    pH, UA 6.0 5.0 - 8.0   Protein, UA Negative Negative   Urobilinogen, UA 0.2 0.2 or 1.0 E.U./dL   Nitrite, UA negative    Leukocytes, UA Moderate (2+) (A) Negative   Appearance     Odor loud         Assessment & Plan:   Acute cystitis UA + leuks, blood , ordered culture Rx augmentin 875 mg bid x 7 days based on last culture and sensitivity.  Allergy to cipro, bactrim (sulfa). Advised if no improvement w/ confusion to return to office If UTI occurs again w/in next 3 mo would refer back to urology.  I, Mikey Kirschner, [name] have reviewed all documentation for this visit. The documentation on 07/03/2021 for the exam, diagnosis, procedures, and orders are all accurate and complete.  Mikey Kirschner, [name] [facility] [address] [place], [place], [address] Office: [phone] Fax: [phone]

## 2021-07-06 LAB — URINE CULTURE

## 2021-07-23 DIAGNOSIS — M778 Other enthesopathies, not elsewhere classified: Secondary | ICD-10-CM | POA: Insufficient documentation

## 2021-07-23 DIAGNOSIS — S52502A Unspecified fracture of the lower end of left radius, initial encounter for closed fracture: Secondary | ICD-10-CM | POA: Diagnosis not present

## 2021-07-23 DIAGNOSIS — M659 Synovitis and tenosynovitis, unspecified: Secondary | ICD-10-CM | POA: Insufficient documentation

## 2021-07-23 DIAGNOSIS — M65932 Unspecified synovitis and tenosynovitis, left forearm: Secondary | ICD-10-CM | POA: Insufficient documentation

## 2021-07-23 DIAGNOSIS — M25532 Pain in left wrist: Secondary | ICD-10-CM | POA: Insufficient documentation

## 2021-07-25 DIAGNOSIS — S52502A Unspecified fracture of the lower end of left radius, initial encounter for closed fracture: Secondary | ICD-10-CM | POA: Diagnosis not present

## 2021-08-01 DIAGNOSIS — M25532 Pain in left wrist: Secondary | ICD-10-CM | POA: Diagnosis not present

## 2021-08-01 DIAGNOSIS — S52502D Unspecified fracture of the lower end of left radius, subsequent encounter for closed fracture with routine healing: Secondary | ICD-10-CM | POA: Diagnosis not present

## 2021-08-02 ENCOUNTER — Ambulatory Visit: Payer: Medicare Other | Admitting: Dermatology

## 2021-08-07 ENCOUNTER — Telehealth: Payer: Self-pay

## 2021-08-07 DIAGNOSIS — M25532 Pain in left wrist: Secondary | ICD-10-CM | POA: Diagnosis not present

## 2021-08-07 DIAGNOSIS — S52502D Unspecified fracture of the lower end of left radius, subsequent encounter for closed fracture with routine healing: Secondary | ICD-10-CM | POA: Diagnosis not present

## 2021-08-07 NOTE — Telephone Encounter (Signed)
Copied from [place] [id number]. Topic: General - Other >> Aug 07, 2021  4:15 PM Sabas Sous wrote: Reason for CRM:  Josie Dixon calling from [facility] requesting a fax of the office notes from the most recent [place] (June)  Fax request  Fax: [phone] Attn: Burman Nieves

## 2021-08-08 ENCOUNTER — Encounter: Payer: Self-pay | Admitting: Family Medicine

## 2021-08-09 DIAGNOSIS — E039 Hypothyroidism, unspecified: Secondary | ICD-10-CM | POA: Diagnosis not present

## 2021-08-09 DIAGNOSIS — Z9181 History of falling: Secondary | ICD-10-CM | POA: Diagnosis not present

## 2021-08-09 DIAGNOSIS — J449 Chronic obstructive pulmonary disease, unspecified: Secondary | ICD-10-CM | POA: Diagnosis not present

## 2021-08-09 DIAGNOSIS — M80032D Age-related osteoporosis with current pathological fracture, left forearm, subsequent encounter for fracture with routine healing: Secondary | ICD-10-CM | POA: Diagnosis not present

## 2021-08-09 DIAGNOSIS — D649 Anemia, unspecified: Secondary | ICD-10-CM | POA: Diagnosis not present

## 2021-08-09 DIAGNOSIS — I10 Essential (primary) hypertension: Secondary | ICD-10-CM | POA: Diagnosis not present

## 2021-08-09 DIAGNOSIS — E785 Hyperlipidemia, unspecified: Secondary | ICD-10-CM | POA: Diagnosis not present

## 2021-08-09 DIAGNOSIS — N3001 Acute cystitis with hematuria: Secondary | ICD-10-CM | POA: Diagnosis not present

## 2021-08-15 ENCOUNTER — Ambulatory Visit (INDEPENDENT_AMBULATORY_CARE_PROVIDER_SITE_OTHER): Payer: Medicare Other

## 2021-08-15 VITALS — Wt 141.0 lb

## 2021-08-15 DIAGNOSIS — Z0000 Encounter for general adult medical examination without abnormal findings: Secondary | ICD-10-CM | POA: Diagnosis not present

## 2021-08-15 NOTE — Progress Notes (Signed)
Virtual Visit via Telephone Note  I connected with  Selz on 08/15/21 at  1:30 PM EDT by telephone and verified that I am speaking with the correct person using two identifiers.  Location: Patient: home Provider: [facility] Persons participating in the virtual visit: [place]   I discussed the limitations, risks, security and privacy concerns of performing an evaluation and management service by telephone and the availability of in person appointments. The patient expressed understanding and agreed to proceed.  Interactive audio and video telecommunications were attempted between this nurse and patient, however failed, due to patient having technical difficulties OR patient did not have access to video capability.  We continued and completed visit with audio only.  Some vital signs may be absent or patient reported.   Dionisio David, LPN  Subjective:   Monica Singh is a 88 y.o. female who presents for Medicare Annual (Subsequent) preventive examination.  Review of Systems           Objective:    There were no vitals filed for this visit. There is no height or weight on file to calculate BMI.     06/01/2021   11:10 AM 05/23/2021   11:17 AM 12/08/2020    1:32 PM 05/05/2020    8:15 AM 04/25/2020    4:05 PM 12/19/2019    2:13 PM 12/19/2019   10:16 AM  Advanced Directives  Does Patient Have a Medical Advance Directive? Yes Yes No Yes Yes Yes Yes  Type of Advance Directive Living will;Healthcare Power of [place];Living will  [place];Living will [place];Living will Healthcare Power of [place]  Does patient want to make changes to medical advance directive? No - Patient declined   No - Patient declined  No - Patient declined No - Patient declined  Copy of [place] in Chart? No - copy requested   No - copy requested Yes - validated  most recent copy scanned in chart (See row information)  No - copy requested  Would patient like information on creating a medical advance directive?    No - Patient declined       Current Medications (verified) Outpatient Encounter Medications as of 08/15/2021  Medication Sig   COMBIVENT RESPIMAT 20-100 MCG/ACT AERS respimat INHALE 2 PUFFS BY MOUTH INTO THE LUNGS 3 TIMES DAILY (Patient taking differently: Inhale 2 puffs into the lungs 3 (three) times daily as needed for shortness of breath or wheezing.)   CRANBERRY PO Take 1 capsule by mouth daily.   ibuprofen (ADVIL) 200 MG tablet Take 400 mg by mouth every 6 (six) hours as needed for moderate pain.   estradiol (ESTRACE) 0.1 MG/GM vaginal cream Estrogen Cream Instruction Discard applicator Apply pea sized amount to tip of finger to urethra before bed. Wash hands well after application. Use Monday, Wednesday and Friday (Patient not taking: Reported on 6/59/9357)   folic acid (FOLVITE) 1 MG tablet Take 1 tablet (1 mg total) by mouth daily. (Patient not taking: Reported on 05/18/2021)   No facility-administered encounter medications on file as of 08/15/2021.    Allergies (verified) Ciprofloxacin, Milk-related compounds, Misc. 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 lat. elbow. SCCis arising in [place]. Excised 01/04/2009, margins free.    Squamous cell carcinoma of skin 07/26/2020   right hand   Syncope    Past Surgical History:  Procedure Laterality Date   ABDOMINAL SURGERY  2009   abdominal mass+ NH lymphoma,   BACK SURGERY  01/2017   fusion. metal plate in neck at back   [place]   right eye and left ey   cervical neck fusion  1995   CYSTOSCOPY W/ RETROGRADES Left 01/30/2018   Procedure: CYSTOSCOPY WITH RETROGRADE PYELOGRAM;  Surgeon: Billey Co, MD;  Location: [facility];  Service: Urology;  Laterality: Left;   CYSTOSCOPY W/ RETROGRADES Left 08/05/2018   Procedure: CYSTOSCOPY WITH RETROGRADE PYELOGRAM;  Surgeon: Billey Co, MD;  Location: [facility];  Service: Urology;  Laterality: Left;   CYSTOSCOPY W/ RETROGRADES Left 06/01/2021   Procedure: CYSTOSCOPY WITH RETROGRADE PYELOGRAM;  Surgeon: Billey Co, MD;  Location: [facility];  Service: Urology;  Laterality: Left;   CYSTOSCOPY W/ URETERAL STENT PLACEMENT Left 08/05/2018   Procedure: CYSTOSCOPY WITH STENT Exchange;  Surgeon: Billey Co, MD;  Location: [facility];  Service: Urology;  Laterality: Left;   CYSTOSCOPY W/ URETERAL STENT PLACEMENT Left 05/21/2019   Procedure: CYSTOSCOPY WITH STENT REPLACEMENT;  Surgeon: Billey Co, MD;  Location: [facility];  Service: Urology;  Laterality: Left;   CYSTOSCOPY W/ URETERAL STENT PLACEMENT Left 05/05/2020   Procedure: CYSTOSCOPY WITH RETROGRADE PYELOGRAM/URETERAL STENT EXCHANGE;  Surgeon: Billey Co, MD;  Location: [facility];  Service: Urology;  Laterality: Left;   CYSTOSCOPY W/ URETERAL STENT PLACEMENT Left 06/01/2021   Procedure: CYSTOSCOPY WITH STENT EXCHANGE;  Surgeon: Billey Co, MD;  Location: [facility];  Service: Urology;  Laterality: Left;   CYSTOSCOPY WITH BIOPSY Left 02/27/2018   Procedure: CYSTOSCOPY WITH  BIOPSY;  Surgeon: Billey Co, MD;  Location: [facility];  Service: Urology;  Laterality: Left;   CYSTOSCOPY WITH STENT PLACEMENT Left 01/30/2018   Procedure: CYSTOSCOPY WITH STENT PLACEMENT;  Surgeon: Billey Co, MD;  Location: [facility];  Service: Urology;  Laterality: Left;   CYSTOSCOPY WITH URETEROSCOPY AND STENT PLACEMENT Left 02/27/2018   Procedure: CYSTOSCOPY WITH URETEROSCOPY AND STENT Exchange;  Surgeon: Billey Co, MD;  Location: [facility];  Service: Urology;  Laterality: Left;   DENTAL SURGERY     screws   JOINT REPLACEMENT Left    knee   KNEE ARTHROPLASTY Right 12/23/2018   Procedure: RIGHT COMPUTER ASSISTED TOTAL KNEE ARTHROPLASTY;  Surgeon: Dereck Leep, MD;  Location: [facility];  Service: Orthopedics;  Laterality: Right;   KYPHOPLASTY N/A 02/21/2017   Procedure: ENIDPOEUMPN-T6;  Surgeon: Hessie Knows, MD;  Location: [facility];  Service: Orthopedics;  Laterality: N/A;   laparotomy with biopsy  03/02/2007   PORTACATH PLACEMENT  2009   SPINE SURGERY  1997   SQUAMOUS CELL CARCINOMA EXCISION     right arm   TOTAL KNEE ARTHROPLASTY Left    2009   URETEROSCOPY Left 01/30/2018   Procedure: URETEROSCOPY;  Surgeon: Billey Co, MD;  Location: [facility];  Service: Urology;  Laterality: Left;   VAGINAL HYSTERECTOMY  1971   Family History  Problem Relation Age of Onset   Breast cancer Sister    Dementia Sister    Cataracts Sister    Heart attack Brother    CAD Brother    Heart attack Brother    Leukemia Grandchild        granddaughter   Kidney disease Neg Hx    Bladder Cancer Neg Hx    Social History   Socioeconomic History   Marital status: Widowed    Spouse name: Not on file   Number of children: 5   Years of education: Not on file   Highest education level: GED or equivalent  Occupational History   Occupation: retired    Comment: homemaker  Tobacco Use   Smoking status: Former    Packs/day: 0.25    Types: Cigarettes    Quit date: 06/21/1989    Years  since quitting: 32.1   Smokeless tobacco: Never  Vaping Use   Vaping Use: Never used  Substance and Sexual Activity   Alcohol use: Not Currently    Comment: not anymore   Drug use: No   Sexual activity: Not Currently    Birth control/protection: Post-menopausal  Other Topics Concern   Not on file  Social History Narrative   Ambulates independently, lives at home by herself   Social Determinants of Health   Financial Resource Strain: Low Risk  (05/28/2017)   Overall Financial Resource Strain (CARDIA)    Difficulty of Paying Living Expenses: Not hard at all  Food Insecurity: No Food Insecurity (05/28/2017)   Hunger Vital Sign    Worried About Running Out of Food in the Last Year: Never true    [facility] in the Last Year: Never true  Transportation Needs: No Transportation Needs (05/28/2017)   PRAPARE - Hydrologist (Medical): No    Lack of Transportation (Non-Medical): No  Physical Activity: Insufficiently Active (08/15/2021)   Exercise Vital Sign    Days of Exercise per Week: 7 days    Minutes of Exercise per Session: 20 min  Stress: No Stress Concern Present (08/15/2021)   [place]    Feeling of Stress : Not at all  Social Connections: Moderately Integrated (02/10/2018)   Social Connection and Isolation Panel [NHANES]    Frequency of Communication with Friends and Family: More than three times a week    Frequency of Social Gatherings with Friends and Family: More than three times a week    Attends Religious Services: More than 4 times per year    Active Member of [employer] or Organizations: Yes    Attends Archivist Meetings: More than 4 times per year    Marital Status: Widowed    Tobacco Counseling Counseling given: Not Answered   Clinical Intake:  Pre-visit preparation completed: Yes  Pain : No/denies pain     Nutritional Risks: None Diabetes: No  How often  do you need to have someone help you when you read instructions, pamphlets, or other written materials from your doctor or pharmacy?: 1 - Never  Diabetic?no  Interpreter Needed?: No  Information entered by :: Kirke Shaggy, LPN   Activities of Daily Living    05/23/2021   11:19 AM 05/23/2021   11:15 AM  In your present state of health, do you have any difficulty performing the following activities:  Hearing?  0  Vision?  0  Difficulty concentrating or making decisions?  0  Walking or climbing stairs?  1  Comment  uses a walker  Dressing or bathing?  1  Comment  daughter helps patient with bathing and dressing  Doing errands, shopping? 0     Patient Care Team: Birdie Sons, MD as PCP - General (Family Medicine) End, Harrell Gave, MD as PCP - Cardiology (Cardiology) Estill Cotta, MD as Consulting Physician (Ophthalmology) Billey Co, MD as Consulting Physician (Urology)  Indicate any recent Medical Services you may have received from other than [facility] providers in the past year (date may be approximate).     Assessment:   This is a routine wellness examination for [place].  Hearing/Vision screen No results found.  Dietary issues and exercise activities discussed:     Goals Addressed             This Visit's Progress    DIET - EAT MORE FRUITS AND VEGETABLES         Depression Screen    08/15/2021    1:33 PM 06/15/2018    2:09 PM 06/15/2018    2:06 PM 05/28/2017    3:14 PM 06/07/2016    9:52 AM 06/07/2016    9:50 AM 10/12/2014   10:33 AM  PHQ 2/9 Scores  PHQ - 2 Score 0 0 0 0 0 0 0  PHQ- 9 Score 0 3   0      Fall Risk    06/15/2018    2:06 PM 05/28/2017    3:14 PM 06/07/2016    9:50 AM 11/20/2015   11:26 AM 10/12/2014   10:33 AM  Fall Risk   Falls in the past year? 1 Yes No No No  Number falls in past yr: 1 1     Injury with Fall?  Yes     Comment  broke vertabrae     Follow up Falls prevention discussed Falls prevention discussed        FALL RISK PREVENTION PERTAINING TO THE HOME:  Any stairs in or around the home? No  If so, are there any without handrails? No  Home free of loose throw rugs in walkways, pet beds, electrical cords, etc? Yes  Adequate lighting in your home to reduce risk of falls? Yes   ASSISTIVE DEVICES UTILIZED TO PREVENT FALLS:  Life alert? No  Use of a cane, walker or w/c? Yes  Grab bars in the bathroom? Yes  Shower chair or bench in shower? Yes  Elevated toilet seat or a handicapped toilet? Yes    Cognitive Function:declined        06/15/2018    2:15 PM 05/28/2017    3:20 PM 06/07/2016    9:53 AM  6CIT Screen  What Year? 0 points 0 points 0 points  What month? 0 points 0 points 0 points  What time? 0 points 0 points 0 points  Count back from 20 0 points 0 points 0 points  Months in reverse 0 points 0 points 0 points  Repeat phrase 0 points 4 points 2 points  Total Score 0 points 4 points 2 points    Immunizations Immunization History  Administered Date(s) Administered  Influenza Split 12/07/2008, 10/17/2010   Influenza, High Dose Seasonal PF 10/19/2013, 10/12/2014, 11/20/2015, 11/12/2016, 11/21/2017, 11/06/2018   Influenza,inj,Quad PF,6+ Mos 11/04/2012   Influenza-Unspecified 09/28/2013, 11/06/2018   PFIZER(Purple Top)SARS-COV-2 Vaccination 02/02/2019, 02/23/2019   Pneumococcal Conjugate-13 12/21/2012   Pneumococcal Polysaccharide-23 12/02/1997   Tdap 10/17/2010   Zoster, Live 10/14/2008    TDAP status: Due, Education has been provided regarding the importance of this vaccine. Advised may receive this vaccine at local pharmacy or Health Dept. Aware to provide a copy of the vaccination record if obtained from local pharmacy or Health Dept. Verbalized acceptance and understanding.  Flu Vaccine status: Declined, Education has been provided regarding the importance of this vaccine but patient still declined. Advised may receive this vaccine at local pharmacy or Health Dept. Aware  to provide a copy of the vaccination record if obtained from local pharmacy or Health Dept. Verbalized acceptance and understanding.  Pneumococcal vaccine status: Up to date  Covid-19 vaccine status: Completed vaccines  Qualifies for Shingles Vaccine? Yes   Zostavax completed Yes   Shingrix Completed?: No.    Education has been provided regarding the importance of this vaccine. Patient has been advised to call insurance company to determine out of pocket expense if they have not yet received this vaccine. Advised may also receive vaccine at local pharmacy or Health Dept. Verbalized acceptance and understanding.  Screening Tests Health Maintenance  Topic Date Due   Zoster Vaccines- Shingrix (1 of 2) Never done   COVID-19 Vaccine (3 - Pfizer risk series) 03/23/2019   DEXA SCAN  07/21/2020   TETANUS/TDAP  10/16/2020   INFLUENZA VACCINE  08/28/2021   Pneumonia Vaccine 54+ Years old  Completed   HPV VACCINES  Aged Out    Health Maintenance  Health Maintenance Due  Topic Date Due   Zoster Vaccines- Shingrix (1 of 2) Never done   COVID-19 Vaccine (3 - Pfizer risk series) 03/23/2019   DEXA SCAN  07/21/2020   TETANUS/TDAP  10/16/2020    Colorectal cancer screening: No longer required.   Mammogram status: No longer required due to age.    Lung Cancer Screening: (Low Dose CT Chest recommended if Age 51-80 years, 30 pack-year currently smoking OR have quit w/in 15years.) does not qualify.   Additional Screening:  Hepatitis C Screening: does not qualify; Completed no  Vision Screening: Recommended annual ophthalmology exams for early detection of glaucoma and other disorders of the eye. Is the patient up to date with their annual eye exam?  Yes  Who is the provider or what is the name of the office in which the patient attends annual eye exams? [place] If pt is not established with a provider, would they like to be referred to a provider to establish care? No .   Dental  Screening: Recommended annual dental exams for proper oral hygiene  Community Resource Referral / Chronic Care Management: CRR required this visit?  No   CCM required this visit?  No      Plan:     I have personally reviewed and noted the following in the patient's chart:   Medical and social history Use of alcohol, tobacco or illicit drugs  Current medications and supplements including opioid prescriptions.  Functional ability and status Nutritional status Physical activity Advanced directives List of other physicians Hospitalizations, surgeries, and ER visits in previous 12 months Vitals Screenings to include cognitive, depression, and falls Referrals and appointments  In addition, I have reviewed and discussed with patient certain preventive protocols,  quality metrics, and best practice recommendations. A written personalized care plan for preventive services as well as general preventive health recommendations were provided to patient.     Dionisio David, LPN   02/25/2079   Nurse Notes: none

## 2021-08-15 NOTE — Patient Instructions (Signed)
Monica Singh , Thank you for taking time to come for your Medicare Wellness Visit. I appreciate your ongoing commitment to your health goals. Please review the following plan we discussed and let me know if I can assist you in the future.   Screening recommendations/referrals: Colonoscopy: aged out Mammogram: aged out Bone Density: aged out Recommended yearly ophthalmology/optometry visit for glaucoma screening and checkup Recommended yearly dental visit for hygiene and checkup  Vaccinations: Influenza vaccine: n/d Pneumococcal vaccine: 12/21/12 Tdap vaccine: 10/17/10, due if have injury Shingles vaccine: Zostavax 10/14/08   Covid-19:02/02/19, 02/23/19  Advanced directives: yes  Conditions/risks identified: no  Next appointment: Follow up in one year for your annual wellness visit 08/19/22 @ 3pm by phone   Preventive Care 86 Years and Older, Female Preventive care refers to lifestyle choices and visits with your health care provider that can promote health and wellness. What does preventive care include? A yearly physical exam. This is also called an annual well check. Dental exams once or twice a year. Routine eye exams. Ask your health care provider how often you should have your eyes checked. Personal lifestyle choices, including: Daily care of your teeth and gums. Regular physical activity. Eating a healthy diet. Avoiding tobacco and drug use. Limiting alcohol use. Practicing safe sex. Taking low-dose aspirin every day. Taking vitamin and mineral supplements as recommended by your health care provider. What happens during an annual well check? The services and screenings done by your health care provider during your annual well check will depend on your age, overall health, lifestyle risk factors, and family history of disease. Counseling  Your health care provider may ask you questions about your: Alcohol use. Tobacco use. Drug use. Emotional well-being. Home and  relationship well-being. Sexual activity. Eating habits. History of falls. Memory and ability to understand (cognition). Work and work Statistician. Reproductive health. Screening  You may have the following tests or measurements: Height, weight, and BMI. Blood pressure. Lipid and cholesterol levels. These may be checked every 5 years, or more frequently if you are over 68 years old. Skin check. Lung cancer screening. You may have this screening every year starting at age 57 if you have a 30-pack-year history of smoking and currently smoke or have quit within the past 15 years. Fecal occult blood test (FOBT) of the stool. You may have this test every year starting at age 69. Flexible sigmoidoscopy or colonoscopy. You may have a sigmoidoscopy every 5 years or a colonoscopy every 10 years starting at age 63. Hepatitis C blood test. Hepatitis B blood test. Sexually transmitted disease (STD) testing. Diabetes screening. This is done by checking your blood sugar (glucose) after you have not eaten for a while (fasting). You may have this done every 1-3 years. Bone density scan. This is done to screen for osteoporosis. You may have this done starting at age 94. Mammogram. This may be done every 1-2 years. Talk to your health care provider about how often you should have regular mammograms. Talk with your health care provider about your test results, treatment options, and if necessary, the need for more tests. Vaccines  Your health care provider may recommend certain vaccines, such as: Influenza vaccine. This is recommended every year. Tetanus, diphtheria, and acellular pertussis (Tdap, Td) vaccine. You may need a Td booster every 10 years. Zoster vaccine. You may need this after age 65. Pneumococcal 13-valent conjugate (PCV13) vaccine. One dose is recommended after age 47. Pneumococcal polysaccharide (PPSV23) vaccine. One dose is recommended after age  17. Talk to your health care provider  about which screenings and vaccines you need and how often you need them. This information is not intended to replace advice given to you by your health care provider. Make sure you discuss any questions you have with your health care provider. Document Released: 02/10/2015 Document Revised: 10/04/2015 Document Reviewed: 11/15/2014 Elsevier Interactive Patient Education  2017 [place] Prevention in the Home Falls can cause injuries. They can happen to people of all ages. There are many things you can do to make your home safe and to help prevent falls. What can I do on the outside of my home? Regularly fix the edges of walkways and driveways and fix any cracks. Remove anything that might make you trip as you walk through a door, such as a raised step or threshold. Trim any bushes or trees on the path to your home. Use bright outdoor lighting. Clear any walking paths of anything that might make someone trip, such as rocks or tools. Regularly check to see if handrails are loose or broken. Make sure that both sides of any steps have handrails. Any raised decks and porches should have guardrails on the edges. Have any leaves, snow, or ice cleared regularly. Use sand or salt on walking paths during winter. Clean up any spills in your garage right away. This includes oil or grease spills. What can I do in the bathroom? Use night lights. Install grab bars by the toilet and in the tub and shower. Do not use towel bars as grab bars. Use non-skid mats or decals in the tub or shower. If you need to sit down in the shower, use a plastic, non-slip stool. Keep the floor dry. Clean up any water that spills on the floor as soon as it happens. Remove soap buildup in the tub or shower regularly. Attach bath mats securely with double-sided non-slip rug tape. Do not have throw rugs and other things on the floor that can make you trip. What can I do in the bedroom? Use night lights. Make sure  that you have a light by your bed that is easy to reach. Do not use any sheets or blankets that are too big for your bed. They should not hang down onto the floor. Have a firm chair that has side arms. You can use this for support while you get dressed. Do not have throw rugs and other things on the floor that can make you trip. What can I do in the kitchen? Clean up any spills right away. Avoid walking on wet floors. Keep items that you use a lot in easy-to-reach places. If you need to reach something above you, use a strong step stool that has a grab bar. Keep electrical cords out of the way. Do not use floor polish or wax that makes floors slippery. If you must use wax, use non-skid floor wax. Do not have throw rugs and other things on the floor that can make you trip. What can I do with my stairs? Do not leave any items on the stairs. Make sure that there are handrails on both sides of the stairs and use them. Fix handrails that are broken or loose. Make sure that handrails are as long as the stairways. Check any carpeting to make sure that it is firmly attached to the stairs. Fix any carpet that is loose or worn. Avoid having throw rugs at the top or bottom of the stairs. If you do have  throw rugs, attach them to the floor with carpet tape. Make sure that you have a light switch at the top of the stairs and the bottom of the stairs. If you do not have them, ask someone to add them for you. What else can I do to help prevent falls? Wear shoes that: Do not have high heels. Have rubber bottoms. Are comfortable and fit you well. Are closed at the toe. Do not wear sandals. If you use a stepladder: Make sure that it is fully opened. Do not climb a closed stepladder. Make sure that both sides of the stepladder are locked into place. Ask someone to hold it for you, if possible. Clearly mark and make sure that you can see: Any grab bars or handrails. First and last steps. Where the edge of  each step is. Use tools that help you move around (mobility aids) if they are needed. These include: Canes. Walkers. Scooters. Crutches. Turn on the lights when you go into a dark area. Replace any light bulbs as soon as they burn out. Set up your furniture so you have a clear path. Avoid moving your furniture around. If any of your floors are uneven, fix them. If there are any pets around you, be aware of where they are. Review your medicines with your doctor. Some medicines can make you feel dizzy. This can increase your chance of falling. Ask your doctor what other things that you can do to help prevent falls. This information is not intended to replace advice given to you by your health care provider. Make sure you discuss any questions you have with your health care provider. Document Released: 11/10/2008 Document Revised: 06/22/2015 Document Reviewed: 02/18/2014 Elsevier Interactive Patient Education  2017 [employer].

## 2021-08-21 DIAGNOSIS — M80032D Age-related osteoporosis with current pathological fracture, left forearm, subsequent encounter for fracture with routine healing: Secondary | ICD-10-CM | POA: Diagnosis not present

## 2021-08-21 DIAGNOSIS — J449 Chronic obstructive pulmonary disease, unspecified: Secondary | ICD-10-CM | POA: Diagnosis not present

## 2021-08-21 DIAGNOSIS — N3001 Acute cystitis with hematuria: Secondary | ICD-10-CM | POA: Diagnosis not present

## 2021-08-21 DIAGNOSIS — E039 Hypothyroidism, unspecified: Secondary | ICD-10-CM | POA: Diagnosis not present

## 2021-08-21 DIAGNOSIS — Z9181 History of falling: Secondary | ICD-10-CM | POA: Diagnosis not present

## 2021-08-21 DIAGNOSIS — I10 Essential (primary) hypertension: Secondary | ICD-10-CM | POA: Diagnosis not present

## 2021-08-21 DIAGNOSIS — D649 Anemia, unspecified: Secondary | ICD-10-CM | POA: Diagnosis not present

## 2021-08-21 DIAGNOSIS — E785 Hyperlipidemia, unspecified: Secondary | ICD-10-CM | POA: Diagnosis not present

## 2021-08-23 ENCOUNTER — Ambulatory Visit: Payer: Self-pay | Admitting: *Deleted

## 2021-08-23 DIAGNOSIS — M80032D Age-related osteoporosis with current pathological fracture, left forearm, subsequent encounter for fracture with routine healing: Secondary | ICD-10-CM | POA: Diagnosis not present

## 2021-08-23 DIAGNOSIS — E785 Hyperlipidemia, unspecified: Secondary | ICD-10-CM | POA: Diagnosis not present

## 2021-08-23 DIAGNOSIS — J449 Chronic obstructive pulmonary disease, unspecified: Secondary | ICD-10-CM | POA: Diagnosis not present

## 2021-08-23 DIAGNOSIS — I10 Essential (primary) hypertension: Secondary | ICD-10-CM | POA: Diagnosis not present

## 2021-08-23 DIAGNOSIS — N3001 Acute cystitis with hematuria: Secondary | ICD-10-CM | POA: Diagnosis not present

## 2021-08-23 DIAGNOSIS — Z9181 History of falling: Secondary | ICD-10-CM | POA: Diagnosis not present

## 2021-08-23 DIAGNOSIS — E039 Hypothyroidism, unspecified: Secondary | ICD-10-CM | POA: Diagnosis not present

## 2021-08-23 DIAGNOSIS — D649 Anemia, unspecified: Secondary | ICD-10-CM | POA: Diagnosis not present

## 2021-08-23 NOTE — Telephone Encounter (Signed)
Reason for Disposition  All other urine symptoms    Way know she has UTI is her behavior.    And her behavior is her usual for when she has UTI.  Urinating more frequently than usual (i.e., frequency)  Answer Assessment - Initial Assessment Questions 1. SYMPTOM: "What's the main symptom you're concerned about?" (e.g., frequency, incontinence)     Monica Singh daughter calling in.  I'm not with her.   At her age she doesn't have symptoms of UTI we watch her actions.    She is beginning to have trouble with her memory which is not typical for her except when she has a UTI.     I suspect she has a UTI by her actions.    2. ONSET: "When did the  not feeling good  start?"     I noticed her memory being off yesterday and today she is worse.   3. PAIN: "Is there any pain?" If Yes, ask: "How bad is it?" (Scale: 1-10; mild, moderate, severe)     She doesn't c/o pain or symptoms but that is normal for her. She broke her arm 5 weeks ago and didn't complain.   We took her to the dr and had it x-rayed and it was broken.   That's how she is.   Doesn't complain about much  of anything.   We have to watch for subtle changes in her behavior.     She has defecated on herself one too which is not usual for her.   It's happened in the past too when she is sick. 4. CAUSE: "What do you think is causing the symptoms?"     UTI 5. OTHER SYMPTOMS: "Do you have any other symptoms?" (e.g., blood in urine, fever, flank pain, pain with urination)     Defecated on herself once. 6. PREGNANCY: "Is there any chance you are pregnant?" "When was your last menstrual period?"     N/A  Protocols used: Urinary Symptoms-A-AH

## 2021-08-23 NOTE — Telephone Encounter (Addendum)
  Chief Complaint: UTI symptoms     memory deficit Symptoms: memory deficits which is what she gets with a UTI Frequency: N/A Pertinent Negatives: Patient denies N/A Disposition: '[]'$ ED /'[x]'$ Urgent Care (no appt availability in office) / '[]'$ Appointment(In office/virtual)/ '[]'$  [place] [facility]/ '[]'$ Home Care/ '[]'$ Refused Recommended Disposition /'[]'$ [place] [facility]/ '[]'$  Follow-up with PCP Additional Notes:  Going to [employer] in Clinic.   Family taking her.

## 2021-08-27 DIAGNOSIS — I10 Essential (primary) hypertension: Secondary | ICD-10-CM | POA: Diagnosis not present

## 2021-08-27 DIAGNOSIS — E785 Hyperlipidemia, unspecified: Secondary | ICD-10-CM | POA: Diagnosis not present

## 2021-08-27 DIAGNOSIS — Z9181 History of falling: Secondary | ICD-10-CM | POA: Diagnosis not present

## 2021-08-27 DIAGNOSIS — E039 Hypothyroidism, unspecified: Secondary | ICD-10-CM | POA: Diagnosis not present

## 2021-08-27 DIAGNOSIS — J449 Chronic obstructive pulmonary disease, unspecified: Secondary | ICD-10-CM | POA: Diagnosis not present

## 2021-08-27 DIAGNOSIS — D649 Anemia, unspecified: Secondary | ICD-10-CM | POA: Diagnosis not present

## 2021-08-27 DIAGNOSIS — M80032D Age-related osteoporosis with current pathological fracture, left forearm, subsequent encounter for fracture with routine healing: Secondary | ICD-10-CM | POA: Diagnosis not present

## 2021-08-27 DIAGNOSIS — N3001 Acute cystitis with hematuria: Secondary | ICD-10-CM | POA: Diagnosis not present

## 2021-08-28 ENCOUNTER — Encounter: Payer: Self-pay | Admitting: Family Medicine

## 2021-08-28 ENCOUNTER — Ambulatory Visit (INDEPENDENT_AMBULATORY_CARE_PROVIDER_SITE_OTHER): Payer: Medicare Other | Admitting: Family Medicine

## 2021-08-28 VITALS — BP 135/75 | HR 82 | Resp 16 | Wt 144.0 lb

## 2021-08-28 DIAGNOSIS — S52502D Unspecified fracture of the lower end of left radius, subsequent encounter for closed fracture with routine healing: Secondary | ICD-10-CM | POA: Diagnosis not present

## 2021-08-28 DIAGNOSIS — E039 Hypothyroidism, unspecified: Secondary | ICD-10-CM | POA: Diagnosis not present

## 2021-08-28 DIAGNOSIS — R319 Hematuria, unspecified: Secondary | ICD-10-CM | POA: Diagnosis not present

## 2021-08-28 DIAGNOSIS — N3001 Acute cystitis with hematuria: Secondary | ICD-10-CM | POA: Diagnosis not present

## 2021-08-28 DIAGNOSIS — M80032D Age-related osteoporosis with current pathological fracture, left forearm, subsequent encounter for fracture with routine healing: Secondary | ICD-10-CM | POA: Diagnosis not present

## 2021-08-28 DIAGNOSIS — N390 Urinary tract infection, site not specified: Secondary | ICD-10-CM | POA: Diagnosis not present

## 2021-08-28 DIAGNOSIS — J449 Chronic obstructive pulmonary disease, unspecified: Secondary | ICD-10-CM

## 2021-08-28 DIAGNOSIS — I10 Essential (primary) hypertension: Secondary | ICD-10-CM | POA: Diagnosis not present

## 2021-08-28 DIAGNOSIS — Z9181 History of falling: Secondary | ICD-10-CM | POA: Diagnosis not present

## 2021-08-28 DIAGNOSIS — E785 Hyperlipidemia, unspecified: Secondary | ICD-10-CM | POA: Diagnosis not present

## 2021-08-28 DIAGNOSIS — D649 Anemia, unspecified: Secondary | ICD-10-CM | POA: Diagnosis not present

## 2021-08-28 LAB — POCT URINALYSIS DIPSTICK
Bilirubin, UA: NEGATIVE
Glucose, UA: NEGATIVE
Ketones, UA: NEGATIVE
Nitrite, UA: POSITIVE
Protein, UA: NEGATIVE
Spec Grav, UA: 1.015 (ref 1.010–1.025)
Urobilinogen, UA: 0.2 E.U./dL
pH, UA: 6.5 (ref 5.0–8.0)

## 2021-08-28 MED ORDER — PREDNISONE 10 MG (21) PO TBPK: ORAL_TABLET | ORAL | 0 refills | Status: DC

## 2021-08-28 MED ORDER — AMOXICILLIN-POT CLAVULANATE 875-125 MG PO TABS: 1.0000 | ORAL_TABLET | Freq: Two times a day (BID) | ORAL | 0 refills | Status: AC

## 2021-08-28 NOTE — Assessment & Plan Note (Signed)
UA with large leuks and positive nitrites. Will send for culture. Rx augmentin x 5 days. Already established with urology.

## 2021-08-28 NOTE — Assessment & Plan Note (Addendum)
With current exacerbation likely d/t recent URI. Will provide steroid taper. Continue inhaler.

## 2021-08-28 NOTE — Progress Notes (Signed)
    SUBJECTIVE:   CHIEF COMPLAINT / HPI:   UPPER RESPIRATORY TRACT INFECTION - productive cough, grayish. - since last Friday, about 5 days - having to use inhaler more recently  Fever: no Cough: yes Shortness of breath:  some Wheezing: yes Chest pain: no Chest tightness: no Chest congestion: yes Nasal congestion: yes Runny nose: yes Post nasal drip: yes Sore throat: yes Sinus pressure: yes Headache: no Vomiting: no Sick contacts: no Context: stable Relief with OTC cold/cough medications: hasn't tried  Treatments attempted: none   URINARY SYMPTOMS - memory deficit and cursing, usually happens with UTI - h/o recurrent UTI, sees Urology. Dysuria: no Urinary frequency: no Urgency: no Foul odor: yes Hematuria: no Abdominal pain: no Back pain: no Suprapubic pain/pressure: no Flank pain: no Fever:  no Vomiting: no Status: stable Previous urinary tract infection: yes Recurrent urinary tract infection: yes Treatments attempted: none    OBJECTIVE:   BP 135/75 (BP Location: Left Arm, Patient Position: Sitting, Cuff Size: Normal)   Pulse 82   Resp 16   Wt 144 lb (65.3 kg)   SpO2 95%   BMI 26.34 kg/m   Gen: elderly, well appearing, in NAD Card: RRR Lungs: CTAB. No wheeze, rales, rhonchi. Appropriately saturated on RA Ext: WWP, no edema   ASSESSMENT/PLAN:   COPD (chronic obstructive pulmonary disease) ([place]) With current exacerbation likely d/t recent URI. Will provide steroid taper. Continue inhaler.   Frequent UTI UA with large leuks and positive nitrites. Will send for culture. Rx augmentin x 5 days. Already established with urology.      Myles Gip, DO

## 2021-08-29 LAB — URINALYSIS, MICROSCOPIC ONLY: WBC, UA: >30 /hpf — AB (ref 0–5)

## 2021-08-29 LAB — SPECIMEN STATUS REPORT

## 2021-08-30 DIAGNOSIS — E785 Hyperlipidemia, unspecified: Secondary | ICD-10-CM | POA: Diagnosis not present

## 2021-08-30 DIAGNOSIS — J449 Chronic obstructive pulmonary disease, unspecified: Secondary | ICD-10-CM | POA: Diagnosis not present

## 2021-08-30 DIAGNOSIS — E039 Hypothyroidism, unspecified: Secondary | ICD-10-CM | POA: Diagnosis not present

## 2021-08-30 DIAGNOSIS — Z9181 History of falling: Secondary | ICD-10-CM | POA: Diagnosis not present

## 2021-08-30 DIAGNOSIS — I10 Essential (primary) hypertension: Secondary | ICD-10-CM | POA: Diagnosis not present

## 2021-08-30 DIAGNOSIS — N3001 Acute cystitis with hematuria: Secondary | ICD-10-CM | POA: Diagnosis not present

## 2021-08-30 DIAGNOSIS — M80032D Age-related osteoporosis with current pathological fracture, left forearm, subsequent encounter for fracture with routine healing: Secondary | ICD-10-CM | POA: Diagnosis not present

## 2021-08-30 DIAGNOSIS — D649 Anemia, unspecified: Secondary | ICD-10-CM | POA: Diagnosis not present

## 2021-09-01 LAB — URINE CULTURE

## 2021-09-01 LAB — SPECIMEN STATUS REPORT

## 2021-09-04 ENCOUNTER — Telehealth: Payer: Self-pay | Admitting: Family Medicine

## 2021-09-04 DIAGNOSIS — I10 Essential (primary) hypertension: Secondary | ICD-10-CM | POA: Diagnosis not present

## 2021-09-04 DIAGNOSIS — D649 Anemia, unspecified: Secondary | ICD-10-CM | POA: Diagnosis not present

## 2021-09-04 DIAGNOSIS — J449 Chronic obstructive pulmonary disease, unspecified: Secondary | ICD-10-CM | POA: Diagnosis not present

## 2021-09-04 DIAGNOSIS — M80032D Age-related osteoporosis with current pathological fracture, left forearm, subsequent encounter for fracture with routine healing: Secondary | ICD-10-CM | POA: Diagnosis not present

## 2021-09-04 DIAGNOSIS — Z9181 History of falling: Secondary | ICD-10-CM | POA: Diagnosis not present

## 2021-09-04 DIAGNOSIS — N3001 Acute cystitis with hematuria: Secondary | ICD-10-CM | POA: Diagnosis not present

## 2021-09-04 DIAGNOSIS — E039 Hypothyroidism, unspecified: Secondary | ICD-10-CM | POA: Diagnosis not present

## 2021-09-04 DIAGNOSIS — E785 Hyperlipidemia, unspecified: Secondary | ICD-10-CM | POA: Diagnosis not present

## 2021-09-04 NOTE — Telephone Encounter (Signed)
That's fine

## 2021-09-04 NOTE — Telephone Encounter (Signed)
Home Health Verbal Orders - Caller/Agency: Marlowe Kays from [place] Number: [phone] Requesting OT/PT/Skilled Nursing/Social Work/Speech Therapy: OT  Frequency:  1w8

## 2021-09-04 NOTE — Telephone Encounter (Signed)
Advised 

## 2021-09-04 NOTE — Telephone Encounter (Signed)
Please advise 

## 2021-09-05 ENCOUNTER — Other Ambulatory Visit: Payer: Self-pay

## 2021-09-05 ENCOUNTER — Ambulatory Visit: Payer: Self-pay

## 2021-09-05 ENCOUNTER — Emergency Department
Admission: EM | Admit: 2021-09-05 | Discharge: 2021-09-05 | Disposition: A | Discharge status: Skilled Nursing Facility | Payer: Medicare Other | Attending: Emergency Medicine | Admitting: Emergency Medicine

## 2021-09-05 ENCOUNTER — Emergency Department: Payer: Medicare Other

## 2021-09-05 DIAGNOSIS — N3000 Acute cystitis without hematuria: Secondary | ICD-10-CM

## 2021-09-05 DIAGNOSIS — R410 Disorientation, unspecified: Secondary | ICD-10-CM | POA: Diagnosis not present

## 2021-09-05 DIAGNOSIS — Z9181 History of falling: Secondary | ICD-10-CM | POA: Diagnosis not present

## 2021-09-05 DIAGNOSIS — D72829 Elevated white blood cell count, unspecified: Secondary | ICD-10-CM | POA: Diagnosis not present

## 2021-09-05 DIAGNOSIS — R231 Pallor: Secondary | ICD-10-CM | POA: Diagnosis not present

## 2021-09-05 DIAGNOSIS — I10 Essential (primary) hypertension: Secondary | ICD-10-CM | POA: Diagnosis not present

## 2021-09-05 DIAGNOSIS — R55 Syncope and collapse: Secondary | ICD-10-CM | POA: Diagnosis not present

## 2021-09-05 DIAGNOSIS — R4020 Unspecified coma: Secondary | ICD-10-CM

## 2021-09-05 DIAGNOSIS — J449 Chronic obstructive pulmonary disease, unspecified: Secondary | ICD-10-CM | POA: Diagnosis not present

## 2021-09-05 DIAGNOSIS — N189 Chronic kidney disease, unspecified: Secondary | ICD-10-CM | POA: Diagnosis not present

## 2021-09-05 DIAGNOSIS — N3001 Acute cystitis with hematuria: Secondary | ICD-10-CM | POA: Diagnosis not present

## 2021-09-05 DIAGNOSIS — Z20822 Contact with and (suspected) exposure to covid-19: Secondary | ICD-10-CM | POA: Diagnosis not present

## 2021-09-05 DIAGNOSIS — E039 Hypothyroidism, unspecified: Secondary | ICD-10-CM | POA: Diagnosis not present

## 2021-09-05 DIAGNOSIS — M80032D Age-related osteoporosis with current pathological fracture, left forearm, subsequent encounter for fracture with routine healing: Secondary | ICD-10-CM | POA: Diagnosis not present

## 2021-09-05 DIAGNOSIS — R569 Unspecified convulsions: Secondary | ICD-10-CM | POA: Diagnosis not present

## 2021-09-05 DIAGNOSIS — N390 Urinary tract infection, site not specified: Secondary | ICD-10-CM

## 2021-09-05 DIAGNOSIS — Z743 Need for continuous supervision: Secondary | ICD-10-CM | POA: Diagnosis not present

## 2021-09-05 DIAGNOSIS — R6889 Other general symptoms and signs: Secondary | ICD-10-CM | POA: Diagnosis not present

## 2021-09-05 DIAGNOSIS — D649 Anemia, unspecified: Secondary | ICD-10-CM | POA: Diagnosis not present

## 2021-09-05 DIAGNOSIS — E785 Hyperlipidemia, unspecified: Secondary | ICD-10-CM | POA: Diagnosis not present

## 2021-09-05 LAB — COMPREHENSIVE METABOLIC PANEL
ALT: 15 U/L (ref 0–44)
AST: 17 U/L (ref 15–41)
Albumin: 3.7 g/dL (ref 3.5–5.0)
Alkaline Phosphatase: 69 U/L (ref 38–126)
Anion gap: 6 (ref 5–15)
BUN: 18 mg/dL (ref 8–23)
CO2: 26 mmol/L (ref 22–32)
Calcium: 8.7 mg/dL — ABNORMAL LOW (ref 8.9–10.3)
Chloride: 100 mmol/L (ref 98–111)
Creatinine, Ser: 0.92 mg/dL (ref 0.44–1.00)
GFR, Estimated: 59 mL/min — ABNORMAL LOW (ref >60)
Glucose, Bld: 171 mg/dL — ABNORMAL HIGH (ref 70–99)
Potassium: 3.9 mmol/L (ref 3.5–5.1)
Sodium: 132 mmol/L — ABNORMAL LOW (ref 135–145)
Total Bilirubin: 0.6 mg/dL (ref 0.3–1.2)
Total Protein: 6.6 g/dL (ref 6.5–8.1)

## 2021-09-05 LAB — URINALYSIS, COMPLETE (UACMP) WITH MICROSCOPIC
Bilirubin Urine: NEGATIVE
Glucose, UA: NEGATIVE mg/dL
Ketones, ur: 5 mg/dL — AB
Nitrite: NEGATIVE
Protein, ur: 30 mg/dL — AB
Specific Gravity, Urine: 1.016 (ref 1.005–1.030)
WBC, UA: >50 WBC/hpf — ABNORMAL HIGH (ref 0–5)
pH: 6 (ref 5.0–8.0)

## 2021-09-05 LAB — TROPONIN I (HIGH SENSITIVITY)
Troponin I (High Sensitivity): 7 ng/L (ref <18)
Troponin I (High Sensitivity): 7 ng/L (ref <18)

## 2021-09-05 LAB — RESP PANEL BY RT-PCR (FLU A&B, COVID) ARPGX2
Influenza A by PCR: NEGATIVE
Influenza B by PCR: NEGATIVE
SARS Coronavirus 2 by RT PCR: NEGATIVE

## 2021-09-05 LAB — CBC
HCT: 39.9 % (ref 36.0–46.0)
Hemoglobin: 12.7 g/dL (ref 12.0–15.0)
MCH: 28.5 pg (ref 26.0–34.0)
MCHC: 31.8 g/dL (ref 30.0–36.0)
MCV: 89.5 fL (ref 80.0–100.0)
Platelets: 356 10*3/uL (ref 150–400)
RBC: 4.46 MIL/uL (ref 3.87–5.11)
RDW: 13.9 % (ref 11.5–15.5)
WBC: 12.9 10*3/uL — ABNORMAL HIGH (ref 4.0–10.5)
nRBC: 0 % (ref 0.0–0.2)

## 2021-09-05 MED ORDER — CEFDINIR 300 MG PO CAPS: 600.0000 mg | ORAL_CAPSULE | Freq: Every day | ORAL | 0 refills | Status: AC

## 2021-09-05 MED ORDER — CEFDINIR 300 MG PO CAPS
300.0000 mg | ORAL_CAPSULE | Freq: Two times a day (BID) | ORAL | Status: DC
  Administered 2021-09-05: 300 mg via ORAL
  Filled 2021-09-05: qty 1

## 2021-09-05 MED ORDER — SODIUM CHLORIDE 0.9 % IV BOLUS: 500.0000 mL | Freq: Once | INTRAVENOUS | Status: DC

## 2021-09-05 NOTE — ED Provider Notes (Signed)
[facility] Provider Note    Event Date/Time   First MD Initiated Contact with Patient 09/05/21 1358     (approximate)  History   Chief Complaint: Seizures  HPI  Monica Singh is a 88 y.o. female with a past medical history of COPD, recurrent UTIs, CKD, presents to the emergency department after a syncopal episode for seizure.  According to the daughter she went over to visit the patient this morning at her nursing facility and found the patient still to be in bed at 730 and the patient is normally up by 7 AM which the daughter states is atypical.  Daughter states the patient seemed to be somewhat confused this morning as well.  Around lunchtime today the patient was getting her hair done at her nursing facility with when she had a loss of consciousness.  Per report she was shaking, unclear if this was syncope versus seizure.  No history of seizure disorder.  EMS states patient was weak and somewhat confused upon arrival but seem to improve on the way to the hospital.  Here the patient is awake alert he is oriented she appears well.  Daughter states the patient has a history of urinary tract infections and was just diagnosed with 1 little over a week ago however there was an incident a week ago and the patient possibly took all of her medications for the next 3 days at once and did not have any more antibiotics to take.  Daughter states when the patient gets confused is often times due to to urinary tract infection.  She called the patient's doctor this morning and he has called her in a Monica prescription for antibiotics for possible UTI although did not run a urinalysis.  Here the patient appears well, no distress.  Physical Exam   Triage Vital Signs: ED Triage Vitals [09/05/21 1337]  Enc Vitals Group     BP 138/70     Pulse Rate 87     Resp 18     Temp 97.6 F (36.4 C)     Temp src      SpO2 98 %     Weight 147 lb 11.3 oz (67 kg)     Height      Head  Circumference      Peak Flow      Pain Score 0     Pain Loc      Pain Edu?      Excl. in [place]?     Most recent vital signs: Vitals:   09/05/21 1337  BP: 138/70  Pulse: 87  Resp: 18  Temp: 97.6 F (36.4 C)  SpO2: 98%    General: Awake, no distress.  CV:  Good peripheral perfusion.  Regular rate and rhythm  Resp:  Normal effort.  Equal breath sounds bilaterally.  Abd:  No distention.  Soft, nontender.  No rebound or guarding.   ED Results / Procedures / Treatments   EKG  EKG viewed and interpreted by myself shows a normal sinus rhythm 84 bpm with a narrow QRS, left axis deviation, largely normal intervals with nonspecific but no concerning ST changes.  RADIOLOGY  Reviewed and interpreted the CT head images I do not see any large ICH on my evaluation. Radiology is read the CT scan of the head is negative for acute abnormality. CT cervical spine negative for acute abnormality   MEDICATIONS ORDERED IN ED: Medications - No data to display   IMPRESSION / MDM /  ASSESSMENT AND PLAN / ED COURSE  I reviewed the triage vital signs and the nursing notes.  Patient's presentation is most consistent with acute presentation with potential threat to life or bodily function.  Patient presents to the emergency department for syncope versus seizure episode while getting her hair done at her nursing facility.  Daughter did note this morning that the patient seemed to be more somnolent and slept in 30 minutes later than she typically does seem somewhat confused this morning which the daughter states is typical when she gets urinary tract infection.  Given no history of seizure disorder possible seizure or syncope we will obtain CT imaging of head and C-spine as precaution.  We will check labs including cardiac enzymes ensure no ACS or electrolyte or metabolic abnormality.  We will IV hydrate.  Patient did have an episode of diarrhea which the daughter states she has been having recently.  Given  the report is not clear if the patient had a seizure event versus syncope with hypoxic jerking.  Will continue to closely monitor in the emergency department while awaiting further workup.  Patient agreeable to plan.  Patient's workup so far reassuring.  COVID and flu are negative.  CBC shows slight leukocytosis otherwise normal, troponin of 7, CMP is normal.  Urinalysis is pending.  Patient continues to appear quite well in the emergency department with a reassuring workup.  Urinalysis is normal anticipate likely discharge home with PCP follow-up.  Patient care signed out to oncoming provider.  FINAL CLINICAL IMPRESSION(S) / ED DIAGNOSES   Loss of consciousness   Note:  This document was prepared using Dragon voice recognition software and may include unintentional dictation errors.   Harvest Dark, MD 09/05/21 [phone]

## 2021-09-05 NOTE — ED Notes (Signed)
Pt had large bowel mvm in bed, pt changed

## 2021-09-05 NOTE — Telephone Encounter (Signed)
Have sent prescription for cefdinir to [facility].

## 2021-09-05 NOTE — ED Triage Notes (Signed)
[facility] with [facility] for new onset sz activity. Pt was at the salon when she stopped talking, appeared to shake and go unresponsive.   Pt was postictal with ems, and has since returned to baseline. Alert and oriented on arrival. 123/74, cbg 184, NSR, 12 lead unremarkable.   Pt having abdominal cramping  PMH COPD, hypothyroid, hyperlipids.

## 2021-09-05 NOTE — Telephone Encounter (Signed)
    Chief Complaint: Daughter concerned her UTI has not "cleared up." Still having some "confusion and that is the only symptom she has when she has a UTI." Seen in office 08/28/21. Symptoms: Confusion Frequency:  Pertinent Negatives: Patient denies any other symptoms. Disposition: '[]'$ ED /'[]'$ Urgent Care (no appt availability in office) / '[]'$ Appointment(In office/virtual)/ '[]'$  [place] [facility]/ '[]'$ Home Care/ '[]'$ Refused Recommended Disposition /'[]'$  Mobile [facility]/ '[x]'$  Follow-up with PCP Additional Notes: Please advise daughter.  Answer Assessment - Initial Assessment Questions 1. MAIN SYMPTOM: "What is the main symptom you are concerned about?" (e.g., painful urination, urine frequency)     Some confusion 2. BETTER-SAME-WORSE: "Are you getting better, staying the same, or getting worse compared to how you felt at your last visit to the doctor (most recent medical visit)?"     Same 3. PAIN: "How bad is the pain?"  (e.g., Scale 1-10; mild, moderate, or severe)   - MILD (1-3): complains slightly about urination hurting   - MODERATE (4-7): interferes with normal activities     - SEVERE (8-10): excruciating, unwilling or unable to urinate because of the pain      No 4. FEVER: "Do you have a fever?" If Yes, ask: "What is it, how was it measured, and when did it start?"     No 5. OTHER SYMPTOMS: "Do you have any other symptoms?" (e.g., blood in the urine, flank pain, vaginal discharge)     No 6. DIAGNOSIS: "When was the UTI diagnosed?" "By whom?" "Was it a kidney infection, bladder infection or both?"     Yes 7. ANTIBIOTIC: "What antibiotic(s) are you taking?" "How many times per day?"     Augmentin 8. ANTIBIOTIC - START DATE: "When did you start taking the antibiotic?"     08/28/21  Protocols used: Urinary Tract Infection on Antibiotic Follow-up Call - [facility]

## 2021-09-05 NOTE — ED Notes (Signed)
RN changed pt. While changing pt, this writer noticed red substance in stool. Pt states she has a hx of Hemorrhoids. Merlyn Lot, MD notified, MD states okay to discharge.

## 2021-09-05 NOTE — Telephone Encounter (Signed)
FYI. Mrs. [name] advised that ABX was sent to [facility]. She reports that patient is currently at ED.

## 2021-09-05 NOTE — ED Notes (Signed)
Pt transport back to facility via pt's daughter.

## 2021-09-05 NOTE — ED Provider Notes (Signed)
Patient received in signout Dr. Kerman Passey pending follow-up urinalysis.  Patient reassessed.  Remains nontoxic and well-appearing.  Urinalysis consistent with UTI.  PCP is already called in [place] to her pharmacy will give her dose here.  She is tolerating p.o.  Patient feeling feel comfortable going home with outpatient follow-up.   Merlyn Lot, MD 09/05/21 [phone]

## 2021-09-10 DIAGNOSIS — J449 Chronic obstructive pulmonary disease, unspecified: Secondary | ICD-10-CM | POA: Diagnosis not present

## 2021-09-10 DIAGNOSIS — M80032D Age-related osteoporosis with current pathological fracture, left forearm, subsequent encounter for fracture with routine healing: Secondary | ICD-10-CM | POA: Diagnosis not present

## 2021-09-10 DIAGNOSIS — N3001 Acute cystitis with hematuria: Secondary | ICD-10-CM | POA: Diagnosis not present

## 2021-09-10 DIAGNOSIS — E785 Hyperlipidemia, unspecified: Secondary | ICD-10-CM | POA: Diagnosis not present

## 2021-09-10 DIAGNOSIS — D649 Anemia, unspecified: Secondary | ICD-10-CM | POA: Diagnosis not present

## 2021-09-10 DIAGNOSIS — Z9181 History of falling: Secondary | ICD-10-CM | POA: Diagnosis not present

## 2021-09-10 DIAGNOSIS — I10 Essential (primary) hypertension: Secondary | ICD-10-CM | POA: Diagnosis not present

## 2021-09-10 DIAGNOSIS — E039 Hypothyroidism, unspecified: Secondary | ICD-10-CM | POA: Diagnosis not present

## 2021-09-13 DIAGNOSIS — N3001 Acute cystitis with hematuria: Secondary | ICD-10-CM | POA: Diagnosis not present

## 2021-09-13 DIAGNOSIS — E785 Hyperlipidemia, unspecified: Secondary | ICD-10-CM | POA: Diagnosis not present

## 2021-09-13 DIAGNOSIS — J449 Chronic obstructive pulmonary disease, unspecified: Secondary | ICD-10-CM | POA: Diagnosis not present

## 2021-09-13 DIAGNOSIS — E039 Hypothyroidism, unspecified: Secondary | ICD-10-CM | POA: Diagnosis not present

## 2021-09-13 DIAGNOSIS — Z9181 History of falling: Secondary | ICD-10-CM | POA: Diagnosis not present

## 2021-09-13 DIAGNOSIS — M80032D Age-related osteoporosis with current pathological fracture, left forearm, subsequent encounter for fracture with routine healing: Secondary | ICD-10-CM | POA: Diagnosis not present

## 2021-09-13 DIAGNOSIS — D649 Anemia, unspecified: Secondary | ICD-10-CM | POA: Diagnosis not present

## 2021-09-13 DIAGNOSIS — I10 Essential (primary) hypertension: Secondary | ICD-10-CM | POA: Diagnosis not present

## 2021-09-13 NOTE — Progress Notes (Unsigned)
I,Monica Singh,acting as a Education administrator for [employer], [name].,have documented all relevant documentation on the behalf of Monica Speak, [name],as directed by  [employer], [name] while in the presence of [employer], [name].  Established patient visit   Patient: Monica Singh   DOB: [birth year]   88 y.o. Female  MRN: [id number] Visit Date: 09/14/2021  Today's healthcare provider: [name], [name]   CC; wound on left calf  Subjective    Patient presents for wound on left calf. Patient was shopping and a shoe fell from a high shelf on  July 19th. Reports it "sliced into leg." Daughter took patient to [facility] and was told she did not need stitches. Patient has been putting large bandage on it at bedtime.  Daughter wants to make sure it is healing properly.    Medications: Outpatient Medications Prior to Visit  Medication Sig   COMBIVENT RESPIMAT 20-100 MCG/ACT AERS respimat INHALE 2 PUFFS BY MOUTH INTO THE LUNGS 3 TIMES DAILY (Patient taking differently: Inhale 2 puffs into the lungs 3 (three) times daily as needed for shortness of breath or wheezing.)   CRANBERRY PO Take 1 capsule by mouth daily.   estradiol (ESTRACE) 0.1 MG/GM vaginal cream Estrogen Cream Instruction Discard applicator Apply pea sized amount to tip of finger to urethra before bed. Wash hands well after application. Use Monday, Wednesday and Friday (Patient not taking: Reported on 7/82/9562)   folic acid (FOLVITE) 1 MG tablet Take 1 tablet (1 mg total) by mouth daily. (Patient not taking: Reported on 05/18/2021)   ibuprofen (ADVIL) 200 MG tablet Take 400 mg by mouth every 6 (six) hours as needed for moderate pain.   predniSONE (STERAPRED UNI-PAK 21 TAB) 10 MG (21) TBPK tablet Take per package directions.   No facility-administered medications prior to visit.    Review of Systems  All other systems reviewed and are negative. Except See HPI  {Labs  Heme  Chem  Endocrine  Serology  Results Review  (optional):23779}   Objective    There were no vitals taken for this visit. {Show previous vital signs (optional):23777} Vitals:   09/14/21 1441 09/14/21 1450  BP: (!) 161/129 132/78  Pulse: (!) 110   Resp: 16   Temp: 98.5 F (36.9 C)   SpO2: 97%     Physical Exam Vitals reviewed.  Constitutional:      General: She is not in acute distress.    Appearance: Normal appearance. She is well-developed. She is not diaphoretic.  HENT:     Head: Normocephalic and atraumatic.     Nose: Nose normal.  Eyes:     General: No scleral icterus.    Conjunctiva/sclera: Conjunctivae normal.  Neck:     Thyroid: No thyromegaly.  Cardiovascular:     Rate and Rhythm: Normal rate and regular rhythm.     Pulses: Normal pulses.     Heart sounds: Normal heart sounds. No murmur heard. Pulmonary:     Effort: Pulmonary effort is normal. No respiratory distress.     Breath sounds: Normal breath sounds. No wheezing, rhonchi or rales.  Musculoskeletal:     Cervical back: Normal range of motion and neck supple.     Right lower leg: No edema.     Left lower leg: No edema.  Lymphadenopathy:     Cervical: No cervical adenopathy.  Skin:    General: Skin is warm and dry.     Findings: No rash.  Neurological:  Mental Status: She is alert and oriented to person, place, and time. Mental status is at baseline.  Psychiatric:        Mood and Affect: Mood normal.        Behavior: Behavior normal.        Thought Content: Thought content normal.        Judgment: Judgment normal.    Ambulates with walker   No results found for any visits on 09/14/21.  Assessment & Plan     1. Wound of left lower extremity, initial encounter X 3 weeks, healing?no oozing, discharge, odor, warmth  2. Leg swelling, bilateral  Continue current wound care. Contact us if symptoms worsen throughout weekend  FU PRN The patient was advised to call back or seek an in-person evaluation if the symptoms worsen or if the  condition fails to improve as anticipated.  I discussed the assessment and treatment plan with the patient. The patient was provided an opportunity to ask questions and all were answered. The patient agreed with the plan and demonstrated an understanding of the instructions.  The entirety of the information documented in the History of Present Illness, Review of Systems and Physical Exam were personally obtained by me. Portions of this information were initially documented by the CMA and reviewed by me for thoroughness and accuracy.  Portions of this note were created using dictation software and may contain typographical errors.   Monica Speak, [name]  [facility] [phone] (phone) [phone] (fax)  [place]

## 2021-09-14 ENCOUNTER — Encounter: Payer: Self-pay | Admitting: Physician Assistant

## 2021-09-14 ENCOUNTER — Ambulatory Visit (INDEPENDENT_AMBULATORY_CARE_PROVIDER_SITE_OTHER): Payer: Medicare Other | Admitting: Physician Assistant

## 2021-09-14 VITALS — BP 132/78 | HR 110 | Temp 98.5°F | Resp 16 | Wt 138.0 lb

## 2021-09-14 DIAGNOSIS — M7989 Other specified soft tissue disorders: Secondary | ICD-10-CM | POA: Diagnosis not present

## 2021-09-14 DIAGNOSIS — S81802A Unspecified open wound, left lower leg, initial encounter: Secondary | ICD-10-CM | POA: Diagnosis not present

## 2021-09-15 NOTE — Progress Notes (Incomplete)
I,Jermesha Sottile Robinson,acting as a Education administrator for [employer], [name].,have documented all relevant documentation on the behalf of [name], [name],as directed by  [employer], [name] while in the presence of [employer], [name].  Established patient visit   Patient: Monica Singh   DOB: 1929/01/06   88 y.o. Female  MRN: [id number] Visit Date: 09/14/2021  Today's healthcare provider: [name], [name]   CC; wound on left calf  Subjective    Patient presents for wound on left calf. Patient was shopping and a shoe fell from a high shelf on  July 19th. Reports it "sliced into leg." Daughter took patient to [facility] and was told she did not need stitches. Patient has been putting large bandage on it at bedtime.  Daughter wants to make sure it is healing properly.    Medications: Outpatient Medications Prior to Visit  Medication Sig  . COMBIVENT RESPIMAT 20-100 MCG/ACT AERS respimat INHALE 2 PUFFS BY MOUTH INTO THE LUNGS 3 TIMES DAILY (Patient taking differently: Inhale 2 puffs into the lungs 3 (three) times daily as needed for shortness of breath or wheezing.)  . CRANBERRY PO Take 1 capsule by mouth daily.  Marland Kitchen estradiol (ESTRACE) 0.1 MG/GM vaginal cream Estrogen Cream Instruction Discard applicator Apply pea sized amount to tip of finger to urethra before bed. Wash hands well after application. Use Monday, Wednesday and Friday (Patient not taking: Reported on 08/15/2021)  . folic acid (FOLVITE) 1 MG tablet Take 1 tablet (1 mg total) by mouth daily. (Patient not taking: Reported on 05/18/2021)  . ibuprofen (ADVIL) 200 MG tablet Take 400 mg by mouth every 6 (six) hours as needed for moderate pain.  . predniSONE (STERAPRED UNI-PAK 21 TAB) 10 MG (21) TBPK tablet Take per package directions.   No facility-administered medications prior to visit.    Review of Systems  All other systems reviewed and are negative. Except See HPI  {Labs  Heme  Chem  Endocrine  Serology  Results Review  (optional):23779}   Objective    There were no vitals taken for this visit. {Show previous vital signs (optional):23777} Vitals:   09/14/21 1441 09/14/21 1450  BP: (!) 161/129 132/78  Pulse: (!) 110   Resp: 16   Temp: 98.5 F (36.9 C)   SpO2: 97%     Physical Exam Vitals reviewed.  Constitutional:      General: She is not in acute distress.    Appearance: Normal appearance. She is well-developed. She is not diaphoretic.  HENT:     Head: Normocephalic and atraumatic.     Nose: Nose normal.  Eyes:     General: No scleral icterus.    Conjunctiva/sclera: Conjunctivae normal.  Neck:     Thyroid: No thyromegaly.  Cardiovascular:     Rate and Rhythm: Normal rate and regular rhythm.     Pulses: Normal pulses.     Heart sounds: Normal heart sounds. No murmur heard. Pulmonary:     Effort: Pulmonary effort is normal. No respiratory distress.     Breath sounds: Normal breath sounds. No wheezing, rhonchi or rales.  Musculoskeletal:     Cervical back: Normal range of motion and neck supple.     Right lower leg: No edema.     Left lower leg: No edema.  Lymphadenopathy:     Cervical: No cervical adenopathy.  Skin:    General: Skin is warm and dry.     Findings: No rash.  Neurological:  Mental Status: She is alert and oriented to person, place, and time. Mental status is at baseline.  Psychiatric:        Mood and Affect: Mood normal.        Behavior: Behavior normal.        Thought Content: Thought content normal.        Judgment: Judgment normal.    Ambulates with walker   No results found for any visits on 09/14/21.  Assessment & Plan     1. Wound of left lower extremity, initial encounter X 3 weeks, healing?no oozing, discharge, odor, warmth Continue current wound care. Contact us if symptoms worsen throughout weekend  2. Leg swelling, bilateral Chronic and stable    FU PRN The patient was advised to call back or seek an in-person evaluation if the symptoms  worsen or if the condition fails to improve as anticipated.  I discussed the assessment and treatment plan with the patient. The patient was provided an opportunity to ask questions and all were answered. The patient agreed with the plan and demonstrated an understanding of the instructions.  The entirety of the information documented in the History of Present Illness, Review of Systems and Physical Exam were personally obtained by me. Portions of this information were initially documented by the CMA and reviewed by me for thoroughness and accuracy.  Portions of this note were created using dictation software and may contain typographical errors.   [name], [name]  [facility] [phone] (phone) [phone] (fax)  [place]

## 2021-09-17 DIAGNOSIS — M80032D Age-related osteoporosis with current pathological fracture, left forearm, subsequent encounter for fracture with routine healing: Secondary | ICD-10-CM | POA: Diagnosis not present

## 2021-09-17 DIAGNOSIS — N3001 Acute cystitis with hematuria: Secondary | ICD-10-CM | POA: Diagnosis not present

## 2021-09-17 DIAGNOSIS — E039 Hypothyroidism, unspecified: Secondary | ICD-10-CM | POA: Diagnosis not present

## 2021-09-17 DIAGNOSIS — J449 Chronic obstructive pulmonary disease, unspecified: Secondary | ICD-10-CM | POA: Diagnosis not present

## 2021-09-17 DIAGNOSIS — E785 Hyperlipidemia, unspecified: Secondary | ICD-10-CM | POA: Diagnosis not present

## 2021-09-17 DIAGNOSIS — I10 Essential (primary) hypertension: Secondary | ICD-10-CM | POA: Diagnosis not present

## 2021-09-17 DIAGNOSIS — D649 Anemia, unspecified: Secondary | ICD-10-CM | POA: Diagnosis not present

## 2021-09-17 DIAGNOSIS — Z9181 History of falling: Secondary | ICD-10-CM | POA: Diagnosis not present

## 2021-09-18 ENCOUNTER — Telehealth: Payer: Self-pay | Admitting: Family Medicine

## 2021-09-18 DIAGNOSIS — I10 Essential (primary) hypertension: Secondary | ICD-10-CM | POA: Diagnosis not present

## 2021-09-18 DIAGNOSIS — J449 Chronic obstructive pulmonary disease, unspecified: Secondary | ICD-10-CM | POA: Diagnosis not present

## 2021-09-18 DIAGNOSIS — E785 Hyperlipidemia, unspecified: Secondary | ICD-10-CM | POA: Diagnosis not present

## 2021-09-18 DIAGNOSIS — N3001 Acute cystitis with hematuria: Secondary | ICD-10-CM | POA: Diagnosis not present

## 2021-09-18 DIAGNOSIS — Z9181 History of falling: Secondary | ICD-10-CM | POA: Diagnosis not present

## 2021-09-18 DIAGNOSIS — D649 Anemia, unspecified: Secondary | ICD-10-CM | POA: Diagnosis not present

## 2021-09-18 DIAGNOSIS — M80032D Age-related osteoporosis with current pathological fracture, left forearm, subsequent encounter for fracture with routine healing: Secondary | ICD-10-CM | POA: Diagnosis not present

## 2021-09-18 DIAGNOSIS — E039 Hypothyroidism, unspecified: Secondary | ICD-10-CM | POA: Diagnosis not present

## 2021-09-18 NOTE — Telephone Encounter (Signed)
Home Health Verbal Orders - Caller/Agency: Ennis Forts with [facility] Number: [phone] Requesting Skilled Nursing for open wound on left leg Frequency: Assessment to check wound

## 2021-09-18 NOTE — Telephone Encounter (Signed)
That's fine

## 2021-09-19 NOTE — Telephone Encounter (Signed)
LM with Monica Singh on her VM for verbal order.  [phone]

## 2021-09-21 ENCOUNTER — Ambulatory Visit: Payer: Self-pay | Admitting: *Deleted

## 2021-09-21 ENCOUNTER — Telehealth: Payer: Self-pay | Admitting: Family Medicine

## 2021-09-21 DIAGNOSIS — E785 Hyperlipidemia, unspecified: Secondary | ICD-10-CM | POA: Diagnosis not present

## 2021-09-21 DIAGNOSIS — E039 Hypothyroidism, unspecified: Secondary | ICD-10-CM | POA: Diagnosis not present

## 2021-09-21 DIAGNOSIS — M80032D Age-related osteoporosis with current pathological fracture, left forearm, subsequent encounter for fracture with routine healing: Secondary | ICD-10-CM | POA: Diagnosis not present

## 2021-09-21 DIAGNOSIS — I10 Essential (primary) hypertension: Secondary | ICD-10-CM | POA: Diagnosis not present

## 2021-09-21 DIAGNOSIS — Z9181 History of falling: Secondary | ICD-10-CM | POA: Diagnosis not present

## 2021-09-21 DIAGNOSIS — J449 Chronic obstructive pulmonary disease, unspecified: Secondary | ICD-10-CM | POA: Diagnosis not present

## 2021-09-21 DIAGNOSIS — N3001 Acute cystitis with hematuria: Secondary | ICD-10-CM | POA: Diagnosis not present

## 2021-09-21 DIAGNOSIS — S81802A Unspecified open wound, left lower leg, initial encounter: Secondary | ICD-10-CM

## 2021-09-21 DIAGNOSIS — D649 Anemia, unspecified: Secondary | ICD-10-CM | POA: Diagnosis not present

## 2021-09-21 MED ORDER — DOXYCYCLINE HYCLATE 100 MG PO TABS: 100.0000 mg | ORAL_TABLET | Freq: Two times a day (BID) | ORAL | 0 refills | Status: DC

## 2021-09-21 NOTE — Telephone Encounter (Signed)
That's fine. Have sent for [facility]

## 2021-09-21 NOTE — Telephone Encounter (Signed)
Summary: wound on lt calf   Pt was seen on 8-18 for wound on lt calf   Daughter states home nurse informed her after today's visit (8-25) the wound looks like cellulitis and pt needs an antibiotic   Please fu w/ daughter       Hulen Skains patient's daughter on [place], [name] and gave message from Dr. Caryn Section from today at 5:10 pm , have [facility]. Patient's daughter very grateful.       Reason for Disposition  Health Information question, no triage required and triager able to answer question  Answer Assessment - Initial Assessment Questions 1. REASON FOR CALL or QUESTION: "What is your reason for calling today?" or "How can I best help you?" or "What question do you have that I can help answer?"     Patient's daughter would like to know if patient can get antibiotic for left calf wound.  Protocols used: Information Only Call - No Triage-A-AH

## 2021-09-21 NOTE — Telephone Encounter (Signed)
Home Health Verbal Orders - Caller/Agency: [facility] Number: [phone] Requesting Skilled Nursing for management of wound Frequency: Today, 1 wk 1, 2 wk 2, 1 wk 6 and 2 prns  Wound is under the scab but needs antibiotics for her left lower leg. If the office needs a picture they would just need to call [place]  Please assist further and if you want to proceed call her or the office

## 2021-09-24 NOTE — Telephone Encounter (Signed)
Willis Modena advised of approved verbal orders. Patient started abx Friday night. Daughter reports wound looks better today.

## 2021-09-25 DIAGNOSIS — S52502D Unspecified fracture of the lower end of left radius, subsequent encounter for closed fracture with routine healing: Secondary | ICD-10-CM | POA: Diagnosis not present

## 2021-09-26 DIAGNOSIS — I10 Essential (primary) hypertension: Secondary | ICD-10-CM | POA: Diagnosis not present

## 2021-09-26 DIAGNOSIS — D649 Anemia, unspecified: Secondary | ICD-10-CM | POA: Diagnosis not present

## 2021-09-26 DIAGNOSIS — Z9181 History of falling: Secondary | ICD-10-CM | POA: Diagnosis not present

## 2021-09-26 DIAGNOSIS — E785 Hyperlipidemia, unspecified: Secondary | ICD-10-CM | POA: Diagnosis not present

## 2021-09-26 DIAGNOSIS — N3001 Acute cystitis with hematuria: Secondary | ICD-10-CM | POA: Diagnosis not present

## 2021-09-26 DIAGNOSIS — E039 Hypothyroidism, unspecified: Secondary | ICD-10-CM | POA: Diagnosis not present

## 2021-09-26 DIAGNOSIS — M80032D Age-related osteoporosis with current pathological fracture, left forearm, subsequent encounter for fracture with routine healing: Secondary | ICD-10-CM | POA: Diagnosis not present

## 2021-09-26 DIAGNOSIS — J449 Chronic obstructive pulmonary disease, unspecified: Secondary | ICD-10-CM | POA: Diagnosis not present

## 2021-09-28 ENCOUNTER — Encounter: Payer: Self-pay | Admitting: Family Medicine

## 2021-09-28 ENCOUNTER — Ambulatory Visit: Payer: Self-pay

## 2021-09-28 ENCOUNTER — Ambulatory Visit (INDEPENDENT_AMBULATORY_CARE_PROVIDER_SITE_OTHER): Payer: Medicare Other | Admitting: Family Medicine

## 2021-09-28 VITALS — BP 119/70 | HR 99 | Resp 16 | Wt 145.0 lb

## 2021-09-28 DIAGNOSIS — N390 Urinary tract infection, site not specified: Secondary | ICD-10-CM | POA: Diagnosis not present

## 2021-09-28 DIAGNOSIS — R300 Dysuria: Secondary | ICD-10-CM | POA: Insufficient documentation

## 2021-09-28 LAB — POCT URINALYSIS DIPSTICK
Bilirubin, UA: NEGATIVE
Glucose, UA: NEGATIVE
Ketones, UA: NEGATIVE
Nitrite, UA: POSITIVE
Odor: POSITIVE
Protein, UA: NEGATIVE
Spec Grav, UA: 1.01 (ref 1.010–1.025)
Urobilinogen, UA: 0.2 E.U./dL
pH, UA: 6 (ref 5.0–8.0)

## 2021-09-28 MED ORDER — AMOXICILLIN-POT CLAVULANATE 875-125 MG PO TABS: 1.0000 | ORAL_TABLET | Freq: Two times a day (BID) | ORAL | 0 refills | Status: DC

## 2021-09-28 NOTE — Telephone Encounter (Signed)
  Chief Complaint: UTI Symptoms: UTI, confusion, behaviors Frequency: at least 3 days Pertinent Negatives: Patient denies urinary pain or burning  Disposition: '[]'$ ED /'[]'$ Urgent Care (no appt availability in office) / '[x]'$ Appointment(In office/virtual)/ '[]'$  [facility]/ '[]'$ Home Care/ '[]'$ Refused Recommended Disposition /'[]'$ [facility]/ '[]'$  Follow-up with PCP Additional Notes: spoke with [name], pt's daughter. She states pt was very off this morning and other daughter noticed possibly last Friday. Wanting to have pt checked out for UTI since this is recurrent issue. Scheduled appt for today at 1400 with Dr. Quentin Cornwall.   Reason for Disposition  All other urine symptoms  Answer Assessment - Initial Assessment Questions 1. SYMPTOM: "What's the main symptom you're concerned about?" (e.g., frequency, incontinence)     UTI 2. ONSET: "When did the  sx  start?"     Possibly last Friday  4. CAUSE: "What do you think is causing the symptoms?"     UTI 5. OTHER SYMPTOMS: "Do you have any other symptoms?" (e.g., blood in urine, fever, flank pain, pain with urination)     Confusion, behaviors  Protocols used: Urinary Symptoms-A-AH

## 2021-09-28 NOTE — Assessment & Plan Note (Signed)
Will order urine culture and microscopy Will treat for UTI as patient urine dipstick analysis showed +nitrites and leukocytes  Will prescribe Augmentin based on prior urine culture sensitivities Reviewed ED precautions with patient's daughter who voiced understanding

## 2021-09-28 NOTE — Progress Notes (Signed)
Established patient visit  I,Joseline E Rosas,acting as a scribe for [employer], MD.,have documented all relevant documentation on the behalf of Eulis Foster, MD,as directed by  Eulis Foster, MD while in the presence of Eulis Foster, MD.   Patient: Monica Singh   DOB: [birth year]   88 y.o. Female  MRN: [id number] Visit Date: 09/28/2021  Today's healthcare provider: Eulis Foster, MD   Chief Complaint  Patient presents with   Dysuria   Subjective    HPI  Patient here with her daughter [name]. She is concern that patient may have a UTI, reports that she has been forgetful for the past few days. Reports no fever, no hematuria.  Medications: Outpatient Medications Prior to Visit  Medication Sig   COMBIVENT RESPIMAT 20-100 MCG/ACT AERS respimat INHALE 2 PUFFS BY MOUTH INTO THE LUNGS 3 TIMES DAILY (Patient taking differently: Inhale 2 puffs into the lungs 3 (three) times daily as needed for shortness of breath or wheezing.)   [DISCONTINUED] doxycycline (VIBRA-TABS) 100 MG tablet Take 1 tablet (100 mg total) by mouth 2 (two) times daily for 10 days.   No facility-administered medications prior to visit.    Review of Systems    Objective    BP 119/70 (BP Location: Left Arm, Patient Position: Sitting, Cuff Size: Normal)   Pulse 99   Resp 16   Wt 145 lb (65.8 kg)   BMI 26.52 kg/m   Physical Exam Constitutional:      General: She is not in acute distress.    Appearance: Normal appearance. She is not ill-appearing, toxic-appearing or diaphoretic.  Pulmonary:     Effort: Pulmonary effort is normal.  Abdominal:     General: Bowel sounds are normal. There is no distension.     Palpations: Abdomen is soft.     Tenderness: There is no abdominal tenderness. There is no right CVA tenderness, left CVA tenderness or guarding.  Musculoskeletal:     Cervical back: Normal range of motion.  Skin:    General: Skin is  warm.  Neurological:     Mental Status: She is alert. Mental status is at baseline.       Results for orders placed or performed in visit on 09/28/21  POCT urinalysis dipstick  Result Value Ref Range   Color, UA dark yellow    Clarity, UA cloudy    Glucose, UA Negative Negative   Bilirubin, UA Negative    Ketones, UA Negative    Spec Grav, UA 1.010 1.010 - 1.025   Blood, UA Small    pH, UA 6.0 5.0 - 8.0   Protein, UA Negative Negative   Urobilinogen, UA 0.2 0.2 or 1.0 E.U./dL   Nitrite, UA Positive    Leukocytes, UA Large (3+) (A) Negative   Appearance     Odor Positive     Assessment & Plan     Problem List Items Addressed This Visit       Genitourinary   Frequent UTI - Primary    Will order urine culture and microscopy Will treat for UTI as patient urine dipstick analysis showed +nitrites and leukocytes  Will prescribe Augmentin based on prior urine culture sensitivities Reviewed ED precautions with patient's daughter who voiced understanding       Relevant Orders   POCT urinalysis dipstick (Completed)   Urine Culture   Urine Microscopic   The entirety of the information documented in the History of Present Illness, Review of Systems and Physical Exam  were personally obtained by me. Portions of this information were initially documented by the CMA and reviewed by me for thoroughness and accuracy.     Eulis Foster, MD  [name] [phone] (phone) [phone] (fax)  [place]

## 2021-09-29 LAB — URINALYSIS, MICROSCOPIC ONLY
Casts: NONE SEEN /lpf
WBC, UA: >30 /hpf — AB (ref 0–5)

## 2021-10-02 ENCOUNTER — Telehealth: Payer: Self-pay

## 2021-10-02 LAB — URINE CULTURE

## 2021-10-02 NOTE — Telephone Encounter (Signed)
Form was faxed to facility on 08/29/21 Left message with this information for [facility].

## 2021-10-02 NOTE — Telephone Encounter (Signed)
Copied from [place] [id number]. Topic: General - Other >> Sep 28, 2021  4:57 PM Everette C wrote: Reason for CRM: Kelsea with [facility] called to request an updated FL2 including the patient's active medication list   Please fax to [phone] Attention Kelsea   Please contact further if needed

## 2021-10-03 DIAGNOSIS — N3001 Acute cystitis with hematuria: Secondary | ICD-10-CM | POA: Diagnosis not present

## 2021-10-03 DIAGNOSIS — I10 Essential (primary) hypertension: Secondary | ICD-10-CM | POA: Diagnosis not present

## 2021-10-03 DIAGNOSIS — J449 Chronic obstructive pulmonary disease, unspecified: Secondary | ICD-10-CM | POA: Diagnosis not present

## 2021-10-03 DIAGNOSIS — M80032D Age-related osteoporosis with current pathological fracture, left forearm, subsequent encounter for fracture with routine healing: Secondary | ICD-10-CM | POA: Diagnosis not present

## 2021-10-03 DIAGNOSIS — E785 Hyperlipidemia, unspecified: Secondary | ICD-10-CM | POA: Diagnosis not present

## 2021-10-03 DIAGNOSIS — D649 Anemia, unspecified: Secondary | ICD-10-CM | POA: Diagnosis not present

## 2021-10-03 DIAGNOSIS — E039 Hypothyroidism, unspecified: Secondary | ICD-10-CM | POA: Diagnosis not present

## 2021-10-03 DIAGNOSIS — Z9181 History of falling: Secondary | ICD-10-CM | POA: Diagnosis not present

## 2021-10-04 DIAGNOSIS — D649 Anemia, unspecified: Secondary | ICD-10-CM | POA: Diagnosis not present

## 2021-10-04 DIAGNOSIS — I10 Essential (primary) hypertension: Secondary | ICD-10-CM | POA: Diagnosis not present

## 2021-10-04 DIAGNOSIS — Z9181 History of falling: Secondary | ICD-10-CM | POA: Diagnosis not present

## 2021-10-04 DIAGNOSIS — E785 Hyperlipidemia, unspecified: Secondary | ICD-10-CM | POA: Diagnosis not present

## 2021-10-04 DIAGNOSIS — E039 Hypothyroidism, unspecified: Secondary | ICD-10-CM | POA: Diagnosis not present

## 2021-10-04 DIAGNOSIS — M80032D Age-related osteoporosis with current pathological fracture, left forearm, subsequent encounter for fracture with routine healing: Secondary | ICD-10-CM | POA: Diagnosis not present

## 2021-10-04 DIAGNOSIS — J449 Chronic obstructive pulmonary disease, unspecified: Secondary | ICD-10-CM | POA: Diagnosis not present

## 2021-10-04 DIAGNOSIS — N3001 Acute cystitis with hematuria: Secondary | ICD-10-CM | POA: Diagnosis not present

## 2021-10-05 ENCOUNTER — Telehealth: Payer: Self-pay | Admitting: Family Medicine

## 2021-10-05 NOTE — Telephone Encounter (Signed)
Verbal okay was given. 

## 2021-10-05 NOTE — Telephone Encounter (Signed)
That's fine

## 2021-10-05 NOTE — Telephone Encounter (Signed)
Home Health Verbal Orders - Caller/Agency: Marlowe Kays with [facility] Number: [phone] Requesting OT Frequency: 1 wk 8

## 2021-10-09 ENCOUNTER — Telehealth: Payer: Self-pay

## 2021-10-09 NOTE — Telephone Encounter (Signed)
Copied from [place] [id number]. Topic: General - Other >> Oct 09, 2021  9:52 AM Cyndi Bender wrote: Reason for CRM: Huston Foley with [facility] requests an updated FL2 form with all current medications to be faxed to [phone]

## 2021-10-10 NOTE — Telephone Encounter (Signed)
Per chart, FL2 form was faxed on 08/29/2021.  Monica Singh with home [facility] is requesting an updated FL2 form with all current medications. The FL2 form that was faxed on 08/29/2021 lists several medications that are no longer on her current medication list here in the chart. Patient chart only lists:  amoxicillin-clavulanate (AUGMENTIN) 875-125 MG tablet  COMBIVENT RESPIMAT 20-100 MCG/ACT AERS respimat  Please advise wether the medications that were listed on the FL2 form from 08/29/2021 are correct. Or have they been discontinued?

## 2021-10-11 DIAGNOSIS — Z9181 History of falling: Secondary | ICD-10-CM | POA: Diagnosis not present

## 2021-10-11 DIAGNOSIS — N3001 Acute cystitis with hematuria: Secondary | ICD-10-CM | POA: Diagnosis not present

## 2021-10-11 DIAGNOSIS — I10 Essential (primary) hypertension: Secondary | ICD-10-CM | POA: Diagnosis not present

## 2021-10-11 DIAGNOSIS — D649 Anemia, unspecified: Secondary | ICD-10-CM | POA: Diagnosis not present

## 2021-10-11 DIAGNOSIS — E785 Hyperlipidemia, unspecified: Secondary | ICD-10-CM | POA: Diagnosis not present

## 2021-10-11 DIAGNOSIS — E039 Hypothyroidism, unspecified: Secondary | ICD-10-CM | POA: Diagnosis not present

## 2021-10-11 DIAGNOSIS — J449 Chronic obstructive pulmonary disease, unspecified: Secondary | ICD-10-CM | POA: Diagnosis not present

## 2021-10-11 DIAGNOSIS — M80032D Age-related osteoporosis with current pathological fracture, left forearm, subsequent encounter for fracture with routine healing: Secondary | ICD-10-CM | POA: Diagnosis not present

## 2021-10-16 DIAGNOSIS — I10 Essential (primary) hypertension: Secondary | ICD-10-CM | POA: Diagnosis not present

## 2021-10-16 DIAGNOSIS — Z9181 History of falling: Secondary | ICD-10-CM | POA: Diagnosis not present

## 2021-10-16 DIAGNOSIS — E785 Hyperlipidemia, unspecified: Secondary | ICD-10-CM | POA: Diagnosis not present

## 2021-10-16 DIAGNOSIS — D649 Anemia, unspecified: Secondary | ICD-10-CM | POA: Diagnosis not present

## 2021-10-16 DIAGNOSIS — E039 Hypothyroidism, unspecified: Secondary | ICD-10-CM | POA: Diagnosis not present

## 2021-10-16 DIAGNOSIS — N3001 Acute cystitis with hematuria: Secondary | ICD-10-CM | POA: Diagnosis not present

## 2021-10-16 DIAGNOSIS — M80032D Age-related osteoporosis with current pathological fracture, left forearm, subsequent encounter for fracture with routine healing: Secondary | ICD-10-CM | POA: Diagnosis not present

## 2021-10-16 DIAGNOSIS — J449 Chronic obstructive pulmonary disease, unspecified: Secondary | ICD-10-CM | POA: Diagnosis not present

## 2021-10-18 ENCOUNTER — Telehealth: Payer: Self-pay | Admitting: Family Medicine

## 2021-10-18 DIAGNOSIS — D649 Anemia, unspecified: Secondary | ICD-10-CM | POA: Diagnosis not present

## 2021-10-18 DIAGNOSIS — E039 Hypothyroidism, unspecified: Secondary | ICD-10-CM | POA: Diagnosis not present

## 2021-10-18 DIAGNOSIS — I10 Essential (primary) hypertension: Secondary | ICD-10-CM | POA: Diagnosis not present

## 2021-10-18 DIAGNOSIS — M80032D Age-related osteoporosis with current pathological fracture, left forearm, subsequent encounter for fracture with routine healing: Secondary | ICD-10-CM | POA: Diagnosis not present

## 2021-10-18 DIAGNOSIS — Z9181 History of falling: Secondary | ICD-10-CM | POA: Diagnosis not present

## 2021-10-18 DIAGNOSIS — E785 Hyperlipidemia, unspecified: Secondary | ICD-10-CM | POA: Diagnosis not present

## 2021-10-18 DIAGNOSIS — J449 Chronic obstructive pulmonary disease, unspecified: Secondary | ICD-10-CM | POA: Diagnosis not present

## 2021-10-18 DIAGNOSIS — N3001 Acute cystitis with hematuria: Secondary | ICD-10-CM | POA: Diagnosis not present

## 2021-10-18 NOTE — Telephone Encounter (Signed)
That's fine

## 2021-10-18 NOTE — Telephone Encounter (Signed)
Home Health Verbal Orders - Caller/Agency: Colletta [place] from [facility] says faxed request over on 09/06   Callback Number: [phone] Requesting Skilled Nursing Frequency: ?

## 2021-10-23 DIAGNOSIS — D649 Anemia, unspecified: Secondary | ICD-10-CM | POA: Diagnosis not present

## 2021-10-23 DIAGNOSIS — Z9181 History of falling: Secondary | ICD-10-CM | POA: Diagnosis not present

## 2021-10-23 DIAGNOSIS — J449 Chronic obstructive pulmonary disease, unspecified: Secondary | ICD-10-CM | POA: Diagnosis not present

## 2021-10-23 DIAGNOSIS — E039 Hypothyroidism, unspecified: Secondary | ICD-10-CM | POA: Diagnosis not present

## 2021-10-23 DIAGNOSIS — N3001 Acute cystitis with hematuria: Secondary | ICD-10-CM | POA: Diagnosis not present

## 2021-10-23 DIAGNOSIS — I10 Essential (primary) hypertension: Secondary | ICD-10-CM | POA: Diagnosis not present

## 2021-10-23 DIAGNOSIS — E785 Hyperlipidemia, unspecified: Secondary | ICD-10-CM | POA: Diagnosis not present

## 2021-10-23 DIAGNOSIS — M80032D Age-related osteoporosis with current pathological fracture, left forearm, subsequent encounter for fracture with routine healing: Secondary | ICD-10-CM | POA: Diagnosis not present

## 2021-10-26 DIAGNOSIS — E785 Hyperlipidemia, unspecified: Secondary | ICD-10-CM | POA: Diagnosis not present

## 2021-10-26 DIAGNOSIS — M80032D Age-related osteoporosis with current pathological fracture, left forearm, subsequent encounter for fracture with routine healing: Secondary | ICD-10-CM | POA: Diagnosis not present

## 2021-10-26 DIAGNOSIS — N3001 Acute cystitis with hematuria: Secondary | ICD-10-CM | POA: Diagnosis not present

## 2021-10-26 DIAGNOSIS — D649 Anemia, unspecified: Secondary | ICD-10-CM | POA: Diagnosis not present

## 2021-10-26 DIAGNOSIS — Z9181 History of falling: Secondary | ICD-10-CM | POA: Diagnosis not present

## 2021-10-26 DIAGNOSIS — E039 Hypothyroidism, unspecified: Secondary | ICD-10-CM | POA: Diagnosis not present

## 2021-10-26 DIAGNOSIS — I10 Essential (primary) hypertension: Secondary | ICD-10-CM | POA: Diagnosis not present

## 2021-10-26 DIAGNOSIS — J449 Chronic obstructive pulmonary disease, unspecified: Secondary | ICD-10-CM | POA: Diagnosis not present

## 2021-10-30 DIAGNOSIS — E039 Hypothyroidism, unspecified: Secondary | ICD-10-CM | POA: Diagnosis not present

## 2021-10-30 DIAGNOSIS — M80032D Age-related osteoporosis with current pathological fracture, left forearm, subsequent encounter for fracture with routine healing: Secondary | ICD-10-CM | POA: Diagnosis not present

## 2021-10-30 DIAGNOSIS — D649 Anemia, unspecified: Secondary | ICD-10-CM | POA: Diagnosis not present

## 2021-10-30 DIAGNOSIS — I10 Essential (primary) hypertension: Secondary | ICD-10-CM | POA: Diagnosis not present

## 2021-10-30 DIAGNOSIS — J449 Chronic obstructive pulmonary disease, unspecified: Secondary | ICD-10-CM | POA: Diagnosis not present

## 2021-10-30 DIAGNOSIS — E785 Hyperlipidemia, unspecified: Secondary | ICD-10-CM | POA: Diagnosis not present

## 2021-10-30 DIAGNOSIS — Z9181 History of falling: Secondary | ICD-10-CM | POA: Diagnosis not present

## 2021-10-30 DIAGNOSIS — N3001 Acute cystitis with hematuria: Secondary | ICD-10-CM | POA: Diagnosis not present

## 2021-11-01 DIAGNOSIS — M80032D Age-related osteoporosis with current pathological fracture, left forearm, subsequent encounter for fracture with routine healing: Secondary | ICD-10-CM | POA: Diagnosis not present

## 2021-11-01 DIAGNOSIS — J449 Chronic obstructive pulmonary disease, unspecified: Secondary | ICD-10-CM | POA: Diagnosis not present

## 2021-11-01 DIAGNOSIS — E039 Hypothyroidism, unspecified: Secondary | ICD-10-CM | POA: Diagnosis not present

## 2021-11-01 DIAGNOSIS — N3001 Acute cystitis with hematuria: Secondary | ICD-10-CM | POA: Diagnosis not present

## 2021-11-01 DIAGNOSIS — I10 Essential (primary) hypertension: Secondary | ICD-10-CM | POA: Diagnosis not present

## 2021-11-01 DIAGNOSIS — D649 Anemia, unspecified: Secondary | ICD-10-CM | POA: Diagnosis not present

## 2021-11-01 DIAGNOSIS — E785 Hyperlipidemia, unspecified: Secondary | ICD-10-CM | POA: Diagnosis not present

## 2021-11-01 DIAGNOSIS — Z9181 History of falling: Secondary | ICD-10-CM | POA: Diagnosis not present

## 2021-11-05 DIAGNOSIS — D649 Anemia, unspecified: Secondary | ICD-10-CM | POA: Diagnosis not present

## 2021-11-05 DIAGNOSIS — E785 Hyperlipidemia, unspecified: Secondary | ICD-10-CM | POA: Diagnosis not present

## 2021-11-05 DIAGNOSIS — I10 Essential (primary) hypertension: Secondary | ICD-10-CM | POA: Diagnosis not present

## 2021-11-05 DIAGNOSIS — E039 Hypothyroidism, unspecified: Secondary | ICD-10-CM | POA: Diagnosis not present

## 2021-11-05 DIAGNOSIS — N3001 Acute cystitis with hematuria: Secondary | ICD-10-CM | POA: Diagnosis not present

## 2021-11-05 DIAGNOSIS — J449 Chronic obstructive pulmonary disease, unspecified: Secondary | ICD-10-CM | POA: Diagnosis not present

## 2021-11-05 DIAGNOSIS — M80032D Age-related osteoporosis with current pathological fracture, left forearm, subsequent encounter for fracture with routine healing: Secondary | ICD-10-CM | POA: Diagnosis not present

## 2021-11-05 DIAGNOSIS — Z9181 History of falling: Secondary | ICD-10-CM | POA: Diagnosis not present

## 2021-11-08 ENCOUNTER — Encounter: Payer: Self-pay | Admitting: Urology

## 2021-11-08 ENCOUNTER — Ambulatory Visit: Payer: Medicare Other | Admitting: Urology

## 2021-11-08 ENCOUNTER — Ambulatory Visit: Payer: Self-pay

## 2021-11-08 VITALS — BP 146/77 | HR 102

## 2021-11-08 DIAGNOSIS — R82998 Other abnormal findings in urine: Secondary | ICD-10-CM | POA: Diagnosis not present

## 2021-11-08 DIAGNOSIS — R410 Disorientation, unspecified: Secondary | ICD-10-CM

## 2021-11-08 DIAGNOSIS — N390 Urinary tract infection, site not specified: Secondary | ICD-10-CM

## 2021-11-08 LAB — URINALYSIS, COMPLETE
Bilirubin, UA: NEGATIVE
Glucose, UA: NEGATIVE
Nitrite, UA: POSITIVE — AB
Specific Gravity, UA: 1.025 (ref 1.005–1.030)
Urobilinogen, Ur: 1 mg/dL (ref 0.2–1.0)
pH, UA: 6 (ref 5.0–7.5)

## 2021-11-08 LAB — MICROSCOPIC EXAMINATION: WBC, UA: >30 /hpf — AB (ref 0–5)

## 2021-11-08 MED ORDER — AMOXICILLIN-POT CLAVULANATE 875-125 MG PO TABS: 1.0000 | ORAL_TABLET | Freq: Two times a day (BID) | ORAL | 0 refills | Status: DC

## 2021-11-08 NOTE — Telephone Encounter (Signed)
Ok to order UA and urine culture. They can bring it to the office today.  If she worsens, recommend evaluation.

## 2021-11-08 NOTE — Telephone Encounter (Signed)
NA. LMTCB 

## 2021-11-08 NOTE — Progress Notes (Signed)
11/08/2021 5:23 PM   [place] 1928-07-11 [id number]  Referring provider: Birdie Sons, Monica [place] [facility] [place],  [place] [address]  Urological history: 1. Left UPJ obstruction -creatinine 0.91 in 04/2020  -managed with an indwelling Optima Bard ureteral stent exchanged yearly -last exchange 05/2021 -Diagnostic ureteroscopy in January 2020 did not show any concerning lesions and cytology was negative  2. rUTI's -contributing factors of age, indwelling stent and vaginal atrophy -documented urine cultures over the last year  September 28, 2021 E. Coli  August 28, 2021 E. Coli  July 03, 2021 E. Coli  May 15, 2021 E. coli and Klebsiella pneumoniae  March 07, 2021 Klebsiella pneumonia and Citrobacter amalonaticus  January 25, 2021 Klebsiella aerogenes  November 16, 2020 E. Coli   Chief Complaint  Patient presents with   Recurrent UTI      HPI: Monica Singh is a 88 y.o. female who presents for recheck on UTI with her daughter, [name].      Her daughter called today stating that she noticed her mother had been more confused for the last few days and she is sleeping in which is out of character for her as she usually is a early morning riser and very active.  Her daughter states that this is typically the signs of her having a UTI.  Patient denies any modifying or aggravating factors.  Patient denies any gross hematuria, dysuria or suprapubic/flank pain.  Patient denies any fevers, chills, nausea or vomiting.    UA nitrate positive, greater than 30 WBCs, 11-30 RBCs and many bacteria  PMH: Past Medical History:  Diagnosis Date   Actinic keratosis    Adnexal mass 06/03/2014   since 2009   Arthritis    Chronic kidney disease    Complication of anesthesia    COPD (chronic obstructive pulmonary disease) (HCC)    Diffuse large cell lymphoma in remission ([place]) 01/2007   NON-HODGKINS-stage 3, cd 20 positive; status post 6 cycles of R-CHOP    Dyspnea    with exertion   Herpes zoster without complication 87/56/4332   History of hiatal hernia    HOH (hard of hearing)    wears hearing aides   Mitral regurgitation    Non Hodgkin's lymphoma ([place])    Personal history of chemotherapy    PONV (postoperative nausea and vomiting)    in January lasted about 4 hours   Recurrent UTI    Squamous cell carcinoma of skin 11/09/2008   Right post. lat. elbow. SCCis arising in [place]. Excised 01/04/2009, margins free.    Squamous cell carcinoma of skin 07/26/2020   right hand   Syncope     Surgical History: Past Surgical History:  Procedure Laterality Date   ABDOMINAL SURGERY  2009   abdominal mass+ NH lymphoma,   BACK SURGERY  01/2017   fusion. metal plate in neck at back   [place]   right eye and left ey   cervical neck fusion  1995   CYSTOSCOPY W/ RETROGRADES Left 01/30/2018   Procedure: CYSTOSCOPY WITH RETROGRADE PYELOGRAM;  Surgeon: Billey Co, MD;  Location: [facility];  Service: Urology;  Laterality: Left;   CYSTOSCOPY W/ RETROGRADES Left 08/05/2018   Procedure: CYSTOSCOPY WITH RETROGRADE PYELOGRAM;  Surgeon: Billey Co, MD;  Location: [facility];  Service: Urology;  Laterality: Left;   CYSTOSCOPY W/ RETROGRADES Left 06/01/2021   Procedure: CYSTOSCOPY WITH RETROGRADE PYELOGRAM;  Surgeon: Billey Co, MD;  Location:  [facility];  Service: Urology;  Laterality: Left;   CYSTOSCOPY W/ URETERAL STENT PLACEMENT Left 08/05/2018   Procedure: CYSTOSCOPY WITH STENT Exchange;  Surgeon: Billey Co, MD;  Location: [facility];  Service: Urology;  Laterality: Left;   CYSTOSCOPY W/ URETERAL STENT PLACEMENT Left 05/21/2019   Procedure: CYSTOSCOPY WITH STENT REPLACEMENT;  Surgeon: Billey Co, MD;  Location: [facility];  Service: Urology;  Laterality: Left;   CYSTOSCOPY W/ URETERAL STENT PLACEMENT Left 05/05/2020   Procedure: CYSTOSCOPY WITH RETROGRADE PYELOGRAM/URETERAL STENT EXCHANGE;  Surgeon: Billey Co, MD;   Location: [facility];  Service: Urology;  Laterality: Left;   CYSTOSCOPY W/ URETERAL STENT PLACEMENT Left 06/01/2021   Procedure: CYSTOSCOPY WITH STENT EXCHANGE;  Surgeon: Billey Co, MD;  Location: [facility];  Service: Urology;  Laterality: Left;   CYSTOSCOPY WITH BIOPSY Left 02/27/2018   Procedure: CYSTOSCOPY WITH BIOPSY;  Surgeon: Billey Co, MD;  Location: [facility];  Service: Urology;  Laterality: Left;   CYSTOSCOPY WITH STENT PLACEMENT Left 01/30/2018   Procedure: CYSTOSCOPY WITH STENT PLACEMENT;  Surgeon: Billey Co, MD;  Location: [facility];  Service: Urology;  Laterality: Left;   CYSTOSCOPY WITH URETEROSCOPY AND STENT PLACEMENT Left 02/27/2018   Procedure: CYSTOSCOPY WITH URETEROSCOPY AND STENT Exchange;  Surgeon: Billey Co, MD;  Location: [facility];  Service: Urology;  Laterality: Left;   DENTAL SURGERY     screws   JOINT REPLACEMENT Left    knee   KNEE ARTHROPLASTY Right 12/23/2018   Procedure: RIGHT COMPUTER ASSISTED TOTAL KNEE ARTHROPLASTY;  Surgeon: Dereck Leep, MD;  Location: [facility];  Service: Orthopedics;  Laterality: Right;   KYPHOPLASTY N/A 02/21/2017   Procedure: WCHENIDPOEU-M3;  Surgeon: Hessie Knows, MD;  Location: [facility];  Service: Orthopedics;  Laterality: N/A;   laparotomy with biopsy  03/02/2007   PORTACATH PLACEMENT  2009   [place]   SQUAMOUS CELL CARCINOMA EXCISION     right arm   TOTAL KNEE ARTHROPLASTY Left    2009   URETEROSCOPY Left 01/30/2018   Procedure: URETEROSCOPY;  Surgeon: Billey Co, MD;  Location: [facility];  Service: Urology;  Laterality: Left;   VAGINAL HYSTERECTOMY  1971    Home Medications:  Allergies as of 11/08/2021       Reactions   Ciprofloxacin Itching   Whelps on face reported 03/16/2021   Milk-related Compounds Diarrhea, Nausea And Vomiting   Gas too   Latex Itching   Misc. Sulfonamide Containing Compounds    Milk (cow) Diarrhea, Nausea And Vomiting   Milk allergy   Sulfa Antibiotics Hives,  Itching        Medication List        Accurate as of November 08, 2021  5:23 PM. If you have any questions, ask your nurse or doctor.          amoxicillin-clavulanate 875-125 MG tablet Commonly known as: AUGMENTIN Take 1 tablet by mouth 2 (two) times daily.   Combivent Respimat 20-100 MCG/ACT Aers respimat Generic drug: Ipratropium-Albuterol INHALE 2 PUFFS BY MOUTH INTO THE LUNGS 3 TIMES DAILY        Allergies:  Allergies  Allergen Reactions   Ciprofloxacin Itching    Whelps on face reported 03/16/2021   Milk-Related Compounds Diarrhea and Nausea And Vomiting    Gas too   Latex Itching   Misc. Sulfonamide Containing Compounds    Milk (Cow) Diarrhea and Nausea And Vomiting    Milk allergy    Sulfa  Antibiotics Hives and Itching    Family History: Family History  Problem Relation Age of Onset   Breast cancer Sister    Dementia Sister    Cataracts Sister    Heart attack Brother    CAD Brother    Heart attack Brother    Leukemia Grandchild        granddaughter   Kidney disease Neg Hx    Bladder Cancer Neg Hx     Social History:  reports that she quit smoking about 32 years ago. Her smoking use included cigarettes. She smoked an average of .25 packs per day. She has never used smokeless tobacco. She reports that she does not currently use alcohol. She reports that she does not use drugs.  ROS: Pertinent ROS in HPI  Physical Exam: BP (!) 146/77   Pulse (!) 102   Constitutional:  Well nourished. Alert and oriented, No acute distress. HEENT: [place] AT, moist mucus membranes.  Trachea midline Cardiovascular: No clubbing, cyanosis, or edema. Respiratory: Normal respiratory effort, no increased work of breathing. Neurologic: Grossly intact, no focal deficits, moving all 4 extremities. Psychiatric: Normal mood and affect.   Laboratory Data: Urinalysis See Epic and HPI I have reviewed the labs.   Pertinent Imaging: N/A   Assessment & Plan:    1.  Suspected UTI -UA grossly infected -Urine is sent for culture -Start on Augmentin 875/125 twice daily empirically -She will be going to [place] so if we need to make changes to her antibiotic when urine culture results are available we are to contact [place], her daughter who lives at [facility], and sent the prescription to [facility]  Return for Pending urine culture results .  These notes generated with voice recognition software. I apologize for typographical errors.  [place], [place] [address]  [place], [place] [address] [phone]

## 2021-11-08 NOTE — Telephone Encounter (Signed)
Message from Sharene Skeans sent at 11/08/2021  9:19 AM EDT  Summary: UTI   Daughter thinks pt has a UTI due to strange behavior / and the [facility] a urine culture and they brought it to the office / pts daughter is wanting to have this done again today if possible ./ please advise         Dull eye looking  In bed this am usually "bright eyed and bushy tailed" but not her to be in bed. Memory worsens and is not herself.  Chief Complaint: pt still in bed and memory worse Symptoms: daughter suspects UTI Frequency: this am  Pertinent Negatives: Patient denies Daughter not with pt  Disposition: '[]'$ ED /'[]'$ Urgent Care (no appt availability in office) / '[x]'$ Appointment(In office/virtual)/ '[]'$  [facility]/ '[]'$ Home Care/ '[]'$ Refused Recommended Disposition /'[]'$ [place] [facility]/ '[]'$  Follow-up with PCP Additional Notes: pt scheduled for appt in am . Daughter requested an order to be placed so instead of bringing pt in the office in the am, the daughter can submit the sample instead. Per chart was last ordered 09/28/21. Daughter stated she will also call urologist.  Answer Assessment - Initial Assessment Questions 1. SYMPTOM: "What is the main symptom you are concerned about?" (e.g., weakness, numbness)     Daughter called pt and pt still in bed and memory is worse 2. ONSET: "When did this start?" (minutes, hours, days; while sleeping)     This am 3. LAST NORMAL: "When was the last time you (the patient) were normal (no symptoms)?"     Pt has dementia but is usually "bright eyed and bushy tailed in the am" 4. PATTERN "Does this come and go, or has it been constant since it started?"  "Is it present now?"     Behavior usually present when sick with UTI 5. CARDIAC SYMPTOMS: "Have you had any of the following symptoms: chest pain, difficulty breathing, palpitations?"     Daughter did not report- also not with pt 6. NEUROLOGIC SYMPTOMS: "Have  you had any of the following symptoms: headache, dizziness, vision loss, double vision, changes in speech, unsteady on your feet?"     Memory loss worse , fatigue (still in bed) 7. OTHER SYMPTOMS: "Do you have any other symptoms?"     no 8. PREGNANCY: "Is there any chance you are pregnant?" "When was your last menstrual period?"     N/a  Protocols used: Neurologic Deficit-A-AH

## 2021-11-09 ENCOUNTER — Ambulatory Visit: Payer: Medicare Other | Admitting: Physician Assistant

## 2021-11-13 LAB — CULTURE, URINE COMPREHENSIVE

## 2021-11-14 ENCOUNTER — Telehealth: Payer: Self-pay

## 2021-11-14 NOTE — Telephone Encounter (Signed)
Notified pts daughter as advised, she expressed understanding.

## 2021-11-14 NOTE — Telephone Encounter (Signed)
-----   Message from Nori Riis, [name] sent at 11/13/2021  4:48 PM EDT ----- Please let Mrs. Fonte's daughter, Nyoka Cowden, (on the blue sticker) that the urine culture was positive and that it is sensitive to the Augmentin.

## 2021-11-20 DIAGNOSIS — R1904 Left lower quadrant abdominal swelling, mass and lump: Secondary | ICD-10-CM | POA: Diagnosis not present

## 2021-11-20 DIAGNOSIS — Z87440 Personal history of urinary (tract) infections: Secondary | ICD-10-CM | POA: Diagnosis not present

## 2021-11-20 DIAGNOSIS — Z8572 Personal history of non-Hodgkin lymphomas: Secondary | ICD-10-CM | POA: Diagnosis not present

## 2021-11-20 DIAGNOSIS — K2289 Other specified disease of esophagus: Secondary | ICD-10-CM | POA: Diagnosis not present

## 2021-11-20 DIAGNOSIS — Z9221 Personal history of antineoplastic chemotherapy: Secondary | ICD-10-CM | POA: Diagnosis not present

## 2021-11-20 DIAGNOSIS — K449 Diaphragmatic hernia without obstruction or gangrene: Secondary | ICD-10-CM | POA: Diagnosis not present

## 2021-11-20 DIAGNOSIS — Z882 Allergy status to sulfonamides status: Secondary | ICD-10-CM | POA: Diagnosis not present

## 2021-11-20 DIAGNOSIS — Z960 Presence of urogenital implants: Secondary | ICD-10-CM | POA: Diagnosis not present

## 2021-11-20 DIAGNOSIS — N136 Pyonephrosis: Secondary | ICD-10-CM | POA: Diagnosis not present

## 2021-11-20 DIAGNOSIS — Z87891 Personal history of nicotine dependence: Secondary | ICD-10-CM | POA: Diagnosis not present

## 2021-11-20 DIAGNOSIS — R112 Nausea with vomiting, unspecified: Secondary | ICD-10-CM | POA: Diagnosis not present

## 2021-11-20 DIAGNOSIS — K224 Dyskinesia of esophagus: Secondary | ICD-10-CM | POA: Diagnosis not present

## 2021-11-20 DIAGNOSIS — R1032 Left lower quadrant pain: Secondary | ICD-10-CM | POA: Diagnosis not present

## 2021-11-21 DIAGNOSIS — Z9221 Personal history of antineoplastic chemotherapy: Secondary | ICD-10-CM | POA: Diagnosis not present

## 2021-11-21 DIAGNOSIS — N136 Pyonephrosis: Secondary | ICD-10-CM | POA: Diagnosis not present

## 2021-11-21 DIAGNOSIS — Z87440 Personal history of urinary (tract) infections: Secondary | ICD-10-CM | POA: Diagnosis not present

## 2021-11-21 DIAGNOSIS — Z87891 Personal history of nicotine dependence: Secondary | ICD-10-CM | POA: Diagnosis not present

## 2021-11-21 DIAGNOSIS — K449 Diaphragmatic hernia without obstruction or gangrene: Secondary | ICD-10-CM | POA: Diagnosis not present

## 2021-11-21 DIAGNOSIS — R1904 Left lower quadrant abdominal swelling, mass and lump: Secondary | ICD-10-CM | POA: Diagnosis not present

## 2021-11-21 DIAGNOSIS — K2289 Other specified disease of esophagus: Secondary | ICD-10-CM | POA: Diagnosis not present

## 2021-11-21 DIAGNOSIS — Z960 Presence of urogenital implants: Secondary | ICD-10-CM | POA: Diagnosis not present

## 2021-11-21 DIAGNOSIS — K224 Dyskinesia of esophagus: Secondary | ICD-10-CM | POA: Diagnosis not present

## 2021-11-21 DIAGNOSIS — Z882 Allergy status to sulfonamides status: Secondary | ICD-10-CM | POA: Diagnosis not present

## 2021-11-21 DIAGNOSIS — N390 Urinary tract infection, site not specified: Secondary | ICD-10-CM | POA: Diagnosis not present

## 2021-11-21 DIAGNOSIS — Z8572 Personal history of non-Hodgkin lymphomas: Secondary | ICD-10-CM | POA: Diagnosis not present

## 2021-11-22 DIAGNOSIS — N136 Pyonephrosis: Secondary | ICD-10-CM | POA: Diagnosis not present

## 2021-11-22 DIAGNOSIS — R1904 Left lower quadrant abdominal swelling, mass and lump: Secondary | ICD-10-CM | POA: Diagnosis not present

## 2021-11-22 DIAGNOSIS — K2289 Other specified disease of esophagus: Secondary | ICD-10-CM | POA: Diagnosis not present

## 2021-11-22 DIAGNOSIS — K449 Diaphragmatic hernia without obstruction or gangrene: Secondary | ICD-10-CM | POA: Diagnosis not present

## 2021-11-22 DIAGNOSIS — N390 Urinary tract infection, site not specified: Secondary | ICD-10-CM | POA: Diagnosis not present

## 2021-11-26 ENCOUNTER — Telehealth: Payer: Self-pay

## 2021-11-26 NOTE — Telephone Encounter (Addendum)
Transition Care Management Unsuccessful Follow-up Telephone Call  Date of discharge and from where:  [facility] 11-22-21 Dx: UTI  Attempts:  1st Attempt  Reason for unsuccessful TCM follow-up call:  Left voice message   Juanda Crumble LPN [place] Direct Dial [phone]   Transition Care Management Follow-up Telephone Call Date of discharge and from where: [facility] 11-22-21 Dx: UTI How have you been since you were released from the hospital? Feeling ok  Any questions or concerns? No  Items Reviewed: Did the pt receive and understand the discharge instructions provided? Yes  Medications obtained and verified? Yes  Other? No  Any new allergies since your discharge? No  Dietary orders reviewed? Yes Do you have support at home? Yes   Home Care and Equipment/Supplies: Were home health services ordered? no If so, what is the name of the agency? na  Has the agency set up a time to come to the patient's home? not applicable Were any new equipment or medical supplies ordered?  No What is the name of the medical supply agency? na Were you able to get the supplies/equipment? not applicable Do you have any questions related to the use of the equipment or supplies? No  Functional Questionnaire: (I = Independent and D = Dependent) ADLs: I  Bathing/Dressing- I  Meal Prep- I  Eating- I  Maintaining continence- I  Transferring/Ambulation- I  Managing Meds- I  Follow up appointments reviewed:  PCP Hospital f/u appt confirmed? Yes  Scheduled to see Dr Caryn Section  on 12-04-21 @ [facility] f/u appt confirmed? No . Are transportation arrangements needed? No  If their condition worsens, is the pt aware to call PCP or go to the Emergency Dept.? Yes Was the patient provided with contact information for the PCP's office or ED? Yes Was to pt encouraged to call back with questions or concerns? Yes   Juanda Crumble LPN [place] Direct Dial [phone]

## 2021-11-29 ENCOUNTER — Other Ambulatory Visit: Payer: Self-pay | Admitting: Family Medicine

## 2021-11-29 NOTE — Telephone Encounter (Signed)
Requested Prescriptions  Pending Prescriptions Disp Refills   COMBIVENT RESPIMAT 20-100 MCG/ACT AERS respimat [Pharmacy Med Name: COMBIVENT RESPIMAT 20-100 MCG/ACT I] 4 g 4    Sig: INHALE 2  PUFFS INTO THE LUNGS 3 TIMES DAILY     Pulmonology:  Combination Products - albuterol / ipratropium Failed - 11/29/2021  4:04 PM      Failed - Last BP in normal range    BP Readings from Last 1 Encounters:  11/08/21 (!) 146/77         Passed - Last Heart Rate in normal range    Pulse Readings from Last 1 Encounters:  11/08/21 (!) 102         Passed - Valid encounter within last 12 months    Recent Outpatient Visits           2 months ago Frequent UTI   [place], [place], MD   2 months ago Wound of left lower extremity, initial encounter   [facility], [place], [name]   3 months ago Urinary tract infection with hematuria, site unspecified   [place], DO   4 months ago Acute cystitis with hematuria   [facility] Mikey Kirschner, [name]   10 months ago Screening for tuberculosis   [employer], Dani Gobble, [name]       Future Appointments             In 5 days Fisher, Kirstie Peri, MD [facility], [place]   In 3 months Diamantina Providence, Herbert Seta, MD [place]

## 2021-12-03 NOTE — Progress Notes (Signed)
I,Roshena L Chambers,acting as a scribe for Lelon Huh, MD.,have documented all relevant documentation on the behalf of Lelon Huh, MD,as directed by  Lelon Huh, MD while in the presence of Lelon Huh, MD.   Established patient visit   Patient: Monica Singh   DOB: October 19, 1928   88 y.o. Female  MRN: [id number] Visit Date: 12/04/2021  Today's healthcare provider: Lelon Huh, MD   Chief Complaint  Patient presents with   Hospitalization Follow-up   Subjective    HPI  Follow up Hospitalization  Patient was admitted to [facility] in [place] on 11/20/2021 with a principal diagnosis of Pylonephrosis per patient discharge information that she brings in today. There are no other medical records to review. Her daughter states that she presented to ER with LLQ pain She was [facility] from [facility] on [facility]2021 She was treated for UTI. Treatment for this included antibiotics. Records indicate she was discharged on cephalexin '250mg'$  TID. Family reports she also had abdominal CT done.   Telephone follow up was attempted on 11/26/2021. She reports good compliance with treatment. She reports this condition is improved. LLQ pain has completely resolved.  Patient has completed all doses of antibiotics, but  still has mental status changes and confusion. The LLQ pain that had on presentation to ER   She is noted to be followed by urology for recurrent uti and history of UPJ obstruction.  ----------------------------------------------------------------------------------------- -   Medications: Outpatient Medications Prior to Visit  Medication Sig   COMBIVENT RESPIMAT 20-100 MCG/ACT AERS respimat INHALE 2  PUFFS INTO THE LUNGS 3 TIMES DAILY   [DISCONTINUED] amoxicillin-clavulanate (AUGMENTIN) 875-125 MG tablet Take 1 tablet by mouth 2 (two) times daily.   No facility-administered medications prior to visit.    Review of Systems   Constitutional:  Negative for appetite change, chills, fatigue and fever.  Respiratory:  Negative for chest tightness and shortness of breath.   Cardiovascular:  Negative for chest pain and palpitations.  Gastrointestinal:  Negative for abdominal pain, nausea and vomiting.  Neurological:  Negative for dizziness and weakness.  Psychiatric/Behavioral:  Positive for confusion.        Objective    BP (!) 109/59 (BP Location: Right Arm, Patient Position: Sitting, Cuff Size: Normal)   Pulse 97   Temp 98.3 F (36.8 C) (Oral)   Resp 20   Wt 144 lb (65.3 kg)   SpO2 98% Comment: room air  BMI 26.34 kg/m    Today's Vitals   12/04/21 1009  BP: (!) 109/59  Pulse: 97  Resp: 20  Temp: 98.3 F (36.8 C)  TempSrc: Oral  SpO2: 98%  Weight: 144 lb (65.3 kg)   Body mass index is 26.34 kg/m.   Physical Exam  Awake, alert, oriented x 3. In no apparent distress   Results for orders placed or performed in visit on 12/04/21  POCT Urinalysis Dipstick  Result Value Ref Range   Color, UA yellow    Clarity, UA cloudy    Glucose, UA Negative Negative   Bilirubin, UA small    Ketones, UA negative    Spec Grav, UA 1.015 1.010 - 1.025   Blood, UA Large (hemolyzed)    pH, UA 6.5 5.0 - 8.0   Protein, UA Positive (A) Negative   Urobilinogen, UA 0.2 0.2 or 1.0 E.U./dL   Nitrite, UA negative    Leukocytes, UA Large (3+) (A) Negative   Appearance     Odor  Assessment & Plan     1. Frequent UTI  - Urine Culture  Recently discharged after admission for LLQ which has resolved and apparently pyelonephritis. There are no hospital records for me to review. Have requested records from [facility].  - cefdinir (OMNICEF) 300 MG capsule; Take 2 capsules (600 mg total) by mouth daily for 7 days.  Dispense: 14 capsule; Refill: 0  2. Chronic constipation   Start  - Probiotic Product (ALIGN) 4 MG CAPS; Take 1 capsule (4 mg total) by mouth daily. - docusate sodium (COLACE) 100 MG  capsule; Take 1 capsule (100 mg total) by mouth 2 (two) times daily.      The entirety of the information documented in the History of Present Illness, Review of Systems and Physical Exam were personally obtained by me. Portions of this information were initially documented by the CMA and reviewed by me for thoroughness and accuracy.     Lelon Huh, MD  [facility] [phone] (phone) [phone] (fax)  [place]

## 2021-12-04 ENCOUNTER — Encounter: Payer: Self-pay | Admitting: Family Medicine

## 2021-12-04 ENCOUNTER — Ambulatory Visit (INDEPENDENT_AMBULATORY_CARE_PROVIDER_SITE_OTHER): Payer: Medicare Other | Admitting: Family Medicine

## 2021-12-04 VITALS — BP 109/59 | HR 97 | Temp 98.3°F | Resp 20 | Wt 144.0 lb

## 2021-12-04 DIAGNOSIS — K5900 Constipation, unspecified: Secondary | ICD-10-CM

## 2021-12-04 DIAGNOSIS — N390 Urinary tract infection, site not specified: Secondary | ICD-10-CM | POA: Diagnosis not present

## 2021-12-04 LAB — POCT URINALYSIS DIPSTICK
Glucose, UA: NEGATIVE
Ketones, UA: NEGATIVE
Nitrite, UA: NEGATIVE
Protein, UA: POSITIVE — AB
Spec Grav, UA: 1.015 (ref 1.010–1.025)
Urobilinogen, UA: 0.2 E.U./dL
pH, UA: 6.5 (ref 5.0–8.0)

## 2021-12-04 MED ORDER — DOCUSATE SODIUM 100 MG PO CAPS: 100.0000 mg | ORAL_CAPSULE | Freq: Two times a day (BID) | ORAL | Status: DC

## 2021-12-04 MED ORDER — ALIGN 4 MG PO CAPS: 1.0000 | ORAL_CAPSULE | Freq: Every day | ORAL | Status: DC

## 2021-12-04 MED ORDER — CEFDINIR 300 MG PO CAPS: 600.0000 mg | ORAL_CAPSULE | Freq: Every day | ORAL | 0 refills | Status: AC

## 2021-12-05 ENCOUNTER — Telehealth: Payer: Self-pay

## 2021-12-05 ENCOUNTER — Ambulatory Visit: Payer: Self-pay | Admitting: *Deleted

## 2021-12-05 NOTE — Telephone Encounter (Signed)
Summary: med questions   Merleen Nicely from [employer] , called in for med clarification cesvini 300 mg , wants to know if she should take this once twice a day, or twice a day at the same time, also wants to discuss her other meds, since patient has been self medicating.      Called caregiver Merleen Nicely from [facility] where patient is now residing. Patient was taking medications her self and now will need assistance. Medications patient daughter has brought to facility does not match medication listed at facility. Please clarify or reorder new antibiotic order:  Cefdinir (omnicef) '300mg'$  order to take 2 tablets to equal 600 mg daily . Patient's daughter requesting to administer 1 tablet equals 300 mg twice daily instead of taking 2 tablets equals 600 mg 1 time daily to decrease change of stomach upset for 7 days. Please clarify or reorder if combivent respimat inhaler can be left at bedside and given prn instead of 3 times daily. Also patient has more medications she was taking that is not listed on medication list. Please advise if patient order can be written for patient to continue taking what she took at home: Folic acid 1 mg daily, vit B12, vit D, metamucil and rosuvastatin  calcium 5 mg in am. Please fax new med list to [phone].     Reason for Disposition  [1] Caller has URGENT medicine question about med that PCP or specialist prescribed AND [2] triager unable to answer question  Answer Assessment - Initial Assessment Questions 1. NAME of MEDICINE: "What medicine(s) are you calling about?"     All of patient medications. Omnicef, folic acid, vit P59 vit D metamucil, rosuvastatin calcium, combivent inhaler 2. QUESTION: "What is your question?" (e.g., double dose of medicine, side effect)     Need clarification of updated medication list faxed to [facility]. Requesting new order for omnicef per patient daughter to allow patient to take '300mg'$  twice a day instead of total 600 mg daily. 3.  PRESCRIBER: "Who prescribed the medicine?" Reason: if prescribed by specialist, call should be referred to that group.     PCP 4. SYMPTOMS: "Do you have any symptoms?" If Yes, ask: "What symptoms are you having?"  "How bad are the symptoms (e.g., mild, moderate, severe)     Na  5. PREGNANCY:  "Is there any chance that you are pregnant?" "When was your last menstrual period?"     na  Protocols used: Medication Question Call-A-AH

## 2021-12-05 NOTE — Telephone Encounter (Signed)
If they need a physical signature then one of the providers in the office will need to sign. I will not be in until Friday.

## 2021-12-05 NOTE — Telephone Encounter (Signed)
Please advise 

## 2021-12-05 NOTE — Telephone Encounter (Signed)
Copied from [place] [id number]. Topic: General - Other >> Dec 05, 2021 11:36 AM Cyndi Bender wrote: Reason for CRM: Merleen Nicely with [facility] requests signature from Dr. Caryn Section to start the antibiotic. Cb# [phone]  fax# [phone]

## 2021-12-05 NOTE — Telephone Encounter (Signed)
Ok to administer 1 tablet of '300mg'$  cefdinir twice a day instead of 2 tablet once a day. Dr. Saunders Revel prescribed rosuvastatin and it looks like it was discontinue at the beginning of the year. as far as i can tell she is not taking it anymore.  Ok to continue folic acid '1mg'$  daily, over the counter vitamin D once a day and metamucil once a day.  Ok to take [employer] prn only.

## 2021-12-06 ENCOUNTER — Telehealth: Payer: Self-pay

## 2021-12-06 NOTE — Telephone Encounter (Signed)
Tried returning call. Left message to call back. OK for [facility] triage to advise.

## 2021-12-06 NOTE — Telephone Encounter (Signed)
Called, spoke with Monica Singh. She states that Dr. Maralyn Sago recommendations were given to her already by another nurse.

## 2021-12-06 NOTE — Telephone Encounter (Signed)
Received call from [place] at [facility] and shared note by provider.   Monica Singh has faxed over a blank FL2 for completion.   Monica Singh requests that the actual dosage for the vit D is on the form.

## 2021-12-06 NOTE — Telephone Encounter (Signed)
I called and spoke with Merleen Nicely from [facility]. I informed her that Dr. Caryn Section is out of the office until Friday. Merleen Nicely says that its fine to wait until tomorrow for the order. Patient has already started the antibiotic, but [facility] needs a signed order for their records. Order has been written up and placed on Dr. Maralyn Sago desk for signature.   Merleen Nicely is also requesting an updated FL2 form with patients current medications. Patient was previously managing her own medications, but the facility [facility]) is now taking over medication administration. Merleen Nicely states that the medication list they have doesn't match what the patient says she is supposed to be taking. Merleen Nicely is faxing over a black FL2 form for Dr. Caryn Section to fill out.

## 2021-12-07 NOTE — Telephone Encounter (Addendum)
I called and spoke back with Monica Singh. I advised her of Dr. Maralyn Sago message below. Monica Singh verbalized understanding. Monica Singh needs a new FL2 form filled out. She says her current  FL2 form does not match what our office says she is taking. Monica Singh has faxed [place] a blank FL2 form. Form received and placed on Dr. Maralyn Sago desk.

## 2021-12-08 LAB — URINE CULTURE

## 2021-12-10 ENCOUNTER — Telehealth: Payer: Self-pay

## 2021-12-10 NOTE — Telephone Encounter (Signed)
Informed patient daughter [name] of results and recommendations.

## 2021-12-10 NOTE — Telephone Encounter (Signed)
-----   Message from Birdie Sons, MD sent at 12/09/2021  7:30 AM EST ----- Urine culture shows infection sensitive to antibiotic that was prescribed. Symptoms should completely resolve by the time antibiotic is finished. Call back otherwise.

## 2021-12-11 ENCOUNTER — Telehealth: Payer: Self-pay | Admitting: Family Medicine

## 2021-12-11 DIAGNOSIS — M810 Age-related osteoporosis without current pathological fracture: Secondary | ICD-10-CM | POA: Diagnosis not present

## 2021-12-11 DIAGNOSIS — L851 Acquired keratosis [keratoderma] palmaris et plantaris: Secondary | ICD-10-CM | POA: Diagnosis not present

## 2021-12-11 DIAGNOSIS — M79675 Pain in left toe(s): Secondary | ICD-10-CM | POA: Diagnosis not present

## 2021-12-11 DIAGNOSIS — B351 Tinea unguium: Secondary | ICD-10-CM | POA: Diagnosis not present

## 2021-12-11 DIAGNOSIS — L603 Nail dystrophy: Secondary | ICD-10-CM | POA: Diagnosis not present

## 2021-12-11 DIAGNOSIS — M79674 Pain in right toe(s): Secondary | ICD-10-CM | POA: Diagnosis not present

## 2021-12-11 DIAGNOSIS — R269 Unspecified abnormalities of gait and mobility: Secondary | ICD-10-CM | POA: Diagnosis not present

## 2021-12-11 NOTE — Telephone Encounter (Signed)
That was all signed off and sent to medical records to be faxed.

## 2021-12-11 NOTE — Telephone Encounter (Signed)
[facility] called and has been trying to get an FL2 for over a week / they will need to start administering her medications due to the pt forgetting / they also need a current med list asap / please advise and fax to [phone]

## 2022-01-01 DIAGNOSIS — Z96653 Presence of artificial knee joint, bilateral: Secondary | ICD-10-CM | POA: Diagnosis not present

## 2022-01-01 DIAGNOSIS — Z96652 Presence of left artificial knee joint: Secondary | ICD-10-CM | POA: Diagnosis not present

## 2022-01-01 DIAGNOSIS — Z96651 Presence of right artificial knee joint: Secondary | ICD-10-CM | POA: Diagnosis not present

## 2022-01-02 ENCOUNTER — Telehealth: Payer: Self-pay

## 2022-01-02 ENCOUNTER — Other Ambulatory Visit: Payer: Self-pay

## 2022-01-02 DIAGNOSIS — N390 Urinary tract infection, site not specified: Secondary | ICD-10-CM

## 2022-01-02 NOTE — Telephone Encounter (Signed)
That's fine

## 2022-01-02 NOTE — Telephone Encounter (Signed)
Urine was dropped off here. Urine culture ordered and sent off.

## 2022-01-02 NOTE — Telephone Encounter (Signed)
Please review and advise.

## 2022-01-02 NOTE — Telephone Encounter (Signed)
Copied from [place] [id number]. Topic: General - Other >> Jan 02, 2022  9:36 AM [place] B wrote: Reason for CRM: Patient's daughter called in asking if she can bring urine sample of patient intoofice, since she is in nursing, and thinks she may have a UTI, because of cloudy urine

## 2022-01-02 NOTE — Telephone Encounter (Signed)
Patient daughter called to follow up and asked if the request could be sent to the nursing home directly without a request from PCP; they will not collect a urine sample.   [facility] Phone: [phone]  Please advise

## 2022-01-03 ENCOUNTER — Encounter: Payer: Self-pay | Admitting: Physician Assistant

## 2022-01-03 ENCOUNTER — Ambulatory Visit (INDEPENDENT_AMBULATORY_CARE_PROVIDER_SITE_OTHER): Payer: Medicare Other | Admitting: Physician Assistant

## 2022-01-03 VITALS — BP 129/73 | HR 87 | Resp 16 | Wt 143.0 lb

## 2022-01-03 DIAGNOSIS — R300 Dysuria: Secondary | ICD-10-CM | POA: Diagnosis not present

## 2022-01-03 DIAGNOSIS — R21 Rash and other nonspecific skin eruption: Secondary | ICD-10-CM | POA: Diagnosis not present

## 2022-01-03 LAB — POCT URINALYSIS DIPSTICK
Bilirubin, UA: NEGATIVE
Blood, UA: POSITIVE
Glucose, UA: NEGATIVE
Ketones, UA: NEGATIVE
Nitrite, UA: POSITIVE
Protein, UA: POSITIVE — AB
Spec Grav, UA: 1.01 (ref 1.010–1.025)
Urobilinogen, UA: 0.2 E.U./dL
pH, UA: 6 (ref 5.0–8.0)

## 2022-01-03 LAB — URINE CULTURE

## 2022-01-03 MED ORDER — FLUCONAZOLE 150 MG PO TABS: 150.0000 mg | ORAL_TABLET | Freq: Once | ORAL | 1 refills | Status: AC

## 2022-01-03 MED ORDER — CEFDINIR 300 MG PO CAPS: 300.0000 mg | ORAL_CAPSULE | Freq: Two times a day (BID) | ORAL | 0 refills | Status: DC

## 2022-01-03 NOTE — Progress Notes (Unsigned)
I,April Miller,acting as a Education administrator for [employer], [name].,have documented all relevant documentation on the behalf of [name], [name],as directed by  [employer], [name] while in the presence of [employer], [name].   Established patient visit   Patient: Monica Singh   DOB: 1928/08/08   88 y.o. Female  MRN: [id number] Visit Date: 01/03/2022  Today's healthcare provider: [name], [name]   Chief Complaint  Patient presents with   Urinary Tract Infection   Subjective    HPI  Patient is still having UTI symptoms. Endorses having Disorientation mostly. Pt is accompanied by her daughter/main historian. Last UTI was on 12/04/21, she was prescribed cefdinir for 7 days and her symptoms resolved. 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 Contributing factors of recurrent UTIs: age, indwelling stent and vaginal atrophy  Medications: Outpatient Medications Prior to Visit  Medication Sig   COMBIVENT RESPIMAT 20-100 MCG/ACT AERS respimat INHALE 2  PUFFS INTO THE LUNGS 3 TIMES DAILY   docusate sodium (COLACE) 100 MG capsule Take 1 capsule (100 mg total) by mouth 2 (two) times daily.   Probiotic Product (ALIGN) 4 MG CAPS Take 1 capsule (4 mg total) by mouth daily.   No facility-administered medications prior to visit.    Review of Systems  All other systems reviewed and are negative.  Except see HPI    Objective    BP 129/73 (BP Location: Left Arm, Patient Position: Sitting, Cuff Size: Normal)   Pulse 87   Resp 16   Wt 143 lb (64.9 kg)   SpO2 96%   BMI 26.16 kg/m    Physical Exam Vitals reviewed.  Constitutional:      General: She is not in acute distress.    Appearance: She is well-developed.  HENT:     Head: Normocephalic and atraumatic.  Eyes:     General: No scleral icterus.    Conjunctiva/sclera: Conjunctivae normal.   Cardiovascular:     Rate and Rhythm: Normal rate and regular rhythm.     Heart sounds: Normal heart sounds. No murmur heard. Pulmonary:     Effort: Pulmonary effort is normal. No respiratory distress.     Breath sounds: Normal breath sounds. No wheezing or rales.  Abdominal:     General: There is no distension.     Palpations: Abdomen is soft.     Tenderness: There is abdominal tenderness in the suprapubic area. There is no guarding or rebound.  Skin:    General: Skin is warm and dry.     Capillary Refill: Capillary refill takes less than 2 seconds.     Findings: Rash (numerous erythematous scaly patches/face, hairline) present.  Neurological:     Mental Status: She is alert and oriented to person, place, and time.  Psychiatric:        Behavior: Behavior normal.     No results found for any visits on 01/03/22.  Assessment & Plan     1. Dysuria Recurrent problem Due to UTI Last use several last UTI at the beginning of November was treated with cefdinir successfully. Before she was admitted for acute pyelonephritis. Pt was requested to release records from [facility]. Still waiting for records.  - POCT urinalysis dipstick was positive for blood and leukocytes - CULTURE, URINE COMPREHENSIVE - Urinalysis, Routine w reflex microscopic - cefdinir (OMNICEF) 300 MG capsule; Take 1 capsule (300 mg  total) by mouth 2 (two) times daily.  Dispense: 14 capsule; Refill: 0 - fluconazole (DIFLUCAN) 150 MG tablet; Take 1 tablet (150 mg total) by mouth once for 1 dose.  Dispense: 1 tablet; Refill: 1  Rash/wounds of face and hairline Could be due to seborrheic eczema vs atopic eczema vs candidiasis?/due to frequent ue of abx Referral to dermatology for assessment Will follow-up with dermatology in December Meanwhile advised to try ketoconazole shampoo OTC, topical hydrocortisone 2.5%/low potency steroid as her rash located on the face Continue to use moisturizer/emollient creams  for her rash Will FU PRN    The patient was advised to call back or seek an in-person evaluation if the symptoms worsen or if the condition fails to improve as anticipated.  I discussed the assessment and treatment plan with the patient. The patient was provided an opportunity to ask questions and all were answered. The patient agreed with the plan and demonstrated an understanding of the instructions.  The entirety of the information documented in the History of Present Illness, Review of Systems and Physical Exam were personally obtained by me. Portions of this information were initially documented by the CMA and reviewed by me for thoroughness and accuracy.  [name], [facility], [place] [phone] (phone) [phone] (fax)

## 2022-01-04 LAB — MICROSCOPIC EXAMINATION
Casts: NONE SEEN /lpf
RBC, Urine: >30 /hpf — AB (ref 0–2)
WBC, UA: >30 /hpf — AB (ref 0–5)

## 2022-01-04 LAB — URINALYSIS, ROUTINE W REFLEX MICROSCOPIC
Bilirubin, UA: NEGATIVE
Glucose, UA: NEGATIVE
Ketones, UA: NEGATIVE
Nitrite, UA: POSITIVE — AB
Specific Gravity, UA: 1.014 (ref 1.005–1.030)
Urobilinogen, Ur: 0.2 mg/dL (ref 0.2–1.0)
pH, UA: 6 (ref 5.0–7.5)

## 2022-01-05 DIAGNOSIS — K449 Diaphragmatic hernia without obstruction or gangrene: Secondary | ICD-10-CM | POA: Insufficient documentation

## 2022-01-06 LAB — CULTURE, URINE COMPREHENSIVE

## 2022-01-07 NOTE — Progress Notes (Signed)
Your urine culture showed Escherichia coli which should be sensitive to your current medication. Please, let me know if you are doing better.

## 2022-01-15 ENCOUNTER — Ambulatory Visit: Payer: Medicare Other | Admitting: Physician Assistant

## 2022-01-15 ENCOUNTER — Encounter: Payer: Self-pay | Admitting: Physician Assistant

## 2022-01-15 VITALS — BP 146/81 | HR 94 | Temp 98.1°F | Resp 16 | Wt 141.0 lb

## 2022-01-15 DIAGNOSIS — R319 Hematuria, unspecified: Secondary | ICD-10-CM

## 2022-01-15 DIAGNOSIS — N390 Urinary tract infection, site not specified: Secondary | ICD-10-CM

## 2022-01-15 LAB — POCT URINALYSIS DIPSTICK
Bilirubin, UA: NEGATIVE
Glucose, UA: NEGATIVE
Ketones, UA: NEGATIVE
Nitrite, UA: POSITIVE
Odor: POSITIVE
Protein, UA: POSITIVE — AB
Spec Grav, UA: 1.025 (ref 1.010–1.025)
Urobilinogen, UA: 0.2 E.U./dL
pH, UA: 6 (ref 5.0–8.0)

## 2022-01-15 MED ORDER — FLUCONAZOLE 150 MG PO TABS: 150.0000 mg | ORAL_TABLET | Freq: Once | ORAL | 1 refills | Status: AC

## 2022-01-15 MED ORDER — CEFUROXIME AXETIL 250 MG PO TABS: 250.0000 mg | ORAL_TABLET | Freq: Two times a day (BID) | ORAL | 0 refills | Status: DC

## 2022-01-15 NOTE — Progress Notes (Signed)
I,Joseline E Rosas,acting as a Education administrator for [employer], [name].,have documented all relevant documentation on the behalf of [name], [name],as directed by  [employer], [name] while in the presence of [employer], [name].  Established patient visit   Patient: Monica Singh   DOB: 10-Jun-1928   88 y.o. Female  MRN: [id number] Visit Date: 01/15/2022  Today's healthcare provider: [name], [name]   Chief Complaint  Patient presents with   Possible UTI   Subjective    Dysuria  This is a new problem. The current episode started yesterday. Associated symptoms comments: disoriented. Her past medical history is significant for recurrent UTIs.    Last was seen  for UTI on 01/03/22 for recurrent UTI, was treated with cefdinir x 7 days successfully.   Medications: Outpatient Medications Prior to Visit  Medication Sig   COMBIVENT RESPIMAT 20-100 MCG/ACT AERS respimat INHALE 2  PUFFS INTO THE LUNGS 3 TIMES DAILY   docusate sodium (COLACE) 100 MG capsule Take 1 capsule (100 mg total) by mouth 2 (two) times daily.   Probiotic Product (ALIGN) 4 MG CAPS Take 1 capsule (4 mg total) by mouth daily.   cefdinir (OMNICEF) 300 MG capsule Take 1 capsule (300 mg total) by mouth 2 (two) times daily.   No facility-administered medications prior to visit.    Review of Systems  Genitourinary:  Positive for dysuria.       Objective    BP (!) 146/81 (BP Location: Right Arm, Patient Position: Sitting, Cuff Size: Normal)   Pulse 94   Temp 98.1 F (36.7 C) (Oral)   Resp 16   Wt 141 lb (64 kg)   SpO2 97%   BMI 25.79 kg/m    Physical Exam Vitals reviewed.  Constitutional:      General: She is not in acute distress.    Appearance: She is well-developed.  HENT:     Head: Normocephalic and atraumatic.  Eyes:     General: No scleral icterus.    Conjunctiva/sclera: Conjunctivae normal.  Cardiovascular:     Rate and Rhythm: Normal rate and regular rhythm.     Heart sounds: Normal  heart sounds. No murmur heard. Pulmonary:     Effort: Pulmonary effort is normal. No respiratory distress.     Breath sounds: Normal breath sounds. No wheezing or rales.  Abdominal:     General: There is no distension.     Palpations: Abdomen is soft.     Tenderness: There is abdominal tenderness in the suprapubic area. There is no guarding or rebound.  Skin:    General: Skin is warm and dry.     Capillary Refill: Capillary refill takes less than 2 seconds.     Findings: No rash.  Neurological:     Mental Status: She is alert and oriented to person, place, and time.  Psychiatric:        Behavior: Behavior normal.     No results found for any visits on 01/15/22.  Assessment & Plan    1. Frequent UTI 2. Hematuria, unspecified type - POCT urinalysis dipstick pos for leukocytes and nitrites. - Urine Culture - Urine Microscopic Recurrent problem  On 01/03/22 last UTI with E.Coli, treated with cefdinir and fluconazole At the beginning of November, 11/7, had UTI with E.coli, treated with cefdinir On 11/08/21 UTI with E. Coli, treated with amoxicillin/clavulanate In the setting of left UPJ obstruction, managed with an indwelling Optima Bard ureteral stent / Exchanged yearly, last exchange was  on 05/2021 Other contributing factors: age and vaginal atrophy Per chart review,         September 28, 2021 E. Coli             August 28, 2021 E. Coli             July 03, 2021 E. Coli             May 15, 2021 E. coli and Klebsiella pneumoniae             March 07, 2021 Klebsiella pneumonia and Citrobacter amalonaticus             January 25, 2021 Klebsiella aerogenes             November 16, 2020 E. Coli      Per review of medication dispense Hx: Pt used from 2022-2023 the following abx: Nitrofurantoin, Ciprofloxacin, Doxycycline, Levofloxacin, Cephalexin, Cefuroxime, Cefdinir, Augmentin. Rx for Cefuroxime and Diflucan was sent to local pharmacy.  Will fu PRN  The patient was advised to  call back or seek an in-person evaluation if the symptoms worsen or if the condition fails to improve as anticipated.  I discussed the assessment and treatment plan with the patient. The patient was provided an opportunity to ask questions and all were answered. The patient agreed with the plan and demonstrated an understanding of the instructions.  The entirety of the information documented in the History of Present Illness, Review of Systems and Physical Exam were personally obtained by me. Portions of this information were initially documented by the CMA and reviewed by me for thoroughness and accuracy.   [name], [facility], [place] [phone] (phone) [phone] (fax)

## 2022-01-16 ENCOUNTER — Telehealth: Payer: Self-pay

## 2022-01-16 DIAGNOSIS — N390 Urinary tract infection, site not specified: Secondary | ICD-10-CM

## 2022-01-16 DIAGNOSIS — R319 Hematuria, unspecified: Secondary | ICD-10-CM

## 2022-01-16 MED ORDER — CEFUROXIME AXETIL 250 MG PO TABS: 250.0000 mg | ORAL_TABLET | Freq: Two times a day (BID) | ORAL | 0 refills | Status: AC

## 2022-01-16 NOTE — Telephone Encounter (Signed)
Please advise 

## 2022-01-16 NOTE — Telephone Encounter (Signed)
Copied from [place] [id number] [id number]. Topic: General - Inquiry >> Jan 16, 2022 11:53 AM Marcellus Scott wrote: Reason for CRM: Patient daughter called and stated pt was prescribed medication cefUROXime (CEFTIN) 250 MG tablet antibiotic for UTI . Pt stated [place] needs permission to give pt the medication. Please send form with permission directly to the nursing home without a request from PCP; they will not give pt medication.  [facility] Phone: [phone]

## 2022-01-16 NOTE — Telephone Encounter (Signed)
Received a call from [place] [facility] visitor) at [facility]. She advised that the Ceftin that was sent in yesterday was ordered for 2 tablets per day for 10 days. Fourteen tablets were ordered. Needs to have reorder sent to pharmacy to have in records. Elmyra Ricks stated she will refill with 20 as per SIG.

## 2022-01-16 NOTE — Addendum Note (Signed)
Addended by: [name] on: 01/16/2022 11:51 AM   Modules accepted: Orders

## 2022-01-16 NOTE — Telephone Encounter (Signed)
Patient advised.

## 2022-01-17 ENCOUNTER — Ambulatory Visit: Payer: Self-pay | Admitting: *Deleted

## 2022-01-17 LAB — SPECIMEN STATUS REPORT

## 2022-01-17 NOTE — Telephone Encounter (Signed)
  Chief Complaint: mouth pain due to multiple antibiotic use for UTIs Symptoms: tongue and inside of mouth red painful. Corners of mouth red painful. No ulcers or open areas noted per daughter on DPR. Can eat and drink and swallow  Frequency: today  Pertinent Negatives: Patient denies chest pain no tongue swelling no facial swelling no white spots on tongue. No fever Disposition: '[]'$ ED /'[]'$ Urgent Care (no appt availability in office) / '[]'$ Appointment(In office/virtual)/ '[]'$  [place] [facility]/ '[]'$ Home Care/ '[]'$ Refused Recommended Disposition /'[]'$ [place] [facility]/ '[x]'$  Follow-up with PCP Additional Notes:   No available appt with PCP until Jan 3. Please advise is medication can be given for mouth pain. Daughter asking if magic mouth wash is appropriate for sx. Please advise     Reason for Disposition  [1] MILD-MODERATE mouth pain AND [2] present > 3 days    Less than 3 days  Answer Assessment - Initial Assessment Questions 1. ONSET: "When did the mouth start hurting?" (e.g., hours or days ago)      Today  2. SEVERITY: "How bad is the pain?" (Scale 1-10; mild, moderate or severe)   - MILD (1-3):  doesn't interfere with eating or normal activities   - MODERATE (4-7): interferes with eating some solids and normal activities   - SEVERE (8-10):  excruciating pain, interferes with most normal activities   - SEVERE DYSPHAGIA: can't swallow liquids, drooling     Mild  3. SORES: "Are there any sores or ulcers in the mouth?" If Yes, ask: "What part of the mouth are the sores in?"     No  4. FEVER: "Do you have a fever?" If Yes, ask: "What is your temperature, how was it measured, and when did it start?"     No  5. CAUSE: "What do you think is causing the mouth pain?"     Multiple antibiotic use  6. OTHER SYMPTOMS: "Do you have any other symptoms?" (e.g., difficulty breathing)     Tongue red  Protocols used: Mouth Pain-A-AH

## 2022-01-18 ENCOUNTER — Ambulatory Visit: Payer: Self-pay | Admitting: *Deleted

## 2022-01-18 ENCOUNTER — Telehealth: Payer: Self-pay | Admitting: Family Medicine

## 2022-01-18 LAB — URINALYSIS, MICROSCOPIC ONLY

## 2022-01-18 LAB — SPECIMEN STATUS REPORT

## 2022-01-18 MED ORDER — NYSTATIN 100000 UNIT/ML MT SUSP
5.0000 mL | Freq: Four times a day (QID) | OROMUCOSAL | 1 refills | Status: DC
Start: 2022-01-18 — End: 2022-02-07

## 2022-01-18 NOTE — Telephone Encounter (Signed)
Please advise 

## 2022-01-18 NOTE — Telephone Encounter (Signed)
Summary: oral discomfort / rx req   The patient's daughter has called to share that the patient has experienced irritation in their mouth  The patient's mouth is red and flushed and their lower lip is red and irritated  The patient first expressed discomfort to their daughter yesterday 01/17/22  The patient's daughter would like them prescribed something for their discomfort  Please contact the patient's family member further when possible     Reason for Disposition  [1] MILD-MODERATE mouth pain AND [2] present > 3 days  Answer Assessment - Initial Assessment Questions 1. ONSET: "When did the mouth start hurting?" (e.g., hours or days ago)      Yesterday- patient is presently taking antibiotic- she is having symptoms in mouth 2. SEVERITY: "How bad is the pain?" (Scale 1-10; mild, moderate or severe)   - MILD (1-3):  doesn't interfere with eating or normal activities   - MODERATE (4-7): interferes with eating some solids and normal activities   - SEVERE (8-10):  excruciating pain, interferes with most normal activities   - SEVERE DYSPHAGIA: can't swallow liquids, drooling     Raw and irritated - mild/moderate 3. SORES: "Are there any sores or ulcers in the mouth?" If Yes, ask: "What part of the mouth are the sores in?"     No sores- "not yet"- raw and irritated 4. FEVER: "Do you have a fever?" If Yes, ask: "What is your temperature, how was it measured, and when did it start?"     no 5. CAUSE: "What do you think is causing the mouth pain?"     SE- antibiotic 6. OTHER SYMPTOMS: "Do you have any other symptoms?" (e.g., difficulty breathing)     No other symptoms- thinks may be mucus membrane SE vs yeast  Protocols used: Mouth Pain-A-AH

## 2022-01-18 NOTE — Telephone Encounter (Signed)
Patient daughter called and stated pt was prescribed medication nystatin (MYCOSTATIN) 100000 UNIT/ML suspension . [place] needs permission to give pt the medication. Please send form with permission directly to the nursing home without a request from PCP; they will not give pt medication.   [facility] Phone: [phone]  Please review.

## 2022-01-18 NOTE — Telephone Encounter (Signed)
[facility] will not give medication until an order is sent. Please do this before we leave today!

## 2022-01-18 NOTE — Telephone Encounter (Signed)
  Chief Complaint: mouth irritation with antibiotic Symptoms: mouth red and raw- no sores yet Frequency: symptoms started yesterday  Pertinent Negatives: Patient denies swelling, SOB Disposition: '[]'$ ED /'[]'$ Urgent Care (no appt availability in office) / '[]'$ Appointment(In office/virtual)/ '[]'$  [place] [facility]/ '[]'$ Home Care/ '[]'$ Refused Recommended Disposition /'[]'$ [place] [facility]/ '[x]'$  Follow-up with PCP Additional Notes: Patient has developed mouth irritation- not sure if break down of mucus membrane SE of antibiotic or developing yeat- did take Diflucan at beginning of antibiotic therapy- requesting provider advice- [facility]

## 2022-01-18 NOTE — Telephone Encounter (Signed)
Have sent prescription for nystatin to [facility]. Also recommend apply neosporin OINTMENT(not cream) to corners of mouth 2-3 times a day

## 2022-01-18 NOTE — Telephone Encounter (Signed)
Patient's daughter Nyoka Cowden was advised.

## 2022-01-18 NOTE — Telephone Encounter (Signed)
Letter was faxed.

## 2022-01-21 LAB — URINE CULTURE

## 2022-01-22 NOTE — Progress Notes (Signed)
Please, let pt know that her ur culture showed E. Coli again. Please, let us know if she is doing better.

## 2022-01-24 ENCOUNTER — Ambulatory Visit: Payer: Medicare Other | Admitting: Dermatology

## 2022-02-06 NOTE — Progress Notes (Signed)
02/07/2022 11:25 PM   [place] 1928/12/17 [id number]  Referring provider: Birdie Sons, [place],  [place] [address]  Urological history: 1. Left UPJ obstruction -creatinine (08/2021) 0.92 -managed with an indwelling Optima Bard ureteral stent exchanged yearly -last exchange 05/2021 -Diagnostic ureteroscopy in January 2020 did not show any concerning lesions and cytology was negative  2. rUTI's -contributing factors of age, indwelling stent and vaginal atrophy -documented urine cultures over the last year  January 15, 2022 E. coli and Pseudomonas aeruginosa  January 03, 2022 E. Coli  December 04, 2021 E. Coli  November 08, 2021 E. coli  September 28, 2021 E. Coli  August 28, 2021 E. Coli  July 03, 2021 E. Coli  May 15, 2021 E. coli and Klebsiella pneumoniae  March 07, 2021 Klebsiella pneumonia and Citrobacter amalonaticus    Chief Complaint  Patient presents with   Acute Visit   Urinary Tract Infection      HPI: [place] Monica Singh is a 89 y.o. female who presents for recheck on UTI with her daughter, Monica Singh.      UA yellow cloudy, specific gravity 1.020, 3+ blood, pH 5.5, 2+ protein, 3+ leukocyte, greater than 30 WBCs, 11-30 RBCs, 0-10 epithelial cells and many bacteria.  Her daughter states that she is starting to experience a foggy mind associated with dark colored urine.  Monica Singh states these are Monica Singh's symptoms of UTI.   Patient denies any modifying or aggravating factors.  Patient denies any gross hematuria, dysuria or suprapubic/flank pain.  Patient denies any fevers, chills, nausea or vomiting.     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 lat. elbow. SCCis arising in [place]. Excised 01/04/2009, margins free.    Squamous cell carcinoma of skin 07/26/2020   right hand   Syncope     Surgical History: Past Surgical History:  Procedure Laterality Date   ABDOMINAL SURGERY  2009   abdominal mass+ [place] lymphoma,   BACK SURGERY  01/2017   fusion. metal plate in neck at back   [address]   right eye and left ey   cervical neck fusion  1995   CYSTOSCOPY W/ RETROGRADES Left 01/30/2018   Procedure: CYSTOSCOPY WITH RETROGRADE PYELOGRAM;  Surgeon: Billey Co, MD;  Location: [facility];  Service: Urology;  Laterality: Left;   CYSTOSCOPY W/ RETROGRADES Left 08/05/2018   Procedure: CYSTOSCOPY WITH RETROGRADE PYELOGRAM;  Surgeon: Billey Co, MD;  Location: [facility];  Service: Urology;  Laterality: Left;   CYSTOSCOPY W/ RETROGRADES Left 06/01/2021   Procedure: CYSTOSCOPY WITH RETROGRADE PYELOGRAM;  Surgeon: Diamantina Providence,  Herbert Seta, MD;  Location: [facility];  Service: Urology;  Laterality: Left;   CYSTOSCOPY W/ URETERAL STENT PLACEMENT Left 08/05/2018   Procedure: CYSTOSCOPY WITH STENT Exchange;  Surgeon: Billey Co, MD;  Location: [facility];  Service: Urology;  Laterality: Left;   CYSTOSCOPY W/ URETERAL STENT PLACEMENT Left 05/21/2019   Procedure: CYSTOSCOPY WITH STENT REPLACEMENT;  Surgeon: Billey Co, MD;  Location: [facility];  Service: Urology;  Laterality: Left;   CYSTOSCOPY W/ URETERAL STENT PLACEMENT Left 05/05/2020   Procedure: CYSTOSCOPY WITH RETROGRADE PYELOGRAM/URETERAL STENT  EXCHANGE;  Surgeon: Billey Co, MD;  Location: [facility];  Service: Urology;  Laterality: Left;   CYSTOSCOPY W/ URETERAL STENT PLACEMENT Left 06/01/2021   Procedure: CYSTOSCOPY WITH STENT EXCHANGE;  Surgeon: Billey Co, MD;  Location: [facility];  Service: Urology;  Laterality: Left;   CYSTOSCOPY WITH BIOPSY Left 02/27/2018   Procedure: CYSTOSCOPY WITH BIOPSY;  Surgeon: Billey Co, MD;  Location: [facility];  Service: Urology;  Laterality: Left;   CYSTOSCOPY WITH STENT PLACEMENT Left 01/30/2018   Procedure: CYSTOSCOPY WITH STENT PLACEMENT;  Surgeon: Billey Co, MD;  Location: [facility];  Service: Urology;  Laterality: Left;   CYSTOSCOPY WITH URETEROSCOPY AND STENT PLACEMENT Left 02/27/2018   Procedure: CYSTOSCOPY WITH URETEROSCOPY AND STENT Exchange;  Surgeon: Billey Co, MD;  Location: [facility];  Service: Urology;  Laterality: Left;   DENTAL SURGERY     screws   JOINT REPLACEMENT Left    knee   KNEE ARTHROPLASTY Right 12/23/2018   Procedure: RIGHT COMPUTER ASSISTED TOTAL KNEE ARTHROPLASTY;  Surgeon: Dereck Leep, MD;  Location: [facility];  Service: Orthopedics;  Laterality: Right;   KYPHOPLASTY N/A 02/21/2017   Procedure: [id number];  Surgeon: Hessie Knows, MD;  Location: [facility];  Service: Orthopedics;  Laterality: N/A;   laparotomy with biopsy  03/02/2007   PORTACATH PLACEMENT  2009   [place]   SQUAMOUS CELL CARCINOMA EXCISION     right arm   TOTAL KNEE ARTHROPLASTY Left    2009   URETEROSCOPY Left 01/30/2018   Procedure: URETEROSCOPY;  Surgeon: Billey Co, MD;  Location: [facility];  Service: Urology;  Laterality: Left;   VAGINAL HYSTERECTOMY  1971    Home Medications:  Allergies as of 02/07/2022       Reactions   Ciprofloxacin Itching   Whelps on face reported 03/16/2021   Milk-related Compounds Diarrhea, Nausea And Vomiting   Gas too   Latex Itching   Misc. Sulfonamide Containing Compounds    Milk (cow) Diarrhea, Nausea And Vomiting    Milk allergy   Sulfa Antibiotics Hives, Itching        Medication List        Accurate as of February 07, 2022 11:59 PM. If you have any questions, ask your nurse or doctor.          STOP taking these medications    docusate sodium 100 MG capsule Commonly known as: COLACE Stopped by: Ayushi Pla, [name]   nystatin 100000 UNIT/ML suspension Commonly known as: MYCOSTATIN Stopped by: Isaih Bulger, [name]       TAKE these medications    Align 4 MG Caps Take 1 capsule (4 mg total) by mouth daily.   cefUROXime 500 MG tablet Commonly known as: CEFTIN Take 1 tablet (500 mg total) by mouth 2 (two) times daily with a meal. Started by: Olyvia Gopal, [name]   Combivent Respimat 20-100 MCG/ACT Aers respimat Generic drug: Ipratropium-Albuterol INHALE  2  PUFFS INTO THE LUNGS 3 TIMES DAILY   FOLIC ACID PO Take by mouth.   psyllium 95 % Pack Commonly known as: HYDROCIL/METAMUCIL Take 1 packet by mouth daily.   VITAMIN B-12 ER PO Take by mouth.        Allergies:  Allergies  Allergen Reactions   Ciprofloxacin Itching    Whelps on face reported 03/16/2021   Milk-Related Compounds Diarrhea and Nausea And Vomiting    Gas too   Latex Itching   Misc. Sulfonamide Containing Compounds    Milk (Cow) Diarrhea and Nausea And Vomiting    Milk allergy    Sulfa Antibiotics Hives and Itching    Family History: Family History  Problem Relation Age of Onset   Breast cancer Sister    Dementia Sister    Cataracts Sister    Heart attack Brother    CAD Brother    Heart attack Brother    Leukemia Grandchild        granddaughter   Kidney disease Neg Hx    Bladder Cancer Neg Hx     Social History:  reports that she quit smoking about 32 years ago. Her smoking use included cigarettes. She smoked an average of .25 packs per day. She has been exposed to tobacco smoke. She has never used smokeless tobacco. She reports that she does not currently use alcohol. She reports  that she does not use drugs.  ROS: Pertinent ROS in HPI  Physical Exam: BP 137/68   Pulse 96   Ht '5\' 6"'$  (1.676 m)   Wt 142 lb (64.4 kg)   BMI 22.92 kg/m   Constitutional:  Well nourished. Alert and oriented, No acute distress. HEENT: [place] AT, moist mucus membranes.  Trachea midline Cardiovascular: No clubbing, cyanosis, or edema. Respiratory: Normal respiratory effort, no increased work of breathing. Neurologic: Grossly intact, no focal deficits, moving all 4 extremities. Psychiatric: Normal mood and affect.    Laboratory Data: Urinalysis See Epic and HPI I have reviewed the labs.   Pertinent Imaging: N/A   Assessment & Plan:    1. Suspected UTI -UA grossly infected -Urine is sent for culture -Start on Ceftin - will adjust once culture is available if necessary -her daughter is concerned that she is getting too many UTI's and it is affecting her quality of life -once she completes the Ceftin, we will start Hiprex to see if we can reduce her UTI frequency  Return for keep appointment w/ Dr. Diamantina Providence in February .  These notes generated with voice recognition software. I apologize for typographical errors.  [place], [place] [address]  [place], [place] [address] [phone]

## 2022-02-07 ENCOUNTER — Encounter: Payer: Self-pay | Admitting: Urology

## 2022-02-07 ENCOUNTER — Ambulatory Visit (INDEPENDENT_AMBULATORY_CARE_PROVIDER_SITE_OTHER): Payer: Medicare Other | Admitting: Urology

## 2022-02-07 VITALS — BP 137/68 | HR 96 | Ht 66.0 in | Wt 142.0 lb

## 2022-02-07 DIAGNOSIS — N390 Urinary tract infection, site not specified: Secondary | ICD-10-CM

## 2022-02-07 DIAGNOSIS — Z87440 Personal history of urinary (tract) infections: Secondary | ICD-10-CM

## 2022-02-07 DIAGNOSIS — Z87448 Personal history of other diseases of urinary system: Secondary | ICD-10-CM | POA: Diagnosis not present

## 2022-02-07 DIAGNOSIS — R4182 Altered mental status, unspecified: Secondary | ICD-10-CM | POA: Diagnosis not present

## 2022-02-07 DIAGNOSIS — R8289 Other abnormal findings on cytological and histological examination of urine: Secondary | ICD-10-CM

## 2022-02-07 DIAGNOSIS — R82998 Other abnormal findings in urine: Secondary | ICD-10-CM

## 2022-02-07 MED ORDER — CEFUROXIME AXETIL 500 MG PO TABS: 500.0000 mg | ORAL_TABLET | Freq: Two times a day (BID) | ORAL | 0 refills | Status: DC

## 2022-02-08 LAB — MICROSCOPIC EXAMINATION: WBC, UA: >30 /hpf — AB (ref 0–5)

## 2022-02-08 LAB — URINALYSIS, COMPLETE
Bilirubin, UA: NEGATIVE
Glucose, UA: NEGATIVE
Ketones, UA: NEGATIVE
Nitrite, UA: NEGATIVE
Specific Gravity, UA: 1.02 (ref 1.005–1.030)
Urobilinogen, Ur: 0.2 mg/dL (ref 0.2–1.0)
pH, UA: 5.5 (ref 5.0–7.5)

## 2022-02-10 LAB — CULTURE, URINE COMPREHENSIVE

## 2022-02-10 MED ORDER — METHENAMINE HIPPURATE 1 G PO TABS: 1.0000 g | ORAL_TABLET | Freq: Two times a day (BID) | ORAL | 3 refills | Status: DC

## 2022-02-11 ENCOUNTER — Telehealth: Payer: Self-pay | Admitting: Family Medicine

## 2022-02-11 NOTE — Telephone Encounter (Signed)
-----  Message from Nori Riis, [name] sent at 02/10/2022  9:48 PM EST ----- Please let Mrs. Vilchis's daughter, [name], that her mother's urine culture was positive for infection and to continue the Ceftin.

## 2022-02-11 NOTE — Telephone Encounter (Signed)
Patient's daughter notified and voiced understanding. 

## 2022-02-12 ENCOUNTER — Telehealth: Payer: Self-pay | Admitting: Family Medicine

## 2022-02-12 NOTE — Telephone Encounter (Signed)
Patient's daughter, [name] (on [place]) called and wanted to know if she could get a refill of fluconazole (Diflucan) 150 mg. Patient has developed a yeast infection on her face from antibiotics she takes for consistent UTL's. Please advise patient's daughter [name] [phone].

## 2022-02-13 ENCOUNTER — Telehealth: Payer: Self-pay

## 2022-02-13 MED ORDER — FLUCONAZOLE 150 MG PO TABS: 150.0000 mg | ORAL_TABLET | Freq: Once | ORAL | 0 refills | Status: AC

## 2022-02-13 NOTE — Telephone Encounter (Signed)
RX printed and faxed to [facility].

## 2022-02-13 NOTE — Telephone Encounter (Signed)
[facility]

## 2022-02-13 NOTE — Telephone Encounter (Signed)
Called received via triage line   Daughter Gilford Rile needs order for Hiprex [facility]ed [facility] at [phone].   [facility] will not dispense to pt with out an order.  Pls advise.

## 2022-02-13 NOTE — Telephone Encounter (Signed)
Incoming call from [place] at [facility], she is requesting that order for Hiprex be sent over again as part of the order was cut off/messed up and they are unable to read.

## 2022-02-13 NOTE — Telephone Encounter (Signed)
Fax sent again.

## 2022-03-06 ENCOUNTER — Ambulatory Visit: Payer: Medicare Other | Admitting: Urology

## 2022-03-07 ENCOUNTER — Encounter: Payer: Self-pay | Admitting: Urology

## 2022-03-07 ENCOUNTER — Ambulatory Visit (INDEPENDENT_AMBULATORY_CARE_PROVIDER_SITE_OTHER): Payer: Medicare Other | Admitting: Urology

## 2022-03-07 VITALS — BP 144/79 | HR 108 | Ht 64.0 in | Wt 138.0 lb

## 2022-03-07 DIAGNOSIS — Z87440 Personal history of urinary (tract) infections: Secondary | ICD-10-CM | POA: Diagnosis not present

## 2022-03-07 DIAGNOSIS — N135 Crossing vessel and stricture of ureter without hydronephrosis: Secondary | ICD-10-CM | POA: Diagnosis not present

## 2022-03-07 DIAGNOSIS — N390 Urinary tract infection, site not specified: Secondary | ICD-10-CM

## 2022-03-07 NOTE — Progress Notes (Signed)
   03/07/2022 1:51 PM   [employer] 12/01/1928 [id number]  Reason for visit: Follow up chronic left UPJ obstruction, recurrent UTIs  HPI: 89 year old female who originally presented in the Fall of 2019 with recurrent urinary tract infections and severe left-sided flank pain and was found to have a new onset of left idiopathic UPJ obstruction with moderate to severe hydronephrosis.  Diagnostic ureteroscopy in January 2020 did not show any concerning lesions and cytology was negative.  She has been undergoing yearly stent changes since that time with resolution of her left-sided flank pain.  She is here with her daughter who again provides most of the history.  She had originally been doing well with stent changes, but reports she has had about 10 UTIs over the last year since stent was most recently changed in May 2023.  It is very difficult to tell if these are colonization versus true UTIs, as her only symptom is reported confusion per her daughter.  Her daughter does think the confusion improves after giving antibiotics.  She was recently started on Hip-Rex by [facility] for UTI prevention.  She previously was on topical estrogen cream, but with her forgetfulness eventually stopped using that.  I think it is reasonable to move up her stent change to the next few weeks.  Urinalysis does appear colonized/infected today, and will send for culture.  Agree with Hiprex, can also add cranberry tablets twice daily    Billey Co, MD  [facility] [address] [place], [place] [address] [phone]

## 2022-03-08 LAB — URINALYSIS, COMPLETE
Bilirubin, UA: NEGATIVE
Glucose, UA: NEGATIVE
Ketones, UA: NEGATIVE
Nitrite, UA: POSITIVE — AB
Specific Gravity, UA: 1.02 (ref 1.005–1.030)
Urobilinogen, Ur: 0.2 mg/dL (ref 0.2–1.0)
pH, UA: 5.5 (ref 5.0–7.5)

## 2022-03-08 LAB — MICROSCOPIC EXAMINATION
RBC, Urine: >30 /hpf — AB (ref 0–2)
WBC, UA: >30 /hpf — AB (ref 0–5)

## 2022-03-11 ENCOUNTER — Telehealth: Payer: Self-pay | Admitting: *Deleted

## 2022-03-11 NOTE — Telephone Encounter (Signed)
Patient daughter called in today and wants to know if her mom needs a antibiotic sent in. She is having uti symptoms still. Confusion and burning.

## 2022-03-12 ENCOUNTER — Telehealth: Payer: Self-pay

## 2022-03-12 DIAGNOSIS — N390 Urinary tract infection, site not specified: Secondary | ICD-10-CM

## 2022-03-12 LAB — CULTURE, URINE COMPREHENSIVE

## 2022-03-12 MED ORDER — CEPHALEXIN 500 MG PO CAPS: 500.0000 mg | ORAL_CAPSULE | Freq: Two times a day (BID) | ORAL | 0 refills | Status: AC

## 2022-03-12 NOTE — Telephone Encounter (Signed)
[facility] is requesting results of Urine culture (2/8) and order faxed for ATB to  [phone].   Informed Merleen Nicely that the prelim results for culture are in at this time.   Charge nurse- Judson Roch

## 2022-03-12 NOTE — Telephone Encounter (Signed)
RX printed as well as lab results. Faxed to [facility].

## 2022-03-12 NOTE — Telephone Encounter (Signed)
Please see culture results and advise. Pt is symptomatic with dysuria.

## 2022-04-08 ENCOUNTER — Ambulatory Visit: Payer: Self-pay

## 2022-04-08 ENCOUNTER — Telehealth: Payer: Self-pay | Admitting: Family Medicine

## 2022-04-08 NOTE — Telephone Encounter (Signed)
Patient's daughter called, left VM to return the call to the office to discuss pt's symptoms with a nurse.  Summary: possible UTI,cough, a sore throat, and is negative for COVID.   Pt daughter stated pt has had a UTI in January and February and possibly has one now. Pt is forgetful. Stated pt also has a very alarming cough, a sore throat, and is negative for COVID.  Per pt daughter request scheduled for tomorrow as she is out of town and has someone that can take pt to be seen .  Seeking clinical advice.

## 2022-04-08 NOTE — Telephone Encounter (Signed)
That's fine.... do they need this sent to pharmacy or to [facility].

## 2022-04-08 NOTE — Telephone Encounter (Signed)
Monica Singh with [facility] where the patient is a resident called in requesting to get a prn script for zyrtec for the patient. The patient was recently at the [facility] and the son gave her a zyrtec and it helped her. Please assist patient further as she uses   [place], [place] Phone: [phone]  Fax: [phone]

## 2022-04-08 NOTE — Progress Notes (Signed)
   I,Ronae Noell S Shaquavia Whisonant,acting as a Education administrator for Lavon Paganini, MD.,have documented all relevant documentation on the behalf of Lavon Paganini, MD,as directed by  Lavon Paganini, MD while in the presence of Lavon Paganini, MD.     Established patient visit   Patient: Monica Singh   DOB: Jun 01, 1928   89 y.o. Female  MRN: [id number] Visit Date: 04/09/2022  Today's healthcare provider: Lavon Paganini, MD   No chief complaint on file.  Subjective    HPI  Urinary symptoms  She reports recurrent {urinary symptoms:765916}. The current episode started {onset initial:119223} and is {progression:119226}. Patient states symptoms are {severity:119268} in intensity, occurring {frequency of symptoms:119294}. She  {recent treatment:18834} been recently treated for similar symptoms.    Associated symptoms: {Yes/No:20286} abdominal pain {Yes/No:20286} back pain  {Yes/No:20286} chills {Yes/No:20286} constipation  {Yes/No:20286} cramping {Yes/No:20286} diarrhea  {Yes/No:20286} discharge {Yes/No:20286} fever  {Yes/No:20286} hematuria {Yes/No:20286} nausea  {Yes/No:20286} vomiting    --------------------------------------------------------------------------------------- Upper respiratory symptoms She complains of {uri sx's' brief:15453}.with {systemic_sx:15294}. Onset of symptoms was {onset initial:119223} and {progression:119226}.She {hydration history:15378}.  Past history is significant for {respiratory illness:412}. Patient is non-smoker. Negative home COVID test.   ---------------------------------------------------------------------------------------------------   Medications: Outpatient Medications Prior to Visit  Medication Sig   COMBIVENT RESPIMAT 20-100 MCG/ACT AERS respimat INHALE 2  PUFFS INTO THE LUNGS 3 TIMES DAILY   Cyanocobalamin (VITAMIN B-12 ER PO) Take by mouth.   FOLIC ACID PO Take by mouth.   methenamine (HIPREX) 1 g tablet Take 1 tablet (1 g total) by  mouth 2 (two) times daily with a meal.   Probiotic Product (ALIGN) 4 MG CAPS Take 1 capsule (4 mg total) by mouth daily.   psyllium (HYDROCIL/METAMUCIL) 95 % PACK Take 1 packet by mouth daily.   No facility-administered medications prior to visit.    Review of Systems  HENT:  Positive for sore throat.   Respiratory:  Positive for cough.   Genitourinary:  Positive for dysuria.    {Labs  Heme  Chem  Endocrine  Serology  Results Review (optional):23779}   Objective    There were no vitals taken for this visit. {Show previous vital signs (optional):23777}  Physical Exam  ***  No results found for any visits on 04/09/22.  Assessment & Plan     ***  No follow-ups on file.      {provider attestation***:1}   Lavon Paganini, MD  [facility] [phone] (phone) [phone] (fax)  [place]

## 2022-04-08 NOTE — Telephone Encounter (Signed)
Spoke to [employer] regarding verbal order ok by Dr. Caryn Section

## 2022-04-08 NOTE — Telephone Encounter (Signed)
Chief Complaint: Cough and UTI Symptoms: confusion, Cough, sore throat Frequency: Saturday Pertinent Negatives: Patient denies fever - No fever Disposition: '[]'$ ED /'[]'$ Urgent Care (no appt availability in office) / '[x]'$ Appointment(In office/virtual)/ '[]'$  [place] [facility]/ '[]'$ Home Care/ '[]'$ Refused Recommended Disposition /'[]'$ [facility] [facility]/ '[]'$  Follow-up with PCP Additional Notes: Call from Pt's daughter, [name]. Per [name] pt is acting confused which is often the only sign of a UTI for the pt.  PT does not have any of the more common UTI s/s.  Pt is coming in  tomorrow afternoon for UTI.   PT also has a cough and sore throat.  Delsym was purchased for pt.  PT must self administer this medication unless an order is faxed from provider.   Daughter would like provider to fax an order to [facility] so they will give medicine to pt.  Please advise.      Summary: possible UTI,cough, a sore throat, and is negative for COVID.   Pt daughter stated pt has had a UTI in January and February and possibly has one now. Pt is forgetful. Stated pt also has a very alarming cough, a sore throat, and is negative for COVID.  Per pt daughter request scheduled for tomorrow as she is out of town and has someone that can take pt to be seen .  Seeking clinical advice.     Reason for Disposition  [1] Continuous (nonstop) coughing interferes with work or school AND [2] no improvement using cough treatment per Care Advice  All other urine symptoms  Answer Assessment - Initial Assessment Questions 1. ONSET: "When did the cough begin?"      Saturday 2. SEVERITY: "How bad is the cough today?"      Moderate 3. SPUTUM: "Describe the color of your sputum" (none, dry cough; clear, white, yellow, green)      4. HEMOPTYSIS: "Are you coughing up any blood?" If so ask: "How much?" (flecks, streaks, tablespoons, etc.)      5. DIFFICULTY BREATHING: "Are you having difficulty breathing?" If Yes, ask: "How bad  is it?" (e.g., mild, moderate, severe)    - MILD: No SOB at rest, mild SOB with walking, speaks normally in sentences, can lie down, no retractions, pulse < 100.    - MODERATE: SOB at rest, SOB with minimal exertion and prefers to sit, cannot lie down flat, speaks in phrases, mild retractions, audible wheezing, pulse 100-120.    - SEVERE: Very SOB at rest, speaks in single words, struggling to breathe, sitting hunched forward, retractions, pulse > 120      mild 6. FEVER: "Do you have a fever?" If Yes, ask: "What is your temperature, how was it measured, and when did it start?"     no 7. CARDIAC HISTORY: "Do you have any history of heart disease?" (e.g., heart attack, congestive heart failure)       8. LUNG HISTORY: "Do you have any history of lung disease?"  (e.g., pulmonary embolus, asthma, emphysema)      9. PE RISK FACTORS: "Do you have a history of blood clots?" (or: recent major surgery, recent prolonged travel, bedridden)      10. OTHER SYMPTOMS: "Do you have any other symptoms?" (e.g., runny nose, wheezing, chest pain)       Sore throat - COVID negative  Answer Assessment - Initial Assessment Questions 1. SYMPTOM: "What's the main symptom you're concerned about?" (e.g., frequency, incontinence)     Confusion 2. ONSET: "When did the  *No Answer*  start?"     UTI 3. PAIN: "Is there any pain?" If Yes, ask: "How bad is it?" (Scale: 1-10; mild, moderate, severe)     unknown 4. CAUSE: "What do you think is causing the symptoms?"     UTI 5. OTHER SYMPTOMS: "Do you have any other symptoms?" (e.g., blood in urine, fever, flank pain, pain with urination)  Protocols used: Cough - Acute Non-Productive-A-AH, Urinary Symptoms-A-AH

## 2022-04-09 ENCOUNTER — Encounter: Payer: Self-pay | Admitting: Family Medicine

## 2022-04-09 ENCOUNTER — Ambulatory Visit (INDEPENDENT_AMBULATORY_CARE_PROVIDER_SITE_OTHER): Payer: Medicare Other | Admitting: Family Medicine

## 2022-04-09 VITALS — BP 152/77 | HR 97 | Temp 99.4°F | Resp 24 | Wt 132.0 lb

## 2022-04-09 DIAGNOSIS — J441 Chronic obstructive pulmonary disease with (acute) exacerbation: Secondary | ICD-10-CM | POA: Diagnosis not present

## 2022-04-09 DIAGNOSIS — N3091 Cystitis, unspecified with hematuria: Secondary | ICD-10-CM

## 2022-04-09 LAB — POCT URINALYSIS DIPSTICK
Bilirubin, UA: NEGATIVE
Glucose, UA: NEGATIVE
Ketones, UA: NEGATIVE
Nitrite, UA: NEGATIVE
Protein, UA: POSITIVE — AB
Spec Grav, UA: 1.02 (ref 1.010–1.025)
Urobilinogen, UA: 0.2 E.U./dL
pH, UA: 6 (ref 5.0–8.0)

## 2022-04-09 MED ORDER — PREDNISONE 20 MG PO TABS: 40.0000 mg | ORAL_TABLET | Freq: Every day | ORAL | 0 refills | Status: DC

## 2022-04-09 MED ORDER — CEPHALEXIN 500 MG PO CAPS: 500.0000 mg | ORAL_CAPSULE | Freq: Three times a day (TID) | ORAL | 0 refills | Status: DC

## 2022-04-09 MED ORDER — AZITHROMYCIN 250 MG PO TABS: ORAL_TABLET | ORAL | 0 refills | Status: DC

## 2022-04-09 NOTE — Patient Instructions (Signed)
Stop Hiprex while on antibiotic for UTI

## 2022-04-10 ENCOUNTER — Inpatient Hospital Stay
Admission: EM | Admit: 2022-04-10 | Discharge: 2022-04-15 | DRG: 190 | Disposition: A | Discharge status: Skilled Nursing Facility | Payer: Medicare Other | Source: Skilled Nursing Facility | Attending: Family Medicine | Admitting: Family Medicine

## 2022-04-10 ENCOUNTER — Encounter: Payer: Self-pay | Admitting: Internal Medicine

## 2022-04-10 ENCOUNTER — Emergency Department: Payer: Medicare Other

## 2022-04-10 ENCOUNTER — Other Ambulatory Visit: Payer: Self-pay

## 2022-04-10 DIAGNOSIS — W1830XA Fall on same level, unspecified, initial encounter: Secondary | ICD-10-CM | POA: Diagnosis not present

## 2022-04-10 DIAGNOSIS — J101 Influenza due to other identified influenza virus with other respiratory manifestations: Secondary | ICD-10-CM | POA: Diagnosis present

## 2022-04-10 DIAGNOSIS — Z9221 Personal history of antineoplastic chemotherapy: Secondary | ICD-10-CM

## 2022-04-10 DIAGNOSIS — Z96653 Presence of artificial knee joint, bilateral: Secondary | ICD-10-CM | POA: Diagnosis present

## 2022-04-10 DIAGNOSIS — R531 Weakness: Secondary | ICD-10-CM

## 2022-04-10 DIAGNOSIS — J441 Chronic obstructive pulmonary disease with (acute) exacerbation: Secondary | ICD-10-CM | POA: Diagnosis not present

## 2022-04-10 DIAGNOSIS — G9340 Encephalopathy, unspecified: Secondary | ICD-10-CM

## 2022-04-10 DIAGNOSIS — N189 Chronic kidney disease, unspecified: Secondary | ICD-10-CM | POA: Diagnosis present

## 2022-04-10 DIAGNOSIS — I130 Hypertensive heart and chronic kidney disease with heart failure and stage 1 through stage 4 chronic kidney disease, or unspecified chronic kidney disease: Secondary | ICD-10-CM | POA: Diagnosis present

## 2022-04-10 DIAGNOSIS — I10 Essential (primary) hypertension: Secondary | ICD-10-CM | POA: Insufficient documentation

## 2022-04-10 DIAGNOSIS — Y92092 Bedroom in other non-institutional residence as the place of occurrence of the external cause: Secondary | ICD-10-CM

## 2022-04-10 DIAGNOSIS — Z8572 Personal history of non-Hodgkin lymphomas: Secondary | ICD-10-CM

## 2022-04-10 DIAGNOSIS — Z981 Arthrodesis status: Secondary | ICD-10-CM

## 2022-04-10 DIAGNOSIS — Z1152 Encounter for screening for COVID-19: Secondary | ICD-10-CM

## 2022-04-10 DIAGNOSIS — M1990 Unspecified osteoarthritis, unspecified site: Secondary | ICD-10-CM | POA: Diagnosis present

## 2022-04-10 DIAGNOSIS — R0902 Hypoxemia: Secondary | ICD-10-CM | POA: Diagnosis present

## 2022-04-10 DIAGNOSIS — Z90710 Acquired absence of both cervix and uterus: Secondary | ICD-10-CM

## 2022-04-10 DIAGNOSIS — Z882 Allergy status to sulfonamides status: Secondary | ICD-10-CM

## 2022-04-10 DIAGNOSIS — E871 Hypo-osmolality and hyponatremia: Principal | ICD-10-CM | POA: Diagnosis present

## 2022-04-10 DIAGNOSIS — Z8619 Personal history of other infectious and parasitic diseases: Secondary | ICD-10-CM

## 2022-04-10 DIAGNOSIS — M25552 Pain in left hip: Secondary | ICD-10-CM | POA: Diagnosis present

## 2022-04-10 DIAGNOSIS — W19XXXA Unspecified fall, initial encounter: Secondary | ICD-10-CM

## 2022-04-10 DIAGNOSIS — G3184 Mild cognitive impairment, so stated: Secondary | ICD-10-CM | POA: Diagnosis present

## 2022-04-10 DIAGNOSIS — Z9842 Cataract extraction status, left eye: Secondary | ICD-10-CM

## 2022-04-10 DIAGNOSIS — Z87891 Personal history of nicotine dependence: Secondary | ICD-10-CM

## 2022-04-10 DIAGNOSIS — L570 Actinic keratosis: Secondary | ICD-10-CM | POA: Diagnosis present

## 2022-04-10 DIAGNOSIS — Z8249 Family history of ischemic heart disease and other diseases of the circulatory system: Secondary | ICD-10-CM

## 2022-04-10 DIAGNOSIS — E038 Other specified hypothyroidism: Secondary | ICD-10-CM | POA: Diagnosis present

## 2022-04-10 DIAGNOSIS — B9620 Unspecified Escherichia coli [E. coli] as the cause of diseases classified elsewhere: Secondary | ICD-10-CM | POA: Diagnosis present

## 2022-04-10 DIAGNOSIS — Z87440 Personal history of urinary (tract) infections: Secondary | ICD-10-CM

## 2022-04-10 DIAGNOSIS — Z9841 Cataract extraction status, right eye: Secondary | ICD-10-CM

## 2022-04-10 DIAGNOSIS — N390 Urinary tract infection, site not specified: Secondary | ICD-10-CM | POA: Diagnosis present

## 2022-04-10 DIAGNOSIS — Z91011 Allergy to milk products: Secondary | ICD-10-CM

## 2022-04-10 DIAGNOSIS — G9341 Metabolic encephalopathy: Secondary | ICD-10-CM | POA: Diagnosis present

## 2022-04-10 DIAGNOSIS — Z881 Allergy status to other antibiotic agents status: Secondary | ICD-10-CM

## 2022-04-10 DIAGNOSIS — Z79899 Other long term (current) drug therapy: Secondary | ICD-10-CM

## 2022-04-10 DIAGNOSIS — M810 Age-related osteoporosis without current pathological fracture: Secondary | ICD-10-CM | POA: Diagnosis present

## 2022-04-10 DIAGNOSIS — I509 Heart failure, unspecified: Secondary | ICD-10-CM | POA: Diagnosis present

## 2022-04-10 DIAGNOSIS — I340 Nonrheumatic mitral (valve) insufficiency: Secondary | ICD-10-CM | POA: Diagnosis present

## 2022-04-10 DIAGNOSIS — E861 Hypovolemia: Secondary | ICD-10-CM | POA: Diagnosis present

## 2022-04-10 DIAGNOSIS — W06XXXA Fall from bed, initial encounter: Secondary | ICD-10-CM | POA: Diagnosis present

## 2022-04-10 DIAGNOSIS — Z85828 Personal history of other malignant neoplasm of skin: Secondary | ICD-10-CM

## 2022-04-10 HISTORY — DX: Essential (primary) hypertension: I10

## 2022-04-10 LAB — MRSA NEXT GEN BY PCR, NASAL: MRSA by PCR Next Gen: NOT DETECTED

## 2022-04-10 LAB — COMPREHENSIVE METABOLIC PANEL
ALT: 16 U/L (ref 0–44)
AST: 23 U/L (ref 15–41)
Albumin: 4.1 g/dL (ref 3.5–5.0)
Alkaline Phosphatase: 71 U/L (ref 38–126)
Anion gap: 12 (ref 5–15)
BUN: 17 mg/dL (ref 8–23)
CO2: 25 mmol/L (ref 22–32)
Calcium: 8.7 mg/dL — ABNORMAL LOW (ref 8.9–10.3)
Chloride: 88 mmol/L — ABNORMAL LOW (ref 98–111)
Creatinine, Ser: 0.91 mg/dL (ref 0.44–1.00)
GFR, Estimated: 60 mL/min — ABNORMAL LOW (ref >60)
Glucose, Bld: 139 mg/dL — ABNORMAL HIGH (ref 70–99)
Potassium: 3.4 mmol/L — ABNORMAL LOW (ref 3.5–5.1)
Sodium: 125 mmol/L — ABNORMAL LOW (ref 135–145)
Total Bilirubin: 0.7 mg/dL (ref 0.3–1.2)
Total Protein: 8 g/dL (ref 6.5–8.1)

## 2022-04-10 LAB — CBC WITH DIFFERENTIAL/PLATELET
Abs Immature Granulocytes: 0.05 10*3/uL (ref 0.00–0.07)
Basophils Absolute: 0.1 10*3/uL (ref 0.0–0.1)
Basophils Relative: 1 %
Eosinophils Absolute: 0.1 10*3/uL (ref 0.0–0.5)
Eosinophils Relative: 0 %
HCT: 41.3 % (ref 36.0–46.0)
Hemoglobin: 13.4 g/dL (ref 12.0–15.0)
Immature Granulocytes: 0 %
Lymphocytes Relative: 13 %
Lymphs Abs: 1.5 10*3/uL (ref 0.7–4.0)
MCH: 27.7 pg (ref 26.0–34.0)
MCHC: 32.4 g/dL (ref 30.0–36.0)
MCV: 85.3 fL (ref 80.0–100.0)
Monocytes Absolute: 0.6 10*3/uL (ref 0.1–1.0)
Monocytes Relative: 5 %
Neutro Abs: 9.4 10*3/uL — ABNORMAL HIGH (ref 1.7–7.7)
Neutrophils Relative %: 81 %
Platelets: 362 10*3/uL (ref 150–400)
RBC: 4.84 MIL/uL (ref 3.87–5.11)
RDW: 14.5 % (ref 11.5–15.5)
WBC: 11.6 10*3/uL — ABNORMAL HIGH (ref 4.0–10.5)
nRBC: 0 % (ref 0.0–0.2)

## 2022-04-10 LAB — SAMPLE TO BLOOD BANK

## 2022-04-10 LAB — RESP PANEL BY RT-PCR (RSV, FLU A&B, COVID)  RVPGX2
Influenza A by PCR: NEGATIVE
Influenza B by PCR: POSITIVE — AB
Resp Syncytial Virus by PCR: NEGATIVE
SARS Coronavirus 2 by RT PCR: NEGATIVE

## 2022-04-10 LAB — URINALYSIS, MICROSCOPIC ONLY
Casts: NONE SEEN /lpf
WBC, UA: >30 /hpf — AB (ref 0–5)

## 2022-04-10 LAB — URINALYSIS, ROUTINE W REFLEX MICROSCOPIC
Bilirubin Urine: NEGATIVE
Glucose, UA: NEGATIVE mg/dL
Ketones, ur: 5 mg/dL — AB
Nitrite: NEGATIVE
Protein, ur: 100 mg/dL — AB
Specific Gravity, Urine: 1.015 (ref 1.005–1.030)
WBC, UA: >50 WBC/hpf (ref 0–5)
pH: 5 (ref 5.0–8.0)

## 2022-04-10 LAB — CK: Total CK: 53 U/L (ref 38–234)

## 2022-04-10 MED ORDER — OSELTAMIVIR PHOSPHATE 75 MG PO CAPS
75.0000 mg | ORAL_CAPSULE | Freq: Once | ORAL | Status: AC
  Administered 2022-04-10: 75 mg via ORAL
  Filled 2022-04-10: qty 1

## 2022-04-10 MED ORDER — IPRATROPIUM-ALBUTEROL 0.5-2.5 (3) MG/3ML IN SOLN
3.0000 mL | Freq: Once | RESPIRATORY_TRACT | Status: AC
  Administered 2022-04-10: 3 mL via RESPIRATORY_TRACT
  Filled 2022-04-10: qty 3

## 2022-04-10 MED ORDER — ONDANSETRON HCL 4 MG/2ML IJ SOLN: 4.0000 mg | Freq: Four times a day (QID) | INTRAMUSCULAR | Status: DC | PRN

## 2022-04-10 MED ORDER — LACTATED RINGERS IV SOLN: INTRAVENOUS | Status: AC

## 2022-04-10 MED ORDER — OSELTAMIVIR PHOSPHATE 75 MG PO CAPS: 75.0000 mg | ORAL_CAPSULE | Freq: Two times a day (BID) | ORAL | Status: DC

## 2022-04-10 MED ORDER — OSELTAMIVIR PHOSPHATE 75 MG PO CAPS
75.0000 mg | ORAL_CAPSULE | Freq: Once | ORAL | Status: DC
  Filled 2022-04-10: qty 1

## 2022-04-10 MED ORDER — CEPHALEXIN 500 MG PO CAPS
500.0000 mg | ORAL_CAPSULE | Freq: Three times a day (TID) | ORAL | Status: AC
  Administered 2022-04-10 – 2022-04-14 (×14): 500 mg via ORAL
  Filled 2022-04-10 (×14): qty 1

## 2022-04-10 MED ORDER — METHYLPREDNISOLONE SODIUM SUCC 125 MG IJ SOLR
125.0000 mg | Freq: Once | INTRAMUSCULAR | Status: AC
  Administered 2022-04-10: 125 mg via INTRAVENOUS
  Filled 2022-04-10: qty 2

## 2022-04-10 MED ORDER — PREDNISONE 20 MG PO TABS
40.0000 mg | ORAL_TABLET | Freq: Every day | ORAL | Status: AC
  Administered 2022-04-11 – 2022-04-14 (×4): 40 mg via ORAL
  Filled 2022-04-10 (×4): qty 2

## 2022-04-10 MED ORDER — ACETAMINOPHEN 650 MG RE SUPP: 650.0000 mg | Freq: Four times a day (QID) | RECTAL | Status: DC | PRN

## 2022-04-10 MED ORDER — ONDANSETRON HCL 4 MG PO TABS: 4.0000 mg | ORAL_TABLET | Freq: Four times a day (QID) | ORAL | Status: DC | PRN

## 2022-04-10 MED ORDER — LACTATED RINGERS IV BOLUS
1000.0000 mL | Freq: Once | INTRAVENOUS | Status: AC
  Administered 2022-04-10: 1000 mL via INTRAVENOUS

## 2022-04-10 MED ORDER — IPRATROPIUM-ALBUTEROL 0.5-2.5 (3) MG/3ML IN SOLN
3.0000 mL | Freq: Four times a day (QID) | RESPIRATORY_TRACT | Status: DC
  Administered 2022-04-10: 3 mL via RESPIRATORY_TRACT
  Filled 2022-04-10: qty 3

## 2022-04-10 MED ORDER — SODIUM CHLORIDE 0.9 % IV SOLN: Freq: Once | INTRAVENOUS | Status: AC

## 2022-04-10 MED ORDER — ACETAMINOPHEN 325 MG PO TABS
650.0000 mg | ORAL_TABLET | Freq: Four times a day (QID) | ORAL | Status: DC | PRN
  Administered 2022-04-12: 650 mg via ORAL
  Filled 2022-04-10: qty 2

## 2022-04-10 MED ORDER — GUAIFENESIN-DM 100-10 MG/5ML PO SYRP
5.0000 mL | ORAL_SOLUTION | ORAL | Status: DC | PRN
  Administered 2022-04-10 – 2022-04-14 (×3): 5 mL via ORAL
  Filled 2022-04-10 (×3): qty 10

## 2022-04-10 MED ORDER — IPRATROPIUM-ALBUTEROL 0.5-2.5 (3) MG/3ML IN SOLN
3.0000 mL | Freq: Three times a day (TID) | RESPIRATORY_TRACT | Status: DC
  Administered 2022-04-11 – 2022-04-15 (×13): 3 mL via RESPIRATORY_TRACT
  Filled 2022-04-10 (×13): qty 3

## 2022-04-10 MED ORDER — ENOXAPARIN SODIUM 40 MG/0.4ML IJ SOSY
40.0000 mg | PREFILLED_SYRINGE | INTRAMUSCULAR | Status: DC
  Administered 2022-04-10 – 2022-04-14 (×5): 40 mg via SUBCUTANEOUS
  Filled 2022-04-10 (×5): qty 0.4

## 2022-04-10 MED ORDER — POLYETHYLENE GLYCOL 3350 17 G PO PACK: 17.0000 g | PACK | Freq: Every day | ORAL | Status: DC | PRN

## 2022-04-10 MED ORDER — OSELTAMIVIR PHOSPHATE 30 MG PO CAPS
30.0000 mg | ORAL_CAPSULE | Freq: Two times a day (BID) | ORAL | Status: DC
  Filled 2022-04-10: qty 1

## 2022-04-10 MED ORDER — OSELTAMIVIR PHOSPHATE 30 MG PO CAPS
30.0000 mg | ORAL_CAPSULE | Freq: Two times a day (BID) | ORAL | Status: AC
  Administered 2022-04-11 – 2022-04-15 (×9): 30 mg via ORAL
  Filled 2022-04-10 (×9): qty 1

## 2022-04-10 NOTE — Assessment & Plan Note (Signed)
Patient has a history of hypertension but is not currently on any antihypertensives.  Elevated at this time, likely due to acute stress and pain.  - Monitor blood pressure and will use PRNs if SBP above 180 or DBP above 110

## 2022-04-10 NOTE — Assessment & Plan Note (Signed)
Per patient's daughter at bedside, patient has some cognitive dysfunction, however she appears more disoriented currently.  She is only oriented to person and place.  Likely due to acute illness as outlined above.  - Delirium precautions

## 2022-04-10 NOTE — ED Notes (Signed)
Waiting on covid swabs from Lab

## 2022-04-10 NOTE — ED Notes (Signed)
Family at bedside. Pt given warm blankets and extra pillow between legs. Family updated on pt care and plan at this time.

## 2022-04-10 NOTE — ED Triage Notes (Addendum)
Pt comes with c/o unwitnessed fall. Pt is from [facility] and was found on the floor by staff at [facility]. Unknown how long pt was down. Pt was seen recently for CHF exacerbation and possible UTI. Pt is A*OX4. Pt does have wheezing and was given 1 albuterol by EMs.  Pt also has some shortening and rotation to left leg per EMs.  Pt states unknown if she hit her head. Unknown if on blood thinners.

## 2022-04-10 NOTE — Assessment & Plan Note (Signed)
Patient presenting with increased cough, shortness of breathing and wheezing over the past 3 days with PCR positive for influenza B.  - Continuous pulse oximetry with goal oxygen saturation above 88% - S/p Solu-Medrol 125 mg once - Start prednisone 40 mg tomorrow to complete a 5-day course - Start Tamiflu - DuoNebs every 6 hours - Continue home bronchodilators

## 2022-04-10 NOTE — Assessment & Plan Note (Signed)
In the setting of hypovolemia due to poor p.o. intake in the setting of viral illness.  History of chronic hyponatremia.  - IV fluids as ordered - Repeat BMP in the a.m.

## 2022-04-10 NOTE — Assessment & Plan Note (Signed)
Continue home levothyroxine 

## 2022-04-10 NOTE — Assessment & Plan Note (Signed)
Patient brought to the ED after being found on the ground this morning by staff at [facility], with assumption that patient rolled out of bed.  Initial concern for left hip fracture, however x-ray and CT are negative.  Increased fall risk at this time in the setting of hyponatremia, UTI and COPD with acute exacerbation.  -PT/OT

## 2022-04-10 NOTE — Progress Notes (Signed)
PT Cancellation Note  Patient Details Name: Monica Singh MRN: [id number] DOB: [birth year]   Cancelled Treatment:    Reason Eval/Treat Not Completed: Patient not medically ready. Patient here with fall, imaging not read yet. Will evaluate at later time when results available.     Crystol Walpole 04/10/2022, 2:35 PM

## 2022-04-10 NOTE — H&P (Signed)
History and Physical    Patient: Monica Singh DOB: 27-Oct-1928 DOA: 04/10/2022 DOS: the patient was seen and examined on 04/10/2022 PCP: Birdie Sons, MD  Patient coming from: [facility]   Chief Complaint:  Chief Complaint  Patient presents with   Fall   HPI: Monica Singh is a 89 y.o. female with medical history significant of change to hypertension, hypothyroidism, COPD, recurrent UTI, osteoporosis, diffuse large cell lymphoma in remission, mitral regurgitation, who presents to the ED due to a ground-level fall.  History obtained through chart review and from patient's daughter at bedside due to patient's underlying altered mental status.  Patient's daughter states that patient has been feeling unwell since 04/07/2022 with increasing nonproductive cough.  Symptoms seem to worsen on 04/08/2022 to include shortness of breath, wheezing.  In addition, patient endorsed to her family that she was experiencing dysuria.  She followed up with her PCP yesterday at which time, she was started on Keflex for UTI given urinalysis demonstrated leukocytes.  She was also started on prednisone and azithromycin for suspected COPD exacerbation.  At this time, [name] denies any complaints including chest pain, shortness of breath, nausea, vomiting, abdominal pain or left hip pain.  ED course: On arrival to the ED, patient was hypertensive at 176/90 with heart rate of 105.  She was saturating at 91% on room air.  She was tachypneic up to 23.  Initial workup notable for WBC of 11.6, hemoglobin of 13.4, sodium of 125, potassium 3.4, bicarb 25, glucose 139, creatinine 0.91 with GFR of 60.  CK within normal limits at 53.  Influenza B PCR positive, COVID-19 and RSV PCR negative.  CT of the head, C-spine, and left hip were obtained with no acute abnormalities.  Chest x-ray, femur x-ray and pelvis [place] ray were obtained with no acute abnormalities.  Patient started on IV fluids and  [facility] contacted for admission.  Review of Systems: Unable to obtain due to patient's altered mental status.  Past Medical History:  Diagnosis Date   Actinic keratosis    Adnexal mass 06/03/2014   since 2009   Arthritis    Chronic kidney disease    Complication of anesthesia    COPD (chronic obstructive pulmonary disease) ([place])    Diffuse large cell lymphoma in remission ([place]) 01/2007   NON-HODGKINS-stage 3, cd 20 positive; status post 6 cycles of R-CHOP   Dyspnea    with exertion   Essential hypertension 04/10/2022   Herpes zoster without complication [id number]   History of hiatal hernia    HOH (hard of hearing)    wears hearing aides   Mitral regurgitation    Non Hodgkin's lymphoma ([place])    Personal history of chemotherapy    PONV (postoperative nausea and vomiting)    in January lasted about 4 hours   Recurrent UTI    Squamous cell carcinoma of skin 11/09/2008   Right post. lat. elbow. SCCis arising in [place]. Excised 01/04/2009, margins free.    Squamous cell carcinoma of skin 07/26/2020   right hand   Syncope    Past Surgical History:  Procedure Laterality Date   ABDOMINAL SURGERY  2009   abdominal mass+ NH lymphoma,   BACK SURGERY  01/2017   fusion. metal plate in neck at back   CATARACT EXTRACTION  1997   right eye and left ey   cervical neck fusion  [place] Left 01/30/2018   Procedure: CYSTOSCOPY WITH RETROGRADE PYELOGRAM;  Surgeon: Billey Co, MD;  Location: [facility];  Service: Urology;  Laterality: Left;   CYSTOSCOPY W/ RETROGRADES Left 08/05/2018   Procedure: CYSTOSCOPY WITH RETROGRADE PYELOGRAM;  Surgeon: Billey Co, MD;  Location: [facility];  Service: Urology;  Laterality: Left;   CYSTOSCOPY W/ RETROGRADES Left 06/01/2021   Procedure: CYSTOSCOPY WITH RETROGRADE PYELOGRAM;  Surgeon: Billey Co, MD;  Location: [facility];  Service: Urology;  Laterality: Left;   CYSTOSCOPY W/ URETERAL STENT PLACEMENT Left 08/05/2018   Procedure:  CYSTOSCOPY WITH STENT Exchange;  Surgeon: Billey Co, MD;  Location: [facility];  Service: Urology;  Laterality: Left;   CYSTOSCOPY W/ URETERAL STENT PLACEMENT Left 05/21/2019   Procedure: CYSTOSCOPY WITH STENT REPLACEMENT;  Surgeon: Billey Co, MD;  Location: [facility];  Service: Urology;  Laterality: Left;   CYSTOSCOPY W/ URETERAL STENT PLACEMENT Left 05/05/2020   Procedure: CYSTOSCOPY WITH RETROGRADE PYELOGRAM/URETERAL STENT EXCHANGE;  Surgeon: Billey Co, MD;  Location: [facility];  Service: Urology;  Laterality: Left;   CYSTOSCOPY W/ URETERAL STENT PLACEMENT Left 06/01/2021   Procedure: CYSTOSCOPY WITH STENT EXCHANGE;  Surgeon: Billey Co, MD;  Location: [facility];  Service: Urology;  Laterality: Left;   CYSTOSCOPY WITH BIOPSY Left 02/27/2018   Procedure: CYSTOSCOPY WITH BIOPSY;  Surgeon: Billey Co, MD;  Location: [facility];  Service: Urology;  Laterality: Left;   CYSTOSCOPY WITH STENT PLACEMENT Left 01/30/2018   Procedure: CYSTOSCOPY WITH STENT PLACEMENT;  Surgeon: Billey Co, MD;  Location: [facility];  Service: Urology;  Laterality: Left;   CYSTOSCOPY WITH URETEROSCOPY AND STENT PLACEMENT Left 02/27/2018   Procedure: CYSTOSCOPY WITH URETEROSCOPY AND STENT Exchange;  Surgeon: Billey Co, MD;  Location: [facility];  Service: Urology;  Laterality: Left;   DENTAL SURGERY     screws   JOINT REPLACEMENT Left    knee   KNEE ARTHROPLASTY Right 12/23/2018   Procedure: RIGHT COMPUTER ASSISTED TOTAL KNEE ARTHROPLASTY;  Surgeon: Dereck Leep, MD;  Location: [facility];  Service: Orthopedics;  Laterality: Right;   KYPHOPLASTY N/A 02/21/2017   Procedure: [id number];  Surgeon: Hessie Knows, MD;  Location: [facility];  Service: Orthopedics;  Laterality: N/A;   laparotomy with biopsy  03/02/2007   PORTACATH PLACEMENT  2009   [place]   SQUAMOUS CELL CARCINOMA EXCISION     right arm   TOTAL KNEE ARTHROPLASTY Left    2009   URETEROSCOPY Left 01/30/2018   Procedure:  URETEROSCOPY;  Surgeon: Billey Co, MD;  Location: [facility];  Service: Urology;  Laterality: Left;   VAGINAL HYSTERECTOMY  1971   Social History:  reports that she quit smoking about 32 years ago. Her smoking use included cigarettes. She smoked an average of .25 packs per day. She has been exposed to tobacco smoke. She has never used smokeless tobacco. She reports that she does not currently use alcohol. She reports that she does not use drugs.  Allergies  Allergen Reactions   Ciprofloxacin Itching    Whelps on face reported 03/16/2021   Milk-Related Compounds Diarrhea and Nausea And Vomiting    Gas too   Latex Itching   Misc. Sulfonamide Containing Compounds    Milk ([place]) Diarrhea and Nausea And Vomiting    Milk allergy    Sulfa Antibiotics Hives and Itching    Family History  Problem Relation Age of Onset   Breast cancer Sister    Dementia Sister    Cataracts Sister    Heart attack  Brother    CAD Brother    Heart attack Brother    Leukemia Grandchild        granddaughter   Kidney disease Neg Hx    Bladder Cancer Neg Hx     Prior to Admission medications   Medication Sig Start Date End Date Taking? Authorizing Provider  cephALEXin (KEFLEX) 500 MG capsule Take 1 capsule (500 mg total) by mouth 3 (three) times daily for 5 days. 04/09/22 04/14/22 Yes Bacigalupo, Dionne Bucy, MD  COMBIVENT RESPIMAT 20-100 MCG/ACT AERS respimat INHALE 2  PUFFS INTO THE LUNGS 3 TIMES DAILY 11/29/21  Yes Birdie Sons, MD  Cyanocobalamin (VITAMIN B-12 ER PO) Take by mouth.   Yes [provider]  Dextromethorphan-Menthol (DELSYM COUGH RELIEF MT) Use as directed in the mouth or throat.   Yes [provider]  FOLIC ACID PO Take by mouth.   Yes [provider]  ibuprofen (ADVIL) 200 MG tablet Take 400 mg by mouth every 6 (six) hours as needed.   Yes [provider]  methenamine (HIPREX) 1 g tablet Take 1 tablet (1 g total) by mouth 2 (two) times daily with a meal.  02/10/22  Yes McGowan, Larene Beach A, [name]  predniSONE (DELTASONE) 20 MG tablet Take 2 tablets (40 mg total) by mouth daily with breakfast for 7 days. 04/09/22 04/16/22 Yes Bacigalupo, Dionne Bucy, MD  Probiotic Product (ALIGN) 4 MG CAPS Take 1 capsule (4 mg total) by mouth daily. 12/04/21  Yes Birdie Sons, MD  psyllium (HYDROCIL/METAMUCIL) 95 % PACK Take 1 packet by mouth daily.   Yes [provider]  azithromycin (ZITHROMAX) 250 MG tablet Take '500mg'$  PO daily x1d and then '250mg'$  daily x4 days Patient not taking: Reported on 04/10/2022 04/09/22   [name] Crews, MD    Physical Exam: Vitals:   04/10/22 1030 04/10/22 1130 04/10/22 1200 04/10/22 1240  BP:  (!) 163/89 (!) 160/90   Pulse: (!) 101 100 (!) 101   Resp: (!) 24 19 (!) 22   Temp:    98.2 F (36.8 C)  TempSrc:    Oral  SpO2: 94% 98% 95%    Physical Exam Vitals and nursing note reviewed.  Constitutional:      General: She is not in acute distress.    Appearance: She is normal weight. She is not toxic-appearing.  HENT:     Head: Normocephalic and atraumatic.     Mouth/Throat:     Mouth: Mucous membranes are dry.     Pharynx: Oropharynx is clear.  Eyes:     Conjunctiva/sclera: Conjunctivae normal.     Pupils: Pupils are equal, round, and reactive to light.  Cardiovascular:     Rate and Rhythm: Regular rhythm. Tachycardia present.     Heart sounds: No murmur heard. Pulmonary:     Effort: Pulmonary effort is normal. No respiratory distress.     Breath sounds: No stridor. Wheezing (Diffuse wheezing throughout) and rhonchi (Diffuse throughout with slight improvement after coughing) present. No rales.  Abdominal:     General: Bowel sounds are normal. There is no distension.     Palpations: Abdomen is soft.     Tenderness: There is no abdominal tenderness. There is no guarding.  Musculoskeletal:        General: No tenderness.     Right hip: No tenderness or bony tenderness. Normal range of motion.     Left hip: No  tenderness or bony tenderness. Normal range of motion.  Right lower leg: No edema.     Left lower leg: No edema.  Skin:    General: Skin is warm and dry.  Neurological:     Mental Status: She is alert. She is disoriented.     Comments:  Patient is alert and oriented to self and place.  Not oriented to time or situation. No focal muscle weakness noted No facial asymmetry or Dysarthria  Psychiatric:        Mood and Affect: Mood normal.        Behavior: Behavior normal.    Data Reviewed: CBC with WBC of 11.6, hemoglobin 13.4, platelets of 362 CMP with sodium of 125, potassium 3.4, chloride 88, bicarb 25, glucose 139, BUN 17, creatinine 0.91, anion gap 12, AST 23, ALT 16 and GFR 60 CK within normal limits at 53 Influenza B PCR positive.  COVID-19, influenza A and RSV PCR negative Urinalysis still pending  EKG personally reviewed.  Sinus tachycardia with rate of 102.  Nonspecific ST changes, however no changes consistent with acute ischemia.  CT HEAD WO CONTRAST (5MM)  Result Date: 04/10/2022 CLINICAL DATA:  Head trauma, minor (Age >= 65y). EXAM: CT HEAD WITHOUT CONTRAST TECHNIQUE: Contiguous axial images were obtained from the base of the skull through the vertex without intravenous contrast. RADIATION DOSE REDUCTION: This exam was performed according to the departmental dose-optimization program which includes automated exposure control, adjustment of the mA and/or kV according to patient size and/or use of iterative reconstruction technique. COMPARISON:  CT head 09/05/2021. FINDINGS: Brain: No evidence of acute infarction, hemorrhage, hydrocephalus, extra-axial collection or mass lesion/mass effect. Patchy white matter hypodensities, compatible with chronic microvascular ischemic change. Cerebral atrophy with ex vacuo ventricular dilation. Vascular: Calcific atherosclerosis. Skull: No acute fracture. Sinuses/Orbits: Mild paranasal sinus mucosal thickening. No acute orbital findings.  Other: No mastoid effusions. IMPRESSION: No acute intracranial abnormality. Electronically Signed   By: Margaretha Sheffield M.D.   On: 04/10/2022 09:32   CT Cervical Spine Wo Contrast  Result Date: 04/10/2022 CLINICAL DATA:  Neck trauma (Age >= 65y) EXAM: CT CERVICAL SPINE WITHOUT CONTRAST TECHNIQUE: Multidetector CT imaging of the cervical spine was performed without intravenous contrast. Multiplanar CT image reconstructions were also generated. RADIATION DOSE REDUCTION: This exam was performed according to the departmental dose-optimization program which includes automated exposure control, adjustment of the mA and/or kV according to patient size and/or use of iterative reconstruction technique. COMPARISON:  CT 09/05/2021 FINDINGS: Alignment: Reversal of the cervical lordosis. Unchanged grade 1 anterolisthesis at C3-C4 and mild retrolisthesis at C5-C6. Trace anterolisthesis at C7-T1. Skull base and vertebrae: Prior ACDF at C4-C5. Intact hardware without evidence of loosening or fracture. There is no evidence of acute cervical spine fracture. Soft tissues and spinal canal: No prevertebral fluid or swelling. No visible canal hematoma. Disc levels: Unchanged moderate degenerative disc disease with grade 1 anterolisthesis at C3-C4, severe degenerative disc disease at C5-C6, and mild degenerative disc disease at C6-C7. There is multilevel facet arthropathy, severe on the left at C2-C3 and on the right at C7-T1. No high-grade spinal canal or neural foraminal narrowing. Upper chest: Negative. Other: Emphysema. IMPRESSION: No evidence of acute cervical spine fracture. Unchanged C4-C5 ACDF with multilevel degenerative changes above and below the fusion. Electronically Signed   By: Maurine Simmering M.D.   On: 04/10/2022 09:30    Results are pending, will review when available.  Assessment and Plan:  * Ground-level fall Patient brought to the ED after being found on the ground this morning by  staff at [facility], with  assumption that patient rolled out of bed.  Initial concern for left hip fracture, however x-ray and CT are negative.  Increased fall risk at this time in the setting of hyponatremia, UTI and COPD with acute exacerbation.  -PT/OT  COPD with acute exacerbation [facility]) Patient presenting with increased cough, shortness of breathing and wheezing over the past 3 days with PCR positive for influenza B.  - Continuous pulse oximetry with goal oxygen saturation above 88% - S/p Solu-Medrol 125 mg once - Start prednisone 40 mg tomorrow to complete a 5-day course - Start Tamiflu - DuoNebs every 6 hours - Continue home bronchodilators  Hyponatremia In the setting of hypovolemia due to poor p.o. intake in the setting of viral illness.  History of chronic hyponatremia.  - IV fluids as ordered - Repeat BMP in the a.m.  Frequent UTI Patient has a history of chronic UTIs with complaints of dysuria yesterday at PCP visit and urinalysis demonstrating leukocytes.  She was started on Keflex.  - Urinalysis pending - Urine culture obtained yesterday pending - Continue home Keflex  Acute encephalopathy Per patient's daughter at bedside, patient has some cognitive dysfunction, however she appears more disoriented currently.  She is only oriented to person and place.  Likely due to acute illness as outlined above.  - Delirium precautions  Essential hypertension Patient has a history of hypertension but is not currently on any antihypertensives.  Elevated at this time, likely due to acute stress and pain.  - Monitor blood pressure and will use PRNs if SBP above 180 or DBP above 110  Subclinical hypothyroidism - Continue home levothyroxine  Advance Care Planning:   Code Status: Full Code verified by patient and her daughter at bedside  Consults: None  Family Communication: Patient's daughter updated at bedside  Severity of Illness: The appropriate patient status for this patient is OBSERVATION.  Observation status is judged to be reasonable and necessary in order to provide the required intensity of service to ensure the patient's safety. The patient's presenting symptoms, physical exam findings, and initial radiographic and laboratory data in the context of their medical condition is felt to place them at decreased risk for further clinical deterioration. Furthermore, it is anticipated that the patient will be medically stable for discharge from the hospital within 2 midnights of admission.   Author: Jose Persia, MD 04/10/2022 1:18 PM  For on call review [email or web address].

## 2022-04-10 NOTE — Assessment & Plan Note (Signed)
Patient has a history of chronic UTIs with complaints of dysuria yesterday at PCP visit and urinalysis demonstrating leukocytes.  She was started on Keflex.  - Urinalysis pending - Urine culture obtained yesterday pending - Continue home Keflex

## 2022-04-10 NOTE — ED Provider Notes (Signed)
[facility] Provider Note    Event Date/Time   First MD Initiated Contact with Patient 04/10/22 [phone]     (approximate)   History   Fall   HPI  [place] H Bottone is a 89 y.o. female presents to the ER for evaluation of fall uncertain time was unwitnessed coming from home placed complaining of left hip and left leg pain.  Unknown as to how long she was down.  Reportedly has a history of CHF and COPD was given nebulizer and route.  Satting well on room air mildly hypertensive.  At rest she denies any pain but does have pain and discomfort logroll of the left side.     Physical Exam   Triage Vital Signs: ED Triage Vitals [04/10/22 0825]  Enc Vitals Group     BP (!) 186/98     Pulse Rate (!) 105     Resp (!) 24     Temp 98.5 F (36.9 C)     Temp src      SpO2 100 %     Weight      Height      Head Circumference      Peak Flow      Pain Score      Pain Loc      Pain Edu?      Excl. in [place]?     Most recent vital signs: Vitals:   04/10/22 1015 04/10/22 1030  BP:    Pulse: (!) 102 (!) 101  Resp: (!) 24 (!) 24  Temp:    SpO2: 92% 94%     Constitutional: Alert  Eyes: Conjunctivae are normal.  Head: Atraumatic. Nose: No congestion/rhinnorhea. Mouth/Throat: Mucous membranes are moist.   Neck: Painless ROM.  Cardiovascular:   Good peripheral circulation. Respiratory: Normal respiratory effort.  No retractions.  Gastrointestinal: Soft and nontender.  Musculoskeletal: Left leg is shortened with pain to logrolling left.  Compartments are soft neurovascular intact distally. Neurologic:  MAE spontaneously. No gross focal neurologic deficits are appreciated.  Skin:  Skin is warm, dry and intact. No rash noted. Psychiatric: Mood and affect are normal. Speech and behavior are normal.    ED Results / Procedures / Treatments   Labs (all labs ordered are listed, but only abnormal results are displayed) Labs Reviewed  CBC WITH  DIFFERENTIAL/PLATELET - Abnormal; Notable for the following components:      Result Value   WBC 11.6 (*)    Neutro Abs 9.4 (*)    All other components within normal limits  COMPREHENSIVE METABOLIC PANEL - Abnormal; Notable for the following components:   Sodium 125 (*)    Potassium 3.4 (*)    Chloride 88 (*)    Glucose, Bld 139 (*)    Calcium 8.7 (*)    GFR, Estimated 60 (*)    All other components within normal limits  RESP PANEL BY RT-PCR (RSV, FLU A&B, COVID)  [id number]  CK  URINALYSIS, ROUTINE W REFLEX MICROSCOPIC  SAMPLE TO BLOOD BANK     EKG  ED ECG REPORT I, Merlyn Lot, the attending physician, personally viewed and interpreted this ECG.   Date: 04/10/2022  EKG Time: 11:08  Rate: 100  Rhythm: sinus  Axis: normal  Intervals: normal  ST&T Change: non specific st abn    RADIOLOGY Please see ED Course for my review and interpretation.  I personally reviewed all radiographic images ordered to evaluate for the above acute complaints and reviewed radiology reports  and findings.  These findings were personally discussed with the patient.  Please see medical record for radiology report.    PROCEDURES:  Critical Care performed: No  Procedures   MEDICATIONS ORDERED IN ED: Medications  0.9 %  sodium chloride infusion ( Intravenous New Bag/Given 04/10/22 1056)     IMPRESSION / MDM / ASSESSMENT AND PLAN / ED COURSE  I reviewed the triage vital signs and the nursing notes.                              Differential diagnosis includes, but is not limited to, lymphedema, cellulitis, ulceration, venous stasis, DVT, electrolyte abnormality, ACS, dehydration, CHF  Patient presenting to the ER for evaluation of symptoms as described above.  Based on symptoms, risk factors and considered above differential, this presenting complaint could reflect a potentially life-threatening illness therefore the patient will be placed on continuous pulse oximetry and telemetry for  monitoring.  Laboratory evaluation will be sent to evaluate for the above complaints.       Clinical Course as of 04/10/22 1229  Wed Apr 10, 2022  [id number] CT head on my review and interpretation without evidence of SDH or IPH. [PR]  [id number] X-ray hip my review and interpretation without evidence of fracture or dislocation. [PR]  [id number] X-ray per radiology report recommending CT to further evaluate left hip pain.  Her sodium is significantly low compared to previous and reportedly has had poor p.o. intake and increased weakness and confusion.  Will give fluids anticipate she will require hospitalization [PR]  1219 CT femur without evidence of fracture.  Will discuss case in consultation with hospitalist for admission. [PR]    Clinical Course User Index [PR] Merlyn Lot, MD     FINAL CLINICAL IMPRESSION(S) / ED DIAGNOSES   Final diagnoses:  Hyponatremia  Fall, initial encounter     Rx / DC Orders   ED Discharge Orders     None        Note:  This document was prepared using Dragon voice recognition software and may include unintentional dictation errors.    Merlyn Lot, MD 04/10/22 1229

## 2022-04-10 NOTE — ED Notes (Signed)
Pt taken to Xray and CT

## 2022-04-11 DIAGNOSIS — R531 Weakness: Secondary | ICD-10-CM

## 2022-04-11 DIAGNOSIS — N189 Chronic kidney disease, unspecified: Secondary | ICD-10-CM | POA: Diagnosis present

## 2022-04-11 DIAGNOSIS — J441 Chronic obstructive pulmonary disease with (acute) exacerbation: Secondary | ICD-10-CM | POA: Diagnosis present

## 2022-04-11 DIAGNOSIS — M810 Age-related osteoporosis without current pathological fracture: Secondary | ICD-10-CM | POA: Diagnosis present

## 2022-04-11 DIAGNOSIS — E861 Hypovolemia: Secondary | ICD-10-CM | POA: Diagnosis present

## 2022-04-11 DIAGNOSIS — I340 Nonrheumatic mitral (valve) insufficiency: Secondary | ICD-10-CM | POA: Diagnosis present

## 2022-04-11 DIAGNOSIS — I509 Heart failure, unspecified: Secondary | ICD-10-CM | POA: Diagnosis present

## 2022-04-11 DIAGNOSIS — E871 Hypo-osmolality and hyponatremia: Secondary | ICD-10-CM | POA: Diagnosis present

## 2022-04-11 DIAGNOSIS — Z9221 Personal history of antineoplastic chemotherapy: Secondary | ICD-10-CM | POA: Diagnosis not present

## 2022-04-11 DIAGNOSIS — Z96653 Presence of artificial knee joint, bilateral: Secondary | ICD-10-CM | POA: Diagnosis present

## 2022-04-11 DIAGNOSIS — N390 Urinary tract infection, site not specified: Secondary | ICD-10-CM | POA: Diagnosis present

## 2022-04-11 DIAGNOSIS — Z8572 Personal history of non-Hodgkin lymphomas: Secondary | ICD-10-CM | POA: Diagnosis not present

## 2022-04-11 DIAGNOSIS — G9341 Metabolic encephalopathy: Secondary | ICD-10-CM | POA: Diagnosis present

## 2022-04-11 DIAGNOSIS — G3184 Mild cognitive impairment, so stated: Secondary | ICD-10-CM | POA: Diagnosis present

## 2022-04-11 DIAGNOSIS — W06XXXA Fall from bed, initial encounter: Secondary | ICD-10-CM | POA: Diagnosis present

## 2022-04-11 DIAGNOSIS — I130 Hypertensive heart and chronic kidney disease with heart failure and stage 1 through stage 4 chronic kidney disease, or unspecified chronic kidney disease: Secondary | ICD-10-CM | POA: Diagnosis present

## 2022-04-11 DIAGNOSIS — E038 Other specified hypothyroidism: Secondary | ICD-10-CM | POA: Diagnosis present

## 2022-04-11 DIAGNOSIS — Z85828 Personal history of other malignant neoplasm of skin: Secondary | ICD-10-CM | POA: Diagnosis not present

## 2022-04-11 DIAGNOSIS — B9620 Unspecified Escherichia coli [E. coli] as the cause of diseases classified elsewhere: Secondary | ICD-10-CM | POA: Diagnosis present

## 2022-04-11 DIAGNOSIS — Z1152 Encounter for screening for COVID-19: Secondary | ICD-10-CM | POA: Diagnosis not present

## 2022-04-11 DIAGNOSIS — Z8249 Family history of ischemic heart disease and other diseases of the circulatory system: Secondary | ICD-10-CM | POA: Diagnosis not present

## 2022-04-11 DIAGNOSIS — M25552 Pain in left hip: Secondary | ICD-10-CM | POA: Diagnosis present

## 2022-04-11 DIAGNOSIS — Z87891 Personal history of nicotine dependence: Secondary | ICD-10-CM | POA: Diagnosis not present

## 2022-04-11 DIAGNOSIS — R0902 Hypoxemia: Secondary | ICD-10-CM | POA: Diagnosis present

## 2022-04-11 DIAGNOSIS — Y92092 Bedroom in other non-institutional residence as the place of occurrence of the external cause: Secondary | ICD-10-CM | POA: Diagnosis not present

## 2022-04-11 DIAGNOSIS — W1830XA Fall on same level, unspecified, initial encounter: Secondary | ICD-10-CM | POA: Diagnosis not present

## 2022-04-11 DIAGNOSIS — Z87440 Personal history of urinary (tract) infections: Secondary | ICD-10-CM | POA: Diagnosis not present

## 2022-04-11 DIAGNOSIS — J101 Influenza due to other identified influenza virus with other respiratory manifestations: Secondary | ICD-10-CM | POA: Diagnosis present

## 2022-04-11 LAB — CBC
HCT: 36.6 % (ref 36.0–46.0)
Hemoglobin: 12.3 g/dL (ref 12.0–15.0)
MCH: 28.1 pg (ref 26.0–34.0)
MCHC: 33.6 g/dL (ref 30.0–36.0)
MCV: 83.6 fL (ref 80.0–100.0)
Platelets: 313 10*3/uL (ref 150–400)
RBC: 4.38 MIL/uL (ref 3.87–5.11)
RDW: 14.4 % (ref 11.5–15.5)
WBC: 5.6 10*3/uL (ref 4.0–10.5)
nRBC: 0 % (ref 0.0–0.2)

## 2022-04-11 LAB — BASIC METABOLIC PANEL
Anion gap: 7 (ref 5–15)
BUN: 17 mg/dL (ref 8–23)
CO2: 25 mmol/L (ref 22–32)
Calcium: 8.2 mg/dL — ABNORMAL LOW (ref 8.9–10.3)
Chloride: 92 mmol/L — ABNORMAL LOW (ref 98–111)
Creatinine, Ser: 0.81 mg/dL (ref 0.44–1.00)
GFR, Estimated: >60 mL/min (ref >60)
Glucose, Bld: 134 mg/dL — ABNORMAL HIGH (ref 70–99)
Potassium: 3.4 mmol/L — ABNORMAL LOW (ref 3.5–5.1)
Sodium: 124 mmol/L — ABNORMAL LOW (ref 135–145)

## 2022-04-11 MED ORDER — HYDRALAZINE HCL 20 MG/ML IJ SOLN: 10.0000 mg | Freq: Four times a day (QID) | INTRAMUSCULAR | Status: DC | PRN

## 2022-04-11 MED ORDER — GUAIFENESIN ER 600 MG PO TB12
600.0000 mg | ORAL_TABLET | Freq: Two times a day (BID) | ORAL | Status: DC
  Administered 2022-04-11 – 2022-04-12 (×2): 600 mg via ORAL
  Filled 2022-04-11 (×2): qty 1

## 2022-04-11 MED ORDER — SODIUM CHLORIDE 0.9 % IV SOLN: INTRAVENOUS | Status: DC

## 2022-04-11 MED ORDER — IPRATROPIUM-ALBUTEROL 20-100 MCG/ACT IN AERS
1.0000 | INHALATION_SPRAY | Freq: Four times a day (QID) | RESPIRATORY_TRACT | Status: DC | PRN
  Filled 2022-04-11: qty 4

## 2022-04-11 NOTE — Evaluation (Signed)
Occupational Therapy Evaluation Patient Details Name: Monica Singh MRN: [id number] DOB: 1929-01-20 Today's Date: 04/11/2022   History of Present Illness [place] is a 89 y.o. female with medical history significant for hypertension, hypothyroidism, COPD, recurrent UTI, osteoporosis, diffuse large cell lymphoma in remission, mitral regurgitation, who presents to the ED due to a ground-level fall.   Clinical Impression   Monica Singh was seen for OT/PT Co-evaluation this date. Prior to hospital admission, pt reports she was MOD I for ADL management, no family present to verify at time of evaluation. Per chart, pt lives at [facility]. Pt presents to acute OT demonstrating impaired ADL performance and functional mobility 2/2 decreased strength, activity tolerance and impaired balance during functional activity. (See OT problem list). Pt currently requires MIN A For bed mobility and STS t/fs from low surfaces including her bed and commode. She performs functional mobility, toileting, and standing grooming with SUPERVISION for safety. Pt would benefit from skilled OT services to address noted impairments and functional limitations (see below for any additional details) in order to maximize safety and independence while minimizing falls risk and caregiver burden. Upon hospital discharge, recommend HHOT to maximize pt safety and return to functional independence during meaningful occupations of daily life.       Recommendations for follow up therapy are one component of a multi-disciplinary discharge planning process, led by the attending physician.  Recommendations may be updated based on patient status, additional functional criteria and insurance authorization.   Follow Up Recommendations  Home health OT     Assistance Recommended at Discharge Intermittent Supervision/Assistance  Patient can return home with the following A little help with walking and/or transfers;A little help with  bathing/dressing/bathroom;Assist for transportation;Assistance with cooking/housework    Functional Status Assessment  Patient has had a recent decline in their functional status and demonstrates the ability to make significant improvements in function in a reasonable and predictable amount of time.  Equipment Recommendations  None recommended by OT    Recommendations for Other Services       Precautions / Restrictions Precautions Precautions: Fall Restrictions Weight Bearing Restrictions: No      Mobility Bed Mobility Overal bed mobility: Needs Assistance Bed Mobility: Supine to Sit     Supine to sit: Min assist     General bed mobility comments: MIN A For trunk elevation.    Transfers Overall transfer level: Needs assistance Equipment used: Rolling walker (2 wheels) Transfers: Sit to/from Stand Sit to Stand: Min assist           General transfer comment: MIN A to STS from low surfaces. Supervision for functional mobility in room.      Balance Overall balance assessment: Needs assistance Sitting-balance support: Feet supported, No upper extremity supported Sitting balance-Leahy Scale: Fair     Standing balance support: During functional activity, No upper extremity supported Standing balance-Leahy Scale: Fair Standing balance comment: Able to stand without UE support during grooming at sink.                           ADL either performed or assessed with clinical judgement   ADL Overall ADL's : Needs assistance/impaired                                       General ADL Comments: MIN A for bed mobility and STS t/fs.  Able to perform funcitonal mobility, toilet transfer, toileting, and standing HH at sink with SUPERVISION for safety.     Vision Baseline Vision/History: 1 Wears glasses Patient Visual Report: No change from baseline       Perception     Praxis      Pertinent Vitals/Pain Pain Assessment Pain Assessment:  No/denies pain     Hand Dominance Right   Extremity/Trunk Assessment Upper Extremity Assessment Upper Extremity Assessment: Generalized weakness   Lower Extremity Assessment Lower Extremity Assessment: Generalized weakness       Communication Communication Communication: No difficulties   Cognition Arousal/Alertness: Awake/alert Behavior During Therapy: WFL for tasks assessed/performed Overall Cognitive Status: No family/caregiver present to determine baseline cognitive functioning                                 General Comments: Slow processing, but able to follow VCs consistently with increased time. Provies some conflicting information re: PLOF, but overall oriented to self and place.     General Comments       Exercises Other Exercises Other Exercises: Pt educated on role of OT in acute setting, falls prevention, safe use of AE/DME for ADL management.   Shoulder Instructions      Home Living Family/patient expects to be discharged to:: Assisted living                             Home Equipment: Rolling Walker (2 wheels);Shower seat - built in;Grab bars - tub/shower;Grab bars - toilet          Prior Functioning/Environment Prior Level of Function : Independent/Modified Independent             Mobility Comments: Per pt, uses RW for functional mobility. ADLs Comments: Pt states she is able to perform dressing and toileting independently. No fmaily at bedside to confirm. From Home Place. States she lives alone in an apartment there.        OT Problem List: Decreased strength;Decreased coordination;Pain;Decreased activity tolerance;Decreased safety awareness;Decreased knowledge of use of DME or AE;Impaired balance (sitting and/or standing)      OT Treatment/Interventions: Self-care/ADL training;Therapeutic exercise;Therapeutic activities;DME and/or AE instruction;Patient/family education;Balance training;Energy  conservation;Cognitive remediation/compensation    OT Goals(Current goals can be found in the care plan section) Acute Rehab OT Goals Patient Stated Goal: To go home OT Goal Formulation: With patient Time For Goal Achievement: 04/25/22 Potential to Achieve Goals: Good ADL Goals Pt Will Perform Grooming: with modified independence;with adaptive equipment;standing;sitting Pt Will Perform Lower Body Dressing: sit to/from stand;with supervision;with set-up;with adaptive equipment Pt Will Transfer to Toilet: regular height toilet;ambulating;with modified independence Pt Will Perform Toileting - Clothing Manipulation and hygiene: with modified independence;sit to/from stand;with adaptive equipment  OT Frequency: Min 2X/week    Co-evaluation PT/OT/SLP Co-Evaluation/Treatment: Yes Reason for Co-Treatment: For patient/therapist safety;To address functional/ADL transfers PT goals addressed during session: Mobility/safety with mobility;Balance;Proper use of DME OT goals addressed during session: ADL's and self-care;Proper use of Adaptive equipment and DME      AM-PAC OT "6 Clicks" Daily Activity     Outcome Measure Help from another person eating meals?: None Help from another person taking care of personal grooming?: A Little Help from another person toileting, which includes using toliet, bedpan, or urinal?: A Little Help from another person bathing (including washing, rinsing, drying)?: A Little Help from another person to put on and taking off  regular upper body clothing?: A Little Help from another person to put on and taking off regular lower body clothing?: A Little 6 Click Score: 19   End of Session Equipment Utilized During Treatment: Gait belt;Rolling walker (2 wheels) Nurse Communication: Mobility status  Activity Tolerance: Patient tolerated treatment well;No increased pain Patient left: in chair;with call bell/phone within reach;with chair alarm set  OT Visit Diagnosis: Other  abnormalities of gait and mobility (R26.89);Muscle weakness (generalized) (M62.81)                Time: [id number] OT Time Calculation (min): 27 min Charges:  OT General Charges $OT Visit: 1 Visit OT Treatments $Self Care/Home Management : 8-22 mins  Shara Blazing, M.S., OTR/L 04/11/22, 9:57 AM

## 2022-04-11 NOTE — Progress Notes (Signed)
PROGRESS NOTE    [name]  [id number] DOB: 1928-12-21 DOA: 04/10/2022 PCP: Birdie Sons, MD   Brief Narrative: This 89 years old female with PMH significant for hypertension, hypothyroidism, COPD, recurrent UTI, osteoporosis, diffuse large cell lymphoma in remission, mitral regurgitation presented in the ED s/p fall.  Patient's daughter reports that she has been feeling sick for last 1 week with increasing non productive cough associated with shortness of breath and wheezing.  Patient has followed up with her primary care physician yesterday and she was started on Keflex for UTI given + urinalysis.  She was also started on azithromycin and prednisone for suspected COPD exacerbation. ED workup reveals WBC 11.6 CK within normal limits, Influenza B PCR positive, COVID, RSV PCR negative. CT head,  C-spine,CT left hip obtained without acute abnormalities.  Patient is admitted for further evaluation.  Assessment & Plan:   Principal Problem:   Ground-level fall Active Problems:   COPD with acute exacerbation (HCC)   Hyponatremia   Frequent UTI   Acute encephalopathy   Subclinical hypothyroidism   Essential hypertension  Generalized weakness s/p mechanical fall: Patient brought in the ED after she was being found on the ground by staff '@assisted[facility]  living facility.  With assumption that patient rolled out of bed. Initial concern was for left hip fracture however all imaging is unremarkable. She has increased fall risk in the setting of hyponatremia, UTI and COPD exacerbation. PT and OT evaluation.  COPD with acute exacerbation: Patient presented with increasing cough,  shortness of breath and wheezing for last 3 days. COVID negative, RSV negative, influenza B positive. Patient was given Solu-Medrol 125 mg IV once. Continue prednisone 40 mg daily for 5 days. Continue Tamiflu for influenza B. Continue DuoNeb nebulization scheduled and as needed.  Hyponatremia: Likely in the  setting of hypovolemia due to decreased p.o. intake in the setting of viral illness. Patient also has chronic hyponatremia. Continue IV fluid resuscitation. Recheck labs.  Frequent UTIs: Patient does have history of chronic UTIs with complaints of dysuria.   She has seen her PCP yesterday and urinanalysis showed leukocytes.  She is started on Keflex. Continue Keflex. Follow-up urine culture.  Essential hypertension.: Patient is not on any antihypertensives at home. Blood pressure is elevated could be secondary to acute pain and acute distress. Start hydralazine 10 mg IV every 6 as needed for SBP above 170.  Hypothyroidism Continue levothyroxine daily.  Acute encephalopathy: Patient has some mild cognitive dysfunction however she appears disoriented on arrival.   Continue delirium precautions. Mental status has improved and  back to baseline.   DVT prophylaxis: Lovenox Code Status: Full code Family Communication: No family at Bedside   Disposition Plan:  Status is: Observation Patient admitted for COPD exacerbation and UTI.  Requiring IV antibiotics and breathing treatments.  Consultants:  None  Procedures: None  Antimicrobials:  Anti-infectives (From admission, onward)    Start     Dose/Rate Route Frequency Ordered Stop   04/11/22 1000  oseltamivir (TAMIFLU) capsule 30 mg       See Hyperspace for full Linked Orders Report.   30 mg Oral 2 times daily 04/10/22 1823 04/15/22 2159   04/10/22 2200  oseltamivir (TAMIFLU) capsule 30 mg  Status:  Discontinued       See Hyperspace for full Linked Orders Report.   30 mg Oral 2 times daily 04/10/22 1315 04/10/22 1823   04/10/22 2200  oseltamivir (TAMIFLU) capsule 75 mg       See Hyperspace  for full Linked Orders Report.   75 mg Oral  Once 04/10/22 1823 04/10/22 2118   04/10/22 1600  cephALEXin (KEFLEX) capsule 500 mg        500 mg Oral 3 times daily 04/10/22 1319     04/10/22 1400  oseltamivir (TAMIFLU) capsule 75 mg   Status:  Discontinued       See Hyperspace for full Linked Orders Report.   75 mg Oral  Once 04/10/22 1315 04/10/22 1823   04/10/22 1315  oseltamivir (TAMIFLU) capsule 75 mg  Status:  Discontinued        75 mg Oral 2 times daily 04/10/22 1308 04/10/22 1315      Subjective: Patient seen and examined at bedside.  Overnight events noted.  Patient is very deconditioned. She seems back to her baseline mental status.  She is awake and following commands.  She moves all her extremities.  Objective: Vitals:   04/10/22 2056 04/10/22 2117 04/11/22 0402 04/11/22 0836  BP:  112/61 (!) 141/71 (!) 154/89  Pulse:  96 89 (!) 101  Resp:  18 16   Temp:  97.8 F (36.6 C) 97.9 F (36.6 C)   TempSrc:   Oral   SpO2: 94% 90% 90% 93%  Weight:      Height:        Intake/Output Summary (Last 24 hours) at 04/11/2022 1113 Last data filed at 04/11/2022 0415 Gross per 24 hour  Intake 1292.39 ml  Output 500 ml  Net 792.39 ml   Filed Weights   04/10/22 1559  Weight: 60.2 kg    Examination:  General exam: Appears calm and comfortable, deconditioned, not in any distress Respiratory system: CTA bilaterally, respiratory effort normal, RR 18. Cardiovascular system: S1 & S2 heard, regular rate and rhythm, no murmur. Gastrointestinal system: Abdomen is soft, mildly tender, nondistended, BS+ Central nervous system: Alert and oriented x 1 . No focal neurological deficits. Extremities: No edema, no cyanosis, no clubbing. Skin: No rashes, lesions or ulcers Psychiatry: Judgement and insight appear normal. Mood & affect appropriate.     Data Reviewed: I have personally reviewed following labs and imaging studies  CBC: Recent Labs  Lab 04/10/22 0837 04/11/22 0744  WBC 11.6* 5.6  NEUTROABS 9.4*  --   HGB 13.4 12.3  HCT 41.3 36.6  MCV 85.3 83.6  PLT 362 [id number]   Basic Metabolic Panel: Recent Labs  Lab 04/10/22 0837 04/11/22 0744  NA 125* 124*  K 3.4* 3.4*  CL 88* 92*  CO2 25 25  GLUCOSE 139*  134*  BUN 17 17  CREATININE 0.91 0.81  CALCIUM 8.7* 8.2*   GFR: Estimated Creatinine Clearance: 37.4 mL/min (by C-G formula based on SCr of 0.81 mg/dL). Liver Function Tests: Recent Labs  Lab 04/10/22 0837  AST 23  ALT 16  ALKPHOS 71  BILITOT 0.7  PROT 8.0  ALBUMIN 4.1   No results for input(s): "LIPASE", "AMYLASE" in the last 168 hours. No results for input(s): "AMMONIA" in the last 168 hours. Coagulation Profile: No results for input(s): "INR", "PROTIME" in the last 168 hours. Cardiac Enzymes: Recent Labs  Lab 04/10/22 0837  CKTOTAL 53   BNP (last 3 results) No results for input(s): "PROBNP" in the last 8760 hours. HbA1C: No results for input(s): "HGBA1C" in the last 72 hours. CBG: No results for input(s): "GLUCAP" in the last 168 hours. Lipid Profile: No results for input(s): "CHOL", "HDL", "LDLCALC", "TRIG", "CHOLHDL", "LDLDIRECT" in the last 72 hours. Thyroid Function Tests: No results  for input(s): "TSH", "T4TOTAL", "FREET4", "T3FREE", "THYROIDAB" in the last 72 hours. Anemia Panel: No results for input(s): "VITAMINB12", "FOLATE", "FERRITIN", "TIBC", "IRON", "RETICCTPCT" in the last 72 hours. Sepsis Labs: No results for input(s): "PROCALCITON", "LATICACIDVEN" in the last 168 hours.  Recent Results (from the past 240 hour(s))  Urine Culture     Status: Abnormal (Preliminary result)   Collection Time: 04/09/22  1:45 PM   Specimen: Urine   Urine  Result Value Ref Range Status   Urine Culture, Routine Preliminary report (A)  Preliminary   Organism ID, Bacteria Escherichia coli (A)  Preliminary    Comment: Greater than 100,000 colony forming units per mL  Resp panel by RT-PCR (RSV, Flu A&B, Covid) Anterior Nasal Swab     Status: Abnormal   Collection Time: 04/10/22 11:30 AM   Specimen: Anterior Nasal Swab  Result Value Ref Range Status   SARS Coronavirus 2 by RT PCR NEGATIVE NEGATIVE Final    Comment: (NOTE) SARS-CoV-2 target nucleic acids are NOT  DETECTED.  The SARS-CoV-2 RNA is generally detectable in upper respiratory specimens during the acute phase of infection. The lowest concentration of SARS-CoV-2 viral copies this assay can detect is 138 copies/mL. A negative result does not preclude SARS-Cov-2 infection and should not be used as the sole basis for treatment or other patient management decisions. A negative result may occur with  improper specimen collection/handling, submission of specimen other than nasopharyngeal swab, presence of viral mutation(s) within the areas targeted by this assay, and inadequate number of viral copies(<138 copies/mL). A negative result must be combined with clinical observations, patient history, and epidemiological information. The expected result is Negative.  Fact Sheet for Patients:  EntrepreneurPulse.com.au  Fact Sheet for Healthcare Providers:  IncredibleEmployment.be  This test is no t yet approved or cleared by the [place] FDA and  has been authorized for detection and/or diagnosis of SARS-CoV-2 by FDA under an Emergency Use Authorization (EUA). This EUA will remain  in effect (meaning this test can be used) for the duration of the COVID-19 declaration under Section 564(b)(1) of the Act, 21 U.S.C.section 360bbb-3(b)(1), unless the authorization is terminated  or revoked sooner.       Influenza A by PCR NEGATIVE NEGATIVE Final   Influenza B by PCR POSITIVE (A) NEGATIVE Final    Comment: (NOTE) The Xpert Xpress SARS-CoV-2/FLU/RSV plus assay is intended as an aid in the diagnosis of influenza from Nasopharyngeal swab specimens and should not be used as a sole basis for treatment. Nasal washings and aspirates are unacceptable for Xpert Xpress SARS-CoV-2/FLU/RSV testing.  Fact Sheet for Patients: EntrepreneurPulse.com.au  Fact Sheet for Healthcare Providers: IncredibleEmployment.be  This test is not  yet approved or cleared by the [place] FDA and has been authorized for detection and/or diagnosis of SARS-CoV-2 by FDA under an Emergency Use Authorization (EUA). This EUA will remain in effect (meaning this test can be used) for the duration of the COVID-19 declaration under Section 564(b)(1) of the Act, 21 U.S.C. section 360bbb-3(b)(1), unless the authorization is terminated or revoked.     Resp Syncytial Virus by PCR NEGATIVE NEGATIVE Final    Comment: (NOTE) Fact Sheet for Patients: EntrepreneurPulse.com.au  Fact Sheet for Healthcare Providers: IncredibleEmployment.be  This test is not yet approved or cleared by the [place] FDA and has been authorized for detection and/or diagnosis of SARS-CoV-2 by FDA under an Emergency Use Authorization (EUA). This EUA will remain in effect (meaning this test can be used) for the duration of  the COVID-19 declaration under Section 564(b)(1) of the Act, 21 U.S.C. section 360bbb-3(b)(1), unless the authorization is terminated or revoked.  Performed at [facility], [address] [place], [place] [address]   MRSA Next Gen by PCR, Nasal     Status: None   Collection Time: 04/10/22  9:25 PM   Specimen: Nasal Mucosa; Nasal Swab  Result Value Ref Range Status   MRSA by PCR Next Gen NOT DETECTED NOT DETECTED Final    Comment: (NOTE) The GeneXpert MRSA Assay (FDA approved for NASAL specimens only), is one component of a comprehensive MRSA colonization surveillance program. It is not intended to diagnose MRSA infection nor to guide or monitor treatment for MRSA infections. Test performance is not FDA approved in patients less than 81 years old. Performed at [facility], [address] [place], [place] [address]     Radiology Studies: CT Hip Left Wo Contrast  Result Date: 04/10/2022 CLINICAL DATA:  Unwitnessed fall. EXAM: CT OF THE LEFT HIP WITHOUT CONTRAST TECHNIQUE:  Multidetector CT imaging of the left hip was performed according to the standard protocol. Multiplanar CT image reconstructions were also generated. RADIATION DOSE REDUCTION: This exam was performed according to the departmental dose-optimization program which includes automated exposure control, adjustment of the mA and/or kV according to patient size and/or use of iterative reconstruction technique. COMPARISON:  Pelvis and left femur x-rays from same day. CT left hip dated December 08, 2020. FINDINGS: Bones/Joint/Cartilage No fracture or dislocation. Unchanged mild left hip osteoarthritis with chondrocalcinosis. No joint effusion. Ligaments Ligaments are suboptimally evaluated by CT. Muscles and Tendons Grossly intact.  Unchanged left gluteus minimus muscle atrophy. Soft tissue No fluid collection or hematoma.  No soft tissue mass. Slight interval increase in size of the left ovarian cyst, currently measuring 7.0 cm, previously 6.4 cm. Unchanged single coarse calcification posteriorly. Partially visualized left ureteral stent again noted. Aortoiliac atherosclerotic vascular disease. Sigmoid colonic diverticulosis. IMPRESSION: 1. No acute osseous abnormality. 2. Slight interval increase in size of the left ovarian cyst, currently measuring 7.0 cm, previously 6.4 cm. Given the patient's age, no routine follow-up is recommended. 3.  Aortic Atherosclerosis (ICD10-I70.0). Electronically Signed   By: Titus Dubin M.D.   On: 04/10/2022 10:58   DG FEMUR MIN 2 VIEWS LEFT  Result Date: 04/10/2022 CLINICAL DATA:  Unwitnessed fall today. Concern for left leg injury. Left leg shortening and rotation noted. EXAM: LEFT FEMUR 2 VIEWS COMPARISON:  CT left hip 12/08/2020 FINDINGS: There is diffuse decreased bone mineralization. Within this limitation, no definite left femoral acute fracture line is seen. There is mild left femoral head-neck junction circumferential degenerative osteophytosis that is similar to prior. Mild  calcifications within the superolateral aspect of the left femoroacetabular joint. Status post total left knee arthroplasty.No perihardware lucency is seen to indicate hardware failure or loosening. No knee joint effusion. Mild vascular calcifications. Partial visualization of left nephroureteral stent. IMPRESSION: 1. Within the limitation of diffuse decreased bone mineralization, no acute fracture is seen. 2. Mild left hip osteoarthritis. 3. Status post total left knee arthroplasty. Electronically Signed   By: Yvonne Kendall M.D.   On: 04/10/2022 09:39   DG Pelvis 1-2 Views  Result Date: 04/10/2022 CLINICAL DATA:  Fall EXAM: PELVIS - 1 VIEW COMPARISON:  None Available. FINDINGS: Linear sclerosis of the left femoral neck. No pelvic bone lesions are seen. Mild degenerative changes of the bilateral hips. Degenerative changes of the partially visualized lower lumbar spine. Partially visualized left ureter stent. Diffuse demineralization.  IMPRESSION: Linear sclerosis of the left femoral neck, concerning for an nondisplaced fracture. Recommend further evaluation with dedicated left hip radiograph with multiple views, alternatively, CT could be performed. Electronically Signed   By: Yetta Glassman M.D.   On: 04/10/2022 09:37   DG Chest Portable 1 View  Result Date: 04/10/2022 CLINICAL DATA:  Unwitnessed fall. EXAM: PORTABLE CHEST 1 VIEW COMPARISON:  Chest radiograph December 16, 2019. FINDINGS: Athero sclerotic calcifications of a tortuous thoracic aorta. Normal size heart. Calcifications of the mitral annulus. Hiatal hernia. Chronic senescent lung changes. No focal airspace consolidation. No visible pleural effusion or pneumothorax. No acute osseous abnormality. Partially visualized cervical fusion hardware. IMPRESSION: 1. No acute cardiopulmonary disease Electronically Signed   By: Dahlia Bailiff M.D.   On: 04/10/2022 09:36   CT HEAD WO CONTRAST (5MM)  Result Date: 04/10/2022 CLINICAL DATA:  Head trauma,  minor (Age >= 65y). EXAM: CT HEAD WITHOUT CONTRAST TECHNIQUE: Contiguous axial images were obtained from the base of the skull through the vertex without intravenous contrast. RADIATION DOSE REDUCTION: This exam was performed according to the departmental dose-optimization program which includes automated exposure control, adjustment of the mA and/or kV according to patient size and/or use of iterative reconstruction technique. COMPARISON:  CT head 09/05/2021. FINDINGS: Brain: No evidence of acute infarction, hemorrhage, hydrocephalus, extra-axial collection or mass lesion/mass effect. Patchy white matter hypodensities, compatible with chronic microvascular ischemic change. Cerebral atrophy with ex vacuo ventricular dilation. Vascular: Calcific atherosclerosis. Skull: No acute fracture. Sinuses/Orbits: Mild paranasal sinus mucosal thickening. No acute orbital findings. Other: No mastoid effusions. IMPRESSION: No acute intracranial abnormality. Electronically Signed   By: Margaretha Sheffield M.D.   On: 04/10/2022 09:32   CT Cervical Spine Wo Contrast  Result Date: 04/10/2022 CLINICAL DATA:  Neck trauma (Age >= 65y) EXAM: CT CERVICAL SPINE WITHOUT CONTRAST TECHNIQUE: Multidetector CT imaging of the cervical spine was performed without intravenous contrast. Multiplanar CT image reconstructions were also generated. RADIATION DOSE REDUCTION: This exam was performed according to the departmental dose-optimization program which includes automated exposure control, adjustment of the mA and/or kV according to patient size and/or use of iterative reconstruction technique. COMPARISON:  CT 09/05/2021 FINDINGS: Alignment: Reversal of the cervical lordosis. Unchanged grade 1 anterolisthesis at C3-C4 and mild retrolisthesis at C5-C6. Trace anterolisthesis at C7-T1. Skull base and vertebrae: Prior ACDF at C4-C5. Intact hardware without evidence of loosening or fracture. There is no evidence of acute cervical spine fracture. Soft  tissues and spinal canal: No prevertebral fluid or swelling. No visible canal hematoma. Disc levels: Unchanged moderate degenerative disc disease with grade 1 anterolisthesis at C3-C4, severe degenerative disc disease at C5-C6, and mild degenerative disc disease at C6-C7. There is multilevel facet arthropathy, severe on the left at C2-C3 and on the right at C7-T1. No high-grade spinal canal or neural foraminal narrowing. Upper chest: Negative. Other: Emphysema. IMPRESSION: No evidence of acute cervical spine fracture. Unchanged C4-C5 ACDF with multilevel degenerative changes above and below the fusion. Electronically Signed   By: Maurine Simmering M.D.   On: 04/10/2022 09:30    Scheduled Meds:  cephALEXin  500 mg Oral TID   enoxaparin (LOVENOX) injection  40 mg Subcutaneous Q24H   ipratropium-albuterol  3 mL Nebulization TID   oseltamivir  30 mg Oral BID   predniSONE  40 mg Oral Q breakfast   Continuous Infusions:   LOS: 0 days    Time spent: 50 mins    Duard Brady, MD [facility]   If 7PM-7AM, please contact  night-coverage

## 2022-04-11 NOTE — Evaluation (Signed)
Physical Therapy Evaluation Patient Details Name: Monica Singh MRN: [id number] DOB: 1928-05-18 Today's Date: 04/11/2022  History of Present Illness  Patient is a 89 year old female who presents from [facility] after fall. Medical history significant for hypertension, hypothyroidism, COPD, recurrent UTI, osteoporosis, diffuse large cell lymphoma in remission, mitral regurgitation.    Clinical Impression  Patient was agreeable to PT evaluation. She reports living at [facility] in a room alone. She reports always using the rolling walker with ambulation and reports she fell out of the bed prompting her to come to the hospital.  The patient required minimal assistance for trunk support to sit up in bed. She needed minimal assistance to stand from bed and from toilet. Supervision provided for ambulation in the room with the rolling walker.  Patient reports fatigue with activity. Recommend to continue PT while in the hospital to maximize independence and decrease caregiver burden.       Recommendations for follow up therapy are one component of a multi-disciplinary discharge planning process, led by the attending physician.  Recommendations may be updated based on patient status, additional functional criteria and insurance authorization.  Follow Up Recommendations Home health PT      Assistance Recommended at Discharge Intermittent Supervision/Assistance  Patient can return home with the following  A little help with walking and/or transfers;A little help with bathing/dressing/bathroom;Assist for transportation;Help with stairs or ramp for entrance    Equipment Recommendations None recommended by PT  Recommendations for Other Services       Functional Status Assessment Patient has had a recent decline in their functional status and demonstrates the ability to make significant improvements in function in a reasonable and predictable amount of time.     Precautions / Restrictions  Precautions Precautions: Fall Restrictions Weight Bearing Restrictions: No      Mobility  Bed Mobility Overal bed mobility: Needs Assistance Bed Mobility: Supine to Sit     Supine to sit: Min assist     General bed mobility comments: Min A for trunk elevation. cues for technique. no pain reported with mobility    Transfers Overall transfer level: Needs assistance Equipment used: Rolling walker (2 wheels) Transfers: Sit to/from Stand Sit to Stand: Min assist           General transfer comment: lifting assistance for standing from toilet and from bed. verbal cues for hand placement for safety    Ambulation/Gait Ambulation/Gait assistance: Supervision Gait Distance (Feet): 20 Feet Assistive device: Rolling walker (2 wheels) Gait Pattern/deviations: Decreased stride length Gait velocity: decreased     General Gait Details: supervision provided for ambulation in the room. slow but no loss of balance with walking. patient does report fatigue after walking  Stairs            Wheelchair Mobility    Modified Rankin (Stroke Patients Only)       Balance Overall balance assessment: Needs assistance Sitting-balance support: Feet supported, No upper extremity supported Sitting balance-Leahy Scale: Fair     Standing balance support: During functional activity, No upper extremity supported Standing balance-Leahy Scale: Fair Standing balance comment: no UE support while performing hand washing at sink                             Pertinent Vitals/Pain Pain Assessment Pain Assessment: No/denies pain    Home Living Family/patient expects to be discharged to:: Assisted living  Home Equipment: Rolling Walker (2 wheels) Additional Comments: no family in the room to confirm information.    Prior Function Prior Level of Function : Independent/Modified Independent             Mobility Comments: patient reports using a rolling  walker with all mobility ADLs Comments: Pt states she is able to perform dressing and toileting independently. No fmaily at bedside to confirm. From Home Place. States she lives alone in an apartment there.     Hand Dominance   Dominant Hand: Right    Extremity/Trunk Assessment   Upper Extremity Assessment Upper Extremity Assessment: Generalized weakness    Lower Extremity Assessment Lower Extremity Assessment: Generalized weakness       Communication   Communication: HOH  Cognition Arousal/Alertness: Awake/alert                                              General Comments      Exercises     Assessment/Plan    PT Assessment Patient needs continued PT services  PT Problem List Decreased strength;Decreased range of motion;Decreased activity tolerance;Decreased balance;Decreased mobility       PT Treatment Interventions DME instruction;Gait training;Stair training;Functional mobility training;Therapeutic activities;Therapeutic exercise;Balance training;Neuromuscular re-education;Cognitive remediation;Patient/family education    PT Goals (Current goals can be found in the Care Plan section)  Acute Rehab PT Goals Patient Stated Goal: to retutn home PT Goal Formulation: With patient Time For Goal Achievement: 04/25/22 Potential to Achieve Goals: Good    Frequency Min 2X/week     Co-evaluation PT/OT/SLP Co-Evaluation/Treatment: Yes Reason for Co-Treatment: For patient/therapist safety;To address functional/ADL transfers PT goals addressed during session: Mobility/safety with mobility OT goals addressed during session: ADL's and self-care;Proper use of Adaptive equipment and DME       AM-PAC PT "6 Clicks" Mobility  Outcome Measure Help needed turning from your back to your side while in a flat bed without using bedrails?: A Little Help needed moving from lying on your back to sitting on the side of a flat bed without using bedrails?: A  Little Help needed moving to and from a bed to a chair (including a wheelchair)?: A Little Help needed standing up from a chair using your arms (e.g., wheelchair or bedside chair)?: A Little Help needed to walk in hospital room?: A Little Help needed climbing 3-5 steps with a railing? : A Little 6 Click Score: 18    End of Session   Activity Tolerance: Patient tolerated treatment well Patient left: in chair;with call bell/phone within reach;with chair alarm set;with nursing/sitter in room Nurse Communication: Mobility status PT Visit Diagnosis: Unsteadiness on feet (R26.81);Muscle weakness (generalized) (M62.81)    Time: [id number] PT Time Calculation (min) (ACUTE ONLY): 27 min   Charges:   PT Evaluation $PT Eval Low Complexity: 1 Low PT Treatments $Therapeutic Activity: 8-22 mins        Minna Merritts, PT, MPT   Percell Locus 04/11/2022, 10:23 AM

## 2022-04-12 DIAGNOSIS — N390 Urinary tract infection, site not specified: Secondary | ICD-10-CM | POA: Diagnosis not present

## 2022-04-12 DIAGNOSIS — W1830XA Fall on same level, unspecified, initial encounter: Secondary | ICD-10-CM | POA: Diagnosis not present

## 2022-04-12 LAB — BASIC METABOLIC PANEL
Anion gap: 12 (ref 5–15)
BUN: 17 mg/dL (ref 8–23)
CO2: 27 mmol/L (ref 22–32)
Calcium: 8.3 mg/dL — ABNORMAL LOW (ref 8.9–10.3)
Chloride: 94 mmol/L — ABNORMAL LOW (ref 98–111)
Creatinine, Ser: 0.7 mg/dL (ref 0.44–1.00)
GFR, Estimated: >60 mL/min (ref >60)
Glucose, Bld: 106 mg/dL — ABNORMAL HIGH (ref 70–99)
Potassium: 3.3 mmol/L — ABNORMAL LOW (ref 3.5–5.1)
Sodium: 130 mmol/L — ABNORMAL LOW (ref 135–145)

## 2022-04-12 LAB — CBC
HCT: 32.5 % — ABNORMAL LOW (ref 36.0–46.0)
Hemoglobin: 10.9 g/dL — ABNORMAL LOW (ref 12.0–15.0)
MCH: 27.9 pg (ref 26.0–34.0)
MCHC: 33.5 g/dL (ref 30.0–36.0)
MCV: 83.3 fL (ref 80.0–100.0)
Platelets: 293 10*3/uL (ref 150–400)
RBC: 3.9 MIL/uL (ref 3.87–5.11)
RDW: 14.3 % (ref 11.5–15.5)
WBC: 5.3 10*3/uL (ref 4.0–10.5)
nRBC: 0 % (ref 0.0–0.2)

## 2022-04-12 LAB — URINE CULTURE

## 2022-04-12 LAB — MAGNESIUM: Magnesium: 1.8 mg/dL (ref 1.7–2.4)

## 2022-04-12 LAB — PHOSPHORUS: Phosphorus: 2.6 mg/dL (ref 2.5–4.6)

## 2022-04-12 MED ORDER — POTASSIUM CHLORIDE 20 MEQ PO PACK
40.0000 meq | PACK | Freq: Once | ORAL | Status: AC
  Administered 2022-04-12: 40 meq via ORAL
  Filled 2022-04-12: qty 2

## 2022-04-12 MED ORDER — GUAIFENESIN ER 600 MG PO TB12
600.0000 mg | ORAL_TABLET | Freq: Two times a day (BID) | ORAL | Status: DC
  Administered 2022-04-12 – 2022-04-15 (×6): 600 mg via ORAL
  Filled 2022-04-12 (×6): qty 1

## 2022-04-12 NOTE — Progress Notes (Signed)
Urine culture is still pending sensitivities. Can you make sure this gets to another provider while I am out to review sensitivities? Thanks!

## 2022-04-12 NOTE — Progress Notes (Signed)
Physical Therapy Treatment Patient Details Name: Monica Singh MRN: [id number] DOB: 1928/08/04 Today's Date: 04/12/2022   History of Present Illness Patient is a 89 year old female who presents from [facility] after fall. Medical history significant for hypertension, hypothyroidism, COPD, recurrent UTI, osteoporosis, diffuse large cell lymphoma in remission, mitral regurgitation    PT Comments    Pt in recliner resting on 1L/min O2, sats WNL throughout session. Pt agreeable to session- DTR from [facility] in room for session. Intervention focused on more complex gait strategies, pt moving slow and segmented, exhaustion easily provoked. Pt has no LOB in session. Problem solving and tolerance to in-room AMB not appropriate for return to home at this point. Pt AMB much shorter in distance and much slower than baseline which indicates elevated risk of falls. Pt would be well poised to received high frequency rehab services at DC to optimize quick return to home.     Recommendations for follow up therapy are one component of a multi-disciplinary discharge planning process, led by the attending physician.  Recommendations may be updated based on patient status, additional functional criteria and insurance authorization.  Follow Up Recommendations  Skilled nursing-short term rehab (<3 hours/day) Can patient physically be transported by private vehicle: Yes   Assistance Recommended at Discharge Intermittent Supervision/Assistance  Patient can return home with the following A little help with walking and/or transfers;A little help with bathing/dressing/bathroom;Assist for transportation;Help with stairs or ramp for entrance   Equipment Recommendations  None recommended by PT    Recommendations for Other Services       Precautions / Restrictions Precautions Precautions: Fall Restrictions Weight Bearing Restrictions: No     Mobility  Bed Mobility               General bed  mobility comments: in chair at entry/exit    Transfers Overall transfer level: Needs assistance Equipment used: Rolling walker (2 wheels) Transfers: Sit to/from Stand Sit to Stand: Supervision                Ambulation/Gait Ambulation/Gait assistance: Supervision, Min guard Gait Distance (Feet): 40 Feet Assistive device: Rolling walker (2 wheels)         General Gait Details: slow, pausing, feels exhausted, weak, mildlySOB without hypoxia on 1L/min   Stairs             Wheelchair Mobility    Modified Rankin (Stroke Patients Only)       Balance                                            Cognition Arousal/Alertness: Awake/alert Behavior During Therapy: WFL for tasks assessed/performed, Flat affect Overall Cognitive Status: Impaired/Different from baseline (delayed response, intermittent pausing)                                          Exercises Other Exercises Other Exercises: lateral side stepping around bed and back, 2 hand suppoort (takes about 10 minutes) Other Exercises: STS from recliner x4, cues to push off chair arm rests Other Exercises: AMB fwd/backward at bedside with RW    General Comments General comments (skin integrity, edema, etc.): Pt on 1L O2. briefly on RA foor toileting, able to maintain O2 sats WFL (>/= 92%) with functional activity.  Pertinent Vitals/Pain Pain Assessment Pain Assessment: No/denies pain    Home Living                          Prior Function            PT Goals (current goals can now be found in the care plan section) Acute Rehab PT Goals Patient Stated Goal: to retutn home PT Goal Formulation: With patient Time For Goal Achievement: 04/25/22 Potential to Achieve Goals: Fair Progress towards PT goals: Progressing toward goals    Frequency    Min 2X/week      PT Plan Discharge plan needs to be updated    Co-evaluation               AM-PAC PT "6 Clicks" Mobility   Outcome Measure  Help needed turning from your back to your side while in a flat bed without using bedrails?: A Little Help needed moving from lying on your back to sitting on the side of a flat bed without using bedrails?: A Little Help needed moving to and from a bed to a chair (including a wheelchair)?: A Little Help needed standing up from a chair using your arms (e.g., wheelchair or bedside chair)?: A Little Help needed to walk in hospital room?: A Little Help needed climbing 3-5 steps with a railing? : A Little 6 Click Score: 18    End of Session Equipment Utilized During Treatment: Gait belt;Oxygen Activity Tolerance: Patient tolerated treatment well;Patient limited by fatigue Patient left: in chair;with chair alarm set;with family/visitor present;with call bell/phone within reach Nurse Communication: Mobility status PT Visit Diagnosis: Unsteadiness on feet (R26.81);Muscle weakness (generalized) (M62.81)     Time: [id number] PT Time Calculation (min) (ACUTE ONLY): 28 min  Charges:  $Therapeutic Activity: 23-37 mins                    12:35 PM, 04/12/22 Etta Grandchild, PT, DPT Physical Therapist - [facility]  [phone] ([place])    Dagen Beevers C 04/12/2022, 12:32 PM

## 2022-04-12 NOTE — Progress Notes (Signed)
Occupational Therapy Treatment Patient Details Name: Monica Singh MRN: [id number] DOB: Dec 08, 1928 Today's Date: 04/12/2022   History of present illness Patient is a 89 year old female who presents from [facility] after fall. Medical history significant for hypertension, hypothyroidism, COPD, recurrent UTI, osteoporosis, diffuse large cell lymphoma in remission, mitral regurgitation   OT comments  Monica Singh was seen for OT treatment on this date. Upon arrival to room pt awake/alert, with supportive family at bedside. OT facilitated ADL tasks with assist and education as described below. Pt continues to be functionally limited by decreased safety awareness, decreased activity tolerance, and decreased balance during functional activity. Pt making good progress toward goals and continues to benefit from skilled OT services to maximize return to PLOF and minimize risk of future falls, injury, caregiver burden, and readmission. Will continue to follow POC as written. Extensive conversation with family at bedside Re: pt functional baseline and need for ongoing assist and cueing for safety with ADL management. Discussed benefits of STR vs. HH OT family and pt eager to see improvement in pt functional strength and safety prior to DC home. Discharge recommendation updated to STR to maximize pt safety and return to PLOF.     Recommendations for follow up therapy are one component of a multi-disciplinary discharge planning process, led by the attending physician.  Recommendations may be updated based on patient status, additional functional criteria and insurance authorization.    Follow Up Recommendations  Skilled nursing-short term rehab (<3 hours/day)     Assistance Recommended at Discharge Intermittent Supervision/Assistance  Patient can return home with the following  A little help with walking and/or transfers;A little help with bathing/dressing/bathroom;Assist for transportation;Assistance with  cooking/housework   Equipment Recommendations  None recommended by OT    Recommendations for Other Services      Precautions / Restrictions Precautions Precautions: Fall Restrictions Weight Bearing Restrictions: No       Mobility Bed Mobility Overal bed mobility: Needs Assistance Bed Mobility: Supine to Sit     Supine to sit: Min assist     General bed mobility comments: Min A for trunk elevation. cues for technique.    Transfers Overall transfer level: Needs assistance Equipment used: Rolling walker (2 wheels) Transfers: Sit to/from Stand Sit to Stand: Min assist           General transfer comment: lifting assistance for standing from toilet and from bed. verbal cues for hand placement for safety     Balance Overall balance assessment: Needs assistance Sitting-balance support: Feet supported, No upper extremity supported Sitting balance-Leahy Scale: Good Sitting balance - Comments: steady static sitting, reaching within BOS.   Standing balance support: During functional activity, No upper extremity supported Standing balance-Leahy Scale: Fair                             ADL either performed or assessed with clinical judgement   ADL Overall ADL's : Needs assistance/impaired                                       General ADL Comments: Continues to require MIN A for bed mobility and STS from low surface including commode. MIN A for peri-care after BM.  SUPERVISION for functional mobility with RW, toilet transfer, and LB dressing. Pt noted to require increased cueing for sequencing and to attend to tasks t/o  session. Significant increased time required to allow for therapeutic rest breaks, and increased time to process VCs. Per family at bedside this is far from pt's functional baseline where she is MOD I for all ADL management at home.    Extremity/Trunk Assessment Upper Extremity Assessment Upper Extremity Assessment: Generalized  weakness   Lower Extremity Assessment Lower Extremity Assessment: Generalized weakness        Vision Baseline Vision/History: 1 Wears glasses Patient Visual Report: No change from baseline     Perception     Praxis      Cognition Arousal/Alertness: Awake/alert Behavior During Therapy: WFL for tasks assessed/performed Overall Cognitive Status: Impaired/Different from baseline Area of Impairment: Following commands, Safety/judgement, Awareness, Attention                   Current Attention Level: Sustained   Following Commands: Follows one step commands with increased time, Follows multi-step commands inconsistently Safety/Judgement: Decreased awareness of deficits, Decreased awareness of safety Awareness: Emergent   General Comments: Per family at bedside pt is generally A&O x4, able to consistently follow VCs.        Exercises Other Exercises Other Exercises: OT facilitated seated UB/LB wash up, UB/LB dressing, toileting, and functional mobility as described below. Education on safety, falls prevention, and compensatory ADL management strategies provided t/o session. Extensive conversation with family at bedside for education on DC recs.    Shoulder Instructions       General Comments Pt on 1L O2. briefly on RA foor toileting, able to maintain O2 sats WFL (>/= 92%) with functional activity.    Pertinent Vitals/ Pain       Pain Assessment Pain Assessment: No/denies pain  Home Living                                          Prior Functioning/Environment              Frequency  Min 2X/week        Progress Toward Goals  OT Goals(current goals can now be found in the care plan section)  Progress towards OT goals: Progressing toward goals  Acute Rehab OT Goals Patient Stated Goal: to go home OT Goal Formulation: With patient Time For Goal Achievement: 04/25/22 Potential to Achieve Goals: Good  Plan Discharge plan needs to be  updated;Frequency remains appropriate    Co-evaluation                 AM-PAC OT "6 Clicks" Daily Activity     Outcome Measure   Help from another person eating meals?: None Help from another person taking care of personal grooming?: A Little Help from another person toileting, which includes using toliet, bedpan, or urinal?: A Little Help from another person bathing (including washing, rinsing, drying)?: A Little Help from another person to put on and taking off regular upper body clothing?: A Little Help from another person to put on and taking off regular lower body clothing?: A Little 6 Click Score: 19    End of Session Equipment Utilized During Treatment: Gait belt;Rolling walker (2 wheels)  OT Visit Diagnosis: Other abnormalities of gait and mobility (R26.89);Muscle weakness (generalized) (M62.81)   Activity Tolerance Patient tolerated treatment well;No increased pain   Patient Left in chair;with call bell/phone within reach;with chair alarm set;with family/visitor present   Nurse Communication Mobility status  [id number] OT Time Calculation (min): 57 min  Charges: OT General Charges $OT Visit: 1 Visit OT Treatments $Self Care/Home Management : 53-67 mins  Shara Blazing, M.S., OTR/L 04/12/22, 12:09 PM

## 2022-04-12 NOTE — NC FL2 (Signed)
[place] LEVEL OF CARE FORM     IDENTIFICATION  Patient Name: Monica Singh Birthdate: 13-Apr-1928 Sex: female Admission Date (Current Location): 04/10/2022  [facility] and [place] Number:  Engineering geologist and Address:  [facility], [address], [place], [place] [address]      Provider Number: [id number]  Attending Physician Name and Address:  Duard Brady, MD  Relative Name and Phone Number:  Fraser Din (Daughter) [phone]    Current Level of Care: Hospital Recommended Level of Care: [place] Prior Approval Number:    Date Approved/Denied:   PASRR Number: [id number]  Discharge Plan: SNF    Current Diagnoses: Patient Active Problem List   Diagnosis Date Noted   Generalized weakness 04/11/2022   Ground-level fall 04/10/2022   COPD with acute exacerbation ([place]) 04/10/2022   Essential hypertension 04/10/2022   Acute encephalopathy 04/10/2022   Hiatal hernia 01/05/2022   Dysuria 09/28/2021   Left wrist pain 07/23/2021   Tendinitis of left wrist 07/23/2021   Closed fracture of distal end of left radius 07/23/2021   Tenosynovitis of left wrist 07/23/2021   Bilateral carotid artery stenosis 05/27/2020   Staring episodes 12/19/2019   B12 deficiency 12/19/2019   Vitamin D deficiency 12/19/2019   Frequent UTI 12/19/2019   Syncope 12/16/2019   Total knee replacement status 12/23/2018   Obstruction of left ureteropelvic junction (UPJ) 02/26/2018   Sepsis ([place]) 02/10/2018   Closed burst fracture of lumbar vertebra with routine healing 04/01/2017   Chronic bronchitis ([place]) 02/18/2017   Age-related osteoporosis with current pathological fracture with routine healing 02/18/2017   Osteoporosis 11/20/2015   COPD (chronic obstructive pulmonary disease) ([place]) 11/20/2015   Cyst of ovary 07/26/2015   Status post total left knee replacement 04/03/2015   Abnormal chest sounds 08/25/2014   Allergic  rhinitis 08/25/2014   Colon polyp 08/25/2014   DDD (degenerative disc disease), cervical 08/25/2014   DDD (degenerative disc disease), lumbar 08/25/2014   H/O non-Hodgkin's lymphoma 08/25/2014   Hyponatremia 08/25/2014   Primary osteoarthritis of right knee 08/25/2014   Subclinical hypothyroidism 08/25/2014   Adnexal mass 06/03/2014   Osteoarthritis 03/19/2011   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   History of smoking 03/11/2008    Orientation RESPIRATION BLADDER Height & Weight     Self, Time, Situation, Place  Normal Continent Weight: 132 lb 11.2 oz (60.2 kg) Height:  5\' 3"  (160 cm)  BEHAVIORAL SYMPTOMS/MOOD NEUROLOGICAL BOWEL NUTRITION STATUS      Continent Diet (DYS 3)  AMBULATORY STATUS COMMUNICATION OF NEEDS Skin   Limited Assist Verbally Normal                       Personal Care Assistance Level of Assistance  Bathing, Feeding, Dressing Bathing Assistance: Limited assistance Feeding assistance: Limited assistance Dressing Assistance: Limited assistance     Functional Limitations Info  Sight, Hearing, Speech Sight Info: Adequate Hearing Info: Adequate Speech Info: Adequate    SPECIAL CARE FACTORS FREQUENCY  PT (By licensed PT), OT (By licensed OT)     PT Frequency: 5 Times a week OT Frequency: 5 times a week            Contractures Contractures Info: Not present    Additional Factors Info  Code Status, Allergies, Isolation Precautions Code Status Info: FULL Allergies Info: Ciprofloxacin  Milk-related Compounds  Latex  Misc. Sulfonamide Containing Compounds  Milk ([place])  Sulfa Antibiotics     Isolation Precautions  Info: Droplet Precaution     Current Medications (04/12/2022):  This is the current hospital active medication list Current Facility-Administered Medications  Medication Dose Route Frequency Provider Last Rate Last Admin   0.9 %  sodium chloride infusion   Intravenous Continuous Duard Brady, MD 75 mL/hr at 04/12/22 0941 New  Bag at 04/12/22 0941   acetaminophen (TYLENOL) tablet 650 mg  650 mg Oral Q6H PRN Jose Persia, MD       Or   acetaminophen (TYLENOL) suppository 650 mg  650 mg Rectal Q6H PRN Jose Persia, MD       cephALEXin (KEFLEX) capsule 500 mg  500 mg Oral TID Duard Brady, MD   500 mg at 04/12/22 0935   enoxaparin (LOVENOX) injection 40 mg  40 mg Subcutaneous Q24H Jose Persia, MD   40 mg at 04/11/22 2144   guaiFENesin (MUCINEX) 12 hr tablet 600 mg  600 mg Oral BID Duard Brady, MD       guaiFENesin-dextromethorphan (ROBITUSSIN DM) 100-10 MG/5ML syrup 5 mL  5 mL Oral Q4H PRN Jose Persia, MD   5 mL at 04/11/22 1705   hydrALAZINE (APRESOLINE) injection 10 mg  10 mg Intravenous Q6H PRN Duard Brady, MD       Ipratropium-Albuterol (COMBIVENT) respimat 1 puff  1 puff Inhalation Q6H PRN Duard Brady, MD       ipratropium-albuterol (DUONEB) 0.5-2.5 (3) MG/3ML nebulizer solution 3 mL  3 mL Nebulization TID Jose Persia, MD   3 mL at 04/12/22 1406   ondansetron (ZOFRAN) tablet 4 mg  4 mg Oral Q6H PRN Jose Persia, MD       Or   ondansetron (ZOFRAN) injection 4 mg  4 mg Intravenous Q6H PRN Jose Persia, MD       oseltamivir (TAMIFLU) capsule 30 mg  30 mg Oral BID Dorothe Pea, RPH   30 mg at 04/12/22 0934   polyethylene glycol (MIRALAX / GLYCOLAX) packet 17 g  17 g Oral Daily PRN Jose Persia, MD       predniSONE (DELTASONE) tablet 40 mg  40 mg Oral Q breakfast Jose Persia, MD   40 mg at 04/12/22 [id number]     Discharge Medications: Please see discharge summary for a list of discharge medications.  Relevant Imaging Results:  Relevant Lab Results:   Additional Information [id number]  Gerilyn Pilgrim, LCSW

## 2022-04-12 NOTE — TOC Progression Note (Signed)
Transition of Care [facility]) - Progression Note    Patient Details  Name: Monica Singh MRN: [id number] Date of Birth: 12-10-1928  Transition of [facility]) CM/SW Contact  Gerilyn Pilgrim, LCSW Phone Number: 04/12/2022, 3:49 PM  Clinical Narrative:   Josem Kaufmann started for [employer].          Expected Discharge Plan and Services                                               Social Determinants of Health (SDOH) Interventions SDOH Screenings   Food Insecurity: No Food Insecurity (04/10/2022)  Housing: Low Risk  (04/10/2022)  Transportation Needs: No Transportation Needs (04/10/2022)  Utilities: Not At Risk (04/10/2022)  Alcohol Screen: Low Risk  (04/09/2022)  Depression (PHQ2-9): Low Risk  (04/09/2022)  Financial Resource Strain: Low Risk  (08/15/2021)  Physical Activity: Insufficiently Active (08/15/2021)  Social Connections: Socially Isolated (08/15/2021)  Stress: No Stress Concern Present (08/15/2021)  Tobacco Use: Medium Risk (04/10/2022)    Readmission Risk Interventions     No data to display

## 2022-04-12 NOTE — Progress Notes (Signed)
Mobility Specialist - Progress Note   Pre-mobility: SpO2(94)    04/12/22 1512  Mobility  Activity Ambulated with assistance in hallway  Level of Assistance Contact guard assist, steadying assist  Assistive Device Front wheel walker  Distance Ambulated (ft) 160 ft  Activity Response Tolerated well  Mobility Referral Yes  $Mobility charge 1 Mobility   Pt resting in bed on 1L upon entry. Pt STS and ambulates to hallway around NS for 1 lap with AD. Pt returned to recliner and left with needs in reach and chair alarm activated. Pt daughters and RN present.   Loma Sender Mobility Specialist 04/12/22, 3:26 PM

## 2022-04-12 NOTE — Progress Notes (Signed)
Mobility Specialist - Progress Note     04/12/22 1530  Mobility  Activity Ambulated with assistance to bathroom  Level of Assistance Standby assist, set-up cues, supervision of patient - no hands on  Assistive Device Front wheel walker  Distance Ambulated (ft) 10 ft  Activity Response Tolerated well  Mobility Referral Yes  $Mobility charge 1 Mobility   Pt resting in recliner on 1L upon entry. Pt STS and ambulates to bathroom SBA with AD. Pt returned to recliner and left with needs in reach. Chair alarm activated.   Loma Sender Mobility Specialist 04/12/22, 3:37 PM

## 2022-04-12 NOTE — Progress Notes (Signed)
PROGRESS NOTE    [name]  [id number] DOB: [birth year] DOA: 04/10/2022  PCP: Birdie Sons, MD   Brief Narrative: This 89 years old female with PMH significant for hypertension, hypothyroidism, COPD, recurrent UTI, osteoporosis, diffuse large cell lymphoma in remission, mitral regurgitation presented in the ED s/p fall.  Patient's daughter reports that she has been feeling sick for last 1 week with increasing non productive cough associated with shortness of breath and wheezing.  Patient has followed up with her primary care physician yesterday and she was started on Keflex for UTI given + urinalysis.  She was also started on azithromycin and prednisone for suspected COPD exacerbation. ED workup reveals WBC 11.6K, CK within normal limits, Influenza B PCR positive, COVID, RSV PCR negative. CT head,  C-spine,CT left hip obtained without acute abnormalities.  Patient is admitted for further evaluation.  Assessment & Plan:   Principal Problem:   Ground-level fall Active Problems:   COPD with acute exacerbation (HCC)   Hyponatremia   Frequent UTI   Acute encephalopathy   Subclinical hypothyroidism   Essential hypertension   Generalized weakness  Generalized weakness s/p mechanical fall: Patient brought in the ED after she was being found on the ground by staff @assisted  living facility.  With assumption that patient rolled out of bed. Initial concern was for left hip fracture however all imaging is unremarkable. She has increased fall risk in the setting of chronic hyponatremia, UTI and COPD exacerbation. PT and OT evaluation> HHS   COPD with acute exacerbation: Acute hypoxic respiratory failure: Patient presented with increasing cough,  shortness of breath and wheezing for last 3 days. COVID negative, RSV negative, Influenza B positive. Patient was given Solu-Medrol 125 mg IV once. Continue prednisone 40 mg daily for 4 days. Continue Tamiflu for influenza B. Continue  DuoNeb nebulization scheduled and as needed. Continue supplemental oxygen and wean as tolerated. Check oxygen while ambulation to see if She qualifies for home oxygen.  Hyponatremia: Likely in the setting of hypovolemia due to decreased p.o. intake in the setting of viral illness. Patient also has chronic hyponatremia. Continue IV fluid resuscitation. Recheck labs showed improved sodium level.  Frequent UTIs: Patient does have history of chronic UTIs with complaints of dysuria.   She has seen her PCP yesterday and urinanalysis showed leukocytes.  She is started on Keflex. Continue Keflex. Follow-up urine culture.  Essential hypertension.: Patient is not on any antihypertensives at home. Blood pressure is elevated could be secondary to acute pain and acute distress. Continue hydralazine 10 mg IV every 6 as needed for SBP above 170.  Subclinical Hypothyroidism Patient not taking levothyroxine.  Acute encephalopathy: Patient has some mild cognitive dysfunction however she appears disoriented on arrival.   Continue delirium precautions. Mental status has improved and  back to baseline.   DVT prophylaxis: Lovenox Code Status: Full code Family Communication: No family at Bedside   Disposition Plan:  Status is: Observation Patient admitted for COPD exacerbation and UTI.  She is requiring IV antibiotics and breathing treatments. Anticipated discharge back to assisted living facility 04/13/2022.  Consultants:  None  Procedures: None  Antimicrobials:  Anti-infectives (From admission, onward)    Start     Dose/Rate Route Frequency Ordered Stop   04/11/22 1000  oseltamivir (TAMIFLU) capsule 30 mg       See Hyperspace for full Linked Orders Report.   30 mg Oral 2 times daily 04/10/22 1823 04/15/22 2159   04/10/22 2200  oseltamivir (TAMIFLU) capsule 30 mg  Status:  Discontinued       See Hyperspace for full Linked Orders Report.   30 mg Oral 2 times daily 04/10/22 1315 04/10/22  1823   04/10/22 2200  oseltamivir (TAMIFLU) capsule 75 mg       See Hyperspace for full Linked Orders Report.   75 mg Oral  Once 04/10/22 1823 04/10/22 2118   04/10/22 1600  cephALEXin (KEFLEX) capsule 500 mg        500 mg Oral 3 times daily 04/10/22 1319     04/10/22 1400  oseltamivir (TAMIFLU) capsule 75 mg  Status:  Discontinued       See Hyperspace for full Linked Orders Report.   75 mg Oral  Once 04/10/22 1315 04/10/22 1823   04/10/22 1315  oseltamivir (TAMIFLU) capsule 75 mg  Status:  Discontinued        75 mg Oral 2 times daily 04/10/22 1308 04/10/22 1315      Subjective: Patient seen and examined at bedside.  Overnight events noted.  Patient is very deconditioned. She seems back to her baseline mental status.  She is awake and following commands.  She moves all her extremities.  She remains on 2 dose of supplemental oxygen.  Objective: Vitals:   04/12/22 0300 04/12/22 0452 04/12/22 0739 04/12/22 0802  BP:  (!) 145/82  (!) 150/79  Pulse:  94 95 96  Resp:  18 16 18   Temp: 98.4 F (36.9 C) 98.3 F (36.8 C)  98.3 F (36.8 C)  TempSrc:  Oral    SpO2:  (!) 88% (!) 85% 92%  Weight:      Height:        Intake/Output Summary (Last 24 hours) at 04/12/2022 1031 Last data filed at 04/11/2022 1501 Gross per 24 hour  Intake 18.43 ml  Output --  Net 18.43 ml   Filed Weights   04/10/22 1559  Weight: 60.2 kg    Examination:  General exam: Appears comfortable, not in any acute distress, deconditioned. Respiratory system: CTA bilaterally, respiratory effort normal, RR 14. Cardiovascular system: S1 & S2 heard, regular rate and rhythm, no murmur. Gastrointestinal system: Abdomen is soft, mildly tender, non distended, BS+ Central nervous system: Alert and oriented x 1 . No focal neurological deficits. Extremities: No edema, no cyanosis, no clubbing. Skin: No rashes, lesions or ulcers Psychiatry:  Mood & affect appropriate.     Data Reviewed: I have personally reviewed  following labs and imaging studies  CBC: Recent Labs  Lab 04/10/22 0837 04/11/22 0744 04/12/22 0642  WBC 11.6* 5.6 5.3  NEUTROABS 9.4*  --   --   HGB 13.4 12.3 10.9*  HCT 41.3 36.6 32.5*  MCV 85.3 83.6 83.3  PLT 362 313 0000000   Basic Metabolic Panel: Recent Labs  Lab 04/10/22 0837 04/11/22 0744 04/12/22 0642  NA 125* 124* 130*  K 3.4* 3.4* 3.3*  CL 88* 92* 94*  CO2 25 25 27   GLUCOSE 139* 134* 106*  BUN 17 17 17   CREATININE 0.91 0.81 0.70  CALCIUM 8.7* 8.2* 8.3*  MG  --   --  1.8  PHOS  --   --  2.6   GFR: Estimated Creatinine Clearance: 37.9 mL/min (by C-G formula based on SCr of 0.7 mg/dL). Liver Function Tests: Recent Labs  Lab 04/10/22 0837  AST 23  ALT 16  ALKPHOS 71  BILITOT 0.7  PROT 8.0  ALBUMIN 4.1   No results for input(s): "LIPASE", "AMYLASE" in the last 168 hours. No results  for input(s): "AMMONIA" in the last 168 hours. Coagulation Profile: No results for input(s): "INR", "PROTIME" in the last 168 hours. Cardiac Enzymes: Recent Labs  Lab 04/10/22 0837  CKTOTAL 53   BNP (last 3 results) No results for input(s): "PROBNP" in the last 8760 hours. HbA1C: No results for input(s): "HGBA1C" in the last 72 hours. CBG: No results for input(s): "GLUCAP" in the last 168 hours. Lipid Profile: No results for input(s): "CHOL", "HDL", "LDLCALC", "TRIG", "CHOLHDL", "LDLDIRECT" in the last 72 hours. Thyroid Function Tests: No results for input(s): "TSH", "T4TOTAL", "FREET4", "T3FREE", "THYROIDAB" in the last 72 hours. Anemia Panel: No results for input(s): "VITAMINB12", "FOLATE", "FERRITIN", "TIBC", "IRON", "RETICCTPCT" in the last 72 hours. Sepsis Labs: No results for input(s): "PROCALCITON", "LATICACIDVEN" in the last 168 hours.  Recent Results (from the past 240 hour(s))  Urine Culture     Status: Abnormal (Preliminary result)   Collection Time: 04/09/22  1:45 PM   Specimen: Urine   Urine  Result Value Ref Range Status   Urine Culture, Routine  Preliminary report (A)  Preliminary   Organism ID, Bacteria Escherichia coli (A)  Preliminary    Comment: Greater than 100,000 colony forming units per mL  Resp panel by RT-PCR (RSV, Flu A&B, Covid) Anterior Nasal Swab     Status: Abnormal   Collection Time: 04/10/22 11:30 AM   Specimen: Anterior Nasal Swab  Result Value Ref Range Status   SARS Coronavirus 2 by RT PCR NEGATIVE NEGATIVE Final    Comment: (NOTE) SARS-CoV-2 target nucleic acids are NOT DETECTED.  The SARS-CoV-2 RNA is generally detectable in upper respiratory specimens during the acute phase of infection. The lowest concentration of SARS-CoV-2 viral copies this assay can detect is 138 copies/mL. A negative result does not preclude SARS-Cov-2 infection and should not be used as the sole basis for treatment or other patient management decisions. A negative result may occur with  improper specimen collection/handling, submission of specimen other than nasopharyngeal swab, presence of viral mutation(s) within the areas targeted by this assay, and inadequate number of viral copies(<138 copies/mL). A negative result must be combined with clinical observations, patient history, and epidemiological information. The expected result is Negative.  Fact Sheet for Patients:  EntrepreneurPulse.com.au  Fact Sheet for Healthcare Providers:  IncredibleEmployment.be  This test is no t yet approved or cleared by the [place] FDA and  has been authorized for detection and/or diagnosis of SARS-CoV-2 by FDA under an Emergency Use Authorization (EUA). This EUA will remain  in effect (meaning this test can be used) for the duration of the COVID-19 declaration under Section 564(b)(1) of the Act, 21 U.S.C.section 360bbb-3(b)(1), unless the authorization is terminated  or revoked sooner.       Influenza A by PCR NEGATIVE NEGATIVE Final   Influenza B by PCR POSITIVE (A) NEGATIVE Final    Comment:  (NOTE) The Xpert Xpress SARS-CoV-2/FLU/RSV plus assay is intended as an aid in the diagnosis of influenza from Nasopharyngeal swab specimens and should not be used as a sole basis for treatment. Nasal washings and aspirates are unacceptable for Xpert Xpress SARS-CoV-2/FLU/RSV testing.  Fact Sheet for Patients: EntrepreneurPulse.com.au  Fact Sheet for Healthcare Providers: IncredibleEmployment.be  This test is not yet approved or cleared by the [place] FDA and has been authorized for detection and/or diagnosis of SARS-CoV-2 by FDA under an Emergency Use Authorization (EUA). This EUA will remain in effect (meaning this test can be used) for the duration of the COVID-19 declaration under Section  564(b)(1) of the Act, 21 U.S.C. section 360bbb-3(b)(1), unless the authorization is terminated or revoked.     Resp Syncytial Virus by PCR NEGATIVE NEGATIVE Final    Comment: (NOTE) Fact Sheet for Patients: EntrepreneurPulse.com.au  Fact Sheet for Healthcare Providers: IncredibleEmployment.be  This test is not yet approved or cleared by the [place] FDA and has been authorized for detection and/or diagnosis of SARS-CoV-2 by FDA under an Emergency Use Authorization (EUA). This EUA will remain in effect (meaning this test can be used) for the duration of the COVID-19 declaration under Section 564(b)(1) of the Act, 21 U.S.C. section 360bbb-3(b)(1), unless the authorization is terminated or revoked.  Performed at [facility], [address] [place], [place] [address]   MRSA Next Gen by PCR, Nasal     Status: None   Collection Time: 04/10/22  9:25 PM   Specimen: Nasal Mucosa; Nasal Swab  Result Value Ref Range Status   MRSA by PCR Next Gen NOT DETECTED NOT DETECTED Final    Comment: (NOTE) The GeneXpert MRSA Assay (FDA approved for NASAL specimens only), is one component of a comprehensive MRSA  colonization surveillance program. It is not intended to diagnose MRSA infection nor to guide or monitor treatment for MRSA infections. Test performance is not FDA approved in patients less than 74 years old. Performed at [facility], [address] [place], [place] [address]     Radiology Studies: CT Hip Left Wo Contrast  Result Date: 04/10/2022 CLINICAL DATA:  Unwitnessed fall. EXAM: CT OF THE LEFT HIP WITHOUT CONTRAST TECHNIQUE: Multidetector CT imaging of the left hip was performed according to the standard protocol. Multiplanar CT image reconstructions were also generated. RADIATION DOSE REDUCTION: This exam was performed according to the departmental dose-optimization program which includes automated exposure control, adjustment of the mA and/or kV according to patient size and/or use of iterative reconstruction technique. COMPARISON:  Pelvis and left femur x-rays from same day. CT left hip dated December 08, 2020. FINDINGS: Bones/Joint/Cartilage No fracture or dislocation. Unchanged mild left hip osteoarthritis with chondrocalcinosis. No joint effusion. Ligaments Ligaments are suboptimally evaluated by CT. Muscles and Tendons Grossly intact.  Unchanged left gluteus minimus muscle atrophy. Soft tissue No fluid collection or hematoma.  No soft tissue mass. Slight interval increase in size of the left ovarian cyst, currently measuring 7.0 cm, previously 6.4 cm. Unchanged single coarse calcification posteriorly. Partially visualized left ureteral stent again noted. Aortoiliac atherosclerotic vascular disease. Sigmoid colonic diverticulosis. IMPRESSION: 1. No acute osseous abnormality. 2. Slight interval increase in size of the left ovarian cyst, currently measuring 7.0 cm, previously 6.4 cm. Given the patient's age, no routine follow-up is recommended. 3.  Aortic Atherosclerosis (ICD10-I70.0). Electronically Signed   By: Titus Dubin M.D.   On: 04/10/2022 10:58    Scheduled Meds:   cephALEXin  500 mg Oral TID   enoxaparin (LOVENOX) injection  40 mg Subcutaneous Q24H   guaiFENesin  600 mg Oral BID   ipratropium-albuterol  3 mL Nebulization TID   oseltamivir  30 mg Oral BID   predniSONE  40 mg Oral Q breakfast   Continuous Infusions:  sodium chloride 75 mL/hr at 04/12/22 0941     LOS: 1 day    Time spent: 59 mins    Duard Brady, MD [facility]   If 7PM-7AM, please contact night-coverage

## 2022-04-13 DIAGNOSIS — W1830XA Fall on same level, unspecified, initial encounter: Secondary | ICD-10-CM | POA: Diagnosis not present

## 2022-04-13 DIAGNOSIS — N390 Urinary tract infection, site not specified: Secondary | ICD-10-CM | POA: Diagnosis not present

## 2022-04-13 LAB — CBC
HCT: 31.3 % — ABNORMAL LOW (ref 36.0–46.0)
Hemoglobin: 10.2 g/dL — ABNORMAL LOW (ref 12.0–15.0)
MCH: 27.6 pg (ref 26.0–34.0)
MCHC: 32.6 g/dL (ref 30.0–36.0)
MCV: 84.6 fL (ref 80.0–100.0)
Platelets: 301 10*3/uL (ref 150–400)
RBC: 3.7 MIL/uL — ABNORMAL LOW (ref 3.87–5.11)
RDW: 14.2 % (ref 11.5–15.5)
WBC: 4.4 10*3/uL (ref 4.0–10.5)
nRBC: 0 % (ref 0.0–0.2)

## 2022-04-13 LAB — BASIC METABOLIC PANEL
Anion gap: 7 (ref 5–15)
BUN: 15 mg/dL (ref 8–23)
CO2: 26 mmol/L (ref 22–32)
Calcium: 7.8 mg/dL — ABNORMAL LOW (ref 8.9–10.3)
Chloride: 98 mmol/L (ref 98–111)
Creatinine, Ser: 0.75 mg/dL (ref 0.44–1.00)
GFR, Estimated: >60 mL/min (ref >60)
Glucose, Bld: 104 mg/dL — ABNORMAL HIGH (ref 70–99)
Potassium: 3.3 mmol/L — ABNORMAL LOW (ref 3.5–5.1)
Sodium: 131 mmol/L — ABNORMAL LOW (ref 135–145)

## 2022-04-13 MED ORDER — POTASSIUM CHLORIDE 20 MEQ PO PACK
40.0000 meq | PACK | Freq: Once | ORAL | Status: AC
  Administered 2022-04-13: 40 meq via ORAL
  Filled 2022-04-13: qty 2

## 2022-04-13 NOTE — Progress Notes (Signed)
Mobility Specialist - Progress Note   Pre-mobility: SpO2 (95) During mobility: SpO2 (94)      04/13/22 1300  Mobility  Activity Ambulated with assistance in hallway;Stood at bedside;Dangled on edge of bed  Level of Assistance Contact guard assist, steadying assist  Assistive Device Front wheel walker  Distance Ambulated (ft) 320 ft  Activity Response Tolerated well  Mobility Referral Yes  $Mobility charge 1 Mobility   Pt resting in recliner on RA upon entry. Pt STS and ambulates to hallway around NS for 2 laps with AD CGA. Pt returned to recliner and left with needs in reach. Daughters present in room.   Loma Sender Mobility Specialist 04/13/22, 1:43 PM

## 2022-04-13 NOTE — Progress Notes (Signed)
Mobility Specialist - Progress Note    04/13/22 1340  Mobility  Activity Ambulated with assistance to bathroom;Dangled on edge of bed  Level of Assistance Contact guard assist, steadying assist  Assistive Device Front wheel walker  Distance Ambulated (ft) 10 ft  Activity Response Tolerated well  Mobility Referral Yes  $Mobility charge 1 Mobility     [employer] Mobility Specialist 04/13/22, 1:44 PM

## 2022-04-13 NOTE — Progress Notes (Signed)
PROGRESS NOTE    [name]  [id number] DOB: 1928/09/24 DOA: 04/10/2022  PCP: Birdie Sons, MD   Brief Narrative: This 89 years old female with PMH significant for hypertension, hypothyroidism, COPD, recurrent UTI, osteoporosis, diffuse large cell lymphoma in remission, mitral regurgitation presented in the ED s/p fall.  Monica Singh's daughter reports that she has been feeling sick for last 1 week with increasing non productive cough associated with shortness of breath and wheezing.  Monica Singh has followed up with her primary care physician yesterday and she was started on Keflex for UTI given + urinalysis.  She was also started on azithromycin and prednisone for suspected COPD exacerbation. ED workup reveals WBC 11.6K, CK within normal limits, Influenza B PCR positive, COVID, RSV PCR negative. CT head,  C-spine,CT left hip obtained without acute abnormalities.  Monica Singh is admitted for further evaluation.  Assessment & Plan:   Principal Problem:   Ground-level fall Active Problems:   COPD with acute exacerbation (HCC)   Hyponatremia   Frequent UTI   Acute encephalopathy   Subclinical hypothyroidism   Essential hypertension   Generalized weakness  Generalized weakness s/p mechanical fall: Monica Singh was brought in the ED after she was being found on the ground by staff @assisted  living facility with assumption that Monica Singh rolled out of bed. Initial concern was for left hip fracture however all imaging is unremarkable. She has increased fall risk in the setting of chronic hyponatremia, UTI and COPD exacerbation. PT and OT evaluation> [facility].  Family requests skilled nursing facility placement. TOC started authorization for [facility].  COPD with acute exacerbation: Acute hypoxic respiratory failure: Monica Singh presented with increasing cough,  shortness of breath and wheezing for last 3 days. She was hypoxic requiring 2 L of supplemental oxygen to maintain saturation above  94%. COVID negative, RSV negative, Influenza B positive. Monica Singh was given Solu-Medrol 125 mg IV once. Continue prednisone 40 mg daily for 4 days. Continue Tamiflu for influenza B. Continue DuoNeb nebulization scheduled and as needed. She is successfully weaned down to room air.  Acute hypoxic respiratory failure ruled out. Check oxygen while ambulation to see if She qualifies for home oxygen.  Hyponatremia: Likely in the setting of hypovolemia due to decreased p.o. intake in the setting of viral illness. Monica Singh also has chronic hyponatremia. Continue IV fluid resuscitation. Recheck labs showed improving sodium level.  Frequent UTIs: Monica Singh does have history of chronic UTIs with complaints of dysuria.   She has seen her PCP  PTA and urinanalysis showed leukocytes.  She is started on Keflex. Continue Keflex for 5 days. Urine culture grew E. coli which is pansensitive.  Essential hypertension.: Monica Singh is not on any antihypertensives at home. Blood pressure is elevated could be secondary to acute pain and acute distress. Continue hydralazine 10 mg IV every 6 as needed for SBP above 170.  Subclinical Hypothyroidism: Monica Singh not taking levothyroxine.  Acute encephalopathy: Monica Singh has some mild cognitive dysfunction however she appears disoriented on arrival.   Continue delirium precautions. Mental status has improved and  back to baseline.   DVT prophylaxis: Lovenox Code Status: Full code Family Communication: Daughters at bed side.   Disposition Plan:  Status is: Observation Monica Singh admitted for COPD exacerbation and UTI.  She is requiring tamiflu, Keflex and breathing treatments. TOC started authorization for [facility].  Consultants:  None  Procedures: None  Antimicrobials:  Anti-infectives (From admission, onward)    Start     Dose/Rate Route Frequency Ordered Stop   04/11/22 1000  oseltamivir (TAMIFLU) capsule 30 mg       See Hyperspace for full Linked  Orders Report.   30 mg Oral 2 times daily 04/10/22 1823 04/15/22 2159   04/10/22 2200  oseltamivir (TAMIFLU) capsule 30 mg  Status:  Discontinued       See Hyperspace for full Linked Orders Report.   30 mg Oral 2 times daily 04/10/22 1315 04/10/22 1823   04/10/22 2200  oseltamivir (TAMIFLU) capsule 75 mg       See Hyperspace for full Linked Orders Report.   75 mg Oral  Once 04/10/22 1823 04/10/22 2118   04/10/22 1600  cephALEXin (KEFLEX) capsule 500 mg        500 mg Oral 3 times daily 04/10/22 1319 04/14/22 2359   04/10/22 1400  oseltamivir (TAMIFLU) capsule 75 mg  Status:  Discontinued       See Hyperspace for full Linked Orders Report.   75 mg Oral  Once 04/10/22 1315 04/10/22 1823   04/10/22 1315  oseltamivir (TAMIFLU) capsule 75 mg  Status:  Discontinued        75 mg Oral 2 times daily 04/10/22 1308 04/10/22 1315      Subjective: Monica Singh seen and examined at bedside.  Overnight events noted.  Monica Singh is very deconditioned. She seems back to her baseline mental status. She is awake and following commands.   She moves all extremities.  She is weaned down to room air now.   Objective: Vitals:   04/12/22 2024 04/13/22 0636 04/13/22 0802 04/13/22 0840  BP:  (!) 162/91  (!) 148/67  Pulse:  90  98  Resp:  19  20  Temp:  98.7 F (37.1 C)  97.9 F (36.6 C)  TempSrc:      SpO2: 93% 98% 93% 95%  Weight:      Height:        Intake/Output Summary (Last 24 hours) at 04/13/2022 0943 Last data filed at 04/12/2022 1940 Gross per 24 hour  Intake 360 ml  Output --  Net 360 ml   Filed Weights   04/10/22 1559  Weight: 60.2 kg    Examination:  General exam: Appears comfortable, not in any acute distress.  Deconditioned. Respiratory system: CTA bilaterally, respiratory effort normal, RR 14. Cardiovascular system: S1-S2 heard, regular rate and rhythm, no murmur. Gastrointestinal system: Abdomen is soft, mildly tender, non distended, BS+ Central nervous system: Alert and oriented x  1 . No focal neurological deficits. Extremities: No edema, no cyanosis, no clubbing. Skin: No rashes, lesions or ulcers Psychiatry:  Mood & affect appropriate.     Data Reviewed: I have personally reviewed following labs and imaging studies  CBC: Recent Labs  Lab 04/10/22 0837 04/11/22 0744 04/12/22 0642 04/13/22 0422  WBC 11.6* 5.6 5.3 4.4  NEUTROABS 9.4*  --   --   --   HGB 13.4 12.3 10.9* 10.2*  HCT 41.3 36.6 32.5* 31.3*  MCV 85.3 83.6 83.3 84.6  PLT 362 313 293 [id number]   Basic Metabolic Panel: Recent Labs  Lab 04/10/22 0837 04/11/22 0744 04/12/22 0642 04/13/22 0422  NA 125* 124* 130* 131*  K 3.4* 3.4* 3.3* 3.3*  CL 88* 92* 94* 98  CO2 25 25 27 26   GLUCOSE 139* 134* 106* 104*  BUN 17 17 17 15   CREATININE 0.91 0.81 0.70 0.75  CALCIUM 8.7* 8.2* 8.3* 7.8*  MG  --   --  1.8  --   PHOS  --   --  2.6  --  GFR: Estimated Creatinine Clearance: 37.9 mL/min (by C-G formula based on SCr of 0.75 mg/dL). Liver Function Tests: Recent Labs  Lab 04/10/22 0837  AST 23  ALT 16  ALKPHOS 71  BILITOT 0.7  PROT 8.0  ALBUMIN 4.1   No results for input(s): "LIPASE", "AMYLASE" in the last 168 hours. No results for input(s): "AMMONIA" in the last 168 hours. Coagulation Profile: No results for input(s): "INR", "PROTIME" in the last 168 hours. Cardiac Enzymes: Recent Labs  Lab 04/10/22 0837  CKTOTAL 53   BNP (last 3 results) No results for input(s): "PROBNP" in the last 8760 hours. HbA1C: No results for input(s): "HGBA1C" in the last 72 hours. CBG: No results for input(s): "GLUCAP" in the last 168 hours. Lipid Profile: No results for input(s): "CHOL", "HDL", "LDLCALC", "TRIG", "CHOLHDL", "LDLDIRECT" in the last 72 hours. Thyroid Function Tests: No results for input(s): "TSH", "T4TOTAL", "FREET4", "T3FREE", "THYROIDAB" in the last 72 hours. Anemia Panel: No results for input(s): "VITAMINB12", "FOLATE", "FERRITIN", "TIBC", "IRON", "RETICCTPCT" in the last 72  hours. Sepsis Labs: No results for input(s): "PROCALCITON", "LATICACIDVEN" in the last 168 hours.  Recent Results (from the past 240 hour(s))  Urine Culture     Status: Abnormal   Collection Time: 04/09/22  1:45 PM   Specimen: Urine   Urine  Result Value Ref Range Status   Urine Culture, Routine Final report (A)  Final   Organism ID, Bacteria Escherichia coli (A)  Final    Comment: Cefazolin <=4 ug/mL Cefazolin with an MIC <=16 predicts susceptibility to the oral agents cefaclor, cefdinir, cefpodoxime, cefprozil, cefuroxime, cephalexin, and loracarbef when used for therapy of uncomplicated urinary tract infections due to E. coli, Klebsiella pneumoniae, and Proteus mirabilis. Greater than 100,000 colony forming units per mL    Antimicrobial Susceptibility Comment  Final    Comment:       ** S = Susceptible; I = Intermediate; R = Resistant **                    P = Positive; N = Negative             MICS are expressed in micrograms per mL    Antibiotic                 RSLT#1    RSLT#2    RSLT#3    RSLT#4 Amoxicillin/Clavulanic Acid    S Ampicillin                     R Cefepime                       S Ceftriaxone                    S Cefuroxime                     S Ciprofloxacin                  S Ertapenem                      S Gentamicin                     S Imipenem                       S Levofloxacin  S Meropenem                      S Nitrofurantoin                 S Piperacillin/Tazobactam        R Tetracycline                   R Tobramycin                     S Trimethoprim/Sulfa             R   Resp panel by RT-PCR (RSV, Flu A&B, Covid) Anterior Nasal Swab     Status: Abnormal   Collection Time: 04/10/22 11:30 AM   Specimen: Anterior Nasal Swab  Result Value Ref Range Status   SARS Coronavirus 2 by RT PCR NEGATIVE NEGATIVE Final    Comment: (NOTE) SARS-CoV-2 target nucleic acids are NOT DETECTED.  The SARS-CoV-2 RNA is generally detectable  in upper respiratory specimens during the acute phase of infection. The lowest concentration of SARS-CoV-2 viral copies this assay can detect is 138 copies/mL. A negative result does not preclude SARS-Cov-2 infection and should not be used as the sole basis for treatment or other Monica Singh management decisions. A negative result may occur with  improper specimen collection/handling, submission of specimen other than nasopharyngeal swab, presence of viral mutation(s) within the areas targeted by this assay, and inadequate number of viral copies(<138 copies/mL). A negative result must be combined with clinical observations, Monica Singh history, and epidemiological information. The expected result is Negative.  Fact Sheet for Patients:  EntrepreneurPulse.com.au  Fact Sheet for Healthcare Providers:  IncredibleEmployment.be  This test is no t yet approved or cleared by the [place] FDA and  has been authorized for detection and/or diagnosis of SARS-CoV-2 by FDA under an Emergency Use Authorization (EUA). This EUA will remain  in effect (meaning this test can be used) for the duration of the COVID-19 declaration under Section 564(b)(1) of the Act, 21 U.S.C.section 360bbb-3(b)(1), unless the authorization is terminated  or revoked sooner.       Influenza A by PCR NEGATIVE NEGATIVE Final   Influenza B by PCR POSITIVE (A) NEGATIVE Final    Comment: (NOTE) The Xpert Xpress SARS-CoV-2/FLU/RSV plus assay is intended as an aid in the diagnosis of influenza from Nasopharyngeal swab specimens and should not be used as a sole basis for treatment. Nasal washings and aspirates are unacceptable for Xpert Xpress SARS-CoV-2/FLU/RSV testing.  Fact Sheet for Patients: EntrepreneurPulse.com.au  Fact Sheet for Healthcare Providers: IncredibleEmployment.be  This test is not yet approved or cleared by the [place] FDA  and has been authorized for detection and/or diagnosis of SARS-CoV-2 by FDA under an Emergency Use Authorization (EUA). This EUA will remain in effect (meaning this test can be used) for the duration of the COVID-19 declaration under Section 564(b)(1) of the Act, 21 U.S.C. section 360bbb-3(b)(1), unless the authorization is terminated or revoked.     Resp Syncytial Virus by PCR NEGATIVE NEGATIVE Final    Comment: (NOTE) Fact Sheet for Patients: EntrepreneurPulse.com.au  Fact Sheet for Healthcare Providers: IncredibleEmployment.be  This test is not yet approved or cleared by the [place] FDA and has been authorized for detection and/or diagnosis of SARS-CoV-2 by FDA under an Emergency Use Authorization (EUA). This EUA will remain in effect (meaning this test can be used) for the duration of the COVID-19 declaration under Section 564(b)(1)  of the Act, 21 U.S.C. section 360bbb-3(b)(1), unless the authorization is terminated or revoked.  Performed at [facility], [address] [place], [place] [address]   MRSA Next Gen by PCR, Nasal     Status: None   Collection Time: 04/10/22  9:25 PM   Specimen: Nasal Mucosa; Nasal Swab  Result Value Ref Range Status   MRSA by PCR Next Gen NOT DETECTED NOT DETECTED Final    Comment: (NOTE) The GeneXpert MRSA Assay (FDA approved for NASAL specimens only), is one component of a comprehensive MRSA colonization surveillance program. It is not intended to diagnose MRSA infection nor to guide or monitor treatment for MRSA infections. Test performance is not FDA approved in patients less than 31 years old. Performed at [facility], [address] [place], [place] [address]     Radiology Studies: No results found.  Scheduled Meds:  cephALEXin  500 mg Oral TID   enoxaparin (LOVENOX) injection  40 mg Subcutaneous Q24H   guaiFENesin  600 mg Oral BID   ipratropium-albuterol  3 mL  Nebulization TID   oseltamivir  30 mg Oral BID   potassium chloride  40 mEq Oral Once   predniSONE  40 mg Oral Q breakfast   Continuous Infusions:  sodium chloride 75 mL/hr at 04/12/22 0941     LOS: 2 days    Time spent: 35 mins    Duard Brady, MD [facility]   If 7PM-7AM, please contact night-coverage

## 2022-04-14 DIAGNOSIS — N390 Urinary tract infection, site not specified: Secondary | ICD-10-CM | POA: Diagnosis not present

## 2022-04-14 DIAGNOSIS — W1830XA Fall on same level, unspecified, initial encounter: Secondary | ICD-10-CM | POA: Diagnosis not present

## 2022-04-14 LAB — BASIC METABOLIC PANEL
Anion gap: 7 (ref 5–15)
BUN: 12 mg/dL (ref 8–23)
CO2: 24 mmol/L (ref 22–32)
Calcium: 7.8 mg/dL — ABNORMAL LOW (ref 8.9–10.3)
Chloride: 95 mmol/L — ABNORMAL LOW (ref 98–111)
Creatinine, Ser: 0.67 mg/dL (ref 0.44–1.00)
GFR, Estimated: >60 mL/min (ref >60)
Glucose, Bld: 104 mg/dL — ABNORMAL HIGH (ref 70–99)
Potassium: 3.6 mmol/L (ref 3.5–5.1)
Sodium: 126 mmol/L — ABNORMAL LOW (ref 135–145)

## 2022-04-14 LAB — CBC
HCT: 31.5 % — ABNORMAL LOW (ref 36.0–46.0)
Hemoglobin: 10.3 g/dL — ABNORMAL LOW (ref 12.0–15.0)
MCH: 27.5 pg (ref 26.0–34.0)
MCHC: 32.7 g/dL (ref 30.0–36.0)
MCV: 84 fL (ref 80.0–100.0)
Platelets: 286 10*3/uL (ref 150–400)
RBC: 3.75 MIL/uL — ABNORMAL LOW (ref 3.87–5.11)
RDW: 14.4 % (ref 11.5–15.5)
WBC: 6 10*3/uL (ref 4.0–10.5)
nRBC: 0 % (ref 0.0–0.2)

## 2022-04-14 MED ORDER — LOSARTAN POTASSIUM 25 MG PO TABS: 25.0000 mg | ORAL_TABLET | Freq: Every day | ORAL | Status: DC

## 2022-04-14 MED ORDER — AMLODIPINE BESYLATE 5 MG PO TABS
5.0000 mg | ORAL_TABLET | Freq: Every day | ORAL | Status: DC
  Administered 2022-04-14 – 2022-04-15 (×2): 5 mg via ORAL
  Filled 2022-04-14 (×2): qty 1

## 2022-04-14 MED ORDER — SODIUM CHLORIDE 1 G PO TABS
1.0000 g | ORAL_TABLET | Freq: Two times a day (BID) | ORAL | Status: DC
  Administered 2022-04-14 – 2022-04-15 (×2): 1 g via ORAL
  Filled 2022-04-14 (×2): qty 1

## 2022-04-14 NOTE — TOC Progression Note (Signed)
Transition of Care [facility]) - Progression Note    Patient Details  Name: SHAQUISHA CHIN MRN: [id number] Date of Birth: 1928-02-24  [facility]) CM/SW Contact  Izola Price, RN Phone Number: 04/14/2022, 1:31 PM  Clinical Narrative: 3/17: Damaris Schooner with patient and daughter yesterday to update. Insurance authorization [id number] next review 04/16/22. Plan to DC Monday per provider due to some ongoing medical needs when provider updated on SNF authorization. Simmie Davies RN CM          Expected Discharge Plan and Services                                               Social Determinants of Health (SDOH) Interventions SDOH Screenings   Food Insecurity: No Food Insecurity (04/10/2022)  Housing: Low Risk  (04/10/2022)  Transportation Needs: No Transportation Needs (04/10/2022)  Utilities: Not At Risk (04/10/2022)  Alcohol Screen: Low Risk  (04/09/2022)  Depression (PHQ2-9): Low Risk  (04/09/2022)  Financial Resource Strain: Low Risk  (08/15/2021)  Physical Activity: Insufficiently Active (08/15/2021)  Social Connections: Socially Isolated (08/15/2021)  Stress: No Stress Concern Present (08/15/2021)  Tobacco Use: Medium Risk (04/10/2022)    Readmission Risk Interventions     No data to display

## 2022-04-14 NOTE — Progress Notes (Signed)
Mobility Specialist - Progress Note     04/14/22 1422  Mobility  Activity Ambulated with assistance in hallway  Level of Assistance Contact guard assist, steadying assist  Assistive Device Front wheel walker  Distance Ambulated (ft) 180 ft  Range of Motion/Exercises Active  Activity Response Tolerated well  Mobility Referral Yes  $Mobility charge 1 Mobility   Pt resting in recliner on RA upon entry. Pt STS and ambulates to hallway CGA with AD. Pt had steady gate but CGA for safety/weakeness. Pt returned to recliner and left with needs in reach and daughter present in room.  Loma Sender Mobility Specialist 04/14/22, 2:23 PM

## 2022-04-14 NOTE — Progress Notes (Signed)
PROGRESS NOTE    [name]  [id number] DOB: 11/12/1928 DOA: 04/10/2022  PCP: Birdie Sons, MD   Brief Narrative: This 89 years old female with PMH significant for hypertension, hypothyroidism, COPD, recurrent UTI, osteoporosis, diffuse large cell lymphoma in remission, mitral regurgitation presented in the ED s/p fall.  Patient's daughter reports that she has been feeling sick for last 1 week with increasing non productive cough associated with shortness of breath and wheezing.  Patient has followed up with her primary care physician yesterday and she was started on Keflex for UTI given + urinalysis.  She was also started on azithromycin and prednisone for suspected COPD exacerbation. ED workup reveals WBC 11.6K, CK within normal limits, Influenza B PCR positive, COVID, RSV PCR negative. CT head,  C-spine,CT left hip obtained without acute abnormalities.  Patient is admitted for further evaluation.  Assessment & Plan:   Principal Problem:   Ground-level fall Active Problems:   COPD with acute exacerbation (HCC)   Hyponatremia   Frequent UTI   Acute encephalopathy   Subclinical hypothyroidism   Essential hypertension   Generalized weakness  Generalized weakness s/p mechanical fall: Patient was brought in the ED after she was being found on the ground by staff @assisted  living facility with assumption that patient might have rolled out of bed. Initial concern was for left hip fracture however all imaging is unremarkable. She has increased fall risk in the setting of chronic hyponatremia, UTI and COPD exacerbation. PT and OT evaluation> [facility]HS.  Family requests skilled nursing facility placement. TOC started authorization for [facility].  COPD with acute exacerbation: Acute hypoxic respiratory failure: Patient presented with increasing cough,  shortness of breath and wheezing for last 3 days. She was hypoxic requiring 2 L of supplemental oxygen to maintain saturation  above 94%. COVID negative, RSV negative, Influenza B positive. Patient was given Solu-Medrol 125 mg IV once. Continue prednisone 40 mg daily for 4 days. Continue Tamiflu for influenza B. Continue DuoNeb nebulization scheduled and as needed. She is successfully weaned down to room air.  Acute hypoxic respiratory failure ruled out. Check oxygen while ambulation to see if She qualifies for home oxygen.  Hyponatremia: Likely in the setting of hypovolemia due to decreased p.o. intake in the setting of viral illness. Patient also has chronic hyponatremia. Continue IV fluid resuscitation. Recheck labs showed improving sodium level.  Frequent UTIs: Patient does have history of chronic UTIs with complaints of dysuria.   She has seen her PCP  PTA and urinanalysis showed leukocytes.  She is started on Keflex. Continue Keflex for 5 days. Urine culture grew E. coli which is pansensitive.  Essential hypertension.: Patient is not on any antihypertensives at home. Blood pressure is elevated could be secondary to acute pain and acute distress. Continue hydralazine 10 mg IV every 6 as needed for SBP above 170. Start Amlodipine 5 mg daily.  Subclinical Hypothyroidism: Patient not taking levothyroxine. Obtain thyroid profile.  Acute encephalopathy: Patient has some mild cognitive dysfunction however she appeared disoriented on arrival.   Continue delirium precautions. Mental status has improved and  back to baseline.   DVT prophylaxis: Lovenox Code Status: Full code Family Communication: Daughters at bed side.   Disposition Plan:  Status is: Observation Patient admitted for COPD exacerbation due to flu  and UTI.  She is requiring tamiflu, Keflex and breathing treatments. TOC started authorization for [facility].  Consultants:  None  Procedures: None  Antimicrobials:  Anti-infectives (From admission, onward)  Start     Dose/Rate Route Frequency Ordered Stop   04/11/22 1000   oseltamivir (TAMIFLU) capsule 30 mg       See Hyperspace for full Linked Orders Report.   30 mg Oral 2 times daily 04/10/22 1823 04/15/22 2159   04/10/22 2200  oseltamivir (TAMIFLU) capsule 30 mg  Status:  Discontinued       See Hyperspace for full Linked Orders Report.   30 mg Oral 2 times daily 04/10/22 1315 04/10/22 1823   04/10/22 2200  oseltamivir (TAMIFLU) capsule 75 mg       See Hyperspace for full Linked Orders Report.   75 mg Oral  Once 04/10/22 1823 04/10/22 2118   04/10/22 1600  cephALEXin (KEFLEX) capsule 500 mg        500 mg Oral 3 times daily 04/10/22 1319 04/14/22 2359   04/10/22 1400  oseltamivir (TAMIFLU) capsule 75 mg  Status:  Discontinued       See Hyperspace for full Linked Orders Report.   75 mg Oral  Once 04/10/22 1315 04/10/22 1823   04/10/22 1315  oseltamivir (TAMIFLU) capsule 75 mg  Status:  Discontinued        75 mg Oral 2 times daily 04/10/22 1308 04/10/22 1315      Subjective: Patient seen and examined at bedside.  Overnight events noted.   Patient was sitting comfortably on the chair,  having breakfast.   She is back to her baseline mental status.She moves all her extremities.   She is weaned down to room air successfully.  Objective: Vitals:   04/13/22 2051 04/13/22 2209 04/14/22 0542 04/14/22 0937  BP:  (!) 124/91 (!) 145/95 (!) 152/70  Pulse:  92 99 (!) 110  Resp:  16 20 16   Temp:  97.9 F (36.6 C) 98.8 F (37.1 C) 98.4 F (36.9 C)  TempSrc:    Oral  SpO2: 92% 91% 93% 92%  Weight:      Height:        Intake/Output Summary (Last 24 hours) at 04/14/2022 1003 Last data filed at 04/13/2022 1056 Gross per 24 hour  Intake 2753.18 ml  Output --  Net 2753.18 ml   Filed Weights   04/10/22 1559  Weight: 60.2 kg    Examination:  General exam: Appears comfortable, deconditioned, not in any acute distress. Respiratory system: CTA bilaterally, respiratory effort normal, RR 15 Cardiovascular system: S1-S2 heard, regular rate and rhythm, no  murmur. Gastrointestinal system: Abdomen is soft, mildly tender, non distended, BS+ Central nervous system: Alert and oriented x 2, no focal neurological deficits. Extremities: No edema, no cyanosis, no clubbing. Skin: No rashes, lesions or ulcers Psychiatry:  Mood & affect appropriate.     Data Reviewed: I have personally reviewed following labs and imaging studies  CBC: Recent Labs  Lab 04/10/22 0837 04/11/22 0744 04/12/22 0642 04/13/22 0422 04/14/22 0320  WBC 11.6* 5.6 5.3 4.4 6.0  NEUTROABS 9.4*  --   --   --   --   HGB 13.4 12.3 10.9* 10.2* 10.3*  HCT 41.3 36.6 32.5* 31.3* 31.5*  MCV 85.3 83.6 83.3 84.6 84.0  PLT 362 313 293 301 [id number]   Basic Metabolic Panel: Recent Labs  Lab 04/10/22 0837 04/11/22 0744 04/12/22 0642 04/13/22 0422 04/14/22 0320  NA 125* 124* 130* 131* 126*  K 3.4* 3.4* 3.3* 3.3* 3.6  CL 88* 92* 94* 98 95*  CO2 25 25 27 26 24   GLUCOSE 139* 134* 106* 104* 104*  BUN 17 17  17 15 12   CREATININE 0.91 0.81 0.70 0.75 0.67  CALCIUM 8.7* 8.2* 8.3* 7.8* 7.8*  MG  --   --  1.8  --   --   PHOS  --   --  2.6  --   --    GFR: Estimated Creatinine Clearance: 37.9 mL/min (by C-G formula based on SCr of 0.67 mg/dL). Liver Function Tests: Recent Labs  Lab 04/10/22 0837  AST 23  ALT 16  ALKPHOS 71  BILITOT 0.7  PROT 8.0  ALBUMIN 4.1   No results for input(s): "LIPASE", "AMYLASE" in the last 168 hours. No results for input(s): "AMMONIA" in the last 168 hours. Coagulation Profile: No results for input(s): "INR", "PROTIME" in the last 168 hours. Cardiac Enzymes: Recent Labs  Lab 04/10/22 0837  CKTOTAL 53   BNP (last 3 results) No results for input(s): "PROBNP" in the last 8760 hours. HbA1C: No results for input(s): "HGBA1C" in the last 72 hours. CBG: No results for input(s): "GLUCAP" in the last 168 hours. Lipid Profile: No results for input(s): "CHOL", "HDL", "LDLCALC", "TRIG", "CHOLHDL", "LDLDIRECT" in the last 72 hours. Thyroid Function  Tests: No results for input(s): "TSH", "T4TOTAL", "FREET4", "T3FREE", "THYROIDAB" in the last 72 hours. Anemia Panel: No results for input(s): "VITAMINB12", "FOLATE", "FERRITIN", "TIBC", "IRON", "RETICCTPCT" in the last 72 hours. Sepsis Labs: No results for input(s): "PROCALCITON", "LATICACIDVEN" in the last 168 hours.  Recent Results (from the past 240 hour(s))  Urine Culture     Status: Abnormal   Collection Time: 04/09/22  1:45 PM   Specimen: Urine   Urine  Result Value Ref Range Status   Urine Culture, Routine Final report (A)  Final   Organism ID, Bacteria Escherichia coli (A)  Final    Comment: Cefazolin <=4 ug/mL Cefazolin with an MIC <=16 predicts susceptibility to the oral agents cefaclor, cefdinir, cefpodoxime, cefprozil, cefuroxime, cephalexin, and loracarbef when used for therapy of uncomplicated urinary tract infections due to E. coli, Klebsiella pneumoniae, and Proteus mirabilis. Greater than 100,000 colony forming units per mL    Antimicrobial Susceptibility Comment  Final    Comment:       ** S = Susceptible; I = Intermediate; R = Resistant **                    P = Positive; N = Negative             MICS are expressed in micrograms per mL    Antibiotic                 RSLT#1    RSLT#2    RSLT#3    RSLT#4 Amoxicillin/Clavulanic Acid    S Ampicillin                     R Cefepime                       S Ceftriaxone                    S Cefuroxime                     S Ciprofloxacin                  S Ertapenem                      S Gentamicin  S Imipenem                       S Levofloxacin                   S Meropenem                      S Nitrofurantoin                 S Piperacillin/Tazobactam        R Tetracycline                   R Tobramycin                     S Trimethoprim/Sulfa             R   Resp panel by RT-PCR (RSV, Flu A&B, Covid) Anterior Nasal Swab     Status: Abnormal   Collection Time: 04/10/22 11:30 AM    Specimen: Anterior Nasal Swab  Result Value Ref Range Status   SARS Coronavirus 2 by RT PCR NEGATIVE NEGATIVE Final    Comment: (NOTE) SARS-CoV-2 target nucleic acids are NOT DETECTED.  The SARS-CoV-2 RNA is generally detectable in upper respiratory specimens during the acute phase of infection. The lowest concentration of SARS-CoV-2 viral copies this assay can detect is 138 copies/mL. A negative result does not preclude SARS-Cov-2 infection and should not be used as the sole basis for treatment or other patient management decisions. A negative result may occur with  improper specimen collection/handling, submission of specimen other than nasopharyngeal swab, presence of viral mutation(s) within the areas targeted by this assay, and inadequate number of viral copies(<138 copies/mL). A negative result must be combined with clinical observations, patient history, and epidemiological information. The expected result is Negative.  Fact Sheet for Patients:  EntrepreneurPulse.com.au  Fact Sheet for Healthcare Providers:  IncredibleEmployment.be  This test is no t yet approved or cleared by the [place] FDA and  has been authorized for detection and/or diagnosis of SARS-CoV-2 by FDA under an Emergency Use Authorization (EUA). This EUA will remain  in effect (meaning this test can be used) for the duration of the COVID-19 declaration under Section 564(b)(1) of the Act, 21 U.S.C.section 360bbb-3(b)(1), unless the authorization is terminated  or revoked sooner.       Influenza A by PCR NEGATIVE NEGATIVE Final   Influenza B by PCR POSITIVE (A) NEGATIVE Final    Comment: (NOTE) The Xpert Xpress SARS-CoV-2/FLU/RSV plus assay is intended as an aid in the diagnosis of influenza from Nasopharyngeal swab specimens and should not be used as a sole basis for treatment. Nasal washings and aspirates are unacceptable for Xpert Xpress  SARS-CoV-2/FLU/RSV testing.  Fact Sheet for Patients: EntrepreneurPulse.com.au  Fact Sheet for Healthcare Providers: IncredibleEmployment.be  This test is not yet approved or cleared by the [place] FDA and has been authorized for detection and/or diagnosis of SARS-CoV-2 by FDA under an Emergency Use Authorization (EUA). This EUA will remain in effect (meaning this test can be used) for the duration of the COVID-19 declaration under Section 564(b)(1) of the Act, 21 U.S.C. section 360bbb-3(b)(1), unless the authorization is terminated or revoked.     Resp Syncytial Virus by PCR NEGATIVE NEGATIVE Final    Comment: (NOTE) Fact Sheet for Patients: EntrepreneurPulse.com.au  Fact Sheet for Healthcare Providers: IncredibleEmployment.be  This test is not yet approved or cleared by  the [employer] and has been authorized for detection and/or diagnosis of SARS-CoV-2 by FDA under an Emergency Use Authorization (EUA). This EUA will remain in effect (meaning this test can be used) for the duration of the COVID-19 declaration under Section 564(b)(1) of the Act, 21 U.S.C. section 360bbb-3(b)(1), unless the authorization is terminated or revoked.  Performed at [facility], [address] [place], [place] [address]   MRSA Next Gen by PCR, Nasal     Status: None   Collection Time: 04/10/22  9:25 PM   Specimen: Nasal Mucosa; Nasal Swab  Result Value Ref Range Status   MRSA by PCR Next Gen NOT DETECTED NOT DETECTED Final    Comment: (NOTE) The GeneXpert MRSA Assay (FDA approved for NASAL specimens only), is one component of a comprehensive MRSA colonization surveillance program. It is not intended to diagnose MRSA infection nor to guide or monitor treatment for MRSA infections. Test performance is not FDA approved in patients less than 84 years old. Performed at [facility], [address], [place],  [address]     Radiology Studies: No results found.  Scheduled Meds:  amLODipine  5 mg Oral Daily   cephALEXin  500 mg Oral TID   enoxaparin (LOVENOX) injection  40 mg Subcutaneous Q24H   guaiFENesin  600 mg Oral BID   ipratropium-albuterol  3 mL Nebulization TID   oseltamivir  30 mg Oral BID   predniSONE  40 mg Oral Q breakfast   Continuous Infusions:   LOS: 3 days    Time spent: 35 mins   Duard Brady, MD [facility]   If 7PM-7AM, please contact night-coverage

## 2022-04-15 DIAGNOSIS — N390 Urinary tract infection, site not specified: Secondary | ICD-10-CM | POA: Diagnosis not present

## 2022-04-15 DIAGNOSIS — W1830XA Fall on same level, unspecified, initial encounter: Secondary | ICD-10-CM | POA: Diagnosis not present

## 2022-04-15 LAB — BASIC METABOLIC PANEL
Anion gap: 12 (ref 5–15)
BUN: 14 mg/dL (ref 8–23)
CO2: 24 mmol/L (ref 22–32)
Calcium: 8.6 mg/dL — ABNORMAL LOW (ref 8.9–10.3)
Chloride: 93 mmol/L — ABNORMAL LOW (ref 98–111)
Creatinine, Ser: 0.76 mg/dL (ref 0.44–1.00)
GFR, Estimated: >60 mL/min (ref >60)
Glucose, Bld: 101 mg/dL — ABNORMAL HIGH (ref 70–99)
Potassium: 3.4 mmol/L — ABNORMAL LOW (ref 3.5–5.1)
Sodium: 129 mmol/L — ABNORMAL LOW (ref 135–145)

## 2022-04-15 LAB — PHOSPHORUS: Phosphorus: 3.3 mg/dL (ref 2.5–4.6)

## 2022-04-15 LAB — MAGNESIUM: Magnesium: 1.8 mg/dL (ref 1.7–2.4)

## 2022-04-15 MED ORDER — POTASSIUM CHLORIDE 20 MEQ PO PACK
40.0000 meq | PACK | Freq: Once | ORAL | Status: AC
  Administered 2022-04-15: 40 meq via ORAL
  Filled 2022-04-15: qty 2

## 2022-04-15 MED ORDER — AMLODIPINE BESYLATE 5 MG PO TABS: 5.0000 mg | ORAL_TABLET | Freq: Every day | ORAL | 1 refills | Status: DC

## 2022-04-15 MED ORDER — GUAIFENESIN ER 600 MG PO TB12: 600.0000 mg | ORAL_TABLET | Freq: Two times a day (BID) | ORAL | 0 refills | Status: AC

## 2022-04-15 NOTE — Discharge Instructions (Signed)
Advised to follow-up with primary care physician in 1 week. Advised to continue current medications. Patient has completed Tamiflu for 5 days for influenza B. Patient has completed Keflex for 5 days for UTI.

## 2022-04-15 NOTE — TOC Transition Note (Signed)
Transition of Care [facility]) - CM/SW Discharge Note   Patient Details  Name: Monica Singh MRN: [id number] Date of Birth: 11/09/1928  [facility] CM/SW Contact:  Gerilyn Pilgrim, LCSW Phone Number: 04/15/2022, 8:43 AM   Clinical Narrative:   Pt to [facility] to [facility] today. RN given number for report. [facility] notified. Medical necessity printed to the unit. CSW to send DC summary once provided.       Barriers to Discharge: Continued Medical Work up   Patient Goals and CMS Choice      Discharge Placement                         Discharge Plan and Services Additional resources added to the After Visit Summary for                                       Social Determinants of Health (SDOH) Interventions SDOH Screenings   Food Insecurity: No Food Insecurity (04/10/2022)  Housing: Low Risk  (04/10/2022)  Transportation Needs: No Transportation Needs (04/10/2022)  Utilities: Not At Risk (04/10/2022)  Alcohol Screen: Low Risk  (04/09/2022)  Depression (PHQ2-9): Low Risk  (04/09/2022)  Financial Resource Strain: Low Risk  (08/15/2021)  Physical Activity: Insufficiently Active (08/15/2021)  Social Connections: Socially Isolated (08/15/2021)  Stress: No Stress Concern Present (08/15/2021)  Tobacco Use: Medium Risk (04/10/2022)     Readmission Risk Interventions     No data to display

## 2022-04-15 NOTE — Care Management Important Message (Signed)
Important Message  Patient Details  Name: Monica Singh MRN: [id number] Date of Birth: [birth year]   Medicare Important Message Given:  Yes  Reviewed Medicare IM with Gilford Rile, daughter, at [phone].  Copy of Medicare IM sent securely to daughter's attention at email address on file: [email or web address].    Dannette Barbara 04/15/2022, 10:09 AM

## 2022-04-15 NOTE — Discharge Summary (Signed)
Physician Discharge Summary  [place] [id number] DOB: Sep 04, 1928 DOA: 04/10/2022  PCP: Birdie Sons, MD  Admit date: 04/10/2022  Discharge date: 04/15/2022  Admitted From: Home.  Disposition:  [facility])  Recommendations for Outpatient Follow-up:  Follow up with PCP in 1-2 weeks. Please obtain BMP/CBC in one week. Patient has completed Tamiflu for 5 days for influenza B. Patient has completed Keflex for 5 days for UTI.  Home Health:None Equipment/Devices:None  Discharge Condition: Stable CODE STATUS:Full code Diet recommendation: Heart Healthy   Brief [facility] Course: This 89 years old female with PMH significant for hypertension, hypothyroidism, COPD, recurrent UTI, osteoporosis, diffuse large cell lymphoma in remission, mitral regurgitation presented in the ED s/p mechanical  fall. Patient's daughter reports that she has been feeling sick for last 1 week with increasing non productive cough associated with shortness of breath and wheezing. Patient has followed up with her primary care physician yesterday and she was started on Keflex for UTI given + urinalysis. She was also started on azithromycin and prednisone for suspected COPD exacerbation. ED workup reveals WBC 11.6K, CK within normal limits, Influenza B PCR positive, COVID, RSV PCR negative. CT head, C-spine,CT left hip obtained without acute abnormalities. Patient was admitted for further evaluation. Patient was started on Tamiflu, started on prednisone for 5 days.  She has completed Tamiflu for 5 days.  She has also completed Keflex for UTI for 5 days.  Patient initially hypoxic requiring 2 L of oxygen which successfully weaned down to room air.  Patient feels better and wants to be discharged.  Patient being discharged to skilled nursing facility for rehab.   Discharge Diagnoses:  Principal Problem:   Ground-level fall Active Problems:   COPD with acute exacerbation  (HCC)   Hyponatremia   Frequent UTI   Acute encephalopathy   Subclinical hypothyroidism   Essential hypertension   Generalized weakness  Generalized weakness s/p mechanical fall: Patient was brought in the ED after she was being found on the ground by staff @assisted  living facility with assumption that patient might have rolled out of bed. Initial concern was for left hip fracture however all imaging is unremarkable. She has increased fall risk in the setting of chronic hyponatremia, UTI and COPD exacerbation. PT and OT evaluation> HHS.  Family requests skilled nursing facility placement. Patient is being [facility] for [facility].   COPD with acute exacerbation: Acute hypoxic respiratory failure: Patient presented with increasing cough,  shortness of breath and wheezing for last 3 days. She was hypoxic requiring 2 L of supplemental oxygen to maintain saturation above 94%. COVID negative, RSV negative, Influenza B positive. Patient was given Solu-Medrol 125 mg IV once. Completed prednisone for 5 days. Completed Tamiflu for influenza B. Continue DuoNeb nebulization scheduled and as needed. She is successfully weaned down to room air.  Acute hypoxic respiratory failure ruled out.  Hyponatremia: Likely in the setting of hypovolemia due to decreased p.o. intake in the setting of viral illness. Patient also has chronic hyponatremia. Continue IV fluid resuscitation. Recheck labs showed improving sodium level.   Frequent UTIs: Patient does have history of chronic UTIs with complaints of dysuria.   She has seen her PCP  PTA and urinanalysis showed leukocytes.  She is started on Keflex. Completed Keflex for 5 days. Urine culture grew E. coli which is pansensitive.   Essential hypertension.: Continue Amlodipine 5 mg daily.   Subclinical Hypothyroidism: Patient not taking levothyroxine.   Acute encephalopathy: Patient has  some mild cognitive dysfunction however she  appeared disoriented on arrival.   Continue delirium precautions. Mental status has improved and  back to baseline.  Discharge Instructions  Discharge Instructions     Call MD for:  difficulty breathing, headache or visual disturbances   Complete by: As directed    Call MD for:  persistant dizziness or light-headedness   Complete by: As directed    Call MD for:  persistant nausea and vomiting   Complete by: As directed    Diet - low sodium heart healthy   Complete by: As directed    Diet Carb Modified   Complete by: As directed    Discharge instructions   Complete by: As directed    Advised to follow-up with primary care physician in 1 week. Advised to continue current medications. Patient has completed Tamiflu for 5 days for influenza B. Patient has completed Keflex for 5 days for UTI.   Increase activity slowly   Complete by: As directed       Allergies as of 04/15/2022       Reactions   Ciprofloxacin Itching   Whelps on face reported 03/16/2021   Milk-related Compounds Diarrhea, Nausea And Vomiting   Gas too   Latex Itching   Misc. Sulfonamide Containing Compounds    Milk (cow) Diarrhea, Nausea And Vomiting   Milk allergy   Sulfa Antibiotics Hives, Itching        Medication List     STOP taking these medications    azithromycin 250 MG tablet Commonly known as: ZITHROMAX   cephALEXin 500 MG capsule Commonly known as: KEFLEX   predniSONE 20 MG tablet Commonly known as: DELTASONE       TAKE these medications    Align 4 MG Caps Take 1 capsule (4 mg total) by mouth daily.   amLODipine 5 MG tablet Commonly known as: NORVASC Take 1 tablet (5 mg total) by mouth daily. Start taking on: April 16, 2022   Combivent Respimat 20-100 MCG/ACT Aers respimat Generic drug: Ipratropium-Albuterol INHALE 2  PUFFS INTO THE LUNGS 3 TIMES DAILY   DELSYM COUGH RELIEF MT Use as directed in the mouth or throat.   FOLIC ACID PO Take by mouth.   guaiFENesin 600 MG  12 hr tablet Commonly known as: MUCINEX Take 1 tablet (600 mg total) by mouth 2 (two) times daily for 6 days.   ibuprofen 200 MG tablet Commonly known as: ADVIL Take 400 mg by mouth every 6 (six) hours as needed.   methenamine 1 g tablet Commonly known as: Hiprex Take 1 tablet (1 g total) by mouth 2 (two) times daily with a meal.   psyllium 95 % Pack Commonly known as: HYDROCIL/METAMUCIL Take 1 packet by mouth daily.   VITAMIN B-12 ER PO Take by mouth.        Contact information for follow-up providers     Birdie Sons, MD Follow up in 1 week(s).   Specialty: Family Medicine Contact information: [address] [place] [address] [phone]              Contact information for after-discharge care     Destination     [facility] Preferred SNF .   Service: Skilled Nursing Contact information: [place] [address] [phone]                    Allergies  Allergen Reactions   Ciprofloxacin Itching  Whelps on face reported 03/16/2021   Milk-Related Compounds Diarrhea and Nausea And Vomiting    Gas too   Latex Itching   Misc. Sulfonamide Containing Compounds    Milk (Cow) Diarrhea and Nausea And Vomiting    Milk allergy    Sulfa Antibiotics Hives and Itching    Consultations: None   Procedures/Studies: CT Hip Left Wo Contrast  Result Date: 04/10/2022 CLINICAL DATA:  Unwitnessed fall. EXAM: CT OF THE LEFT HIP WITHOUT CONTRAST TECHNIQUE: Multidetector CT imaging of the left hip was performed according to the standard protocol. Multiplanar CT image reconstructions were also generated. RADIATION DOSE REDUCTION: This exam was performed according to the departmental dose-optimization program which includes automated exposure control, adjustment of the mA and/or kV according to patient size and/or use of iterative reconstruction technique. COMPARISON:  Pelvis and left femur  x-rays from same day. CT left hip dated December 08, 2020. FINDINGS: Bones/Joint/Cartilage No fracture or dislocation. Unchanged mild left hip osteoarthritis with chondrocalcinosis. No joint effusion. Ligaments Ligaments are suboptimally evaluated by CT. Muscles and Tendons Grossly intact.  Unchanged left gluteus minimus muscle atrophy. Soft tissue No fluid collection or hematoma.  No soft tissue mass. Slight interval increase in size of the left ovarian cyst, currently measuring 7.0 cm, previously 6.4 cm. Unchanged single coarse calcification posteriorly. Partially visualized left ureteral stent again noted. Aortoiliac atherosclerotic vascular disease. Sigmoid colonic diverticulosis. IMPRESSION: 1. No acute osseous abnormality. 2. Slight interval increase in size of the left ovarian cyst, currently measuring 7.0 cm, previously 6.4 cm. Given the patient's age, no routine follow-up is recommended. 3.  Aortic Atherosclerosis (ICD10-I70.0). Electronically Signed   By: Titus Dubin M.D.   On: 04/10/2022 10:58   DG FEMUR MIN 2 VIEWS LEFT  Result Date: 04/10/2022 CLINICAL DATA:  Unwitnessed fall today. Concern for left leg injury. Left leg shortening and rotation noted. EXAM: LEFT FEMUR 2 VIEWS COMPARISON:  CT left hip 12/08/2020 FINDINGS: There is diffuse decreased bone mineralization. Within this limitation, no definite left femoral acute fracture line is seen. There is mild left femoral head-neck junction circumferential degenerative osteophytosis that is similar to prior. Mild calcifications within the superolateral aspect of the left femoroacetabular joint. Status post total left knee arthroplasty.No perihardware lucency is seen to indicate hardware failure or loosening. No knee joint effusion. Mild vascular calcifications. Partial visualization of left nephroureteral stent. IMPRESSION: 1. Within the limitation of diffuse decreased bone mineralization, no acute fracture is seen. 2. Mild left hip  osteoarthritis. 3. Status post total left knee arthroplasty. Electronically Signed   By: Yvonne Kendall M.D.   On: 04/10/2022 09:39   DG Pelvis 1-2 Views  Result Date: 04/10/2022 CLINICAL DATA:  Fall EXAM: PELVIS - 1 VIEW COMPARISON:  None Available. FINDINGS: Linear sclerosis of the left femoral neck. No pelvic bone lesions are seen. Mild degenerative changes of the bilateral hips. Degenerative changes of the partially visualized lower lumbar spine. Partially visualized left ureter stent. Diffuse demineralization. IMPRESSION: Linear sclerosis of the left femoral neck, concerning for an nondisplaced fracture. Recommend further evaluation with dedicated left hip radiograph with multiple views, alternatively, CT could be performed. Electronically Signed   By: Yetta Glassman M.D.   On: 04/10/2022 09:37   DG Chest Portable 1 View  Result Date: 04/10/2022 CLINICAL DATA:  Unwitnessed fall. EXAM: PORTABLE CHEST 1 VIEW COMPARISON:  Chest radiograph December 16, 2019. FINDINGS: Athero sclerotic calcifications of a tortuous thoracic aorta. Normal size heart. Calcifications of the mitral annulus. Hiatal hernia. Chronic senescent lung  changes. No focal airspace consolidation. No visible pleural effusion or pneumothorax. No acute osseous abnormality. Partially visualized cervical fusion hardware. IMPRESSION: 1. No acute cardiopulmonary disease Electronically Signed   By: Dahlia Bailiff M.D.   On: 04/10/2022 09:36   CT HEAD WO CONTRAST (5MM)  Result Date: 04/10/2022 CLINICAL DATA:  Head trauma, minor (Age >= 65y). EXAM: CT HEAD WITHOUT CONTRAST TECHNIQUE: Contiguous axial images were obtained from the base of the skull through the vertex without intravenous contrast. RADIATION DOSE REDUCTION: This exam was performed according to the departmental dose-optimization program which includes automated exposure control, adjustment of the mA and/or kV according to patient size and/or use of iterative reconstruction  technique. COMPARISON:  CT head 09/05/2021. FINDINGS: Brain: No evidence of acute infarction, hemorrhage, hydrocephalus, extra-axial collection or mass lesion/mass effect. Patchy white matter hypodensities, compatible with chronic microvascular ischemic change. Cerebral atrophy with ex vacuo ventricular dilation. Vascular: Calcific atherosclerosis. Skull: No acute fracture. Sinuses/Orbits: Mild paranasal sinus mucosal thickening. No acute orbital findings. Other: No mastoid effusions. IMPRESSION: No acute intracranial abnormality. Electronically Signed   By: Margaretha Sheffield M.D.   On: 04/10/2022 09:32   CT Cervical Spine Wo Contrast  Result Date: 04/10/2022 CLINICAL DATA:  Neck trauma (Age >= 65y) EXAM: CT CERVICAL SPINE WITHOUT CONTRAST TECHNIQUE: Multidetector CT imaging of the cervical spine was performed without intravenous contrast. Multiplanar CT image reconstructions were also generated. RADIATION DOSE REDUCTION: This exam was performed according to the departmental dose-optimization program which includes automated exposure control, adjustment of the mA and/or kV according to patient size and/or use of iterative reconstruction technique. COMPARISON:  CT 09/05/2021 FINDINGS: Alignment: Reversal of the cervical lordosis. Unchanged grade 1 anterolisthesis at C3-C4 and mild retrolisthesis at C5-C6. Trace anterolisthesis at C7-T1. Skull base and vertebrae: Prior ACDF at C4-C5. Intact hardware without evidence of loosening or fracture. There is no evidence of acute cervical spine fracture. Soft tissues and spinal canal: No prevertebral fluid or swelling. No visible canal hematoma. Disc levels: Unchanged moderate degenerative disc disease with grade 1 anterolisthesis at C3-C4, severe degenerative disc disease at C5-C6, and mild degenerative disc disease at C6-C7. There is multilevel facet arthropathy, severe on the left at C2-C3 and on the right at C7-T1. No high-grade spinal canal or neural foraminal  narrowing. Upper chest: Negative. Other: Emphysema. IMPRESSION: No evidence of acute cervical spine fracture. Unchanged C4-C5 ACDF with multilevel degenerative changes above and below the fusion. Electronically Signed   By: Maurine Simmering M.D.   On: 04/10/2022 09:30    Subjective: Patient was seen and examined at bedside.  Overnight events noted.   Patient reports doing much better.  Patient being discharged to SNF for rehab  Discharge Exam: Vitals:   04/15/22 0729 04/15/22 0859  BP:  (!) 117/90  Pulse: 92 95  Resp: 18 18  Temp:  98 F (36.7 C)  SpO2: 93% 94%   Vitals:   04/14/22 2041 04/15/22 0632 04/15/22 0729 04/15/22 0859  BP:  (!) 157/127  (!) 117/90  Pulse:  93 92 95  Resp:  20 18 18   Temp:  97.8 F (36.6 C)  98 F (36.7 C)  TempSrc:      SpO2: 92% 94% 93% 94%  Weight:      Height:        General: Pt is alert, awake, not in acute distress Cardiovascular: RRR, S1/S2 +, no rubs, no gallops Respiratory: CTA bilaterally, no wheezing, no rhonchi Abdominal: Soft, NT, ND, bowel sounds + Extremities: no edema,  no cyanosis    The results of significant diagnostics from this hospitalization (including imaging, microbiology, ancillary and laboratory) are listed below for reference.     Microbiology: Recent Results (from the past 240 hour(s))  Urine Culture     Status: Abnormal   Collection Time: 04/09/22  1:45 PM   Specimen: Urine   Urine  Result Value Ref Range Status   Urine Culture, Routine Final report (A)  Final   Organism ID, Bacteria Escherichia coli (A)  Final    Comment: Cefazolin <=4 ug/mL Cefazolin with an MIC <=16 predicts susceptibility to the oral agents cefaclor, cefdinir, cefpodoxime, cefprozil, cefuroxime, cephalexin, and loracarbef when used for therapy of uncomplicated urinary tract infections due to E. coli, Klebsiella pneumoniae, and Proteus mirabilis. Greater than 100,000 colony forming units per mL    Antimicrobial Susceptibility Comment  Final     Comment:       ** S = Susceptible; I = Intermediate; R = Resistant **                    P = Positive; N = Negative             MICS are expressed in micrograms per mL    Antibiotic                 RSLT#1    RSLT#2    RSLT#3    RSLT#4 Amoxicillin/Clavulanic Acid    S Ampicillin                     R Cefepime                       S Ceftriaxone                    S Cefuroxime                     S Ciprofloxacin                  S Ertapenem                      S Gentamicin                     S Imipenem                       S Levofloxacin                   S Meropenem                      S Nitrofurantoin                 S Piperacillin/Tazobactam        R Tetracycline                   R Tobramycin                     S Trimethoprim/Sulfa             R   Resp panel by RT-PCR (RSV, Flu A&B, Covid) Anterior Nasal Swab     Status: Abnormal   Collection Time: 04/10/22 11:30 AM   Specimen: Anterior Nasal Swab  Result Value Ref Range Status   SARS Coronavirus 2 by RT PCR  NEGATIVE NEGATIVE Final    Comment: (NOTE) SARS-CoV-2 target nucleic acids are NOT DETECTED.  The SARS-CoV-2 RNA is generally detectable in upper respiratory specimens during the acute phase of infection. The lowest concentration of SARS-CoV-2 viral copies this assay can detect is 138 copies/mL. A negative result does not preclude SARS-Cov-2 infection and should not be used as the sole basis for treatment or other patient management decisions. A negative result may occur with  improper specimen collection/handling, submission of specimen other than nasopharyngeal swab, presence of viral mutation(s) within the areas targeted by this assay, and inadequate number of viral copies(<138 copies/mL). A negative result must be combined with clinical observations, patient history, and epidemiological information. The expected result is Negative.  Fact Sheet for Patients:   EntrepreneurPulse.com.au  Fact Sheet for Healthcare Providers:  IncredibleEmployment.be  This test is no t yet approved or cleared by the [place] FDA and  has been authorized for detection and/or diagnosis of SARS-CoV-2 by FDA under an Emergency Use Authorization (EUA). This EUA will remain  in effect (meaning this test can be used) for the duration of the COVID-19 declaration under Section 564(b)(1) of the Act, 21 U.S.C.section 360bbb-3(b)(1), unless the authorization is terminated  or revoked sooner.       Influenza A by PCR NEGATIVE NEGATIVE Final   Influenza B by PCR POSITIVE (A) NEGATIVE Final    Comment: (NOTE) The Xpert Xpress SARS-CoV-2/FLU/RSV plus assay is intended as an aid in the diagnosis of influenza from Nasopharyngeal swab specimens and should not be used as a sole basis for treatment. Nasal washings and aspirates are unacceptable for Xpert Xpress SARS-CoV-2/FLU/RSV testing.  Fact Sheet for Patients: EntrepreneurPulse.com.au  Fact Sheet for Healthcare Providers: IncredibleEmployment.be  This test is not yet approved or cleared by the [place] FDA and has been authorized for detection and/or diagnosis of SARS-CoV-2 by FDA under an Emergency Use Authorization (EUA). This EUA will remain in effect (meaning this test can be used) for the duration of the COVID-19 declaration under Section 564(b)(1) of the Act, 21 U.S.C. section 360bbb-3(b)(1), unless the authorization is terminated or revoked.     Resp Syncytial Virus by PCR NEGATIVE NEGATIVE Final    Comment: (NOTE) Fact Sheet for Patients: EntrepreneurPulse.com.au  Fact Sheet for Healthcare Providers: IncredibleEmployment.be  This test is not yet approved or cleared by the [place] FDA and has been authorized for detection and/or diagnosis of SARS-CoV-2 by FDA under an Emergency Use  Authorization (EUA). This EUA will remain in effect (meaning this test can be used) for the duration of the COVID-19 declaration under Section 564(b)(1) of the Act, 21 U.S.C. section 360bbb-3(b)(1), unless the authorization is terminated or revoked.  Performed at [facility], [address] [place], [place] [address]   MRSA Next Gen by PCR, Nasal     Status: None   Collection Time: 04/10/22  9:25 PM   Specimen: Nasal Mucosa; Nasal Swab  Result Value Ref Range Status   MRSA by PCR Next Gen NOT DETECTED NOT DETECTED Final    Comment: (NOTE) The GeneXpert MRSA Assay (FDA approved for NASAL specimens only), is one component of a comprehensive MRSA colonization surveillance program. It is not intended to diagnose MRSA infection nor to guide or monitor treatment for MRSA infections. Test performance is not FDA approved in patients less than 21 years old. Performed at [facility], [address] [place], [place] [address]      Labs: BNP (last 3 results) No results  for input(s): "BNP" in the last 8760 hours. Basic Metabolic Panel: Recent Labs  Lab 04/11/22 0744 04/12/22 0642 04/13/22 0422 04/14/22 0320 04/15/22 0615  NA 124* 130* 131* 126* 129*  K 3.4* 3.3* 3.3* 3.6 3.4*  CL 92* 94* 98 95* 93*  CO2 25 27 26 24 24   GLUCOSE 134* 106* 104* 104* 101*  BUN 17 17 15 12 14   CREATININE 0.81 0.70 0.75 0.67 0.76  CALCIUM 8.2* 8.3* 7.8* 7.8* 8.6*  MG  --  1.8  --   --  1.8  PHOS  --  2.6  --   --  3.3   Liver Function Tests: Recent Labs  Lab 04/10/22 0837  AST 23  ALT 16  ALKPHOS 71  BILITOT 0.7  PROT 8.0  ALBUMIN 4.1   No results for input(s): "LIPASE", "AMYLASE" in the last 168 hours. No results for input(s): "AMMONIA" in the last 168 hours. CBC: Recent Labs  Lab 04/10/22 0837 04/11/22 0744 04/12/22 0642 04/13/22 0422 04/14/22 0320  WBC 11.6* 5.6 5.3 4.4 6.0  NEUTROABS 9.4*  --   --   --   --   HGB 13.4 12.3 10.9* 10.2* 10.3*  HCT 41.3  36.6 32.5* 31.3* 31.5*  MCV 85.3 83.6 83.3 84.6 84.0  PLT 362 313 293 301 286   Cardiac Enzymes: Recent Labs  Lab 04/10/22 0837  CKTOTAL 53   BNP: Invalid input(s): "POCBNP" CBG: No results for input(s): "GLUCAP" in the last 168 hours. D-Dimer No results for input(s): "DDIMER" in the last 72 hours. Hgb A1c No results for input(s): "HGBA1C" in the last 72 hours. Lipid Profile No results for input(s): "CHOL", "HDL", "LDLCALC", "TRIG", "CHOLHDL", "LDLDIRECT" in the last 72 hours. Thyroid function studies No results for input(s): "TSH", "T4TOTAL", "T3FREE", "THYROIDAB" in the last 72 hours.  Invalid input(s): "FREET3" Anemia work up No results for input(s): "VITAMINB12", "FOLATE", "FERRITIN", "TIBC", "IRON", "RETICCTPCT" in the last 72 hours. Urinalysis    Component Value Date/Time   COLORURINE YELLOW (A) 04/10/2022 0840   APPEARANCEUR CLOUDY (A) 04/10/2022 0840   APPEARANCEUR Cloudy (A) 03/07/2022 1332   LABSPEC 1.015 04/10/2022 0840   LABSPEC 1.010 01/09/2013 0647   PHURINE 5.0 04/10/2022 0840   GLUCOSEU NEGATIVE 04/10/2022 0840   GLUCOSEU Negative 01/09/2013 0647   HGBUR MODERATE (A) 04/10/2022 0840   BILIRUBINUR NEGATIVE 04/10/2022 0840   BILIRUBINUR Negative 04/09/2022 1452   BILIRUBINUR Negative 03/07/2022 1332   BILIRUBINUR Negative 01/09/2013 0647   KETONESUR 5 (A) 04/10/2022 0840   PROTEINUR 100 (A) 04/10/2022 0840   UROBILINOGEN 0.2 04/09/2022 1452   NITRITE NEGATIVE 04/10/2022 0840   LEUKOCYTESUR LARGE (A) 04/10/2022 0840   LEUKOCYTESUR 2+ 01/09/2013 0647   Sepsis Labs Recent Labs  Lab 04/11/22 0744 04/12/22 0642 04/13/22 0422 04/14/22 0320  WBC 5.6 5.3 4.4 6.0   Microbiology Recent Results (from the past 240 hour(s))  Urine Culture     Status: Abnormal   Collection Time: 04/09/22  1:45 PM   Specimen: Urine   Urine  Result Value Ref Range Status   Urine Culture, Routine Final report (A)  Final   Organism ID, Bacteria Escherichia coli (A)  Final     Comment: Cefazolin <=4 ug/mL Cefazolin with an MIC <=16 predicts susceptibility to the oral agents cefaclor, cefdinir, cefpodoxime, cefprozil, cefuroxime, cephalexin, and loracarbef when used for therapy of uncomplicated urinary tract infections due to E. coli, Klebsiella pneumoniae, and Proteus mirabilis. Greater than 100,000 colony forming units per mL    Antimicrobial  Susceptibility Comment  Final    Comment:       ** S = Susceptible; I = Intermediate; R = Resistant **                    P = Positive; N = Negative             MICS are expressed in micrograms per mL    Antibiotic                 RSLT#1    RSLT#2    RSLT#3    RSLT#4 Amoxicillin/Clavulanic Acid    S Ampicillin                     R Cefepime                       S Ceftriaxone                    S Cefuroxime                     S Ciprofloxacin                  S Ertapenem                      S Gentamicin                     S Imipenem                       S Levofloxacin                   S Meropenem                      S Nitrofurantoin                 S Piperacillin/Tazobactam        R Tetracycline                   R Tobramycin                     S Trimethoprim/Sulfa             R   Resp panel by RT-PCR (RSV, Flu A&B, Covid) Anterior Nasal Swab     Status: Abnormal   Collection Time: 04/10/22 11:30 AM   Specimen: Anterior Nasal Swab  Result Value Ref Range Status   SARS Coronavirus 2 by RT PCR NEGATIVE NEGATIVE Final    Comment: (NOTE) SARS-CoV-2 target nucleic acids are NOT DETECTED.  The SARS-CoV-2 RNA is generally detectable in upper respiratory specimens during the acute phase of infection. The lowest concentration of SARS-CoV-2 viral copies this assay can detect is 138 copies/mL. A negative result does not preclude SARS-Cov-2 infection and should not be used as the sole basis for treatment or other patient management decisions. A negative result may occur with  improper specimen  collection/handling, submission of specimen other than nasopharyngeal swab, presence of viral mutation(s) within the areas targeted by this assay, and inadequate number of viral copies(<138 copies/mL). A negative result must be combined with clinical observations, patient history, and epidemiological information. The expected result is Negative.  Fact Sheet for Patients:  EntrepreneurPulse.com.au  Fact Sheet for Healthcare Providers:  IncredibleEmployment.be  This test  is no t yet approved or cleared by the [place] and  has been authorized for detection and/or diagnosis of SARS-CoV-2 by FDA under an Emergency Use Authorization (EUA). This EUA will remain  in effect (meaning this test can be used) for the duration of the COVID-19 declaration under Section 564(b)(1) of the Act, 21 U.S.C.section 360bbb-3(b)(1), unless the authorization is terminated  or revoked sooner.       Influenza A by PCR NEGATIVE NEGATIVE Final   Influenza B by PCR POSITIVE (A) NEGATIVE Final    Comment: (NOTE) The Xpert Xpress SARS-CoV-2/FLU/RSV plus assay is intended as an aid in the diagnosis of influenza from Nasopharyngeal swab specimens and should not be used as a sole basis for treatment. Nasal washings and aspirates are unacceptable for Xpert Xpress SARS-CoV-2/FLU/RSV testing.  Fact Sheet for Patients: EntrepreneurPulse.com.au  Fact Sheet for Healthcare Providers: IncredibleEmployment.be  This test is not yet approved or cleared by the [place] FDA and has been authorized for detection and/or diagnosis of SARS-CoV-2 by FDA under an Emergency Use Authorization (EUA). This EUA will remain in effect (meaning this test can be used) for the duration of the COVID-19 declaration under Section 564(b)(1) of the Act, 21 U.S.C. section 360bbb-3(b)(1), unless the authorization is terminated or revoked.     Resp Syncytial  Virus by PCR NEGATIVE NEGATIVE Final    Comment: (NOTE) Fact Sheet for Patients: EntrepreneurPulse.com.au  Fact Sheet for Healthcare Providers: IncredibleEmployment.be  This test is not yet approved or cleared by the [place] FDA and has been authorized for detection and/or diagnosis of SARS-CoV-2 by FDA under an Emergency Use Authorization (EUA). This EUA will remain in effect (meaning this test can be used) for the duration of the COVID-19 declaration under Section 564(b)(1) of the Act, 21 U.S.C. section 360bbb-3(b)(1), unless the authorization is terminated or revoked.  Performed at [facility], [address] [place], [place] [address]   MRSA Next Gen by PCR, Nasal     Status: None   Collection Time: 04/10/22  9:25 PM   Specimen: Nasal Mucosa; Nasal Swab  Result Value Ref Range Status   MRSA by PCR Next Gen NOT DETECTED NOT DETECTED Final    Comment: (NOTE) The GeneXpert MRSA Assay (FDA approved for NASAL specimens only), is one component of a comprehensive MRSA colonization surveillance program. It is not intended to diagnose MRSA infection nor to guide or monitor treatment for MRSA infections. Test performance is not FDA approved in patients less than 30 years old. Performed at [facility], [address] [place], [place] [address]    Time coordinating discharge: Over 30 minutes  SIGNED:   Duard Brady, MD  [facility] 04/15/2022, 10:35 AM Pager   If 7PM-7AM, please contact night-coverage

## 2022-04-15 NOTE — Progress Notes (Signed)
Called report to [facility]. Report provided to [facility], [place].  All questions addressed at time of call.

## 2022-04-17 ENCOUNTER — Telehealth: Payer: Self-pay

## 2022-04-17 NOTE — Telephone Encounter (Signed)
She saw dr b on 3/12. There is no mention of hypothyroidism.

## 2022-04-17 NOTE — Telephone Encounter (Signed)
Phone continued to ring. Ok for [facility] to advise if patient or POA calls back

## 2022-04-17 NOTE — Telephone Encounter (Signed)
Copied from [place] [id number]. Topic: General - Other >> Apr 16, 2022  4:53 PM [place] B wrote: Reason for CRM: poa on account, [name] called in about patient being dx with hypthyroidism on 03/12 and she is wanting to know if this is true. Please call back

## 2022-06-05 ENCOUNTER — Ambulatory Visit (INDEPENDENT_AMBULATORY_CARE_PROVIDER_SITE_OTHER): Payer: Medicare Other | Admitting: Urology

## 2022-06-05 ENCOUNTER — Encounter: Payer: Self-pay | Admitting: Urology

## 2022-06-05 VITALS — BP 141/85 | HR 91 | Ht 63.0 in | Wt 141.0 lb

## 2022-06-05 DIAGNOSIS — Z87440 Personal history of urinary (tract) infections: Secondary | ICD-10-CM | POA: Diagnosis not present

## 2022-06-05 DIAGNOSIS — R300 Dysuria: Secondary | ICD-10-CM

## 2022-06-05 DIAGNOSIS — N390 Urinary tract infection, site not specified: Secondary | ICD-10-CM

## 2022-06-05 DIAGNOSIS — R410 Disorientation, unspecified: Secondary | ICD-10-CM

## 2022-06-05 DIAGNOSIS — R82998 Other abnormal findings in urine: Secondary | ICD-10-CM | POA: Diagnosis not present

## 2022-06-05 LAB — URINALYSIS, COMPLETE
Bilirubin, UA: NEGATIVE
Glucose, UA: NEGATIVE
Ketones, UA: NEGATIVE
Nitrite, UA: NEGATIVE
Specific Gravity, UA: 1.02 (ref 1.005–1.030)
Urobilinogen, Ur: 0.2 mg/dL (ref 0.2–1.0)
pH, UA: 5.5 (ref 5.0–7.5)

## 2022-06-05 LAB — MICROSCOPIC EXAMINATION: WBC, UA: >30 /hpf — AB (ref 0–5)

## 2022-06-05 MED ORDER — CEFUROXIME AXETIL 500 MG PO TABS: 500.0000 mg | ORAL_TABLET | Freq: Two times a day (BID) | ORAL | 0 refills | Status: DC

## 2022-06-05 NOTE — Progress Notes (Signed)
06/05/2022 8:08 PM   Monica Singh [birth year] [id number]  Referring provider: Malva Limes, MD [address] [place],  [place] [address]  Urological history: 1. Left UPJ obstruction -creatinine (08/2021) 0.92 -managed with an indwelling Optima Bard ureteral stent exchanged yearly -last exchange 05/2021 -Diagnostic ureteroscopy in January 2020 did not show any concerning lesions and cytology was negative  2. rUTI's -contributing factors of age, indwelling stent and vaginal atrophy -documented urine cultures over the last year  April 09, 2022 for E. coli  March 07, 2022 for E. coli   February 07, 2022 for E. coli  January 15, 2022 E. coli and Pseudomonas aeruginosa  January 03, 2022 E. Coli  December 04, 2021 E. Coli  November 08, 2021 E. coli  September 28, 2021 E. Coli  August 28, 2021 E. Coli  July 03, 2021 E. Coli -Hiprex 1 gram BID     Chief Complaint  Patient presents with   Recurrent UTI      HPI: Monica Singh is a 89 y.o. female who presents for possible UTI with cloudy urine and confusion with her daughter, [name].      Her daughter states they have noticed confusion, cloudy malodorous urine and complaints of dysuria.  She was found on the floor this morning at [facility] when they went to bring her down for breakfast.   Patient denies any modifying or aggravating factors.  Patient denies any gross hematuria, dysuria or suprapubic/flank pain.  Patient denies any fevers, chills, nausea or vomiting.    UA yellow cloudy, specific gravity 1.020, 2+ blood, pH 5.5, 1+ protein, 3+ leukocyte, greater than 30 WBCs, 11-30 RBCs and many bacteria.   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 lat. elbow. SCCis arising in AK. Excised 01/04/2009, margins free.    Squamous cell carcinoma of skin 07/26/2020   right hand   Syncope     Surgical History: Past Surgical History:  Procedure Laterality Date   ABDOMINAL SURGERY  2009   abdominal mass+ NH lymphoma,   BACK SURGERY  01/2017   fusion. metal plate in neck at back   CATARACT EXTRACTION  1997   right eye and left ey   cervical neck fusion  1995   CYSTOSCOPY W/ RETROGRADES Left 01/30/2018   Procedure: CYSTOSCOPY WITH RETROGRADE PYELOGRAM;  Surgeon: Sondra Come, MD;  Location: [facility];  Service: Urology;  Laterality: Left;   CYSTOSCOPY W/ RETROGRADES Left 08/05/2018   Procedure: CYSTOSCOPY WITH RETROGRADE PYELOGRAM;  Surgeon: Sondra Come, MD;  Location: [facility];  Service: Urology;  Laterality:  Left;   CYSTOSCOPY W/ RETROGRADES Left 06/01/2021   Procedure: CYSTOSCOPY WITH RETROGRADE PYELOGRAM;  Surgeon: Sondra Come, MD;  Location: [facility];  Service: Urology;  Laterality: Left;   CYSTOSCOPY W/ URETERAL STENT PLACEMENT Left 08/05/2018   Procedure: CYSTOSCOPY WITH STENT Exchange;  Surgeon: Sondra Come, MD;  Location: [facility];  Service: Urology;  Laterality: Left;   CYSTOSCOPY W/ URETERAL STENT PLACEMENT Left 05/21/2019   Procedure: CYSTOSCOPY WITH STENT REPLACEMENT;  Surgeon: Sondra Come, MD;  Location: [facility];  Service: Urology;  Laterality: Left;   CYSTOSCOPY W/ URETERAL STENT PLACEMENT Left 05/05/2020    Procedure: CYSTOSCOPY WITH RETROGRADE PYELOGRAM/URETERAL STENT EXCHANGE;  Surgeon: Sondra Come, MD;  Location: [facility];  Service: Urology;  Laterality: Left;   CYSTOSCOPY W/ URETERAL STENT PLACEMENT Left 06/01/2021   Procedure: CYSTOSCOPY WITH STENT EXCHANGE;  Surgeon: Sondra Come, MD;  Location: [facility];  Service: Urology;  Laterality: Left;   CYSTOSCOPY WITH BIOPSY Left 02/27/2018   Procedure: CYSTOSCOPY WITH BIOPSY;  Surgeon: Sondra Come, MD;  Location: [facility];  Service: Urology;  Laterality: Left;   CYSTOSCOPY WITH STENT PLACEMENT Left 01/30/2018   Procedure: CYSTOSCOPY WITH STENT PLACEMENT;  Surgeon: Sondra Come, MD;  Location: [facility];  Service: Urology;  Laterality: Left;   CYSTOSCOPY WITH URETEROSCOPY AND STENT PLACEMENT Left 02/27/2018   Procedure: CYSTOSCOPY WITH URETEROSCOPY AND STENT Exchange;  Surgeon: Sondra Come, MD;  Location: [facility];  Service: Urology;  Laterality: Left;   DENTAL SURGERY     screws   JOINT REPLACEMENT Left    knee   KNEE ARTHROPLASTY Right 12/23/2018   Procedure: RIGHT COMPUTER ASSISTED TOTAL KNEE ARTHROPLASTY;  Surgeon: Donato Heinz, MD;  Location: [facility];  Service: Orthopedics;  Laterality: Right;   KYPHOPLASTY N/A 02/21/2017   Procedure: [id number];  Surgeon: Kennedy Bucker, MD;  Location: [facility];  Service: Orthopedics;  Laterality: N/A;   laparotomy with biopsy  03/02/2007   PORTACATH PLACEMENT  2009   SPINE SURGERY  1997   SQUAMOUS CELL CARCINOMA EXCISION     right arm   TOTAL KNEE ARTHROPLASTY Left    2009   URETEROSCOPY Left 01/30/2018   Procedure: URETEROSCOPY;  Surgeon: Sondra Come, MD;  Location: [facility];  Service: Urology;  Laterality: Left;   VAGINAL HYSTERECTOMY  1971    Home Medications:  Allergies as of 06/05/2022       Reactions   Ciprofloxacin Itching   Whelps on face reported 03/16/2021   Milk-related Compounds Diarrhea, Nausea And Vomiting   Gas too   Latex Itching   Misc. Sulfonamide  Containing Compounds    Milk (cow) Diarrhea, Nausea And Vomiting   Milk allergy   Sulfa Antibiotics Hives, Itching        Medication List        Accurate as of Jun 05, 2022 11:59 PM. If you have any questions, ask your nurse or doctor.          Align 4 MG Caps Take 1 capsule (4 mg total) by mouth daily.   amLODipine 5 MG tablet Commonly known as: NORVASC Take 1 tablet (5 mg total) by mouth daily.   cefUROXime 500 MG tablet Commonly known as: CEFTIN Take 1 tablet (500 mg total) by mouth 2 (two) times daily with a meal. Started by: Cherae Marton, [name]   Combivent Respimat 20-100 MCG/ACT Aers respimat Generic drug: Ipratropium-Albuterol INHALE 2  PUFFS INTO THE LUNGS 3 TIMES DAILY  DELSYM COUGH RELIEF MT Use as directed in the mouth or throat.   FOLIC ACID PO Take by mouth.   ibuprofen 200 MG tablet Commonly known as: ADVIL Take 400 mg by mouth every 6 (six) hours as needed.   methenamine 1 g tablet Commonly known as: Hiprex Take 1 tablet (1 g total) by mouth 2 (two) times daily with a meal.   psyllium 95 % Pack Commonly known as: HYDROCIL/METAMUCIL Take 1 packet by mouth daily.   VITAMIN B-12 ER PO Take by mouth.        Allergies:  Allergies  Allergen Reactions   Ciprofloxacin Itching    Whelps on face reported 03/16/2021   Milk-Related Compounds Diarrhea and Nausea And Vomiting    Gas too   Latex Itching   Misc. Sulfonamide Containing Compounds    Milk (Cow) Diarrhea and Nausea And Vomiting    Milk allergy    Sulfa Antibiotics Hives and Itching    Family History: Family History  Problem Relation Age of Onset   Breast cancer Sister    Dementia Sister    Cataracts Sister    Heart attack Brother    CAD Brother    Heart attack Brother    Leukemia Grandchild        granddaughter   Kidney disease Neg Hx    Bladder Cancer Neg Hx     Social History:  reports that she quit smoking about 32 years ago. Her smoking use included cigarettes.  She smoked an average of .25 packs per day. She has been exposed to tobacco smoke. She has never used smokeless tobacco. She reports that she does not currently use alcohol. She reports that she does not use drugs.  ROS: Pertinent ROS in HPI  Physical Exam: BP (!) 141/85   Pulse 91   Ht 5\' 3"  (1.6 m)   Wt 141 lb (64 kg)   BMI 24.98 kg/m   Constitutional:  Well nourished. Alert and oriented, No acute distress. HEENT: [place] AT, moist mucus membranes.  Trachea midline Cardiovascular: No clubbing, cyanosis, or edema. Respiratory: Normal respiratory effort, no increased work of breathing. Neurologic: Grossly intact, no focal deficits, moving all 4 extremities. Psychiatric: Normal mood and affect.    Laboratory Data: Urinalysis See Epic and HPI I have reviewed the labs.   Pertinent Imaging: N/A   Assessment & Plan:    1. Suspected UTI -UA grossly infected -Urine is sent for culture -Start on Ceftin - will adjust once culture is available if necessary  2. rUTI's -Continue Hiprex 1 g twice daily  Return for Keep appointment with Dr. Richardo Hanks.  These notes generated with voice recognition software. I apologize for typographical errors.  Cloretta Ned  [facility] [address]  [address] [place], [place] [address] [phone]

## 2022-06-08 LAB — CULTURE, URINE COMPREHENSIVE

## 2022-06-19 ENCOUNTER — Ambulatory Visit (INDEPENDENT_AMBULATORY_CARE_PROVIDER_SITE_OTHER): Payer: Medicare Other | Admitting: Dermatology

## 2022-06-19 VITALS — BP 122/69 | HR 98

## 2022-06-19 DIAGNOSIS — I872 Venous insufficiency (chronic) (peripheral): Secondary | ICD-10-CM

## 2022-06-19 DIAGNOSIS — L219 Seborrheic dermatitis, unspecified: Secondary | ICD-10-CM

## 2022-06-19 DIAGNOSIS — L820 Inflamed seborrheic keratosis: Secondary | ICD-10-CM | POA: Diagnosis not present

## 2022-06-19 DIAGNOSIS — L821 Other seborrheic keratosis: Secondary | ICD-10-CM | POA: Diagnosis not present

## 2022-06-19 DIAGNOSIS — Z85828 Personal history of other malignant neoplasm of skin: Secondary | ICD-10-CM

## 2022-06-19 MED ORDER — HYDROCORTISONE 2.5 % EX CREA: TOPICAL_CREAM | CUTANEOUS | 3 refills | Status: DC

## 2022-06-19 MED ORDER — KETOCONAZOLE 2 % EX CREA: TOPICAL_CREAM | CUTANEOUS | 3 refills | Status: DC

## 2022-06-19 NOTE — Patient Instructions (Addendum)
Seborrheic Dermatitis Mix hydrocortisone with ketaconazole 2% twice a day to pink, scaly areas on face. If improved, decrease to hydrocortisone and ketaconazole mixed once a day. If still clear, decrease to ketaconazole only.  Stasis Dermatitis Stasis in the legs causes chronic leg swelling, which may result in itchy or painful rashes, skin discoloration, skin texture changes, and sometimes ulceration.  Recommend daily graduated compression hose/stockings- easiest to put on first thing in morning, remove at bedtime.  Elevate legs as much as possible. Avoid salt/sodium rich foods.  May use triamcinolone 0.1% cream to itchy rash on legs once to twice daily until improved. Topical steroids (such as triamcinolone, fluocinolone, fluocinonide, mometasone, clobetasol, halobetasol, betamethasone, hydrocortisone) can cause thinning and lightening of the skin if they are used for too long in the same area. Your physician has selected the right strength medicine for your problem and area affected on the body. Please use your medication only as directed by your physician to prevent side effects.   Cryotherapy Aftercare  Wash gently with soap and water everyday.   Apply Vaseline and Band-Aid daily until healed.   Seborrheic Keratosis  What causes seborrheic keratoses? Seborrheic keratoses are harmless, common skin growths that first appear during adult life.  As time goes by, more growths appear.  Some people may develop a large number of them.  Seborrheic keratoses appear on both covered and uncovered body parts.  They are not caused by sunlight.  The tendency to develop seborrheic keratoses can be inherited.  They vary in color from skin-colored to gray, brown, or even black.  They can be either smooth or have a rough, warty surface.   Seborrheic keratoses are superficial and look as if they were stuck on the skin.  Under the microscope this type of keratosis looks like layers upon layers of skin.  That is  why at times the top layer may seem to fall off, but the rest of the growth remains and re-grows.    Treatment Seborrheic keratoses do not need to be treated, but can easily be removed in the office.  Seborrheic keratoses often cause symptoms when they rub on clothing or jewelry.  Lesions can be in the way of shaving.  If they become inflamed, they can cause itching, soreness, or burning.  Removal of a seborrheic keratosis can be accomplished by freezing, burning, or surgery. If any spot bleeds, scabs, or grows rapidly, please return to have it checked, as these can be an indication of a skin cancer. Due to recent changes in healthcare laws, you may see results of your pathology and/or laboratory studies on MyChart before the doctors have had a chance to review them. We understand that in some cases there may be results that are confusing or concerning to you. Please understand that not all results are received at the same time and often the doctors may need to interpret multiple results in order to provide you with the best plan of care or course of treatment. Therefore, we ask that you please give [place] 2 business days to thoroughly review all your results before contacting the office for clarification. Should we see a critical lab result, you will be contacted sooner.   If You Need Anything After Your Visit  If you have any questions or concerns for your doctor, please call our main line at [phone] and press option 4 to reach your doctor's medical assistant. If no one answers, please leave a voicemail as directed and we will return your call  as soon as possible. Messages left after 4 pm will be answered the following business day.   You may also send [place] a message via MyChart. We typically respond to MyChart messages within 1-2 business days.  For prescription refills, please ask your pharmacy to contact our office. Our fax number is [phone].  If you have an urgent issue when the clinic is  closed that cannot wait until the next business day, you can page your doctor at the number below.    Please note that while we do our best to be available for urgent issues outside of office hours, we are not available 24/7.   If you have an urgent issue and are unable to reach [place], you may choose to seek medical care at your doctor's office, retail clinic, urgent care center, or emergency room.  If you have a medical emergency, please immediately call 911 or go to the emergency department.  Pager Numbers  - Dr. Gwen Pounds: [phone]  - Dr. Neale Burly: [phone]  - Dr. Roseanne Reno: [phone]  In the event of inclement weather, please call our main line at [phone] for an update on the status of any delays or closures.  Dermatology Medication Tips: Please keep the boxes that topical medications come in in order to help keep track of the instructions about where and how to use these. Pharmacies typically print the medication instructions only on the boxes and not directly on the medication tubes.   If your medication is too expensive, please contact our office at [phone] option 4 or send [place] a message through MyChart.   We are unable to tell what your co-pay for medications will be in advance as this is different depending on your insurance coverage. However, we may be able to find a substitute medication at lower cost or fill out paperwork to get insurance to cover a needed medication.   If a prior authorization is required to get your medication covered by your insurance company, please allow [place] 1-2 business days to complete this process.  Drug prices often vary depending on where the prescription is filled and some pharmacies may offer cheaper prices.  The website www.goodrx.com contains coupons for medications through different pharmacies. The prices here do not account for what the cost may be with help from insurance (it may be cheaper with your insurance), but the website can  give you the price if you did not use any insurance.  - You can print the associated coupon and take it with your prescription to the pharmacy.  - You may also stop by our office during regular business hours and pick up a GoodRx coupon card.  - If you need your prescription sent electronically to a different pharmacy, notify our office through [facility] or by phone at [phone] option 4.     Si Usted Necesita Algo Despus de Su Visita  Tambin puede enviarnos un mensaje a travs de Clinical cytogeneticist. Por lo general respondemos a los mensajes de MyChart en el transcurso de 1 a 2 das hbiles.  Para renovar recetas, por favor pida a su farmacia que se ponga en contacto con nuestra oficina. Annie Sable de fax es [place] [phone].  Si tiene un asunto urgente cuando la clnica est cerrada y que no puede esperar hasta el siguiente da hbil, puede llamar/localizar a su doctor(a) al nmero que aparece a continuacin.   Por favor, tenga en cuenta que aunque hacemos todo lo posible para estar disponibles para asuntos urgentes fuera del  horario de oficina, no estamos disponibles las 24 horas del da, los 7 [address]  [address] de la [place].   Si tiene un problema urgente y no puede comunicarse con nosotros, puede optar por buscar atencin mdica  en el consultorio de su doctor(a), en una clnica privada, en un centro de atencin urgente o en una sala de emergencias.  Si tiene Engineer, drilling, por favor llame inmediatamente al 911 o vaya a la sala de emergencias.  Nmeros de bper  - Dr. Gwen Pounds: [phone]  - Dra. Moye: [phone]  - Dra. Roseanne Reno: [phone]  En caso de inclemencias del [place], por favor llame a Lacy Duverney principal al [phone] para una actualizacin sobre el [place] de cualquier retraso o cierre.  Consejos para la medicacin en dermatologa: Por favor, guarde las cajas en las que vienen los medicamentos de uso tpico para ayudarle a seguir las instrucciones sobre  dnde y cmo usarlos. Las farmacias generalmente imprimen las instrucciones del medicamento slo en las cajas y no directamente en los tubos del [place].   Si su medicamento es muy caro, por favor, pngase en contacto con Rolm Gala llamando al [phone] y presione la opcin 4 o envenos un mensaje a travs de Clinical cytogeneticist.   No podemos decirle cul ser su copago por los medicamentos por adelantado ya que esto es diferente dependiendo de la cobertura de su seguro. Sin embargo, es posible que podamos encontrar un medicamento sustituto a Audiological scientist un formulario para que el seguro cubra el medicamento que se considera necesario.   Si se requiere una autorizacin previa para que su compaa de seguros [place] su medicamento, por favor permtanos de 1 a 2 das hbiles para completar [address]  Los precios de los medicamentos varan con frecuencia dependiendo del Environmental consultant de dnde se surte la receta y alguna farmacias pueden ofrecer precios ms baratos.  El sitio web www.goodrx.com tiene cupones para medicamentos de Health and safety inspector. Los precios aqu no tienen en cuenta lo que podra costar con la ayuda del seguro (puede ser ms barato con su seguro), pero el sitio web puede darle el precio si no utiliz Tourist information centre manager.  - Puede imprimir el cupn correspondiente y llevarlo con su receta a la farmacia.  - Tambin puede pasar por nuestra oficina durante el horario de atencin regular y Education officer, museum una tarjeta de cupones de GoodRx.  - Si necesita que su receta se enve electrnicamente a una farmacia diferente, informe a nuestra oficina a travs de MyChart de [place] o por telfono llamando al [phone] y presione la opcin 4.

## 2022-06-19 NOTE — Progress Notes (Signed)
Follow-Up Visit   Subjective  Monica Singh is a 89 y.o. female who presents for the following: Spot on the back bra line, itchy. Noticed several weeks ago. She also has red, scaly spots on the face and has been using TMC 0.1% Cream on for several months. She also has rash on the lower legs.  Patient accompanied by daughter, who contributes to history.  The following portions of the chart were reviewed this encounter and updated as appropriate: medications, allergies, medical history  Review of Systems:  No other skin or systemic complaints except as noted in HPI or Assessment and Plan.  Objective  Well appearing patient in no apparent distress; mood and affect are within normal limits.  A focused examination was performed of the following areas: Face, back  Relevant physical exam findings are noted in the Assessment and Plan.  Right Mid Back Erythematous stuck-on, waxy papule or plaque    Assessment & Plan  SEBORRHEIC KERATOSIS - Stuck-on, waxy, tan-brown papules and/or plaques  - Benign-appearing - Discussed benign etiology and prognosis. - Observe - Call for any changes  SEBORRHEIC DERMATITIS Exam: Pink patches with greasy scale at perinasal, perioral, glabella  Chronic and persistent condition with duration or expected duration over one year. Condition is bothersome/symptomatic for patient. Currently flared.   Seborrheic Dermatitis is a chronic persistent rash characterized by pinkness and scaling most commonly of the mid face but also can occur on the scalp (dandruff), ears; mid chest, mid back and groin.  It tends to be exacerbated by stress and cooler weather.  People who have neurologic disease may experience new onset or exacerbation of existing seborrheic dermatitis.  The condition is not curable but treatable and can be controlled.  Treatment Plan: d/c TMC 0.1% cream, can cause skin thinning on face  Mix hydrocortisone with ketaconazole 2% twice a day.  If improved, decrease to hydrocortisone and ketaconazole mixed once a day. If still clear, decrease to ketaconazole only. Rxs sent to pharmacy.   STASIS DERMATITIS Exam: Erythematous, scaly patches involving the ankle and distal lower leg with associated lower leg edema.  Chronic and persistent condition with duration or expected duration over one year. Condition is bothersome/symptomatic for patient. Currently flared.   Stasis in the legs causes chronic leg swelling, which may result in itchy or painful rashes, skin discoloration, skin texture changes, and sometimes ulceration.  Recommend daily graduated compression hose/stockings- easiest to put on first thing in morning, remove at bedtime.  Elevate legs as much as possible. Avoid salt/sodium rich foods.  Treatment Plan: TMC 0.1% Cream QD/BID prn itchy rash on legs. Pt has Rx.   Topical steroids (such as triamcinolone, fluocinolone, fluocinonide, mometasone, clobetasol, halobetasol, betamethasone, hydrocortisone) can cause thinning and lightening of the skin if they are used for too long in the same area. Your physician has selected the right strength medicine for your problem and area affected on the body. Please use your medication only as directed by your physician to prevent side effects.   Recommend moisturizer to legs daily. Recommend daily compression hose as tolerated, apply first thing in AM.   Inflamed seborrheic keratosis Right Mid Back  Symptomatic, irritating, patient would like treated.  Destruction of lesion - Right Mid Back  Destruction method: cryotherapy   Informed consent: discussed and consent obtained   Lesion destroyed using liquid nitrogen: Yes   Region frozen until ice ball extended beyond lesion: Yes   Outcome: patient tolerated procedure well with no complications   Post-procedure  details: wound care instructions given   Additional details:  Prior to procedure, discussed risks of blister formation, small  wound, skin dyspigmentation, or rare scar following cryotherapy. Recommend Vaseline ointment to treated areas while healing.   HISTORY OF SQUAMOUS CELL CARCINOMA OF THE SKIN - No evidence of recurrence today - Recommend regular full body skin exams - Recommend daily broad spectrum sunscreen SPF 30+ to sun-exposed areas, reapply every 2 hours as needed.  - Call if any new or changing lesions are noted between office visits    Return if symptoms worsen or fail to improve.  ICherlyn Labella, CMA, am acting as scribe for Willeen Niece, MD .   Documentation: I have reviewed the above documentation for accuracy and completeness, and I agree with the above.  Willeen Niece, MD

## 2022-06-25 ENCOUNTER — Ambulatory Visit (INDEPENDENT_AMBULATORY_CARE_PROVIDER_SITE_OTHER): Payer: Medicare Other | Admitting: Urology

## 2022-06-25 ENCOUNTER — Other Ambulatory Visit
Admission: RE | Admit: 2022-06-25 | Discharge: 2022-06-25 | Disposition: A | Discharge status: Home / Self Care | Payer: Medicare Other | Attending: Urology | Admitting: Urology

## 2022-06-25 ENCOUNTER — Other Ambulatory Visit: Payer: Self-pay | Admitting: Urology

## 2022-06-25 ENCOUNTER — Encounter: Payer: Self-pay | Admitting: Urology

## 2022-06-25 VITALS — BP 115/70 | HR 97 | Ht 63.0 in | Wt 141.0 lb

## 2022-06-25 DIAGNOSIS — R410 Disorientation, unspecified: Secondary | ICD-10-CM | POA: Diagnosis not present

## 2022-06-25 DIAGNOSIS — Z87440 Personal history of urinary (tract) infections: Secondary | ICD-10-CM | POA: Diagnosis not present

## 2022-06-25 DIAGNOSIS — N390 Urinary tract infection, site not specified: Secondary | ICD-10-CM | POA: Insufficient documentation

## 2022-06-25 DIAGNOSIS — Z87448 Personal history of other diseases of urinary system: Secondary | ICD-10-CM

## 2022-06-25 DIAGNOSIS — N135 Crossing vessel and stricture of ureter without hydronephrosis: Secondary | ICD-10-CM | POA: Insufficient documentation

## 2022-06-25 DIAGNOSIS — N1330 Unspecified hydronephrosis: Secondary | ICD-10-CM

## 2022-06-25 LAB — URINALYSIS, COMPLETE (UACMP) WITH MICROSCOPIC
Bilirubin Urine: NEGATIVE
Glucose, UA: NEGATIVE mg/dL
Ketones, ur: NEGATIVE mg/dL
Nitrite: POSITIVE — AB
Protein, ur: 100 mg/dL — AB
Specific Gravity, Urine: 1.02 (ref 1.005–1.030)
WBC, UA: >50 WBC/hpf (ref 0–5)
pH: 6 (ref 5.0–8.0)

## 2022-06-25 NOTE — Addendum Note (Signed)
Addended by: Sueanne Margarita on: 06/25/2022 02:13 PM   Modules accepted: Orders

## 2022-06-25 NOTE — Progress Notes (Addendum)
   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  Diagnostic ureteroscopy in January 2020 did not show any concerning lesions and cytology was negative.  She has been undergoing yearly stent changes since that time with resolution of her left-sided flank pain.  She is here with her daughter who again provides most of the history.  She had originally been doing well with stent changes, but reports she has had about 10 UTIs over the last year since stent was most recently changed in May 2023.  It is very difficult to tell if these are colonization versus true UTIs, as her only symptom is reported confusion per her daughter.  Her daughter does think the confusion improves after giving antibiotics.    Her daughter feels she could have a UTI today as she has been slightly more confused, denies any dysuria, fevers, flank pain, pelvic pain.  She was previously on topical estrogen cream but had trouble remembering to apply that and it was ultimately discontinued, was recently started on Hiprex twice daily which has not seemed to decrease her infections either.  I recommended adding cranberry tablets for maximal UTI prevention.  We also discussed other options like removing the stent or converting to a nephrostomy tube, but they would like to continue with yearly stent changes which I think is very reasonable.  Urine culture today, call with results Schedule cystoscopy and left ureteral stent change If persistent infections after stent change, would consider daily prophylaxis with Keflex 250 mg   Sondra Come, MD  [facility] [address], [address] [place], [place] [address] [phone]

## 2022-06-25 NOTE — Progress Notes (Unsigned)
Surgical Physician Order Form [facility]  * Scheduling expectation : Next Available  *Length of Case: 30 minutes  *Clearance needed: no  *Anticoagulation Instructions: May continue all anticoagulants  *Aspirin Instructions: Ok to continue all  *Post-op visit Date/Instructions:   tbd  *Diagnosis: Left Hydronephrosis  *Procedure: left  Cysto w/stent exchange (16109)   Additional orders: N/A  -Admit type: OUTpatient  -Anesthesia: MAC  -VTE Prophylaxis Standing Order SCD's       Other:   -Standing Lab Orders Per Anesthesia    Lab other: UA&Urine Culture sent 5/28  -Standing Test orders EKG/Chest x-ray per Anesthesia       Test other:   - Medications:  Ancef 2gm IV  -Other orders:  N/A

## 2022-06-26 ENCOUNTER — Telehealth: Payer: Self-pay

## 2022-06-26 DIAGNOSIS — R3989 Other symptoms and signs involving the genitourinary system: Secondary | ICD-10-CM

## 2022-06-26 MED ORDER — CEPHALEXIN 500 MG PO CAPS
500.0000 mg | ORAL_CAPSULE | Freq: Two times a day (BID) | ORAL | 0 refills | Status: DC
Start: 2022-06-26 — End: 2022-07-01

## 2022-06-26 NOTE — Telephone Encounter (Signed)
-----   Message from Sondra Come, MD sent at 06/25/2022  4:13 PM EDT ----- UA suspicious for UTI, please start Keflex 500 mg twice daily x 5 days  Legrand Rams, MD 06/25/2022

## 2022-06-26 NOTE — Telephone Encounter (Signed)
Tried calling patient daughter to set up surgery, no answer. Did leave detailed voicemail to return my call. Will try again.

## 2022-06-26 NOTE — Telephone Encounter (Signed)
Called pt no answer. LM for pt and her daughter per DPR. RX sent in. Advised to call back for questions or concerns.

## 2022-06-27 LAB — URINE CULTURE: Culture: >=100000 — AB

## 2022-06-27 NOTE — Progress Notes (Signed)
   [facility] Posting Form  Surgery Date: Date: 07/12/2022  Surgeon: Dr. Legrand Rams, MD  Inpt ( No  )   Outpt (Yes)   Obs ( No  )   Diagnosis: N13.30 Left Hydronephrosis  -CPT: 16109  Surgery: Left Cystoscopy with Stent Exchange  Stop Anticoagulations: No may continue ASA  Cardiac/Medical/Pulmonary Clearance needed: no  *Orders entered into EPIC  Date: 06/27/22   *Case booked in EPIC  Date: 06/27/22  *Notified pt of Surgery: Date: 06/27/22  PRE-OP UA & CX: yes, obtained while in clinic on 06/25/2022  *Placed into Prior Authorization Work Angela Nevin Date: 06/27/22  Assistant/laser/rep:No

## 2022-06-27 NOTE — Telephone Encounter (Signed)
I spoke with Monica Singh and her daughter Monica Singh. We have discussed possible surgery dates and June 14th, 2024 was agreed upon by all parties. Patient given information about surgery date, what to expect pre-operatively and post operatively.  We discussed that a Pre-Admission Testing office will be calling to set up the pre-op visit that will take place prior to surgery, and that these appointments are typically done over the phone with a Pre-Admissions RN. Informed patient that our office will communicate any additional care to be provided after surgery. Patients questions or concerns were discussed during our call. Advised to call our office should there be any additional information, questions or concerns that arise. Patient verbalized understanding.

## 2022-06-28 ENCOUNTER — Other Ambulatory Visit: Payer: Self-pay | Admitting: Family Medicine

## 2022-06-28 DIAGNOSIS — E538 Deficiency of other specified B group vitamins: Secondary | ICD-10-CM

## 2022-06-28 MED ORDER — VITAMIN B-12 ER 1000 MCG PO TBCR
1000.0000 ug | EXTENDED_RELEASE_TABLET | Freq: Every day | ORAL | 12 refills | Status: DC
Start: 2022-06-28 — End: 2023-05-23

## 2022-07-01 ENCOUNTER — Ambulatory Visit (INDEPENDENT_AMBULATORY_CARE_PROVIDER_SITE_OTHER): Payer: Medicare Other | Admitting: Student in an Organized Health Care Education/Training Program

## 2022-07-01 ENCOUNTER — Ambulatory Visit
Admission: RE | Admit: 2022-07-01 | Discharge: 2022-07-01 | Disposition: A | Discharge status: Home / Self Care | Payer: Medicare Other | Source: Ambulatory Visit | Attending: Student in an Organized Health Care Education/Training Program | Admitting: Student in an Organized Health Care Education/Training Program

## 2022-07-01 ENCOUNTER — Encounter: Payer: Self-pay | Admitting: Student in an Organized Health Care Education/Training Program

## 2022-07-01 ENCOUNTER — Telehealth: Payer: Self-pay | Admitting: Student in an Organized Health Care Education/Training Program

## 2022-07-01 VITALS — BP 110/60 | HR 81 | Temp 97.9°F | Ht 63.0 in | Wt 137.0 lb

## 2022-07-01 DIAGNOSIS — J411 Mucopurulent chronic bronchitis: Secondary | ICD-10-CM | POA: Insufficient documentation

## 2022-07-01 DIAGNOSIS — R0602 Shortness of breath: Secondary | ICD-10-CM

## 2022-07-01 MED ORDER — BREZTRI AEROSPHERE 160-9-4.8 MCG/ACT IN AERO: 2.0000 | INHALATION_SPRAY | Freq: Two times a day (BID) | RESPIRATORY_TRACT | 0 refills | Status: AC

## 2022-07-01 MED ORDER — SODIUM CHLORIDE 3 % IN NEBU
INHALATION_SOLUTION | Freq: Two times a day (BID) | RESPIRATORY_TRACT | 12 refills | Status: DC
Start: 2022-07-01 — End: 2022-11-27

## 2022-07-01 MED ORDER — BREZTRI AEROSPHERE 160-9-4.8 MCG/ACT IN AERO
2.0000 | INHALATION_SPRAY | Freq: Two times a day (BID) | RESPIRATORY_TRACT | 12 refills | Status: DC
Start: 2022-07-01 — End: 2022-11-16

## 2022-07-01 NOTE — Patient Instructions (Addendum)
We will be starting a new inhaler, called breztri, that you should use two puffs twice a day (wash your mouth after every use).  We will start a pulmonary clearance regimen to be done twice daily and as follows:  -use the hypertonic saline via nebulizer -followed by using the incentive spirometer -followed by using the flutter device (also known as acapella device) -followed by using the breztri inhaler  Do this twice a day. If you feel that you are more short of breath with the hypertonic saline, you can discontinue that.

## 2022-07-01 NOTE — Telephone Encounter (Signed)
Verified with Dr. Aundria Rud verbally- Hypertonic solution should be 4ml vials.  Sherry with [facility] is aware and voiced her understanding.  Nothing further needed.

## 2022-07-01 NOTE — Telephone Encounter (Signed)
Sherry checking on the quantity for Sodium Chloride 3 %. Sherry phone number is [phone].

## 2022-07-01 NOTE — Progress Notes (Signed)
Synopsis: Referred in for cough by Malva Limes, MD  Assessment & Plan:   1. Shortness of breath 2. Mucopurulent chronic bronchitis (HCC)  Presents for the evaluation of cough productive of sputum and shortness of breath since being diagnosed with influenza in March of 2024. She has a history of COPD, and is currently using PRN combivent and albuterol. Given chronic symptoms of chough, my differential includes chronic bronchitis as well as bronchiectasis. Will treat COPD with triple therapy, obtain a CXR to rule out pneumonia, and add a pulmonary clearance regimen to her treatment plan (hypertonic saline, albuterol nebs, flutter device).  - DG Chest 2 View; Future - Budeson-Glycopyrrol-Formoterol (BREZTRI AEROSPHERE) 160-9-4.8 MCG/ACT AERO; Inhale 2 puffs into the lungs in the morning and at bedtime.  Dispense: 1 each; Refill: 12 - AMB REFERRAL FOR DME - sodium chloride HYPERTONIC 3 % nebulizer solution; Take by nebulization in the morning and at bedtime.  Dispense: 750 mL; Refill: 12   Return in about 4 weeks (around 07/29/2022).  I spent 60 minutes caring for this patient today, including preparing to see the patient, obtaining a medical history , reviewing a separately obtained history, performing a medically appropriate examination and/or evaluation, counseling and educating the patient/family/caregiver, ordering medications, tests, or procedures, documenting clinical information in the electronic health record, and independently interpreting results (not separately reported/billed) and communicating results to the patient/family/caregiver  Raechel Chute, MD [facility] 07/01/2022 10:44 AM    End of visit medications:  Meds ordered this encounter  Medications   Budeson-Glycopyrrol-Formoterol (BREZTRI AEROSPHERE) 160-9-4.8 MCG/ACT AERO    Sig: Inhale 2 puffs into the lungs in the morning and at bedtime.    Dispense:  1 each    Refill:  12   sodium chloride  HYPERTONIC 3 % nebulizer solution    Sig: Take by nebulization in the morning and at bedtime.    Dispense:  750 mL    Refill:  12   Budeson-Glycopyrrol-Formoterol (BREZTRI AEROSPHERE) 160-9-4.8 MCG/ACT AERO    Sig: Inhale 2 puffs into the lungs in the morning and at bedtime.    Dispense:  10.7 g    Refill:  0     Current Outpatient Medications:    Budeson-Glycopyrrol-Formoterol (BREZTRI AEROSPHERE) 160-9-4.8 MCG/ACT AERO, Inhale 2 puffs into the lungs in the morning and at bedtime., Disp: 1 each, Rfl: 12   Budeson-Glycopyrrol-Formoterol (BREZTRI AEROSPHERE) 160-9-4.8 MCG/ACT AERO, Inhale 2 puffs into the lungs in the morning and at bedtime., Disp: 10.7 g, Rfl: 0   cephALEXin (KEFLEX) 500 MG capsule, Take 500 mg by mouth 4 (four) times daily., Disp: , Rfl:    COMBIVENT RESPIMAT 20-100 MCG/ACT AERS respimat, INHALE 2  PUFFS INTO THE LUNGS 3 TIMES DAILY, Disp: 4 g, Rfl: 4   Cyanocobalamin (VITAMIN B-12 CR) 1000 MCG TBCR, Take 1 tablet (1,000 mcg total) by mouth daily., Disp: 30 tablet, Rfl: 12   Dextromethorphan-Menthol (DELSYM COUGH RELIEF MT), Use as directed in the mouth or throat., Disp: , Rfl:    FOLIC ACID PO, Take by mouth., Disp: , Rfl:    hydrocortisone 2.5 % cream, Apply to affected areas face once to twice daily as directed., Disp: 30 g, Rfl: 3   ibuprofen (ADVIL) 200 MG tablet, Take 400 mg by mouth every 6 (six) hours as needed., Disp: , Rfl:    ketoconazole (NIZORAL) 2 % cream, Apply to affected areas face once to twice daily as directed., Disp: 30 g, Rfl: 3   methenamine (HIPREX)  1 g tablet, Take 1 tablet (1 g total) by mouth 2 (two) times daily with a meal., Disp: 60 tablet, Rfl: 3   Probiotic Product (ALIGN) 4 MG CAPS, Take 1 capsule (4 mg total) by mouth daily., Disp: , Rfl:    psyllium (HYDROCIL/METAMUCIL) 95 % PACK, Take 1 packet by mouth daily., Disp: , Rfl:    sodium chloride HYPERTONIC 3 % nebulizer solution, Take by nebulization in the morning and at bedtime., Disp: 750  mL, Rfl: 12   Subjective:   [name] GENDER: female DOB: 1928-05-27, MRN: [id number]  Chief Complaint  Patient presents with   pulmonary consult    Hx of PNA. C/o prod cough with white to yellow sputum and SOB with exertion.     HPI  Monica Singh is a pleasant 89 year old female presenting to clinic for the evaluation of cough.  Cough started after an URTI, and has persisted since. It's been ongoing for a few months, with no sign of relief. She reports that it is productive of whitish sputum, with no report of hemoptysis. There are no fevers, chills, night sweats, or weight loss reported. She has no chest pain or chest tightness, but does report an occasional wheeze. She was given courses of prednisone as well as a rescue inhaler without much improvement in symptoms. Patient does carry a diagnosis of COPD, and reports using an as needed inhaler for it (combivent and albuterol). She was admitted to the hospital in March where she was found to be influenza B positive, and that's when her symptoms started. She's not been able to get over this. She denies aspiration of food content.  She does not have any personal history of asthma, bronchiectasis, or other lung disease. She was told she had COPD in the past. She used to smoke cigarettes, and quit in 1991.   Ancillary information including prior medications, full medical/surgical/family/social histories, and PFTs (when available) are listed below and have been reviewed.   Review of Systems  Constitutional:  Positive for malaise/fatigue. Negative for chills, fever and weight loss.  Respiratory:  Positive for cough, sputum production, shortness of breath and wheezing. Negative for hemoptysis.   Cardiovascular:  Negative for chest pain.     Objective:   Vitals:   07/01/22 1022  BP: 110/60  Pulse: 81  Temp: 97.9 F (36.6 C)  TempSrc: Temporal  SpO2: 95%  Weight: 137 lb (62.1 kg)  Height: 5\' 3"  (1.6 m)   95% on  RA BMI Readings from Last 3 Encounters:  07/01/22 24.27 kg/m  06/25/22 24.98 kg/m  06/05/22 24.98 kg/m   Wt Readings from Last 3 Encounters:  07/01/22 137 lb (62.1 kg)  06/25/22 141 lb (64 kg)  06/05/22 141 lb (64 kg)    Physical Exam Constitutional:      General: She is not in acute distress.    Appearance: Normal appearance. She is not ill-appearing.  Cardiovascular:     Rate and Rhythm: Normal rate and regular rhythm.     Pulses: Normal pulses.     Heart sounds: Normal heart sounds.  Pulmonary:     Effort: Pulmonary effort is normal.     Breath sounds: Wheezing (faint end expiratory wheezes at the basese) and rales present. No rhonchi.  Musculoskeletal:     Right lower leg: No edema.     Left lower leg: No edema.  Neurological:     General: No focal deficit present.     Mental Status:  She is alert.     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 lat. elbow. SCCis arising in [place]. Excised 01/04/2009, margins free.    Squamous cell carcinoma of skin 07/26/2020   right hand   Syncope      Family History  Problem Relation Age of Onset   Breast cancer Sister     Dementia Sister    Cataracts Sister    Heart attack Brother    CAD Brother    Heart attack Brother    Leukemia Grandchild        granddaughter   Kidney disease Neg Hx    Bladder Cancer Neg Hx      Past Surgical History:  Procedure Laterality Date   ABDOMINAL SURGERY  2009   abdominal mass+ NH lymphoma,   BACK SURGERY  01/2017   fusion. metal plate in neck at back   CATARACT EXTRACTION  1997   right eye and left ey   cervical neck fusion  1995   CYSTOSCOPY W/ RETROGRADES Left 01/30/2018   Procedure: CYSTOSCOPY WITH RETROGRADE PYELOGRAM;  Surgeon: Sondra Come, MD;  Location: [facility];  Service: Urology;  Laterality: Left;   CYSTOSCOPY W/ RETROGRADES Left 08/05/2018   Procedure: CYSTOSCOPY WITH RETROGRADE PYELOGRAM;  Surgeon: Sondra Come, MD;  Location: [facility];  Service: Urology;  Laterality: Left;   CYSTOSCOPY W/ RETROGRADES Left 06/01/2021   Procedure: CYSTOSCOPY WITH RETROGRADE PYELOGRAM;  Surgeon: Sondra Come, MD;  Location: [facility];  Service: Urology;  Laterality: Left;   CYSTOSCOPY W/ URETERAL STENT PLACEMENT Left 08/05/2018   Procedure: CYSTOSCOPY WITH STENT Exchange;  Surgeon: Sondra Come, MD;  Location: [facility];  Service: Urology;  Laterality: Left;   CYSTOSCOPY W/ URETERAL STENT PLACEMENT Left 05/21/2019   Procedure: CYSTOSCOPY WITH STENT REPLACEMENT;  Surgeon: Sondra Come, MD;  Location: [facility];  Service: Urology;  Laterality: Left;   CYSTOSCOPY W/ URETERAL STENT PLACEMENT Left 05/05/2020   Procedure: CYSTOSCOPY WITH RETROGRADE PYELOGRAM/URETERAL STENT EXCHANGE;  Surgeon: Sondra Come, MD;  Location: [facility];  Service: Urology;  Laterality: Left;   CYSTOSCOPY W/ URETERAL STENT PLACEMENT Left 06/01/2021   Procedure: CYSTOSCOPY WITH STENT EXCHANGE;  Surgeon: Sondra Come, MD;  Location: [facility];  Service: Urology;  Laterality: Left;   CYSTOSCOPY WITH BIOPSY Left 02/27/2018   Procedure: CYSTOSCOPY WITH BIOPSY;  Surgeon: Sondra Come, MD;   Location: [facility];  Service: Urology;  Laterality: Left;   CYSTOSCOPY WITH STENT PLACEMENT Left 01/30/2018   Procedure:  CYSTOSCOPY WITH STENT PLACEMENT;  Surgeon: Sondra Come, MD;  Location: [facility];  Service: Urology;  Laterality: Left;   CYSTOSCOPY WITH URETEROSCOPY AND STENT PLACEMENT Left 02/27/2018   Procedure: CYSTOSCOPY WITH URETEROSCOPY AND STENT Exchange;  Surgeon: Sondra Come, MD;  Location: [facility];  Service: Urology;  Laterality: Left;   DENTAL SURGERY     screws   JOINT REPLACEMENT Left    knee   KNEE ARTHROPLASTY Right 12/23/2018   Procedure: RIGHT COMPUTER ASSISTED TOTAL KNEE ARTHROPLASTY;  Surgeon: Donato Heinz, MD;  Location: [facility];  Service: Orthopedics;  Laterality: Right;   KYPHOPLASTY N/A 02/21/2017   Procedure: [id number];  Surgeon: Kennedy Bucker, MD;  Location: [facility];  Service: Orthopedics;  Laterality: N/A;   laparotomy with biopsy  03/02/2007   PORTACATH PLACEMENT  2009   SPINE SURGERY  1997   SQUAMOUS CELL CARCINOMA EXCISION     right arm   TOTAL KNEE ARTHROPLASTY Left    2009   URETEROSCOPY Left 01/30/2018   Procedure: URETEROSCOPY;  Surgeon: Sondra Come, MD;  Location: [facility];  Service: Urology;  Laterality: Left;   VAGINAL HYSTERECTOMY  1971    Social History   Socioeconomic History   Marital status: Widowed    Spouse name: Not on file   Number of children: 5   Years of education: Not on file   Highest education level: GED or equivalent  Occupational History   Occupation: retired    Comment: homemaker  Tobacco Use   Smoking status: Former    Packs/day: .25    Types: Cigarettes    Quit date: 06/21/1989    Years since quitting: 33.0    Passive exposure: Past   Smokeless tobacco: Never  Vaping Use   Vaping Use: Never used  Substance and Sexual Activity   Alcohol use: Not Currently    Comment: not anymore   Drug use: No   Sexual activity: Not Currently    Birth control/protection: Post-menopausal  Other Topics  Concern   Not on file  Social History Narrative   Ambulates independently, lives at home by herself   Social Determinants of Health   Financial Resource Strain: Low Risk  (08/15/2021)   Overall Financial Resource Strain (CARDIA)    Difficulty of Paying Living Expenses: Not hard at all  Food Insecurity: No Food Insecurity (04/10/2022)   Hunger Vital Sign    Worried About Running Out of Food in the Last Year: Never true    Ran Out of Food in the Last Year: Never true  Transportation Needs: No Transportation Needs (04/10/2022)   PRAPARE - Administrator, Civil Service (Medical): No    Lack of Transportation (Non-Medical): No  Physical Activity: Insufficiently Active (08/15/2021)   Exercise Vital Sign    Days of Exercise per Week: 7 days    Minutes of Exercise per Session: 20 min  Stress: No Stress Concern Present (08/15/2021)   [employer] of Occupational Health - Occupational Stress Questionnaire    Feeling of Stress : Not at all  Social Connections: Socially Isolated (08/15/2021)   Social Connection and Isolation Panel [NHANES]    Frequency of Communication with Friends and Family: More than three times a week    Frequency of Social Gatherings with Friends and Family: More than three times a week    Attends Religious Services: Never    Database administrator or Organizations: No    Attends Banker Meetings: Never  Marital Status: Widowed  Intimate Partner Violence: Not At Risk (04/10/2022)   Humiliation, Afraid, Rape, and Kick questionnaire    Fear of Current or Ex-Partner: No    Emotionally Abused: No    Physically Abused: No    Sexually Abused: No     Allergies  Allergen Reactions   Ciprofloxacin Itching    Whelps on face reported 03/16/2021   Milk-Related Compounds Diarrhea and Nausea And Vomiting    Gas too   Latex Itching   Misc. Sulfonamide Containing Compounds    Milk (Cow) Diarrhea and Nausea And Vomiting    Milk allergy    Sulfa  Antibiotics Hives and Itching     CBC    Component Value Date/Time   WBC 6.0 04/14/2022 0320   RBC 3.75 (L) 04/14/2022 0320   HGB 10.3 (L) 04/14/2022 0320   HGB 13.7 12/22/2019 1050   HCT 31.5 (L) 04/14/2022 0320   HCT 40.6 12/22/2019 1050   PLT 286 04/14/2022 0320   PLT 353 12/22/2019 1050   MCV 84.0 04/14/2022 0320   MCV 92 12/22/2019 1050   MCV 93 05/10/2014 1421   MCH 27.5 04/14/2022 0320   MCHC 32.7 04/14/2022 0320   RDW 14.4 04/14/2022 0320   RDW 12.0 12/22/2019 1050   RDW 12.6 05/10/2014 1421   LYMPHSABS 1.5 04/10/2022 0837   LYMPHSABS 1.2 05/10/2014 1421   MONOABS 0.6 04/10/2022 0837   MONOABS 0.5 05/10/2014 1421   EOSABS 0.1 04/10/2022 0837   EOSABS 0.4 05/10/2014 1421   BASOSABS 0.1 04/10/2022 0837   BASOSABS 0.1 05/10/2014 1421    Pulmonary Functions Testing Results:     No data to display          Outpatient Medications Prior to Visit  Medication Sig Dispense Refill   cephALEXin (KEFLEX) 500 MG capsule Take 500 mg by mouth 4 (four) times daily.     COMBIVENT RESPIMAT 20-100 MCG/ACT AERS respimat INHALE 2  PUFFS INTO THE LUNGS 3 TIMES DAILY 4 g 4   Cyanocobalamin (VITAMIN B-12 CR) 1000 MCG TBCR Take 1 tablet (1,000 mcg total) by mouth daily. 30 tablet 12   Dextromethorphan-Menthol (DELSYM COUGH RELIEF MT) Use as directed in the mouth or throat.     FOLIC ACID PO Take by mouth.     hydrocortisone 2.5 % cream Apply to affected areas face once to twice daily as directed. 30 g 3   ibuprofen (ADVIL) 200 MG tablet Take 400 mg by mouth every 6 (six) hours as needed.     ketoconazole (NIZORAL) 2 % cream Apply to affected areas face once to twice daily as directed. 30 g 3   methenamine (HIPREX) 1 g tablet Take 1 tablet (1 g total) by mouth 2 (two) times daily with a meal. 60 tablet 3   Probiotic Product (ALIGN) 4 MG CAPS Take 1 capsule (4 mg total) by mouth daily.     psyllium (HYDROCIL/METAMUCIL) 95 % PACK Take 1 packet by mouth daily.     cephALEXin (KEFLEX)  500 MG capsule Take 1 capsule (500 mg total) by mouth 2 (two) times daily for 5 days. 10 capsule 0   No facility-administered medications prior to visit.

## 2022-07-03 ENCOUNTER — Encounter
Admission: RE | Admit: 2022-07-03 | Discharge: 2022-07-03 | Disposition: A | Discharge status: Home / Self Care | Payer: Medicare Other | Source: Ambulatory Visit | Attending: Urology | Admitting: Urology

## 2022-07-03 ENCOUNTER — Other Ambulatory Visit: Payer: Self-pay

## 2022-07-03 HISTORY — DX: Unspecified dementia, unspecified severity, without behavioral disturbance, psychotic disturbance, mood disturbance, and anxiety: F03.90

## 2022-07-03 NOTE — Patient Instructions (Addendum)
Your procedure is scheduled on: Friday July 12, 2022. Report to the Registration Desk on the 1st floor of the [facility]. To find out your arrival time, please call [phone] between 1PM - 3PM on: Thursday July 11, 2022. If your arrival time is 6:00 am, do not arrive before that time as the [facility] entrance doors do not open until 6:00 am.  REMEMBER: Instructions that are not followed completely may result in serious medical risk, up to and including death; or upon the discretion of your surgeon and anesthesiologist your surgery may need to be rescheduled.  Do not eat food after midnight the night before surgery.  No gum chewing or hard candies.   One week prior to surgery: Stop Anti-inflammatories (NSAIDS) such as Advil, Aleve, Ibuprofen, Motrin, Naproxen, Naprosyn and Aspirin based products such as Excedrin, Goody's Powder, BC Powder. Stop ANY OVER THE COUNTER supplements until after surgery. You may however, continue to take Tylenol if needed for pain up until the day of surgery.  Continue taking all prescribed medications with the exception of the following:  Follow recommendations from Cardiologist or PCP regarding stopping blood thinners.  TAKE ONLY THESE MEDICATIONS THE MORNING OF SURGERY WITH A SIP OF WATER:  cephALEXin (KEFLEX) 500 MG    Use inhalers on the day of surgery and bring to the hospital. Budeson-Glycopyrrol-Formoterol (BREZTRI AEROSPHERE) 160-9-4.8 MCG/ACT AERO  COMBIVENT RESPIMAT 20-100 MCG/ACT AERS respimat   No Alcohol for 24 hours before or after surgery.  No Smoking including e-cigarettes for 24 hours before surgery.  No chewable tobacco products for at least 6 hours before surgery.  No nicotine patches on the day of surgery.  Do not use any "recreational" drugs for at least a week (preferably 2 weeks) before your surgery.  Please be advised that the combination of cocaine and anesthesia may have negative outcomes, up to and including  death. If you test positive for cocaine, your surgery will be cancelled.  On the morning of surgery brush your teeth with toothpaste and water, you may rinse your mouth with mouthwash if you wish. Do not swallow any toothpaste or mouthwash.  Use CHG Soap or wipes as directed on instruction sheet.  Do not wear jewelry, make-up, hairpins, clips or nail polish.  Do not wear lotions, powders, or perfumes.   Do not shave body hair from the neck down 48 hours before surgery.  Contact lenses, hearing aids and dentures may not be worn into surgery.  Do not bring valuables to the hospital. [facility] is not responsible for any missing/lost belongings or valuables.   Notify your doctor if there is any change in your medical condition (cold, fever, infection).  Wear comfortable clothing (specific to your surgery type) to the hospital.  After surgery, you can help prevent lung complications by doing breathing exercises.  Take deep breaths and cough every 1-2 hours. Your doctor may order a device called an Incentive Spirometer to help you take deep breaths. When coughing or sneezing, hold a pillow firmly against your incision with both hands. This is called "splinting." Doing this helps protect your incision. It also decreases belly discomfort.  If you are being admitted to the hospital overnight, leave your suitcase in the car. After surgery it may be brought to your room.  In case of increased patient census, it may be necessary for you, the patient, to continue your postoperative care in the Same Day Surgery department.  If you are being discharged the day of surgery,  you will not be allowed to drive home. You will need a responsible individual to drive you home and stay with you for 24 hours after surgery.   If you are taking public transportation, you will need to have a responsible individual with you.  Please call the Pre-admissions Testing Dept. at [phone] if you have any  questions about these instructions.  Surgery Visitation Policy:  Patients having surgery or a procedure may have two visitors.  Children under the age of 73 must have an adult with them who is not the patient.  Inpatient Visitation:    Visiting hours are 7 a.m. to 8 p.m. Up to four visitors are allowed at one time in a patient room. The visitors may rotate out with other people during the day.  One visitor age 16 or older may stay with the patient overnight and must be in the room by 8 p.m.

## 2022-07-09 ENCOUNTER — Telehealth: Payer: Self-pay

## 2022-07-09 MED ORDER — NITROFURANTOIN MONOHYD MACRO 100 MG PO CAPS: 100.0000 mg | ORAL_CAPSULE | Freq: Two times a day (BID) | ORAL | 0 refills | Status: DC

## 2022-07-09 NOTE — Telephone Encounter (Signed)
Okay to keep stent change as scheduled.  I think she was on Keflex previously, can stop that and try nitrofurantoin 100 mg twice daily x 7 days  Legrand Rams, MD 07/09/2022   Spoke with daughter [name]. Advised that we will send in new Abx to [facility]. Patient daughter very grateful and verbalized understanding.

## 2022-07-12 ENCOUNTER — Ambulatory Visit: Payer: Medicare Other | Admitting: Anesthesiology

## 2022-07-12 ENCOUNTER — Other Ambulatory Visit: Payer: Self-pay

## 2022-07-12 ENCOUNTER — Encounter: Payer: Self-pay | Admitting: Urology

## 2022-07-12 ENCOUNTER — Ambulatory Visit: Payer: Medicare Other

## 2022-07-12 ENCOUNTER — Ambulatory Visit
Admission: RE | Admit: 2022-07-12 | Discharge: 2022-07-12 | Disposition: A | Discharge status: Home / Self Care | Payer: Medicare Other | Source: Ambulatory Visit | Attending: Urology | Admitting: Urology

## 2022-07-12 ENCOUNTER — Encounter
Admission: RE | Disposition: A | Discharge status: Home / Self Care | Payer: Self-pay | Source: Ambulatory Visit | Attending: Urology

## 2022-07-12 DIAGNOSIS — Z87891 Personal history of nicotine dependence: Secondary | ICD-10-CM | POA: Insufficient documentation

## 2022-07-12 DIAGNOSIS — Z87440 Personal history of urinary (tract) infections: Secondary | ICD-10-CM | POA: Insufficient documentation

## 2022-07-12 DIAGNOSIS — N135 Crossing vessel and stricture of ureter without hydronephrosis: Secondary | ICD-10-CM | POA: Diagnosis present

## 2022-07-12 DIAGNOSIS — N1330 Unspecified hydronephrosis: Secondary | ICD-10-CM

## 2022-07-12 HISTORY — PX: CYSTOSCOPY WITH STENT PLACEMENT: SHX5790

## 2022-07-12 SURGERY — CYSTOSCOPY, WITH STENT INSERTION
Anesthesia: General | Laterality: Left

## 2022-07-12 MED ORDER — CEPHALEXIN 250 MG PO CAPS: 250.0000 mg | ORAL_CAPSULE | Freq: Every day | ORAL | 0 refills | Status: DC

## 2022-07-12 MED ORDER — IOHEXOL 180 MG/ML  SOLN
INTRAMUSCULAR | Status: DC | PRN
  Administered 2022-07-12: 10 mL

## 2022-07-12 MED ORDER — SODIUM CHLORIDE 0.9 % IR SOLN
Status: DC | PRN
  Administered 2022-07-12: 3000 mL via INTRAVESICAL

## 2022-07-12 MED ORDER — ONDANSETRON HCL 4 MG/2ML IJ SOLN: 4.0000 mg | Freq: Once | INTRAMUSCULAR | Status: DC | PRN

## 2022-07-12 MED ORDER — CEFAZOLIN SODIUM-DEXTROSE 2-4 GM/100ML-% IV SOLN
2.0000 g | INTRAVENOUS | Status: AC
  Administered 2022-07-12: 2 g via INTRAVENOUS

## 2022-07-12 MED ORDER — CHLORHEXIDINE GLUCONATE 0.12 % MT SOLN
OROMUCOSAL | Status: AC
  Filled 2022-07-12: qty 15

## 2022-07-12 MED ORDER — PROPOFOL 500 MG/50ML IV EMUL
INTRAVENOUS | Status: DC | PRN
  Administered 2022-07-12: 20 mg via INTRAVENOUS
  Administered 2022-07-12: 70 ug/kg/min via INTRAVENOUS

## 2022-07-12 MED ORDER — FAMOTIDINE 20 MG PO TABS
20.0000 mg | ORAL_TABLET | Freq: Once | ORAL | Status: AC
  Administered 2022-07-12: 20 mg via ORAL

## 2022-07-12 MED ORDER — CHLORHEXIDINE GLUCONATE 0.12 % MT SOLN
15.0000 mL | Freq: Once | OROMUCOSAL | Status: AC
  Administered 2022-07-12: 15 mL via OROMUCOSAL

## 2022-07-12 MED ORDER — ONDANSETRON HCL 4 MG/2ML IJ SOLN
INTRAMUSCULAR | Status: DC | PRN
  Administered 2022-07-12: 4 mg via INTRAVENOUS

## 2022-07-12 MED ORDER — FAMOTIDINE 20 MG PO TABS
ORAL_TABLET | ORAL | Status: AC
  Filled 2022-07-12: qty 1

## 2022-07-12 MED ORDER — LACTATED RINGERS IV SOLN: INTRAVENOUS | Status: DC

## 2022-07-12 MED ORDER — CEFAZOLIN SODIUM-DEXTROSE 2-4 GM/100ML-% IV SOLN
INTRAVENOUS | Status: AC
  Filled 2022-07-12: qty 100

## 2022-07-12 MED ORDER — LIDOCAINE HCL (CARDIAC) PF 100 MG/5ML IV SOSY
PREFILLED_SYRINGE | INTRAVENOUS | Status: DC | PRN
  Administered 2022-07-12: 60 mg via INTRAVENOUS

## 2022-07-12 MED ORDER — FENTANYL CITRATE (PF) 100 MCG/2ML IJ SOLN: 25.0000 ug | INTRAMUSCULAR | Status: DC | PRN

## 2022-07-12 MED ORDER — ORAL CARE MOUTH RINSE: 15.0000 mL | Freq: Once | OROMUCOSAL | Status: AC

## 2022-07-12 SURGICAL SUPPLY — 17 items
BAG DRAIN SIEMENS DORNER NS (MISCELLANEOUS) ×1 IMPLANT
BAG DRN NS LF (MISCELLANEOUS) ×1
BRUSH SCRUB EZ 1% IODOPHOR (MISCELLANEOUS) IMPLANT
CATH URETL OPEN 5X70 (CATHETERS) ×1 IMPLANT
GLOVE BIOGEL PI IND STRL 7.5 (GLOVE) ×1 IMPLANT
GOWN STRL REUS W/ TWL LRG LVL3 (GOWN DISPOSABLE) ×1 IMPLANT
GOWN STRL REUS W/ TWL XL LVL3 (GOWN DISPOSABLE) ×1 IMPLANT
GOWN STRL REUS W/TWL LRG LVL3 (GOWN DISPOSABLE) ×1
GOWN STRL REUS W/TWL XL LVL3 (GOWN DISPOSABLE) ×1
GUIDEWIRE STR DUAL SENSOR (WIRE) ×1 IMPLANT
IV NS IRRIG 3000ML ARTHROMATIC (IV SOLUTION) ×1 IMPLANT
KIT TURNOVER CYSTO (KITS) ×1 IMPLANT
PACK CYSTO AR (MISCELLANEOUS) ×1 IMPLANT
SET CYSTO W/LG BORE CLAMP LF (SET/KITS/TRAYS/PACK) ×1 IMPLANT
STENT URO INLAY 6FRX24CM (STENTS) IMPLANT
SURGILUBE 2OZ TUBE FLIPTOP (MISCELLANEOUS) ×1 IMPLANT
WATER STERILE IRR 500ML POUR (IV SOLUTION) ×1 IMPLANT

## 2022-07-12 NOTE — Transfer of Care (Signed)
Immediate Anesthesia Transfer of Care Note  Patient: Monica Singh  Procedure(s) Performed: CYSTOSCOPY WITH STENT EXCHANGE (Left)  Patient Location: PACU  Anesthesia Type:MAC  Level of Consciousness: awake  Airway & Oxygen Therapy: Patient Spontanous Breathing  Post-op Assessment: Report given to RN and Post -op Vital signs reviewed and stable  Post vital signs: Reviewed and stable  Last Vitals:  Vitals Value Taken Time  BP 132/68 07/12/22 0933  Temp    Pulse 81 07/12/22 0938  Resp 20 07/12/22 0938  SpO2 100 % 07/12/22 0938  Vitals shown include unvalidated device data.  Last Pain:  Vitals:   07/12/22 0744  TempSrc: Temporal  PainSc: 0-No pain         Complications: No notable events documented.

## 2022-07-12 NOTE — Op Note (Signed)
Date of procedure: 07/12/22  Preoperative diagnosis:  Left UPJ obstruction  Postoperative diagnosis:  Same  Procedure: Cystoscopy, left retrograde pyelogram with intraoperative interpretation, left ureteral stent change  Surgeon: Legrand Rams, MD  Anesthesia: General  Complications: None  Intraoperative findings:  Distal end of stent did have moderate amount of debris, bladder grossly normal with some cloudy urine but no suspicious bladder lesions Uncomplicated stent change, no evidence of filling defects on retrograde pyelogram  EBL: None  Specimens: None  Drains: Left 6 French by 26 cm Bard Optima stent  Indication: [name] is a 89 y.o. patient with chronic left UPJ obstruction that has been managed with yearly stent changes.  She has had problems with recurrent infections recently.  After reviewing the management options for treatment, they elected to proceed with the above surgical procedure(s). We have discussed the potential benefits and risks of the procedure, side effects of the proposed treatment, the likelihood of the patient achieving the goals of the procedure, and any potential problems that might occur during the procedure or recuperation. Informed consent has been obtained.  Description of procedure:  The patient was taken to the operating room and MAC was induced. SCDs were placed for DVT prophylaxis. The patient was placed in the dorsal lithotomy position, prepped and draped in the usual sterile fashion, and preoperative antibiotics(Ancef) were administered. A preoperative time-out was performed.   A 21 French rigid cystoscope was used to intubate the urethra and thorough cystoscopy was performed.  The urine was cloudy and was irrigated until clear.  There were no suspicious bladder lesions.  There was a mild to moderate amount of debris on the distal end of the left ureteral stent.  A wire was advanced alongside the stent up to the kidney under  fluoroscopic vision, and the old stent removed easily.  A 5 French access catheter advanced over the wire to the proximal ureter and a retrograde pyelogram showed no extravasation or filling defects, no significant hydronephrosis.  The wire was replaced, and a 6 [place] by 24 cm ureteral stent was uneventfully placed using the cystoscope with a curl in the renal pelvis as well as under direct vision in the bladder.  Cloudy urine drained through the side ports of the stent.  The bladder was drained and this concluded the procedure.  Disposition: Stable to PACU  Plan: Trial of Keflex 250 mg daily x 90 days for UTI prevention RTC with me in clinic 3 months symptom check  Legrand Rams, MD

## 2022-07-12 NOTE — Anesthesia Preprocedure Evaluation (Signed)
Anesthesia Evaluation  Patient identified by MRN, date of birth, ID band Patient awake    Reviewed: Allergy & Precautions, NPO status , Patient's Chart, lab work & pertinent test results  History of Anesthesia Complications (+) PONV and history of anesthetic complications  Airway Mallampati: III  TM Distance: <3 FB Neck ROM: full    Dental  (+) Upper Dentures   Pulmonary neg pulmonary ROS, shortness of breath and with exertion, COPD,  COPD inhaler, Patient abstained from smoking., former smoker Recent hx of bronchitus   Pulmonary exam normal breath sounds clear to auscultation       Cardiovascular Exercise Tolerance: Poor hypertension, Pt. on medications negative cardio ROS Normal cardiovascular exam Rhythm:Regular Rate:Normal     Neuro/Psych  PSYCHIATRIC DISORDERS     Dementia  Neuromuscular disease negative neurological ROS  negative psych ROS   GI/Hepatic negative GI ROS, Neg liver ROS, hiatal hernia,,,  Endo/Other  negative endocrine ROS    Renal/GU Renal diseasenegative Renal ROS  negative genitourinary   Musculoskeletal  (+) Arthritis ,    Abdominal Normal abdominal exam  (+)   Peds negative pediatric ROS (+)  Hematology negative hematology ROS (+)   Anesthesia Other Findings Past Medical History: No date: Actinic keratosis 06/03/2014: Adnexal mass     Comment:  since 2009 No date: Arthritis No date: Chronic kidney disease No date: Complication of anesthesia No date: COPD (chronic obstructive pulmonary disease) (HCC) No date: Dementia (HCC) 01/2007: 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)     Comment:  NON-HODGKINS-stage 3, cd 20 positive; status post 6               cycles of R-CHOP No date: Dyspnea     Comment:  with exertion 04/10/2022: Essential hypertension 01/31/2009: Herpes zoster without complication No date: History of hiatal hernia No date: HOH (hard of hearing)     Comment:   wears hearing aides No date: Mitral regurgitation No date: Non Hodgkin's lymphoma (HCC) No date: Personal history of chemotherapy No date: PONV (postoperative nausea and vomiting)     Comment:  in January lasted about 4 hours No date: Recurrent UTI 11/09/2008: Squamous cell carcinoma of skin     Comment:  Right post. lat. elbow. SCCis arising in [place]. Excised               01/04/2009, margins free.  07/26/2020: Squamous cell carcinoma of skin     Comment:  right hand No date: Syncope  Past Surgical History: 2009: ABDOMINAL SURGERY     Comment:  abdominal mass+ NH lymphoma, 01/2017: BACK SURGERY     Comment:  fusion. metal plate in neck at back 1997: CATARACT EXTRACTION     Comment:  right eye and left ey 1995: cervical neck fusion 01/30/2018: CYSTOSCOPY W/ RETROGRADES; Left     Comment:  Procedure: CYSTOSCOPY WITH RETROGRADE PYELOGRAM;                Surgeon: Sondra Come, MD;  Location: [facility];                Service: Urology;  Laterality: Left; 08/05/2018: CYSTOSCOPY W/ RETROGRADES; Left     Comment:  Procedure: CYSTOSCOPY WITH RETROGRADE PYELOGRAM;                Surgeon: Sondra Come, MD;  Location: [facility];                Service: Urology;  Laterality: Left; 06/01/2021: CYSTOSCOPY W/ RETROGRADES;  Left     Comment:  Procedure: CYSTOSCOPY WITH RETROGRADE PYELOGRAM;                Surgeon: Sondra Come, MD;  Location: [facility];                Service: Urology;  Laterality: Left; 08/05/2018: CYSTOSCOPY W/ URETERAL STENT PLACEMENT; Left     Comment:  Procedure: CYSTOSCOPY WITH STENT Exchange;  Surgeon:               Sondra Come, MD;  Location: [facility];  Service:               Urology;  Laterality: Left; 05/21/2019: CYSTOSCOPY W/ URETERAL STENT PLACEMENT; Left     Comment:  Procedure: CYSTOSCOPY WITH STENT REPLACEMENT;  Surgeon:               Sondra Come, MD;  Location: [facility];  Service:               Urology;  Laterality: Left; 05/05/2020: CYSTOSCOPY W/  URETERAL STENT PLACEMENT; Left     Comment:  Procedure: CYSTOSCOPY WITH RETROGRADE PYELOGRAM/URETERAL              STENT EXCHANGE;  Surgeon: Sondra Come, MD;                Location: [facility];  Service: Urology;  Laterality: Left; 06/01/2021: CYSTOSCOPY W/ URETERAL STENT PLACEMENT; Left     Comment:  Procedure: CYSTOSCOPY WITH STENT EXCHANGE;  Surgeon:               Sondra Come, MD;  Location: [facility];  Service:               Urology;  Laterality: Left; 02/27/2018: CYSTOSCOPY WITH BIOPSY; Left     Comment:  Procedure: CYSTOSCOPY WITH BIOPSY;  Surgeon: Sondra Come, MD;  Location: [facility];  Service: Urology;                Laterality: Left; 01/30/2018: CYSTOSCOPY WITH STENT PLACEMENT; Left     Comment:  Procedure: CYSTOSCOPY WITH STENT PLACEMENT;  Surgeon:               Sondra Come, MD;  Location: [facility];  Service:               Urology;  Laterality: Left; 02/27/2018: CYSTOSCOPY WITH URETEROSCOPY AND STENT PLACEMENT; Left     Comment:  Procedure: CYSTOSCOPY WITH URETEROSCOPY AND STENT               Exchange;  Surgeon: Sondra Come, MD;  Location: [facility];  Service: Urology;  Laterality: Left; No date: DENTAL SURGERY     Comment:  screws No date: JOINT REPLACEMENT; Left     Comment:  knee 12/23/2018: KNEE ARTHROPLASTY; Right     Comment:  Procedure: RIGHT COMPUTER ASSISTED TOTAL KNEE               ARTHROPLASTY;  Surgeon: Donato Heinz, MD;  Location:               [facility];  Service: Orthopedics;  Laterality: Right; 02/21/2017: KYPHOPLASTY; N/A     Comment:  Procedure: [id number];  Surgeon: Kennedy Bucker, MD;  Location: [facility];  Service: Orthopedics;  Laterality:               N/A; 03/02/2007: laparotomy with biopsy 2009: PORTACATH PLACEMENT 1997: SPINE SURGERY No date: SQUAMOUS CELL CARCINOMA EXCISION     Comment:  right arm No date: TOTAL KNEE ARTHROPLASTY; Left     Comment:  2009 01/30/2018: URETEROSCOPY; Left      Comment:  Procedure: URETEROSCOPY;  Surgeon: Sondra Come, MD;              Location: [facility];  Service: Urology;  Laterality: Left; 1971: VAGINAL HYSTERECTOMY  BMI    Body Mass Index: 24.98 kg/m      Reproductive/Obstetrics negative OB ROS                             Anesthesia Physical Anesthesia Plan  ASA: 3  Anesthesia Plan: General   Post-op Pain Management:    Induction: Intravenous  PONV Risk Score and Plan: Propofol infusion and TIVA  Airway Management Planned: Simple Face Mask  Additional Equipment:   Intra-op Plan:   Post-operative Plan:   Informed Consent: I have reviewed the patients History and Physical, chart, labs and discussed the procedure including the risks, benefits and alternatives for the proposed anesthesia with the patient or authorized representative who has indicated his/her understanding and acceptance.     Dental Advisory Given  Plan Discussed with: CRNA and Surgeon  Anesthesia Plan Comments:        Anesthesia Quick Evaluation

## 2022-07-12 NOTE — Anesthesia Postprocedure Evaluation (Signed)
Anesthesia Post Note  Patient: Monica Singh  Procedure(s) Performed: CYSTOSCOPY WITH STENT EXCHANGE (Left)  Patient location during evaluation: PACU Anesthesia Type: General Level of consciousness: awake Pain management: satisfactory to patient Vital Signs Assessment: post-procedure vital signs reviewed and stable Respiratory status: spontaneous breathing and nonlabored ventilation Cardiovascular status: stable Anesthetic complications: no   No notable events documented.   Last Vitals:  Vitals:   07/12/22 1013 07/12/22 1019  BP:  (!) 142/83  Pulse: 88   Resp: 16   Temp: (!) 36.2 C   SpO2: 100% 100%    Last Pain:  Vitals:   07/12/22 1013  TempSrc: Temporal  PainSc: 0-No pain                 VAN STAVEREN,Winola Drum

## 2022-07-12 NOTE — H&P (Signed)
07/12/22 8:56 AM   Monica Singh 1928/10/05 [id number]  CC: Left UPJ obstruction  HPI: 89 year old female who originally presented in the Fall of 2019 with recurrent urinary tract infections and severe left-sided flank pain and was found to have a new onset of left idiopathic UPJ obstruction with moderate to severe hydronephrosis.  Diagnostic ureteroscopy in January 2020 did not show any concerning lesions and cytology was negative.  She has been undergoing yearly stent changes since that time with resolution of her left-sided flank pain.   She has had problems with some recurrent UTIs, however she has never really had dysuria or urinary symptoms but family feels she is more confused at time of infection.  Difficult to separate colonization of stent versus recurrent infections, she also has reportedly had a cough since at least March 2024 that could also contribute to her confusion if related to infection.  Has been on multiple courses of antibiotics.  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 lat. elbow. SCCis arising in [place]. Excised 01/04/2009, margins free.    Squamous cell carcinoma of skin 07/26/2020   right hand    Syncope     Surgical History: Past Surgical History:  Procedure Laterality Date   ABDOMINAL SURGERY  2009   abdominal mass+ NH lymphoma,   BACK SURGERY  01/2017   fusion. metal plate in neck at back   CATARACT EXTRACTION  1997   right eye and left ey   cervical neck fusion  1995   CYSTOSCOPY W/ RETROGRADES Left 01/30/2018   Procedure: CYSTOSCOPY WITH RETROGRADE PYELOGRAM;  Surgeon: Sondra Come, MD;  Location: [facility];  Service: Urology;  Laterality: Left;   CYSTOSCOPY W/ RETROGRADES Left 08/05/2018   Procedure: CYSTOSCOPY WITH RETROGRADE PYELOGRAM;  Surgeon: Sondra Come, MD;  Location: [facility];  Service: Urology;  Laterality: Left;   CYSTOSCOPY W/ RETROGRADES Left 06/01/2021   Procedure: CYSTOSCOPY WITH RETROGRADE PYELOGRAM;  Surgeon: Sondra Come, MD;  Location: [facility];  Service: Urology;  Laterality: Left;   CYSTOSCOPY W/ URETERAL STENT PLACEMENT Left 08/05/2018   Procedure: CYSTOSCOPY WITH STENT Exchange;  Surgeon: Sondra Come, MD;  Location: [facility];  Service: Urology;  Laterality: Left;   CYSTOSCOPY W/ URETERAL STENT PLACEMENT Left 05/21/2019   Procedure: CYSTOSCOPY WITH STENT REPLACEMENT;  Surgeon: Sondra Come, MD;  Location: [facility];  Service: Urology;  Laterality: Left;   CYSTOSCOPY W/ URETERAL STENT PLACEMENT Left 05/05/2020   Procedure: CYSTOSCOPY WITH RETROGRADE PYELOGRAM/URETERAL STENT EXCHANGE;  Surgeon: Sondra Come, MD;  Location: [facility];  Service: Urology;  Laterality: Left;   CYSTOSCOPY W/ URETERAL STENT PLACEMENT Left 06/01/2021   Procedure: CYSTOSCOPY WITH STENT EXCHANGE;  Surgeon: Sondra Come, MD;  Location: [facility];  Service: Urology;  Laterality: Left;   CYSTOSCOPY WITH BIOPSY Left 02/27/2018   Procedure: CYSTOSCOPY WITH BIOPSY;  Surgeon: Sondra Come, MD;  Location: [facility];  Service: Urology;  Laterality: Left;   CYSTOSCOPY WITH STENT PLACEMENT Left 01/30/2018   Procedure: CYSTOSCOPY WITH STENT PLACEMENT;  Surgeon: Sondra Come,  MD;  Location: [facility];  Service: Urology;  Laterality: Left;   CYSTOSCOPY WITH URETEROSCOPY AND STENT PLACEMENT Left 02/27/2018   Procedure: CYSTOSCOPY WITH URETEROSCOPY AND STENT Exchange;  Surgeon: Sondra Come, MD;  Location: [facility];  Service: Urology;  Laterality: Left;   DENTAL SURGERY     screws   JOINT REPLACEMENT Left    knee   KNEE ARTHROPLASTY Right 12/23/2018   Procedure: RIGHT COMPUTER ASSISTED TOTAL KNEE ARTHROPLASTY;  Surgeon: Donato Heinz, MD;  Location: [facility];  Service: Orthopedics;  Laterality: Right;   KYPHOPLASTY N/A 02/21/2017   Procedure: [id number];  Surgeon: Kennedy Bucker, MD;  Location: [facility];  Service: Orthopedics;  Laterality: N/A;   laparotomy with biopsy  03/02/2007   PORTACATH PLACEMENT  2009   SPINE SURGERY  1997   SQUAMOUS CELL CARCINOMA EXCISION     right arm   TOTAL KNEE ARTHROPLASTY Left    2009   URETEROSCOPY Left 01/30/2018   Procedure: URETEROSCOPY;  Surgeon: Sondra Come, MD;  Location: [facility];  Service: Urology;  Laterality: Left;   VAGINAL HYSTERECTOMY  1971     Family History: Family History  Problem Relation Age of Onset   Breast cancer Sister    Dementia Sister    Cataracts Sister    Heart attack Brother    CAD Brother    Heart attack Brother    Leukemia Grandchild        granddaughter   Kidney disease Neg Hx    Bladder Cancer Neg Hx     Social History:  reports that she quit smoking about 33 years ago. Her smoking use included cigarettes. She smoked an average of .25 packs per day. She has been exposed to tobacco smoke. She has never used smokeless tobacco. She reports that she does not currently use alcohol. She reports that she does not use drugs.  Physical Exam: BP (!) 143/92   Pulse 79   Temp 98 F (36.7 C) (Temporal)   Resp 16   Ht 5\' 3"  (1.6 m)   Wt 64 kg   SpO2 98%   BMI 24.98 kg/m    Constitutional:  Alert and oriented, No acute distress.  Elderly and frail-appearing Cardiovascular: Regular  rate and rhythm Respiratory: Clear to auscultation bilaterally GI: Abdomen is soft, nontender, nondistended, no abdominal masses   Laboratory Data: Urine culture 06/25/2022 with E. coli, treated with culture appropriate Keflex   Assessment & Plan:   89 year old female with left UPJ obstruction managed with chronic stent changes, here today for routine stent change.  Risk and benefits discussed including bleeding, infection, stent related symptoms, or inability to replace the stent.  Cystoscopy and left ureteral stent change today  Legrand Rams, MD 07/12/2022  [facility] [address], [address] [place], [place] [address] [phone]

## 2022-07-12 NOTE — Discharge Instructions (Signed)

## 2022-07-13 ENCOUNTER — Encounter: Payer: Self-pay | Admitting: Urology

## 2022-07-19 ENCOUNTER — Other Ambulatory Visit: Payer: Self-pay | Admitting: Family Medicine

## 2022-07-22 NOTE — Telephone Encounter (Signed)
Requested medication (s) are due for refill today: routing for review  Requested medication (s) are on the active medication list: no  Last refill:  unknown  Future visit scheduled: no  Notes to clinic:  Unable to refill per protocol, Rx expired. Medication is not on current list, routing for review.      Requested Prescriptions  Pending Prescriptions Disp Refills   amLODipine (NORVASC) 5 MG tablet [Pharmacy Med Name: AMLODIPINE BESYLATE 5 MG TAB] 30 tablet     Sig: TAKE 1 TABLET BY MOUTH DAILY     Cardiovascular: Calcium Channel Blockers 2 Failed - 07/19/2022  5:42 PM      Failed - Last BP in normal range    BP Readings from Last 1 Encounters:  07/12/22 (!) 142/83         Passed - Last Heart Rate in normal range    Pulse Readings from Last 1 Encounters:  07/12/22 88         Passed - Valid encounter within last 6 months    Recent Outpatient Visits           3 months ago Cystitis with hematuria   [facility] [place], Marzella Schlein, MD   6 months ago Frequent UTI   [facility] [place], [place], [name]   6 months ago Dysuria   [place] [facility] [place], [place], [name]   7 months ago Frequent UTI   [facility] Malva Limes, MD   9 months ago Frequent UTI   [place] [facility] Ronnald Ramp, MD       Future Appointments             In 2 weeks Raechel Chute, MD [facility] [place]   In 2 months Richardo Hanks, Laurette Schimke, MD [facility] [place]

## 2022-08-08 ENCOUNTER — Ambulatory Visit: Payer: Medicare Other | Admitting: Student in an Organized Health Care Education/Training Program

## 2022-08-15 ENCOUNTER — Other Ambulatory Visit: Payer: Self-pay

## 2022-08-15 DIAGNOSIS — R3989 Other symptoms and signs involving the genitourinary system: Secondary | ICD-10-CM

## 2022-08-15 MED ORDER — CEPHALEXIN 500 MG PO CAPS
500.0000 mg | ORAL_CAPSULE | Freq: Two times a day (BID) | ORAL | 0 refills | Status: AC
Start: 2022-08-15 — End: 2022-08-20

## 2022-08-19 ENCOUNTER — Ambulatory Visit (INDEPENDENT_AMBULATORY_CARE_PROVIDER_SITE_OTHER): Payer: Medicare Other

## 2022-08-19 VITALS — Ht 63.0 in | Wt 141.0 lb

## 2022-08-19 DIAGNOSIS — Z0000 Encounter for general adult medical examination without abnormal findings: Secondary | ICD-10-CM | POA: Diagnosis not present

## 2022-08-19 NOTE — Progress Notes (Signed)
Subjective:   Monica Singh is a 89 y.o. female who presents for Medicare Annual (Subsequent) preventive examination.  Visit Complete: Virtual  I connected with  Monica Singh on 08/19/22 by a audio enabled telemedicine application and verified that I am speaking with the correct person using two identifiers.  Patient Location: Home  Provider Location: Home Office  I discussed the limitations of evaluation and management by telemedicine. The patient expressed understanding and agreed to proceed.  Patient Medicare AWV questionnaire was completed by the patient on (not done); I have confirmed that all information answered by patient is correct and no changes since this date.  Review of Systems    Cardiac Risk Factors include: advanced age (>52men, >72 women);hypertension;sedentary lifestyle    Objective:    Today's Vitals   08/19/22 1500  Weight: 141 lb (64 kg)  Height: 5\' 3"  (1.6 m)   Body mass index is 24.98 kg/m.     08/19/2022    3:25 PM 07/12/2022    7:46 AM 07/03/2022    2:27 PM 04/10/2022    6:00 PM 09/05/2021    1:38 PM 08/15/2021    1:35 PM 06/01/2021   11:10 AM  Advanced Directives  Does Patient Have a Medical Advance Directive? Yes Yes Yes Yes No Yes Yes  Type of Estate agent of [place];Living will Healthcare Power of [place];Living will Healthcare Power of [place];Living will Healthcare Power of [place];Living will  Healthcare Power of [place];Living will Living will;Healthcare Power of Attorney  Does patient want to make changes to medical advance directive?  No - Patient declined  No - Patient declined  Yes (Inpatient - patient defers changing a medical advance directive and declines information at this time) No - Patient declined  Copy of Healthcare Power of Attorney in Chart?  Yes - validated most recent copy scanned in chart (See row information)  Yes - validated most recent copy scanned in chart (See row information)  Yes -  validated most recent copy scanned in chart (See row information) No - copy requested    Current Medications (verified) Outpatient Encounter Medications as of 08/19/2022  Medication Sig   amLODipine (NORVASC) 5 MG tablet TAKE 1 TABLET BY MOUTH DAILY   Budeson-Glycopyrrol-Formoterol (BREZTRI AEROSPHERE) 160-9-4.8 MCG/ACT AERO Inhale 2 puffs into the lungs in the morning and at bedtime.   Budeson-Glycopyrrol-Formoterol (BREZTRI AEROSPHERE) 160-9-4.8 MCG/ACT AERO Inhale 2 puffs into the lungs in the morning and at bedtime.   cephALEXin (KEFLEX) 500 MG capsule Take 1 capsule (500 mg total) by mouth 2 (two) times daily for 5 days.   COMBIVENT RESPIMAT 20-100 MCG/ACT AERS respimat INHALE 2  PUFFS INTO THE LUNGS 3 TIMES DAILY   Cyanocobalamin (VITAMIN B-12 CR) 1000 MCG TBCR Take 1 tablet (1,000 mcg total) by mouth daily.   Dextromethorphan-Menthol (DELSYM COUGH RELIEF MT) Use as directed in the mouth or throat.   FOLIC ACID PO Take by mouth.   hydrocortisone 2.5 % cream Apply to affected areas face once to twice daily as directed.   ibuprofen (ADVIL) 200 MG tablet Take 400 mg by mouth every 6 (six) hours as needed.   ketoconazole (NIZORAL) 2 % cream Apply to affected areas face once to twice daily as directed.   Probiotic Product (ALIGN) 4 MG CAPS Take 1 capsule (4 mg total) by mouth daily.   psyllium (HYDROCIL/METAMUCIL) 95 % PACK Take 1 packet by mouth daily.   sodium chloride HYPERTONIC 3 % nebulizer solution Take by nebulization in  the morning and at bedtime.   No facility-administered encounter medications on file as of 08/19/2022.    Allergies (verified) Ciprofloxacin, Milk-related compounds, Latex, Misc. 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 lat. elbow. SCCis arising in [place]. Excised 01/04/2009, margins free.    Squamous cell carcinoma of skin 07/26/2020   right hand   Syncope    Past Surgical History:  Procedure Laterality Date   ABDOMINAL SURGERY  2009   abdominal mass+ NH lymphoma,   BACK SURGERY  01/2017   fusion. metal plate in neck at back   CATARACT EXTRACTION  1997   right eye and left ey   cervical neck fusion  1995   CYSTOSCOPY W/ RETROGRADES Left 01/30/2018   Procedure: CYSTOSCOPY WITH RETROGRADE PYELOGRAM;  Surgeon: Sondra Come, MD;  Location: [facility];  Service: Urology;  Laterality: Left;   CYSTOSCOPY W/ RETROGRADES Left 08/05/2018   Procedure: CYSTOSCOPY WITH RETROGRADE PYELOGRAM;  Surgeon: Sondra Come, MD;  Location: [facility];  Service: Urology;  Laterality: Left;   CYSTOSCOPY W/ RETROGRADES Left 06/01/2021   Procedure: CYSTOSCOPY WITH RETROGRADE PYELOGRAM;  Surgeon: Sondra Come, MD;  Location: [facility];  Service: Urology;  Laterality: Left;   CYSTOSCOPY W/ URETERAL STENT PLACEMENT Left 08/05/2018   Procedure: CYSTOSCOPY WITH STENT Exchange;  Surgeon: Sondra Come, MD;  Location: [facility];  Service: Urology;  Laterality: Left;   CYSTOSCOPY W/ URETERAL STENT PLACEMENT Left 05/21/2019   Procedure: CYSTOSCOPY WITH STENT REPLACEMENT;  Surgeon: Sondra Come, MD;  Location: [facility];  Service: Urology;   Laterality: Left;   CYSTOSCOPY W/ URETERAL STENT PLACEMENT Left 05/05/2020   Procedure: CYSTOSCOPY WITH RETROGRADE PYELOGRAM/URETERAL STENT EXCHANGE;  Surgeon: Sondra Come, MD;  Location: [facility];  Service: Urology;  Laterality: Left;   CYSTOSCOPY W/ URETERAL STENT PLACEMENT Left 06/01/2021   Procedure: CYSTOSCOPY WITH STENT EXCHANGE;  Surgeon: Sondra Come, MD;  Location: [facility];  Service: Urology;  Laterality: Left;   CYSTOSCOPY WITH BIOPSY Left 02/27/2018   Procedure: CYSTOSCOPY WITH BIOPSY;  Surgeon: Sondra Come, MD;  Location: [facility];  Service: Urology;  Laterality: Left;   CYSTOSCOPY WITH STENT PLACEMENT Left 01/30/2018   Procedure: CYSTOSCOPY WITH STENT PLACEMENT;  Surgeon: Sondra Come, MD;  Location: [facility];  Service: Urology;  Laterality: Left;   CYSTOSCOPY WITH STENT PLACEMENT Left 07/12/2022   Procedure: CYSTOSCOPY WITH STENT EXCHANGE;  Surgeon: Sondra Come, MD;  Location: [facility];  Service: Urology;  Laterality: Left;   CYSTOSCOPY  WITH URETEROSCOPY AND STENT PLACEMENT Left 02/27/2018   Procedure: CYSTOSCOPY WITH URETEROSCOPY AND STENT Exchange;  Surgeon: Sondra Come, MD;  Location: [facility];  Service: Urology;  Laterality: Left;   DENTAL SURGERY     screws   JOINT REPLACEMENT Left    knee   KNEE ARTHROPLASTY Right 12/23/2018   Procedure: RIGHT COMPUTER ASSISTED TOTAL KNEE ARTHROPLASTY;  Surgeon: Donato Heinz, MD;  Location: [facility];  Service: Orthopedics;  Laterality: Right;   KYPHOPLASTY N/A 02/21/2017   Procedure: [id number];  Surgeon: Kennedy Bucker, MD;  Location: [facility];  Service: Orthopedics;  Laterality: N/A;   laparotomy with biopsy  03/02/2007   PORTACATH PLACEMENT  2009   SPINE SURGERY  1997   SQUAMOUS CELL CARCINOMA EXCISION     right arm   TOTAL KNEE ARTHROPLASTY Left    2009   URETEROSCOPY Left 01/30/2018   Procedure: URETEROSCOPY;  Surgeon: Sondra Come, MD;  Location: [facility];  Service: Urology;  Laterality: Left;   VAGINAL  HYSTERECTOMY  1971   Family History  Problem Relation Age of Onset   Breast cancer Sister    Dementia Sister    Cataracts Sister    Heart attack Brother    CAD Brother    Heart attack Brother    Leukemia Grandchild        granddaughter   Kidney disease Neg Hx    Bladder Cancer Neg Hx    Social History   Socioeconomic History   Marital status: Widowed    Spouse name: Not on file   Number of children: 5   Years of education: Not on file   Highest education level: GED or equivalent  Occupational History   Occupation: retired    Comment: homemaker  Tobacco Use   Smoking status: Former    Current packs/day: 0.00    Types: Cigarettes    Quit date: 06/21/1989    Years since quitting: 33.1    Passive exposure: Past   Smokeless tobacco: Never  Vaping Use   Vaping status: Never Used  Substance and Sexual Activity   Alcohol use: Not Currently    Comment: not anymore   Drug use: No   Sexual activity: Not Currently    Birth control/protection: Post-menopausal  Other Topics Concern   Not on file  Social History Narrative   Ambulates independently, lives at home by herself   Social Determinants of Health   Financial Resource Strain: Low Risk  (08/19/2022)   Overall Financial Resource Strain (CARDIA)    Difficulty of Paying Living Expenses: Not hard at all  Food Insecurity: No Food Insecurity (08/19/2022)   Hunger Vital Sign    Worried About Running Out of Food in the Last Year: Never true    Ran Out of Food in the Last Year: Never true  Transportation Needs: No Transportation Needs (08/19/2022)   PRAPARE - Administrator, Civil Service (Medical): No    Lack of Transportation (Non-Medical): No  Physical Activity: Insufficiently Active (08/19/2022)   Exercise Vital Sign    Days of Exercise per Week: 2 days    Minutes of Exercise per Session: 30 min  Stress: No Stress Concern Present (08/19/2022)   [employer] of Occupational Health - Occupational Stress  Questionnaire    Feeling of Stress : Not at all  Social Connections: Moderately Isolated (08/19/2022)   Social Connection and Isolation Panel [NHANES]    Frequency of Communication with Friends and Family: Three times a week  Frequency of Social Gatherings with Friends and Family: Three times a week    Attends Religious Services: More than 4 times per year    Active Member of Clubs or Organizations: No    Attends Banker Meetings: Never    Marital Status: Widowed    Tobacco Counseling Counseling given: Not Answered   Clinical Intake:  Pre-visit preparation completed: Yes  Pain : No/denies pain     BMI - recorded: 24.98 Nutritional Status: BMI of 19-24  Normal Nutritional Risks: None Diabetes: No  How often do you need to have someone help you when you read instructions, pamphlets, or other written materials from your doctor or pharmacy?: 1 - Never  Interpreter Needed?: No  Comments: lives at Home place Information entered by :: B.Labrina Lines,LPN   Activities of Daily Living    08/19/2022    3:13 PM 07/03/2022    2:28 PM  In your present state of health, do you have any difficulty performing the following activities:  Hearing? 0   Vision? 0   Difficulty concentrating or making decisions? 1   Walking or climbing stairs? 1   Dressing or bathing? 1   Doing errands, shopping? 1 0  Preparing Food and eating ? Y   Using the Toilet? N   In the past six months, have you accidently leaked urine? Y   Do you have problems with loss of bowel control? N   Managing your Medications? Y   Managing your Finances? Y   Housekeeping or managing your Housekeeping? Y     Patient Care Team: Malva Limes, MD as PCP - General (Family Medicine) End, Cristal Deer, MD as PCP - Cardiology (Cardiology) Sallee Lange, MD as Consulting Physician (Ophthalmology) Sondra Come, MD as Consulting Physician (Urology)  Indicate any recent Medical Services you may have  received from other than [facility] providers in the past year (date may be approximate).     Assessment:   This is a routine wellness examination for Monica.  Hearing/Vision screen Hearing Screening - Comments:: Adequate hearing Vision Screening - Comments:: Adequate vision w/glasses Dr Delon [place]  Dietary issues and exercise activities discussed:     Goals Addressed   None    Depression Screen    08/19/2022    3:10 PM 04/09/2022    1:44 PM 08/28/2021    9:29 AM 08/15/2021    1:33 PM 06/15/2018    2:09 PM 06/15/2018    2:06 PM 05/28/2017    3:14 PM  PHQ 2/9 Scores  PHQ - 2 Score 0 0 0 0 0 0 0  PHQ- 9 Score  4 1 0 3      Fall Risk    08/19/2022    3:04 PM 04/09/2022    1:44 PM 08/28/2021    9:29 AM 08/15/2021    1:37 PM 06/15/2018    2:06 PM  Fall Risk   Falls in the past year? 1 0 1 1 1   Number falls in past yr: 1 0 0 1 1  Injury with Fall? 0 0 1 0   Risk for fall due to : No Fall Risks Impaired balance/gait;Impaired mobility;History of fall(s)  History of fall(s)   Follow up Education provided;Falls prevention discussed Falls evaluation completed  Falls evaluation completed;Falls prevention discussed Falls prevention discussed    MEDICARE RISK AT HOME:  Medicare Risk at Home - 08/19/22 1505     Any stairs in or around the home? No    If so,  are there any without handrails? No    Home free of loose throw rugs in walkways, pet beds, electrical cords, etc? Yes    Adequate lighting in your home to reduce risk of falls? Yes    Life alert? Yes    Use of a cane, walker or w/c? Yes    Grab bars in the bathroom? Yes    Shower chair or bench in shower? Yes    Elevated toilet seat or a handicapped toilet? Yes             TIMED UP AND GO:  Was the test performed?  No    Cognitive Function:        08/19/2022    3:21 PM 06/15/2018    2:15 PM 05/28/2017    3:20 PM 06/07/2016    9:53 AM  6CIT Screen  What Year? 4 points 0 points 0 points 0 points  What month? 3  points 0 points 0 points 0 points  What time? 0 points 0 points 0 points 0 points  Count back from 20 4 points 0 points 0 points 0 points  Months in reverse 4 points 0 points 0 points 0 points  Repeat phrase  0 points 4 points 2 points  Total Score  0 points 4 points 2 points    Immunizations Immunization History  Administered Date(s) Administered   Influenza Split 12/07/2008, 10/17/2010   Influenza, High Dose Seasonal PF 10/19/2013, 10/12/2014, 11/20/2015, 11/12/2016, 11/21/2017, 11/06/2018   Influenza,inj,Quad PF,6+ Mos 11/04/2012   Influenza-Unspecified 09/28/2013, 11/06/2018   PFIZER(Purple Top)SARS-COV-2 Vaccination 02/02/2019, 02/23/2019   Pneumococcal Conjugate-13 12/21/2012   Pneumococcal Polysaccharide-23 12/02/1997   Tdap 10/17/2010   Zoster, Live 10/14/2008    TDAP status: Up to date  Flu Vaccine status: Up to date  Pneumococcal vaccine status: Up to date  Covid-19 vaccine status: Completed vaccines  Qualifies for Shingles Vaccine? Yes   Zostavax completed No   Shingrix Completed?: No.    Education has been provided regarding the importance of this vaccine. Patient has been advised to call insurance company to determine out of pocket expense if they have not yet received this vaccine. Advised may also receive vaccine at local pharmacy or Health Dept. Verbalized acceptance and understanding.  Screening Tests Health Maintenance  Topic Date Due   Zoster Vaccines- Shingrix (1 of 2) 09/10/1947   COVID-19 Vaccine (3 - Pfizer risk series) 03/23/2019   DEXA SCAN  07/21/2020   DTaP/Tdap/Td (2 - Td or Tdap) 10/16/2020   INFLUENZA VACCINE  08/29/2022   Medicare Annual Wellness (AWV)  08/19/2023   Pneumonia Vaccine 69+ Years old  Completed   HPV VACCINES  Aged Out    Health Maintenance  Health Maintenance Due  Topic Date Due   Zoster Vaccines- Shingrix (1 of 2) 09/10/1947   COVID-19 Vaccine (3 - Pfizer risk series) 03/23/2019   DEXA SCAN  07/21/2020   DTaP/Tdap/Td  (2 - Td or Tdap) 10/16/2020    Colorectal cancer screening: No longer required.   Mammogram status: No longer required due to age.   Lung Cancer Screening: (Low Dose CT Chest recommended if Age 57-80 years, 20 pack-year currently smoking OR have quit w/in 15years.) does not qualify.   Lung Cancer Screening Referral: no  Additional Screening:  Hepatitis C Screening: does not qualify; Completed yes  Vision Screening: Recommended annual ophthalmology exams for early detection of glaucoma and other disorders of the eye. Is the patient up to date with their annual eye exam?  Yes  Who is the provider or what is the name of the office in which the patient attends annual eye exams? Dr Arlina Robes If pt is not established with a provider, would they like to be referred to a provider to establish care? No .   Dental Screening: Recommended annual dental exams for proper oral hygiene  Diabetic Foot Exam: n/a  Community Resource Referral / Chronic Care Management: CRR required this visit?  No   CCM required this visit?  No     Plan:     I have personally reviewed and noted the following in the patient's chart:   Medical and social history Use of alcohol, tobacco or illicit drugs  Current medications and supplements including opioid prescriptions. Patient is not currently taking opioid prescriptions. Functional ability and status Nutritional status Physical activity Advanced directives List of other physicians Hospitalizations, surgeries, and ER visits in previous 12 months Vitals Screenings to include cognitive, depression, and falls Referrals and appointments  In addition, I have reviewed and discussed with patient certain preventive protocols, quality metrics, and best practice recommendations. A written personalized care plan for preventive services as well as general preventive health recommendations were provided to patient.     Sue Lush, LPN   1/61/0960   After  Visit Summary: (MyChart) Due to this being a telephonic visit, the after visit summary with patients personalized plan was offered to patient via MyChart   Nurse Notes: I spoke with pt and her daughter Monica Singh, who relay pt lives at the [facility] and is doing well there. They do inquire and relay the need to have handi-placcard. I encouraged to pick up at next appt. Pt does not have any scheduled: daughter will come get the form and submit to be completed

## 2022-08-19 NOTE — Patient Instructions (Signed)
Monica Singh , Thank you for taking time to come for your Medicare Wellness Visit. I appreciate your ongoing commitment to your health goals. Please review the following plan we discussed and let me know if I can assist you in the future.   These are the goals we discussed:  Goals      DIET - EAT MORE FRUITS AND VEGETABLES     DIET - INCREASE WATER INTAKE     Recommend increasing water intake to 4-6 glasses a day.     Increase water intake     Recommend increasing water intake to 4 glasses daily.         This is a list of the screening recommended for you and due dates:  Health Maintenance  Topic Date Due   Zoster (Shingles) Vaccine (1 of 2) 09/10/1947   COVID-19 Vaccine (3 - Pfizer risk series) 03/23/2019   DEXA scan (bone density measurement)  07/21/2020   DTaP/Tdap/Td vaccine (2 - Td or Tdap) 10/16/2020   Flu Shot  08/29/2022   Medicare Annual Wellness Visit  08/19/2023   Pneumonia Vaccine  Completed   HPV Vaccine  Aged Out    Advanced directives: yes  Conditions/risks identified: moderate falls risk  Next appointment: Follow up in one year for your annual wellness visit 08/20/2023 @ 3pm telephone   Preventive Care 65 Years and Older, Female Preventive care refers to lifestyle choices and visits with your health care provider that can promote health and wellness. What does preventive care include? A yearly physical exam. This is also called an annual well check. Dental exams once or twice a year. Routine eye exams. Ask your health care provider how often you should have your eyes checked. Personal lifestyle choices, including: Daily care of your teeth and gums. Regular physical activity. Eating a healthy diet. Avoiding tobacco and drug use. Limiting alcohol use. Practicing safe sex. Taking low-dose aspirin every day. Taking vitamin and mineral supplements as recommended by your health care provider. What happens during an annual well check? The services and  screenings done by your health care provider during your annual well check will depend on your age, overall health, lifestyle risk factors, and family history of disease. Counseling  Your health care provider may ask you questions about your: Alcohol use. Tobacco use. Drug use. Emotional well-being. Home and relationship well-being. Sexual activity. Eating habits. History of falls. Memory and ability to understand (cognition). Work and work Astronomer. Reproductive health. Screening  You may have the following tests or measurements: Height, weight, and BMI. Blood pressure. Lipid and cholesterol levels. These may be checked every 5 years, or more frequently if you are over 77 years old. Skin check. Lung cancer screening. You may have this screening every year starting at age 33 if you have a 30-pack-year history of smoking and currently smoke or have quit within the past 15 years. Fecal occult blood test (FOBT) of the stool. You may have this test every year starting at age 67. Flexible sigmoidoscopy or colonoscopy. You may have a sigmoidoscopy every 5 years or a colonoscopy every 10 years starting at age 65. Hepatitis C blood test. Hepatitis B blood test. Sexually transmitted disease (STD) testing. Diabetes screening. This is done by checking your blood sugar (glucose) after you have not eaten for a while (fasting). You may have this done every 1-3 years. Bone density scan. This is done to screen for osteoporosis. You may have this done starting at age 47. Mammogram. This may be  done every 1-2 years. Talk to your health care provider about how often you should have regular mammograms. Talk with your health care provider about your test results, treatment options, and if necessary, the need for more tests. Vaccines  Your health care provider may recommend certain vaccines, such as: Influenza vaccine. This is recommended every year. Tetanus, diphtheria, and acellular pertussis (Tdap,  Td) vaccine. You may need a Td booster every 10 years. Zoster vaccine. You may need this after age 22. Pneumococcal 13-valent conjugate (PCV13) vaccine. One dose is recommended after age 67. Pneumococcal polysaccharide (PPSV23) vaccine. One dose is recommended after age 38. Talk to your health care provider about which screenings and vaccines you need and how often you need them. This information is not intended to replace advice given to you by your health care provider. Make sure you discuss any questions you have with your health care provider. Document Released: 02/10/2015 Document Revised: 10/04/2015 Document Reviewed: 11/15/2014 Elsevier Interactive Patient Education  2017 [employer].  Fall Prevention in the Home Falls can cause injuries. They can happen to people of all ages. There are many things you can do to make your home safe and to help prevent falls. What can I do on the outside of my home? Regularly fix the edges of walkways and driveways and fix any cracks. Remove anything that might make you trip as you walk through a door, such as a raised step or threshold. Trim any bushes or trees on the path to your home. Use bright outdoor lighting. Clear any walking paths of anything that might make someone trip, such as rocks or tools. Regularly check to see if handrails are loose or broken. Make sure that both sides of any steps have handrails. Any raised decks and porches should have guardrails on the edges. Have any leaves, snow, or ice cleared regularly. Use sand or salt on walking paths during winter. Clean up any spills in your garage right away. This includes oil or grease spills. What can I do in the bathroom? Use night lights. Install grab bars by the toilet and in the tub and shower. Do not use towel bars as grab bars. Use non-skid mats or decals in the tub or shower. If you need to sit down in the shower, use a plastic, non-slip stool. Keep the floor dry. Clean up any  water that spills on the floor as soon as it happens. Remove soap buildup in the tub or shower regularly. Attach bath mats securely with double-sided non-slip rug tape. Do not have throw rugs and other things on the floor that can make you trip. What can I do in the bedroom? Use night lights. Make sure that you have a light by your bed that is easy to reach. Do not use any sheets or blankets that are too big for your bed. They should not hang down onto the floor. Have a firm chair that has side arms. You can use this for support while you get dressed. Do not have throw rugs and other things on the floor that can make you trip. What can I do in the kitchen? Clean up any spills right away. Avoid walking on wet floors. Keep items that you use a lot in easy-to-reach places. If you need to reach something above you, use a strong step stool that has a grab bar. Keep electrical cords out of the way. Do not use floor polish or wax that makes floors slippery. If you must use wax,  use non-skid floor wax. Do not have throw rugs and other things on the floor that can make you trip. What can I do with my stairs? Do not leave any items on the stairs. Make sure that there are handrails on both sides of the stairs and use them. Fix handrails that are broken or loose. Make sure that handrails are as long as the stairways. Check any carpeting to make sure that it is firmly attached to the stairs. Fix any carpet that is loose or worn. Avoid having throw rugs at the top or bottom of the stairs. If you do have throw rugs, attach them to the floor with carpet tape. Make sure that you have a light switch at the top of the stairs and the bottom of the stairs. If you do not have them, ask someone to add them for you. What else can I do to help prevent falls? Wear shoes that: Do not have high heels. Have rubber bottoms. Are comfortable and fit you well. Are closed at the toe. Do not wear sandals. If you use a  stepladder: Make sure that it is fully opened. Do not climb a closed stepladder. Make sure that both sides of the stepladder are locked into place. Ask someone to hold it for you, if possible. Clearly mark and make sure that you can see: Any grab bars or handrails. First and last steps. Where the edge of each step is. Use tools that help you move around (mobility aids) if they are needed. These include: Canes. Walkers. Scooters. Crutches. Turn on the lights when you go into a dark area. Replace any light bulbs as soon as they burn out. Set up your furniture so you have a clear path. Avoid moving your furniture around. If any of your floors are uneven, fix them. If there are any pets around you, be aware of where they are. Review your medicines with your doctor. Some medicines can make you feel dizzy. This can increase your chance of falling. Ask your doctor what other things that you can do to help prevent falls. This information is not intended to replace advice given to you by your health care provider. Make sure you discuss any questions you have with your health care provider. Document Released: 11/10/2008 Document Revised: 06/22/2015 Document Reviewed: 02/18/2014 Elsevier Interactive Patient Education  2017 [employer].

## 2022-08-26 ENCOUNTER — Other Ambulatory Visit: Payer: Self-pay | Admitting: Urology

## 2022-08-26 DIAGNOSIS — N390 Urinary tract infection, site not specified: Secondary | ICD-10-CM

## 2022-09-23 ENCOUNTER — Other Ambulatory Visit: Payer: Medicare Other

## 2022-09-23 DIAGNOSIS — R3989 Other symptoms and signs involving the genitourinary system: Secondary | ICD-10-CM

## 2022-09-23 LAB — URINALYSIS, COMPLETE
Bilirubin, UA: NEGATIVE
Glucose, UA: NEGATIVE
Ketones, UA: NEGATIVE
Nitrite, UA: POSITIVE — AB
Protein,UA: NEGATIVE
Specific Gravity, UA: 1.015 (ref 1.005–1.030)
Urobilinogen, Ur: 0.2 mg/dL (ref 0.2–1.0)
pH, UA: 7.5 (ref 5.0–7.5)

## 2022-09-23 LAB — MICROSCOPIC EXAMINATION: WBC, UA: >30 /hpf — AB (ref 0–5)

## 2022-09-24 ENCOUNTER — Telehealth: Payer: Self-pay | Admitting: *Deleted

## 2022-09-24 NOTE — Telephone Encounter (Signed)
Monica Singh, CMA 09/23/2022  4:36 PM EDT Back to Top    LVM for pt's daughter to return call   Carman Ching, [name] 09/23/2022  4:27 PM EDT     Her daughter brought in a urine specimen from her facility this morning. Her urines always look suspicious because of her ureteral stent. Can you please reach out and find out what symptoms she's having/how severe they are? Are we hoping to start something empirically or wait for the culture to finalize?

## 2022-09-24 NOTE — Telephone Encounter (Signed)
Daughter called back and stated the only symptoms is confusion. Per daughter her mother took off her clothes and walked down the hallway. She will wait on the urine culture.

## 2022-09-26 NOTE — Telephone Encounter (Signed)
Patient's daughter called back to inquire about lab results. She is wanting to get medication before the holiday weekend. Please advise.

## 2022-09-27 NOTE — Telephone Encounter (Signed)
Pt's daughter was informed that culture has not been results yet. We will contact pt's daughter with what antibiotic the provider decides to sent when we get the results. Pt's daughter states Dr.Mitchel prescribes pt, fosfomycin to take once a days for 3 days for UTI.

## 2022-10-03 ENCOUNTER — Encounter: Payer: Self-pay | Admitting: Urology

## 2022-10-03 ENCOUNTER — Ambulatory Visit (INDEPENDENT_AMBULATORY_CARE_PROVIDER_SITE_OTHER): Payer: Medicare Other | Admitting: Urology

## 2022-10-03 VITALS — BP 133/72 | HR 93 | Ht 63.0 in | Wt 137.4 lb

## 2022-10-03 DIAGNOSIS — N390 Urinary tract infection, site not specified: Secondary | ICD-10-CM

## 2022-10-03 DIAGNOSIS — Z09 Encounter for follow-up examination after completed treatment for conditions other than malignant neoplasm: Secondary | ICD-10-CM | POA: Diagnosis not present

## 2022-10-03 DIAGNOSIS — N1330 Unspecified hydronephrosis: Secondary | ICD-10-CM

## 2022-10-03 DIAGNOSIS — Z87448 Personal history of other diseases of urinary system: Secondary | ICD-10-CM

## 2022-10-03 DIAGNOSIS — Z87440 Personal history of urinary (tract) infections: Secondary | ICD-10-CM

## 2022-10-03 MED ORDER — CEPHALEXIN 250 MG PO CAPS: 250.0000 mg | ORAL_CAPSULE | Freq: Every day | ORAL | 3 refills | Status: DC

## 2022-10-03 MED ORDER — FOSFOMYCIN TROMETHAMINE 3 G PO PACK: 3.0000 g | PACK | Freq: Once | ORAL | 3 refills | Status: AC

## 2022-10-03 NOTE — Progress Notes (Signed)
   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  Diagnostic ureteroscopy in January 2020 showed no concerning lesions, cytology was negative.  She has undergone yearly stent changes since that time with improvement of her left-sided flank pain.  She has had some problems with recurrent UTIs, she never has UTI symptoms aside from confusion.  She was unable to use topical estrogen cream secondary to her confusion and forgetfulness, and had no significant improvement with cranberry tablets or Hiprex.  After most recent stent change from 07/12/2022 I started her on low-dose 250 mg daily Keflex prophylaxis.  This has improved her recurrent UTIs significantly, and she has had only 1 UTI in the last 3 months.  She was recently treated for Pseudomonas UTI with fosfomycin which resolved her symptoms of confusion.  I recommended continuing the low-dose daily 250 mg Keflex prophylaxis, and I also provided her daughter with a prescription for fosfomycin as needed for severe confusion felt to be secondary to UTI.  They have had a fair amount of trouble getting prescriptions for infections with her unique symptoms, and I think her daughter will use this judiciously.  RTC 7 months in clinic, plan for yearly stent change~ June 2025  Sondra Come, MD  [facility] [address], [address] [place], [place] [address] [phone]

## 2022-10-03 NOTE — Telephone Encounter (Signed)
Please contact Monica Singh and offer her my sincere apologies for the delay in her mother's urine culture results. I am still awaiting the results, and the preliminary results show she is growing two different bacteria, at least one of them I expect will be very challenging to treat. I could start her on something empirically, but I am very sure I'll end up needing to switch her antibiotic once the results finalize based on what I'm seeing with the preliminary results. If she wants to start something empirically understanding this limitation, please let me know.

## 2022-10-04 ENCOUNTER — Ambulatory Visit (INDEPENDENT_AMBULATORY_CARE_PROVIDER_SITE_OTHER): Payer: Medicare Other | Admitting: Student in an Organized Health Care Education/Training Program

## 2022-10-04 ENCOUNTER — Encounter: Payer: Self-pay | Admitting: Student in an Organized Health Care Education/Training Program

## 2022-10-04 VITALS — BP 120/60 | HR 89 | Temp 97.7°F | Ht 63.0 in | Wt 138.2 lb

## 2022-10-04 DIAGNOSIS — J411 Mucopurulent chronic bronchitis: Secondary | ICD-10-CM

## 2022-10-04 LAB — CULTURE, URINE COMPREHENSIVE

## 2022-10-04 MED ORDER — AEROCHAMBER MV MISC
0 refills | Status: AC
Start: 2022-10-04 — End: ?

## 2022-10-04 MED ORDER — ALBUTEROL SULFATE HFA 108 (90 BASE) MCG/ACT IN AERS
2.0000 | INHALATION_SPRAY | Freq: Four times a day (QID) | RESPIRATORY_TRACT | 2 refills | Status: AC | PRN
Start: 2022-10-04 — End: ?

## 2022-10-04 NOTE — Telephone Encounter (Signed)
Pt's daughter, [name], states they saw Dr.Sninsky yesterday and was put on Keflex.

## 2022-10-04 NOTE — Progress Notes (Unsigned)
Synopsis: Referred in for cough and wheeze by Monica Limes, MD  Assessment & Plan:   1. Mucopurulent chronic bronchitis (HCC)  Presents for the evaluation of cough productive of sputum and shortness of breath since being diagnosed with influenza in March of 2024. She has a history of COPD, and we started Breztri in addition to a pulmonary clearance regimen, with significant improvement. Patient continues to use the regimen though she hasn't used the hypertonic saline in a while. Her symptoms are much improved. We will continue with Breztri and PRN albuterol, and add a spacer device to help with [facility] delivery.   - Spacer/Aero-Holding Chambers (AEROCHAMBER MV) inhaler; Use as instructed  Dispense: 1 each; Refill: 0 - albuterol (VENTOLIN HFA) 108 (90 Base) MCG/ACT inhaler; Inhale 2 puffs into the lungs every 6 (six) hours as needed for wheezing or shortness of breath.  Dispense: 8.5 g; Refill: 2   Return in about 1 year (around 10/04/2023).  I spent 30 minutes caring for this patient today, including preparing to see the patient, obtaining a medical history , reviewing a separately obtained history, performing a medically appropriate examination and/or evaluation, counseling and educating the patient/family/caregiver, ordering medications, tests, or procedures, and documenting clinical information in the electronic health record  Monica Chute, MD [facility]   End of visit medications:  Meds ordered this encounter  Medications   Spacer/Aero-Holding Chambers (AEROCHAMBER MV) inhaler    Sig: Use as instructed    Dispense:  1 each    Refill:  0   albuterol (VENTOLIN HFA) 108 (90 Base) MCG/ACT inhaler    Sig: Inhale 2 puffs into the lungs every 6 (six) hours as needed for wheezing or shortness of breath.    Dispense:  8.5 g    Refill:  2     Current Outpatient Medications:    albuterol (VENTOLIN HFA) 108 (90 Base) MCG/ACT inhaler, Inhale 2 puffs into the lungs  every 6 (six) hours as needed for wheezing or shortness of breath., Disp: 8.5 g, Rfl: 2   amLODipine (NORVASC) 5 MG tablet, TAKE 1 TABLET BY MOUTH DAILY, Disp: 30 tablet, Rfl: 6   Budeson-Glycopyrrol-Formoterol (BREZTRI AEROSPHERE) 160-9-4.8 MCG/ACT AERO, Inhale 2 puffs into the lungs in the morning and at bedtime., Disp: 1 each, Rfl: 12   Budeson-Glycopyrrol-Formoterol (BREZTRI AEROSPHERE) 160-9-4.8 MCG/ACT AERO, Inhale 2 puffs into the lungs in the morning and at bedtime., Disp: 10.7 g, Rfl: 0   cephALEXin (KEFLEX) 250 MG capsule, Take 1 capsule (250 mg total) by mouth daily., Disp: 90 capsule, Rfl: 3   COMBIVENT RESPIMAT 20-100 MCG/ACT AERS respimat, INHALE 2  PUFFS INTO THE LUNGS 3 TIMES DAILY, Disp: 4 g, Rfl: 4   Cyanocobalamin (VITAMIN B-12 CR) 1000 MCG TBCR, Take 1 tablet (1,000 mcg total) by mouth daily., Disp: 30 tablet, Rfl: 12   Dextromethorphan-Menthol (DELSYM COUGH RELIEF MT), Use as directed in the mouth or throat., Disp: , Rfl:    FOLIC ACID PO, Take by mouth., Disp: , Rfl:    hydrocortisone 2.5 % cream, Apply to affected areas face once to twice daily as directed., Disp: 30 g, Rfl: 3   ibuprofen (ADVIL) 200 MG tablet, Take 400 mg by mouth every 6 (six) hours as needed., Disp: , Rfl:    ketoconazole (NIZORAL) 2 % cream, Apply to affected areas face once to twice daily as directed., Disp: 30 g, Rfl: 3   Probiotic Product (ALIGN) 4 MG CAPS, Take 1 capsule (4 mg total) by  mouth daily., Disp: , Rfl:    psyllium (HYDROCIL/METAMUCIL) 95 % PACK, Take 1 packet by mouth daily., Disp: , Rfl:    sodium chloride HYPERTONIC 3 % nebulizer solution, Take by nebulization in the morning and at bedtime., Disp: 750 mL, Rfl: 12   Spacer/Aero-Holding Chambers (AEROCHAMBER MV) inhaler, Use as instructed, Disp: 1 each, Rfl: 0   SPIRIVA HANDIHALER 18 MCG inhalation capsule, Place 18 mcg into inhaler and inhale daily., Disp: , Rfl:    Subjective:   [name] GENDER: female DOB:  09-Sep-1928, MRN: [id number]  Chief Complaint  Patient presents with   Follow-up    Doing well. No current sx.     HPI  Monica Singh is a pleasant 89 year old female presenting to clinic for follow up of cough.  During her prior visit, she and her daughter reported a cough that started following a URTI with persistence over the course of a few months. The cough was productive of white sputum with no report of hemoptysis. We started long acting inhalers with Breztri, added a pulmonary clearance regimen, and added hypertonic saline.  During today's visit, patient and daughter report significant improvement in symptoms. There continues to be an occasional cough and wheeze, though significantly improved compared to prior. She overall feels much better compared to prior.   There are no fevers, chills, night sweats, or weight loss reported. She has no chest pain or chest tightness, but does report an occasional wheeze. Patient does carry a diagnosis of COPD.Marland Kitchen She was admitted to the hospital in March of 2024 where she was found to be influenza B positive. She denies aspiration of food content. Asked about compliance with inhaler and report of certainly taking it in the morning, and possibly in the evening. Reviewed inhaler use with patient and daughter.   She does not have any personal history of asthma, bronchiectasis, or other lung disease. She was told she had COPD in the past. She used to smoke cigarettes, and quit in 1991.   Ancillary information including prior medications, full medical/surgical/family/social histories, and PFTs (when available) are listed below and have been reviewed.   Review of Systems  Constitutional:  Negative for chills, fever, malaise/fatigue and weight loss.  Respiratory:  Positive for wheezing. Negative for cough, hemoptysis, sputum production and shortness of breath.   Cardiovascular:  Negative for chest pain.     Objective:   Vitals:   10/04/22 0911  BP:  120/60  Pulse: 89  Temp: 97.7 F (36.5 C)  TempSrc: Temporal  SpO2: 96%  Weight: 138 lb 3.2 oz (62.7 kg)  Height: 5\' 3"  (1.6 m)   96% on RA BMI Readings from Last 3 Encounters:  10/04/22 24.48 kg/m  10/03/22 24.34 kg/m  08/19/22 24.98 kg/m   Wt Readings from Last 3 Encounters:  10/04/22 138 lb 3.2 oz (62.7 kg)  10/03/22 137 lb 6.4 oz (62.3 kg)  08/19/22 141 lb (64 kg)    Physical Exam Constitutional:      General: She is not in acute distress.    Appearance: Normal appearance. She is not ill-appearing.  Cardiovascular:     Rate and Rhythm: Normal rate and regular rhythm.     Pulses: Normal pulses.     Heart sounds: Normal heart sounds.  Pulmonary:     Effort: Pulmonary effort is normal.     Breath sounds: Wheezing (faint end expiratory wheezes at the basese) and rales present. No rhonchi.  Musculoskeletal:  Right lower leg: No edema.     Left lower leg: No edema.  Neurological:     General: No focal deficit present.     Mental Status: She is alert.     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 lat. elbow. SCCis arising in AK. Excised  01/04/2009, margins free.    Squamous cell carcinoma of skin 07/26/2020   right hand   Syncope      Family History  Problem Relation Age of Onset   Breast cancer Sister    Dementia Sister    Cataracts Sister    Heart attack Brother    CAD Brother    Heart attack Brother    Leukemia Grandchild        granddaughter   Kidney disease Neg Hx    Bladder Cancer Neg Hx      Past Surgical History:  Procedure Laterality Date   ABDOMINAL SURGERY  2009   abdominal mass+ NH lymphoma,   BACK SURGERY  01/2017   fusion. metal plate in neck at back   CATARACT EXTRACTION  1997   right eye and left ey   cervical neck fusion  1995   CYSTOSCOPY W/ RETROGRADES Left 01/30/2018   Procedure: CYSTOSCOPY WITH RETROGRADE PYELOGRAM;  Surgeon: Sondra Come, MD;  Location: [facility];  Service: Urology;  Laterality: Left;   CYSTOSCOPY W/ RETROGRADES Left 08/05/2018   Procedure: CYSTOSCOPY WITH RETROGRADE PYELOGRAM;  Surgeon: Sondra Come, MD;  Location: [facility];  Service: Urology;  Laterality: Left;   CYSTOSCOPY W/ RETROGRADES Left 06/01/2021   Procedure: CYSTOSCOPY WITH RETROGRADE PYELOGRAM;  Surgeon: Sondra Come, MD;  Location: [facility];  Service: Urology;  Laterality: Left;   CYSTOSCOPY W/ URETERAL STENT PLACEMENT Left 08/05/2018   Procedure: CYSTOSCOPY WITH STENT Exchange;  Surgeon: Sondra Come, MD;  Location: [facility];  Service: Urology;  Laterality: Left;   CYSTOSCOPY W/ URETERAL STENT PLACEMENT Left 05/21/2019   Procedure: CYSTOSCOPY WITH STENT REPLACEMENT;  Surgeon: Sondra Come, MD;  Location: [facility];  Service: Urology;  Laterality: Left;   CYSTOSCOPY W/ URETERAL STENT PLACEMENT Left 05/05/2020   Procedure: CYSTOSCOPY WITH RETROGRADE PYELOGRAM/URETERAL STENT EXCHANGE;  Surgeon: Sondra Come, MD;  Location: [facility];  Service: Urology;  Laterality: Left;   CYSTOSCOPY W/ URETERAL STENT PLACEMENT Left 06/01/2021   Procedure: CYSTOSCOPY WITH STENT EXCHANGE;  Surgeon: Sondra Come,  MD;  Location: [facility];  Service: Urology;  Laterality: Left;   CYSTOSCOPY WITH  BIOPSY Left 02/27/2018   Procedure: CYSTOSCOPY WITH BIOPSY;  Surgeon: Sondra Come, MD;  Location: [facility];  Service: Urology;  Laterality: Left;   CYSTOSCOPY WITH STENT PLACEMENT Left 01/30/2018   Procedure: CYSTOSCOPY WITH STENT PLACEMENT;  Surgeon: Sondra Come, MD;  Location: [facility];  Service: Urology;  Laterality: Left;   CYSTOSCOPY WITH STENT PLACEMENT Left 07/12/2022   Procedure: CYSTOSCOPY WITH STENT EXCHANGE;  Surgeon: Sondra Come, MD;  Location: [facility];  Service: Urology;  Laterality: Left;   CYSTOSCOPY WITH URETEROSCOPY AND STENT PLACEMENT Left 02/27/2018   Procedure: CYSTOSCOPY WITH URETEROSCOPY AND STENT Exchange;  Surgeon: Sondra Come, MD;  Location: [facility];  Service: Urology;  Laterality: Left;   DENTAL SURGERY     screws   JOINT REPLACEMENT Left    knee   KNEE ARTHROPLASTY Right 12/23/2018   Procedure: RIGHT COMPUTER ASSISTED TOTAL KNEE ARTHROPLASTY;  Surgeon: Donato Heinz, MD;  Location: [facility];  Service: Orthopedics;  Laterality: Right;   KYPHOPLASTY N/A 02/21/2017   Procedure: [id number];  Surgeon: Kennedy Bucker, MD;  Location: [facility];  Service: Orthopedics;  Laterality: N/A;   laparotomy with biopsy  03/02/2007   PORTACATH PLACEMENT  2009   SPINE SURGERY  1997   SQUAMOUS CELL CARCINOMA EXCISION     right arm   TOTAL KNEE ARTHROPLASTY Left    2009   URETEROSCOPY Left 01/30/2018   Procedure: URETEROSCOPY;  Surgeon: Sondra Come, MD;  Location: [facility];  Service: Urology;  Laterality: Left;   VAGINAL HYSTERECTOMY  1971    Social History   Socioeconomic History   Marital status: Widowed    Spouse name: Not on file   Number of children: 5   Years of education: Not on file   Highest education level: GED or equivalent  Occupational History   Occupation: retired    Comment: homemaker  Tobacco Use   Smoking status: Former    Current packs/day: 0.00     Types: Cigarettes    Quit date: 06/21/1989    Years since quitting: 33.3    Passive exposure: Past   Smokeless tobacco: Never  Vaping Use   Vaping status: Never Used  Substance and Sexual Activity   Alcohol use: Not Currently    Comment: not anymore   Drug use: No   Sexual activity: Not Currently    Birth control/protection: Post-menopausal  Other Topics Concern   Not on file  Social History Narrative   Ambulates independently, lives at home by herself   Social Determinants of Health   Financial Resource Strain: Low Risk  (08/19/2022)   Overall Financial Resource Strain (CARDIA)    Difficulty of Paying Living Expenses: Not hard at all  Food Insecurity: No Food Insecurity (08/19/2022)   Hunger Vital Sign    Worried About Running Out of Food in the Last Year: Never true    Ran Out of Food in the Last Year: Never true  Transportation Needs: No Transportation Needs (08/19/2022)   PRAPARE - Administrator, Civil Service (Medical): No    Lack of Transportation (Non-Medical): No  Physical Activity: Insufficiently Active (08/19/2022)   Exercise Vital Sign    Days of Exercise per Week: 2 days    Minutes of Exercise per Session: 30 min  Stress: No Stress Concern Present (08/19/2022)   [employer] of Occupational Health - Occupational Stress Questionnaire    Feeling of Stress : Not at all  Social Connections: Moderately Isolated (08/19/2022)  Social Connection and Isolation Panel [NHANES]    Frequency of Communication with Friends and Family: Three times a week    Frequency of Social Gatherings with Friends and Family: Three times a week    Attends Religious Services: More than 4 times per year    Active Member of Clubs or Organizations: No    Attends Banker Meetings: Never    Marital Status: Widowed  Intimate Partner Violence: Not At Risk (08/19/2022)   Humiliation, Afraid, Rape, and Kick questionnaire    Fear of Current or Ex-Partner: No    Emotionally  Abused: No    Physically Abused: No    Sexually Abused: No     Allergies  Allergen Reactions   Ciprofloxacin Itching    Whelps on face reported 03/16/2021   Milk-Related Compounds Diarrhea and Nausea And Vomiting    Gas too   Latex Itching   Misc. Sulfonamide Containing Compounds    Milk (Cow) Diarrhea and Nausea And Vomiting    Milk allergy    Sulfa Antibiotics Hives and Itching     CBC    Component Value Date/Time   WBC 6.0 04/14/2022 0320   RBC 3.75 (L) 04/14/2022 0320   HGB 10.3 (L) 04/14/2022 0320   HGB 13.7 12/22/2019 1050   HCT 31.5 (L) 04/14/2022 0320   HCT 40.6 12/22/2019 1050   PLT 286 04/14/2022 0320   PLT 353 12/22/2019 1050   MCV 84.0 04/14/2022 0320   MCV 92 12/22/2019 1050   MCV 93 05/10/2014 1421   MCH 27.5 04/14/2022 0320   MCHC 32.7 04/14/2022 0320   RDW 14.4 04/14/2022 0320   RDW 12.0 12/22/2019 1050   RDW 12.6 05/10/2014 1421   LYMPHSABS 1.5 04/10/2022 0837   LYMPHSABS 1.2 05/10/2014 1421   MONOABS 0.6 04/10/2022 0837   MONOABS 0.5 05/10/2014 1421   EOSABS 0.1 04/10/2022 0837   EOSABS 0.4 05/10/2014 1421   BASOSABS 0.1 04/10/2022 0837   BASOSABS 0.1 05/10/2014 1421    Pulmonary Functions Testing Results:     No data to display          Outpatient Medications Prior to Visit  Medication Sig Dispense Refill   amLODipine (NORVASC) 5 MG tablet TAKE 1 TABLET BY MOUTH DAILY 30 tablet 6   Budeson-Glycopyrrol-Formoterol (BREZTRI AEROSPHERE) 160-9-4.8 MCG/ACT AERO Inhale 2 puffs into the lungs in the morning and at bedtime. 1 each 12   Budeson-Glycopyrrol-Formoterol (BREZTRI AEROSPHERE) 160-9-4.8 MCG/ACT AERO Inhale 2 puffs into the lungs in the morning and at bedtime. 10.7 g 0   cephALEXin (KEFLEX) 250 MG capsule Take 1 capsule (250 mg total) by mouth daily. 90 capsule 3   COMBIVENT RESPIMAT 20-100 MCG/ACT AERS respimat INHALE 2  PUFFS INTO THE LUNGS 3 TIMES DAILY 4 g 4   Cyanocobalamin (VITAMIN B-12 CR) 1000 MCG TBCR Take 1 tablet (1,000 mcg  total) by mouth daily. 30 tablet 12   Dextromethorphan-Menthol (DELSYM COUGH RELIEF MT) Use as directed in the mouth or throat.     FOLIC ACID PO Take by mouth.     hydrocortisone 2.5 % cream Apply to affected areas face once to twice daily as directed. 30 g 3   ibuprofen (ADVIL) 200 MG tablet Take 400 mg by mouth every 6 (six) hours as needed.     ketoconazole (NIZORAL) 2 % cream Apply to affected areas face once to twice daily as directed. 30 g 3   Probiotic Product (ALIGN) 4 MG CAPS Take 1 capsule (4 mg  total) by mouth daily.     psyllium (HYDROCIL/METAMUCIL) 95 % PACK Take 1 packet by mouth daily.     sodium chloride HYPERTONIC 3 % nebulizer solution Take by nebulization in the morning and at bedtime. 750 mL 12   SPIRIVA HANDIHALER 18 MCG inhalation capsule Place 18 mcg into inhaler and inhale daily.     No facility-administered medications prior to visit.

## 2022-10-05 ENCOUNTER — Other Ambulatory Visit: Payer: Self-pay | Admitting: Urology

## 2022-10-05 DIAGNOSIS — N390 Urinary tract infection, site not specified: Secondary | ICD-10-CM

## 2022-10-08 ENCOUNTER — Other Ambulatory Visit: Payer: Self-pay | Admitting: Family Medicine

## 2022-10-22 ENCOUNTER — Other Ambulatory Visit: Payer: Self-pay

## 2022-10-22 ENCOUNTER — Emergency Department: Payer: Medicare Other

## 2022-10-22 ENCOUNTER — Emergency Department
Admission: EM | Admit: 2022-10-22 | Discharge: 2022-10-22 | Disposition: A | Discharge status: Other Acute Inpatient Hospital | Payer: Medicare Other | Attending: Emergency Medicine | Admitting: Emergency Medicine

## 2022-10-22 DIAGNOSIS — J029 Acute pharyngitis, unspecified: Secondary | ICD-10-CM | POA: Insufficient documentation

## 2022-10-22 DIAGNOSIS — M25551 Pain in right hip: Secondary | ICD-10-CM | POA: Diagnosis not present

## 2022-10-22 DIAGNOSIS — S0993XA Unspecified injury of face, initial encounter: Secondary | ICD-10-CM | POA: Diagnosis present

## 2022-10-22 DIAGNOSIS — S0083XA Contusion of other part of head, initial encounter: Secondary | ICD-10-CM | POA: Insufficient documentation

## 2022-10-22 DIAGNOSIS — M25511 Pain in right shoulder: Secondary | ICD-10-CM | POA: Diagnosis not present

## 2022-10-22 DIAGNOSIS — W19XXXA Unspecified fall, initial encounter: Secondary | ICD-10-CM | POA: Insufficient documentation

## 2022-10-22 MED ORDER — AMOXICILLIN-POT CLAVULANATE 875-125 MG PO TABS: 1.0000 | ORAL_TABLET | Freq: Two times a day (BID) | ORAL | 0 refills | Status: AC

## 2022-10-22 NOTE — Discharge Instructions (Addendum)
Please use ibuprofen (Motrin) up to 800 mg every 8 hours, naproxen (Naprosyn) up to 500 mg every 12 hours, and/or acetaminophen (Tylenol) up to 4 g/day for any continued pain.  Please do not use this medication regimen for longer than 7 days

## 2022-10-22 NOTE — ED Notes (Signed)
See triage notes. Patient stated she was putting on her pants this morning when she fell hitting the right side of her head, right shoulder and right hip. Patient denies LOC and states does not take blood thinners.

## 2022-10-22 NOTE — ED Provider Notes (Signed)
[facility] Provider Note   Event Date/Time   First MD Initiated Contact with Patient 10/22/22 1002     (approximate) History  Fall and Shoulder Pain  HPI Monica Singh is a 89 y.o. female who presents after a fall at her "home [facility]".  Patient's caregiver at bedside and states that she was told by staff patient was found on the floor after what looks to be an attempt to put her pants on.  Patient does not have any recollection of the fall and states that she is unsteady when she is getting up and down and usually uses a walker.  Patient now complains of of right shoulder pain when touched and right hip pain.  Patient denies any blood thinner use. ROS: Patient currently denies any vision changes, tinnitus, difficulty speaking, facial droop, sore throat, chest pain, shortness of breath, abdominal pain, nausea/vomiting/diarrhea, dysuria, or weakness/numbness/paresthesias in any extremity   Physical Exam  Triage Vital Signs: ED Triage Vitals  Encounter Vitals Group     BP 10/22/22 0950 (!) 146/79     Systolic BP Percentile --      Diastolic BP Percentile --      Pulse Rate 10/22/22 0950 88     Resp 10/22/22 0950 16     Temp 10/22/22 0950 98 F (36.7 C)     Temp Source 10/22/22 0950 Oral     SpO2 10/22/22 0950 93 %     Weight --      Height --      Head Circumference --      Peak Flow --      Pain Score 10/22/22 0951 3     Pain Loc --      Pain Education --      Exclude from Growth Chart --    Most recent vital signs: Vitals:   10/22/22 0950  BP: (!) 146/79  Pulse: 88  Resp: 16  Temp: 98 F (36.7 C)  SpO2: 93%   General: Awake, oriented x4. CV:  Good peripheral perfusion.  Resp:  Normal effort.  Abd:  No distention.  Other:  Elderly well-developed, well-nourished Caucasian female resting comfortably in no acute distress.  Ecchymosis noted to the right periorbital region.  Tenderness to palpation overlying the right shoulder and  right hip ED Results / Procedures / Treatments  Labs (all labs ordered are listed, but only abnormal results are displayed) Labs Reviewed - No data to display RADIOLOGY ED MD interpretation: X-ray of the right shoulder and right hip show no evidence of acute fracture or dislocation  CT of the head without contrast interpreted by me shows no evidence of acute abnormalities including no intracerebral hemorrhage, obvious masses, or significant edema  CT of the cervical spine interpreted by me does not show any evidence of acute abnormalities including no acute fracture, malalignment, height loss, or dislocation  CT of the maxillofacial structures interpreted independently by me and shows no evidence of acute abnormalities including no fracture, malalignment, sinus hemorrhage, or significant edema -Agree with radiology assessment Official radiology report(s): DG Hip Unilat W or Wo Pelvis 2-3 Views Right  Result Date: 10/22/2022 CLINICAL DATA:  Post fall, now with right hip pain. EXAM: DG HIP (WITH OR WITHOUT PELVIS) 2-3V RIGHT COMPARISON:  None Available. FINDINGS: No fracture or dislocation. Mild degenerative change of the right hip with joint space loss, subchondral sclerosis and osteophytosis. No evidence of avascular necrosis. Limited visualization of the pelvis is normal. Similar  mild degenerative changes suspected within the contralateral left hip, incompletely evaluated. The distal end of the left-sided double-J ureteral stent overlies the expected location of the left side of the urinary bladder. Phleboliths overlie the left hemipelvis. Scattered adjacent vascular calcifications. Degenerative change of the lower lumbar spine is suspected though incompletely evaluated. IMPRESSION: 1. No acute findings. 2. Mild degenerative change of the right hip. Electronically Signed   By: Simonne Come M.D.   On: 10/22/2022 13:03   DG Shoulder Right  Result Date: 10/22/2022 CLINICAL DATA:  Post fall, now  with right shoulder pain. EXAM: RIGHT SHOULDER - 2+ VIEW COMPARISON:  None FINDINGS: No fracture or dislocation. Mild degenerative change of the right glenohumeral joint with joint space loss, subchondral sclerosis and osteophytosis. Acromioclavicular joint spaces appear preserved. Dystrophic calcifications overlies the expected location of the insertional fibers of the rotator cuff. Limited visualization of the adjacent thorax is normal. Regional soft tissues appear normal. IMPRESSION: 1. No acute findings. 2. Mild degenerative change of the right glenohumeral joint. 3. Calcific tendinopathy of the rotator cuff. Electronically Signed   By: Simonne Come M.D.   On: 10/22/2022 13:02   CT Head Wo Contrast  Result Date: 10/22/2022 CLINICAL DATA:  Provided history: Head trauma, minor. Facial trauma, blunt. Neck trauma. Additional history provided: Fall (with head trauma), hematoma on right side of head. EXAM: CT HEAD WITHOUT CONTRAST CT MAXILLOFACIAL WITHOUT CONTRAST CT CERVICAL SPINE WITHOUT CONTRAST TECHNIQUE: Multidetector CT imaging of the head, cervical spine, and maxillofacial structures were performed using the standard protocol without intravenous contrast. Multiplanar CT image reconstructions of the cervical spine and maxillofacial structures were also generated. RADIATION DOSE REDUCTION: This exam was performed according to the departmental dose-optimization program which includes automated exposure control, adjustment of the mA and/or kV according to patient size and/or use of iterative reconstruction technique. COMPARISON:  Head CT 04/10/2022.  Cervical spine CT 04/10/2022. FINDINGS: CT HEAD FINDINGS Brain: Generalized cerebral and cerebellar atrophy. Commensurate prominence of the ventricles and sulci. Patchy and ill-defined hypoattenuation within the cerebral white matter, nonspecific but compatible with advanced chronic small vessel ischemic disease. There is no acute intracranial hemorrhage. No  demarcated cortical infarct. No extra-axial fluid collection. No evidence of an intracranial mass. No midline shift. Vascular: No hyperdense vessel.  Atherosclerotic calcifications. Skull: No calvarial fracture or aggressive osseous lesion. Other: Right anterior scalp/periorbital hematoma. CT MAXILLOFACIAL FINDINGS Osseous: No acute maxillofacial fracture is identified. Orbits: Right anterior scalp/periorbital hematoma. Sinuses: Minimal mucosal thickening within the bilateral maxillary sinuses. Small-volume frothy secretions within the right sphenoid sinus. Mild mucosal thickening, and small-volume fluid, scattered within bilateral ethmoid air cells. Mild mucosal thickening within the bilateral frontal sinuses. Soft tissues: Right anterior scalp/periorbital hematoma. CT CERVICAL SPINE FINDINGS Alignment: Levocurvature of the cervical spine. Partially imaged dextrocurvature of the thoracic spine. 3 mm grade 1 anterolisthesis at C2-C3 and C3-C4. Slight grade 1 retrolisthesis at C5-C6. 2 mm C7-T1 grade 1 anterolisthesis. Skull base and vertebrae: The basion-dental and atlanto-dental intervals are maintained.No evidence of acute fracture to the cervical spine. Prior C4-C5 ACDF. Soft tissues and spinal canal: No prevertebral fluid or swelling. No visible canal hematoma. Disc levels: Prior C4-C5 ACDF. No evidence of acute hardware compromise. Solid-appearing arthrodesis at this level. Cervical spondylosis with multilevel disc space narrowing, disc bulges/central disc protrusions, uncovertebral hypertrophy and facet arthrosis. Disc space narrowing is greatest at the non-operative levels at C3-C4 (moderate-to-advanced) and C5-C6 (advanced). No appreciable high-grade spinal canal stenosis. Uncovertebral hypertrophy results in bony neural foraminal narrowing on  the left at C5-C6. Degenerative changes are also present at the C1-C2 articulation. Upper chest: No consolidation within the imaged lung apices. Emphysema. IMPRESSION:  CT head: 1. No evidence of an acute intracranial abnormality. 2. Right anterior scalp/periorbital hematoma. 3. Parenchymal atrophy and chronic small vessel ischemic disease, as described. CT maxillofacial: 1. No evidence of an acute maxillofacial fracture. 2. Right anterior scalp/periorbital hematoma. 3. Paranasal sinus disease as described. CT cervical spine: 1. No evidence of an acute cervical spine fracture. 2. Grade 1 spondylolisthesis at C2-C3, C3-C4, C5-C6 and C7-T1. 3. Levocurvature of the cervical spine. 4. Partially imaged dextrocurvature of the thoracic spine. 5. Prior C4-C5 ACDF. 6. Cervical spondylosis as described. Electronically Signed   By: Jackey Loge D.O.   On: 10/22/2022 12:53   CT Cervical Spine Wo Contrast  Result Date: 10/22/2022 CLINICAL DATA:  Provided history: Head trauma, minor. Facial trauma, blunt. Neck trauma. Additional history provided: Fall (with head trauma), hematoma on right side of head. EXAM: CT HEAD WITHOUT CONTRAST CT MAXILLOFACIAL WITHOUT CONTRAST CT CERVICAL SPINE WITHOUT CONTRAST TECHNIQUE: Multidetector CT imaging of the head, cervical spine, and maxillofacial structures were performed using the standard protocol without intravenous contrast. Multiplanar CT image reconstructions of the cervical spine and maxillofacial structures were also generated. RADIATION DOSE REDUCTION: This exam was performed according to the departmental dose-optimization program which includes automated exposure control, adjustment of the mA and/or kV according to patient size and/or use of iterative reconstruction technique. COMPARISON:  Head CT 04/10/2022.  Cervical spine CT 04/10/2022. FINDINGS: CT HEAD FINDINGS Brain: Generalized cerebral and cerebellar atrophy. Commensurate prominence of the ventricles and sulci. Patchy and ill-defined hypoattenuation within the cerebral white matter, nonspecific but compatible with advanced chronic small vessel ischemic disease. There is no acute  intracranial hemorrhage. No demarcated cortical infarct. No extra-axial fluid collection. No evidence of an intracranial mass. No midline shift. Vascular: No hyperdense vessel.  Atherosclerotic calcifications. Skull: No calvarial fracture or aggressive osseous lesion. Other: Right anterior scalp/periorbital hematoma. CT MAXILLOFACIAL FINDINGS Osseous: No acute maxillofacial fracture is identified. Orbits: Right anterior scalp/periorbital hematoma. Sinuses: Minimal mucosal thickening within the bilateral maxillary sinuses. Small-volume frothy secretions within the right sphenoid sinus. Mild mucosal thickening, and small-volume fluid, scattered within bilateral ethmoid air cells. Mild mucosal thickening within the bilateral frontal sinuses. Soft tissues: Right anterior scalp/periorbital hematoma. CT CERVICAL SPINE FINDINGS Alignment: Levocurvature of the cervical spine. Partially imaged dextrocurvature of the thoracic spine. 3 mm grade 1 anterolisthesis at C2-C3 and C3-C4. Slight grade 1 retrolisthesis at C5-C6. 2 mm C7-T1 grade 1 anterolisthesis. Skull base and vertebrae: The basion-dental and atlanto-dental intervals are maintained.No evidence of acute fracture to the cervical spine. Prior C4-C5 ACDF. Soft tissues and spinal canal: No prevertebral fluid or swelling. No visible canal hematoma. Disc levels: Prior C4-C5 ACDF. No evidence of acute hardware compromise. Solid-appearing arthrodesis at this level. Cervical spondylosis with multilevel disc space narrowing, disc bulges/central disc protrusions, uncovertebral hypertrophy and facet arthrosis. Disc space narrowing is greatest at the non-operative levels at C3-C4 (moderate-to-advanced) and C5-C6 (advanced). No appreciable high-grade spinal canal stenosis. Uncovertebral hypertrophy results in bony neural foraminal narrowing on the left at C5-C6. Degenerative changes are also present at the C1-C2 articulation. Upper chest: No consolidation within the imaged lung  apices. Emphysema. IMPRESSION: CT head: 1. No evidence of an acute intracranial abnormality. 2. Right anterior scalp/periorbital hematoma. 3. Parenchymal atrophy and chronic small vessel ischemic disease, as described. CT maxillofacial: 1. No evidence of an acute maxillofacial fracture. 2. Right anterior scalp/periorbital  hematoma. 3. Paranasal sinus disease as described. CT cervical spine: 1. No evidence of an acute cervical spine fracture. 2. Grade 1 spondylolisthesis at C2-C3, C3-C4, C5-C6 and C7-T1. 3. Levocurvature of the cervical spine. 4. Partially imaged dextrocurvature of the thoracic spine. 5. Prior C4-C5 ACDF. 6. Cervical spondylosis as described. Electronically Signed   By: Jackey Loge D.O.   On: 10/22/2022 12:53   CT Maxillofacial WO CM  Result Date: 10/22/2022 CLINICAL DATA:  Provided history: Head trauma, minor. Facial trauma, blunt. Neck trauma. Additional history provided: Fall (with head trauma), hematoma on right side of head. EXAM: CT HEAD WITHOUT CONTRAST CT MAXILLOFACIAL WITHOUT CONTRAST CT CERVICAL SPINE WITHOUT CONTRAST TECHNIQUE: Multidetector CT imaging of the head, cervical spine, and maxillofacial structures were performed using the standard protocol without intravenous contrast. Multiplanar CT image reconstructions of the cervical spine and maxillofacial structures were also generated. RADIATION DOSE REDUCTION: This exam was performed according to the departmental dose-optimization program which includes automated exposure control, adjustment of the mA and/or kV according to patient size and/or use of iterative reconstruction technique. COMPARISON:  Head CT 04/10/2022.  Cervical spine CT 04/10/2022. FINDINGS: CT HEAD FINDINGS Brain: Generalized cerebral and cerebellar atrophy. Commensurate prominence of the ventricles and sulci. Patchy and ill-defined hypoattenuation within the cerebral white matter, nonspecific but compatible with advanced chronic small vessel ischemic disease.  There is no acute intracranial hemorrhage. No demarcated cortical infarct. No extra-axial fluid collection. No evidence of an intracranial mass. No midline shift. Vascular: No hyperdense vessel.  Atherosclerotic calcifications. Skull: No calvarial fracture or aggressive osseous lesion. Other: Right anterior scalp/periorbital hematoma. CT MAXILLOFACIAL FINDINGS Osseous: No acute maxillofacial fracture is identified. Orbits: Right anterior scalp/periorbital hematoma. Sinuses: Minimal mucosal thickening within the bilateral maxillary sinuses. Small-volume frothy secretions within the right sphenoid sinus. Mild mucosal thickening, and small-volume fluid, scattered within bilateral ethmoid air cells. Mild mucosal thickening within the bilateral frontal sinuses. Soft tissues: Right anterior scalp/periorbital hematoma. CT CERVICAL SPINE FINDINGS Alignment: Levocurvature of the cervical spine. Partially imaged dextrocurvature of the thoracic spine. 3 mm grade 1 anterolisthesis at C2-C3 and C3-C4. Slight grade 1 retrolisthesis at C5-C6. 2 mm C7-T1 grade 1 anterolisthesis. Skull base and vertebrae: The basion-dental and atlanto-dental intervals are maintained.No evidence of acute fracture to the cervical spine. Prior C4-C5 ACDF. Soft tissues and spinal canal: No prevertebral fluid or swelling. No visible canal hematoma. Disc levels: Prior C4-C5 ACDF. No evidence of acute hardware compromise. Solid-appearing arthrodesis at this level. Cervical spondylosis with multilevel disc space narrowing, disc bulges/central disc protrusions, uncovertebral hypertrophy and facet arthrosis. Disc space narrowing is greatest at the non-operative levels at C3-C4 (moderate-to-advanced) and C5-C6 (advanced). No appreciable high-grade spinal canal stenosis. Uncovertebral hypertrophy results in bony neural foraminal narrowing on the left at C5-C6. Degenerative changes are also present at the C1-C2 articulation. Upper chest: No consolidation within  the imaged lung apices. Emphysema. IMPRESSION: CT head: 1. No evidence of an acute intracranial abnormality. 2. Right anterior scalp/periorbital hematoma. 3. Parenchymal atrophy and chronic small vessel ischemic disease, as described. CT maxillofacial: 1. No evidence of an acute maxillofacial fracture. 2. Right anterior scalp/periorbital hematoma. 3. Paranasal sinus disease as described. CT cervical spine: 1. No evidence of an acute cervical spine fracture. 2. Grade 1 spondylolisthesis at C2-C3, C3-C4, C5-C6 and C7-T1. 3. Levocurvature of the cervical spine. 4. Partially imaged dextrocurvature of the thoracic spine. 5. Prior C4-C5 ACDF. 6. Cervical spondylosis as described. Electronically Signed   By: Jackey Loge D.O.   On: 10/22/2022 12:53  PROCEDURES: Critical Care performed: No .1-3 Lead EKG Interpretation  Performed by: Merwyn Katos, MD Authorized by: Merwyn Katos, MD     Interpretation: normal     ECG rate:  71   ECG rate assessment: normal     Rhythm: sinus rhythm     Ectopy: none     Conduction: normal    MEDICATIONS ORDERED IN ED: Medications - No data to display IMPRESSION / MDM / ASSESSMENT AND PLAN / ED COURSE  I reviewed the triage vital signs and the nursing notes.                             The patient is on the cardiac monitor to evaluate for evidence of arrhythmia and/or significant heart rate changes. Patient's presentation is most consistent with acute presentation with potential threat to life or bodily function. Presenting after a fall that occurred just prior to arrival, resulting in injury to the right periorbital area. The mechanism of injury was a mechanical ground level fall without syncope or near-syncope. The current level of pain is moderate. There was no loss of consciousness, confusion, seizure, or memory impairment. There is not a laceration associated with the injury. Denies neck pain. The patient does not take blood thinner medications. Denies  vomiting, numbness/weakness, fever  Dispo: Discharge with PCP follow-up       FINAL CLINICAL IMPRESSION(S) / ED DIAGNOSES   Final diagnoses:  Fall, initial encounter  Acute pain of right shoulder  Acute hip pain, right  Contusion of face, initial encounter  Pharyngitis, unspecified etiology   Rx / DC Orders   ED Discharge Orders          Ordered    amoxicillin-clavulanate (AUGMENTIN) 875-125 MG tablet  2 times daily        10/22/22 1315           Note:  This document was prepared using Dragon voice recognition software and may include unintentional dictation errors.   Merwyn Katos, MD 10/22/22 1320

## 2022-10-22 NOTE — ED Triage Notes (Signed)
First Nurse Note: Pt via [facility] from [employer]. Pt was up getting ready, states she was pulling up her pant. Pt fell on R side, Pt c/o R shoulder pain, R hip pain with no deformity, did hit her head and has a hematoma on R side of her head. Denies blood thinners. Denies LOC. Denies lightheadedness. Pt is A&Ox4 and NAD 145/78 BP  100% on RA 90 HR

## 2022-10-23 ENCOUNTER — Ambulatory Visit: Payer: Medicare Other | Admitting: Physician Assistant

## 2022-10-27 ENCOUNTER — Emergency Department
Admission: EM | Admit: 2022-10-27 | Discharge: 2022-10-27 | Disposition: A | Discharge status: Home / Self Care | Payer: Medicare Other | Attending: Emergency Medicine | Admitting: Emergency Medicine

## 2022-10-27 ENCOUNTER — Other Ambulatory Visit: Payer: Self-pay

## 2022-10-27 ENCOUNTER — Emergency Department: Payer: Medicare Other

## 2022-10-27 DIAGNOSIS — M25551 Pain in right hip: Secondary | ICD-10-CM | POA: Insufficient documentation

## 2022-10-27 DIAGNOSIS — I700 Atherosclerosis of aorta: Secondary | ICD-10-CM | POA: Diagnosis not present

## 2022-10-27 DIAGNOSIS — M25511 Pain in right shoulder: Secondary | ICD-10-CM | POA: Insufficient documentation

## 2022-10-27 NOTE — ED Provider Notes (Signed)
[facility] Provider Note    Event Date/Time   First MD Initiated Contact with Patient 10/27/22 1557     (approximate)   History   Hip Pain (RIGHT)   HPI Monica Singh is a 89 y.o. female presenting today for ongoing right hip pain.  Patient had a fall 5 days ago and was seen in the emergency department.  At that time she got CT imagings of her head and neck and x-rays of her hip.  No acute traumatic findings with negative x-rays of the right hip.  She has had ongoing right hip pain and followed up with [facility] today.  They were concerned for possible occult fracture and sent her here for CT imaging.  Patient denies any new falls.  No tenderness palpation at the right hip but has pain when ambulating on that side.  No pain throughout the rest of her extremities.  No new swelling or numbness.  Reviewed notes and imaging results from most recent ED visit.     Physical Exam   Triage Vital Signs: ED Triage Vitals  Encounter Vitals Group     BP 10/27/22 1540 115/62     Systolic BP Percentile --      Diastolic BP Percentile --      Pulse Rate 10/27/22 1540 84     Resp 10/27/22 1540 18     Temp 10/27/22 1540 98.7 F (37.1 C)     Temp src --      SpO2 10/27/22 1540 92 %     Weight 10/27/22 1541 145 lb (65.8 kg)     Height 10/27/22 1541 5\' 3"  (1.6 m)     Head Circumference --      Peak Flow --      Pain Score 10/27/22 1540 0     Pain Loc --      Pain Education --      Exclude from Growth Chart --     Most recent vital signs: Vitals:   10/27/22 1540  BP: 115/62  Pulse: 84  Resp: 18  Temp: 98.7 F (37.1 C)  SpO2: 92%   I have reviewed the vital signs. General:  Awake, alert, no acute distress. Head:  Normocephalic, recent traumatic injury to the face with old bruising present.  No new traumatic injuries. EENT:  PERRL, EOMI, Oral mucosa pink and moist, Neck is supple. Cardiovascular: Regular rate, 2+ distal pulses. Respiratory:   Normal respiratory effort, symmetrical expansion, no distress.   Extremities:  Moving all four extremities through full ROM without pain.  No overt tenderness palpation at the right hip or anywhere on the right lower extremity.  No obvious deformity. Neuro:  Alert and oriented.  Interacting appropriately.   Skin:  Warm, dry, no rash.   Psych: Appropriate affect.    ED Results / Procedures / Treatments   Labs (all labs ordered are listed, but only abnormal results are displayed) Labs Reviewed - No data to display   EKG    RADIOLOGY Independently interpreted CT abdomen/pelvis with no acute pathology.   PROCEDURES:  Critical Care performed: No  Procedures   MEDICATIONS ORDERED IN ED: Medications - No data to display   IMPRESSION / MDM / ASSESSMENT AND PLAN / ED COURSE  I reviewed the triage vital signs and the nursing notes.  Differential diagnosis includes, but is not limited to, occult fracture, soft tissue hematoma, continued right hip pain  Patient's presentation is most consistent with acute complicated illness / injury requiring diagnostic workup.  Patient is a 89 year old female presenting today for ongoing right hip pain from a fall 5 days ago.  Had negative x-ray at that time.  Presenting today for concern of possible occult fracture.  CT pelvis performed which shows no evidence of fracture at the hip or pelvis.  Patient otherwise stable and safe for discharge.  Will plan for follow-up at her facility with her PCP as well as physical therapy there.  Given strict return precautions.  Clinical Course as of 10/27/22 1651  Sun Oct 27, 2022  1642 CT PELVIS WO CONTRAST Independently interpreted with no obvious fracture [DW]    Clinical Course User Index [DW] Janith Lima, MD     FINAL CLINICAL IMPRESSION(S) / ED DIAGNOSES   Final diagnoses:  Right hip pain     Rx / DC Orders   ED Discharge Orders     None         Note:  This document was prepared using Dragon voice recognition software and may include unintentional dictation errors.   Janith Lima, MD 10/27/22 [phone]

## 2022-10-27 NOTE — Discharge Instructions (Addendum)
CT imaging of your right hip shows no evidence of any broken bones.  Please follow-up with your regular doctor and have them work with physical therapy at your facility

## 2022-10-27 NOTE — ED Triage Notes (Addendum)
Pt to ed from [facility] via POV with family for a fall that occurred on Tuesday. Pt was seen here on Tuesday for same. She had imaging done and there was no fracture. Pt was taken to [facility] and they sent her here for possible CT since Xrays showed no fracture. Pt is caox4, in no acute distress in triage. Pt feels like she is having a hard time walking and increased pain in the right hip.

## 2022-11-13 ENCOUNTER — Other Ambulatory Visit: Payer: Self-pay | Admitting: Family Medicine

## 2022-11-14 NOTE — Telephone Encounter (Signed)
Requested medication (s) are due for refill today: routing for review  Requested medication (s) are on the active medication list: no  Last refill:  unknown  Future visit scheduled: no  Notes to clinic:  Unable to refill per protocol, Rx expired. Medication not on current list, routing for approval.      Requested Prescriptions  Pending Prescriptions Disp Refills   methenamine (HIPREX) 1 g tablet [Pharmacy Med Name: METHENAMINE HIPPURATE 1 GM TAB] 60 tablet     Sig: TAKE ONE (1) TABLET BY MOUTH TWO TIMES PER DAY     Urology: Bladder Agents - methenamine hippurate Passed - 11/13/2022  6:43 PM      Passed - ALT in normal range and within 360 days    ALT  Date Value Ref Range Status  04/10/2022 16 0 - 44 U/L Final   SGPT (ALT)  Date Value Ref Range Status  05/10/2014 17 U/L Final    Comment:    14-54 NOTE: New Reference Range  04/05/14          Passed - AST in normal range and within 360 days    AST  Date Value Ref Range Status  04/10/2022 23 15 - 41 U/L Final   SGOT(AST)  Date Value Ref Range Status  05/10/2014 19 U/L Final    Comment:    15-41 NOTE: New Reference Range  04/05/14          Passed - Cr in normal range and within 360 days    Creat  Date Value Ref Range Status  11/12/2016 0.74 0.60 - 0.88 mg/dL Final    Comment:    For patients >56 years of age, the reference limit for Creatinine is approximately 13% higher for people identified as African-American. .    Creatinine, Ser  Date Value Ref Range Status  04/15/2022 0.76 0.44 - 1.00 mg/dL Final         Passed - Valid encounter within last 12 months    Recent Outpatient Visits           7 months ago Cystitis with hematuria   [facility] [place], Marzella Schlein, MD   10 months ago Frequent UTI   [facility] [place], [place], [name]   10 months ago Dysuria   [facility] [place], [place], [name]   11 months ago Frequent  UTI   [facility] Malva Limes, MD   1 year ago Frequent UTI   [place] [facility] [place] Ronnald Ramp, MD       Future Appointments             In 1 week Elie Goody, MD [facility]   In 5 months Richardo Hanks, Laurette Schimke, MD [facility] [place]

## 2022-11-16 ENCOUNTER — Inpatient Hospital Stay
Admission: EM | Admit: 2022-11-16 | Discharge: 2022-11-27 | DRG: 871 | Disposition: A | Discharge status: Skilled Nursing Facility | Payer: Medicare Other | Attending: Internal Medicine | Admitting: Internal Medicine

## 2022-11-16 ENCOUNTER — Inpatient Hospital Stay: Payer: Medicare Other

## 2022-11-16 ENCOUNTER — Emergency Department: Payer: Medicare Other

## 2022-11-16 ENCOUNTER — Other Ambulatory Visit: Payer: Self-pay

## 2022-11-16 DIAGNOSIS — Z1152 Encounter for screening for COVID-19: Secondary | ICD-10-CM

## 2022-11-16 DIAGNOSIS — Z803 Family history of malignant neoplasm of breast: Secondary | ICD-10-CM

## 2022-11-16 DIAGNOSIS — Z8249 Family history of ischemic heart disease and other diseases of the circulatory system: Secondary | ICD-10-CM

## 2022-11-16 DIAGNOSIS — B9620 Unspecified Escherichia coli [E. coli] as the cause of diseases classified elsewhere: Secondary | ICD-10-CM | POA: Diagnosis present

## 2022-11-16 DIAGNOSIS — Z9221 Personal history of antineoplastic chemotherapy: Secondary | ICD-10-CM

## 2022-11-16 DIAGNOSIS — G9341 Metabolic encephalopathy: Secondary | ICD-10-CM | POA: Diagnosis present

## 2022-11-16 DIAGNOSIS — J449 Chronic obstructive pulmonary disease, unspecified: Secondary | ICD-10-CM | POA: Diagnosis present

## 2022-11-16 DIAGNOSIS — K449 Diaphragmatic hernia without obstruction or gangrene: Secondary | ICD-10-CM | POA: Diagnosis present

## 2022-11-16 DIAGNOSIS — D649 Anemia, unspecified: Secondary | ICD-10-CM | POA: Diagnosis present

## 2022-11-16 DIAGNOSIS — Z87891 Personal history of nicotine dependence: Secondary | ICD-10-CM

## 2022-11-16 DIAGNOSIS — Z1613 Resistance to carbapenem: Secondary | ICD-10-CM | POA: Diagnosis present

## 2022-11-16 DIAGNOSIS — Z91011 Allergy to milk products: Secondary | ICD-10-CM

## 2022-11-16 DIAGNOSIS — Z8572 Personal history of non-Hodgkin lymphomas: Secondary | ICD-10-CM | POA: Diagnosis not present

## 2022-11-16 DIAGNOSIS — I340 Nonrheumatic mitral (valve) insufficiency: Secondary | ICD-10-CM | POA: Diagnosis present

## 2022-11-16 DIAGNOSIS — F0390 Unspecified dementia without behavioral disturbance: Secondary | ICD-10-CM | POA: Diagnosis present

## 2022-11-16 DIAGNOSIS — Z882 Allergy status to sulfonamides status: Secondary | ICD-10-CM

## 2022-11-16 DIAGNOSIS — E871 Hypo-osmolality and hyponatremia: Secondary | ICD-10-CM | POA: Diagnosis present

## 2022-11-16 DIAGNOSIS — Z96653 Presence of artificial knee joint, bilateral: Secondary | ICD-10-CM | POA: Diagnosis present

## 2022-11-16 DIAGNOSIS — A419 Sepsis, unspecified organism: Principal | ICD-10-CM | POA: Diagnosis present

## 2022-11-16 DIAGNOSIS — N390 Urinary tract infection, site not specified: Secondary | ICD-10-CM | POA: Diagnosis present

## 2022-11-16 DIAGNOSIS — D4959 Neoplasm of unspecified behavior of other genitourinary organ: Secondary | ICD-10-CM | POA: Diagnosis present

## 2022-11-16 DIAGNOSIS — C859A Non-Hodgkin lymphoma, unspecified, in remission: Secondary | ICD-10-CM | POA: Diagnosis present

## 2022-11-16 DIAGNOSIS — Z806 Family history of leukemia: Secondary | ICD-10-CM

## 2022-11-16 DIAGNOSIS — N9489 Other specified conditions associated with female genital organs and menstrual cycle: Secondary | ICD-10-CM | POA: Diagnosis present

## 2022-11-16 DIAGNOSIS — N3090 Cystitis, unspecified without hematuria: Principal | ICD-10-CM

## 2022-11-16 DIAGNOSIS — E039 Hypothyroidism, unspecified: Secondary | ICD-10-CM | POA: Diagnosis present

## 2022-11-16 DIAGNOSIS — Z91040 Latex allergy status: Secondary | ICD-10-CM

## 2022-11-16 DIAGNOSIS — Z85828 Personal history of other malignant neoplasm of skin: Secondary | ICD-10-CM

## 2022-11-16 DIAGNOSIS — R6520 Severe sepsis without septic shock: Secondary | ICD-10-CM | POA: Diagnosis present

## 2022-11-16 DIAGNOSIS — Z66 Do not resuscitate: Secondary | ICD-10-CM | POA: Diagnosis present

## 2022-11-16 DIAGNOSIS — N12 Tubulo-interstitial nephritis, not specified as acute or chronic: Secondary | ICD-10-CM | POA: Diagnosis present

## 2022-11-16 DIAGNOSIS — N189 Chronic kidney disease, unspecified: Secondary | ICD-10-CM | POA: Diagnosis present

## 2022-11-16 DIAGNOSIS — E038 Other specified hypothyroidism: Secondary | ICD-10-CM | POA: Diagnosis present

## 2022-11-16 DIAGNOSIS — E876 Hypokalemia: Secondary | ICD-10-CM | POA: Diagnosis present

## 2022-11-16 DIAGNOSIS — K5730 Diverticulosis of large intestine without perforation or abscess without bleeding: Secondary | ICD-10-CM | POA: Diagnosis present

## 2022-11-16 DIAGNOSIS — F03C0 Unspecified dementia, severe, without behavioral disturbance, psychotic disturbance, mood disturbance, and anxiety: Secondary | ICD-10-CM | POA: Insufficient documentation

## 2022-11-16 DIAGNOSIS — Z79899 Other long term (current) drug therapy: Secondary | ICD-10-CM

## 2022-11-16 DIAGNOSIS — I10 Essential (primary) hypertension: Secondary | ICD-10-CM | POA: Diagnosis not present

## 2022-11-16 DIAGNOSIS — I129 Hypertensive chronic kidney disease with stage 1 through stage 4 chronic kidney disease, or unspecified chronic kidney disease: Secondary | ICD-10-CM | POA: Diagnosis present

## 2022-11-16 DIAGNOSIS — Z960 Presence of urogenital implants: Secondary | ICD-10-CM | POA: Diagnosis present

## 2022-11-16 DIAGNOSIS — B965 Pseudomonas (aeruginosa) (mallei) (pseudomallei) as the cause of diseases classified elsewhere: Secondary | ICD-10-CM | POA: Diagnosis present

## 2022-11-16 DIAGNOSIS — Z981 Arthrodesis status: Secondary | ICD-10-CM

## 2022-11-16 LAB — URINALYSIS, ROUTINE W REFLEX MICROSCOPIC
Bilirubin Urine: NEGATIVE
Glucose, UA: NEGATIVE mg/dL
Ketones, ur: NEGATIVE mg/dL
Nitrite: POSITIVE — AB
Protein, ur: 30 mg/dL — AB
Specific Gravity, Urine: 1.016 (ref 1.005–1.030)
WBC, UA: >50 WBC/hpf (ref 0–5)
pH: 5 (ref 5.0–8.0)

## 2022-11-16 LAB — HEPATIC FUNCTION PANEL
ALT: 10 U/L (ref 0–44)
AST: 14 U/L — ABNORMAL LOW (ref 15–41)
Albumin: 3.5 g/dL (ref 3.5–5.0)
Alkaline Phosphatase: 56 U/L (ref 38–126)
Bilirubin, Direct: 0.2 mg/dL (ref 0.0–0.2)
Indirect Bilirubin: 0.5 mg/dL (ref 0.3–0.9)
Total Bilirubin: 0.7 mg/dL (ref 0.3–1.2)
Total Protein: 7 g/dL (ref 6.5–8.1)

## 2022-11-16 LAB — SARS CORONAVIRUS 2 BY RT PCR: SARS Coronavirus 2 by RT PCR: NEGATIVE

## 2022-11-16 LAB — CBC
HCT: 36.4 % (ref 36.0–46.0)
Hemoglobin: 11.9 g/dL — ABNORMAL LOW (ref 12.0–15.0)
MCH: 28.9 pg (ref 26.0–34.0)
MCHC: 32.7 g/dL (ref 30.0–36.0)
MCV: 88.3 fL (ref 80.0–100.0)
Platelets: 318 10*3/uL (ref 150–400)
RBC: 4.12 MIL/uL (ref 3.87–5.11)
RDW: 13.2 % (ref 11.5–15.5)
WBC: 11.5 10*3/uL — ABNORMAL HIGH (ref 4.0–10.5)
nRBC: 0 % (ref 0.0–0.2)

## 2022-11-16 LAB — BASIC METABOLIC PANEL
Anion gap: 11 (ref 5–15)
BUN: 13 mg/dL (ref 8–23)
CO2: 22 mmol/L (ref 22–32)
Calcium: 8.5 mg/dL — ABNORMAL LOW (ref 8.9–10.3)
Chloride: 96 mmol/L — ABNORMAL LOW (ref 98–111)
Creatinine, Ser: 0.91 mg/dL (ref 0.44–1.00)
GFR, Estimated: 59 mL/min — ABNORMAL LOW (ref >60)
Glucose, Bld: 173 mg/dL — ABNORMAL HIGH (ref 70–99)
Potassium: 3.7 mmol/L (ref 3.5–5.1)
Sodium: 129 mmol/L — ABNORMAL LOW (ref 135–145)

## 2022-11-16 LAB — LACTIC ACID, PLASMA: Lactic Acid, Venous: 0.9 mmol/L (ref 0.5–1.9)

## 2022-11-16 LAB — PROCALCITONIN: Procalcitonin: 0.21 ng/mL

## 2022-11-16 LAB — LIPASE, BLOOD: Lipase: 22 U/L (ref 11–51)

## 2022-11-16 MED ORDER — MOMETASONE FURO-FORMOTEROL FUM 100-5 MCG/ACT IN AERO
2.0000 | INHALATION_SPRAY | Freq: Two times a day (BID) | RESPIRATORY_TRACT | Status: DC
  Administered 2022-11-17 – 2022-11-27 (×18): 2 via RESPIRATORY_TRACT
  Filled 2022-11-16: qty 8.8

## 2022-11-16 MED ORDER — SODIUM CHLORIDE 1 G PO TABS
1.0000 g | ORAL_TABLET | Freq: Two times a day (BID) | ORAL | Status: DC
  Administered 2022-11-17 – 2022-11-23 (×13): 1 g via ORAL
  Filled 2022-11-16 (×13): qty 1

## 2022-11-16 MED ORDER — ENOXAPARIN SODIUM 40 MG/0.4ML IJ SOSY
40.0000 mg | PREFILLED_SYRINGE | INTRAMUSCULAR | Status: DC
Start: 2022-11-16 — End: 2022-11-27
  Administered 2022-11-17 – 2022-11-26 (×10): 40 mg via SUBCUTANEOUS
  Filled 2022-11-16 (×10): qty 0.4

## 2022-11-16 MED ORDER — BUDESON-GLYCOPYRROL-FORMOTEROL 160-9-4.8 MCG/ACT IN AERO: 2.0000 | INHALATION_SPRAY | Freq: Every day | RESPIRATORY_TRACT | Status: DC

## 2022-11-16 MED ORDER — ACETAMINOPHEN 325 MG PO TABS
650.0000 mg | ORAL_TABLET | Freq: Once | ORAL | Status: AC
  Administered 2022-11-16: 650 mg via ORAL
  Filled 2022-11-16: qty 2

## 2022-11-16 MED ORDER — DM-GUAIFENESIN ER 30-600 MG PO TB12
1.0000 | ORAL_TABLET | Freq: Two times a day (BID) | ORAL | Status: DC | PRN
  Administered 2022-11-19: 1 via ORAL
  Filled 2022-11-16: qty 1

## 2022-11-16 MED ORDER — LACTATED RINGERS IV BOLUS
1000.0000 mL | Freq: Once | INTRAVENOUS | Status: AC
  Administered 2022-11-16: 1000 mL via INTRAVENOUS

## 2022-11-16 MED ORDER — SODIUM CHLORIDE 0.9 % IV SOLN
2.0000 g | INTRAVENOUS | Status: DC
  Administered 2022-11-16: 2 g via INTRAVENOUS
  Filled 2022-11-16: qty 20

## 2022-11-16 MED ORDER — IOHEXOL 300 MG/ML  SOLN
100.0000 mL | Freq: Once | INTRAMUSCULAR | Status: AC | PRN
  Administered 2022-11-16: 100 mL via INTRAVENOUS

## 2022-11-16 MED ORDER — ONDANSETRON HCL 4 MG/2ML IJ SOLN
4.0000 mg | Freq: Three times a day (TID) | INTRAMUSCULAR | Status: DC | PRN
  Administered 2022-11-17: 4 mg via INTRAVENOUS
  Filled 2022-11-16: qty 2

## 2022-11-16 MED ORDER — ALBUTEROL SULFATE (2.5 MG/3ML) 0.083% IN NEBU: 2.5000 mg | INHALATION_SOLUTION | RESPIRATORY_TRACT | Status: DC | PRN

## 2022-11-16 MED ORDER — ACETAMINOPHEN 325 MG PO TABS
650.0000 mg | ORAL_TABLET | Freq: Four times a day (QID) | ORAL | Status: DC | PRN
  Administered 2022-11-26: 650 mg via ORAL
  Filled 2022-11-16 (×2): qty 2

## 2022-11-16 MED ORDER — TIOTROPIUM BROMIDE MONOHYDRATE 18 MCG IN CAPS
18.0000 ug | ORAL_CAPSULE | Freq: Every day | RESPIRATORY_TRACT | Status: DC
  Administered 2022-11-17 – 2022-11-27 (×11): 18 ug via RESPIRATORY_TRACT
  Filled 2022-11-16 (×3): qty 5

## 2022-11-16 MED ORDER — SODIUM CHLORIDE 0.9 % IV BOLUS (SEPSIS)
1000.0000 mL | Freq: Once | INTRAVENOUS | Status: AC
  Administered 2022-11-16: 1000 mL via INTRAVENOUS

## 2022-11-16 MED ORDER — SODIUM CHLORIDE 0.9 % IV SOLN
2.0000 g | Freq: Two times a day (BID) | INTRAVENOUS | Status: DC
  Administered 2022-11-16 – 2022-11-18 (×4): 2 g via INTRAVENOUS
  Filled 2022-11-16 (×4): qty 12.5

## 2022-11-16 MED ORDER — ACETAMINOPHEN 500 MG PO TABS
1000.0000 mg | ORAL_TABLET | Freq: Once | ORAL | Status: AC
  Administered 2022-11-16: 1000 mg via ORAL
  Filled 2022-11-16: qty 2

## 2022-11-16 MED ORDER — IPRATROPIUM-ALBUTEROL 20-100 MCG/ACT IN AERS: 1.0000 | INHALATION_SPRAY | Freq: Three times a day (TID) | RESPIRATORY_TRACT | Status: DC

## 2022-11-16 MED ORDER — HALOPERIDOL LACTATE 5 MG/ML IJ SOLN
1.0000 mg | Freq: Once | INTRAMUSCULAR | Status: AC
  Administered 2022-11-16: 1 mg via INTRAVENOUS
  Filled 2022-11-16: qty 1

## 2022-11-16 MED ORDER — HYDRALAZINE HCL 20 MG/ML IJ SOLN: 5.0000 mg | INTRAMUSCULAR | Status: DC | PRN

## 2022-11-16 NOTE — Consult Note (Signed)
Pharmacy Antibiotic Note  Monica Singh is a 89 y.o. female admitted on 11/16/2022 with UTI.  Patient has a history of e.coli and carbapenem-resistant pseudomonas UTI. Pharmacy has been consulted for Cefepime dosing.  Plan: Cefepime 2g Q12 hours  Will adjust dose and de-escalate antibiotics as appropriate.  Height: 5\' 3"  (160 cm) Weight: 65 kg (143 lb 4.8 oz) IBW/kg (Calculated) : 52.4  Temp (24hrs), Avg:99 F (37.2 C), Min:98 F (36.7 C), Max:100.4 F (38 C)  Recent Labs  Lab 11/16/22 1424 11/16/22 1700  WBC 11.5*  --   CREATININE 0.91  --   LATICACIDVEN  --  0.9    Estimated Creatinine Clearance: 35.7 mL/min (by C-G formula based on SCr of 0.91 mg/dL).    Allergies  Allergen Reactions   Ciprofloxacin Itching    Whelps on face reported 03/16/2021   Milk-Related Compounds Diarrhea and Nausea And Vomiting    Gas too   Latex Itching   Misc. Sulfonamide Containing Compounds    Milk (Cow) Diarrhea and Nausea And Vomiting    Milk allergy    Sulfa Antibiotics Hives and Itching    Antimicrobials this admission: Ceftriaxone 10/19 x 1 Cefepime 10/19 >>   Dose adjustments this admission: N/A  Microbiology results: 10/19 BCx: collected   Thank you for allowing pharmacy to be a part of this patient's care.  Bettey Costa 11/16/2022 9:49 PM

## 2022-11-16 NOTE — ED Triage Notes (Signed)
See first nurse note, pt oriented to person only. Febrile on arrival. Reports neg covid test today. Family reports thinks it is UTI

## 2022-11-16 NOTE — H&P (Signed)
History and Physical    [place] IHK:[id number] DOB: April 07, 1928 DOA: 11/16/2022  Referring MD/NP/PA:   PCP: Malva Limes, MD   Patient coming from:  The patient is coming from ALF   Chief Complaint: Altered mental status, weakness, fever  HPI: Monica Singh is a 89 y.o. female with medical history significant of recurrent UTI, left-sided nephroureteral stent in place, HTN, COPD, subclinical hypothyroidism, dementia, hyponatremia, non-Hodgkin lymphoma, adnexal mass, who presents with altered mental status and weakness, fever.  Per her daughter at the bedside, patient is noted to be more confused than her baseline with weakness today.  At her normal baseline, patient is orientated to the person and place, always confused about time. Pt is normally awake and ambulatory with a walker. Today, pt is so weak that she stay in the bed all the time.  When I saw pt in ED, patient is confused, able to recognizes her daughter, but not orientated to the place and time.  Patient is agitated in ED. She has fever and chills.  Her temperature is 100.4 in ED.  She has poor appetite and decreased oral intake.  No chest pain or abdominal pain per her daughter, but patient seems to have tenderness in the right lower abdomen on examination.  No acute respiratory distress or cough noted.  Patient has vomited once, no diarrhea.  Not sure if patient has symptoms of UTI.  Data reviewed independently and ED Course: pt was found to have positive urinalysis (cloudy appearance, large amount of leukocyte, positive nitrite, many bacteria, WBC> 50), WBC 11.5, creatinine 0.91, BUN 13, GFR 59, sodium 129, lactic acid is 0.9, negative PCR for COVID.  Temperature 100.4, blood pressure 118/70, heart rate of 108--> 87, RR 18, oxygen sat 96% on room air.  Chest x-ray negative.  Patient is admitted to MedSurg bed as inpatient.  CT-abdomen/pelvis 1. Mild enhancement of the right renal pelvis and proximal  ureter, suspicious for urinary tract infection. Recommend correlation with urinalysis. 2. Left-sided nephroureteral stent in place. Mild chronic dilatation of the left renal pelvis. 3. Left adnexal cyst measuring 7.1 x 5.1 cm, minimally increased in size from 2020 exam when it measured 5.9 x 4.4 cm. Pelvic ultrasound can be performed for follow-up as clinically indicated given patient's advanced age. 4. Moderate to large hiatal hernia. 5. Colonic diverticulosis without diverticulitis.   EKG: Not done in ED, will get one.      Review of Systems: Could not be reviewed accurately due to dementia and altered mental status.  Allergy:  Allergies  Allergen Reactions   Ciprofloxacin Itching    Whelps on face reported 03/16/2021   Milk-Related Compounds Diarrhea and Nausea And Vomiting    Gas too   Latex Itching   Misc. 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 lat. elbow. SCCis arising in AK. Excised 01/04/2009, margins free.    Squamous cell carcinoma of skin 07/26/2020   right hand   Syncope      Past Surgical History:  Procedure Laterality Date   ABDOMINAL SURGERY  2009   abdominal mass+ NH lymphoma,   BACK SURGERY  01/2017   fusion. metal plate in neck at back   CATARACT EXTRACTION  1997   right eye and left ey   cervical neck fusion  1995   CYSTOSCOPY W/ RETROGRADES Left 01/30/2018   Procedure: CYSTOSCOPY WITH RETROGRADE PYELOGRAM;  Surgeon: Sondra Come, MD;  Location: [facility];  Service: Urology;  Laterality: Left;   CYSTOSCOPY W/ RETROGRADES Left 08/05/2018   Procedure: CYSTOSCOPY WITH RETROGRADE PYELOGRAM;  Surgeon: Sondra Come, MD;  Location: [facility];  Service: Urology;  Laterality: Left;   CYSTOSCOPY W/ RETROGRADES Left 06/01/2021   Procedure: CYSTOSCOPY WITH RETROGRADE PYELOGRAM;  Surgeon: Sondra Come, MD;  Location: [facility];  Service: Urology;  Laterality: Left;   CYSTOSCOPY W/ URETERAL STENT PLACEMENT Left 08/05/2018   Procedure: CYSTOSCOPY WITH STENT Exchange;  Surgeon: Sondra Come, MD;  Location: [facility];  Service: Urology;  Laterality: Left;   CYSTOSCOPY W/ URETERAL STENT PLACEMENT Left 05/21/2019   Procedure: CYSTOSCOPY WITH STENT REPLACEMENT;  Surgeon: Sondra Come, MD;  Location: [facility];  Service: Urology;  Laterality: Left;   CYSTOSCOPY W/ URETERAL STENT PLACEMENT Left 05/05/2020   Procedure: CYSTOSCOPY WITH RETROGRADE PYELOGRAM/URETERAL STENT EXCHANGE;  Surgeon: Sondra Come, MD;  Location: [facility];  Service: Urology;  Laterality: Left;   CYSTOSCOPY W/ URETERAL STENT PLACEMENT Left 06/01/2021   Procedure: CYSTOSCOPY WITH STENT EXCHANGE;  Surgeon: Sondra Come, MD;  Location: [facility];  Service: Urology;  Laterality: Left;   CYSTOSCOPY WITH BIOPSY Left 02/27/2018   Procedure: CYSTOSCOPY WITH BIOPSY;  Surgeon: Sondra Come, MD;  Location: [facility];  Service: Urology;  Laterality: Left;   CYSTOSCOPY WITH STENT PLACEMENT Left 01/30/2018   Procedure: CYSTOSCOPY WITH STENT PLACEMENT;  Surgeon: Sondra Come, MD;  Location: [facility];   Service: Urology;  Laterality: Left;   CYSTOSCOPY WITH STENT PLACEMENT Left 07/12/2022   Procedure: CYSTOSCOPY WITH STENT EXCHANGE;  Surgeon: Sondra Come, MD;  Location: [facility];  Service: Urology;  Laterality: Left;   CYSTOSCOPY WITH URETEROSCOPY AND STENT PLACEMENT Left 02/27/2018   Procedure: CYSTOSCOPY WITH URETEROSCOPY AND STENT Exchange;  Surgeon: Sondra Come, MD;  Location: [facility];  Service: Urology;  Laterality: Left;   DENTAL SURGERY     screws   JOINT REPLACEMENT Left    knee   KNEE ARTHROPLASTY Right 12/23/2018   Procedure: RIGHT COMPUTER ASSISTED TOTAL KNEE ARTHROPLASTY;  Surgeon: Donato Heinz, MD;  Location: [facility];  Service: Orthopedics;  Laterality: Right;   KYPHOPLASTY N/A 02/21/2017   Procedure: [id number];  Surgeon: Kennedy Bucker, MD;  Location: [facility];  Service: Orthopedics;  Laterality: N/A;   laparotomy with biopsy  03/02/2007   PORTACATH PLACEMENT  2009   SPINE SURGERY  1997   SQUAMOUS CELL CARCINOMA EXCISION  right arm   TOTAL KNEE ARTHROPLASTY Left    2009   URETEROSCOPY Left 01/30/2018   Procedure: URETEROSCOPY;  Surgeon: Sondra Come, MD;  Location: [facility];  Service: Urology;  Laterality: Left;   VAGINAL HYSTERECTOMY  1971    Social History:  reports that she quit smoking about 33 years ago. Her smoking use included cigarettes. She has been exposed to tobacco smoke. She has never used smokeless tobacco. She reports that she does not currently use alcohol. She reports that she does not use drugs.  Family History:  Family History  Problem Relation Age of Onset   Breast cancer Sister    Dementia Sister    Cataracts Sister    Heart attack Brother    CAD Brother    Heart attack Brother    Leukemia Grandchild        granddaughter   Kidney disease Neg Hx    Bladder Cancer Neg Hx      Prior to Admission medications   Medication Sig Start Date End Date Taking? Authorizing Provider  albuterol (VENTOLIN HFA) 108 (90 Base) MCG/ACT  inhaler Inhale 2 puffs into the lungs every 6 (six) hours as needed for wheezing or shortness of breath. 10/04/22   Raechel Chute, MD  amLODipine (NORVASC) 5 MG tablet TAKE 1 TABLET BY MOUTH DAILY 07/22/22   Malva Limes, MD  Budeson-Glycopyrrol-Formoterol (BREZTRI AEROSPHERE) 160-9-4.8 MCG/ACT AERO Inhale 2 puffs into the lungs in the morning and at bedtime. 07/01/22   Raechel Chute, MD  Budeson-Glycopyrrol-Formoterol (BREZTRI AEROSPHERE) 160-9-4.8 MCG/ACT AERO Inhale 2 puffs into the lungs in the morning and at bedtime. 07/01/22   Raechel Chute, MD  cephALEXin (KEFLEX) 250 MG capsule Take 1 capsule (250 mg total) by mouth daily. 10/03/22   Sondra Come, MD  COMBIVENT RESPIMAT 20-100 MCG/ACT AERS respimat INHALE 2  PUFFS INTO THE LUNGS 3 TIMES DAILY 11/29/21   Malva Limes, MD  Cyanocobalamin (VITAMIN B-12 CR) 1000 MCG TBCR Take 1 tablet (1,000 mcg total) by mouth daily. 06/28/22   Malva Limes, MD  Dextromethorphan-Menthol [facility] COUGH RELIEF MT) Use as directed in the mouth or throat.    [provider]  FOLIC ACID PO Take by mouth.    [provider]  hydrocortisone 2.5 % cream Apply to affected areas face once to twice daily as directed. 06/19/22   Willeen Niece, MD  ibuprofen (ADVIL) 200 MG tablet Take 400 mg by mouth every 6 (six) hours as needed.    [provider]  ketoconazole (NIZORAL) 2 % cream Apply to affected areas face once to twice daily as directed. 06/19/22   Willeen Niece, MD  methenamine (HIPREX) 1 g tablet TAKE ONE (1) TABLET BY MOUTH TWO TIMES PER DAY 11/14/22   McGowan, Carollee Herter A, [name]  Probiotic Product (ALIGN) 4 MG CAPS Take 1 capsule (4 mg total) by mouth daily. 12/04/21   Malva Limes, MD  psyllium (HYDROCIL/METAMUCIL) 95 % PACK Take 1 packet by mouth daily.    [provider]  sodium chloride HYPERTONIC 3 % nebulizer solution Take by nebulization in the morning and at bedtime. 07/01/22   Raechel Chute, MD  Spacer/Aero-Holding  Chambers (AEROCHAMBER MV) inhaler Use as instructed 10/04/22   Raechel Chute, MD  SPIRIVA HANDIHALER 18 MCG inhalation capsule Place 18 mcg into inhaler and inhale daily. 09/24/22   [provider]    Physical Exam: Vitals:   11/16/22 1730 11/16/22 1830 11/16/22 1920 11/16/22 2043  BP: Marland Kitchen)  102/55 100/70 118/70   Pulse: 82 81 87   Resp: 18 18 18    Temp:    98 F (36.7 C)  TempSrc:    Oral  SpO2: 94% 98% 96%   Weight:      Height:       General: Not in acute distress HEENT:       Eyes: PERRL, EOMI, no jaundice       ENT: No discharge from the ears and nose, no pharynx injection, no tonsillar enlargement.        Neck: No JVD, no bruit, no mass felt. Heme: No neck lymph node enlargement. Cardiac: S1/S2, RRR, No murmurs, No gallops or rubs. Respiratory: No rales, wheezing, rhonchi or rubs. GI: Soft, nondistended, seems to have tenderness in the right lower abdomen.  No rebound pain, no organomegaly, BS present. GU: No hematuria Ext: No pitting leg edema bilaterally. 1+DP/PT pulse bilaterally. Musculoskeletal: No joint deformities, No joint redness or warmth, no limitation of ROM in spin. Skin: No rashes.  Neuro: Confused, agitated, recognize her daughter, not oriented to place and time. Cranial nerves II-XII grossly intact, moves all extremities.   Psych: Patient is not psychotic, no suicidal or hemocidal ideation.  Labs on Admission: I have personally reviewed following labs and imaging studies  CBC: Recent Labs  Lab 11/16/22 1424  WBC 11.5*  HGB 11.9*  HCT 36.4  MCV 88.3  PLT 318   Basic Metabolic Panel: Recent Labs  Lab 11/16/22 1424  NA 129*  K 3.7  CL 96*  CO2 22  GLUCOSE 173*  BUN 13  CREATININE 0.91  CALCIUM 8.5*   GFR: Estimated Creatinine Clearance: 35.7 mL/min (by C-G formula based on SCr of 0.91 mg/dL). Liver Function Tests: Recent Labs  Lab 11/16/22 1424  AST 14*  ALT 10  ALKPHOS 56  BILITOT 0.7  PROT 7.0  ALBUMIN 3.5   Recent Labs   Lab 11/16/22 1424  LIPASE 22   No results for input(s): "AMMONIA" in the last 168 hours. Coagulation Profile: No results for input(s): "INR", "PROTIME" in the last 168 hours. Cardiac Enzymes: No results for input(s): "CKTOTAL", "CKMB", "CKMBINDEX", "TROPONINI" in the last 168 hours. BNP (last 3 results) No results for input(s): "PROBNP" in the last 8760 hours. HbA1C: No results for input(s): "HGBA1C" in the last 72 hours. CBG: No results for input(s): "GLUCAP" in the last 168 hours. Lipid Profile: No results for input(s): "CHOL", "HDL", "LDLCALC", "TRIG", "CHOLHDL", "LDLDIRECT" in the last 72 hours. Thyroid Function Tests: No results for input(s): "TSH", "T4TOTAL", "FREET4", "T3FREE", "THYROIDAB" in the last 72 hours. Anemia Panel: No results for input(s): "VITAMINB12", "FOLATE", "FERRITIN", "TIBC", "IRON", "RETICCTPCT" in the last 72 hours. Urine analysis:    Component Value Date/Time   COLORURINE YELLOW (A) 11/16/2022 1929   APPEARANCEUR CLOUDY (A) 11/16/2022 1929   APPEARANCEUR Cloudy (A) 09/23/2022 1007   LABSPEC 1.016 11/16/2022 1929   LABSPEC 1.010 01/09/2013 0647   PHURINE 5.0 11/16/2022 1929   GLUCOSEU NEGATIVE 11/16/2022 1929   GLUCOSEU Negative 01/09/2013 0647   HGBUR SMALL (A) 11/16/2022 1929   BILIRUBINUR NEGATIVE 11/16/2022 1929   BILIRUBINUR Negative 09/23/2022 1007   BILIRUBINUR Negative 01/09/2013 0647   KETONESUR NEGATIVE 11/16/2022 1929   PROTEINUR 30 (A) 11/16/2022 1929   UROBILINOGEN 0.2 04/09/2022 1452   NITRITE POSITIVE (A) 11/16/2022 1929   LEUKOCYTESUR LARGE (A) 11/16/2022 1929   LEUKOCYTESUR 2+ 01/09/2013 0647   Sepsis Labs: @LABRCNTIP (procalcitonin:4,lacticidven:4) ) Recent Results (from the past 240 hour(s))  SARS  Coronavirus 2 by RT PCR (hospital order, performed in [facility] hospital lab) *cepheid single result test* Anterior Nasal Swab     Status: None   Collection Time: 11/16/22  5:00 PM   Specimen: Anterior Nasal Swab  Result Value  Ref Range Status   SARS Coronavirus 2 by RT PCR NEGATIVE NEGATIVE Final    Comment: (NOTE) SARS-CoV-2 target nucleic acids are NOT DETECTED.  The SARS-CoV-2 RNA is generally detectable in upper and lower respiratory specimens during the acute phase of infection. The lowest concentration of SARS-CoV-2 viral copies this assay can detect is 250 copies / mL. A negative result does not preclude SARS-CoV-2 infection and should not be used as the sole basis for treatment or other patient management decisions.  A negative result may occur with improper specimen collection / handling, submission of specimen other than nasopharyngeal swab, presence of viral mutation(s) within the areas targeted by this assay, and inadequate number of viral copies (<250 copies / mL). A negative result must be combined with clinical observations, patient history, and epidemiological information.  Fact Sheet for Patients:   RoadLapTop.co.za  Fact Sheet for Healthcare Providers: http://kim-miller.com/  This test is not yet approved or  cleared by the [place] FDA and has been authorized for detection and/or diagnosis of SARS-CoV-2 by FDA under an Emergency Use Authorization (EUA).  This EUA will remain in effect (meaning this test can be used) for the duration of the COVID-19 declaration under Section 564(b)(1) of the Act, 21 U.S.C. section 360bbb-3(b)(1), unless the authorization is terminated or revoked sooner.  Performed at [facility], [address], [place], [place] [address]      Radiological Exams on Admission: CT ABDOMEN PELVIS W CONTRAST  Result Date: 11/16/2022 CLINICAL DATA:  89 year old with right lower quadrant pain. EXAM: CT ABDOMEN AND PELVIS WITH CONTRAST TECHNIQUE: Multidetector CT imaging of the abdomen and pelvis was performed using the standard protocol following bolus administration of intravenous contrast. RADIATION DOSE  REDUCTION: This exam was performed according to the departmental dose-optimization program which includes automated exposure control, adjustment of the mA and/or kV according to patient size and/or use of iterative reconstruction technique. CONTRAST:  OMNIPAQUE IOHEXOL 300 MG/ML  SOLN COMPARISON:  Pelvic CT 10/27/2022.  Abdominopelvic CT 02/10/2018 FINDINGS: Lower chest: Coronary artery and mitral annulus calcifications. Hiatal hernia. No acute basilar airspace disease or pleural effusion. Hepatobiliary: No focal liver abnormality is seen. No gallstones, gallbladder wall thickening, or biliary dilatation. Pancreas: No ductal dilatation or inflammation. Spleen: Normal in size without focal abnormality. Adrenals/Urinary Tract: No adrenal nodule. There is bilateral perinephric edema. Mild enhancement of the right renal pelvis and proximal ureter. Left-sided nephroureteral stent in place. There is mild dilatation of the left renal pelvis but no calyceal dilatation. No visualized urolithiasis. No convincing bladder wall thickening. No bladder stone. Stomach/Bowel: Moderate to large hiatal hernia, greater than 50% of the stomach is intrathoracic. There is no small bowel distension or evidence of obstruction. Normal appendix visualized. Small to moderate volume of colonic stool. Diverticulosis from the descending colon distally. No diverticulitis or acute colonic inflammation. Vascular/Lymphatic: Advanced aortic and branch atherosclerosis. No aortic aneurysm. The portal vein is patent. No enlarged lymph nodes in the abdomen or pelvis. Reproductive: Left adnexal cyst measures 7.1 x 5.1 cm. Minimally increased in size from 2020 exam when it measured 5.9 x 4.4 cm. There is a peripheral calcification. Hysterectomy. Other: No ascites.  No free air or focal fluid collection. Musculoskeletal: Scoliosis and degenerative change  in the spine. Remote L1 compression deformity post augmentation. No acute osseous findings.  IMPRESSION: 1. Mild enhancement of the right renal pelvis and proximal ureter, suspicious for urinary tract infection. Recommend correlation with urinalysis. 2. Left-sided nephroureteral stent in place. Mild chronic dilatation of the left renal pelvis. 3. Left adnexal cyst measuring 7.1 x 5.1 cm, minimally increased in size from 2020 exam when it measured 5.9 x 4.4 cm. Pelvic ultrasound can be performed for follow-up as clinically indicated given patient's advanced age. 4. Moderate to large hiatal hernia. 5. Colonic diverticulosis without diverticulitis. Aortic Atherosclerosis (ICD10-I70.0). Electronically Signed   By: Narda Rutherford M.D.   On: 11/16/2022 21:21   DG Chest Portable 1 View  Result Date: 11/16/2022 CLINICAL DATA:  cough, weakness, fever EXAM: PORTABLE CHEST 1 VIEW COMPARISON:  07/01/2022 FINDINGS: The heart size and mediastinal contours are within normal limits. Aortic atherosclerosis. No focal airspace consolidation, pleural effusion, or pneumothorax. The visualized skeletal structures are unremarkable. IMPRESSION: No active disease. Electronically Signed   By: Duanne Guess D.O.   On: 11/16/2022 19:21      Assessment/Plan Principal Problem:   Complicated UTI (urinary tract infection) Active Problems:   Acute metabolic encephalopathy   Hyponatremia   COPD (chronic obstructive pulmonary disease) (HCC)   Essential hypertension   H/O non-Hodgkin's lymphoma   Adnexal mass   Assessment and Plan:  Complicated UTI (urinary tract infection): pt has left-sided nephroureteral stent in place, now has positive urinalysis, therefore diagnosis would be complicated UTI.  Patient does not meet criteria for sepsis currently, but is at high risk of developing sepsis.  She has WBC 11.5, temperature 100.4 (not above 100.4), RR 18, transient tachycardia 108 --> 87.  Lactic acid is normal at 0.9.  Patient has history of positive urine culture for Carbapenem resistant Pseudomonas  -Admit to  MedSurg bed as inpatient -IV cefepime -Follow-up blood culture urine culture -IV fluid: 1 L LR and 1 L normal saline in ED -Follow-up procalcitonin level and lactic acid level  Acute metabolic encephalopathy: Likely due to UTI.  Not sure if patient fell in facility -Follow-up CT of head -Frequent neurocheck -Fall precaution  Hyponatremia: Sodium 129 -Fluid restriction -IV fluid as above -Sodium chloride tablet 1 g twice daily  COPD (chronic obstructive pulmonary disease) (HCC): Stable -Bronchodilators, as needed Mucinex  Essential hypertension: -Hold blood pressure medications since patient is at risk of developing sepsis and hypotension -IV hydralazine as needed  H/O non-Hodgkin's lymphoma: S/p of chemotherapy.  WBC 11.5 -Follow-up with heme-onc  Adnexal mass: Patient has a known left adnexal cystic mass, CT scan showed slightly increased size. -For of with PCP for outpatient workup       DVT ppx: SQ Lovenox  Code Status: DNR (pt has DNR form from facility)  Family Communication:   Yes, patient's daughter   at bed side.     Disposition Plan:  Anticipate discharge back to previous environment, ALF  Consults called:  none  Admission status and Level of care: Med-Surg:    as inpt      Dispo: The patient is from: ALF              Anticipated d/c is to: ALF              Anticipated d/c date is: 2 days              Patient currently is not medically stable to d/c.    Severity of Illness:  The appropriate patient  status for this patient is INPATIENT. Inpatient status is judged to be reasonable and necessary in order to provide the required intensity of service to ensure the patient's safety. The patient's presenting symptoms, physical exam findings, and initial radiographic and laboratory data in the context of their chronic comorbidities is felt to place them at high risk for further clinical deterioration. Furthermore, it is not anticipated that the patient will be  medically stable for discharge from the hospital within 2 midnights of admission.   * I certify that at the point of admission it is my clinical judgment that the patient will require inpatient hospital care spanning beyond 2 midnights from the point of admission due to high intensity of service, high risk for further deterioration and high frequency of surveillance required.*       Date of Service 11/16/2022    Lorretta Harp [facility]   If 7PM-7AM, please contact night-coverage [email or web address] 11/16/2022, 10:26 PM

## 2022-11-16 NOTE — ED Triage Notes (Signed)
Pt in via EMS from [facility]. EMS reports pt was baseline yesterday and this am daughter came and to was in the bed and had urinated in the floor and has been tired today and really sleepy which is not her normal.

## 2022-11-16 NOTE — ED Notes (Signed)
Pt becoming more altered. Restless. EDP made aware.

## 2022-11-16 NOTE — ED Notes (Signed)
Hospitalist at bedside 

## 2022-11-16 NOTE — ED Provider Notes (Signed)
[facility] Provider Note    Event Date/Time   First MD Initiated Contact with Patient 11/16/22 1610     (approximate)   History   Chief Complaint: Confusion  HPI  Monica Singh is a 89 y.o. female with a history of COPD, dementia, recurrent UTI, hypertension who is brought to the ED due to confusion today and urinary incontinence at home, low energy.  Daughter reports this is not normal patient is normally awake alert oriented, ambulatory with a walker.  She has had decreased oral intake over the last 24 hours as well.     Physical Exam   Triage Vital Signs: ED Triage Vitals  Encounter Vitals Group     BP 11/16/22 1421 136/78     Systolic BP Percentile --      Diastolic BP Percentile --      Pulse Rate 11/16/22 1421 (!) 108     Resp 11/16/22 1420 20     Temp 11/16/22 1420 (!) 100.4 F (38 C)     Temp src --      SpO2 11/16/22 1421 93 %     Weight 11/16/22 1421 143 lb 4.8 oz (65 kg)     Height 11/16/22 1421 5\' 3"  (1.6 m)     Head Circumference --      Peak Flow --      Pain Score --      Pain Loc --      Pain Education --      Exclude from Growth Chart --     Most recent vital signs: Vitals:   11/16/22 1920 11/16/22 2043  BP: 118/70   Pulse: 87   Resp: 18   Temp:  98 F (36.7 C)  SpO2: 96%     General: Awake, no distress.  CV:  Good peripheral perfusion.  Tachycardia heart rate 105 Resp:  Normal effort.  Clear to auscultation bilaterally Abd:  No distention.  Soft with right lower quadrant tenderness Other:  Dry oral mucosa   ED Results / Procedures / Treatments   Labs (all labs ordered are listed, but only abnormal results are displayed) Labs Reviewed  CBC - Abnormal; Notable for the following components:      Result Value   WBC 11.5 (*)    Hemoglobin 11.9 (*)    All other components within normal limits  BASIC METABOLIC PANEL - Abnormal; Notable for the following components:   Sodium 129 (*)    Chloride 96 (*)     Glucose, Bld 173 (*)    Calcium 8.5 (*)    GFR, Estimated 59 (*)    All other components within normal limits  URINALYSIS, ROUTINE W REFLEX MICROSCOPIC - Abnormal; Notable for the following components:   Color, Urine YELLOW (*)    APPearance CLOUDY (*)    Hgb urine dipstick SMALL (*)    Protein, ur 30 (*)    Nitrite POSITIVE (*)    Leukocytes,Ua LARGE (*)    Bacteria, UA MANY (*)    All other components within normal limits  HEPATIC FUNCTION PANEL - Abnormal; Notable for the following components:   AST 14 (*)    All other components within normal limits  SARS CORONAVIRUS 2 BY RT PCR  CULTURE, BLOOD (ROUTINE X 2)  CULTURE, BLOOD (ROUTINE X 2)  LACTIC ACID, PLASMA  LIPASE, BLOOD  URINALYSIS, W/ REFLEX TO CULTURE (INFECTION SUSPECTED)     EKG    RADIOLOGY Chest x-ray interpreted by  me, appears normal without airspace opacity.  Radiology report reviewed   PROCEDURES:  .Critical Care  Performed by: Sharman Cheek, MD Authorized by: Sharman Cheek, MD   Critical care provider statement:    Critical care time (minutes):  35   Critical care time was exclusive of:  Separately billable procedures and treating other patients   Critical care was necessary to treat or prevent imminent or life-threatening deterioration of the following conditions:  Sepsis   Critical care was time spent personally by me on the following activities:  Development of treatment plan with patient or surrogate, discussions with consultants, evaluation of patient's response to treatment, examination of patient, obtaining history from patient or surrogate, ordering and performing treatments and interventions, ordering and review of laboratory studies, ordering and review of radiographic studies, pulse oximetry, re-evaluation of patient's condition and review of old charts   Care discussed with: admitting provider      MEDICATIONS ORDERED IN ED: Medications  cefTRIAXone (ROCEPHIN) 2 g in sodium  chloride 0.9 % 100 mL IVPB (0 g Intravenous Stopped 11/16/22 1736)  albuterol (PROVENTIL) (2.5 MG/3ML) 0.083% nebulizer solution 2.5 mg (has no administration in time range)  dextromethorphan-guaiFENesin (MUCINEX DM) 30-600 MG per 12 hr tablet 1 tablet (has no administration in time range)  ondansetron (ZOFRAN) injection 4 mg (has no administration in time range)  hydrALAZINE (APRESOLINE) injection 5 mg (has no administration in time range)  acetaminophen (TYLENOL) tablet 650 mg (has no administration in time range)  acetaminophen (TYLENOL) tablet 650 mg (650 mg Oral Given 11/16/22 1429)  sodium chloride 0.9 % bolus 1,000 mL (0 mLs Intravenous Stopped 11/16/22 2126)  iohexol (OMNIPAQUE) 300 MG/ML solution 100 mL (100 mLs Intravenous Contrast Given 11/16/22 1853)  lactated ringers bolus 1,000 mL (1,000 mLs Intravenous New Bag/Given 11/16/22 2131)  acetaminophen (TYLENOL) tablet 1,000 mg (1,000 mg Oral Given 11/16/22 2132)     IMPRESSION / MDM / ASSESSMENT AND PLAN / ED COURSE  I reviewed the triage vital signs and the nursing notes.  DDx: UTI, pneumonia, COVID, viral illness, dehydration, AKI, anemia, sepsis  Patient's presentation is most consistent with acute presentation with potential threat to life or bodily function.  Patient presents with confusion, generalized weakness.  No trauma.  Has a fever and tachycardia.  Lactate normal, blood cultures obtained.  Serum labs unremarkable, baseline mild hyponatremia.  COVID test negative.  Will obtain CT abdomen pelvis and urinalysis.    Clinical Course as of 11/16/22 2140  Sat Nov 16, 2022  2122 Hr 130 - sinus tach. Will give additional IVF bolus. No hypotension [PS]    Clinical Course User Index [PS] Sharman Cheek, MD    ----------------------------------------- 9:40 PM on 11/16/2022 ----------------------------------------- Case discussed with hospitalist   FINAL CLINICAL IMPRESSION(S) / ED DIAGNOSES   Final diagnoses:   Cystitis  Sepsis, due to unspecified organism, unspecified whether acute organ dysfunction present [facility])     Rx / DC Orders   ED Discharge Orders     None        Note:  This document was prepared using Dragon voice recognition software and may include unintentional dictation errors.   Sharman Cheek, MD 11/16/22 2140

## 2022-11-17 DIAGNOSIS — N390 Urinary tract infection, site not specified: Secondary | ICD-10-CM | POA: Diagnosis not present

## 2022-11-17 LAB — CBC
HCT: 30.8 % — ABNORMAL LOW (ref 36.0–46.0)
Hemoglobin: 10.4 g/dL — ABNORMAL LOW (ref 12.0–15.0)
MCH: 29.5 pg (ref 26.0–34.0)
MCHC: 33.8 g/dL (ref 30.0–36.0)
MCV: 87.3 fL (ref 80.0–100.0)
Platelets: 263 10*3/uL (ref 150–400)
RBC: 3.53 MIL/uL — ABNORMAL LOW (ref 3.87–5.11)
RDW: 13.3 % (ref 11.5–15.5)
WBC: 14.6 10*3/uL — ABNORMAL HIGH (ref 4.0–10.5)
nRBC: 0 % (ref 0.0–0.2)

## 2022-11-17 LAB — BASIC METABOLIC PANEL
Anion gap: 6 (ref 5–15)
BUN: 12 mg/dL (ref 8–23)
CO2: 24 mmol/L (ref 22–32)
Calcium: 7.8 mg/dL — ABNORMAL LOW (ref 8.9–10.3)
Chloride: 102 mmol/L (ref 98–111)
Creatinine, Ser: 0.83 mg/dL (ref 0.44–1.00)
GFR, Estimated: >60 mL/min (ref >60)
Glucose, Bld: 122 mg/dL — ABNORMAL HIGH (ref 70–99)
Potassium: 3.2 mmol/L — ABNORMAL LOW (ref 3.5–5.1)
Sodium: 132 mmol/L — ABNORMAL LOW (ref 135–145)

## 2022-11-17 MED ORDER — SODIUM CHLORIDE 0.9 % IV BOLUS
500.0000 mL | Freq: Once | INTRAVENOUS | Status: AC
  Administered 2022-11-17: 500 mL via INTRAVENOUS

## 2022-11-17 NOTE — ED Notes (Signed)
Dr. Dahal at bedside. 

## 2022-11-17 NOTE — ED Notes (Signed)
Family member coming out stating the pt vomited, seen in bedpan, clear fluids.

## 2022-11-17 NOTE — ED Notes (Signed)
Patient's family notified this RN she was leaving for the night as patient was resting and seemed more like herself. VSS, call light within reach.

## 2022-11-17 NOTE — ED Notes (Signed)
RN attempted neuro check but family refused due to pt resting at this time.

## 2022-11-17 NOTE — ED Notes (Signed)
Patient medicated and cleaned of urinary incontinence. New brief placed. VSS, CCM in use, call light within reach, bed alarm turned on due to no family at bedside.

## 2022-11-17 NOTE — Progress Notes (Signed)
PROGRESS NOTE  Monica Dakota [name]  DOB: 1928/12/29  PCP: Malva Limes, MD NFA:[id number]  DOA: 11/16/2022  LOS: 1 day  Hospital Day: 2  Brief narrative: Monica Singh is a 89 y.o. female with PMH significant for dementia, HTN, COPD, non-Hodgkin's lymphoma, adnexal mass, hypothyroidism,  left-sided ureteral stent in place, CKD, recurrent UTI requiring recurrent hospitalizations and now on chronic suppressive antibiotics. Patient lives in an assisted living facility. At baseline, she is pleasantly demented and is able to ambulate with a walker. 10/19, patient presented to the ED with complaint of altered mental status, weakness and fever. Per report, patient was in her usual state of health the previous day.  On the day of presentation, patient woke up with confusion, she had urinated on the bed and was tired and sleepy for several hours of the day.  In the ED, patient was confused, not oriented to place or person but she was agitated as well. She had a fever of 100.4 with heart rate 108 and blood pressure 130s. Initial labs with WC count 11.5, sodium level low at 129, lactic acid not elevated Chest x-ray unremarkable CT head did not show any acute abnormality CT abdomen pelvis showed 1. Mild enhancement of the right renal pelvis and proximal ureter, suspicious for urinary tract infection.  2. Left-sided nephroureteral stent in place. Mild chronic dilatation of the left renal pelvis. 3. Left adnexal cyst measuring 7.1 x 5.1 cm, minimally increased in size from 2020 exam when it measured 5.9 x 4.4 cm.  4. Moderate to large hiatal hernia. 5. Colonic diverticulosis without diverticulitis.  Urine culture and blood culture sent Patient was started on IV antibiotics Admitted to [facility]  Subjective: Patient was seen and examined this morning.  Pleasant elderly cheerful demented.  Propped up in bed.  Not in distress.  Daughter and son at bedside. Patient has more than 10 courses of  antibiotics for UTI this year alone.  She was last hospitalized in March for the same. Follows up with [facility] urology. Per daughter at bedside, mental status much better this morning. Chart reviewed. No recurrence of fever overnight, heart rate improving to 80s this morning, blood pressure down to 90s Repeat labs this morning with WC count at 14.6, hemoglobin 10.4, sodium 132, potassium 3.2  Assessment and plan: Sepsis POA complicated UTI (urinary tract infection) Right pyelonephritis Left ureteral stent in situ H/o Carbapenem resistant Pseudomonas Presented with lethargy, nausea, vomiting  Met sepsis criteria with fever, tachycardia, leukocytosis and imaging suggestive of right pyonephritis  Urinalysis positive.  Urine culture and blood culture sent Given history of Carbapenem resistant Pseudomonas, patient has been started on IV cefepime It seems patient is chronically on UTI prophylaxis with methenamine 1 g twice daily with probiotics Continue to monitor Recent Labs  Lab 11/16/22 1424 11/16/22 1656 11/16/22 1700 11/17/22 0440  WBC 11.5*  --   --  14.6*  LATICACIDVEN  --   --  0.9  --   PROCALCITON  --  0.21  --   --    Acute metabolic encephalopathy Underlying dementia Likely due to UTI.   CT is unremarkable Mental status much better per daughter Fall precautions.  Neurochecks   Mild chronic hyponatremia Improved on gentle hydration.  Encourage oral intake. Salt tablet 1 g twice daily was started on admission. Recent Labs  Lab 11/16/22 1424 11/17/22 0440  NA 129* 132*   Mild chronic anemia Hemoglobin stable.  Continue vitamin B12 and folic acid supplement. Recent Labs  04/12/22 0272 04/13/22 0422 04/14/22 0320 11/16/22 1424 11/17/22 0440  HGB 10.9* 10.2* 10.3* 11.9* 10.4*  MCV 83.3 84.6 84.0 88.3 87.3   COPD Stable respiratory status Continue bronchodilators, as needed Mucinex   Essential hypertension: PTA meds- amlodipine 5 mg daily With  sepsis, blood pressure medicines were held. Current blood pressure in 90s and 100s.  Continue to monitor. Continue IV hydralazine as needed   History non-Hodgkin's lymphoma S/p of chemotherapy.  WBC 11.5 Follow-up with heme-onc   Adnexal mass Patient has a known left adnexal cystic mass, CT scan showed slightly increased size since 2020. Follow-up with PCP for outpatient workup     Mobility: Encourage ambulation.  PT eval ordered  Goals of care   Code Status: Limited: Do not attempt resuscitation (DNR) -DNR-LIMITED -Do Not Intubate/DNI      DVT prophylaxis:  enoxaparin (LOVENOX) injection 40 mg Start: 11/16/22 2200   Antimicrobials: IV cefepime Fluid: None Consultants: None Family Communication: Daughter at bedside  Status: Inpatient Level of care:  Med-Surg   Patient is from: ALF Needs to continue in-hospital care: Pending urine culture report Anticipated d/c to: Back to ALF in 1 to 2 days      Diet:  Diet Order             DIET DYS 2 Fluid consistency: Thin; Fluid restriction: 1200 mL Fluid  Diet effective now                   Scheduled Meds:  enoxaparin (LOVENOX) injection  40 mg Subcutaneous Q24H   mometasone-formoterol  2 puff Inhalation BID   sodium chloride  1 g Oral BID WC   tiotropium  18 mcg Inhalation Daily    PRN meds: acetaminophen, albuterol, dextromethorphan-guaiFENesin, hydrALAZINE, ondansetron (ZOFRAN) IV   Infusions:   ceFEPime (MAXIPIME) IV 2 g (11/17/22 1140)    Antimicrobials: Anti-infectives (From admission, onward)    Start     Dose/Rate Route Frequency Ordered Stop   11/17/22 0000  ceFEPIme (MAXIPIME) 2 g in sodium chloride 0.9 % 100 mL IVPB        2 g 200 mL/hr over 30 Minutes Intravenous Every 12 hours 11/16/22 2148     11/16/22 1630  cefTRIAXone (ROCEPHIN) 2 g in sodium chloride 0.9 % 100 mL IVPB  Status:  Discontinued        2 g 200 mL/hr over 30 Minutes Intravenous Every 24 hours 11/16/22 1620 11/16/22 2145        Objective: Vitals:   11/17/22 1030 11/17/22 1130  BP: (!) 117/55 (!) 95/47  Pulse: 87 91  Resp: 18 (!) 23  Temp:    SpO2: 100% 95%    Intake/Output Summary (Last 24 hours) at 11/17/2022 1144 Last data filed at 11/17/2022 0321 Gross per 24 hour  Intake 1600 ml  Output --  Net 1600 ml   Filed Weights   11/16/22 1421  Weight: 65 kg   Weight change:  Body mass index is 25.38 kg/m.   Physical Exam: General exam: Pleasant, elderly.  Not in pain Skin: No rashes, lesions or ulcers. HEENT: Atraumatic, normocephalic, no obvious bleeding Lungs: Clear to auscultation bilaterally CVS: Regular rate and rhythm, no murmur GI/Abd soft, nontender, nondistended, bowel sound present.  Mild right flank tenderness present CNS: Alert, awake, cheerful but disoriented Psychiatry: Mood appropriate Extremities: No pedal edema, no calf tenderness  Data Review: I have personally reviewed the laboratory data and studies available.  F/u labs ordered Unresulted Labs (From admission, onward)  Start     Ordered   11/18/22 0500  CBC with Differential/Platelet  Daily,   R      11/17/22 1144   11/18/22 0500  Basic metabolic panel  Daily,   R      11/17/22 1144   11/16/22 1929  Urine Culture  Once,   R        11/16/22 1929           Total time spent in review of labs and imaging, patient evaluation, formulation of plan, documentation and communication with family: 55 minutes  Signed, Lorin Glass, MD [facility] 11/17/2022

## 2022-11-17 NOTE — ED Notes (Signed)
Patient medicated and offered to go to the restroom but declined at this time. VSS, family at bedside. No other needs identified at this time.

## 2022-11-18 ENCOUNTER — Encounter: Payer: Self-pay | Admitting: Internal Medicine

## 2022-11-18 ENCOUNTER — Other Ambulatory Visit: Payer: Self-pay

## 2022-11-18 DIAGNOSIS — N390 Urinary tract infection, site not specified: Secondary | ICD-10-CM | POA: Diagnosis not present

## 2022-11-18 LAB — BASIC METABOLIC PANEL
Anion gap: 11 (ref 5–15)
BUN: 16 mg/dL (ref 8–23)
CO2: 23 mmol/L (ref 22–32)
Calcium: 8.2 mg/dL — ABNORMAL LOW (ref 8.9–10.3)
Chloride: 99 mmol/L (ref 98–111)
Creatinine, Ser: 0.91 mg/dL (ref 0.44–1.00)
GFR, Estimated: 59 mL/min — ABNORMAL LOW (ref >60)
Glucose, Bld: 99 mg/dL (ref 70–99)
Potassium: 3.6 mmol/L (ref 3.5–5.1)
Sodium: 133 mmol/L — ABNORMAL LOW (ref 135–145)

## 2022-11-18 LAB — CBC WITH DIFFERENTIAL/PLATELET
Abs Immature Granulocytes: 0.04 10*3/uL (ref 0.00–0.07)
Basophils Absolute: 0.1 10*3/uL (ref 0.0–0.1)
Basophils Relative: 1 %
Eosinophils Absolute: 0.2 10*3/uL (ref 0.0–0.5)
Eosinophils Relative: 2 %
HCT: 31.7 % — ABNORMAL LOW (ref 36.0–46.0)
Hemoglobin: 10.6 g/dL — ABNORMAL LOW (ref 12.0–15.0)
Immature Granulocytes: 1 %
Lymphocytes Relative: 19 %
Lymphs Abs: 1.6 10*3/uL (ref 0.7–4.0)
MCH: 29.3 pg (ref 26.0–34.0)
MCHC: 33.4 g/dL (ref 30.0–36.0)
MCV: 87.6 fL (ref 80.0–100.0)
Monocytes Absolute: 0.8 10*3/uL (ref 0.1–1.0)
Monocytes Relative: 9 %
Neutro Abs: 5.7 10*3/uL (ref 1.7–7.7)
Neutrophils Relative %: 68 %
Platelets: 262 10*3/uL (ref 150–400)
RBC: 3.62 MIL/uL — ABNORMAL LOW (ref 3.87–5.11)
RDW: 13.3 % (ref 11.5–15.5)
WBC: 8.3 10*3/uL (ref 4.0–10.5)
nRBC: 0 % (ref 0.0–0.2)

## 2022-11-18 LAB — URINE CULTURE

## 2022-11-18 MED ORDER — CEPHALEXIN 500 MG PO CAPS
500.0000 mg | ORAL_CAPSULE | Freq: Three times a day (TID) | ORAL | Status: DC
  Administered 2022-11-18 – 2022-11-24 (×19): 500 mg via ORAL
  Filled 2022-11-18 (×19): qty 1

## 2022-11-18 MED ORDER — SENNOSIDES-DOCUSATE SODIUM 8.6-50 MG PO TABS
1.0000 | ORAL_TABLET | Freq: Every day | ORAL | Status: DC
  Administered 2022-11-18 – 2022-11-26 (×9): 1 via ORAL
  Filled 2022-11-18 (×9): qty 1

## 2022-11-18 MED ORDER — POLYETHYLENE GLYCOL 3350 17 G PO PACK: 17.0000 g | PACK | Freq: Every day | ORAL | Status: DC | PRN

## 2022-11-18 MED ORDER — SACCHAROMYCES BOULARDII 250 MG PO CAPS
250.0000 mg | ORAL_CAPSULE | Freq: Two times a day (BID) | ORAL | Status: DC
  Administered 2022-11-18 – 2022-11-27 (×19): 250 mg via ORAL
  Filled 2022-11-18 (×19): qty 1

## 2022-11-18 NOTE — ED Notes (Signed)
Patient ambulatory to restroom with assist x1 and back to bed. New brief applied. New chucks pads placed into bed. VSS, CCM in use, call light within reach, bed alarm on.

## 2022-11-18 NOTE — Evaluation (Signed)
Physical Therapy Evaluation Patient Details Name: Monica Singh MRN: [id number] DOB: [birth year] Today's Date: 11/18/2022  History of Present Illness  Pt is a 89 y.o. female presenting to hospital 11/16/22 with confusion and urinary incontinence and fever.  Pt admitted with complicated UTI, acute metabolic encephalopathy, hyponatremia.  PMH includes recurrent UTI, L sided nephroureteral stent, htn, COPD, dementia, non-Hodgkin lymphoma, adnexal mass, COPD, CKD, back sx, R TKA, L TKA.  Clinical Impression  Prior to recent medical concerns, pt was modified independent ambulating with RW; lives at [facility] (Independent Living); has assist with meal preparation, medications, and bathing.  No c/o pain during session.  Pt oriented to person and general situation only (typically would know place).  Currently pt is min assist with bed mobility; CGA with transfers; and CGA to ambulate short distances with RW.  Generalized weakness and decreased activity tolerance from baseline noted.  Pt would currently benefit from skilled PT to address noted impairments and functional limitations (see below for any additional details).  Upon hospital discharge, pt would benefit from ongoing therapy.     If plan is discharge home, recommend the following: A little help with walking and/or transfers;A little help with bathing/dressing/bathroom;Assistance with cooking/housework;Direct supervision/assist for medications management;Direct supervision/assist for financial management;Assist for transportation;Help with stairs or ramp for entrance   Can travel by private vehicle        Equipment Recommendations Other (comment) (pt has RW at home already)  Recommendations for Other Services       Functional Status Assessment Patient has had a recent decline in their functional status and demonstrates the ability to make significant improvements in function in a reasonable and predictable amount of time.      Precautions / Restrictions Precautions Precautions: Fall Restrictions Weight Bearing Restrictions: No      Mobility  Bed Mobility Overal bed mobility: Needs Assistance Bed Mobility: Supine to Sit, Sit to Supine     Supine to sit: Min assist (assist for trunk) Sit to supine: Min assist (assist for B LE's)   General bed mobility comments: increased effort/time to perform; vc's for technique    Transfers Overall transfer level: Needs assistance Equipment used: Rolling walker (2 wheels) Transfers: Sit to/from Stand Sit to Stand: Contact guard assist           General transfer comment: x1 trial standing from bed and x1 trial standing from toilet (with use of grab bar in bathroom); vc's for UE placement    Ambulation/Gait Ambulation/Gait assistance: Contact guard assist Gait Distance (Feet):  (20 feet; 40 feet) Assistive device: Rolling walker (2 wheels) Gait Pattern/deviations: Step-through pattern, Decreased step length - right, Decreased step length - left Gait velocity: decreased     General Gait Details: increased time to take steps; vc's for upright posture and staying closer to RW (pt's daughter reports pt generally has flexed posture during ambulation)  Stairs            Wheelchair Mobility     Tilt Bed    Modified Rankin (Stroke Patients Only)       Balance Overall balance assessment: Needs assistance Sitting-balance support: No upper extremity supported, Feet supported Sitting balance-Leahy Scale: Good Sitting balance - Comments: steady reaching within BOS   Standing balance support: Bilateral upper extremity supported, During functional activity, Reliant on assistive device for balance Standing balance-Leahy Scale: Fair Standing balance comment: no loss of balance noted during ambulation (although slower gait speed noted)  Pertinent Vitals/Pain Pain Assessment Pain Assessment: No/denies pain Vitals  (HR and SpO2 on room air) stable and WFL throughout treatment session.    Home Living Family/patient expects to be discharged to:: Assisted living                   Additional Comments: [facility] (Independent [facility])    Prior Function               Mobility Comments: Pt ambulatory with RW.  H/o 1 fall about 3 weeks ago putting on pants. ADLs Comments: Pt has assist with meal preparation, medications, and bathing.     Extremity/Trunk Assessment   Upper Extremity Assessment Upper Extremity Assessment: Defer to OT evaluation    Lower Extremity Assessment Lower Extremity Assessment: Generalized weakness       Communication   Communication Communication: Hearing impairment (HOH) Cueing Techniques: Verbal cues;Visual cues  Cognition Arousal: Alert Behavior During Therapy: WFL for tasks assessed/performed                                   General Comments: Oriented to person and general situation but not time or place (pt typically not oriented to time only)        General Comments  Nursing cleared pt for participation in physical therapy.  Pt agreeable to PT session.  Pt's daughter present during session.    Exercises     Assessment/Plan    PT Assessment Patient needs continued PT services  PT Problem List Decreased strength;Decreased activity tolerance;Decreased balance;Decreased mobility;Decreased cognition;Decreased knowledge of use of DME;Decreased safety awareness;Decreased knowledge of precautions       PT Treatment Interventions DME instruction;Gait training;Functional mobility training;Therapeutic activities;Therapeutic exercise;Balance training;Cognitive remediation;Patient/family education    PT Goals (Current goals can be found in the Care Plan section)  Acute Rehab PT Goals Patient Stated Goal: to improve mobility and strength PT Goal Formulation: With patient/family Time For Goal Achievement: 12/02/22 Potential to Achieve  Goals: Good    Frequency Min 1X/week     Co-evaluation               AM-PAC PT "6 Clicks" Mobility  Outcome Measure Help needed turning from your back to your side while in a flat bed without using bedrails?: A Little Help needed moving from lying on your back to sitting on the side of a flat bed without using bedrails?: A Little Help needed moving to and from a bed to a chair (including a wheelchair)?: A Little Help needed standing up from a chair using your arms (e.g., wheelchair or bedside chair)?: A Little Help needed to walk in hospital room?: A Little Help needed climbing 3-5 steps with a railing? : A Little 6 Click Score: 18    End of Session Equipment Utilized During Treatment: Gait belt Activity Tolerance: Patient limited by fatigue Patient left: in bed;with call bell/phone within reach;with bed alarm set;with family/visitor present;with nursing/sitter in room Nurse Communication: Mobility status;Precautions PT Visit Diagnosis: Other abnormalities of gait and mobility (R26.89);Muscle weakness (generalized) (M62.81)    Time: 1610-9604 PT Time Calculation (min) (ACUTE ONLY): 36 min   Charges:   PT Evaluation $PT Eval Low Complexity: 1 Low PT Treatments $Therapeutic Activity: 8-22 mins PT General Charges $$ ACUTE PT VISIT: 1 Visit        Hendricks Limes, PT 11/18/22, 3:49 PM

## 2022-11-18 NOTE — Evaluation (Signed)
Occupational Therapy Evaluation Patient Details Name: Monica Singh MRN: [id number] DOB: Sep 22, 1928 Today's Date: 11/18/2022   History of Present Illness Pt is a 89 y.o. female presenting to hospital 11/16/22 with confusion and urinary incontinence and fever.  Pt admitted with complicated UTI, acute metabolic encephalopathy, hyponatremia.  PMH includes recurrent UTI, L sided nephroureteral stent, htn, COPD, dementia, non-Hodgkin lymphoma, adnexal mass, COPD, CKD, back sx, bil TKA.   Clinical Impression   Pt was seen for OT evaluation this date. Prior to hospital admission, pt was living at ALF where family was paying more for pt to have assistance with bathing and getting OOB. However, daughter reports due to short staffing they had not been doing it like they should. Pt was MOD I with mobility using RW to get to dining room, bathroom, etc. She needed occasional assist with dressing.  Pt presents to acute OT demonstrating impaired ADL performance and functional mobility 2/2 weakness and confusion with mild balance deficits (See OT problem list for additional functional deficits). Pt currently requires SBA for bed mobility using bed rails and HOB elevated with cues for hand placement. She required CGA for STS from EOB and mobility in the room and hallways using RW. Pt demo toilet transfer with CGA needing BSC placed over toilet for increased height/ease. She performed anterior hygiene via lateral leans and LB dressing seated at EOB to don mesh panties with CGA. Pt stood at sink to perform hand and oral hygiene with CGA. Per family she is ambulating slower pace than her baseline. Cueing to stay in frame of walker and remain upright throughout mobility. She denied pain.  Pt would benefit from skilled OT services to address noted impairments and functional limitations (see below for any additional details) in order to maximize safety and independence while minimizing falls risk and caregiver burden. Do  anticipate the need for follow up OT services upon acute hospital DC with recommendation for SNF at this time for increased assistance than cannot be provided at ALF. If pt continues to improve, she may progress enough to return to ALF with [facility] services.       If plan is discharge home, recommend the following: A little help with walking and/or transfers;A little help with bathing/dressing/bathroom;Assistance with cooking/housework;Help with stairs or ramp for entrance;Supervision due to cognitive status    Functional Status Assessment  Patient has had a recent decline in their functional status and demonstrates the ability to make significant improvements in function in a reasonable and predictable amount of time.  Equipment Recommendations  Other (comment) (defer to next venue)    Recommendations for Other Services       Precautions / Restrictions Precautions Precautions: Fall Restrictions Weight Bearing Restrictions: No      Mobility Bed Mobility Overal bed mobility: Needs Assistance Bed Mobility: Supine to Sit     Supine to sit: Supervision, Used rails, HOB elevated     General bed mobility comments: SUP for bed mobility with HOB elevated and use of bedrails with extra time    Transfers Overall transfer level: Needs assistance Equipment used: Rolling walker (2 wheels) Transfers: Sit to/from Stand Sit to Stand: Contact guard assist           General transfer comment: CGA for STS from EOB x2 and toilet x1      Balance Overall balance assessment: Needs assistance Sitting-balance support: No upper extremity supported, Feet supported Sitting balance-Leahy Scale: Good Sitting balance - Comments: able to don mesh panties at bedside with  SBA   Standing balance support: Bilateral upper extremity supported, During functional activity, Reliant on assistive device for balance Standing balance-Leahy Scale: Fair Standing balance comment: no loss of balance noted during  ambulation (although slower gait speed noted per daughter)                           ADL either performed or assessed with clinical judgement   ADL Overall ADL's : Needs assistance/impaired     Grooming: Wash/dry hands;Oral care;Standing;Contact guard assist Grooming Details (indicate cue type and reason): CGA for balance in standing, but otherwise set up assist and verb cues provided             Lower Body Dressing: Contact guard assist;Sit to/from stand Lower Body Dressing Details (indicate cue type and reason): CGA to don mesh panties in standing for balance and ensure they were in the proper position Toilet Transfer: Contact guard assist;Comfort height toilet;Grab bars Toilet Transfer Details (indicate cue type and reason): BSC over toilet to increase height Toileting- Clothing Manipulation and Hygiene: Contact guard assist;Sitting/lateral lean;Sit to/from stand       Functional mobility during ADLs: Contact guard assist;Rolling walker (2 wheels)       Vision         Perception         Praxis         Pertinent Vitals/Pain Pain Assessment Pain Assessment: No/denies pain     Extremity/Trunk Assessment Upper Extremity Assessment Upper Extremity Assessment: Overall WFL for tasks assessed   Lower Extremity Assessment Lower Extremity Assessment: Generalized weakness       Communication Communication Communication: Hearing impairment (HOH) Cueing Techniques: Verbal cues;Visual cues   Cognition Arousal: Alert Behavior During Therapy: WFL for tasks assessed/performed                                   General Comments: Oriented to person and general situation but not time or place (pt typically not oriented to time only)     General Comments       Exercises     Shoulder Instructions      Home Living Family/patient expects to be discharged to:: Assisted living                             Home Equipment: Rolling  Walker (2 wheels)   Additional Comments: Home Place (Independent Living)      Prior Functioning/Environment Prior Level of Function : Independent/Modified Independent             Mobility Comments: Pt ambulatory with RW.  H/o 1 fall about 3 weeks ago putting on pants. ADLs Comments: Pt has assist with meal preparation, medications, and bathing. Reports occasional assist with dressing tasks.        OT Problem List: Decreased strength;Impaired balance (sitting and/or standing);Decreased cognition      OT Treatment/Interventions: Self-care/ADL training;Therapeutic exercise;Therapeutic activities;Balance training;Patient/family education    OT Goals(Current goals can be found in the care plan section) Acute Rehab OT Goals Patient Stated Goal: improve strength OT Goal Formulation: With patient/family Time For Goal Achievement: 12/02/22 Potential to Achieve Goals: Good ADL Goals Pt Will Perform Lower Body Bathing: with contact guard assist;sitting/lateral leans;sit to/from stand Pt Will Perform Lower Body Dressing: with supervision;sitting/lateral leans;sit to/from stand Pt Will Transfer to Toilet: with supervision;grab bars;ambulating;regular height  toilet Pt Will Perform Toileting - Clothing Manipulation and hygiene: with supervision;sit to/from stand;sitting/lateral leans  OT Frequency: Min 1X/week    Co-evaluation              AM-PAC OT "6 Clicks" Daily Activity     Outcome Measure Help from another person eating meals?: None Help from another person taking care of personal grooming?: None Help from another person toileting, which includes using toliet, bedpan, or urinal?: A Little Help from another person bathing (including washing, rinsing, drying)?: A Little Help from another person to put on and taking off regular upper body clothing?: A Little Help from another person to put on and taking off regular lower body clothing?: A Little 6 Click Score: 20   End of  Session Equipment Utilized During Treatment: Rolling walker (2 wheels);Gait belt Nurse Communication: Mobility status  Activity Tolerance: Patient tolerated treatment well Patient left: in bed;with call bell/phone within reach;with family/visitor present  OT Visit Diagnosis: History of falling (Z91.81);Muscle weakness (generalized) (M62.81);Other abnormalities of gait and mobility (R26.89)                Time: 9528-4132 OT Time Calculation (min): 38 min Charges:  OT General Charges $OT Visit: 1 Visit OT Evaluation $OT Eval Low Complexity: 1 Low OT Treatments $Self Care/Home Management : 23-37 mins $Therapeutic Activity: 23-37 mins Loudon Krakow, OTR/L 11/18/22, 4:24 PM  Solymar Grace E Verline Kong 11/18/2022, 4:19 PM

## 2022-11-18 NOTE — ED Notes (Signed)
PT at bedside.

## 2022-11-18 NOTE — Progress Notes (Signed)
PROGRESS NOTE  [place] Plot[place]  DOB: 19-Aug-1928  PCP: Malva Limes, MD UJW:[id number]  DOA: 11/16/2022  LOS: 2 days  Hospital Day: 3  Brief narrative: Monica Singh is a 89 y.o. female with PMH significant for dementia, HTN, COPD, non-Hodgkin's lymphoma, adnexal mass, hypothyroidism,  left-sided ureteral stent in place, CKD, recurrent UTI requiring recurrent hospitalizations and now on chronic suppressive antibiotics. Patient lives in an assisted living facility. At baseline, she is pleasantly demented and is able to ambulate with a walker. 10/19, patient presented to the ED with complaint of altered mental status, weakness and fever. Per report, patient was in her usual state of health the previous day.  On the day of presentation, patient woke up with confusion, she had urinated on the bed and was tired and sleepy for several hours of the day.  In the ED, patient was confused, not oriented to place or person but she was agitated as well. She had a fever of 100.4 with heart rate 108 and blood pressure 130s. Initial labs with WC count 11.5, sodium level low at 129, lactic acid not elevated UA showed cloudy yellow urine with small amount of hemoglobin, large leukocytes, positive nitrate, many bacteria Chest x-ray unremarkable CT head did not show any acute abnormality CT abdomen pelvis showed 1. Mild enhancement of the right renal pelvis and proximal ureter, suspicious for urinary tract infection.  2. Left-sided nephroureteral stent in place. Mild chronic dilatation of the left renal pelvis. 3. Left adnexal cyst measuring 7.1 x 5.1 cm, minimally increased in size from 2020 exam when it measured 5.9 x 4.4 cm.  4. Moderate to large hiatal hernia. 5. Colonic diverticulosis without diverticulitis.  Urine culture and blood culture sent Patient was started on IV antibiotics Admitted to [name]  Subjective: Patient was seen and examined this morning.  This morning.  Pleasant  elderly Caucasian female.  Propped up in bed.  Not in distress.  Cheerful.  Alert, awake, not in distress.  Daughters at bedside. Hemodynamically stable. Lab this morning with WBC count improving to 8.3,, sodium low at 133  Assessment and plan: Sepsis POA complicated UTI (urinary tract infection) Right pyelonephritis Left ureteral stent in situ H/o Carbapenem resistant Pseudomonas Presented with lethargy, nausea, vomiting  Met sepsis criteria with fever, tachycardia, leukocytosis and imaging suggestive of right pyonephritis  Urinalysis positive.  Urine culture is growing multiple species.   Given history of Carbapenem resistant Pseudomonas, patient was started on IV cefepime on admission.  Since there is no specific growth in urine culture, I would start IV cefepime and start the patient on oral Keflex.  Per daughter, she had responded to oral Keflex in the past. It seems patient is chronically on UTI prophylaxis with methenamine 1 g twice daily with probiotics.  Currently methenamine on hold.  Continue probiotics. Continue to monitor Recent Labs  Lab 11/16/22 1424 11/16/22 1656 11/16/22 1700 11/17/22 0440 11/18/22 0536  WBC 11.5*  --   --  14.6* 8.3  LATICACIDVEN  --   --  0.9  --   --   PROCALCITON  --  0.21  --   --   --    Acute metabolic encephalopathy Underlying dementia Patient was altered on admission likely due to UTI.   CT is unremarkable.  Mental status much improved.  Confusion disoriented but this is her baseline.   Continue for precautions. Pending PT eval.   Mild chronic hyponatremia Improved on gentle hydration.  Encourage oral intake. Salt tablet  1 g twice daily was started on admission. Recent Labs  Lab 11/16/22 1424 11/17/22 0440 11/18/22 0536  NA 129* 132* 133*   Mild chronic anemia Hemoglobin stable.  Continue vitamin B12 and folic acid supplement. Recent Labs    04/13/22 0422 04/14/22 0320 11/16/22 1424 11/17/22 0440 11/18/22 0536  HGB 10.2*  10.3* 11.9* 10.4* 10.6*  MCV 84.6 84.0 88.3 87.3 87.6   COPD Stable respiratory status Continue bronchodilators, as needed Mucinex   Essential hypertension: PTA meds- amlodipine 5 mg daily With sepsis, blood pressure medicines remain on hold.  Blood pressure remains controlled. Continue IV hydralazine as needed   History non-Hodgkin's lymphoma S/p of chemotherapy.  WBC 11.5 Follow-up with heme-onc.   Adnexal mass Patient has a known left adnexal cystic mass, CT scan showed slightly increased size since 2020. Follow-up with PCP for outpatient workup     Mobility: Encourage ambulation.  PT eval pending  Goals of care   Code Status: Limited: Do not attempt resuscitation (DNR) -DNR-LIMITED -Do Not Intubate/DNI      DVT prophylaxis:  enoxaparin (LOVENOX) injection 40 mg Start: 11/16/22 2200   Antimicrobials: Switch to oral Keflex today Fluid: None Consultants: None Family Communication: Daughters at bedside  Status: Inpatient Level of care:  Med-Surg   Patient is from: ALF Needs to continue in-hospital care: Pending PT eval Anticipated d/c to: ALF versus rehab      Diet:  Diet Order             DIET DYS 2 Fluid consistency: Thin; Fluid restriction: 1200 mL Fluid  Diet effective now                   Scheduled Meds:  enoxaparin (LOVENOX) injection  40 mg Subcutaneous Q24H   mometasone-formoterol  2 puff Inhalation BID   sodium chloride  1 g Oral BID WC   tiotropium  18 mcg Inhalation Daily    PRN meds: acetaminophen, albuterol, dextromethorphan-guaiFENesin, hydrALAZINE, ondansetron (ZOFRAN) IV   Infusions:   ceFEPime (MAXIPIME) IV Stopped (11/18/22 0003)    Antimicrobials: Anti-infectives (From admission, onward)    Start     Dose/Rate Route Frequency Ordered Stop   11/17/22 0000  ceFEPIme (MAXIPIME) 2 g in sodium chloride 0.9 % 100 mL IVPB        2 g 200 mL/hr over 30 Minutes Intravenous Every 12 hours 11/16/22 2148     11/16/22 1630   cefTRIAXone (ROCEPHIN) 2 g in sodium chloride 0.9 % 100 mL IVPB  Status:  Discontinued        2 g 200 mL/hr over 30 Minutes Intravenous Every 24 hours 11/16/22 1620 11/16/22 2145       Objective: Vitals:   11/18/22 0536 11/18/22 0600  BP:  121/60  Pulse:  91  Resp:  18  Temp: 99 F (37.2 C)   SpO2:  90%   No intake or output data in the 24 hours ending 11/18/22 0826  Filed Weights   11/16/22 1421  Weight: 65 kg   Weight change:  Body mass index is 25.38 kg/m.   Physical Exam: General exam: Pleasant, elderly.  Not in pain Skin: No rashes, lesions or ulcers. HEENT: Atraumatic, normocephalic, no obvious bleeding Lungs: Clear to auscultation bilaterally CVS: Regular rate and rhythm, no murmur GI/Abd soft, nontender, nondistended, bowel sound present.  Right flank tenderness improving. CNS: Alert, awake, cheerful but disoriented Psychiatry: Mood appropriate Extremities: No pedal edema, no calf tenderness  Data Review: I have personally reviewed the  laboratory data and studies available.  F/u labs ordered Unresulted Labs (From admission, onward)     Start     Ordered   11/18/22 0500  CBC with Differential/Platelet  Daily,   R      11/17/22 1144   11/18/22 0500  Basic metabolic panel  Daily,   R      11/17/22 1144   11/16/22 1929  Urine Culture  Once,   R        11/16/22 1929           Total time spent in review of labs and imaging, patient evaluation, formulation of plan, documentation and communication with family: 45 minutes  Signed, Lorin Glass, MD [facility] 11/18/2022

## 2022-11-18 NOTE — ED Notes (Signed)
Labs drawn. Patient offered toileting at this time but declined. Back to resting quietly with eyes closed, VSS, CCM in use, call light within reach, bed alarm on.

## 2022-11-19 DIAGNOSIS — N390 Urinary tract infection, site not specified: Secondary | ICD-10-CM | POA: Diagnosis not present

## 2022-11-19 LAB — CBC WITH DIFFERENTIAL/PLATELET
Abs Immature Granulocytes: 0.03 10*3/uL (ref 0.00–0.07)
Basophils Absolute: 0.1 10*3/uL (ref 0.0–0.1)
Basophils Relative: 1 %
Eosinophils Absolute: 0.3 10*3/uL (ref 0.0–0.5)
Eosinophils Relative: 5 %
HCT: 31.1 % — ABNORMAL LOW (ref 36.0–46.0)
Hemoglobin: 10.6 g/dL — ABNORMAL LOW (ref 12.0–15.0)
Immature Granulocytes: 1 %
Lymphocytes Relative: 24 %
Lymphs Abs: 1.5 10*3/uL (ref 0.7–4.0)
MCH: 29 pg (ref 26.0–34.0)
MCHC: 34.1 g/dL (ref 30.0–36.0)
MCV: 85 fL (ref 80.0–100.0)
Monocytes Absolute: 0.7 10*3/uL (ref 0.1–1.0)
Monocytes Relative: 11 %
Neutro Abs: 3.7 10*3/uL (ref 1.7–7.7)
Neutrophils Relative %: 58 %
Platelets: 303 10*3/uL (ref 150–400)
RBC: 3.66 MIL/uL — ABNORMAL LOW (ref 3.87–5.11)
RDW: 13.2 % (ref 11.5–15.5)
WBC: 6.3 10*3/uL (ref 4.0–10.5)
nRBC: 0 % (ref 0.0–0.2)

## 2022-11-19 LAB — BASIC METABOLIC PANEL
Anion gap: 8 (ref 5–15)
BUN: 12 mg/dL (ref 8–23)
CO2: 27 mmol/L (ref 22–32)
Calcium: 8.3 mg/dL — ABNORMAL LOW (ref 8.9–10.3)
Chloride: 97 mmol/L — ABNORMAL LOW (ref 98–111)
Creatinine, Ser: 0.8 mg/dL (ref 0.44–1.00)
GFR, Estimated: >60 mL/min (ref >60)
Glucose, Bld: 107 mg/dL — ABNORMAL HIGH (ref 70–99)
Potassium: 3.1 mmol/L — ABNORMAL LOW (ref 3.5–5.1)
Sodium: 132 mmol/L — ABNORMAL LOW (ref 135–145)

## 2022-11-19 MED ORDER — BISACODYL 5 MG PO TBEC: 5.0000 mg | DELAYED_RELEASE_TABLET | Freq: Every evening | ORAL | Status: DC | PRN

## 2022-11-19 MED ORDER — POTASSIUM CHLORIDE CRYS ER 20 MEQ PO TBCR
40.0000 meq | EXTENDED_RELEASE_TABLET | Freq: Once | ORAL | Status: AC
  Administered 2022-11-19: 40 meq via ORAL
  Filled 2022-11-19: qty 2

## 2022-11-19 NOTE — [place] FL2 (Signed)
[place] MEDICAID FL2 LEVEL OF CARE FORM     IDENTIFICATION  Patient Name: [place] Birthdate: 1928/08/23 Sex: female Admission Date (Current Location): 11/16/2022  [facility] and [name] Number:  Chiropodist and Address:  [facility], [address], [place], [place] [address]      Provider Number: [id number]  Attending Physician Name and Address:  Charise Killian, MD  Relative Name and Phone Number:       Current Level of Care: Hospital Recommended Level of Care: Skilled Nursing Facility Prior Approval Number:    Date Approved/Denied:   PASRR Number: [id number]  Discharge Plan: SNF    Current Diagnoses: Patient Active Problem List   Diagnosis Date Noted   Complicated UTI (urinary tract infection) 11/16/2022   Dementia (HCC) 11/16/2022   Acute metabolic encephalopathy 11/16/2022   Generalized weakness 04/11/2022   Ground-level fall 04/10/2022   COPD with acute exacerbation (HCC) 04/10/2022   Essential hypertension 04/10/2022   Acute encephalopathy 04/10/2022   Hiatal hernia 01/05/2022   Dysuria 09/28/2021   Left wrist pain 07/23/2021   Tendinitis of left wrist 07/23/2021   Closed fracture of distal end of left radius 07/23/2021   Tenosynovitis of left wrist 07/23/2021   Bilateral carotid artery stenosis 05/27/2020   Staring episodes 12/19/2019   B12 deficiency 12/19/2019   Vitamin D deficiency 12/19/2019   Frequent UTI 12/19/2019   Syncope 12/16/2019   Total knee replacement status 12/23/2018   Obstruction of left ureteropelvic junction (UPJ) 02/26/2018   Sepsis (HCC) 02/10/2018   Closed burst fracture of lumbar vertebra with routine healing 04/01/2017   Chronic bronchitis (HCC) 02/18/2017   Age-related osteoporosis with current pathological fracture with routine healing 02/18/2017   Osteoporosis 11/20/2015   COPD (chronic obstructive pulmonary disease) (HCC) 11/20/2015   Cyst of ovary 07/26/2015   Status  post total left knee replacement 04/03/2015   Abnormal chest sounds 08/25/2014   Allergic rhinitis 08/25/2014   Colon polyp 08/25/2014   DDD (degenerative disc disease), cervical 08/25/2014   DDD (degenerative disc disease), lumbar 08/25/2014   H/O non-Hodgkin's lymphoma 08/25/2014   Hyponatremia 08/25/2014   Primary osteoarthritis of right knee 08/25/2014   Subclinical hypothyroidism 08/25/2014   Adnexal mass 06/03/2014   Osteoarthritis 03/19/2011   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   History of smoking 03/11/2008    Orientation RESPIRATION BLADDER Height & Weight     Self, Time, Situation, Place  Normal Continent Weight: 143 lb 4.8 oz (65 kg) Height:  5\' 3"  (160 cm)  BEHAVIORAL SYMPTOMS/MOOD NEUROLOGICAL BOWEL NUTRITION STATUS   (None)  (Dementia) Continent Diet (DYS 2)  AMBULATORY STATUS COMMUNICATION OF NEEDS Skin   Limited Assist Verbally Bruising                       Personal Care Assistance Level of Assistance  Bathing, Feeding, Dressing Bathing Assistance: Limited assistance Feeding assistance: Limited assistance Dressing Assistance: Limited assistance     Functional Limitations Info  Sight, Hearing, Speech Sight Info: Adequate Hearing Info: Adequate Speech Info: Adequate    SPECIAL CARE FACTORS FREQUENCY  PT (By licensed PT), OT (By licensed OT)     PT Frequency: 5 x week OT Frequency: 5 x week            Contractures Contractures Info: Not present    Additional Factors Info  Code Status, Allergies Code Status Info: DNR Allergies Info: Ciprofloxacin, Milk-related Compounds, Latex, Misc. Sulfonamide Containing Compounds, Milk (  Cow), Sulfa Antibiotics           Current Medications (11/19/2022):  This is the current hospital active medication list Current Facility-Administered Medications  Medication Dose Route Frequency Provider Last Rate Last Admin   acetaminophen (TYLENOL) tablet 650 mg  650 mg Oral Q6H PRN Lorretta Harp, MD        albuterol (PROVENTIL) (2.5 MG/3ML) 0.083% nebulizer solution 2.5 mg  2.5 mg Nebulization Q4H PRN Lorretta Harp, MD       bisacodyl (DULCOLAX) EC tablet 5 mg  5 mg Oral QHS PRN Charise Killian, MD       cephALEXin [facility]) capsule 500 mg  500 mg Oral Q8H Dahal, Binaya, MD   500 mg at 11/19/22 1322   dextromethorphan-guaiFENesin (MUCINEX DM) 30-600 MG per 12 hr tablet 1 tablet  1 tablet Oral BID PRN Lorretta Harp, MD       enoxaparin (LOVENOX) injection 40 mg  40 mg Subcutaneous Q24H Lorretta Harp, MD   40 mg at 11/18/22 2129   hydrALAZINE (APRESOLINE) injection 5 mg  5 mg Intravenous Q2H PRN Lorretta Harp, MD       mometasone-formoterol [facility]) 100-5 MCG/ACT inhaler 2 puff  2 puff Inhalation BID Mila Merry A, RPH   2 puff at 11/18/22 2048   ondansetron (ZOFRAN) injection 4 mg  4 mg Intravenous Q8H PRN Lorretta Harp, MD   4 mg at 11/17/22 0809   potassium chloride SA (KLOR-CON M) CR tablet 40 mEq  40 mEq Oral Once Charise Killian, MD       saccharomyces boulardii (FLORASTOR) capsule 250 mg  250 mg Oral BID Lorin Glass, MD   250 mg at 11/19/22 0940   senna-docusate (Senokot-S) tablet 1 tablet  1 tablet Oral QHS Lorin Glass, MD   1 tablet at 11/18/22 2129   sodium chloride tablet 1 g  1 g Oral BID WC Lorretta Harp, MD   1 g at 11/19/22 0940   tiotropium (SPIRIVA) inhalation capsule ([facility] use ONLY) 18 mcg  18 mcg Inhalation Daily Lorretta Harp, MD   18 mcg at 11/19/22 1610     Discharge Medications: Please see discharge summary for a list of discharge medications.  Relevant Imaging Results:  Relevant Lab Results:   Additional Information SS#: [id number]. Lives at [facility].  Margarito Liner, LCSW

## 2022-11-19 NOTE — Progress Notes (Signed)
Mobility Specialist - Progress Note   11/19/22 1600  Mobility  Activity Ambulated with assistance in room;Ambulated with assistance to bathroom;Transferred from chair to bed  Level of Assistance Standby assist, set-up cues, supervision of patient - no hands on  Assistive Device Front wheel walker  Distance Ambulated (ft) 35 ft  Activity Response Tolerated well  $Mobility charge 1 Mobility     Pt sitting in chair upon arrival, utilizing RA. Pt requesting assistance to bathroom. STS from chair with minA. Ambulation in room with minG. VC for staying close/inside RW. No LOB. Able to perform seated peri-care without assistance. STS from elevated toilet seat with minG. Pt returned to bed with alarm set, needs in reach. Family at bedside.    Monica Singh Mobility Specialist 11/19/22, 4:04 PM

## 2022-11-19 NOTE — Progress Notes (Signed)
Occupational Therapy Treatment Patient Details Name: Monica Singh MRN: [id number] DOB: 07-25-1928 Today's Date: 11/19/2022   History of present illness Pt is a 89 y.o. female presenting to hospital 11/16/22 with confusion and urinary incontinence and fever.  Pt admitted with complicated UTI, acute metabolic encephalopathy, hyponatremia.  PMH includes recurrent UTI, L sided nephroureteral stent, htn, COPD, dementia, non-Hodgkin lymphoma, adnexal mass, COPD, CKD, back sx, bil TKA.   OT comments  Pt is seated on toilet with daughter present on arrival. She denies pain. Pt performed STS from toilet with CGA and all aspects of toileting with CGA. Pt ambulated from toilet to sink with RW and CGA to perform hand hygiene and oral care standing at sink with SBA for ~5-8 minutes. Pt agreeable to ambulate ~75 feet using RW with CGA, verb cues for upright posture and staying within base of walker.  Pt returned to recliner with daughter present and all needs in place and will cont to require skilled acute OT services to maximize her safety and IND to return to PLOF. Handoff to PT. She is progressing well and may be able to return to ALF with [facility] therapy services.       If plan is discharge home, recommend the following:  A little help with walking and/or transfers;A little help with bathing/dressing/bathroom;Assistance with cooking/housework;Help with stairs or ramp for entrance;Supervision due to cognitive status   Equipment Recommendations  Other (comment) (defer to next venue)    Recommendations for Other Services      Precautions / Restrictions Restrictions Weight Bearing Restrictions: No       Mobility Bed Mobility               General bed mobility comments: on toilet with daughter present on entry    Transfers Overall transfer level: Needs assistance Equipment used: Rolling walker (2 wheels) Transfers: Sit to/from Stand Sit to Stand: Contact guard assist            General transfer comment: CGA for STS from toilet and ambulated ~75 feet with CGA and RW with cues for turning/attending to task and staying upright and in walker base     Balance Overall balance assessment: Needs assistance Sitting-balance support: No upper extremity supported, Feet supported Sitting balance-Leahy Scale: Good     Standing balance support: Bilateral upper extremity supported, During functional activity, Reliant on assistive device for balance Standing balance-Leahy Scale: Fair Standing balance comment: able to stand and perform oral care and hand hygiene with SBA                           ADL either performed or assessed with clinical judgement   ADL Overall ADL's : Needs assistance/impaired     Grooming: Wash/dry hands;Oral care;Standing;Supervision/safety Grooming Details (indicate cue type and reason): SBA for standing balance at sink for oral care and hand hygiene                 Toilet Transfer: Contact guard assist;Comfort height toilet;Grab bars Toilet Transfer Details (indicate cue type and reason): BSC over toilet to increase height Toileting- Clothing Manipulation and Hygiene: Contact guard assist;Sitting/lateral lean;Sit to/from stand       Functional mobility during ADLs: Contact guard assist;Rolling walker (2 wheels)      Extremity/Trunk Assessment Upper Extremity Assessment Upper Extremity Assessment: Overall WFL for tasks assessed   Lower Extremity Assessment Lower Extremity Assessment: Generalized weakness        Vision  Perception     Praxis      Cognition Arousal: Alert Behavior During Therapy: WFL for tasks assessed/performed                                   General Comments: Oriented to person and general situation but not time or place (pt typically not oriented to time only)        Exercises      Shoulder Instructions       General Comments      Pertinent Vitals/ Pain        Pain Assessment Pain Assessment: No/denies pain  Home Living                                          Prior Functioning/Environment              Frequency  Min 1X/week        Progress Toward Goals  OT Goals(current goals can now be found in the care plan section)  Progress towards OT goals: Progressing toward goals  Acute Rehab OT Goals Patient Stated Goal: improve strength and mobility OT Goal Formulation: With patient/family Time For Goal Achievement: 12/02/22 Potential to Achieve Goals: Good  Plan      Co-evaluation                 AM-PAC OT "6 Clicks" Daily Activity     Outcome Measure   Help from another person eating meals?: None Help from another person taking care of personal grooming?: None Help from another person toileting, which includes using toliet, bedpan, or urinal?: A Little Help from another person bathing (including washing, rinsing, drying)?: A Little Help from another person to put on and taking off regular upper body clothing?: A Little Help from another person to put on and taking off regular lower body clothing?: A Little 6 Click Score: 20    End of Session Equipment Utilized During Treatment: Rolling walker (2 wheels);Gait belt  OT Visit Diagnosis: History of falling (Z91.81);Muscle weakness (generalized) (M62.81);Other abnormalities of gait and mobility (R26.89)   Activity Tolerance Patient tolerated treatment well   Patient Left with family/visitor present;in chair;with chair alarm set;with call bell/phone within reach   Nurse Communication Mobility status        Time: 6644-0347 OT Time Calculation (min): 25 min  Charges: OT General Charges $OT Visit: 1 Visit OT Treatments $Self Care/Home Management : 8-22 mins $Therapeutic Activity: 8-22 mins  Kaitlinn Iversen, OTR/L  11/19/22, 1:41 PM  Braeleigh Pyper E Chiana Wamser 11/19/2022, 1:38 PM

## 2022-11-19 NOTE — Plan of Care (Signed)

## 2022-11-19 NOTE — Progress Notes (Signed)
PROGRESS NOTE   HPI was taken from Dr. Clyde Lundborg: Monica Singh is a 89 y.o. female with medical history significant of recurrent UTI, left-sided nephroureteral stent in place, HTN, COPD, subclinical hypothyroidism, dementia, hyponatremia, non-Hodgkin lymphoma, adnexal mass, who presents with altered mental status and weakness, fever.   Per her daughter at the bedside, patient is noted to be more confused than her baseline with weakness today.  At her normal baseline, patient is orientated to the person and place, always confused about time. Pt is normally awake and ambulatory with a walker. Today, pt is so weak that she stay in the bed all the time.  When I saw pt in ED, patient is confused, able to recognizes her daughter, but not orientated to the place and time.  Patient is agitated in ED. She has fever and chills.  Her temperature is 100.4 in ED.  She has poor appetite and decreased oral intake.  No chest pain or abdominal pain per her daughter, but patient seems to have tenderness in the right lower abdomen on examination.  No acute respiratory distress or cough noted.  Patient has vomited once, no diarrhea.  Not sure if patient has symptoms of UTI.   Data reviewed independently and ED Course: pt was found to have positive urinalysis (cloudy appearance, large amount of leukocyte, positive nitrite, many bacteria, WBC> 50), WBC 11.5, creatinine 0.91, BUN 13, GFR 59, sodium 129, lactic acid is 0.9, negative PCR for COVID.  Temperature 100.4, blood pressure 118/70, heart rate of 108--> 87, RR 18, oxygen sat 96% on room air.  Chest x-ray negative.  Patient is admitted to MedSurg bed as inpatient.   CT-abdomen/pelvis 1. Mild enhancement of the right renal pelvis and proximal ureter, suspicious for urinary tract infection. Recommend correlation with urinalysis. 2. Left-sided nephroureteral stent in place. Mild chronic dilatation of the left renal pelvis. 3. Left adnexal cyst measuring 7.1 x 5.1 cm,  minimally increased in size from 2020 exam when it measured 5.9 x 4.4 cm. Pelvic ultrasound can be performed for follow-up as clinically indicated given patient's advanced age. 4. Moderate to large hiatal hernia. 5. Colonic diverticulosis without diverticulitis.     [place]  ZHY:[id number] DOB: 1928-02-20 DOA: 11/16/2022 PCP: Malva Limes, MD   Assessment & Plan:   Principal Problem:   Complicated UTI (urinary tract infection) Active Problems:   Acute metabolic encephalopathy   Hyponatremia   COPD (chronic obstructive pulmonary disease) (HCC)   Essential hypertension   H/O non-Hodgkin's lymphoma   Adnexal mass  Assessment and Plan: Sepsis: met criteria w/ fever, tachycardia, leukocytosis & UTI & right pyelonephritis.   Complicated UTI & right pyelonephritis: w/ left ureteral stent & hx of carbapenem resistant pseudomonas. Continue on keflex. Chronically pt is on methenamine daily but this is on hold currently   Acute metabolic encephalopathy: likely secondary to UTI. Mental status improved. Hx of dementia.   Hypokalemia: potassium given   Chronic hyponatremia: labile. Will continue to monitor   Normocytic anemia: H&H are stable. No need for a transfusion currently   COPD: w/o exacerbation. Bronchodilators prn. Encourage incentive spirometry   HTN: holding home amlodipine. Hydralazine prn    Hx non-Hodgkin's lymphoma: f/u outpatient w/ onco   Adnexal mass: known left adnexal cystic mass, CT scan showed slightly increased size since 2020. Follow-up with PCP for outpatient workup   DVT prophylaxis: lovenox  Code Status: DNR Family Communication: discussed pt's care w/ pt's daughter, [name], and answered her questions  Disposition  Plan: likely d/c to SNF   Level of care: Med-Surg Status is: Inpatient Remains inpatient appropriate because: severity of illness    Consultants:    Procedures:   Antimicrobials: keflex    Subjective: Pt c/o  fatigue   Objective: Vitals:   11/18/22 1400 11/18/22 1458 11/18/22 2024 11/19/22 0350  BP: 116/66 112/68 128/61 138/68  Pulse: (!) 104 96 90 83  Resp: 16 16 18 18   Temp: 98.6 F (37 C) 98.1 F (36.7 C) 98.4 F (36.9 C) (!) 97.5 F (36.4 C)  TempSrc:   Oral Oral  SpO2: 98% 94% 95% 97%  Weight:      Height:        Intake/Output Summary (Last 24 hours) at 11/19/2022 0814 Last data filed at 11/18/2022 1907 Gross per 24 hour  Intake 120 ml  Output --  Net 120 ml   Filed Weights   11/16/22 1421  Weight: 65 kg    Examination:  General exam: Appears calm and comfortable  Respiratory system: Clear to auscultation. Respiratory effort normal. Cardiovascular system: S1 & S2+. No  rubs, gallops or clicks.  Gastrointestinal system: Abdomen is nondistended, soft and nontender. Normal bowel sounds heard. Central nervous system: Alert and awake. Moves all extremities  Psychiatry: Judgement and insight appears poor. Flat mood and affect    Data Reviewed: I have personally reviewed following labs and imaging studies  CBC: Recent Labs  Lab 11/16/22 1424 11/17/22 0440 11/18/22 0536 11/19/22 0447  WBC 11.5* 14.6* 8.3 6.3  NEUTROABS  --   --  5.7 3.7  HGB 11.9* 10.4* 10.6* 10.6*  HCT 36.4 30.8* 31.7* 31.1*  MCV 88.3 87.3 87.6 85.0  PLT 318 263 262 303   Basic Metabolic Panel: Recent Labs  Lab 11/16/22 1424 11/17/22 0440 11/18/22 0536 11/19/22 0447  NA 129* 132* 133* 132*  K 3.7 3.2* 3.6 3.1*  CL 96* 102 99 97*  CO2 22 24 23 27   GLUCOSE 173* 122* 99 107*  BUN 13 12 16 12   CREATININE 0.91 0.83 0.91 0.80  CALCIUM 8.5* 7.8* 8.2* 8.3*   GFR: Estimated Creatinine Clearance: 40.7 mL/min (by C-G formula based on SCr of 0.8 mg/dL). Liver Function Tests: Recent Labs  Lab 11/16/22 1424  AST 14*  ALT 10  ALKPHOS 56  BILITOT 0.7  PROT 7.0  ALBUMIN 3.5   Recent Labs  Lab 11/16/22 1424  LIPASE 22   No results for input(s): "AMMONIA" in the last 168  hours. Coagulation Profile: No results for input(s): "INR", "PROTIME" in the last 168 hours. Cardiac Enzymes: No results for input(s): "CKTOTAL", "CKMB", "CKMBINDEX", "TROPONINI" in the last 168 hours. BNP (last 3 results) No results for input(s): "PROBNP" in the last 8760 hours. HbA1C: No results for input(s): "HGBA1C" in the last 72 hours. CBG: No results for input(s): "GLUCAP" in the last 168 hours. Lipid Profile: No results for input(s): "CHOL", "HDL", "LDLCALC", "TRIG", "CHOLHDL", "LDLDIRECT" in the last 72 hours. Thyroid Function Tests: No results for input(s): "TSH", "T4TOTAL", "FREET4", "T3FREE", "THYROIDAB" in the last 72 hours. Anemia Panel: No results for input(s): "VITAMINB12", "FOLATE", "FERRITIN", "TIBC", "IRON", "RETICCTPCT" in the last 72 hours. Sepsis Labs: Recent Labs  Lab 11/16/22 1656 11/16/22 1700  PROCALCITON 0.21  --   LATICACIDVEN  --  0.9    Recent Results (from the past 240 hour(s))  Blood Culture (routine x 2)     Status: None (Preliminary result)   Collection Time: 11/16/22  5:00 PM   Specimen: BLOOD  Result Value Ref Range Status   Specimen Description BLOOD LEFT ANTECUBITAL  Final   Special Requests   Final    BOTTLES DRAWN AEROBIC AND ANAEROBIC Blood Culture adequate volume   Culture   Final    NO GROWTH 3 DAYS Performed at [facility], [address], [place], [place] [address]    Report Status PENDING  Incomplete  SARS Coronavirus 2 by RT PCR (hospital order, performed in [facility] hospital lab) *cepheid single result test* Anterior Nasal Swab     Status: None   Collection Time: 11/16/22  5:00 PM   Specimen: Anterior Nasal Swab  Result Value Ref Range Status   SARS Coronavirus 2 by RT PCR NEGATIVE NEGATIVE Final    Comment: (NOTE) SARS-CoV-2 target nucleic acids are NOT DETECTED.  The SARS-CoV-2 RNA is generally detectable in upper and lower respiratory specimens during the acute phase of infection. The  lowest concentration of SARS-CoV-2 viral copies this assay can detect is 250 copies / mL. A negative result does not preclude SARS-CoV-2 infection and should not be used as the sole basis for treatment or other patient management decisions.  A negative result may occur with improper specimen collection / handling, submission of specimen other than nasopharyngeal swab, presence of viral mutation(s) within the areas targeted by this assay, and inadequate number of viral copies (<250 copies / mL). A negative result must be combined with clinical observations, patient history, and epidemiological information.  Fact Sheet for Patients:   RoadLapTop.co.za  Fact Sheet for Healthcare Providers: http://kim-miller.com/  This test is not yet approved or  cleared by the [place] FDA and has been authorized for detection and/or diagnosis of SARS-CoV-2 by FDA under an Emergency Use Authorization (EUA).  This EUA will remain in effect (meaning this test can be used) for the duration of the COVID-19 declaration under Section 564(b)(1) of the Act, 21 U.S.C. section 360bbb-3(b)(1), unless the authorization is terminated or revoked sooner.  Performed at [facility], [address] [place], [place] [address]   Urine Culture     Status: Abnormal   Collection Time: 11/16/22  7:29 PM   Specimen: Urine, Random  Result Value Ref Range Status   Specimen Description   Final    URINE, RANDOM Performed at [facility], [address] [place], [place] [address]    Special Requests   Final    NONE Performed at [facility], [address] [place], [place] [address]    Culture MULTIPLE SPECIES PRESENT, SUGGEST RECOLLECTION (A)  Final   Report Status 11/18/2022 FINAL  Final  Blood Culture (routine x 2)     Status: None (Preliminary result)   Collection Time: 11/16/22  7:46 PM   Specimen: BLOOD  Result Value Ref Range  Status   Specimen Description BLOOD RIGHT ANTECUBITAL  Final   Special Requests   Final    BOTTLES DRAWN AEROBIC AND ANAEROBIC Blood Culture results may not be optimal due to an excessive volume of blood received in culture bottles   Culture   Final    NO GROWTH 3 DAYS Performed at [facility], [address], [place], [place] [address]    Report Status PENDING  Incomplete         Radiology Studies: No results found.      Scheduled Meds:  cephALEXin  500 mg Oral Q8H   enoxaparin (LOVENOX) injection  40 mg Subcutaneous Q24H   mometasone-formoterol  2 puff Inhalation BID  saccharomyces boulardii  250 mg Oral BID   senna-docusate  1 tablet Oral QHS   sodium chloride  1 g Oral BID WC   tiotropium  18 mcg Inhalation Daily   Continuous Infusions:   LOS: 3 days       Charise Killian, MD [facility] Pager [phone]  If 7PM-7AM, please contact night-coverage [email or web address] 11/19/2022, 8:14 AM

## 2022-11-19 NOTE — TOC Initial Note (Signed)
Transition of Care [facility]) - Initial/Assessment Note    Patient Details  Name: Monica Singh MRN: [id number] Date of Birth: 07/03/1928  [facility] of [facility]) CM/SW Contact:    Margarito Liner, LCSW Phone Number: 11/19/2022, 1:53 PM  Clinical Narrative:   CSW met with patient. Daughter, [name], at bedside. CSW introduced role and explained that PT recommendations would be discussed. They are agreeable to SNF placement. Preferences are [facility], [facility], and [facility]. CSW left messages for [facility] admissions coordinator asking  her to review. No further concerns. CSW encouraged patient and her daughter to contact CSW as needed. CSW will continue to follow patient for support and facilitate discharge to SNF once medically stable.               Expected Discharge Plan: Skilled Nursing Facility Barriers to Discharge: Continued Medical Work up, English as a second language teacher   Patient Goals and CMS Choice   CMS Medicare.gov Compare Post Acute Care list provided to:: Patient (Daughter at bedside)        Expected Discharge Plan and Services     Post Acute Care Choice: Skilled Nursing Facility Living arrangements for the past 2 months: Assisted Living Facility                                      Prior Living Arrangements/Services Living arrangements for the past 2 months: Assisted Living Facility Lives with:: Facility Resident Patient language and need for interpreter reviewed:: Yes Do you feel safe going back to the place where you live?: Yes      Need for Family Participation in Patient Care: Yes (Comment) Care giver support system in place?: Yes (comment)   Criminal Activity/Legal Involvement Pertinent to Current Situation/Hospitalization: No - Comment as needed  Activities of Daily Living   ADL Screening (condition at time of admission) Independently performs ADLs?: No Does the patient have a NEW difficulty with  bathing/dressing/toileting/self-feeding that is expected to last >3 days?: Yes (Initiates electronic notice to provider for possible OT consult) Does the patient have a NEW difficulty with getting in/out of bed, walking, or climbing stairs that is expected to last >3 days?: Yes (Initiates electronic notice to provider for possible PT consult) Does the patient have a NEW difficulty with communication that is expected to last >3 days?: No Is the patient deaf or have difficulty hearing?: No Does the patient have difficulty seeing, even when wearing glasses/contacts?: No Does the patient have difficulty concentrating, remembering, or making decisions?: Yes  Permission Sought/Granted Permission sought to share information with : Facility Medical sales representative, Family Supports Permission granted to share information with : Yes, Verbal Permission Granted  Share Information with NAME: [place]  Permission granted to share info w AGENCY: SNF's  Permission granted to share info w Relationship: Daughter  Permission granted to share info w Contact Information: [phone]  Emotional Assessment Appearance:: Appears stated age Attitude/Demeanor/Rapport: Engaged, Gracious Affect (typically observed): Accepting, Appropriate, Calm, Pleasant Orientation: : Oriented to Self, Oriented to Place, Oriented to  Time, Oriented to Situation Alcohol / Substance Use: Not Applicable Psych Involvement: No (comment)  Admission diagnosis:  UTI (urinary tract infection) [N39.0] Complicated UTI (urinary tract infection) [N39.0] Cystitis [N30.90] Sepsis, due to unspecified organism, unspecified whether acute organ dysfunction present [facility]) [A41.9] Patient Active Problem List   Diagnosis Date Noted   Complicated UTI (urinary tract infection) 11/16/2022   Dementia (HCC) 11/16/2022  Acute metabolic encephalopathy 11/16/2022   Generalized weakness 04/11/2022   Ground-level fall 04/10/2022   COPD with acute  exacerbation (HCC) 04/10/2022   Essential hypertension 04/10/2022   Acute encephalopathy 04/10/2022   Hiatal hernia 01/05/2022   Dysuria 09/28/2021   Left wrist pain 07/23/2021   Tendinitis of left wrist 07/23/2021   Closed fracture of distal end of left radius 07/23/2021   Tenosynovitis of left wrist 07/23/2021   Bilateral carotid artery stenosis 05/27/2020   Staring episodes 12/19/2019   B12 deficiency 12/19/2019   Vitamin D deficiency 12/19/2019   Frequent UTI 12/19/2019   Syncope 12/16/2019   Total knee replacement status 12/23/2018   Obstruction of left ureteropelvic junction (UPJ) 02/26/2018   Sepsis (HCC) 02/10/2018   Closed burst fracture of lumbar vertebra with routine healing 04/01/2017   Chronic bronchitis (HCC) 02/18/2017   Age-related osteoporosis with current pathological fracture with routine healing 02/18/2017   Osteoporosis 11/20/2015   COPD (chronic obstructive pulmonary disease) (HCC) 11/20/2015   Cyst of ovary 07/26/2015   Status post total left knee replacement 04/03/2015   Abnormal chest sounds 08/25/2014   Allergic rhinitis 08/25/2014   Colon polyp 08/25/2014   DDD (degenerative disc disease), cervical 08/25/2014   DDD (degenerative disc disease), lumbar 08/25/2014   H/O non-Hodgkin's lymphoma 08/25/2014   Hyponatremia 08/25/2014   Primary osteoarthritis of right knee 08/25/2014   Subclinical hypothyroidism 08/25/2014   Adnexal mass 06/03/2014   Osteoarthritis 03/19/2011   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 [place]  [address] Phone: [phone] Fax: [phone]     Social Determinants of Health (SDOH) Social History: SDOH Screenings   Food Insecurity: Patient Unable To Answer (11/18/2022)  Housing: Patient Unable To Answer (11/18/2022)  Transportation Needs: Patient Unable To Answer (11/18/2022)  Utilities: Patient Unable To Answer (11/18/2022)  Alcohol Screen: Low Risk  (08/19/2022)  Depression (PHQ2-9): Low Risk  (08/19/2022)  Financial Resource Strain: Low Risk  (08/19/2022)  Physical Activity: Insufficiently Active (08/19/2022)  Social Connections: Moderately Isolated (08/19/2022)  Stress: No Stress Concern Present (08/19/2022)  Tobacco Use: Medium Risk (11/18/2022)  Health Literacy: Inadequate Health Literacy (08/19/2022)   SDOH Interventions:     Readmission Risk Interventions     No data to display

## 2022-11-19 NOTE — Progress Notes (Signed)
Physical Therapy Treatment Patient Details Name: Monica Singh MRN: [id number] DOB: Sep 09, 1928 Today's Date: 11/19/2022   History of Present Illness Pt is a 89 y.o. female presenting to hospital 11/16/22 with confusion and urinary incontinence and fever.  Pt admitted with complicated UTI, acute metabolic encephalopathy, hyponatremia.  PMH includes recurrent UTI, L sided nephroureteral stent, htn, COPD, dementia, non-Hodgkin lymphoma, adnexal mass, COPD, CKD, back sx, bil TKA.    PT Comments  Pt finishing OT session upon PT arrival; pt's daughter present; pt agreeable to therapy.  During session pt CGA with transfers and ambulation 150 feet with RW use.  Gait impairments noted.  Limited ambulation distance d/t pt fatigue.  Pt oriented to name and medical concerns/general situation but appeared very surprised to find out she was in the hospital.  Will continue to focus on strengthening, balance, endurance, and progressive functional mobility per pt tolerance.    If plan is discharge home, recommend the following: A little help with walking and/or transfers;A little help with bathing/dressing/bathroom;Assistance with cooking/housework;Direct supervision/assist for medications management;Direct supervision/assist for financial management;Assist for transportation;Help with stairs or ramp for entrance   Can travel by private vehicle        Equipment Recommendations  Other (comment) (pt has RW at home already)    Recommendations for Other Services       Precautions / Restrictions Precautions Precautions: Fall Restrictions Weight Bearing Restrictions: No     Mobility  Bed Mobility               General bed mobility comments: Deferred (pt sitting in recliner beginning/end of session)    Transfers Overall transfer level: Needs assistance Equipment used: Rolling walker (2 wheels) Transfers: Sit to/from Stand Sit to Stand: Contact guard assist           General transfer  comment: increased effort to stand from recliner; vc's for UE placement    Ambulation/Gait Ambulation/Gait assistance: Contact guard assist Gait Distance (Feet): 150 Feet Assistive device: Rolling walker (2 wheels) Gait Pattern/deviations: Step-through pattern, Decreased step length - right, Decreased step length - left Gait velocity: decreased     General Gait Details: increased time to take steps intermittently; vc's for upright posture and staying closer to RW (pt's daughter reports pt generally has flexed posture during ambulation); limited d/t pt fatigue   Stairs             Wheelchair Mobility     Tilt Bed    Modified Rankin (Stroke Patients Only)       Balance Overall balance assessment: Needs assistance   Sitting balance-Leahy Scale: Good Sitting balance - Comments: steady reaching within BOS   Standing balance support: Bilateral upper extremity supported, During functional activity, Reliant on assistive device for balance Standing balance-Leahy Scale: Good Standing balance comment: no loss of balance noted during ambulation                            Cognition Arousal: Alert Behavior During Therapy: WFL for tasks assessed/performed                                   General Comments: Oriented to person and general situation but not time or place (pt typically not oriented to time only)        Exercises      General Comments  Nursing cleared pt for participation in  physical therapy.  Pt agreeable to PT session.      Pertinent Vitals/Pain Pain Assessment Pain Assessment: No/denies pain Vitals (HR and SpO2 on room air) stable and WFL throughout treatment session.    Home Living                          Prior Function            PT Goals (current goals can now be found in the care plan section) Acute Rehab PT Goals Patient Stated Goal: to improve mobility and strength PT Goal Formulation: With  patient/family Time For Goal Achievement: 12/02/22 Potential to Achieve Goals: Good Progress towards PT goals: Progressing toward goals    Frequency    Min 1X/week      PT Plan      Co-evaluation              AM-PAC PT "6 Clicks" Mobility   Outcome Measure  Help needed turning from your back to your side while in a flat bed without using bedrails?: A Little Help needed moving from lying on your back to sitting on the side of a flat bed without using bedrails?: A Little Help needed moving to and from a bed to a chair (including a wheelchair)?: A Little Help needed standing up from a chair using your arms (e.g., wheelchair or bedside chair)?: A Little Help needed to walk in hospital room?: A Little Help needed climbing 3-5 steps with a railing? : A Little 6 Click Score: 18    End of Session Equipment Utilized During Treatment: Gait belt Activity Tolerance: Patient limited by fatigue Patient left: in chair;with call bell/phone within reach;with chair alarm set;with family/visitor present Nurse Communication: Mobility status;Precautions PT Visit Diagnosis: Other abnormalities of gait and mobility (R26.89);Muscle weakness (generalized) (M62.81)     Time: 5784-6962 PT Time Calculation (min) (ACUTE ONLY): 23 min  Charges:    $Gait Training: 8-22 mins $Therapeutic Activity: 8-22 mins PT General Charges $$ ACUTE PT VISIT: 1 Visit                     Hendricks Limes, PT 11/19/22, 6:00 PM

## 2022-11-19 NOTE — Plan of Care (Signed)
  Problem: Education: Goal: Knowledge of General Education information will improve Description: Including pain rating scale, medication(s)/side effects and non-pharmacologic comfort measures Outcome: Progressing   Problem: Health Behavior/Discharge Planning: Goal: Ability to manage health-related needs will improve Outcome: Progressing   Problem: Clinical Measurements: Goal: Ability to maintain clinical measurements within normal limits will improve Outcome: Progressing Goal: Will remain free from infection Outcome: Progressing Goal: Diagnostic test results will improve Outcome: Progressing Goal: Respiratory complications will improve Outcome: Progressing Goal: Cardiovascular complication will be avoided Outcome: Progressing   Problem: Activity: Goal: Risk for activity intolerance will decrease Outcome: Progressing  Pt ambulated in the hallway rolling walker by Physical Therapist this shift Problem: Nutrition: Goal: Adequate nutrition will be maintained Outcome: Progressing   Problem: Coping: Goal: Level of anxiety will decrease Outcome: Progressing   Problem: Elimination: Goal: Will not experience complications related to bowel motility Outcome: Progressing Goal: Will not experience complications related to urinary retention Outcome: Progressing   Problem: Pain Managment: Goal: General experience of comfort will improve Outcome: Progressing   Problem: Safety: Goal: Ability to remain free from injury will improve Outcome: Progressing   Problem: Skin Integrity: Goal: Risk for impaired skin integrity will decrease Outcome: Progressing

## 2022-11-20 DIAGNOSIS — N390 Urinary tract infection, site not specified: Secondary | ICD-10-CM | POA: Diagnosis not present

## 2022-11-20 LAB — CBC WITH DIFFERENTIAL/PLATELET
Abs Immature Granulocytes: 0.04 10*3/uL (ref 0.00–0.07)
Basophils Absolute: 0.1 10*3/uL (ref 0.0–0.1)
Basophils Relative: 1 %
Eosinophils Absolute: 0.3 10*3/uL (ref 0.0–0.5)
Eosinophils Relative: 5 %
HCT: 32.8 % — ABNORMAL LOW (ref 36.0–46.0)
Hemoglobin: 11.2 g/dL — ABNORMAL LOW (ref 12.0–15.0)
Immature Granulocytes: 1 %
Lymphocytes Relative: 22 %
Lymphs Abs: 1.5 10*3/uL (ref 0.7–4.0)
MCH: 29 pg (ref 26.0–34.0)
MCHC: 34.1 g/dL (ref 30.0–36.0)
MCV: 85 fL (ref 80.0–100.0)
Monocytes Absolute: 0.6 10*3/uL (ref 0.1–1.0)
Monocytes Relative: 10 %
Neutro Abs: 4.1 10*3/uL (ref 1.7–7.7)
Neutrophils Relative %: 61 %
Platelets: 336 10*3/uL (ref 150–400)
RBC: 3.86 MIL/uL — ABNORMAL LOW (ref 3.87–5.11)
RDW: 13.2 % (ref 11.5–15.5)
WBC: 6.6 10*3/uL (ref 4.0–10.5)
nRBC: 0 % (ref 0.0–0.2)

## 2022-11-20 LAB — BASIC METABOLIC PANEL
Anion gap: 11 (ref 5–15)
BUN: 9 mg/dL (ref 8–23)
CO2: 24 mmol/L (ref 22–32)
Calcium: 8.6 mg/dL — ABNORMAL LOW (ref 8.9–10.3)
Chloride: 97 mmol/L — ABNORMAL LOW (ref 98–111)
Creatinine, Ser: 0.7 mg/dL (ref 0.44–1.00)
GFR, Estimated: >60 mL/min (ref >60)
Glucose, Bld: 103 mg/dL — ABNORMAL HIGH (ref 70–99)
Potassium: 3.4 mmol/L — ABNORMAL LOW (ref 3.5–5.1)
Sodium: 132 mmol/L — ABNORMAL LOW (ref 135–145)

## 2022-11-20 MED ORDER — POTASSIUM CHLORIDE CRYS ER 20 MEQ PO TBCR
40.0000 meq | EXTENDED_RELEASE_TABLET | Freq: Once | ORAL | Status: AC
  Administered 2022-11-20: 40 meq via ORAL
  Filled 2022-11-20: qty 2

## 2022-11-20 NOTE — Progress Notes (Signed)
Physical Therapy Treatment Patient Details Name: Monica Singh MRN: [id number] DOB: 03/11/1928 Today's Date: 11/20/2022   History of Present Illness Pt is a 89 y.o. female presenting to hospital 11/16/22 with confusion and urinary incontinence and fever.  Pt admitted with complicated UTI, acute metabolic encephalopathy, hyponatremia.  PMH includes recurrent UTI, L sided nephroureteral stent, htn, COPD, dementia, non-Hodgkin lymphoma, adnexal mass, COPD, CKD, back sx, bil TKA.    PT Comments  Patient received coming out of bathroom with NT. She requires encouragement to ambulate out in hallway. She ambulates with slow pace, flexed trunk posture. Requires cues to stay close to and inside RW and occasionally requires cues for obstacle avoidance. Patient will continue to benefit from skilled PT to improve endurance, strength and independence.      If plan is discharge home, recommend the following: A little help with walking and/or transfers;A little help with bathing/dressing/bathroom;Assistance with cooking/housework;Direct supervision/assist for medications management;Direct supervision/assist for financial management;Assist for transportation;Help with stairs or ramp for entrance   Can travel by private vehicle     Yes  Equipment Recommendations  None recommended by PT    Recommendations for Other Services       Precautions / Restrictions Precautions Precautions: Fall Restrictions Weight Bearing Restrictions: No     Mobility  Bed Mobility Overal bed mobility: Needs Assistance Bed Mobility: Sit to Supine       Sit to supine: Min assist        Transfers   Equipment used: Rolling walker (2 wheels) Transfers: Sit to/from Stand Sit to Stand: Contact guard assist                Ambulation/Gait Ambulation/Gait assistance: Contact guard assist Gait Distance (Feet): 150 Feet Assistive device: Rolling walker (2 wheels) Gait Pattern/deviations: Step-through  pattern, Decreased step length - right, Decreased step length - left, Trunk flexed Gait velocity: decreased     General Gait Details: Patient requires cues for safety with AD, staying close and inside RW. Patient typically uses 4 wheeled walker   Stairs             Wheelchair Mobility     Tilt Bed    Modified Rankin (Stroke Patients Only)       Balance Overall balance assessment: Needs assistance Sitting-balance support: Feet supported Sitting balance-Leahy Scale: Good     Standing balance support: Bilateral upper extremity supported, During functional activity, Reliant on assistive device for balance Standing balance-Leahy Scale: Good                              Cognition Arousal: Alert Behavior During Therapy: WFL for tasks assessed/performed                                   General Comments: Oriented to person and general situation but not time or place (pt typically not oriented to time only)        Exercises      General Comments        Pertinent Vitals/Pain Pain Assessment Pain Assessment: Faces Faces Pain Scale: Hurts a little bit Pain Location: B calves Pain Descriptors / Indicators: Discomfort Pain Intervention(s): Monitored during session    Home Living                          Prior Function  PT Goals (current goals can now be found in the care plan section) Acute Rehab PT Goals Patient Stated Goal: to improve mobility and strength, return to ALF PT Goal Formulation: With patient/family Time For Goal Achievement: 12/02/22 Potential to Achieve Goals: Good Progress towards PT goals: Progressing toward goals    Frequency    Min 1X/week      PT Plan      Co-evaluation              AM-PAC PT "6 Clicks" Mobility   Outcome Measure  Help needed turning from your back to your side while in a flat bed without using bedrails?: A Little Help needed moving from lying on your  back to sitting on the side of a flat bed without using bedrails?: A Little Help needed moving to and from a bed to a chair (including a wheelchair)?: A Little Help needed standing up from a chair using your arms (e.g., wheelchair or bedside chair)?: A Little Help needed to walk in hospital room?: A Little Help needed climbing 3-5 steps with a railing? : A Lot 6 Click Score: 17    End of Session Equipment Utilized During Treatment: Gait belt Activity Tolerance: Patient tolerated treatment well;Patient limited by fatigue Patient left: in bed;with call bell/phone within reach;with family/visitor present;with bed alarm set Nurse Communication: Mobility status PT Visit Diagnosis: Other abnormalities of gait and mobility (R26.89);Muscle weakness (generalized) (M62.81);Difficulty in walking, not elsewhere classified (R26.2)     Time: 1610-9604 PT Time Calculation (min) (ACUTE ONLY): 16 min  Charges:    $Gait Training: 8-22 mins PT General Charges $$ ACUTE PT VISIT: 1 Visit                     Havilah Singh, PT, GCS 11/20/22,2:37 PM

## 2022-11-20 NOTE — TOC Progression Note (Addendum)
Transition of Care [facility]) - Progression Note    Patient Details  Name: Monica Singh MRN: [id number] Date of Birth: 1928-02-16  [facility]) CM/SW Contact  Margarito Liner, LCSW Phone Number: 11/20/2022, 10:46 AM  Clinical Narrative: [facility] offered a bed. [facility] and [facility] have not responded yet. CSW sent them updated therapy notes and left message for [facility] admissions coordinator to see if there are any updates regarding a potential bed offer.    1:46 pm: CSW met with patient and two daughters, [name] and [name]. They have accepted the bed offer from [facility]. CSW started [place].  Expected Discharge Plan: Skilled Nursing Facility Barriers to Discharge: Continued Medical Work up, English as a second language teacher  Expected Discharge Plan and Services     Post Acute Care Choice: Skilled Nursing Facility Living arrangements for the past 2 months: Assisted Living Facility                                       Social Determinants of Health (SDOH) Interventions SDOH Screenings   Food Insecurity: Patient Unable To Answer (11/18/2022)  Housing: Patient Unable To Answer (11/18/2022)  Transportation Needs: Patient Unable To Answer (11/18/2022)  Utilities: Patient Unable To Answer (11/18/2022)  Alcohol Screen: Low Risk  (08/19/2022)  Depression (PHQ2-9): Low Risk  (08/19/2022)  Financial Resource Strain: Low Risk  (08/19/2022)  Physical Activity: Insufficiently Active (08/19/2022)  Social Connections: Moderately Isolated (08/19/2022)  Stress: No Stress Concern Present (08/19/2022)  Tobacco Use: Medium Risk (11/18/2022)  Health Literacy: Inadequate Health Literacy (08/19/2022)    Readmission Risk Interventions     No data to display

## 2022-11-20 NOTE — Progress Notes (Signed)
PROGRESS NOTE    [place]  XBJ:[id number] DOB: Dec 12, 1928 DOA: 11/16/2022 PCP: Malva Limes, MD  [id number]/[id number]-AA  LOS: 4 days   Brief hospital course:   Assessment & Plan: [place] Monica Singh is a 89 y.o. female with medical history significant of recurrent UTI, left-sided nephroureteral stent in place, HTN, COPD, subclinical hypothyroidism, dementia, hyponatremia, non-Hodgkin lymphoma, adnexal mass, who presents with altered mental status and weakness, fever.   Per her daughter at the bedside, patient is noted to be more confused than her baseline with weakness.   Sepsis: met criteria w/ fever, tachycardia, leukocytosis & UTI & right pyelonephritis.    Complicated UTI & right pyelonephritis: w/ left ureteral stent & hx of carbapenem resistant pseudomonas.  --Chronically pt is on methenamine daily but this is on hold currently  --urine cx pos for multiple species --received 3 days of cefepime f/b Keflex --cont Keflex    Acute metabolic encephalopathy: likely secondary to UTI. Mental status improved. Hx of dementia.    Hypokalemia:  --monitor and supplement PRN   Chronic hyponatremia:  Stable in low 130's.   Normocytic anemia: H&H are stable. No need for a transfusion currently    COPD: w/o exacerbation.  --cont daily bronchodilators   HTN:  holding home amlodipine. Hydralazine prn    Hx non-Hodgkin's lymphoma: f/u outpatient w/ onco    Adnexal mass: known left adnexal cystic mass, CT scan showed slightly increased size since 2020. Follow-up with PCP for outpatient workup   DVT prophylaxis: Lovenox SQ Code Status: DNR  Family Communication: daughter updated at bedside today Level of care: Med-Surg Dispo:   The patient is from: ALF Anticipated d/c is to: SNF rehab Anticipated d/c date is: whenever bed available   Subjective and Interval History:  Pt denied dysuria.   Objective: Vitals:   11/19/22 2007 11/20/22 0502 11/20/22 0502 11/20/22 1641   BP: (!) 172/91 (!) 160/73 (!) 161/73 123/70  Pulse: 94 90 90 98  Resp: 17 18 18    Temp: 98.2 F (36.8 C) 98.2 F (36.8 C) 98.2 F (36.8 C) 98.2 F (36.8 C)  TempSrc:  Oral    SpO2: 95% 95% 95% 95%  Weight:      Height:        Intake/Output Summary (Last 24 hours) at 11/20/2022 2036 Last data filed at 11/20/2022 1030 Gross per 24 hour  Intake 240 ml  Output --  Net 240 ml   Filed Weights   11/16/22 1421  Weight: 65 kg    Examination:   Constitutional: NAD, alert, working with PT HEENT: conjunctivae and lids normal, EOMI CV: No cyanosis.   RESP: normal respiratory effort, on RA Neuro: II - XII grossly intact.     Data Reviewed: I have personally reviewed labs and imaging studies  Time spent: 35 minutes  Darlin Priestly, MD [facility] If 7PM-7AM, please contact night-coverage 11/20/2022, 8:36 PM

## 2022-11-20 NOTE — Plan of Care (Signed)

## 2022-11-21 DIAGNOSIS — N390 Urinary tract infection, site not specified: Secondary | ICD-10-CM | POA: Diagnosis not present

## 2022-11-21 LAB — CULTURE, BLOOD (ROUTINE X 2)
Culture: NO GROWTH
Culture: NO GROWTH
Special Requests: ADEQUATE

## 2022-11-21 NOTE — Care Management Important Message (Signed)
Important Message  Patient Details  Name: Monica Singh MRN: [id number] Date of Birth: 08/12/1928   Important Message Given:  Yes - Medicare IM     Margarito Liner, LCSW 11/21/2022, 10:36 AM

## 2022-11-21 NOTE — Progress Notes (Signed)
Mobility Specialist - Progress Note   11/21/22 1400  Mobility  Activity Refused mobility     Pt politely declined mobility; reporting fatigue and wanting to rest at this time. Will attempt another date. Family at bedside.    Filiberto Pinks Mobility Specialist 11/21/22, 3:00 PM

## 2022-11-21 NOTE — TOC Progression Note (Addendum)
Transition of Care [facility]) - Progression Note    Patient Details  Name: Monica Singh MRN: [id number] Date of Birth: 1928-06-14  Transition of Care [facility]) CM/SW Contact  Margarito Liner, LCSW Phone Number: 11/21/2022, 8:18 AM  Clinical Narrative:  Insurance authorization still pending. [facility] left a voicemail last night asking what barriers would be to her returning to her ALF. CSW sent secure chat to PT/OT team.   10:36 am: CSW updated patient and her two daughters, [name] and [name].  1:14 pm: Auth still pending.  3:39 pm: Insurance is offering a Audiological scientist. CSW sent information to MD in a secure chat.  Expected Discharge Plan: Skilled Nursing Facility Barriers to Discharge: Continued Medical Work up, English as a second language teacher  Expected Discharge Plan and Services     Post Acute Care Choice: Skilled Nursing Facility Living arrangements for the past 2 months: Assisted Living Facility                                       Social Determinants of Health (SDOH) Interventions SDOH Screenings   Food Insecurity: Patient Unable To Answer (11/18/2022)  Housing: Patient Unable To Answer (11/18/2022)  Transportation Needs: Patient Unable To Answer (11/18/2022)  Utilities: Patient Unable To Answer (11/18/2022)  Alcohol Screen: Low Risk  (08/19/2022)  Depression (PHQ2-9): Low Risk  (08/19/2022)  Financial Resource Strain: Low Risk  (08/19/2022)  Physical Activity: Insufficiently Active (08/19/2022)  Social Connections: Moderately Isolated (08/19/2022)  Stress: No Stress Concern Present (08/19/2022)  Tobacco Use: Medium Risk (11/18/2022)  Health Literacy: Inadequate Health Literacy (08/19/2022)    Readmission Risk Interventions     No data to display

## 2022-11-21 NOTE — Progress Notes (Signed)
Occupational Therapy Treatment Patient Details Name: Monica Singh MRN: [id number] DOB: July 12, 1928 Today's Date: 11/21/2022   History of present illness Pt is a 89 y.o. female presenting to hospital 11/16/22 with confusion and urinary incontinence and fever.  Pt admitted with complicated UTI, acute metabolic encephalopathy, hyponatremia.  PMH includes recurrent UTI, L sided nephroureteral stent, htn, COPD, dementia, non-Hodgkin lymphoma, adnexal mass, COPD, CKD, back sx, bil TKA.   OT comments  Pt is seated in recliner on arrival. Easily arousable and agreeable to OT session. She denies pain. Pt performed STS and toilet transfer with CGA, verb cues for hand placement. She ambulated from recliner<>bathroom using RW with CGA, no LOB noted. Pt needing Min A for posterior hygiene after BM. Engaged in extensive ADL session standing at sink and seated on BSC in front of sink for bathing/dressing. Pt levels noted below, but overall needs Min A for UB bathing with cues for initiation and Max to Mod A for LB ADLs with verb cues for initiation. Pt stood at sink with SBA to perform oral hygiene and brush her hair before returning to recliner. Pt was very fatigued following ADL session. She was left with 2 daughters present and with all needs in place and will cont to require skilled acute OT services to maximize her safety and IND to return to PLOF.       If plan is discharge home, recommend the following:  A little help with walking and/or transfers;A little help with bathing/dressing/bathroom;Assistance with cooking/housework;Help with stairs or ramp for entrance;Supervision due to cognitive status   Equipment Recommendations  Other (comment) (defer)    Recommendations for Other Services      Precautions / Restrictions Precautions Precautions: Fall Restrictions Weight Bearing Restrictions: No       Mobility Bed Mobility               General bed mobility comments: NT up in  recliner    Transfers Overall transfer level: Needs assistance Equipment used: Rolling walker (2 wheels) Transfers: Sit to/from Stand Sit to Stand: Contact guard assist           General transfer comment: CGA and extra time to stand from recliner and toilet     Balance Overall balance assessment: Needs assistance Sitting-balance support: Feet supported Sitting balance-Leahy Scale: Good     Standing balance support: Bilateral upper extremity supported, During functional activity, Reliant on assistive device for balance Standing balance-Leahy Scale: Good Standing balance comment: no loss of balance noted during ambulation                           ADL either performed or assessed with clinical judgement   ADL Overall ADL's : Needs assistance/impaired     Grooming: Wash/dry hands;Oral care;Standing;Supervision/safety;Brushing hair Grooming Details (indicate cue type and reason): SBA for standing balance at sink for oral care and hand hygiene and brushing her hair Upper Body Bathing: Sitting;Cueing for sequencing;Minimal assistance Upper Body Bathing Details (indicate cue type and reason): Min A with cueing for intiation Lower Body Bathing: Sit to/from stand;Maximal assistance;Moderate assistance;Cueing for sequencing Lower Body Bathing Details (indicate cue type and reason): Mod to MAX A for LB bathing in standing at sink for buttocks and below the knees; pt able to clean peri-area with SBA; cueing for initiation of task     Lower Body Dressing: Sit to/from stand;Moderate assistance Lower Body Dressing Details (indicate cue type and reason): Min A to don pull  up over her feet and pt able to pull up from her ankles to her thighs, then needed assist in standing to pull over her hips-cognition hindering Toilet Transfer: Contact guard assist;Grab Ship broker- Clothing Manipulation and Hygiene: Sitting/lateral lean;Sit to/from stand;Minimal  assistance Toileting - Clothing Manipulation Details (indicate cue type and reason): Min A for throughness after small BM     Functional mobility during ADLs: Contact guard assist;Rolling walker (2 wheels)      Extremity/Trunk Assessment Upper Extremity Assessment Upper Extremity Assessment: Overall WFL for tasks assessed   Lower Extremity Assessment Lower Extremity Assessment: Generalized weakness        Vision       Perception     Praxis      Cognition Arousal: Alert Behavior During Therapy: WFL for tasks assessed/performed                                   General Comments: Oriented to person and daughters in room and general situation but not time or place (pt typically not oriented to time only)        Exercises      Shoulder Instructions       General Comments pt still remains very confused    Pertinent Vitals/ Pain       Pain Assessment Pain Assessment: No/denies pain Pain Intervention(s): Monitored during session  Home Living                                          Prior Functioning/Environment              Frequency  Min 1X/week        Progress Toward Goals  OT Goals(current goals can now be found in the care plan section)  Progress towards OT goals: Progressing toward goals  Acute Rehab OT Goals Patient Stated Goal: improve strength OT Goal Formulation: With patient/family Time For Goal Achievement: 12/02/22 Potential to Achieve Goals: Good  Plan      Co-evaluation                 AM-PAC OT "6 Clicks" Daily Activity     Outcome Measure   Help from another person eating meals?: None Help from another person taking care of personal grooming?: None Help from another person toileting, which includes using toliet, bedpan, or urinal?: A Little Help from another person bathing (including washing, rinsing, drying)?: A Lot Help from another person to put on and taking off regular upper body  clothing?: A Little Help from another person to put on and taking off regular lower body clothing?: A Lot 6 Click Score: 18    End of Session Equipment Utilized During Treatment: Rolling walker (2 wheels);Gait belt  OT Visit Diagnosis: History of falling (Z91.81);Muscle weakness (generalized) (M62.81);Other abnormalities of gait and mobility (R26.89)   Activity Tolerance Patient tolerated treatment well   Patient Left with family/visitor present;in chair;with chair alarm set;with call bell/phone within reach   Nurse Communication Mobility status        Time: 1610-9604 OT Time Calculation (min): 52 min  Charges: OT General Charges $OT Visit: 1 Visit OT Treatments $Self Care/Home Management : 38-52 mins  Charmion Hapke, OTR/L  11/21/22, 1:22 PM   Evalin Shawhan E Honestii Marton 11/21/2022, 1:18 PM

## 2022-11-21 NOTE — Plan of Care (Signed)

## 2022-11-21 NOTE — Progress Notes (Signed)
PROGRESS NOTE    [place]  UEA:[id number] DOB: 30-May-1928 DOA: 11/16/2022 PCP: Malva Limes, MD  111A/111A-AA  LOS: 5 days   Brief hospital course:   Assessment & Plan: Monica Singh is a 89 y.o. female with medical history significant of recurrent UTI, left-sided nephroureteral stent in place, HTN, COPD, subclinical hypothyroidism, dementia, hyponatremia, non-Hodgkin lymphoma, adnexal mass, who presents with altered mental status and weakness, fever.   Per her daughter at the bedside, patient is noted to be more confused than her baseline with weakness.   Sepsis: met criteria w/ fever, tachycardia, leukocytosis & UTI & right pyelonephritis.    Complicated UTI & right pyelonephritis: w/ left ureteral stent & hx of carbapenem resistant pseudomonas.  --Chronically pt is on methenamine daily but this is on hold currently  --urine cx pos for multiple species --received 3 days of cefepime f/b Keflex --cont Keflex   Acute metabolic encephalopathy:  Baseline dementia Family reported decline in cognition, could be secondary to UTI or disease progression. Mental status improved.    Hypokalemia:  --monitor and supplement PRN   Chronic hyponatremia:  Stable in low 130's.   Normocytic anemia: H&H are stable. No need for a transfusion currently    COPD: w/o exacerbation.  --cont daily bronchodilators   HTN:  --hold home amlodipine   Hx non-Hodgkin's lymphoma: f/u outpatient w/ onco    Adnexal mass: known left adnexal cystic mass, CT scan showed slightly increased size since 2020. Follow-up with PCP for outpatient workup   DVT prophylaxis: Lovenox SQ Code Status: DNR  Family Communication: daughter updated at bedside today Level of care: Med-Surg Dispo:   The patient is from: ALF Anticipated d/c is to: SNF rehab Anticipated d/c date is: whenever bed available   Subjective and Interval History:  Pt had no new complaints.  Good oral  intake.   Objective: Vitals:   11/21/22 0449 11/21/22 0725 11/21/22 1231 11/21/22 1544  BP: (!) 143/67 (!) 147/84 117/61 136/69  Pulse: 86 92 99 90  Resp:  17 19 20   Temp: 97.8 F (36.6 C) 98.1 F (36.7 C) 97.8 F (36.6 C) 98.3 F (36.8 C)  TempSrc:      SpO2: 92% 93% 95% 93%  Weight:      Height:       No intake or output data in the 24 hours ending 11/21/22 2008  Filed Weights   11/16/22 1421  Weight: 65 kg    Examination:   Constitutional: NAD, alert, sitting in recliner HEENT: conjunctivae and lids normal, EOMI CV: No cyanosis.   RESP: normal respiratory effort, on RA Neuro: II - XII grossly intact.     Data Reviewed: I have personally reviewed labs and imaging studies  Time spent: 35 minutes  Darlin Priestly, MD [facility] If 7PM-7AM, please contact night-coverage 11/21/2022, 8:08 PM

## 2022-11-22 DIAGNOSIS — N390 Urinary tract infection, site not specified: Secondary | ICD-10-CM | POA: Diagnosis not present

## 2022-11-22 MED ORDER — ONDANSETRON 4 MG PO TBDP
4.0000 mg | ORAL_TABLET | Freq: Three times a day (TID) | ORAL | Status: DC | PRN
  Administered 2022-11-22: 4 mg via ORAL
  Filled 2022-11-22: qty 1

## 2022-11-22 NOTE — Progress Notes (Signed)
PROGRESS NOTE    [place]  AYT:[id number] DOB: 06-06-1928 DOA: 11/16/2022 PCP: Malva Limes, MD  111A/111A-AA  LOS: 6 days   Brief hospital course:   Assessment & Plan: Monica Singh is a 89 y.o. female with medical history significant of recurrent UTI, left-sided nephroureteral stent in place, HTN, COPD, subclinical hypothyroidism, dementia, hyponatremia, non-Hodgkin lymphoma, adnexal mass, who presents with altered mental status and weakness, fever.   Per her daughter at the bedside, patient is noted to be more confused than her baseline with weakness.   Sepsis: met criteria w/ fever, tachycardia, leukocytosis & UTI & right pyelonephritis.    Complicated UTI & right pyelonephritis: w/ left ureteral stent & hx of carbapenem resistant pseudomonas.  --Chronically pt is on methenamine daily but this is on hold currently  --urine cx pos for multiple species --received 3 days of cefepime f/b Keflex --cont Keflex for a total 10-day course   Acute metabolic encephalopathy:  Baseline dementia Family reported decline in cognition, could be secondary to UTI or disease progression. Mental status improved.    Hypokalemia:  --monitor and supplement PRN   Chronic hyponatremia:  Stable in low 130's. --cont salt tabs   Normocytic anemia: H&H are stable. No need for a transfusion currently    COPD: w/o exacerbation.  --cont daily bronchodilators   HTN:  --hold home amlodipine   Hx non-Hodgkin's lymphoma: f/u outpatient w/ onco    Adnexal mass: known left adnexal cystic mass, CT scan showed slightly increased size since 2020. Follow-up with PCP for outpatient workup   DVT prophylaxis: Lovenox SQ Code Status: DNR  Family Communication: daughter updated at bedside today Level of care: Med-Surg Dispo:   The patient is from: ALF Anticipated d/c is to: to be determined Anticipated d/c date is: whenever safe disposition available   Subjective and Interval  History:  Peer-to-peer with insurance MD done this morning, denial still holds.  Family plans to appeal.  Pt had no new complaints.  Eating very well, per family.   Objective: Vitals:   11/21/22 2043 11/22/22 0433 11/22/22 0736 11/22/22 1542  BP: 139/76 (!) 142/62 (!) 145/80 125/67  Pulse: 93 87 87 92  Resp: 12 20 17 18   Temp: 98.3 F (36.8 C) 98.3 F (36.8 C) 98.1 F (36.7 C) 97.9 F (36.6 C)  TempSrc:    Oral  SpO2: 97% 93% 94% 96%  Weight:      Height:        Intake/Output Summary (Last 24 hours) at 11/22/2022 1759 Last data filed at 11/22/2022 1701 Gross per 24 hour  Intake --  Output 1 ml  Net -1 ml    Filed Weights   11/16/22 1421  Weight: 65 kg    Examination:   Constitutional: NAD, alert HEENT: conjunctivae and lids normal, EOMI CV: No cyanosis.   RESP: normal respiratory effort, on RA Neuro: II - XII grossly intact.     Data Reviewed: I have personally reviewed labs and imaging studies  Time spent: 35 minutes  Darlin Priestly, MD [facility] If 7PM-7AM, please contact night-coverage 11/22/2022, 5:59 PM

## 2022-11-22 NOTE — Progress Notes (Signed)
Physical Therapy Treatment Patient Details Name: Monica Singh MRN: [id number] DOB: 1928/05/01 Today's Date: 11/22/2022   History of Present Illness Pt is a 89 y.o. female presenting to hospital 11/16/22 with confusion and urinary incontinence and fever.  Pt admitted with complicated UTI, acute metabolic encephalopathy, hyponatremia.  PMH includes recurrent UTI, L sided nephroureteral stent, htn, COPD, dementia, non-Hodgkin lymphoma, adnexal mass, COPD, CKD, back sx, bil TKA.    PT Comments  Patient received in bed, daughter [name] at bedside. Patient requires encouragement to participate. Daughter is supportive. Patient requires min A for bed mobility. Cga for transfers and ambulation of 40 feet using RW. She is more limited by fatigue this session, unable to continue with gait. Patient will continue to benefit from skilled PT to improve safety, strength and independence with mobility.      If plan is discharge home, recommend the following: A little help with walking and/or transfers;A little help with bathing/dressing/bathroom;Assistance with cooking/housework;Direct supervision/assist for medications management;Direct supervision/assist for financial management;Assist for transportation;Help with stairs or ramp for entrance   Can travel by private vehicle     No  Equipment Recommendations  Rolling walker (2 wheels)    Recommendations for Other Services       Precautions / Restrictions Precautions Precautions: Fall Restrictions Weight Bearing Restrictions: No     Mobility  Bed Mobility Overal bed mobility: Needs Assistance Bed Mobility: Supine to Sit     Supine to sit: Contact guard, Used rails, HOB elevated          Transfers Overall transfer level: Needs assistance Equipment used: Rolling walker (2 wheels)   Sit to Stand: Contact guard assist           General transfer comment: CGA and extra time to stand from recliner and toilet     Ambulation/Gait Ambulation/Gait assistance: Contact guard assist Gait Distance (Feet): 40 Feet Assistive device: Rolling walker (2 wheels) Gait Pattern/deviations: Step-to pattern, Decreased step length - right, Decreased step length - left, Trunk flexed, Decreased stride length Gait velocity: decreased     General Gait Details: Patient ambulated into bathroom and then to room door. Unable to continue due to fatigue this morning. Leaning forearms on walker. Cues needed to keep walker closer to her   Stairs             Wheelchair Mobility     Tilt Bed    Modified Rankin (Stroke Patients Only)       Balance Overall balance assessment: Needs assistance Sitting-balance support: Feet supported Sitting balance-Leahy Scale: Good     Standing balance support: Bilateral upper extremity supported, During functional activity, Reliant on assistive device for balance Standing balance-Leahy Scale: Fair Standing balance comment: Needed assistance to pull up brief in standing.                            Cognition Arousal: Alert Behavior During Therapy: WFL for tasks assessed/performed Overall Cognitive Status: Within Functional Limits for tasks assessed                                          Exercises      General Comments        Pertinent Vitals/Pain Pain Assessment Pain Assessment: No/denies pain    Home Living  Prior Function            PT Goals (current goals can now be found in the care plan section) Acute Rehab PT Goals Patient Stated Goal: to improve mobility and strength, return to ALF PT Goal Formulation: With patient/family Time For Goal Achievement: 12/02/22 Potential to Achieve Goals: Fair Progress towards PT goals: Not progressing toward goals - comment (increased fatigue today, unable to progress gait distance)    Frequency    Min 1X/week      PT Plan       Co-evaluation              AM-PAC PT "6 Clicks" Mobility   Outcome Measure  Help needed turning from your back to your side while in a flat bed without using bedrails?: A Little Help needed moving from lying on your back to sitting on the side of a flat bed without using bedrails?: A Little Help needed moving to and from a bed to a chair (including a wheelchair)?: A Little Help needed standing up from a chair using your arms (e.g., wheelchair or bedside chair)?: A Little Help needed to walk in hospital room?: A Little Help needed climbing 3-5 steps with a railing? : A Lot 6 Click Score: 17    End of Session Equipment Utilized During Treatment: Gait belt Activity Tolerance: Patient limited by fatigue Patient left: in chair;with call bell/phone within reach;with family/visitor present Nurse Communication: Mobility status PT Visit Diagnosis: Other abnormalities of gait and mobility (R26.89);Muscle weakness (generalized) (M62.81);Difficulty in walking, not elsewhere classified (R26.2)     Time: 1053-1110 PT Time Calculation (min) (ACUTE ONLY): 17 min  Charges:    $Gait Training: 8-22 mins PT General Charges $$ ACUTE PT VISIT: 1 Visit                     Kamya Watling, PT, GCS 11/22/22,11:45 AM

## 2022-11-22 NOTE — TOC Progression Note (Signed)
Transition of Care [facility]) - Progression Note    Patient Details  Name: Monica Singh MRN: [id number] Date of Birth: [birth year]  [facility] CM/SW Contact  Garret Reddish, RN Phone Number: 11/22/2022, 4:52 PM  Clinical Narrative:   Informed by Dr. Fran Lowes that peer to peer was denied.    I have informed patient's daughter  Monica Singh that patient has been denied SNF stay and that provider has completed a peer to peer which has also been denied.  I have given Monica Singh information to complete an appeal  The number to call for the appeal is [phone].    TOC will continue to follow for discharge planning.     Expected Discharge Plan: Skilled Nursing Facility Barriers to Discharge: Continued Medical Work up, English as a second language teacher  Expected Discharge Plan and Services     Post Acute Care Choice: Skilled Nursing Facility Living arrangements for the past 2 months: Assisted Living Facility                                       Social Determinants of Health (SDOH) Interventions SDOH Screenings   Food Insecurity: Patient Unable To Answer (11/18/2022)  Housing: Patient Unable To Answer (11/18/2022)  Transportation Needs: Patient Unable To Answer (11/18/2022)  Utilities: Patient Unable To Answer (11/18/2022)  Alcohol Screen: Low Risk  (08/19/2022)  Depression (PHQ2-9): Low Risk  (08/19/2022)  Financial Resource Strain: Low Risk  (08/19/2022)  Physical Activity: Insufficiently Active (08/19/2022)  Social Connections: Moderately Isolated (08/19/2022)  Stress: No Stress Concern Present (08/19/2022)  Tobacco Use: Medium Risk (11/18/2022)  Health Literacy: Inadequate Health Literacy (08/19/2022)    Readmission Risk Interventions     No data to display

## 2022-11-22 NOTE — Plan of Care (Signed)

## 2022-11-23 DIAGNOSIS — N390 Urinary tract infection, site not specified: Secondary | ICD-10-CM | POA: Diagnosis not present

## 2022-11-23 NOTE — Progress Notes (Signed)
PROGRESS NOTE    [place]  ZOX:[id number] DOB: Sep 03, 1928 DOA: 11/16/2022 PCP: Malva Limes, MD  111A/111A-AA  LOS: 7 days   Brief hospital course:   Assessment & Plan: Monica Singh is a 89 y.o. female with medical history significant of recurrent UTI, left-sided nephroureteral stent in place, HTN, COPD, subclinical hypothyroidism, dementia, hyponatremia, non-Hodgkin lymphoma, adnexal mass, who presents with altered mental status and weakness, fever.   Per her daughter at the bedside, patient is noted to be more confused than her baseline with weakness.   Sepsis: met criteria w/ fever, tachycardia, leukocytosis & UTI & right pyelonephritis.    Complicated UTI & right pyelonephritis: w/ left ureteral stent & hx of carbapenem resistant pseudomonas.  --Chronically pt is on methenamine daily but this is on hold currently  --urine cx pos for multiple species --received 3 days of cefepime f/b Keflex --cont Keflex for total 14-day course   Acute metabolic encephalopathy:  Baseline dementia Family reported decline in cognition, could be secondary to UTI or disease progression. Mental status improved.    Hypokalemia:  --monitor and supplement PRN   Chronic hyponatremia:  Stable in low 130's. --salt tabs started during this hospitalization, which pt doesn't tolerate well. --d/c salt tabs --monitor Na intermittently   Normocytic anemia: H&H are stable. No need for a transfusion currently    COPD: w/o exacerbation.  --cont daily bronchodilators   HTN:  --BP wnl --hold home amlodipine   Hx non-Hodgkin's lymphoma: f/u outpatient w/ onco    Adnexal mass: known left adnexal cystic mass, CT scan showed slightly increased size since 2020. Follow-up with PCP for outpatient workup   DVT prophylaxis: Lovenox SQ Code Status: DNR  Family Communication: daughters updated at bedside today Level of care: Med-Surg Dispo:   The patient is from: ALF Anticipated d/c  is to: to be determined.  Family started appeal for SNF rehab Anticipated d/c date is: whenever safe disposition available   Subjective and Interval History:  Pt worked with PT this morning.  Family has started the appeal for SNF rehab.   Objective: Vitals:   11/23/22 0451 11/23/22 0739 11/23/22 1139 11/23/22 1625  BP: 126/70 128/63 108/67 115/68  Pulse: 94 83 90 87  Resp: 16 16 16 16   Temp: 98.2 F (36.8 C) 97.8 F (36.6 C)  (!) 97.4 F (36.3 C)  TempSrc: Oral     SpO2: 92% 93% 96% 93%  Weight:      Height:        Intake/Output Summary (Last 24 hours) at 11/23/2022 1750 Last data filed at 11/22/2022 1800 Gross per 24 hour  Intake --  Output 1 ml  Net -1 ml    Filed Weights   11/16/22 1421  Weight: 65 kg    Examination:   Constitutional: NAD, alert, oriented to person and place, sitting in recliner HEENT: conjunctivae and lids normal, EOMI CV: No cyanosis.   RESP: normal respiratory effort, on RA Neuro: II - XII grossly intact.   Psych: Normal mood and affect.     Data Reviewed: I have personally reviewed labs and imaging studies  Time spent: 35 minutes  Darlin Priestly, MD [facility] If 7PM-7AM, please contact night-coverage 11/23/2022, 5:50 PM

## 2022-11-23 NOTE — Progress Notes (Signed)
Physical Therapy Treatment Patient Details Name: Monica Singh MRN: [id number] DOB: 09-16-1928 Today's Date: 11/23/2022   History of Present Illness Pt is a 89 yo female presenting to hospital on 11/16/22 with confusion, urinary incontinence, and fever. She was admitted with complication from UTI, acute metabolic encephalopathy, and hyponatremia. PMH includes L sided nephroureteral stent, HTN, COPD, dementia, non-Hodgkin lymphoma, ADENEXAL MASS, COPD, CKD, and bilateral TKA.    PT Comments  Pt presents seated in bed with daughter present and agreeable to PT. Pt continues to progress towards rehab goals with ability to transfer and ambulate with limited assistance. Pt requiring mod VC and TC to maintain proximity to walker while ambulating. Pt is nearing functional baseline and she will continue to benefit from skilled PT to return to prior level of function to return to ALF safely and decrease risk of falling.     If plan is discharge home, recommend the following: A little help with bathing/dressing/bathroom;A little help with walking and/or transfers;Assistance with cooking/housework;Assist for transportation;Supervision due to cognitive status   Can travel by private vehicle     No  Equipment Recommendations       Recommendations for Other Services       Precautions / Restrictions Restrictions Weight Bearing Restrictions: No     Mobility  Bed Mobility Overal bed mobility: Modified Independent Bed Mobility: Supine to Sit     Supine to sit: Modified independent (Device/Increase time)          Transfers Overall transfer level: Needs assistance   Transfers: Sit to/from Stand Sit to Stand: Contact guard assist                Ambulation/Gait Ambulation/Gait assistance: Contact guard assist Gait Distance (Feet): 200 Feet Assistive device: Rolling walker (2 wheels) Gait Pattern/deviations: Decreased stride length, Trunk flexed Gait velocity: Slow Gait          Stairs             Wheelchair Mobility     Tilt Bed    Modified Rankin (Stroke Patients Only)       Balance Overall balance assessment: Needs assistance Sitting-balance support: No upper extremity supported Sitting balance-Leahy Scale: Normal     Standing balance support: Bilateral upper extremity supported Standing balance-Leahy Scale: Good                              Cognition Arousal: Alert   Overall Cognitive Status: History of cognitive impairments - at baseline                                          Exercises      General Comments        Pertinent Vitals/Pain Pain Assessment Pain Assessment: No/denies pain    Home Living                          Prior Function            PT Goals (current goals can now be found in the care plan section) Acute Rehab PT Goals Patient Stated Goal: Per daughter, pt would like mother to return to ALF and be safe enough to return PT Goal Formulation: With patient/family Time For Goal Achievement: 12/07/22    Frequency    Min 3X/week  PT Plan      Co-evaluation              AM-PAC PT "6 Clicks" Mobility   Outcome Measure  Help needed turning from your back to your side while in a flat bed without using bedrails?: None Help needed moving from lying on your back to sitting on the side of a flat bed without using bedrails?: None Help needed moving to and from a bed to a chair (including a wheelchair)?: A Little Help needed standing up from a chair using your arms (e.g., wheelchair or bedside chair)?: A Little Help needed to walk in hospital room?: A Little Help needed climbing 3-5 steps with a railing? : A Lot 6 Click Score: 19    End of Session Equipment Utilized During Treatment: Gait belt Activity Tolerance: Patient tolerated treatment well Patient left: in chair   PT Visit Diagnosis: Unsteadiness on feet (R26.81);History of falling  (Z91.81)     Time: 1105-1130 PT Time Calculation (min) (ACUTE ONLY): 25 min  Charges:    $Therapeutic Activity: 23-37 mins PT General Charges $$ ACUTE PT VISIT: 1 Visit                     Ellin Goodie PT, DPT  [facility] [address], [place], [address] Phone: [phone]   Fax:  [phone]

## 2022-11-24 DIAGNOSIS — N390 Urinary tract infection, site not specified: Secondary | ICD-10-CM | POA: Diagnosis not present

## 2022-11-24 MED ORDER — CEPHALEXIN 500 MG PO CAPS
500.0000 mg | ORAL_CAPSULE | Freq: Four times a day (QID) | ORAL | Status: DC
  Administered 2022-11-24 – 2022-11-27 (×11): 500 mg via ORAL
  Filled 2022-11-24 (×10): qty 1

## 2022-11-24 MED ORDER — CEPHALEXIN 500 MG PO CAPS: 500.0000 mg | ORAL_CAPSULE | Freq: Three times a day (TID) | ORAL | Status: DC

## 2022-11-24 NOTE — TOC Progression Note (Signed)
Transition of Care [facility]) - Progression Note    Patient Details  Name: Monica Singh MRN: [id number] Date of Birth: 30-Mar-1928  [facility]) CM/SW Contact  Bing Quarry, RN Phone Number: 11/24/2022, 11:06 AM  Clinical Narrative:  11/24/22: Per [employer] portal insurance authorization appeal for [facility] Medicare remains pending at 1100 am 10/27: Plan auth ID: [id number]. AUTH ID: [id number]. TOC to continue to follow.   Gabriel Cirri MSN RN CM  Care Management Department.    [facility] Direct Dial: [phone] Main Office Phone: [phone] Weekends Only      Expected Discharge Plan: Skilled Nursing Facility Barriers to Discharge: Continued Medical Work up, English as a second language teacher  Expected Discharge Plan and Services     Post Acute Care Choice: Skilled Nursing Facility Living arrangements for the past 2 months: Assisted Living Facility                                       Social Determinants of Health (SDOH) Interventions SDOH Screenings   Food Insecurity: Patient Unable To Answer (11/18/2022)  Housing: Patient Unable To Answer (11/18/2022)  Transportation Needs: Patient Unable To Answer (11/18/2022)  Utilities: Patient Unable To Answer (11/18/2022)  Alcohol Screen: Low Risk  (08/19/2022)  Depression (PHQ2-9): Low Risk  (08/19/2022)  Financial Resource Strain: Low Risk  (08/19/2022)  Physical Activity: Insufficiently Active (08/19/2022)  Social Connections: Moderately Isolated (08/19/2022)  Stress: No Stress Concern Present (08/19/2022)  Tobacco Use: Medium Risk (11/18/2022)  Health Literacy: Inadequate Health Literacy (08/19/2022)    Readmission Risk Interventions     No data to display

## 2022-11-24 NOTE — Plan of Care (Signed)
Progressing well.

## 2022-11-24 NOTE — Plan of Care (Signed)

## 2022-11-24 NOTE — Progress Notes (Signed)
PROGRESS NOTE    [place]  ZOX:[id number] DOB: 05/16/1928 DOA: 11/16/2022 PCP: Malva Limes, MD  111A/111A-AA  LOS: 8 days   Brief hospital course:   Assessment & Plan: Monica Singh is a 89 y.o. female with medical history significant of recurrent UTI, left-sided nephroureteral stent in place, HTN, COPD, subclinical hypothyroidism, dementia, hyponatremia, non-Hodgkin lymphoma, adnexal mass, who presents with altered mental status and weakness, fever.   Per her daughter at the bedside, patient is noted to be more confused than her baseline with weakness.   Sepsis: met criteria w/ fever, tachycardia, leukocytosis & UTI & right pyelonephritis.    Complicated UTI & right pyelonephritis: w/ left ureteral stent & hx of carbapenem resistant pseudomonas.  --Chronically pt is on methenamine daily but this is on hold currently  --urine cx pos for multiple species --received 3 days of cefepime f/b Keflex --cont Keflex for 14 days   Acute metabolic encephalopathy:  Baseline dementia Family reported decline in cognition, could be secondary to UTI or disease progression. Mental status improved.    Hypokalemia:  --monitor and supplement PRN   Chronic hyponatremia:  Stable in low 130's. --salt tabs started during this hospitalization, which pt doesn't tolerate well.  Salt tabs d/c'ed. --monitor Na intermittently   Normocytic anemia: H&H are stable. No need for a transfusion currently    COPD: w/o exacerbation.  --cont daily bronchodilators   HTN:  --BP wnl --hold home amlodipine   Hx non-Hodgkin's lymphoma: f/u outpatient w/ onco    Adnexal mass: known left adnexal cystic mass, CT scan showed slightly increased size since 2020. Follow-up with PCP for outpatient workup   DVT prophylaxis: Lovenox SQ Code Status: DNR  Family Communication: daughter and son updated at bedside today Level of care: Med-Surg Dispo:   The patient is from: ALF Anticipated d/c is  to: to be determined.  Family started appeal for SNF rehab Anticipated d/c date is: whenever safe disposition available   Subjective and Interval History:  Daughter reported pt's urine more cloudy.  Reportedly pt doesn't drink enough fluids.     Objective: Vitals:   11/23/22 1625 11/23/22 1942 11/24/22 0446 11/24/22 0801  BP: 115/68 120/60 134/67 (!) 145/64  Pulse: 87 100 82 90  Resp: 16 18 16 16   Temp: (!) 97.4 F (36.3 C) 98.3 F (36.8 C) 98 F (36.7 C) 98 F (36.7 C)  TempSrc:  Oral Oral   SpO2: 93% 96% 97% 96%  Weight:      Height:       No intake or output data in the 24 hours ending 11/24/22 1523   Filed Weights   11/16/22 1421  Weight: 65 kg    Examination:   Constitutional: NAD, alert, sitting in recliner HEENT: conjunctivae and lids normal, EOMI CV: No cyanosis.   RESP: normal respiratory effort, on RA Neuro: II - XII grossly intact.   Psych: Normal mood and affect.     Data Reviewed: I have personally reviewed labs and imaging studies  Time spent: 35 minutes  Darlin Priestly, MD [facility] If 7PM-7AM, please contact night-coverage 11/24/2022, 3:23 PM

## 2022-11-24 NOTE — Plan of Care (Signed)
  Problem: Education: Goal: Knowledge of General Education information will improve Description: Including pain rating scale, medication(s)/side effects and non-pharmacologic comfort measures Outcome: Progressing   Problem: Health Behavior/Discharge Planning: Goal: Ability to manage health-related needs will improve Outcome: Progressing   Problem: Clinical Measurements: Goal: Ability to maintain clinical measurements within normal limits will improve Outcome: Progressing   Problem: Clinical Measurements: Goal: Will remain free from infection Outcome: Progressing   Problem: Clinical Measurements: Goal: Respiratory complications will improve Outcome: Progressing   Problem: Clinical Measurements: Goal: Cardiovascular complication will be avoided Outcome: Progressing   Problem: Nutrition: Goal: Adequate nutrition will be maintained Outcome: Progressing

## 2022-11-25 ENCOUNTER — Ambulatory Visit: Payer: Medicare Other | Admitting: Dermatology

## 2022-11-25 DIAGNOSIS — N390 Urinary tract infection, site not specified: Secondary | ICD-10-CM | POA: Diagnosis not present

## 2022-11-25 LAB — CBC
HCT: 35 % — ABNORMAL LOW (ref 36.0–46.0)
Hemoglobin: 11.7 g/dL — ABNORMAL LOW (ref 12.0–15.0)
MCH: 28.7 pg (ref 26.0–34.0)
MCHC: 33.4 g/dL (ref 30.0–36.0)
MCV: 85.8 fL (ref 80.0–100.0)
Platelets: 425 10*3/uL — ABNORMAL HIGH (ref 150–400)
RBC: 4.08 MIL/uL (ref 3.87–5.11)
RDW: 13.4 % (ref 11.5–15.5)
WBC: 8.4 10*3/uL (ref 4.0–10.5)
nRBC: 0 % (ref 0.0–0.2)

## 2022-11-25 LAB — BASIC METABOLIC PANEL
Anion gap: 8 (ref 5–15)
BUN: 17 mg/dL (ref 8–23)
CO2: 26 mmol/L (ref 22–32)
Calcium: 8.8 mg/dL — ABNORMAL LOW (ref 8.9–10.3)
Chloride: 98 mmol/L (ref 98–111)
Creatinine, Ser: 0.88 mg/dL (ref 0.44–1.00)
GFR, Estimated: >60 mL/min (ref >60)
Glucose, Bld: 99 mg/dL (ref 70–99)
Potassium: 3.6 mmol/L (ref 3.5–5.1)
Sodium: 132 mmol/L — ABNORMAL LOW (ref 135–145)

## 2022-11-25 LAB — MAGNESIUM: Magnesium: 2.2 mg/dL (ref 1.7–2.4)

## 2022-11-25 MED ORDER — FLUCONAZOLE 50 MG PO TABS
50.0000 mg | ORAL_TABLET | Freq: Every day | ORAL | Status: AC
  Administered 2022-11-25 – 2022-11-27 (×3): 50 mg via ORAL
  Filled 2022-11-25 (×3): qty 1

## 2022-11-25 NOTE — TOC Progression Note (Signed)
Transition of Care [facility]) - Progression Note    Patient Details  Name: Monica Singh MRN: [id number] Date of Birth: [birth year]  [facility]) CM/SW Contact  Allena Katz, LCSW Phone Number: 11/25/2022, 3:10 PM  Clinical Narrative:  Clinicals faxed to [facility] at [phone] as requested.     Expected Discharge Plan: Skilled Nursing Facility Barriers to Discharge: Continued Medical Work up, English as a second language teacher  Expected Discharge Plan and Services     Post Acute Care Choice: Skilled Nursing Facility Living arrangements for the past 2 months: Assisted Living Facility                                       Social Determinants of Health (SDOH) Interventions SDOH Screenings   Food Insecurity: Patient Unable To Answer (11/18/2022)  Housing: Patient Unable To Answer (11/18/2022)  Transportation Needs: Patient Unable To Answer (11/18/2022)  Utilities: Patient Unable To Answer (11/18/2022)  Alcohol Screen: Low Risk  (08/19/2022)  Depression (PHQ2-9): Low Risk  (08/19/2022)  Financial Resource Strain: Low Risk  (08/19/2022)  Physical Activity: Insufficiently Active (08/19/2022)  Social Connections: Moderately Isolated (08/19/2022)  Stress: No Stress Concern Present (08/19/2022)  Tobacco Use: Medium Risk (11/18/2022)  Health Literacy: Inadequate Health Literacy (08/19/2022)    Readmission Risk Interventions     No data to display

## 2022-11-25 NOTE — Progress Notes (Signed)
PROGRESS NOTE    [place]  [id number] DOB: August 01, 1928 DOA: 11/16/2022 PCP: Malva Limes, MD  111A/111A-AA  LOS: 9 days   Brief hospital course:   Assessment & Plan: Monica Singh is a 89 y.o. female with medical history significant of recurrent UTI, left-sided nephroureteral stent in place, HTN, COPD, subclinical hypothyroidism, dementia, hyponatremia, non-Hodgkin lymphoma, adnexal mass, who presents with altered mental status and weakness, fever.   Per her daughter at the bedside, patient is noted to be more confused than her baseline with weakness.   Sepsis: met criteria w/ fever, tachycardia, leukocytosis & UTI & right pyelonephritis.    Complicated UTI & right pyelonephritis: w/ left ureteral stent & hx of carbapenem resistant pseudomonas.  --Chronically pt is on methenamine daily but this is on hold currently  --urine cx pos for multiple species --received 3 days of cefepime f/b Keflex --cont Keflex for 14 days   Acute metabolic encephalopathy:  Baseline dementia Family reported decline in cognition, could be secondary to UTI or disease progression. Mental status improved.    Hypokalemia:  --monitor intermittently   Chronic hyponatremia:  Stable in low 130's. --salt tabs started during this hospitalization, which pt doesn't tolerate well.  Salt tabs d/c'ed. --monitor intermittently   Normocytic anemia: H&H are stable. No need for a transfusion currently    COPD: w/o exacerbation.  --cont daily bronchodilators   HTN:  --BP wnl --hold home amlodipine   Hx non-Hodgkin's lymphoma: f/u outpatient w/ onco    Adnexal mass: known left adnexal cystic mass, CT scan showed slightly increased size since 2020. Follow-up with PCP for outpatient workup  Possible vaginal yeast infection --start diflucan today for 3 doses   DVT prophylaxis: Lovenox SQ Code Status: DNR  Family Communication: daughters updated at bedside today Level of care:  Med-Surg Dispo:   The patient is from: ALF Anticipated d/c is to: to be determined.  Family started appeal for SNF rehab Anticipated d/c date is: whenever safe disposition available   Subjective and Interval History:  Daughter reported pt was found to be scratching her crotch, asked for fluconazole.     Objective: Vitals:   11/24/22 2014 11/25/22 0502 11/25/22 0809 11/25/22 1528  BP: 136/70 121/67 (!) 129/56 (!) 140/58  Pulse: 93 86 85 (!) 107  Resp: 16 15 17 19   Temp: 98.2 F (36.8 C) 97.9 F (36.6 C) 97.8 F (36.6 C) 97.8 F (36.6 C)  TempSrc: Oral Oral Oral Oral  SpO2: 94% 94% 96% 98%  Weight:      Height:        Intake/Output Summary (Last 24 hours) at 11/25/2022 1834 Last data filed at 11/25/2022 1023 Gross per 24 hour  Intake 240 ml  Output --  Net 240 ml     Filed Weights   11/16/22 1421  Weight: 65 kg    Examination:   Constitutional: NAD, alert HEENT: conjunctivae and lids normal, EOMI CV: No cyanosis.   RESP: normal respiratory effort, on RA Neuro: II - XII grossly intact.     Data Reviewed: I have personally reviewed labs and imaging studies  Time spent: 35 minutes  Darlin Priestly, MD [facility] If 7PM-7AM, please contact night-coverage 11/25/2022, 6:34 PM

## 2022-11-25 NOTE — Progress Notes (Addendum)
Mobility Specialist - Progress Note   11/25/22 1200  Mobility  Activity Ambulated with assistance in hallway;Ambulated with assistance to bathroom;Transferred from bed to chair  Level of Assistance Standby assist, set-up cues, supervision of patient - no hands on  Assistive Device Front wheel walker  Distance Ambulated (ft) 200 ft  Activity Response Tolerated well  $Mobility charge 1 Mobility     Pt lying in bed upon arrival, utilizing RA. Pt agreeable to session. Completed bed mobility modI. STS and ambulation with CGA + light assist for RW negotiation to avoid obstacles. No LOB. VC to stay close/inside RW. Fatigued with activity. Pt left in chair with alarm set, needs in reach. Family at bedside.    Filiberto Pinks Mobility Specialist 11/25/22, 12:35 PM

## 2022-11-25 NOTE — Progress Notes (Addendum)
Physical Therapy Treatment Patient Details Name: Monica Singh MRN: [id number] DOB: 05/09/1928 Today's Date: 11/25/2022   History of Present Illness Pt is a 89 yo female presenting to hospital on 11/16/22 with confusion, urinary incontinence, and fever. She was admitted with complication from UTI, acute metabolic encephalopathy, and hyponatremia. PMH includes L sided nephroureteral stent, HTN, COPD, dementia, non-Hodgkin lymphoma, ADENEXAL MASS, COPD, CKD, and bilateral TKA.    PT Comments  Pt alert to person, but did not ask orientation questions. Pt denies pain and tolerates therapy session well. CGA for STS and amb 200' with RW, constant verbal cuing required to stay within RW. Pt stopped for one standing rest break, reporting "it felt like my legs were going to give out," but no LOB/buckling noted. Pt tolerated seated therex exercises and educated on importance of performing them throughout the day for strength and ROM benefits. Pt would continue to benefit from skilled therapy to progress toward mobility goals.    If plan is discharge home, recommend the following: A little help with bathing/dressing/bathroom;A little help with walking and/or transfers;Assistance with cooking/housework;Assist for transportation;Supervision due to cognitive status   Can travel by private vehicle     Yes  Equipment Recommendations  Rolling walker (2 wheels)    Recommendations for Other Services       Precautions / Restrictions Precautions Precautions: Fall Restrictions Weight Bearing Restrictions: No     Mobility  Bed Mobility               General bed mobility comments: not tested; in recliner upon entry    Transfers Overall transfer level: Needs assistance Equipment used: Rolling walker (2 wheels) Transfers: Sit to/from Stand Sit to Stand: Contact guard assist                Ambulation/Gait Ambulation/Gait assistance: Contact guard assist Gait Distance (Feet): 200  Feet Assistive device: Rolling walker (2 wheels) Gait Pattern/deviations: Decreased stride length, Trunk flexed Gait velocity: decreased     General Gait Details: Pt ambulated with CGA in the hallway; constant verbal cuing to stay within RW; one standing rest break required, pt reported "it felt like my legs were going to give out," but no LOB/buckling noted.   Stairs             Wheelchair Mobility     Tilt Bed    Modified Rankin (Stroke Patients Only)       Balance Overall balance assessment: Needs assistance Sitting-balance support: No upper extremity supported, Feet supported Sitting balance-Leahy Scale: Normal Sitting balance - Comments: steady with seated exercises   Standing balance support: Bilateral upper extremity supported Standing balance-Leahy Scale: Good                              Cognition Arousal: Alert Behavior During Therapy: WFL for tasks assessed/performed                                   General Comments: Oriented to person, other questions not asked        Exercises General Exercises - Lower Extremity Ankle Circles/Pumps: AROM, Strengthening, Both, 10 reps, Seated Long Arc Quad: Seated, Both, 10 reps, AROM, Strengthening Hip Flexion/Marching: AROM, Strengthening, Both, 10 reps, Seated    General Comments        Pertinent Vitals/Pain      Home Living  Prior Function            PT Goals (current goals can now be found in the care plan section) Acute Rehab PT Goals Patient Stated Goal: Per daughter, pt would like mother to return to ALF and be safe enough to return PT Goal Formulation: With patient/family Time For Goal Achievement: 12/07/22 Potential to Achieve Goals: Fair Progress towards PT goals: Progressing toward goals    Frequency    Min 1X/weekOf note pt frequency updated per pt-centered delivery of care model.      PT Plan      Co-evaluation               AM-PAC PT "6 Clicks" Mobility   Outcome Measure  Help needed turning from your back to your side while in a flat bed without using bedrails?: None Help needed moving from lying on your back to sitting on the side of a flat bed without using bedrails?: None   Help needed standing up from a chair using your arms (e.g., wheelchair or bedside chair)?: A Little Help needed to walk in hospital room?: A Little Help needed climbing 3-5 steps with a railing? : A Lot 6 Click Score: 16    End of Session Equipment Utilized During Treatment: Gait belt Activity Tolerance: Patient tolerated treatment well Patient left: in chair;with family/visitor present;with call bell/phone within reach (nursing aware chair alarm not set) Nurse Communication: Mobility status PT Visit Diagnosis: Unsteadiness on feet (R26.81);History of falling (Z91.81)     Time: 2841-3244 PT Time Calculation (min) (ACUTE ONLY): 13 min  Charges:    $Therapeutic Activity: 8-22 mins PT General Charges $$ ACUTE PT VISIT: 1 Visit                       Shauna Hugh, SPT 11/25/2022, 2:35 PM

## 2022-11-26 DIAGNOSIS — N390 Urinary tract infection, site not specified: Secondary | ICD-10-CM | POA: Diagnosis not present

## 2022-11-26 LAB — CBC
HCT: 34.5 % — ABNORMAL LOW (ref 36.0–46.0)
Hemoglobin: 11.5 g/dL — ABNORMAL LOW (ref 12.0–15.0)
MCH: 28.8 pg (ref 26.0–34.0)
MCHC: 33.3 g/dL (ref 30.0–36.0)
MCV: 86.5 fL (ref 80.0–100.0)
Platelets: 423 10*3/uL — ABNORMAL HIGH (ref 150–400)
RBC: 3.99 MIL/uL (ref 3.87–5.11)
RDW: 13.5 % (ref 11.5–15.5)
WBC: 7.7 10*3/uL (ref 4.0–10.5)
nRBC: 0 % (ref 0.0–0.2)

## 2022-11-26 LAB — BASIC METABOLIC PANEL
Anion gap: 8 (ref 5–15)
BUN: 21 mg/dL (ref 8–23)
CO2: 24 mmol/L (ref 22–32)
Calcium: 8.6 mg/dL — ABNORMAL LOW (ref 8.9–10.3)
Chloride: 98 mmol/L (ref 98–111)
Creatinine, Ser: 0.81 mg/dL (ref 0.44–1.00)
GFR, Estimated: >60 mL/min (ref >60)
Glucose, Bld: 102 mg/dL — ABNORMAL HIGH (ref 70–99)
Potassium: 3.6 mmol/L (ref 3.5–5.1)
Sodium: 130 mmol/L — ABNORMAL LOW (ref 135–145)

## 2022-11-26 LAB — MAGNESIUM: Magnesium: 1.9 mg/dL (ref 1.7–2.4)

## 2022-11-26 NOTE — Progress Notes (Signed)
PROGRESS NOTE    [place]  WNU:[id number] DOB: 1928-08-11 DOA: 11/16/2022 PCP: Malva Limes, MD  111A/111A-AA  LOS: 10 days   Brief hospital course:   Assessment & Plan: Monica Singh is a 89 y.o. female with medical history significant of recurrent UTI, left-sided nephroureteral stent in place, HTN, COPD, subclinical hypothyroidism, dementia, hyponatremia, non-Hodgkin lymphoma, adnexal mass, who presented with altered mental status and weakness, fever.   Per her daughter at the bedside, patient was noted to be more confused than her baseline with weakness.   Sepsis: met criteria w/ fever, tachycardia, leukocytosis & UTI & right pyelonephritis.    Complicated UTI & right pyelonephritis:  w/ left ureteral stent & hx of carbapenem resistant pseudomonas.  --Chronically pt is on methenamine daily but this is on hold currently  --urine cx pos for multiple species --received 3 days of cefepime f/b Keflex --cont Keflex for 14 days   Acute metabolic encephalopathy:  Baseline dementia Family reported decline in cognition, could be secondary to UTI or disease progression. Mental status improved.    Hypokalemia:  --monitor intermittently   Chronic hyponatremia:  Stable in low 130's. --salt tabs started during this hospitalization, which pt doesn't tolerate well.  Salt tabs d/c'ed. --monitor intermittently   Normocytic anemia: H&H are stable. No need for a transfusion currently    COPD: w/o exacerbation.  --cont daily bronchodilators   HTN:  --BP wnl --hold home amlodipine   Hx non-Hodgkin's lymphoma: f/u outpatient w/ onco    Adnexal mass: known left adnexal cystic mass, CT scan showed slightly increased size since 2020. Follow-up with PCP for outpatient workup  Possible vaginal yeast infection --cont diflucan for 3 doses   DVT prophylaxis: Lovenox SQ Code Status: DNR  Family Communication: daughters updated at bedside today Level of care:  Med-Surg Dispo:   The patient is from: ALF Anticipated d/c is to: SNF rehab.  Appeal to insurance approved today. Anticipated d/c date is: whenever bed available, possible tomorrow.   Subjective and Interval History:  Daughter reported vaginal itching seemed to have improved.  Family's appeal to insurance was approved today.   Objective: Vitals:   11/26/22 0507 11/26/22 0741 11/26/22 1545 11/26/22 1944  BP: 129/70 129/67 113/62 116/65  Pulse: 89 86 90 88  Resp:  17 17   Temp: 98 F (36.7 C) 97.7 F (36.5 C) 98.2 F (36.8 C) 98 F (36.7 C)  TempSrc: Oral     SpO2: 94% 96% 98% 93%  Weight:      Height:        Intake/Output Summary (Last 24 hours) at 11/26/2022 2305 Last data filed at 11/26/2022 1428 Gross per 24 hour  Intake 480 ml  Output --  Net 480 ml     Filed Weights   11/16/22 1421  Weight: 65 kg    Examination:   Constitutional: NAD, alert HEENT: conjunctivae and lids normal, EOMI CV: No cyanosis.   RESP: normal respiratory effort, on RA Neuro: II - XII grossly intact.     Data Reviewed: I have personally reviewed labs and imaging studies  Time spent: 25 minutes  Darlin Priestly, MD [facility] If 7PM-7AM, please contact night-coverage 11/26/2022, 11:05 PM

## 2022-11-26 NOTE — TOC Transition Note (Signed)
Transition of Care [facility]) - CM/SW Discharge Note   Patient Details  Name: Monica Singh MRN: [id number] Date of Birth: 07-08-1928  Transition of Care [facility]) CM/SW Contact:  Allena Katz, LCSW Phone Number: 11/26/2022, 2:16 PM   Clinical Narrative:   Appeal for rehab overturned. CSW informed Tiffanie with [facility]. Tiffanie to follow up on when they will have a bed. Daughter [name] notified.        Barriers to Discharge: Continued Medical Work up, English as a second language teacher   Patient Goals and CMS Choice CMS Medicare.gov Compare Post Acute Care list provided to:: Patient (Daughter at bedside)    Discharge Placement                         Discharge Plan and Services Additional resources added to the After Visit Summary for       Post Acute Care Choice: Skilled Nursing Facility                               Social Determinants of Health (SDOH) Interventions SDOH Screenings   Food Insecurity: Patient Unable To Answer (11/18/2022)  Housing: Patient Unable To Answer (11/18/2022)  Transportation Needs: Patient Unable To Answer (11/18/2022)  Utilities: Patient Unable To Answer (11/18/2022)  Alcohol Screen: Low Risk  (08/19/2022)  Depression (PHQ2-9): Low Risk  (08/19/2022)  Financial Resource Strain: Low Risk  (08/19/2022)  Physical Activity: Insufficiently Active (08/19/2022)  Social Connections: Moderately Isolated (08/19/2022)  Stress: No Stress Concern Present (08/19/2022)  Tobacco Use: Medium Risk (11/18/2022)  Health Literacy: Inadequate Health Literacy (08/19/2022)     Readmission Risk Interventions     No data to display

## 2022-11-26 NOTE — Progress Notes (Signed)
Occupational Therapy Treatment Patient Details Name: Monica Singh MRN: [id number] DOB: 12-01-1928 Today's Date: 11/26/2022   History of present illness Pt is a 89 yo female presenting to hospital on 11/16/22 with confusion, urinary incontinence, and fever. She was admitted with complication from UTI, acute metabolic encephalopathy, and hyponatremia. PMH includes L sided nephroureteral stent, HTN, COPD, dementia, non-Hodgkin lymphoma, ADENEXAL MASS, COPD, CKD, and bilateral TKA.   OT comments  Pt is supine in bed on arrival. Easily arousable and needing increased encouragement to participate in OT session. She denies pain. Pt performed bed mobility with Min A, STS to RW with CGA, extra time. Engaged in ADL session for bathing/dressing in bathroom standing at sink for majority of session and continues to require assist for ADL performance and cueing for initiation and completion of tasks d/t cognition-see details below. Agreeable to mobility ~80 feet using RW with CGA and cues for upright posture and staying within base of walker. Pt returned to recliner with all needs in place and will cont to require skilled acute OT services to maximize her safety and IND to return to PLOF.       If plan is discharge home, recommend the following:  A little help with walking and/or transfers;A little help with bathing/dressing/bathroom;Assistance with cooking/housework;Help with stairs or ramp for entrance;Supervision due to cognitive status   Equipment Recommendations  Other (comment) (defer)    Recommendations for Other Services      Precautions / Restrictions Precautions Precautions: Fall Restrictions Weight Bearing Restrictions: No       Mobility Bed Mobility   Bed Mobility: Supine to Sit     Supine to sit: Contact guard, Used rails     General bed mobility comments: Min A provided from daughter to get to EOB as pt needing increased encouragement to participate     Transfers Overall transfer level: Needs assistance Equipment used: Rolling walker (2 wheels) Transfers: Sit to/from Stand Sit to Stand: Contact guard assist           General transfer comment: CGA from EOB to RW     Balance Overall balance assessment: Needs assistance Sitting-balance support: No upper extremity supported, Feet supported Sitting balance-Leahy Scale: Normal     Standing balance support: Bilateral upper extremity supported Standing balance-Leahy Scale: Good Standing balance comment: holds to sink or RW for balance at times in standing                           ADL either performed or assessed with clinical judgement   ADL Overall ADL's : Needs assistance/impaired     Grooming: Wash/dry face;Applying deodorant;Standing;Cueing for sequencing;Supervision/safety Grooming Details (indicate cue type and reason): SBA for standing balance at sink with verb cueing for initiation applying deodorant and washing her face. Upper Body Bathing: Cueing for sequencing;Minimal assistance;Standing Upper Body Bathing Details (indicate cue type and reason): Min A with cueing for intiation Lower Body Bathing: Sit to/from stand;Maximal assistance;Cueing for sequencing;Moderate assistance Lower Body Bathing Details (indicate cue type and reason): Mod to MAX A for LB bathing in standing with pt able to bathe peri-area and OT assisting with buttocks and below the knees, continued cueing for initiation and completion of tasks Upper Body Dressing : Minimal assistance Upper Body Dressing Details (indicate cue type and reason): to don gown Lower Body Dressing: Sit to/from stand;Moderate assistance Lower Body Dressing Details (indicate cue type and reason): Min A to don pull up over her feet and  pt able to pull up from there including over hips in standing with SBA; max assist for donning socks             Functional mobility during ADLs: Contact guard assist;Rolling walker  (2 wheels) General ADL Comments: CGA for mobility to the bathroom and additional mobility down the hallway    Extremity/Trunk Assessment Upper Extremity Assessment Upper Extremity Assessment: Overall WFL for tasks assessed   Lower Extremity Assessment Lower Extremity Assessment: Generalized weakness        Vision       Perception     Praxis      Cognition Arousal: Alert Behavior During Therapy: WFL for tasks assessed/performed                                   General Comments: Oriented to person, other questions not asked        Exercises      Shoulder Instructions       General Comments      Pertinent Vitals/ Pain       Pain Assessment Pain Assessment: No/denies pain  Home Living                                          Prior Functioning/Environment              Frequency  Min 1X/week        Progress Toward Goals  OT Goals(current goals can now be found in the care plan section)     Acute Rehab OT Goals Patient Stated Goal: improve strength OT Goal Formulation: With patient/family Time For Goal Achievement: 12/02/22 Potential to Achieve Goals: Good  Plan      Co-evaluation                 AM-PAC OT "6 Clicks" Daily Activity     Outcome Measure   Help from another person eating meals?: None Help from another person taking care of personal grooming?: None Help from another person toileting, which includes using toliet, bedpan, or urinal?: A Little Help from another person bathing (including washing, rinsing, drying)?: A Lot Help from another person to put on and taking off regular upper body clothing?: A Little Help from another person to put on and taking off regular lower body clothing?: A Lot 6 Click Score: 18    End of Session Equipment Utilized During Treatment: Rolling walker (2 wheels);Gait belt  OT Visit Diagnosis: History of falling (Z91.81);Muscle weakness (generalized)  (M62.81);Other abnormalities of gait and mobility (R26.89)   Activity Tolerance Patient tolerated treatment well   Patient Left with family/visitor present;in chair;with chair alarm set;with call bell/phone within reach   Nurse Communication Mobility status        Time: 3664-4034 OT Time Calculation (min): 41 min  Charges: OT General Charges $OT Visit: 1 Visit OT Treatments $Self Care/Home Management : 23-37 mins $Therapeutic Activity: 8-22 mins Breahna Boylen, OTR/L  11/26/22, 3:44 PM  Kanyla Omeara E Cherilyn Sautter 11/26/2022, 3:42 PM

## 2022-11-26 NOTE — Plan of Care (Signed)

## 2022-11-26 NOTE — Progress Notes (Signed)
PT Cancellation Note  Patient Details Name: Monica Singh MRN: [id number] DOB: August 11, 1928   Cancelled Treatment:    Reason Eval/Treat Not Completed: Other (comment). Pt up in room with OT, Pt to re-attempt as able.    Olga Coaster PT, DPT 2:30 PM,11/26/22

## 2022-11-27 ENCOUNTER — Telehealth: Payer: Self-pay | Admitting: Student in an Organized Health Care Education/Training Program

## 2022-11-27 DIAGNOSIS — N390 Urinary tract infection, site not specified: Secondary | ICD-10-CM | POA: Diagnosis not present

## 2022-11-27 LAB — CBC
HCT: 35.3 % — ABNORMAL LOW (ref 36.0–46.0)
Hemoglobin: 11.7 g/dL — ABNORMAL LOW (ref 12.0–15.0)
MCH: 28.9 pg (ref 26.0–34.0)
MCHC: 33.1 g/dL (ref 30.0–36.0)
MCV: 87.2 fL (ref 80.0–100.0)
Platelets: 427 10*3/uL — ABNORMAL HIGH (ref 150–400)
RBC: 4.05 MIL/uL (ref 3.87–5.11)
RDW: 13.7 % (ref 11.5–15.5)
WBC: 6.6 10*3/uL (ref 4.0–10.5)
nRBC: 0 % (ref 0.0–0.2)

## 2022-11-27 LAB — BASIC METABOLIC PANEL
Anion gap: 7 (ref 5–15)
BUN: 17 mg/dL (ref 8–23)
CO2: 24 mmol/L (ref 22–32)
Calcium: 8.7 mg/dL — ABNORMAL LOW (ref 8.9–10.3)
Chloride: 99 mmol/L (ref 98–111)
Creatinine, Ser: 0.74 mg/dL (ref 0.44–1.00)
GFR, Estimated: >60 mL/min (ref >60)
Glucose, Bld: 97 mg/dL (ref 70–99)
Potassium: 3.9 mmol/L (ref 3.5–5.1)
Sodium: 130 mmol/L — ABNORMAL LOW (ref 135–145)

## 2022-11-27 LAB — MAGNESIUM: Magnesium: 1.9 mg/dL (ref 1.7–2.4)

## 2022-11-27 MED ORDER — CEPHALEXIN 500 MG PO CAPS: 500.0000 mg | ORAL_CAPSULE | Freq: Four times a day (QID) | ORAL | 0 refills | Status: AC

## 2022-11-27 NOTE — Discharge Summary (Signed)
Physician Discharge Summary  Lopeno ZOX:[id number] DOB: 01-09-1929 DOA: 11/16/2022  PCP: Malva Limes, MD  Admit date: 11/16/2022 Discharge date: 11/27/2022  Admitted From: home Disposition:  SNF  Recommendations for Outpatient Follow-up:  Follow up with PCP in 1-2 weeks   Home Health: no  Equipment/Devices:  Discharge Condition: stable CODE STATUS:DNR Diet recommendation: Dysphagia II diet   Brief/Interim Summary: HPI was taken from Dr. Clyde Lundborg: Monica Singh is a 89 y.o. female with medical history significant of recurrent UTI, left-sided nephroureteral stent in place, HTN, COPD, subclinical hypothyroidism, dementia, hyponatremia, non-Hodgkin lymphoma, adnexal mass, who presents with altered mental status and weakness, fever.   Per her daughter at the bedside, patient is noted to be more confused than her baseline with weakness today.  At her normal baseline, patient is orientated to the person and place, always confused about time. Pt is normally awake and ambulatory with a walker. Today, pt is so weak that she stay in the bed all the time.  When I saw pt in ED, patient is confused, able to recognizes her daughter, but not orientated to the place and time.  Patient is agitated in ED. She has fever and chills.  Her temperature is 100.4 in ED.  She has poor appetite and decreased oral intake.  No chest pain or abdominal pain per her daughter, but patient seems to have tenderness in the right lower abdomen on examination.  No acute respiratory distress or cough noted.  Patient has vomited once, no diarrhea.  Not sure if patient has symptoms of UTI.   Data reviewed independently and ED Course: pt was found to have positive urinalysis (cloudy appearance, large amount of leukocyte, positive nitrite, many bacteria, WBC> 50), WBC 11.5, creatinine 0.91, BUN 13, GFR 59, sodium 129, lactic acid is 0.9, negative PCR for COVID.  Temperature 100.4, blood pressure 118/70, heart rate  of 108--> 87, RR 18, oxygen sat 96% on room air.  Chest x-ray negative.  Patient is admitted to MedSurg bed as inpatient.   CT-abdomen/pelvis 1. Mild enhancement of the right renal pelvis and proximal ureter, suspicious for urinary tract infection. Recommend correlation with urinalysis. 2. Left-sided nephroureteral stent in place. Mild chronic dilatation of the left renal pelvis. 3. Left adnexal cyst measuring 7.1 x 5.1 cm, minimally increased in size from 2020 exam when it measured 5.9 x 4.4 cm. Pelvic ultrasound can be performed for follow-up as clinically indicated given patient's advanced age. 4. Moderate to large hiatal hernia. 5. Colonic diverticulosis without diverticulitis. Discharge Diagnoses:  Principal Problem:   Complicated UTI (urinary tract infection) Active Problems:   Acute metabolic encephalopathy   Hyponatremia   COPD (chronic obstructive pulmonary disease) (HCC)   Essential hypertension   H/O non-Hodgkin's lymphoma   Adnexal mass Sepsis: met criteria w/ fever, tachycardia, leukocytosis & UTI & right pyelonephritis. Sepsis resolved   Complicated UTI & right pyelonephritis: w/ left ureteral stent & hx of carbapenem resistant pseudomonas. Chronically pt is on methenamine daily but this is on hold currently. Urine cx pos for multiple species. Continue on keflex x 2 days more.  Acute metabolic encephalopathy: w/ baseline dementia. Re-orient prn    Hypokalemia: WNL today  Chronic hyponatremia: labile. Will monitor intermittently    Normocytic anemia: H&H are stable. No need for a transfusion currently    COPD: w/o exacerbation. Bronchodilators prn   HTN: BP is WNL w/o amlodipine. D/c amlodipine    Hx non-Hodgkin's lymphoma: f/u outpatient w/ onco    Adnexal  mass: known left adnexal cystic mass, CT scan showed slightly increased size since 2020. Follow-up with PCP for outpatient workup   Possible vaginal yeast infection: will complete fluconazole doses  today    Discharge Instructions  Discharge Instructions     Diet general   Complete by: As directed    Dysphagia II diet   Discharge instructions   Complete by: As directed    F/u in 1-2 weeks   Increase activity slowly   Complete by: As directed       Allergies as of 11/27/2022       Reactions   Ciprofloxacin Itching   Whelps on face reported 03/16/2021   Milk-related Compounds Diarrhea, Nausea And Vomiting   Gas too   Latex Itching   Misc. Sulfonamide Containing Compounds    Milk (cow) Diarrhea, Nausea And Vomiting   Milk allergy   Sulfa Antibiotics Hives, Itching        Medication List     STOP taking these medications    amLODipine 5 MG tablet Commonly known as: NORVASC   Combivent Respimat 20-100 MCG/ACT Aers respimat Generic drug: Ipratropium-Albuterol   hydrocortisone 2.5 % cream   ibuprofen 200 MG tablet Commonly known as: ADVIL   ketoconazole 2 % cream Commonly known as: NIZORAL   sodium chloride HYPERTONIC 3 % nebulizer solution       TAKE these medications    AeroChamber MV inhaler Use as instructed   albuterol 108 (90 Base) MCG/ACT inhaler Commonly known as: VENTOLIN HFA Inhale 2 puffs into the lungs every 6 (six) hours as needed for wheezing or shortness of breath.   Align 4 MG Caps Take 1 capsule (4 mg total) by mouth daily.   Breztri Aerosphere 160-9-4.8 MCG/ACT Aero Generic drug: Budeson-Glycopyrrol-Formoterol Inhale 2 puffs into the lungs in the morning and at bedtime.   cephALEXin 500 MG capsule Commonly known as: KEFLEX Take 1 capsule (500 mg total) by mouth every 6 (six) hours for 2 days. What changed:  medication strength how much to take when to take this   Dextromethorphan HBr 15 MG Tabs Take 15 mg by mouth every 12 (twelve) hours as needed (cough).   folic acid 400 MCG tablet Commonly known as: FOLVITE Take 400 mcg by mouth daily.   ipratropium 0.02 % nebulizer solution Commonly known as: ATROVENT Take  0.5 mg by nebulization every 8 (eight) hours as needed for shortness of breath (cough).   methenamine 1 g tablet Commonly known as: HIPREX TAKE ONE (1) TABLET BY MOUTH TWO TIMES PER DAY   psyllium 0.52 g capsule Commonly known as: REGULOID Take 0.52 g by mouth daily. What changed: Another medication with the same name was removed. Continue taking this medication, and follow the directions you see here.   Spiriva HandiHaler 18 MCG inhalation capsule Generic drug: tiotropium Place 18 mcg into inhaler and inhale daily.   Vitamin B-12 CR 1000 MCG Tbcr Take 1 tablet (1,000 mcg total) by mouth daily.        Contact information for after-discharge care     Destination     [facility] Preferred SNF .   Service: Skilled Nursing Contact information: [address] [name] [address] [phone]                    Allergies  Allergen Reactions   Ciprofloxacin Itching    Whelps on face reported 03/16/2021   Milk-Related Compounds  Diarrhea and Nausea And Vomiting    Gas too   Latex Itching   Misc. Sulfonamide Containing Compounds    Milk (Cow) Diarrhea and Nausea And Vomiting    Milk allergy    Sulfa Antibiotics Hives and Itching    Consultations:    Procedures/Studies: CT HEAD WO CONTRAST ( )  Result Date: 11/17/2022 CLINICAL DATA:  Mental status change, unknown cause EXAM: CT HEAD WITHOUT CONTRAST TECHNIQUE: Contiguous axial images were obtained from the base of the skull through the vertex without intravenous contrast. RADIATION DOSE REDUCTION: This exam was performed according to the departmental dose-optimization program which includes automated exposure control, adjustment of the mA and/or kV according to patient size and/or use of iterative reconstruction technique. COMPARISON:  CT head 10/22/2022 FINDINGS: Brain: Cerebral ventricle sizes are concordant with the  degree of cerebral volume loss. Patchy and confluent areas of decreased attenuation are noted throughout the deep and periventricular white matter of the cerebral hemispheres bilaterally, compatible with chronic microvascular ischemic disease. No evidence of large-territorial acute infarction. No parenchymal hemorrhage. No mass lesion. No extra-axial collection. No mass effect or midline shift. No hydrocephalus. Basilar cisterns are patent. Vascular: No hyperdense vessel. Atherosclerotic calcifications are present within the cavernous internal carotid arteries. Skull: No acute fracture or focal lesion. Sinuses/Orbits: Right sphenoid sinus and left ethmoid mucosal thickening. Paranasal sinuses and mastoid air cells are clear. Bilateral lens replacement. Otherwise the orbits are unremarkable. Other: None. IMPRESSION: No acute intracranial abnormality. Electronically Signed   By: Tish Frederickson M.D.   On: 11/17/2022 01:33   CT ABDOMEN PELVIS W CONTRAST  Result Date: 11/16/2022 CLINICAL DATA:  89 year old with right lower quadrant pain. EXAM: CT ABDOMEN AND PELVIS WITH CONTRAST TECHNIQUE: Multidetector CT imaging of the abdomen and pelvis was performed using the standard protocol following bolus administration of intravenous contrast. RADIATION DOSE REDUCTION: This exam was performed according to the departmental dose-optimization program which includes automated exposure control, adjustment of the mA and/or kV according to patient size and/or use of iterative reconstruction technique. CONTRAST:  OMNIPAQUE IOHEXOL 300 MG/ML  SOLN COMPARISON:  Pelvic CT 10/27/2022.  Abdominopelvic CT 02/10/2018 FINDINGS: Lower chest: Coronary artery and mitral annulus calcifications. Hiatal hernia. No acute basilar airspace disease or pleural effusion. Hepatobiliary: No focal liver abnormality is seen. No gallstones, gallbladder wall thickening, or biliary dilatation. Pancreas: No ductal dilatation or inflammation. Spleen:  Normal in size without focal abnormality. Adrenals/Urinary Tract: No adrenal nodule. There is bilateral perinephric edema. Mild enhancement of the right renal pelvis and proximal ureter. Left-sided nephroureteral stent in place. There is mild dilatation of the left renal pelvis but no calyceal dilatation. No visualized urolithiasis. No convincing bladder wall thickening. No bladder stone. Stomach/Bowel: Moderate to large hiatal hernia, greater than 50% of the stomach is intrathoracic. There is no small bowel distension or evidence of obstruction. Normal appendix visualized. Small to moderate volume of colonic stool. Diverticulosis from the descending colon distally. No diverticulitis or acute colonic inflammation. Vascular/Lymphatic: Advanced aortic and branch atherosclerosis. No aortic aneurysm. The portal vein is patent. No enlarged lymph nodes in the abdomen or pelvis. Reproductive: Left adnexal cyst measures 7.1 x 5.1 cm. Minimally increased in size from 2020 exam when it measured 5.9 x 4.4 cm. There is a peripheral calcification. Hysterectomy. Other: No ascites.  No free air or focal fluid collection. Musculoskeletal: Scoliosis and degenerative change in the spine. Remote L1 compression deformity post augmentation. No acute osseous findings. IMPRESSION: 1. Mild enhancement of the right renal pelvis and  proximal ureter, suspicious for urinary tract infection. Recommend correlation with urinalysis. 2. Left-sided nephroureteral stent in place. Mild chronic dilatation of the left renal pelvis. 3. Left adnexal cyst measuring 7.1 x 5.1 cm, minimally increased in size from 2020 exam when it measured 5.9 x 4.4 cm. Pelvic ultrasound can be performed for follow-up as clinically indicated given patient's advanced age. 4. Moderate to large hiatal hernia. 5. Colonic diverticulosis without diverticulitis. Aortic Atherosclerosis (ICD10-I70.0). Electronically Signed   By: Narda Rutherford M.D.   On: 11/16/2022 21:21   DG  Chest Portable 1 View  Result Date: 11/16/2022 CLINICAL DATA:  cough, weakness, fever EXAM: PORTABLE CHEST 1 VIEW COMPARISON:  07/01/2022 FINDINGS: The heart size and mediastinal contours are within normal limits. Aortic atherosclerosis. No focal airspace consolidation, pleural effusion, or pneumothorax. The visualized skeletal structures are unremarkable. IMPRESSION: No active disease. Electronically Signed   By: Duanne Guess D.O.   On: 11/16/2022 19:21   (Echo, Carotid, EGD, Colonoscopy, ERCP)    Subjective: Pt is pleasantly confused    Discharge Exam: Vitals:   11/27/22 0405 11/27/22 0737  BP: 128/72 130/61  Pulse: 82 88  Resp: 18 16  Temp: 97.7 F (36.5 C) 97.9 F (36.6 C)  SpO2: 93% 94%   Vitals:   11/26/22 1545 11/26/22 1944 11/27/22 0405 11/27/22 0737  BP: 113/62 116/65 128/72 130/61  Pulse: 90 88 82 88  Resp: 17  18 16   Temp: 98.2 F (36.8 C) 98 F (36.7 C) 97.7 F (36.5 C) 97.9 F (36.6 C)  TempSrc:      SpO2: 98% 93% 93% 94%  Weight:      Height:        General: Pt is alert, awake, not in acute distress Cardiovascular: S1/S2 +, no rubs, no gallops Respiratory: CTA bilaterally, no wheezing, no rhonchi Abdominal: Soft, NT, ND, bowel sounds + Extremities: no edema, no cyanosis    The results of significant diagnostics from this hospitalization (including imaging, microbiology, ancillary and laboratory) are listed below for reference.     Microbiology: No results found for this or any previous visit (from the past 240 hour(s)).   Labs: BNP (last 3 results) No results for input(s): "BNP" in the last 8760 hours. Basic Metabolic Panel: Recent Labs  Lab 11/25/22 0430 11/26/22 0330 11/27/22 0518  NA 132* 130* 130*  K 3.6 3.6 3.9  CL 98 98 99  CO2 26 24 24   GLUCOSE 99 102* 97  BUN 17 21 17   CREATININE 0.88 0.81 0.74  CALCIUM 8.8* 8.6* 8.7*  MG 2.2 1.9 1.9   Liver Function Tests: No results for input(s): "AST", "ALT", "ALKPHOS", "BILITOT",  "PROT", "ALBUMIN" in the last 168 hours. No results for input(s): "LIPASE", "AMYLASE" in the last 168 hours. No results for input(s): "AMMONIA" in the last 168 hours. CBC: Recent Labs  Lab 11/25/22 0430 11/26/22 0330 11/27/22 0518  WBC 8.4 7.7 6.6  HGB 11.7* 11.5* 11.7*  HCT 35.0* 34.5* 35.3*  MCV 85.8 86.5 87.2  PLT 425* 423* 427*   Cardiac Enzymes: No results for input(s): "CKTOTAL", "CKMB", "CKMBINDEX", "TROPONINI" in the last 168 hours. BNP: Invalid input(s): "POCBNP" CBG: No results for input(s): "GLUCAP" in the last 168 hours. D-Dimer No results for input(s): "DDIMER" in the last 72 hours. Hgb A1c No results for input(s): "HGBA1C" in the last 72 hours. Lipid Profile No results for input(s): "CHOL", "HDL", "LDLCALC", "TRIG", "CHOLHDL", "LDLDIRECT" in the last 72 hours. Thyroid function studies No results for input(s): "TSH", "T4TOTAL", "  T3FREE", "THYROIDAB" in the last 72 hours.  Invalid input(s): "FREET3" Anemia work up No results for input(s): "VITAMINB12", "FOLATE", "FERRITIN", "TIBC", "IRON", "RETICCTPCT" in the last 72 hours. Urinalysis    Component Value Date/Time   COLORURINE YELLOW (A) 11/16/2022 1929   APPEARANCEUR CLOUDY (A) 11/16/2022 1929   APPEARANCEUR Cloudy (A) 09/23/2022 1007   LABSPEC 1.016 11/16/2022 1929   LABSPEC 1.010 01/09/2013 0647   PHURINE 5.0 11/16/2022 1929   GLUCOSEU NEGATIVE 11/16/2022 1929   GLUCOSEU Negative 01/09/2013 0647   HGBUR SMALL (A) 11/16/2022 1929   BILIRUBINUR NEGATIVE 11/16/2022 1929   BILIRUBINUR Negative 09/23/2022 1007   BILIRUBINUR Negative 01/09/2013 0647   KETONESUR NEGATIVE 11/16/2022 1929   PROTEINUR 30 (A) 11/16/2022 1929   UROBILINOGEN 0.2 04/09/2022 1452   NITRITE POSITIVE (A) 11/16/2022 1929   LEUKOCYTESUR LARGE (A) 11/16/2022 1929   LEUKOCYTESUR 2+ 01/09/2013 0647   Sepsis Labs Recent Labs  Lab 11/25/22 0430 11/26/22 0330 11/27/22 0518  WBC 8.4 7.7 6.6   Microbiology No results found for this  or any previous visit (from the past 240 hour(s)).   Time coordinating discharge: Over 30 minutes  SIGNED:   Charise Killian, MD  [facility] 11/27/2022, 8:14 AM Pager   If 7PM-7AM, please contact night-coverage [email or web address]

## 2022-11-27 NOTE — Telephone Encounter (Signed)
Pharm calling. States they need clarification on meds there seems to be a duplicate order.

## 2022-11-27 NOTE — Progress Notes (Signed)
Pt is alert and oriented x 2 with forgetfulness. Pt report given to RN Soumy. All questions are addressed vis phone.no any questions or concerns at this time.

## 2022-11-27 NOTE — Care Management Important Message (Signed)
Important Message  Patient Details  Name: Monica Singh MRN: [id number] Date of Birth: [birth year]   Important Message Given:  Yes - Medicare IM     Verita Schneiders Kirstein Baxley 11/27/2022, 9:37 AM

## 2022-11-27 NOTE — TOC Transition Note (Signed)
Transition of Care [facility]) - CM/SW Discharge Note   Patient Details  Name: Monica Singh MRN: [id number] Date of Birth: [birth year]  [facility] CM/SW Contact:  Allena Katz, LCSW Phone Number: 11/27/2022, 9:17 AM   Clinical Narrative:   Pt has orders to discharge to [facility]. RN given number for report. Dc summary sent. Tiffanie notified. Daughter [name] to transport around 10 am today,    Final next level of care: Skilled Nursing Facility Barriers to Discharge: Barriers Resolved   Patient Goals and CMS Choice CMS Medicare.gov Compare Post Acute Care list provided to:: Patient    Discharge Placement                Patient chooses bed at: [facility] Patient to be transferred to facility by: Daughter virginia Name of family member notified: virginia Patient and family notified of of transfer: 11/27/22  Discharge Plan and Services Additional resources added to the After Visit Summary for       Post Acute Care Choice: Skilled Nursing Facility                               Social Determinants of Health (SDOH) Interventions SDOH Screenings   Food Insecurity: Patient Unable To Answer (11/18/2022)  Housing: Patient Unable To Answer (11/18/2022)  Transportation Needs: Patient Unable To Answer (11/18/2022)  Utilities: Patient Unable To Answer (11/18/2022)  Alcohol Screen: Low Risk  (08/19/2022)  Depression (PHQ2-9): Low Risk  (08/19/2022)  Financial Resource Strain: Low Risk  (08/19/2022)  Physical Activity: Insufficiently Active (08/19/2022)  Social Connections: Moderately Isolated (08/19/2022)  Stress: No Stress Concern Present (08/19/2022)  Tobacco Use: Medium Risk (11/18/2022)  Health Literacy: Inadequate Health Literacy (08/19/2022)     Readmission Risk Interventions     No data to display

## 2022-12-02 NOTE — Telephone Encounter (Signed)
I spoke with [facility]. They show the patient has Breztri and Spiriva on her chart as taking. I told them Dr. Aundria Rud gave her Markus Daft and Albuterol to take. She said Spiriva was on her chart from May. I told her she should not be taking the Spiriva, just the Breztri and Albuterol.  Nothing further needed.

## 2023-01-19 ENCOUNTER — Emergency Department: Payer: Medicare Other

## 2023-01-19 ENCOUNTER — Emergency Department
Admission: EM | Admit: 2023-01-19 | Discharge: 2023-01-19 | Disposition: A | Discharge status: Home / Self Care | Payer: Medicare Other | Attending: Emergency Medicine | Admitting: Emergency Medicine

## 2023-01-19 ENCOUNTER — Encounter: Payer: Self-pay | Admitting: Emergency Medicine

## 2023-01-19 ENCOUNTER — Other Ambulatory Visit: Payer: Self-pay

## 2023-01-19 DIAGNOSIS — I10 Essential (primary) hypertension: Secondary | ICD-10-CM | POA: Diagnosis not present

## 2023-01-19 DIAGNOSIS — J449 Chronic obstructive pulmonary disease, unspecified: Secondary | ICD-10-CM | POA: Diagnosis not present

## 2023-01-19 DIAGNOSIS — J189 Pneumonia, unspecified organism: Secondary | ICD-10-CM | POA: Insufficient documentation

## 2023-01-19 DIAGNOSIS — F0390 Unspecified dementia without behavioral disturbance: Secondary | ICD-10-CM | POA: Insufficient documentation

## 2023-01-19 DIAGNOSIS — Z20822 Contact with and (suspected) exposure to covid-19: Secondary | ICD-10-CM | POA: Diagnosis not present

## 2023-01-19 DIAGNOSIS — R0602 Shortness of breath: Secondary | ICD-10-CM | POA: Diagnosis present

## 2023-01-19 LAB — CBC WITH DIFFERENTIAL/PLATELET
Abs Immature Granulocytes: 0.07 10*3/uL (ref 0.00–0.07)
Basophils Absolute: 0.1 10*3/uL (ref 0.0–0.1)
Basophils Relative: 1 %
Eosinophils Absolute: 0.3 10*3/uL (ref 0.0–0.5)
Eosinophils Relative: 2 %
HCT: 36.9 % (ref 36.0–46.0)
Hemoglobin: 12 g/dL (ref 12.0–15.0)
Immature Granulocytes: 1 %
Lymphocytes Relative: 15 %
Lymphs Abs: 2.2 10*3/uL (ref 0.7–4.0)
MCH: 28.4 pg (ref 26.0–34.0)
MCHC: 32.5 g/dL (ref 30.0–36.0)
MCV: 87.4 fL (ref 80.0–100.0)
Monocytes Absolute: 1.1 10*3/uL — ABNORMAL HIGH (ref 0.1–1.0)
Monocytes Relative: 8 %
Neutro Abs: 10.7 10*3/uL — ABNORMAL HIGH (ref 1.7–7.7)
Neutrophils Relative %: 73 %
Platelets: 366 10*3/uL (ref 150–400)
RBC: 4.22 MIL/uL (ref 3.87–5.11)
RDW: 13.2 % (ref 11.5–15.5)
WBC: 14.5 10*3/uL — ABNORMAL HIGH (ref 4.0–10.5)
nRBC: 0 % (ref 0.0–0.2)

## 2023-01-19 LAB — RESP PANEL BY RT-PCR (RSV, FLU A&B, COVID)  RVPGX2
Influenza A by PCR: NEGATIVE
Influenza B by PCR: NEGATIVE
Resp Syncytial Virus by PCR: NEGATIVE
SARS Coronavirus 2 by RT PCR: NEGATIVE

## 2023-01-19 LAB — BASIC METABOLIC PANEL
Anion gap: 10 (ref 5–15)
BUN: 19 mg/dL (ref 8–23)
CO2: 26 mmol/L (ref 22–32)
Calcium: 8.8 mg/dL — ABNORMAL LOW (ref 8.9–10.3)
Chloride: 92 mmol/L — ABNORMAL LOW (ref 98–111)
Creatinine, Ser: 0.87 mg/dL (ref 0.44–1.00)
GFR, Estimated: >60 mL/min (ref >60)
Glucose, Bld: 127 mg/dL — ABNORMAL HIGH (ref 70–99)
Potassium: 3.9 mmol/L (ref 3.5–5.1)
Sodium: 128 mmol/L — ABNORMAL LOW (ref 135–145)

## 2023-01-19 LAB — TROPONIN I (HIGH SENSITIVITY): Troponin I (High Sensitivity): 11 ng/L (ref <18)

## 2023-01-19 MED ORDER — AMOXICILLIN-POT CLAVULANATE 875-125 MG PO TABS
1.0000 | ORAL_TABLET | Freq: Once | ORAL | Status: AC
  Administered 2023-01-19: 1 via ORAL
  Filled 2023-01-19: qty 1

## 2023-01-19 MED ORDER — AMOXICILLIN-POT CLAVULANATE 875-125 MG PO TABS: 1.0000 | ORAL_TABLET | Freq: Two times a day (BID) | ORAL | 0 refills | Status: AC

## 2023-01-19 MED ORDER — AZITHROMYCIN 250 MG PO TABS
ORAL_TABLET | ORAL | 0 refills | Status: AC
Start: 2023-01-19 — End: 2023-01-24

## 2023-01-19 NOTE — ED Provider Notes (Signed)
[facility] Provider Note    Event Date/Time   First MD Initiated Contact with Patient 01/19/23 1724     (approximate)   History   Chief Complaint Shortness of Breath   HPI  Monica Singh Fever is a 89 y.o. female with past medical history of hypertension, COPD, hyponatremia, non-Hodgkin's lymphoma, and dementia who presents to the ED complaining of shortness of breath.  Majority of history is obtained from patient's daughter at bedside, who states that patient first developed a cough 3 days ago.  Cough has worsened since then and patient has been coughing up thick yellowish sputum with increasing difficulty breathing.  They are not aware of any fevers and patient denies any pain in her chest.  She has not had any nausea, vomiting, diarrhea, or dysuria.  Daughter does report that another resident at patient's nursing facility has been sick with similar symptoms.     Physical Exam   Triage Vital Signs: ED Triage Vitals [01/19/23 1726]  Encounter Vitals Group     BP      Systolic BP Percentile      Diastolic BP Percentile      Pulse      Resp      Temp      Temp src      SpO2      Weight 143 lb 4.8 oz (65 kg)     Height 5\' 3"  (1.6 m)     Head Circumference      Peak Flow      Pain Score 0     Pain Loc      Pain Education      Exclude from Growth Chart     Most recent vital signs: Vitals:   01/19/23 1728  BP: (!) 143/68  Pulse: (!) 105  Resp: 18  Temp: 97.9 F (36.6 C)  SpO2: 100%    Constitutional: Alert and oriented. Eyes: Conjunctivae are normal. Head: Atraumatic. Nose: No congestion/rhinnorhea. Mouth/Throat: Mucous membranes are moist.  Cardiovascular: Normal rate, regular rhythm. Grossly normal heart sounds.  2+ radial pulses bilaterally. Respiratory: Normal respiratory effort.  No retractions. Lungs CTAB. Gastrointestinal: Soft and nontender. No distention. Musculoskeletal: No lower extremity tenderness nor edema.   Neurologic:  Normal speech and language. No gross focal neurologic deficits are appreciated.    ED Results / Procedures / Treatments   Labs (all labs ordered are listed, but only abnormal results are displayed) Labs Reviewed  CBC WITH DIFFERENTIAL/PLATELET - Abnormal; Notable for the following components:      Result Value   WBC 14.5 (*)    Neutro Abs 10.7 (*)    Monocytes Absolute 1.1 (*)    All other components within normal limits  BASIC METABOLIC PANEL - Abnormal; Notable for the following components:   Sodium 128 (*)    Chloride 92 (*)    Glucose, Bld 127 (*)    Calcium 8.8 (*)    All other components within normal limits  RESP PANEL BY RT-PCR (RSV, FLU A&B, COVID)  RVPGX2  TROPONIN I (HIGH SENSITIVITY)     EKG  ED ECG REPORT I, Chesley Noon, the attending physician, personally viewed and interpreted this ECG.   Date: 01/19/2023  EKG Time: 18:07  Rate: 99  Rhythm: normal sinus rhythm  Axis: LAD  Intervals:none  ST&T Change: None  RADIOLOGY Chest x-ray reviewed and interpreted by me with no infiltrate, edema, or effusion.  PROCEDURES:  Critical Care performed: No  Procedures  MEDICATIONS ORDERED IN ED: Medications  amoxicillin-clavulanate (AUGMENTIN) 875-125 MG per tablet 1 tablet (has no administration in time range)     IMPRESSION / MDM / ASSESSMENT AND PLAN / ED COURSE  I reviewed the triage vital signs and the nursing notes.                              89 y.o. female with past medical history of hypertension, COPD, non-Hodgkin's lymphoma, hyponatremia, and dementia who presents to the ED with productive cough with increasing difficulty breathing over the past 3 days.  Patient's presentation is most consistent with acute presentation with potential threat to life or bodily function.  Differential diagnosis includes, but is not limited to, sepsis, pneumonia, COVID-19, influenza, anemia, electrolyte abnormality, AKI, ACS.  Patient  nontoxic-appearing and in no acute distress, vital signs remarkable for mild tachycardia but otherwise reassuring.  Patient is not in any respiratory distress and maintaining oxygen saturations at 100% on room air.  Lungs are clear to auscultation bilaterally, chest x-ray pending at this time.  EKG shows no evidence arrhythmia or ischemia and I doubt ACS or PE.  Labs and COVID testing are also pending.  Labs remarkable for mild leukocytosis and mild hyponatremia, no significant anemia or AKI noted.  Troponin within normal limits and I doubt ACS or PE.  COVID and flu testing is negative, chest x-ray unremarkable.  I do have concerns for developing pneumonia given leukocytosis with productive cough and we will start patient on antibiotics.  She does not appear septic at this time and is appropriate for outpatient management and discharged back to her nursing facility.  Daughter counseled to have her return to the ED for new or worsening symptoms, daughter agrees with plan.      FINAL CLINICAL IMPRESSION(S) / ED DIAGNOSES   Final diagnoses:  Community acquired pneumonia, unspecified laterality     Rx / DC Orders   ED Discharge Orders          Ordered    amoxicillin-clavulanate (AUGMENTIN) 875-125 MG tablet  2 times daily        01/19/23 1852    azithromycin (ZITHROMAX Z-PAK) 250 MG tablet        01/19/23 1852             Note:  This document was prepared using Dragon voice recognition software and may include unintentional dictation errors.   Chesley Noon, MD 01/19/23 [phone]

## 2023-01-19 NOTE — ED Triage Notes (Addendum)
Patient to ED via POV for SOB and cough. Pt reports productive cough- getting up green sputum. Hx of COPD. Daughter also reporting more weak than normal.

## 2023-01-20 ENCOUNTER — Telehealth: Payer: Self-pay

## 2023-01-20 NOTE — Transitions of Care (Post Inpatient/ED Visit) (Unsigned)
   01/20/2023  Name: Monica Singh MRN: [id number] DOB: 28-Aug-1928  Today's TOC FU Call Status: Today's TOC FU Call Status:: Unsuccessful Call (1st Attempt) Unsuccessful Call (1st Attempt) Date: 01/20/23  Attempted to reach the patient regarding the most recent Inpatient/ED visit.  Follow Up Plan: Additional outreach attempts will be made to reach the patient to complete the Transitions of Care (Post Inpatient/ED visit) call.   Signature Karena Addison, LPN [facility] Nurse Health Advisor Direct Dial [phone]

## 2023-01-30 NOTE — Transitions of Care (Post Inpatient/ED Visit) (Signed)
   01/30/2023  Name: Monica  [name] MRN: [id number] DOB: Mar 20, 1928  Today's TOC FU Call Status: Today's TOC FU Call Status:: Successful TOC FU Call Completed Unsuccessful Call (1st Attempt) Date: 01/20/23 [facility] FU Call Complete Date: 01/30/23 Patient's Name and Date of Birth confirmed.  Transition Care Management Follow-up Telephone Call Date of Discharge: 02/19/23 Discharge Facility: [facility]) Type of Discharge: Emergency Department Reason for ED Visit: Other: (pneumonia) How have you been since you were released from the hospital?: Same Any questions or concerns?: No  Items Reviewed: Did you receive and understand the discharge instructions provided?: Yes Medications obtained,verified, and reconciled?: Yes (Medications Reviewed) Any new allergies since your discharge?: No Dietary orders reviewed?: Yes Do you have support at home?: Yes People in Home: facility resident  Medications Reviewed Today: Medications Reviewed Today   Medications were not reviewed in this encounter     Home Care and Equipment/Supplies: Were Home Health Services Ordered?: NA Any new equipment or medical supplies ordered?: NA  Functional Questionnaire: Do you need assistance with bathing/showering or dressing?: Yes Do you need assistance with meal preparation?: Yes Do you need assistance with eating?: Yes Do you have difficulty maintaining continence: Yes Do you need assistance with getting out of bed/getting out of a chair/moving?: Yes Do you have difficulty managing or taking your medications?: Yes  Follow up appointments reviewed: PCP Follow-up appointment confirmed?: No (in memory care facility, see PCP there) MD Provider Line Number:[phone] Given: No Specialist Hospital Follow-up appointment confirmed?: NA Do you need transportation to your follow-up appointment?: No Do you understand care options if your condition(s) worsen?: Yes-patient verbalized  understanding    SIGNATURE Julian Lemmings, LPN [facility] Nurse Health Advisor Direct Dial [phone]

## 2023-02-20 ENCOUNTER — Other Ambulatory Visit: Payer: Self-pay | Admitting: Family Medicine

## 2023-05-08 ENCOUNTER — Other Ambulatory Visit: Payer: Self-pay

## 2023-05-08 ENCOUNTER — Ambulatory Visit (INDEPENDENT_AMBULATORY_CARE_PROVIDER_SITE_OTHER): Payer: Self-pay | Admitting: Urology

## 2023-05-08 VITALS — BP 116/73 | HR 101 | Ht 62.0 in | Wt 144.0 lb

## 2023-05-08 DIAGNOSIS — N135 Crossing vessel and stricture of ureter without hydronephrosis: Secondary | ICD-10-CM | POA: Diagnosis not present

## 2023-05-08 DIAGNOSIS — Z01818 Encounter for other preprocedural examination: Secondary | ICD-10-CM | POA: Diagnosis not present

## 2023-05-08 DIAGNOSIS — N390 Urinary tract infection, site not specified: Secondary | ICD-10-CM

## 2023-05-08 LAB — URINALYSIS, COMPLETE
Bilirubin, UA: NEGATIVE
Glucose, UA: NEGATIVE
Ketones, UA: NEGATIVE
Nitrite, UA: POSITIVE — AB
Specific Gravity, UA: 1.025 (ref 1.005–1.030)
Urobilinogen, Ur: 0.2 mg/dL (ref 0.2–1.0)
pH, UA: 5.5 (ref 5.0–7.5)

## 2023-05-08 LAB — MICROSCOPIC EXAMINATION
Epithelial Cells (non renal): >10 /HPF — AB (ref 0–10)
RBC, Urine: >30 /HPF — AB (ref 0–2)
WBC, UA: >30 /HPF — AB (ref 0–5)

## 2023-05-08 NOTE — Progress Notes (Signed)
   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  Diagnostic ureteroscopy in January 2020 showed no concerning lesions, cytology was negative.  She has undergone yearly stent changes since that time with improvement of her left-sided flank pain.  She has had some problems with recurrent UTIs, she never has UTI symptoms aside from confusion.  She was unable to use topical estrogen cream secondary to her confusion and forgetfulness, and had no significant improvement with cranberry tablets or Hiprex. After stent change from 07/12/2022 I started her on low-dose 250 mg daily Keflex prophylaxis.  This has improved her recurrent UTIs significantly, and she has had only 1 UTI in the last 6 months.  She also was relocated to a new assisted living facility which changes her depends more frequently likely helping prevent infections.  Continue low-dose 250 mg Keflex prophylaxis, can use fosfomycin as needed with severe confusion felt to be related to UTI Schedule cystoscopy and left ureteral stent change  Sondra Come, MD  [facility] [address], [address] [place], [place] [address] [phone]

## 2023-05-08 NOTE — Progress Notes (Signed)
 Surgical Physician Order Form [facility]   Dr. Legrand Rams, MD   * Scheduling expectation : May 2025  *Length of Case: 30 minutes  *Clearance needed: no  *Anticoagulation Instructions: May continue all anticoagulants  *Aspirin Instructions: Ok to continue Aspirin  *Post-op visit Date/Instructions:  tbd  *Diagnosis: Left UPJ obstruction  *Procedure: left  Cysto w/stent exchange (61607)   Additional orders: N/A  -Admit type: OUTpatient  -Anesthesia: MAC  -VTE Prophylaxis Standing Order SCD's       Other:   -Standing Lab Orders Per Anesthesia    Lab other: UA&Urine Culture sent 4/10  -Standing Test orders EKG/Chest x-ray per Anesthesia       Test other:   - Medications:  Ancef 2gm IV  -Other orders:  N/A

## 2023-05-11 LAB — CULTURE, URINE COMPREHENSIVE

## 2023-05-12 ENCOUNTER — Telehealth: Payer: Self-pay

## 2023-05-12 NOTE — Telephone Encounter (Signed)
 Per Dr. Estanislao Heimlich, Patient is to be scheduled for Cystoscopy with Stent Exchange   Mrs. Reisch and Daughter [name]  was contacted and possible surgical dates were discussed, Friday May 2nd,2025 was agreed upon for surgery.  Patient was directed to call [phone] between 1-3pm the day before surgery to find out surgical arrival time.  Instructions were given not to eat or drink from midnight on the night before surgery and have a driver for the day of surgery. On the surgery day patient was instructed to enter through the Medical Mall entrance of [facility] report the Same Day Surgery desk.   Pre-Admit Testing will be in contact via phone to set up an interview with the anesthesia team to review your history and medications prior to surgery.   Reminder of this information was sent via MyChart to the patient.

## 2023-05-12 NOTE — Progress Notes (Signed)
    [facility] Surgical Posting Form  Surgery Date: Date: 05/30/2023  Surgeon: Dr. Jay Meth, MD  Inpt ( No  )   Outpt (Yes)   Obs ( No  )   Diagnosis: N13.5 Obstruction of Left Ureteropelvic Junction  -CPT: 40981  Surgery: Cystoscopy with Stent Exchange  Stop Anticoagulations: No, may continue all  Cardiac/Medical/Pulmonary Clearance needed: no  *Orders entered into EPIC  Date: 05/12/23   *Case booked in EPIC  Date: 05/12/23  *Notified pt of Surgery: Date: 05/12/23  PRE-OP UA & CX: yes, sent in clinic on 05/08/2023  *Placed into Prior Authorization Work [place] Date: 05/12/23  Assistant/laser/rep:No

## 2023-05-22 ENCOUNTER — Encounter
Admission: RE | Admit: 2023-05-22 | Discharge: 2023-05-22 | Disposition: A | Discharge status: Home / Self Care | Source: Ambulatory Visit | Attending: Urology | Admitting: Urology

## 2023-05-22 HISTORY — DX: Hypothyroidism, unspecified: E03.9

## 2023-05-22 HISTORY — DX: Crossing vessel and stricture of ureter without hydronephrosis: N13.5

## 2023-05-22 HISTORY — DX: Other specified health status: Z78.9

## 2023-05-22 HISTORY — DX: Do not resuscitate: Z66

## 2023-05-22 HISTORY — DX: Diaphragmatic hernia without obstruction or gangrene: K44.9

## 2023-05-22 NOTE — Patient Instructions (Signed)
 Your procedure is scheduled on:05-30-23 Friday Report to the Registration Desk on the 1st floor of the Medical Mall.Then proceed to the 2nd floor Surgery Desk To find out your arrival time, please call [phone] between 1PM - 3PM on:05-29-23 Thursday If your arrival time is 6:00 am, do not arrive before that time as the Medical Mall entrance doors do not open until 6:00 am.  REMEMBER: Instructions that are not followed completely may result in serious medical risk, up to and including death; or upon the discretion of your surgeon and anesthesiologist your surgery may need to be rescheduled.  Do not eat food OR drink any liquids after midnight the night before surgery.  No gum chewing or hard candies.  One week prior to surgery:Stop NOW (05-22-23) Stop Anti-inflammatories (NSAIDS) such as Advil, Aleve , Ibuprofen, Motrin, Naproxen , Naprosyn  and Aspirin based products such as Excedrin, Goody's Powder, BC Powder. Stop ANY OVER THE COUNTER supplements until after surgery.  You may however, continue to take Tylenol  if needed for pain up until the day of surgery.  Continue taking all of your other prescription medications up until the day of surgery.  ON THE DAY OF SURGERY ONLY TAKE THESE MEDICATIONS WITH SIPS OF WATER: -  No Alcohol for 24 hours before or after surgery.  No Smoking including e-cigarettes for 24 hours before surgery.  No chewable tobacco products for at least 6 hours before surgery.  No nicotine patches on the day of surgery.  Do not use any "recreational" drugs for at least a week (preferably 2 weeks) before your surgery.  Please be advised that the combination of cocaine and anesthesia may have negative outcomes, up to and including death. If you test positive for cocaine, your surgery will be cancelled.  On the morning of surgery brush your teeth with toothpaste and water, you may rinse your mouth with mouthwash if you wish. Do not swallow any toothpaste or  mouthwash.  Use CHG Soap or wipes as directed on instruction sheet.  Do not wear jewelry, make-up, hairpins, clips or nail polish.  For welded (permanent) jewelry: bracelets, anklets, waist bands, etc.  Please have this removed prior to surgery.  If it is not removed, there is a chance that hospital personnel will need to cut it off on the day of surgery.  Do not wear lotions, powders, or perfumes.   Do not shave body hair from the neck down 48 hours before surgery.  Contact lenses, hearing aids and dentures may not be worn into surgery.  Do not bring valuables to the hospital. [facility] is not responsible for any missing/lost belongings or valuables.   Total Shoulder Arthroplasty:  use Benzoyl Peroxide 5% Gel as directed on instruction sheet.  Bring your C-PAP to the hospital in case you may have to spend the night.   Notify your doctor if there is any change in your medical condition (cold, fever, infection).  Wear comfortable clothing (specific to your surgery type) to the hospital.  After surgery, you can help prevent lung complications by doing breathing exercises.  Take deep breaths and cough every 1-2 hours. Your doctor may order a device called an Incentive Spirometer to help you take deep breaths. When coughing or sneezing, hold a pillow firmly against your incision with both hands. This is called "splinting." Doing this helps protect your incision. It also decreases belly discomfort.  If you are being admitted to the hospital overnight, leave your suitcase in the car. After surgery it may be  brought to your room.  In case of increased patient census, it may be necessary for you, the patient, to continue your postoperative care in the Same Day Surgery department.  If you are being discharged the day of surgery, you will not be allowed to drive home. You will need a responsible individual to drive you home and stay with you for 24 hours after surgery.   If you are taking  public transportation, you will need to have a responsible individual with you.  Please call the Pre-admissions Testing Dept. at [phone] if you have any questions about these instructions.  Surgery Visitation Policy:  Patients having surgery or a procedure may have two visitors.  Children under the age of 53 must have an adult with them who is not the patient.  Inpatient Visitation:    Visiting hours are 7 a.m. to 8 p.m. Up to four visitors are allowed at one time in a patient room. The visitors may rotate out with other people during the day.  One visitor age 55 or older may stay with the patient overnight and must be in the room by 8 p.m.

## 2023-05-22 NOTE — Progress Notes (Signed)
 Called over to [employer] to get pts MAR's for pts upcoming surgery with Dr Estanislao Heimlich. Called and spoke with Bridgette Campus at [facility] and she will fax over

## 2023-05-23 ENCOUNTER — Other Ambulatory Visit: Payer: Self-pay

## 2023-05-23 NOTE — Progress Notes (Addendum)
 Received MARS from [employer]. Updated meds in epic. Spoke with Hettie Lota at [facility] who was informed pt was needing labs and EKG. Time set up for 4-28 @ 1030. [employer] will bring her to appt in PAT

## 2023-05-23 NOTE — Progress Notes (Signed)
 Called back over to [employer] I never received the MARS as requested from yesterday. Spoke with Hettie Lota who will fax over MARS now

## 2023-05-26 ENCOUNTER — Encounter
Admission: RE | Admit: 2023-05-26 | Discharge: 2023-05-26 | Disposition: A | Discharge status: Home / Self Care | Source: Ambulatory Visit | Attending: Urology | Admitting: Urology

## 2023-05-26 DIAGNOSIS — I10 Essential (primary) hypertension: Secondary | ICD-10-CM | POA: Diagnosis not present

## 2023-05-26 DIAGNOSIS — Z01818 Encounter for other preprocedural examination: Secondary | ICD-10-CM | POA: Insufficient documentation

## 2023-05-26 DIAGNOSIS — Z01812 Encounter for preprocedural laboratory examination: Secondary | ICD-10-CM

## 2023-05-26 DIAGNOSIS — R9431 Abnormal electrocardiogram [ECG] [EKG]: Secondary | ICD-10-CM | POA: Insufficient documentation

## 2023-05-26 DIAGNOSIS — J449 Chronic obstructive pulmonary disease, unspecified: Secondary | ICD-10-CM | POA: Insufficient documentation

## 2023-05-26 DIAGNOSIS — Z01810 Encounter for preprocedural cardiovascular examination: Secondary | ICD-10-CM | POA: Diagnosis not present

## 2023-05-26 LAB — CBC
HCT: 33.9 % — ABNORMAL LOW (ref 36.0–46.0)
Hemoglobin: 10.7 g/dL — ABNORMAL LOW (ref 12.0–15.0)
MCH: 27.9 pg (ref 26.0–34.0)
MCHC: 31.6 g/dL (ref 30.0–36.0)
MCV: 88.5 fL (ref 80.0–100.0)
Platelets: 361 10*3/uL (ref 150–400)
RBC: 3.83 MIL/uL — ABNORMAL LOW (ref 3.87–5.11)
RDW: 15.4 % (ref 11.5–15.5)
WBC: 9.3 10*3/uL (ref 4.0–10.5)
nRBC: 0 % (ref 0.0–0.2)

## 2023-05-26 LAB — BASIC METABOLIC PANEL WITH GFR
Anion gap: 7 (ref 5–15)
BUN: 19 mg/dL (ref 8–23)
CO2: 26 mmol/L (ref 22–32)
Calcium: 8.7 mg/dL — ABNORMAL LOW (ref 8.9–10.3)
Chloride: 100 mmol/L (ref 98–111)
Creatinine, Ser: 0.85 mg/dL (ref 0.44–1.00)
GFR, Estimated: >60 mL/min (ref >60)
Glucose, Bld: 85 mg/dL (ref 70–99)
Potassium: 3.8 mmol/L (ref 3.5–5.1)
Sodium: 133 mmol/L — ABNORMAL LOW (ref 135–145)

## 2023-05-26 NOTE — Progress Notes (Signed)
 Called and spoke with [name]  who is pts daughter and her [facility] POA. [name]  will be bringing her mom from [place] [facility] to surgery and will also bring the [facility] POA paperwork to the hospital

## 2023-05-29 NOTE — Progress Notes (Signed)
 Faxed over surgery instructions to [employer]. Fax confirmation confirmed. Called [employer] and spoke with [place] who received instructions. Bridgette Campus verbalized understanding about pt being NPO after midnight tonight and for pt to use all her inhalers at facility before coming to the hospital

## 2023-05-30 ENCOUNTER — Ambulatory Visit: Admitting: Anesthesiology

## 2023-05-30 ENCOUNTER — Ambulatory Visit: Payer: Self-pay | Admitting: Urgent Care

## 2023-05-30 ENCOUNTER — Other Ambulatory Visit: Payer: Self-pay

## 2023-05-30 ENCOUNTER — Ambulatory Visit
Admission: RE | Admit: 2023-05-30 | Discharge: 2023-05-30 | Disposition: A | Discharge status: Home / Self Care | Attending: Urology | Admitting: Urology

## 2023-05-30 ENCOUNTER — Encounter: Payer: Self-pay | Admitting: Urology

## 2023-05-30 ENCOUNTER — Ambulatory Visit

## 2023-05-30 ENCOUNTER — Encounter
Admission: RE | Disposition: A | Discharge status: Home / Self Care | Payer: Self-pay | Source: Home / Self Care | Attending: Urology

## 2023-05-30 DIAGNOSIS — Z66 Do not resuscitate: Secondary | ICD-10-CM | POA: Insufficient documentation

## 2023-05-30 DIAGNOSIS — J449 Chronic obstructive pulmonary disease, unspecified: Secondary | ICD-10-CM | POA: Insufficient documentation

## 2023-05-30 DIAGNOSIS — Z87891 Personal history of nicotine dependence: Secondary | ICD-10-CM | POA: Diagnosis not present

## 2023-05-30 DIAGNOSIS — Z01812 Encounter for preprocedural laboratory examination: Secondary | ICD-10-CM

## 2023-05-30 DIAGNOSIS — I129 Hypertensive chronic kidney disease with stage 1 through stage 4 chronic kidney disease, or unspecified chronic kidney disease: Secondary | ICD-10-CM | POA: Insufficient documentation

## 2023-05-30 DIAGNOSIS — Z87440 Personal history of urinary (tract) infections: Secondary | ICD-10-CM | POA: Insufficient documentation

## 2023-05-30 DIAGNOSIS — Z9221 Personal history of antineoplastic chemotherapy: Secondary | ICD-10-CM | POA: Diagnosis not present

## 2023-05-30 DIAGNOSIS — Z85828 Personal history of other malignant neoplasm of skin: Secondary | ICD-10-CM | POA: Insufficient documentation

## 2023-05-30 DIAGNOSIS — N135 Crossing vessel and stricture of ureter without hydronephrosis: Secondary | ICD-10-CM | POA: Insufficient documentation

## 2023-05-30 DIAGNOSIS — N138 Other obstructive and reflux uropathy: Secondary | ICD-10-CM | POA: Diagnosis not present

## 2023-05-30 DIAGNOSIS — Z01810 Encounter for preprocedural cardiovascular examination: Secondary | ICD-10-CM

## 2023-05-30 DIAGNOSIS — N201 Calculus of ureter: Secondary | ICD-10-CM | POA: Diagnosis present

## 2023-05-30 DIAGNOSIS — I10 Essential (primary) hypertension: Secondary | ICD-10-CM

## 2023-05-30 DIAGNOSIS — F0390 Unspecified dementia without behavioral disturbance: Secondary | ICD-10-CM | POA: Insufficient documentation

## 2023-05-30 DIAGNOSIS — N189 Chronic kidney disease, unspecified: Secondary | ICD-10-CM | POA: Insufficient documentation

## 2023-05-30 HISTORY — PX: CYSTOSCOPY W/ URETERAL STENT PLACEMENT: SHX1429

## 2023-05-30 SURGERY — CYSTOSCOPY, FLEXIBLE, WITH STENT REPLACEMENT
Anesthesia: General | Site: Bladder | Laterality: Left

## 2023-05-30 MED ORDER — PROPOFOL 500 MG/50ML IV EMUL
INTRAVENOUS | Status: DC | PRN
  Administered 2023-05-30: 75 ug/kg/min via INTRAVENOUS

## 2023-05-30 MED ORDER — ACETAMINOPHEN 500 MG PO TABS
1000.0000 mg | ORAL_TABLET | Freq: Once | ORAL | Status: AC
  Administered 2023-05-30: 1000 mg via ORAL

## 2023-05-30 MED ORDER — CEFAZOLIN SODIUM-DEXTROSE 2-4 GM/100ML-% IV SOLN
INTRAVENOUS | Status: AC
  Filled 2023-05-30: qty 100

## 2023-05-30 MED ORDER — CEPHALEXIN 500 MG PO CAPS: 500.0000 mg | ORAL_CAPSULE | Freq: Two times a day (BID) | ORAL | 0 refills | Status: DC

## 2023-05-30 MED ORDER — CHLORHEXIDINE GLUCONATE 0.12 % MT SOLN
OROMUCOSAL | Status: AC
  Filled 2023-05-30: qty 15

## 2023-05-30 MED ORDER — ACETAMINOPHEN 500 MG PO TABS
ORAL_TABLET | ORAL | Status: AC
  Filled 2023-05-30: qty 2

## 2023-05-30 MED ORDER — SODIUM CHLORIDE 0.9 % IR SOLN
Status: DC | PRN
  Administered 2023-05-30: 1000 mL

## 2023-05-30 MED ORDER — LIDOCAINE HCL (CARDIAC) PF 100 MG/5ML IV SOSY
PREFILLED_SYRINGE | INTRAVENOUS | Status: DC | PRN
  Administered 2023-05-30: 20 mg via INTRAVENOUS

## 2023-05-30 MED ORDER — PROPOFOL 10 MG/ML IV BOLUS
INTRAVENOUS | Status: AC
  Filled 2023-05-30: qty 20

## 2023-05-30 MED ORDER — LACTATED RINGERS IV SOLN: INTRAVENOUS | Status: DC

## 2023-05-30 MED ORDER — CEFAZOLIN SODIUM-DEXTROSE 2-4 GM/100ML-% IV SOLN
2.0000 g | INTRAVENOUS | Status: AC
  Administered 2023-05-30: 2 g via INTRAVENOUS

## 2023-05-30 MED ORDER — CHLORHEXIDINE GLUCONATE 0.12 % MT SOLN
15.0000 mL | Freq: Once | OROMUCOSAL | Status: AC
  Administered 2023-05-30: 15 mL via OROMUCOSAL

## 2023-05-30 MED ORDER — ORAL CARE MOUTH RINSE: 15.0000 mL | Freq: Once | OROMUCOSAL | Status: AC

## 2023-05-30 SURGICAL SUPPLY — 18 items
BAG DRAIN SIEMENS DORNER NS (MISCELLANEOUS) ×1 IMPLANT
BRUSH SCRUB EZ 4% CHG (MISCELLANEOUS) IMPLANT
CATH URETL OPEN 5X70 (CATHETERS) ×1 IMPLANT
GLOVE BIOGEL PI IND STRL 7.5 (GLOVE) ×1 IMPLANT
GOWN STRL REUS W/ TWL LRG LVL3 (GOWN DISPOSABLE) ×1 IMPLANT
GOWN STRL REUS W/ TWL XL LVL3 (GOWN DISPOSABLE) ×1 IMPLANT
GUIDEWIRE STR DUAL SENSOR (WIRE) ×1 IMPLANT
KIT TURNOVER CYSTO (KITS) ×1 IMPLANT
PACK CYSTO AR (MISCELLANEOUS) ×1 IMPLANT
SET CYSTO W/LG BORE CLAMP LF (SET/KITS/TRAYS/PACK) ×1 IMPLANT
SOL .9 NS 3000ML IRR UROMATIC (IV SOLUTION) ×1 IMPLANT
STENT URET 6FRX24 CONTOUR (STENTS) IMPLANT
STENT URET 6FRX26 CONTOUR (STENTS) IMPLANT
STENT URO INLAY 6FRX24CM (STENTS) IMPLANT
SURGILUBE 2OZ TUBE FLIPTOP (MISCELLANEOUS) ×1 IMPLANT
SYRINGE TOOMEY IRRIG 70ML (MISCELLANEOUS) IMPLANT
WATER STERILE IRR 1000ML POUR (IV SOLUTION) ×1 IMPLANT
WATER STERILE IRR 500ML POUR (IV SOLUTION) ×1 IMPLANT

## 2023-05-30 NOTE — H&P (Signed)
 05/30/23 12:03 PM   Monica  H Singh [birth year] [id number]  CC: Left UPJ obstruction  HPI: Frail 89 year old female with chronic left UPJ obstruction managed with yearly stent changes.  Here today for routine stent change.  Denies any UTI symptoms today.   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-stage 3, cd 20 positive; status post 6 cycles of R-CHOP   DNR (do not resuscitate)    Dyspnea    with exertion   Essential hypertension 04/10/2022   Herpes zoster without complication 01/31/2009   History of hiatal hernia    HOH (hard of hearing)    wears hearing aides   Hypothyroidism    Mitral regurgitation    Non Hodgkin's lymphoma (HCC)    Nursing home resident    Obstruction of left ureteropelvic junction (UPJ)    Personal history of chemotherapy    PONV (postoperative nausea and vomiting)    in January lasted about 4 hours   Recurrent UTI    Squamous cell carcinoma of skin 11/09/2008   Right post. lat. elbow. SCCis arising in [place]. Excised 01/04/2009, margins free.    Squamous cell carcinoma of skin 07/26/2020   right hand   Syncope     Surgical History: Past Surgical History:  Procedure Laterality Date   ABDOMINAL SURGERY  2009   abdominal mass+ NH lymphoma,   BACK SURGERY  01/2017   fusion. metal plate in neck at back   CATARACT EXTRACTION  1997   right eye and left ey   cervical neck fusion  1995   CYSTOSCOPY W/ RETROGRADES Left 01/30/2018   Procedure: CYSTOSCOPY WITH RETROGRADE PYELOGRAM;  Surgeon: Lawerence Pressman, MD;  Location: [facility];  Service: Urology;  Laterality: Left;   CYSTOSCOPY W/ RETROGRADES Left 08/05/2018   Procedure: CYSTOSCOPY WITH RETROGRADE  PYELOGRAM;  Surgeon: Lawerence Pressman, MD;  Location: [facility];  Service: Urology;  Laterality: Left;   CYSTOSCOPY W/ RETROGRADES Left 06/01/2021   Procedure: CYSTOSCOPY WITH RETROGRADE PYELOGRAM;  Surgeon: Lawerence Pressman, MD;  Location: [facility];  Service: Urology;  Laterality: Left;   CYSTOSCOPY W/ URETERAL STENT PLACEMENT Left 08/05/2018   Procedure: CYSTOSCOPY WITH STENT Exchange;  Surgeon: Lawerence Pressman, MD;  Location: [facility];  Service: Urology;  Laterality: Left;   CYSTOSCOPY W/ URETERAL STENT PLACEMENT Left 05/21/2019   Procedure: CYSTOSCOPY WITH STENT REPLACEMENT;  Surgeon: Lawerence Pressman, MD;  Location: [facility];  Service: Urology;  Laterality: Left;   CYSTOSCOPY W/ URETERAL STENT PLACEMENT Left 05/05/2020   Procedure: CYSTOSCOPY WITH RETROGRADE PYELOGRAM/URETERAL STENT EXCHANGE;  Surgeon: Lawerence Pressman, MD;  Location: [facility];  Service: Urology;  Laterality: Left;   CYSTOSCOPY W/ URETERAL STENT PLACEMENT Left 06/01/2021   Procedure: CYSTOSCOPY WITH STENT EXCHANGE;  Surgeon: Lawerence Pressman, MD;  Location: [facility];  Service: Urology;  Laterality: Left;   CYSTOSCOPY WITH BIOPSY Left 02/27/2018   Procedure: CYSTOSCOPY WITH BIOPSY;  Surgeon: Lawerence Pressman, MD;  Location: [facility];  Service: Urology;  Laterality: Left;   CYSTOSCOPY WITH STENT PLACEMENT Left 01/30/2018   Procedure: CYSTOSCOPY WITH STENT PLACEMENT;  Surgeon: Lawerence Pressman, MD;  Location:  [facility];  Service: Urology;  Laterality: Left;   CYSTOSCOPY WITH STENT PLACEMENT Left 07/12/2022   Procedure: CYSTOSCOPY WITH STENT EXCHANGE;  Surgeon: Lawerence Pressman, MD;  Location: [facility];  Service: Urology;  Laterality: Left;   CYSTOSCOPY WITH URETEROSCOPY AND STENT PLACEMENT Left 02/27/2018   Procedure: CYSTOSCOPY WITH URETEROSCOPY AND STENT Exchange;  Surgeon: Lawerence Pressman, MD;  Location: [facility];  Service: Urology;  Laterality: Left;   DENTAL SURGERY     screws   JOINT REPLACEMENT Left    knee   KNEE ARTHROPLASTY  Right 12/23/2018   Procedure: RIGHT COMPUTER ASSISTED TOTAL KNEE ARTHROPLASTY;  Surgeon: Arlyne Lame, MD;  Location: [facility];  Service: Orthopedics;  Laterality: Right;   KYPHOPLASTY N/A 02/21/2017   Procedure: [id number];  Surgeon: Molli Angelucci, MD;  Location: [facility];  Service: Orthopedics;  Laterality: N/A;   laparotomy with biopsy  03/02/2007   PORTACATH PLACEMENT  2009   SPINE SURGERY  1997   SQUAMOUS CELL CARCINOMA EXCISION     right arm   TOTAL KNEE ARTHROPLASTY Left    2009   URETEROSCOPY Left 01/30/2018   Procedure: URETEROSCOPY;  Surgeon: Lawerence Pressman, MD;  Location: [facility];  Service: Urology;  Laterality: Left;   VAGINAL HYSTERECTOMY  1971      Family History: Family History  Problem Relation Age of Onset   Breast cancer Sister    Dementia Sister    Cataracts Sister    Heart attack Brother    CAD Brother    Heart attack Brother    Leukemia Grandchild        granddaughter   Kidney disease Neg Hx    Bladder Cancer Neg Hx     Social History:  reports that she quit smoking about 33 years ago. Her smoking use included cigarettes. She has been exposed to tobacco smoke. She has never used smokeless tobacco. She reports that she does not currently use alcohol. She reports that she does not use drugs.  Physical Exam: BP (!) 141/64   Pulse 91   Temp 97.9 F (36.6 C) (Temporal)   Resp 16   Ht 5\' 2"  (1.575 m)   Wt 65.3 kg   SpO2 97%   BMI 26.34 kg/m    Constitutional:  Alert and oriented, No acute distress. Cardiovascular: Regular rate and rhythm Respiratory: Clear to auscultation bilaterally GI: Abdomen is soft, nontender, nondistended, no abdominal masses   Assessment & Plan:   Frail 89 year old female with chronic left UPJ obstruction managed with yearly stent changes.  She has done well with the is with decrease in her flank pain and UTIs.  Also remains on prophylactic Keflex  which has decreased her infections.  We reviewed risks including  bleeding, infection, stent erosion, stent related symptoms.  Cystoscopy and left ureteral stent change today    Jay Meth, MD 05/30/2023  [facility] [address], [address] [place], [place] [address] [phone]

## 2023-05-30 NOTE — Anesthesia Preprocedure Evaluation (Addendum)
 Anesthesia Evaluation  Patient identified by MRN, date of birth, ID band Patient awake    Reviewed: Allergy & Precautions, NPO status , Patient's Chart, lab work & pertinent test results  History of Anesthesia Complications (+) PONV and history of anesthetic complications  Airway Mallampati: III  TM Distance: <3 FB Neck ROM: full    Dental  (+) Upper Dentures   Pulmonary shortness of breath and with exertion, COPD,  COPD inhaler, Patient abstained from smoking., former smoker Recent hx of bronchitus   Pulmonary exam normal breath sounds clear to auscultation       Cardiovascular Exercise Tolerance: Poor hypertension, Pt. on medications Normal cardiovascular exam+ Valvular Problems/Murmurs MR  Rhythm:Regular Rate:Normal     Neuro/Psych  PSYCHIATRIC DISORDERS     Dementia  Neuromuscular disease    GI/Hepatic Neg liver ROS, hiatal hernia,,,  Endo/Other  negative endocrine ROS    Renal/GU Renal disease  negative genitourinary   Musculoskeletal  (+) Arthritis ,    Abdominal Normal abdominal exam  (+)   Peds negative pediatric ROS (+)  Hematology negative hematology ROS (+)   Anesthesia Other Findings Past Medical History: No date: Actinic keratosis 06/03/2014: Adnexal mass     Comment:  since 2009 No date: Arthritis No date: Chronic kidney disease No date: Complication of anesthesia No date: COPD (chronic obstructive pulmonary disease) (HCC) No date: Dementia (HCC) 01/2007: 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)     Comment:  NON-HODGKINS-stage 3, cd 20 positive; status post 6               cycles of R-CHOP No date: Dyspnea     Comment:  with exertion 04/10/2022: Essential hypertension 01/31/2009: Herpes zoster without complication No date: History of hiatal hernia No date: HOH (hard of hearing)     Comment:  wears hearing aides No date: Mitral regurgitation No date: Non Hodgkin's lymphoma  (HCC) No date: Personal history of chemotherapy No date: PONV (postoperative nausea and vomiting)     Comment:  in January lasted about 4 hours No date: Recurrent UTI 11/09/2008: Squamous cell carcinoma of skin     Comment:  Right post. lat. elbow. SCCis arising in [place]. Excised               01/04/2009, margins free.  07/26/2020: Squamous cell carcinoma of skin     Comment:  right hand No date: Syncope  Past Surgical History: 2009: ABDOMINAL SURGERY     Comment:  abdominal mass+ NH lymphoma, 01/2017: BACK SURGERY     Comment:  fusion. metal plate in neck at back 1997: CATARACT EXTRACTION     Comment:  right eye and left ey 1995: cervical neck fusion 01/30/2018: CYSTOSCOPY W/ RETROGRADES; Left     Comment:  Procedure: CYSTOSCOPY WITH RETROGRADE PYELOGRAM;                Surgeon: Lawerence Pressman, MD;  Location: [facility];                Service: Urology;  Laterality: Left; 08/05/2018: CYSTOSCOPY W/ RETROGRADES; Left     Comment:  Procedure: CYSTOSCOPY WITH RETROGRADE PYELOGRAM;                Surgeon: Lawerence Pressman, MD;  Location: [facility];                Service: Urology;  Laterality: Left; 06/01/2021: CYSTOSCOPY W/ RETROGRADES; Left     Comment:  Procedure: CYSTOSCOPY WITH RETROGRADE PYELOGRAM;  Surgeon: Lawerence Pressman, MD;  Location: [facility];                Service: Urology;  Laterality: Left; 08/05/2018: CYSTOSCOPY W/ URETERAL STENT PLACEMENT; Left     Comment:  Procedure: CYSTOSCOPY WITH STENT Exchange;  Surgeon:               Lawerence Pressman, MD;  Location: [facility];  Service:               Urology;  Laterality: Left; 05/21/2019: CYSTOSCOPY W/ URETERAL STENT PLACEMENT; Left     Comment:  Procedure: CYSTOSCOPY WITH STENT REPLACEMENT;  Surgeon:               Lawerence Pressman, MD;  Location: [facility];  Service:               Urology;  Laterality: Left; 05/05/2020: CYSTOSCOPY W/ URETERAL STENT PLACEMENT; Left     Comment:  Procedure: CYSTOSCOPY WITH RETROGRADE  PYELOGRAM/URETERAL              STENT EXCHANGE;  Surgeon: Lawerence Pressman, MD;                Location: [facility];  Service: Urology;  Laterality: Left; 06/01/2021: CYSTOSCOPY W/ URETERAL STENT PLACEMENT; Left     Comment:  Procedure: CYSTOSCOPY WITH STENT EXCHANGE;  Surgeon:               Lawerence Pressman, MD;  Location: [facility];  Service:               Urology;  Laterality: Left; 02/27/2018: CYSTOSCOPY WITH BIOPSY; Left     Comment:  Procedure: CYSTOSCOPY WITH BIOPSY;  Surgeon: Lawerence Pressman, MD;  Location: [facility];  Service: Urology;                Laterality: Left; 01/30/2018: CYSTOSCOPY WITH STENT PLACEMENT; Left     Comment:  Procedure: CYSTOSCOPY WITH STENT PLACEMENT;  Surgeon:               Lawerence Pressman, MD;  Location: [facility];  Service:               Urology;  Laterality: Left; 02/27/2018: CYSTOSCOPY WITH URETEROSCOPY AND STENT PLACEMENT; Left     Comment:  Procedure: CYSTOSCOPY WITH URETEROSCOPY AND STENT               Exchange;  Surgeon: Lawerence Pressman, MD;  Location: [facility];  Service: Urology;  Laterality: Left; No date: DENTAL SURGERY     Comment:  screws No date: JOINT REPLACEMENT; Left     Comment:  knee 12/23/2018: KNEE ARTHROPLASTY; Right     Comment:  Procedure: RIGHT COMPUTER ASSISTED TOTAL KNEE               ARTHROPLASTY;  Surgeon: Arlyne Lame, MD;  Location:               [facility];  Service: Orthopedics;  Laterality: Right; 02/21/2017: KYPHOPLASTY; N/A     Comment:  Procedure: [id number];  Surgeon: Molli Angelucci, MD;               Location: [facility];  Service: Orthopedics;  Laterality:               N/A;  03/02/2007: laparotomy with biopsy 2009: PORTACATH PLACEMENT 1997: SPINE SURGERY No date: SQUAMOUS CELL CARCINOMA EXCISION     Comment:  right arm No date: TOTAL KNEE ARTHROPLASTY; Left     Comment:  2009 01/30/2018: URETEROSCOPY; Left     Comment:  Procedure: URETEROSCOPY;  Surgeon: Lawerence Pressman, MD;               Location: [facility];  Service: Urology;  Laterality: Left; 1971: VAGINAL HYSTERECTOMY  BMI    Body Mass Index: 24.98 kg/m      Reproductive/Obstetrics negative OB ROS                             Anesthesia Physical Anesthesia Plan  ASA: 3  Anesthesia Plan: General   Post-op Pain Management: Tylenol  PO (pre-op)*   Induction: Intravenous  PONV Risk Score and Plan: Propofol  infusion and TIVA  Airway Management Planned: Simple Face Mask  Additional Equipment:   Intra-op Plan:   Post-operative Plan:   Informed Consent: I have reviewed the patients History and Physical, chart, labs and discussed the procedure including the risks, benefits and alternatives for the proposed anesthesia with the patient or authorized representative who has indicated his/her understanding and acceptance.   Patient has DNR.  Continue DNR.   Dental Advisory Given and Consent reviewed with POA  Plan Discussed with: CRNA and Surgeon  Anesthesia Plan Comments: (Daughter agrees with temporary intubation if needed and defib/cardiovert if needed. Wishes to avoid CPR)       Anesthesia Quick Evaluation

## 2023-05-30 NOTE — Anesthesia Postprocedure Evaluation (Signed)
 Anesthesia Post Note  Patient: Monica  H Singh  Procedure(s) Performed: CYSTOSCOPY, FLEXIBLE, WITH STENT REPLACEMENT (Left: Bladder)  Patient location during evaluation: PACU Anesthesia Type: General Level of consciousness: awake and alert Pain management: pain level controlled Vital Signs Assessment: post-procedure vital signs reviewed and stable Respiratory status: spontaneous breathing, nonlabored ventilation and respiratory function stable Cardiovascular status: blood pressure returned to baseline and stable Postop Assessment: no apparent nausea or vomiting Anesthetic complications: no   No notable events documented.   Last Vitals:  Vitals:   05/30/23 1311 05/30/23 1325  BP: 138/66 (!) 159/71  Pulse: 83 88  Resp: 15 18  Temp: 37.2 C   SpO2: 100% 100%    Last Pain:  Vitals:   05/30/23 1325  TempSrc:   PainSc: 0-No pain                 Baltazar Bonier

## 2023-05-30 NOTE — Transfer of Care (Signed)
 Immediate Anesthesia Transfer of Care Note  Patient: Monica Singh  Procedure(s) Performed: CYSTOSCOPY, FLEXIBLE, WITH STENT REPLACEMENT (Left: Bladder)  Patient Location: PACU  Anesthesia Type:MAC  Level of Consciousness: awake  Airway & Oxygen Therapy: Patient Spontanous Breathing  Post-op Assessment: Report given to RN and Post -op Vital signs reviewed and stable  Post vital signs: Reviewed and stable  Last Vitals:  Vitals Value Taken Time  BP 135/68 05/30/23 1247  Temp    Pulse 79 05/30/23 1249  Resp 23 05/30/23 1249  SpO2 98 % 05/30/23 1249  Vitals shown include unfiled device data.  Last Pain:  Vitals:   05/30/23 1131  TempSrc: Temporal  PainSc: 0-No pain      Patients Stated Pain Goal: 0 (05/30/23 1131)  Complications: No notable events documented.

## 2023-05-30 NOTE — Op Note (Signed)
 Date of procedure: 05/30/23  Preoperative diagnosis:  Left UPJ obstruction  Postoperative diagnosis:  Same  Procedure: Cystoscopy, left ureteral stent change  Surgeon: Jay Meth, MD  Anesthesia: General  Complications: None  Intraoperative findings:  Normal bladder, uncomplicated left ureteral stent change  EBL: None  Specimens: None  Drains: Left 6 French by 26 cm Bard Optima ureteral stent  Indication: Justise  H Hambric is a 89 y.o. patient with chronic left UPJ obstruction, no evidence of malignancy on thorough investigation, with left kidney managed with yearly stent changes.  After reviewing the management options for treatment, they elected to proceed with the above surgical procedure(s). We have discussed the potential benefits and risks of the procedure, side effects of the proposed treatment, the likelihood of the patient achieving the goals of the procedure, and any potential problems that might occur during the procedure or recuperation. Informed consent has been obtained.  Description of procedure:  The patient was taken to the operating room and MAC induced. SCDs were placed for DVT prophylaxis. The patient was placed in the dorsal lithotomy position, prepped and draped in the usual sterile fashion, and preoperative antibiotics were administered. A preoperative time-out was performed.   A 21 French rigid cystoscope was used to intubate the urethra and thorough cystoscopy showed no suspicious lesions.  There was moderate encrustation of the distal end of the stent.  A sensor wire passed easily alongside the stent up to the left kidney under fluoroscopic vision. The stent was grasped and removed in its entirety.  The rigid cystoscope was backloaded over the wire, and a 6 [place] by 26 cm Bard Optima stent was uneventfully placed with a curl over the location of the renal pelvis, as well as in the bladder under direct vision.  Urine drained through the sideports of the  stent.  The bladder was drained and this concluded our procedure.  Disposition: Stable to PACU  Plan: Follow-up in clinic in 9 months to discuss ongoing yearly stent changes  Jay Meth, MD

## 2023-05-31 ENCOUNTER — Encounter: Payer: Self-pay | Admitting: Urology

## 2023-06-14 ENCOUNTER — Inpatient Hospital Stay
Admission: EM | Admit: 2023-06-14 | Discharge: 2023-06-17 | DRG: 291 | Disposition: A | Discharge status: Skilled Nursing Facility | Source: Skilled Nursing Facility | Attending: Internal Medicine | Admitting: Internal Medicine

## 2023-06-14 ENCOUNTER — Other Ambulatory Visit: Payer: Self-pay

## 2023-06-14 ENCOUNTER — Emergency Department

## 2023-06-14 DIAGNOSIS — Z79899 Other long term (current) drug therapy: Secondary | ICD-10-CM

## 2023-06-14 DIAGNOSIS — Z66 Do not resuscitate: Secondary | ICD-10-CM | POA: Diagnosis present

## 2023-06-14 DIAGNOSIS — Z981 Arthrodesis status: Secondary | ICD-10-CM

## 2023-06-14 DIAGNOSIS — N1831 Chronic kidney disease, stage 3a: Secondary | ICD-10-CM | POA: Diagnosis present

## 2023-06-14 DIAGNOSIS — N390 Urinary tract infection, site not specified: Secondary | ICD-10-CM | POA: Diagnosis present

## 2023-06-14 DIAGNOSIS — I130 Hypertensive heart and chronic kidney disease with heart failure and stage 1 through stage 4 chronic kidney disease, or unspecified chronic kidney disease: Secondary | ICD-10-CM | POA: Diagnosis not present

## 2023-06-14 DIAGNOSIS — I2510 Atherosclerotic heart disease of native coronary artery without angina pectoris: Secondary | ICD-10-CM | POA: Diagnosis present

## 2023-06-14 DIAGNOSIS — Z9842 Cataract extraction status, left eye: Secondary | ICD-10-CM

## 2023-06-14 DIAGNOSIS — Z9221 Personal history of antineoplastic chemotherapy: Secondary | ICD-10-CM

## 2023-06-14 DIAGNOSIS — Z87440 Personal history of urinary (tract) infections: Secondary | ICD-10-CM

## 2023-06-14 DIAGNOSIS — Z9841 Cataract extraction status, right eye: Secondary | ICD-10-CM

## 2023-06-14 DIAGNOSIS — Z881 Allergy status to other antibiotic agents status: Secondary | ICD-10-CM

## 2023-06-14 DIAGNOSIS — F0390 Unspecified dementia without behavioral disturbance: Secondary | ICD-10-CM | POA: Diagnosis present

## 2023-06-14 DIAGNOSIS — Z87891 Personal history of nicotine dependence: Secondary | ICD-10-CM

## 2023-06-14 DIAGNOSIS — Z1611 Resistance to penicillins: Secondary | ICD-10-CM | POA: Diagnosis present

## 2023-06-14 DIAGNOSIS — J9601 Acute respiratory failure with hypoxia: Secondary | ICD-10-CM | POA: Diagnosis present

## 2023-06-14 DIAGNOSIS — N189 Chronic kidney disease, unspecified: Secondary | ICD-10-CM

## 2023-06-14 DIAGNOSIS — F03C0 Unspecified dementia, severe, without behavioral disturbance, psychotic disturbance, mood disturbance, and anxiety: Secondary | ICD-10-CM | POA: Diagnosis present

## 2023-06-14 DIAGNOSIS — Z85828 Personal history of other malignant neoplasm of skin: Secondary | ICD-10-CM

## 2023-06-14 DIAGNOSIS — E871 Hypo-osmolality and hyponatremia: Secondary | ICD-10-CM | POA: Diagnosis present

## 2023-06-14 DIAGNOSIS — E876 Hypokalemia: Secondary | ICD-10-CM | POA: Diagnosis present

## 2023-06-14 DIAGNOSIS — Z96653 Presence of artificial knee joint, bilateral: Secondary | ICD-10-CM | POA: Diagnosis present

## 2023-06-14 DIAGNOSIS — Z91011 Allergy to milk products: Secondary | ICD-10-CM

## 2023-06-14 DIAGNOSIS — Z8249 Family history of ischemic heart disease and other diseases of the circulatory system: Secondary | ICD-10-CM

## 2023-06-14 DIAGNOSIS — Z882 Allergy status to sulfonamides status: Secondary | ICD-10-CM

## 2023-06-14 DIAGNOSIS — B9620 Unspecified Escherichia coli [E. coli] as the cause of diseases classified elsewhere: Secondary | ICD-10-CM | POA: Diagnosis present

## 2023-06-14 DIAGNOSIS — Z90710 Acquired absence of both cervix and uterus: Secondary | ICD-10-CM

## 2023-06-14 DIAGNOSIS — I5032 Chronic diastolic (congestive) heart failure: Secondary | ICD-10-CM

## 2023-06-14 DIAGNOSIS — I5033 Acute on chronic diastolic (congestive) heart failure: Secondary | ICD-10-CM | POA: Diagnosis present

## 2023-06-14 DIAGNOSIS — E785 Hyperlipidemia, unspecified: Secondary | ICD-10-CM | POA: Diagnosis present

## 2023-06-14 DIAGNOSIS — C859A Non-Hodgkin lymphoma, unspecified, in remission: Secondary | ICD-10-CM | POA: Diagnosis present

## 2023-06-14 DIAGNOSIS — J441 Chronic obstructive pulmonary disease with (acute) exacerbation: Secondary | ICD-10-CM | POA: Diagnosis not present

## 2023-06-14 DIAGNOSIS — E039 Hypothyroidism, unspecified: Secondary | ICD-10-CM | POA: Diagnosis present

## 2023-06-14 DIAGNOSIS — Z91040 Latex allergy status: Secondary | ICD-10-CM

## 2023-06-14 LAB — CBC WITH DIFFERENTIAL/PLATELET
Abs Immature Granulocytes: 0.06 10*3/uL (ref 0.00–0.07)
Basophils Absolute: 0.1 10*3/uL (ref 0.0–0.1)
Basophils Relative: 1 %
Eosinophils Absolute: 0.5 10*3/uL (ref 0.0–0.5)
Eosinophils Relative: 5 %
HCT: 37.6 % (ref 36.0–46.0)
Hemoglobin: 11.9 g/dL — ABNORMAL LOW (ref 12.0–15.0)
Immature Granulocytes: 1 %
Lymphocytes Relative: 33 %
Lymphs Abs: 3.7 10*3/uL (ref 0.7–4.0)
MCH: 28.6 pg (ref 26.0–34.0)
MCHC: 31.6 g/dL (ref 30.0–36.0)
MCV: 90.4 fL (ref 80.0–100.0)
Monocytes Absolute: 0.9 10*3/uL (ref 0.1–1.0)
Monocytes Relative: 8 %
Neutro Abs: 6.1 10*3/uL (ref 1.7–7.7)
Neutrophils Relative %: 52 %
Platelets: 335 10*3/uL (ref 150–400)
RBC: 4.16 MIL/uL (ref 3.87–5.11)
RDW: 15.6 % — ABNORMAL HIGH (ref 11.5–15.5)
WBC: 11.3 10*3/uL — ABNORMAL HIGH (ref 4.0–10.5)
nRBC: 0 % (ref 0.0–0.2)

## 2023-06-14 LAB — TROPONIN I (HIGH SENSITIVITY)
Troponin I (High Sensitivity): 6 ng/L (ref <18)
Troponin I (High Sensitivity): 6 ng/L (ref <18)

## 2023-06-14 LAB — COMPREHENSIVE METABOLIC PANEL WITH GFR
ALT: 14 U/L (ref 0–44)
AST: 18 U/L (ref 15–41)
Albumin: 3.8 g/dL (ref 3.5–5.0)
Alkaline Phosphatase: 73 U/L (ref 38–126)
Anion gap: 10 (ref 5–15)
BUN: 22 mg/dL (ref 8–23)
CO2: 24 mmol/L (ref 22–32)
Calcium: 8.8 mg/dL — ABNORMAL LOW (ref 8.9–10.3)
Chloride: 97 mmol/L — ABNORMAL LOW (ref 98–111)
Creatinine, Ser: 0.92 mg/dL (ref 0.44–1.00)
GFR, Estimated: 58 mL/min — ABNORMAL LOW (ref >60)
Glucose, Bld: 99 mg/dL (ref 70–99)
Potassium: 4 mmol/L (ref 3.5–5.1)
Sodium: 131 mmol/L — ABNORMAL LOW (ref 135–145)
Total Bilirubin: 0.6 mg/dL (ref 0.0–1.2)
Total Protein: 7 g/dL (ref 6.5–8.1)

## 2023-06-14 LAB — MAGNESIUM: Magnesium: 1.9 mg/dL (ref 1.7–2.4)

## 2023-06-14 MED ORDER — IPRATROPIUM-ALBUTEROL 0.5-2.5 (3) MG/3ML IN SOLN
6.0000 mL | Freq: Once | RESPIRATORY_TRACT | Status: AC
  Administered 2023-06-14: 6 mL via RESPIRATORY_TRACT
  Filled 2023-06-14: qty 6

## 2023-06-14 MED ORDER — METHYLPREDNISOLONE SODIUM SUCC 125 MG IJ SOLR
125.0000 mg | Freq: Once | INTRAMUSCULAR | Status: AC
  Administered 2023-06-14: 125 mg via INTRAVENOUS
  Filled 2023-06-14: qty 2

## 2023-06-14 MED ORDER — METHYLPREDNISOLONE SODIUM SUCC 40 MG IJ SOLR
40.0000 mg | Freq: Two times a day (BID) | INTRAMUSCULAR | Status: AC
  Administered 2023-06-14 – 2023-06-15 (×2): 40 mg via INTRAVENOUS
  Filled 2023-06-14 (×2): qty 1

## 2023-06-14 MED ORDER — GUAIFENESIN ER 600 MG PO TB12
1200.0000 mg | ORAL_TABLET | Freq: Two times a day (BID) | ORAL | Status: DC
  Administered 2023-06-14 – 2023-06-17 (×6): 1200 mg via ORAL
  Filled 2023-06-14 (×7): qty 2

## 2023-06-14 MED ORDER — PSYLLIUM 95 % PO PACK
1.0000 | PACK | Freq: Every day | ORAL | Status: DC
  Administered 2023-06-15 – 2023-06-17 (×3): 1 via ORAL
  Filled 2023-06-14 (×3): qty 1

## 2023-06-14 MED ORDER — PREDNISONE 20 MG PO TABS
40.0000 mg | ORAL_TABLET | Freq: Every day | ORAL | Status: DC
  Administered 2023-06-16 – 2023-06-17 (×2): 40 mg via ORAL
  Filled 2023-06-14 (×2): qty 2

## 2023-06-14 MED ORDER — ALBUTEROL SULFATE (2.5 MG/3ML) 0.083% IN NEBU: 2.5000 mg | INHALATION_SOLUTION | Freq: Four times a day (QID) | RESPIRATORY_TRACT | Status: DC | PRN

## 2023-06-14 MED ORDER — SENNOSIDES-DOCUSATE SODIUM 8.6-50 MG PO TABS
1.0000 | ORAL_TABLET | Freq: Every day | ORAL | Status: DC
  Administered 2023-06-15 – 2023-06-17 (×3): 1 via ORAL
  Filled 2023-06-14 (×3): qty 1

## 2023-06-14 MED ORDER — HEPARIN SODIUM (PORCINE) 5000 UNIT/ML IJ SOLN
5000.0000 [IU] | Freq: Two times a day (BID) | INTRAMUSCULAR | Status: DC
  Administered 2023-06-14 – 2023-06-17 (×6): 5000 [IU] via SUBCUTANEOUS
  Filled 2023-06-14 (×7): qty 1

## 2023-06-14 MED ORDER — IPRATROPIUM BROMIDE 0.02 % IN SOLN
0.5000 mg | RESPIRATORY_TRACT | Status: DC
  Administered 2023-06-14 – 2023-06-15 (×3): 0.5 mg via RESPIRATORY_TRACT
  Filled 2023-06-14 (×5): qty 2.5

## 2023-06-14 MED ORDER — ACETAMINOPHEN 500 MG PO TABS
1000.0000 mg | ORAL_TABLET | Freq: Two times a day (BID) | ORAL | Status: DC
  Administered 2023-06-14 – 2023-06-17 (×6): 1000 mg via ORAL
  Filled 2023-06-14 (×6): qty 2

## 2023-06-14 MED ORDER — ONDANSETRON HCL 4 MG/2ML IJ SOLN
4.0000 mg | Freq: Four times a day (QID) | INTRAMUSCULAR | Status: DC | PRN
Start: 2023-06-14 — End: 2023-06-17

## 2023-06-14 MED ORDER — TIOTROPIUM BROMIDE MONOHYDRATE 18 MCG IN CAPS: 18.0000 ug | ORAL_CAPSULE | Freq: Every day | RESPIRATORY_TRACT | Status: DC

## 2023-06-14 MED ORDER — FUROSEMIDE 10 MG/ML IJ SOLN
20.0000 mg | Freq: Two times a day (BID) | INTRAMUSCULAR | Status: DC
  Administered 2023-06-14 – 2023-06-15 (×3): 20 mg via INTRAVENOUS
  Filled 2023-06-14 (×3): qty 2

## 2023-06-14 MED ORDER — ONDANSETRON HCL 4 MG PO TABS: 4.0000 mg | ORAL_TABLET | Freq: Four times a day (QID) | ORAL | Status: DC | PRN

## 2023-06-14 MED ORDER — SODIUM CHLORIDE 0.9 % IV SOLN
500.0000 mg | INTRAVENOUS | Status: AC
  Administered 2023-06-14: 500 mg via INTRAVENOUS
  Filled 2023-06-14: qty 5

## 2023-06-14 MED ORDER — BUDESON-GLYCOPYRROL-FORMOTEROL 160-9-4.8 MCG/ACT IN AERO
2.0000 | INHALATION_SPRAY | Freq: Two times a day (BID) | RESPIRATORY_TRACT | Status: DC
Start: 2023-06-14 — End: 2023-06-17
  Administered 2023-06-14 – 2023-06-17 (×6): 2 via RESPIRATORY_TRACT
  Filled 2023-06-14: qty 5.9

## 2023-06-14 MED ORDER — IPRATROPIUM BROMIDE 0.02 % IN SOLN: 0.5000 mg | RESPIRATORY_TRACT | Status: DC | PRN

## 2023-06-14 MED ORDER — AZITHROMYCIN 500 MG PO TABS
500.0000 mg | ORAL_TABLET | Freq: Every day | ORAL | Status: AC
  Administered 2023-06-15 – 2023-06-16 (×2): 500 mg via ORAL
  Filled 2023-06-14 (×2): qty 1

## 2023-06-14 MED ORDER — TRAMADOL HCL 50 MG PO TABS
50.0000 mg | ORAL_TABLET | Freq: Four times a day (QID) | ORAL | Status: DC | PRN
  Administered 2023-06-16: 50 mg via ORAL
  Filled 2023-06-14: qty 1

## 2023-06-14 MED ORDER — IPRATROPIUM-ALBUTEROL 0.5-2.5 (3) MG/3ML IN SOLN
6.0000 mL | Freq: Once | RESPIRATORY_TRACT | Status: AC
  Administered 2023-06-14: 6 mL via RESPIRATORY_TRACT
  Filled 2023-06-14: qty 3

## 2023-06-14 NOTE — ED Provider Notes (Signed)
 [facility] Provider Note    Event Date/Time   First MD Initiated Contact with Patient 06/14/23 1146     (approximate)   History   Cough   HPI  Monica  H Reny is a 89 y.o. female who presents to the ED for evaluation of Cough   Review urology clinic visit from 4/10.  History of left idiopathic UPJ obstruction and severe hydronephrosis, recurrent UTIs.  Uteroscopy's with concerning-appearing lesions but negative cytologies.  Subsequently undergone annual left ureteral stent changes.  Remains on Keflex  250 mg for urine infectious prophylaxis. On 5/2, 2 weeks ago, she had left side ureteral stent exchange, uncomplicated. Otherwise she has a history of COPD, HTN, dementia  Patient presents to the ED from her local facility for evaluation of about 12-24 hours of an acute cough.  She is here with 2 of her daughters.  1 daughter saw her yesterday and spent about an hour and a half with her and only heard "1 cough."  Facility reported increased cough overnight and this morning.  Here in the ED, patient reports feeling "raspy" without other symptoms or concerns.  No chest pain, dyspnea, fevers, abdominal pain or emesis.  No syncope or falls.  Physical Exam   Triage Vital Signs: ED Triage Vitals  Encounter Vitals Group     BP      Systolic BP Percentile      Diastolic BP Percentile      Pulse      Resp      Temp      Temp src      SpO2      Weight      Height      Head Circumference      Peak Flow      Pain Score      Pain Loc      Pain Education      Exclude from Growth Chart     Most recent vital signs: Vitals:   06/14/23 1150 06/14/23 1342  BP: (!) 142/74 127/64  Pulse: 93 96  Resp: 19 (!) 22  Temp: 98.4 F (36.9 C)   SpO2: 100% 100%    General: Awake, no distress.  CV:  Good peripheral perfusion.  Resp:  Minimal tachypnea to the low 20s, no distress.  Wheezing throughout and slightly decreased airflow.  No focal features. Abd:  No  distention.  MSK:  No deformity noted.  Trace pitting edema to bilateral lower extremities Neuro:  No focal deficits appreciated. Other:     ED Results / Procedures / Treatments   Labs (all labs ordered are listed, but only abnormal results are displayed) Labs Reviewed  COMPREHENSIVE METABOLIC PANEL WITH GFR - Abnormal; Notable for the following components:      Result Value   Sodium 131 (*)    Chloride 97 (*)    Calcium  8.8 (*)    GFR, Estimated 58 (*)    All other components within normal limits  CBC WITH DIFFERENTIAL/PLATELET - Abnormal; Notable for the following components:   WBC 11.3 (*)    Hemoglobin 11.9 (*)    RDW 15.6 (*)    All other components within normal limits  MAGNESIUM   URINALYSIS, ROUTINE W REFLEX MICROSCOPIC  TROPONIN I (HIGH SENSITIVITY)  TROPONIN I (HIGH SENSITIVITY)    EKG Sinus rhythm with a rate of 99 bpm.  Normal axis and intervals without clear signs of acute ischemia.  Nonspecific changes.  Signs of LVH.  RADIOLOGY  CXR interpreted by me without evidence of acute cardiopulmonary pathology.  Official radiology report(s): DG Chest 2 View Result Date: 06/14/2023 CLINICAL DATA:  Productive cough for 1 week. EXAM: CHEST - 2 VIEW COMPARISON:  January 19, 2023. FINDINGS: Stable cardiomediastinal silhouette. No acute pulmonary disease is noted. Bony thorax is unremarkable. IMPRESSION: No active cardiopulmonary disease. Electronically Signed   By: Rosalene Colon M.D.   On: 06/14/2023 12:22    PROCEDURES and INTERVENTIONS:  .1-3 Lead EKG Interpretation  Performed by: Arline Bennett, MD Authorized by: Arline Bennett, MD     Interpretation: normal     ECG rate:  94   ECG rate assessment: normal     Rhythm: sinus rhythm     Ectopy: Singh     Conduction: normal     Medications  methylPREDNISolone  sodium succinate (SOLU-MEDROL ) 125 mg/2 mL injection 125 mg (125 mg Intravenous Given 06/14/23 1249)  ipratropium-albuterol  (DUONEB) 0.5-2.5 (3) MG/3ML  nebulizer solution 6 mL (6 mLs Nebulization Given 06/14/23 1247)  ipratropium-albuterol  (DUONEB) 0.5-2.5 (3) MG/3ML nebulizer solution 6 mL (6 mLs Nebulization Given 06/14/23 1403)     IMPRESSION / MDM / ASSESSMENT AND PLAN / ED COURSE  I reviewed the triage vital signs and the nursing notes.  Differential diagnosis includes, but is not limited to, ACS, PTX, PNA, muscle strain/spasm, PE, dissection, anxiety, pleural effusion  {Patient presents with symptoms of an acute illness or injury that is potentially life-threatening.  89 year old woman with history of COPD presents to the ED with an acute cough and evidence of a COPD exacerbation.  No infiltrates on x-ray.  Will start her on steroids, provide breathing treatments and check labs.   Serum workup is reassuring with negative troponin, normal renal function.  Mild hyponatremia.  Mild leukocytosis but no clear infectious etiology of her symptoms or indications for antibiotics at this point.  Despite multiple rounds of breathing treatments and starting her on steroids she has persistent wheezing and cough.  Will consult with medicine for admission  Clinical Course as of 06/14/23 1444  Sat Jun 14, 2023  1346 Reassessed.  Discussed reassuring blood work, clear x-ray.  Reexamined she still has persistent wheezing and reports not feeling much better.  We discussed a repeat nebulizer, reassessment and possible need for observation admission.  They are agreeable. [DS]  1444 Reassessed.  Still wheezing.  Discussed plan of care and recommend admission, she is agreeable.  Will consult with hospitalist [DS]    Clinical Course User Index [DS] Arline Bennett, MD     FINAL CLINICAL IMPRESSION(S) / ED DIAGNOSES   Final diagnoses:  COPD exacerbation (HCC)     Rx / DC Orders   ED Discharge Orders     Singh        Note:  This document was prepared using Dragon voice recognition software and may include unintentional dictation errors.    Arline Bennett, MD 06/14/23 [phone]

## 2023-06-14 NOTE — H&P (Signed)
 History and Physical    Cathe  AVRI PAIVA WUJ:[id number] DOB: 1929-01-19 DOA: 06/14/2023  PCP: Lamon Pillow, MD (Confirm with patient/family/NH records and if not entered, this has to be entered at [facility] point of entry) Patient coming from: SNF  I have personally briefly reviewed patient's old medical records in [facility]  Chief Complaint: Cough, wheezing, shortness of breath  HPI: Monica Singh is a 89 y.o. female with medical history significant of COPD Gold stage I, mitral regurgitation, CKD stage IIIa, dementia, non-Hodgkin's lymphoma in remission, HTN, hypothyroidism, left UPJ obstruction status post stenting recently, sent from nursing home for new onset of cough wheezing shortness of breath.  Symptoms started last night patient started to cough up whitish phlegm along with wheezing and shortness of breath, denied any sore throat runny nose.  She also noticed some increased of ankle swelling.  At baseline she uses roller walker to ambulate and has had poor exercise tolerance and walking inside the room causing shortness of breath.  ED Course: Afebrile, borderline tachycardia heart rate 120/60 O2 saturation 100% room air.  Chest x-ray negative for acute infiltrates, WBC 11.3 hemoglobin 11.9 BUN 22 creatinine 0.9 glucose 99 K4.0.  Patient was given IV Solu-Medrol  x 1, 3 rounds of DuoNebs but continued to have wheezing and increasing breathing effort.  Review of Systems: As per HPI otherwise 14 point review of systems negative.    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   NON-HODGKINS-stage 3, cd 20 positive; status post 6 cycles of R-CHOP   DNR (do not resuscitate)    Dyspnea    with  exertion   Essential hypertension 04/10/2022   Herpes zoster without complication 01/31/2009   History of hiatal hernia    HOH (hard of hearing)    wears hearing aides   Hypothyroidism    Mitral regurgitation    Non Hodgkin's lymphoma (HCC)    Nursing home resident    Obstruction of left ureteropelvic junction (UPJ)    Personal history of chemotherapy    PONV (postoperative nausea and vomiting)    in January lasted about 4 hours   Recurrent UTI    Squamous cell carcinoma of skin 11/09/2008   Right post. lat. elbow. SCCis arising in AK. Excised 01/04/2009, margins free.    Squamous cell carcinoma of skin 07/26/2020   right hand   Syncope     Past Surgical History:  Procedure Laterality Date   ABDOMINAL SURGERY  2009   abdominal mass+ NH lymphoma,   BACK SURGERY  01/2017   fusion. metal plate in neck at back   CATARACT EXTRACTION  1997   right eye and left ey   cervical neck fusion  1995   CYSTOSCOPY W/ RETROGRADES Left 01/30/2018   Procedure: CYSTOSCOPY WITH RETROGRADE PYELOGRAM;  Surgeon: Lawerence Pressman, MD;  Location: [facility];  Service: Urology;  Laterality: Left;   CYSTOSCOPY W/ RETROGRADES Left 08/05/2018   Procedure: CYSTOSCOPY WITH RETROGRADE PYELOGRAM;  Surgeon: Lawerence Pressman, MD;  Location: [facility];  Service: Urology;  Laterality: Left;   CYSTOSCOPY W/ RETROGRADES Left 06/01/2021   Procedure: CYSTOSCOPY WITH RETROGRADE PYELOGRAM;  Surgeon: Jay Meth  C, MD;  Location: [facility];  Service: Urology;  Laterality: Left;   CYSTOSCOPY W/ URETERAL STENT PLACEMENT Left 08/05/2018   Procedure: CYSTOSCOPY WITH STENT Exchange;  Surgeon: Lawerence Pressman, MD;  Location: [facility];  Service: Urology;  Laterality: Left;   CYSTOSCOPY W/ URETERAL STENT PLACEMENT Left 05/21/2019   Procedure: CYSTOSCOPY WITH STENT REPLACEMENT;  Surgeon: Lawerence Pressman, MD;  Location: [facility];  Service: Urology;  Laterality: Left;   CYSTOSCOPY W/ URETERAL STENT PLACEMENT Left 05/05/2020   Procedure:  CYSTOSCOPY WITH RETROGRADE PYELOGRAM/URETERAL STENT EXCHANGE;  Surgeon: Lawerence Pressman, MD;  Location: [facility];  Service: Urology;  Laterality: Left;   CYSTOSCOPY W/ URETERAL STENT PLACEMENT Left 06/01/2021   Procedure: CYSTOSCOPY WITH STENT EXCHANGE;  Surgeon: Lawerence Pressman, MD;  Location: [facility];  Service: Urology;  Laterality: Left;   CYSTOSCOPY W/ URETERAL STENT PLACEMENT Left 05/30/2023   Procedure: CYSTOSCOPY, FLEXIBLE, WITH STENT REPLACEMENT;  Surgeon: Lawerence Pressman, MD;  Location: [facility];  Service: Urology;  Laterality: Left;   CYSTOSCOPY WITH BIOPSY Left 02/27/2018   Procedure: CYSTOSCOPY WITH BIOPSY;  Surgeon: Lawerence Pressman, MD;  Location: [facility];  Service: Urology;  Laterality: Left;   CYSTOSCOPY WITH STENT PLACEMENT Left 01/30/2018   Procedure: CYSTOSCOPY WITH STENT PLACEMENT;  Surgeon: Lawerence Pressman, MD;  Location: [facility];  Service: Urology;  Laterality: Left;   CYSTOSCOPY WITH STENT PLACEMENT Left 07/12/2022   Procedure: CYSTOSCOPY WITH STENT EXCHANGE;  Surgeon: Lawerence Pressman, MD;  Location: [facility];  Service: Urology;  Laterality: Left;   CYSTOSCOPY WITH URETEROSCOPY AND STENT PLACEMENT Left 02/27/2018   Procedure: CYSTOSCOPY WITH URETEROSCOPY AND STENT Exchange;  Surgeon: Lawerence Pressman, MD;  Location: [facility];  Service: Urology;  Laterality: Left;   DENTAL SURGERY     screws   JOINT REPLACEMENT Left    knee   KNEE ARTHROPLASTY Right 12/23/2018   Procedure: RIGHT COMPUTER ASSISTED TOTAL KNEE ARTHROPLASTY;  Surgeon: Arlyne Lame, MD;  Location: [facility];  Service: Orthopedics;  Laterality: Right;   KYPHOPLASTY N/A 02/21/2017   Procedure: [id number];  Surgeon: Molli Angelucci, MD;  Location: [facility];  Service: Orthopedics;  Laterality: N/A;   laparotomy with biopsy  03/02/2007   PORTACATH PLACEMENT  2009   SPINE SURGERY  1997   SQUAMOUS CELL CARCINOMA EXCISION     right arm   TOTAL KNEE ARTHROPLASTY Left    2009   URETEROSCOPY Left 01/30/2018    Procedure: URETEROSCOPY;  Surgeon: Lawerence Pressman, MD;  Location: [facility];  Service: Urology;  Laterality: Left;   VAGINAL HYSTERECTOMY  1971     reports that she quit smoking about 34 years ago. Her smoking use included cigarettes. She has been exposed to tobacco smoke. She has never used smokeless tobacco. She reports that she does not currently use alcohol. She reports that she does not use drugs.  Allergies  Allergen Reactions   Ciprofloxacin  Itching    Whelps on face reported 03/16/2021   Milk-Related Compounds Diarrhea and Nausea And Vomiting    Gas too   Latex Itching   Misc. Sulfonamide Containing Compounds    Milk (Cow) Diarrhea and Nausea And Vomiting    Milk allergy    Sulfa Antibiotics Hives and Itching    Family History  Problem Relation Age of Onset   Breast cancer Sister    Dementia Sister    Cataracts Sister    Heart attack Brother    CAD Brother  Heart attack Brother    Leukemia Grandchild        granddaughter   Kidney disease Neg Hx    Bladder Cancer Neg Hx      Prior to Admission medications   Medication Sig Start Date End Date Taking? Authorizing Provider  acetaminophen  (TYLENOL ) 500 MG tablet Take 1,000 mg by mouth 2 (two) times daily.    [provider]  albuterol  (VENTOLIN  HFA) 108 (90 Base) MCG/ACT inhaler Inhale 2 puffs into the lungs every 6 (six) hours as needed for wheezing or shortness of breath. 10/04/22   Vergia Glasgow, MD  Budeson-Glycopyrrol-Formoterol  (BREZTRI  AEROSPHERE) 160-9-4.8 MCG/ACT AERO Inhale 2 puffs into the lungs in the morning and at bedtime. 07/01/22   Vergia Glasgow, MD  cephALEXin  (KEFLEX ) 500 MG capsule Take 1 capsule (500 mg total) by mouth 2 (two) times daily. 05/30/23   Lawerence Pressman, MD  cholecalciferol (VITAMIN D3) 25 MCG (1000 UNIT) tablet Take 1,000 Units by mouth daily.    [provider]  ferrous sulfate  325 (65 FE) MG tablet Take 325 mg by mouth daily with breakfast.    [provider]   folic acid  (FOLVITE ) 400 MCG tablet Take 400 mcg by mouth daily.    [provider]  ipratropium (ATROVENT) 0.02 % nebulizer solution Take 0.5 mg by nebulization every 8 (eight) hours as needed for shortness of breath (cough).    [provider]  methenamine  (HIPREX ) 1 g tablet TAKE ONE (1) TABLET BY MOUTH TWO TIMES PER DAY 11/14/22   McGowan, Cathleen Coach A, [name]  psyllium (REGULOID) 0.52 g capsule Take 0.52 g by mouth daily.    [provider]  Saccharomyces boulardii (FLORASTOR PO) Take 2 capsules by mouth daily.    [provider]  senna-docusate (SENOKOT-S) 8.6-50 MG tablet Take 1 tablet by mouth daily.    [provider]  Spacer/Aero-Holding Chambers (AEROCHAMBER MV) inhaler Use as instructed 10/04/22   Vergia Glasgow, MD  SPIRIVA  HANDIHALER 18 MCG inhalation capsule Place 18 mcg into inhaler and inhale daily. 09/24/22   [provider]  traMADol  (ULTRAM ) 50 MG tablet Take 50 mg by mouth every 6 (six) hours as needed for moderate pain (pain score 4-6).    [provider]    Physical Exam: Vitals:   06/14/23 1150 06/14/23 1151 06/14/23 1342  BP: (!) 142/74  127/64  Pulse: 93  96  Resp: 19  (!) 22  Temp: 98.4 F (36.9 C)    TempSrc: Axillary    SpO2: 100%  100%  Weight:  65.3 kg   Height:  5\' 2"  (1.575 m)     Constitutional: NAD, calm, comfortable Vitals:   06/14/23 1150 06/14/23 1151 06/14/23 1342  BP: (!) 142/74  127/64  Pulse: 93  96  Resp: 19  (!) 22  Temp: 98.4 F (36.9 C)    TempSrc: Axillary    SpO2: 100%  100%  Weight:  65.3 kg   Height:  5\' 2"  (1.575 m)    Eyes: PERRL, lids and conjunctivae normal ENMT: Mucous membranes are moist. Posterior pharynx clear of any exudate or lesions.Normal dentition.  Neck: normal, supple, no masses, no thyromegaly Respiratory: clear to auscultation bilaterally, diffused wheezing, fine crackles on bilateral lower fields, increasing respiratory effort. No accessory muscle use.   Cardiovascular: Regular rate and rhythm, no murmurs / rubs / gallops.  2+ extremity edema. 2+ pedal pulses. No carotid bruits.  Abdomen: no tenderness, no masses palpated. No hepatosplenomegaly. Bowel sounds positive.  Musculoskeletal: no clubbing / cyanosis. No joint deformity upper and lower extremities. Good ROM, no contractures. Normal muscle tone.  Skin: no rashes, lesions, ulcers. No induration Neurologic: CN 2-12 grossly intact. Sensation intact, DTR normal. Strength 5/5 in all 4.  Psychiatric: Normal judgment and insight. Alert and oriented x 3. Normal mood.     Labs on Admission: I have personally reviewed following labs and imaging studies  CBC: Recent Labs  Lab 06/14/23 1243  WBC 11.3*  NEUTROABS 6.1  HGB 11.9*  HCT 37.6  MCV 90.4  PLT 335   Basic Metabolic Panel: Recent Labs  Lab 06/14/23 1243  NA 131*  K 4.0  CL 97*  CO2 24  GLUCOSE 99  BUN 22  CREATININE 0.92  CALCIUM  8.8*  MG 1.9   GFR: Estimated Creatinine Clearance: 34.6 mL/min (by C-G formula based on SCr of 0.92 mg/dL). Liver Function Tests: Recent Labs  Lab 06/14/23 1243  AST 18  ALT 14  ALKPHOS 73  BILITOT 0.6  PROT 7.0  ALBUMIN 3.8   No results for input(s): "LIPASE", "AMYLASE" in the last 168 hours. No results for input(s): "AMMONIA" in the last 168 hours. Coagulation Profile: No results for input(s): "INR", "PROTIME" in the last 168 hours. Cardiac Enzymes: No results for input(s): "CKTOTAL", "CKMB", "CKMBINDEX", "TROPONINI" in the last 168 hours. BNP (last 3 results) No results for input(s): "PROBNP" in the last 8760 hours. HbA1C: No results for input(s): "HGBA1C" in the last 72 hours. CBG: No results for input(s): "GLUCAP" in the last 168 hours. Lipid Profile: No results for input(s): "CHOL", "HDL", "LDLCALC", "TRIG", "CHOLHDL", "LDLDIRECT" in the last 72 hours. Thyroid  Function Tests: No results for input(s): "TSH", "T4TOTAL", "FREET4", "T3FREE", "THYROIDAB" in the last 72  hours. Anemia Panel: No results for input(s): "VITAMINB12", "FOLATE", "FERRITIN", "TIBC", "IRON", "RETICCTPCT" in the last 72 hours. Urine analysis:    Component Value Date/Time   COLORURINE YELLOW (A) 11/16/2022 1929   APPEARANCEUR Cloudy (A) 05/08/2023 1332   LABSPEC 1.016 11/16/2022 1929   LABSPEC 1.010 01/09/2013 0647   PHURINE 5.0 11/16/2022 1929   GLUCOSEU Negative 05/08/2023 1332   GLUCOSEU Negative 01/09/2013 0647   HGBUR SMALL (A) 11/16/2022 1929   BILIRUBINUR Negative 05/08/2023 1332   BILIRUBINUR Negative 01/09/2013 0647   KETONESUR NEGATIVE 11/16/2022 1929   PROTEINUR 3+ (A) 05/08/2023 1332   PROTEINUR 30 (A) 11/16/2022 1929   UROBILINOGEN 0.2 04/09/2022 1452   NITRITE Positive (A) 05/08/2023 1332   NITRITE POSITIVE (A) 11/16/2022 1929   LEUKOCYTESUR 3+ (A) 05/08/2023 1332   LEUKOCYTESUR LARGE (A) 11/16/2022 1929   LEUKOCYTESUR 2+ 01/09/2013 0647    Radiological Exams on Admission: DG Chest 2 View Result Date: 06/14/2023 CLINICAL DATA:  Productive cough for 1 week. EXAM: CHEST - 2 VIEW COMPARISON:  January 19, 2023. FINDINGS: Stable cardiomediastinal silhouette. No acute pulmonary disease is noted. Bony thorax is unremarkable. IMPRESSION: No active cardiopulmonary disease. Electronically Signed   By: Rosalene Colon M.D.   On: 06/14/2023 12:22    EKG: Independently reviewed.  Sinus tachycardia, no acute ST changes.  Assessment/Plan Principal Problem:   COPD exacerbation (HCC) Active Problems:   COPD with acute exacerbation (HCC)   Acute on chronic diastolic CHF (congestive heart failure) (HCC)  (please populate well all problems here in Problem List. (For example, if patient is on BP meds at home and you resume or decide to hold them, it is a problem that needs to be her. Same for CAD, COPD, HLD and so  on)  Acute hypoxic respiratory failure Acute COPD exacerbation - Continue IV Solu-Medrol  x 2 then switch to p.o. prednisone  - ICS and LABA - Atrovent  nebulizer and Spiriva  - Incentive spirometry and flutter valve - Short course of azithromycin , culture sputum -Check atypical pathogen study mycoplasma and Legionella.  Given the sudden onset of cough and COPD exacerbation, will check respiratory panel. - Other DDx, appears to have mild fluid overload, no history of CHF.  Review of patient's past cardiac workup showed echocardiogram 5 years ago showed normal LVEF and mild to moderate MR and mild TR.  Given the patient also has had worsening of exercise tolerance, will give IV diuresis.  Recheck echocardiogram.  Acute, probably on chronic HFpEF decompensation - As above - IV Lasix 20 mg twice daily  Deconditioning PT evaluation-  CKD stage IIIa - Fluid overload - Creatinine level stable - On IV diuresis  DVT prophylaxis: Heparin  subcu Code Status: DNR Family Communication: Daughters at bedside Disposition Plan: Expect less than 2 midnight hospital stay Consults called: None Admission status: Telemetry observation   [place] MD [facility] Pager [phone]  06/14/2023, 3:09 PM

## 2023-06-14 NOTE — ED Notes (Signed)
 Dr. Katrinka Blazing at bedside.

## 2023-06-14 NOTE — ED Notes (Signed)
 ..ED TO INPATIENT HANDOFF REPORT  ED Nurse Name and Phone #: Normand Beckwith [phone]  S Name/Age/Gender Monica  H Singh 89 y.o. female Room/Bed: ED44A/ED44A  Code Status   Code Status: Limited: Do not attempt resuscitation (DNR) -DNR-LIMITED -Do Not Intubate/DNI   Home/SNF/Other Nursing Home Patient oriented to: self Is this baseline? Yes   Triage Complete: Triage complete  Chief Complaint COPD exacerbation (HCC) [J44.1]  Triage Note PER EMS: pt arrives from [facility] with c/o productive cough x 1 week. Initial RA sat was 95%. EMS did administer one duo-neb  and the patient did use her inhaler prior to arrival. Denies pain, no fever. She has hx of dementia and is at baseline mentation: A&O to self. Daughter at bedside and states usually around this time every year this happens and she has pneumonia.   BP- 153/114, HR-80, RR-16   Allergies Allergies  Allergen Reactions   Ciprofloxacin  Itching    Whelps on face reported 03/16/2021   Milk-Related Compounds Diarrhea and Nausea And Vomiting    Gas too   Latex Itching   Misc. Sulfonamide Containing Compounds    Milk (Cow) Diarrhea and Nausea And Vomiting    Milk allergy    Sulfa Antibiotics Hives and Itching    Level of Care/Admitting Diagnosis ED Disposition     ED Disposition  Admit   Condition  --   Comment  Hospital [facility]: [facility] [[id number]]  Level of Care: Telemetry Medical [104]  Covid Evaluation: Asymptomatic - no recent exposure (last 10 days) testing not required  Diagnosis: COPD exacerbation [facility]) [[id number]]  Admitting Physician: [place] [[id number]]  Attending Physician: [place] [[id number]]          B Medical/Surgery History Past Medical History:  Diagnosis Date   Actinic keratosis    Adnexal mass 06/03/2014   since 2009   Arthritis    Chronic kidney disease    Complication of anesthesia    COPD (chronic obstructive pulmonary disease) (HCC)     Dementia (HCC)    Diaphragmatic hernia    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   NON-HODGKINS-stage 3, cd 20 positive; status post 6 cycles of R-CHOP   DNR (do not resuscitate)    Dyspnea    with exertion   Essential hypertension 04/10/2022   Herpes zoster without complication 01/31/2009   History of hiatal hernia    HOH (hard of hearing)    wears hearing aides   Hypothyroidism    Mitral regurgitation    Non Hodgkin's lymphoma (HCC)    Nursing home resident    Obstruction of left ureteropelvic junction (UPJ)    Personal history of chemotherapy    PONV (postoperative nausea and vomiting)    in January lasted about 4 hours   Recurrent UTI    Squamous cell carcinoma of skin 11/09/2008   Right post. lat. elbow. SCCis arising in AK. Excised 01/04/2009, margins free.    Squamous cell carcinoma of skin 07/26/2020   right hand   Syncope    Past Surgical History:  Procedure Laterality Date   ABDOMINAL SURGERY  2009   abdominal mass+ NH lymphoma,   BACK SURGERY  01/2017   fusion. metal plate in neck at back   CATARACT EXTRACTION  1997   right eye and left ey   cervical neck fusion  1995   CYSTOSCOPY W/ RETROGRADES Left 01/30/2018   Procedure: CYSTOSCOPY WITH RETROGRADE PYELOGRAM;  Surgeon: Lawerence Pressman, MD;  Location:  [facility];  Service: Urology;  Laterality: Left;   CYSTOSCOPY W/ RETROGRADES Left 08/05/2018   Procedure: CYSTOSCOPY WITH RETROGRADE PYELOGRAM;  Surgeon: Lawerence Pressman, MD;  Location: [facility];  Service: Urology;  Laterality: Left;   CYSTOSCOPY W/ RETROGRADES Left 06/01/2021   Procedure: CYSTOSCOPY WITH RETROGRADE PYELOGRAM;  Surgeon: Lawerence Pressman, MD;  Location: [facility];  Service: Urology;  Laterality: Left;   CYSTOSCOPY W/ URETERAL STENT PLACEMENT Left 08/05/2018   Procedure: CYSTOSCOPY WITH STENT Exchange;  Surgeon: Lawerence Pressman, MD;  Location: [facility];  Service: Urology;  Laterality: Left;   CYSTOSCOPY W/ URETERAL STENT PLACEMENT Left 05/21/2019    Procedure: CYSTOSCOPY WITH STENT REPLACEMENT;  Surgeon: Lawerence Pressman, MD;  Location: [facility];  Service: Urology;  Laterality: Left;   CYSTOSCOPY W/ URETERAL STENT PLACEMENT Left 05/05/2020   Procedure: CYSTOSCOPY WITH RETROGRADE PYELOGRAM/URETERAL STENT EXCHANGE;  Surgeon: Lawerence Pressman, MD;  Location: [facility];  Service: Urology;  Laterality: Left;   CYSTOSCOPY W/ URETERAL STENT PLACEMENT Left 06/01/2021   Procedure: CYSTOSCOPY WITH STENT EXCHANGE;  Surgeon: Lawerence Pressman, MD;  Location: [facility];  Service: Urology;  Laterality: Left;   CYSTOSCOPY W/ URETERAL STENT PLACEMENT Left 05/30/2023   Procedure: CYSTOSCOPY, FLEXIBLE, WITH STENT REPLACEMENT;  Surgeon: Lawerence Pressman, MD;  Location: [facility];  Service: Urology;  Laterality: Left;   CYSTOSCOPY WITH BIOPSY Left 02/27/2018   Procedure: CYSTOSCOPY WITH BIOPSY;  Surgeon: Lawerence Pressman, MD;  Location: [facility];  Service: Urology;  Laterality: Left;   CYSTOSCOPY WITH STENT PLACEMENT Left 01/30/2018   Procedure: CYSTOSCOPY WITH STENT PLACEMENT;  Surgeon: Lawerence Pressman, MD;  Location: [facility];  Service: Urology;  Laterality: Left;   CYSTOSCOPY WITH STENT PLACEMENT Left 07/12/2022   Procedure: CYSTOSCOPY WITH STENT EXCHANGE;  Surgeon: Lawerence Pressman, MD;  Location: [facility];  Service: Urology;  Laterality: Left;   CYSTOSCOPY WITH URETEROSCOPY AND STENT PLACEMENT Left 02/27/2018   Procedure: CYSTOSCOPY WITH URETEROSCOPY AND STENT Exchange;  Surgeon: Lawerence Pressman, MD;  Location: [facility];  Service: Urology;  Laterality: Left;   DENTAL SURGERY     screws   JOINT REPLACEMENT Left    knee   KNEE ARTHROPLASTY Right 12/23/2018   Procedure: RIGHT COMPUTER ASSISTED TOTAL KNEE ARTHROPLASTY;  Surgeon: Arlyne Lame, MD;  Location: [facility];  Service: Orthopedics;  Laterality: Right;   KYPHOPLASTY N/A 02/21/2017   Procedure: [id number];  Surgeon: Molli Angelucci, MD;  Location: [facility];  Service: Orthopedics;  Laterality: N/A;    laparotomy with biopsy  03/02/2007   PORTACATH PLACEMENT  2009   SPINE SURGERY  1997   SQUAMOUS CELL CARCINOMA EXCISION     right arm   TOTAL KNEE ARTHROPLASTY Left    2009   URETEROSCOPY Left 01/30/2018   Procedure: URETEROSCOPY;  Surgeon: Lawerence Pressman, MD;  Location: [facility];  Service: Urology;  Laterality: Left;   VAGINAL HYSTERECTOMY  1971     A IV Location/Drains/Wounds Patient Lines/Drains/Airways Status     Active Line/Drains/Airways     Name Placement date Placement time Site Days   Peripheral IV 06/14/23 20 G Right Antecubital 06/14/23  1244  Antecubital  less than 1   Ureteral Drain/Stent Left ureter 6 Fr. 05/30/23  1236  Left ureter  15            Intake/Output Last 24 hours No intake or output data in the 24 hours ending 06/14/23 1536  Labs/Imaging Results for orders placed or performed  during the hospital encounter of 06/14/23 (from the past 48 hours)  Comprehensive metabolic panel with GFR     Status: Abnormal   Collection Time: 06/14/23 12:43 PM  Result Value Ref Range   Sodium 131 (L) 135 - 145 mmol/L   Potassium 4.0 3.5 - 5.1 mmol/L   Chloride 97 (L) 98 - 111 mmol/L   CO2 24 22 - 32 mmol/L   Glucose, Bld 99 70 - 99 mg/dL    Comment: Glucose reference range applies only to samples taken after fasting for at least 8 hours.   BUN 22 8 - 23 mg/dL   Creatinine, Ser 1.61 0.44 - 1.00 mg/dL   Calcium  8.8 (L) 8.9 - 10.3 mg/dL   Total Protein 7.0 6.5 - 8.1 g/dL   Albumin 3.8 3.5 - 5.0 g/dL   AST 18 15 - 41 U/L   ALT 14 0 - 44 U/L   Alkaline Phosphatase 73 38 - 126 U/L   Total Bilirubin 0.6 0.0 - 1.2 mg/dL   GFR, Estimated 58 (L) >60 mL/min    Comment: (NOTE) Calculated using the CKD-EPI Creatinine Equation (2021)    Anion gap 10 5 - 15    Comment: Performed at [facility], [address] [place], [place] [address]  CBC with Differential/Platelet     Status: Abnormal   Collection Time: 06/14/23 12:43 PM  Result Value Ref Range   WBC 11.3  (H) 4.0 - 10.5 K/uL   RBC 4.16 3.87 - 5.11 MIL/uL   Hemoglobin 11.9 (L) 12.0 - 15.0 g/dL   HCT 54.0 98.1 - 19.1 %   MCV 90.4 80.0 - 100.0 fL   MCH 28.6 26.0 - 34.0 pg   MCHC 31.6 30.0 - 36.0 g/dL   RDW 47.8 (H) 29.5 - 62.1 %   Platelets 335 150 - 400 K/uL   nRBC 0.0 0.0 - 0.2 %   Neutrophils Relative % 52 %   Neutro Abs 6.1 1.7 - 7.7 K/uL   Lymphocytes Relative 33 %   Lymphs Abs 3.7 0.7 - 4.0 K/uL   Monocytes Relative 8 %   Monocytes Absolute 0.9 0.1 - 1.0 K/uL   Eosinophils Relative 5 %   Eosinophils Absolute 0.5 0.0 - 0.5 K/uL   Basophils Relative 1 %   Basophils Absolute 0.1 0.0 - 0.1 K/uL   Immature Granulocytes 1 %   Abs Immature Granulocytes 0.06 0.00 - 0.07 K/uL    Comment: Performed at [facility], [address] [place], [place] [address]  Troponin I (High Sensitivity)     Status: None   Collection Time: 06/14/23 12:43 PM  Result Value Ref Range   Troponin I (High Sensitivity) 6 <18 ng/L    Comment: (NOTE) Elevated high sensitivity troponin I (hsTnI) values and significant  changes across serial measurements may suggest ACS but many other  chronic and acute conditions are known to elevate hsTnI results.  Refer to the "Links" section for chest pain algorithms and additional  guidance. Performed at [facility], [address] [place], [place] [address]   Magnesium      Status: None   Collection Time: 06/14/23 12:43 PM  Result Value Ref Range   Magnesium  1.9 1.7 - 2.4 mg/dL    Comment: Performed at [facility], [address] [place], [place] [address]  Troponin I (High Sensitivity)     Status: None   Collection Time: 06/14/23  2:03 PM  Result Value Ref Range   Troponin I (  High Sensitivity) 6 <18 ng/L    Comment: (NOTE) Elevated high sensitivity troponin I (hsTnI) values and significant  changes across serial measurements may suggest ACS but many other  chronic and acute conditions are known to elevate hsTnI results.  Refer to  the "Links" section for chest pain algorithms and additional  guidance. Performed at [facility], [address] [place], [place] [address]    DG Chest 2 View Result Date: 06/14/2023 CLINICAL DATA:  Productive cough for 1 week. EXAM: CHEST - 2 VIEW COMPARISON:  January 19, 2023. FINDINGS: Stable cardiomediastinal silhouette. No acute pulmonary disease is noted. Bony thorax is unremarkable. IMPRESSION: No active cardiopulmonary disease. Electronically Signed   By: Rosalene Colon M.D.   On: 06/14/2023 12:22    Pending Labs Unresulted Labs (From admission, onward)     Start     Ordered   06/15/23 0500  Basic metabolic panel  Tomorrow morning,   R        06/14/23 1508   06/14/23 1517  Respiratory (~20 pathogens) panel by PCR  (Respiratory panel by PCR (~20 pathogens, ~24 hr TAT)  w precautions)  Once,   R        06/14/23 1516   06/14/23 1517  Legionella Pneumophila Serogp 1 Ur Ag  Once,   R        06/14/23 1516   06/14/23 1517  Mycoplasma pneumoniae antibody, IgM  Once,   R        06/14/23 1516   06/14/23 1508  Expectorated Sputum Assessment w Gram Stain, Rflx to Resp Cult  (COPD / Pneumonia / Cellulitis / Lower Extremity Wound (Diabetic Foot Infection))  Once,   R        06/14/23 1508   06/14/23 1155  Urinalysis, Routine w reflex microscopic -Urine, Clean Catch  Once,   URGENT       Question:  Specimen Source  Answer:  Urine, Clean Catch   06/14/23 1154            Vitals/Pain Today's Vitals   06/14/23 1150 06/14/23 1151 06/14/23 1342 06/14/23 1500  BP: (!) 142/74  127/64 (!) 139/124  Pulse: 93  96 (!) 131  Resp: 19  (!) 22 (!) 22  Temp: 98.4 F (36.9 C)     TempSrc: Axillary     SpO2: 100%  100% 97%  Weight:  144 lb (65.3 kg)    Height:  5\' 2"  (1.575 m)    PainSc:  0-No pain      Isolation Precautions Droplet precaution  Medications Medications  acetaminophen  (TYLENOL ) tablet 1,000 mg (has no administration in time range)  traMADol  (ULTRAM ) tablet 50  mg (has no administration in time range)  psyllium (HYDROCIL/METAMUCIL) 1 packet (has no administration in time range)  senna-docusate (Senokot-S) tablet 1 tablet (has no administration in time range)  albuterol  (PROVENTIL ) (2.5 MG/3ML) 0.083% nebulizer solution 2.5 mg (has no administration in time range)  budesonide-glycopyrrolate -formoterol  (BREZTRI ) 160-9-4.8 MCG/ACT inhaler 2 puff (has no administration in time range)  ipratropium (ATROVENT) nebulizer solution 0.5 mg (has no administration in time range)  guaiFENesin  (MUCINEX ) 12 hr tablet 1,200 mg (has no administration in time range)  heparin  injection 5,000 Units (has no administration in time range)  ondansetron  (ZOFRAN ) tablet 4 mg (has no administration in time range)    Or  ondansetron  (ZOFRAN ) injection 4 mg (has no administration in time range)  azithromycin  (ZITHROMAX ) 500 mg in sodium chloride  0.9 % 250 mL IVPB (has  no administration in time range)    Followed by  azithromycin  (ZITHROMAX ) tablet 500 mg (has no administration in time range)  methylPREDNISolone  sodium succinate (SOLU-MEDROL ) 40 mg/mL injection 40 mg (has no administration in time range)    Followed by  predniSONE  (DELTASONE ) tablet 40 mg (has no administration in time range)  furosemide (LASIX) injection 20 mg (has no administration in time range)  methylPREDNISolone  sodium succinate (SOLU-MEDROL ) 125 mg/2 mL injection 125 mg (125 mg Intravenous Given 06/14/23 1249)  ipratropium-albuterol  (DUONEB) 0.5-2.5 (3) MG/3ML nebulizer solution 6 mL (6 mLs Nebulization Given 06/14/23 1247)  ipratropium-albuterol  (DUONEB) 0.5-2.5 (3) MG/3ML nebulizer solution 6 mL (6 mLs Nebulization Given 06/14/23 1403)    Mobility walks with device     Focused Assessments Pulmonary Assessment Handoff:  Lung sounds: Bilateral Breath Sounds: Rhonchi L Breath Sounds: Rhonchi R Breath Sounds: Rhonchi O2 Device: Room Air      R Recommendations: See Admitting Provider  Note  Report given to:   Additional Notes:

## 2023-06-14 NOTE — ED Triage Notes (Signed)
 PER EMS: pt arrives from [facility] with c/o productive cough x 1 week. Initial RA sat was 95%. EMS did administer one duo-neb  and the patient did use her inhaler prior to arrival. Denies pain, no fever. She has hx of dementia and is at baseline mentation: A&O to self. Daughter at bedside and states usually around this time every year this happens and she has pneumonia.   BP- 153/114, HR-80, RR-16

## 2023-06-15 ENCOUNTER — Observation Stay
Admit: 2023-06-15 | Discharge: 2023-06-15 | Disposition: A | Discharge status: Home / Self Care | Attending: Internal Medicine

## 2023-06-15 DIAGNOSIS — E876 Hypokalemia: Secondary | ICD-10-CM | POA: Diagnosis present

## 2023-06-15 DIAGNOSIS — Z8249 Family history of ischemic heart disease and other diseases of the circulatory system: Secondary | ICD-10-CM | POA: Diagnosis not present

## 2023-06-15 DIAGNOSIS — N189 Chronic kidney disease, unspecified: Secondary | ICD-10-CM

## 2023-06-15 DIAGNOSIS — F03C0 Unspecified dementia, severe, without behavioral disturbance, psychotic disturbance, mood disturbance, and anxiety: Secondary | ICD-10-CM | POA: Diagnosis not present

## 2023-06-15 DIAGNOSIS — E785 Hyperlipidemia, unspecified: Secondary | ICD-10-CM | POA: Diagnosis present

## 2023-06-15 DIAGNOSIS — B9620 Unspecified Escherichia coli [E. coli] as the cause of diseases classified elsewhere: Secondary | ICD-10-CM | POA: Diagnosis present

## 2023-06-15 DIAGNOSIS — Z85828 Personal history of other malignant neoplasm of skin: Secondary | ICD-10-CM | POA: Diagnosis not present

## 2023-06-15 DIAGNOSIS — I5033 Acute on chronic diastolic (congestive) heart failure: Secondary | ICD-10-CM

## 2023-06-15 DIAGNOSIS — J9601 Acute respiratory failure with hypoxia: Secondary | ICD-10-CM | POA: Diagnosis present

## 2023-06-15 DIAGNOSIS — N390 Urinary tract infection, site not specified: Secondary | ICD-10-CM

## 2023-06-15 DIAGNOSIS — I130 Hypertensive heart and chronic kidney disease with heart failure and stage 1 through stage 4 chronic kidney disease, or unspecified chronic kidney disease: Secondary | ICD-10-CM | POA: Diagnosis present

## 2023-06-15 DIAGNOSIS — Z1611 Resistance to penicillins: Secondary | ICD-10-CM | POA: Diagnosis present

## 2023-06-15 DIAGNOSIS — Z87891 Personal history of nicotine dependence: Secondary | ICD-10-CM | POA: Diagnosis not present

## 2023-06-15 DIAGNOSIS — Z96653 Presence of artificial knee joint, bilateral: Secondary | ICD-10-CM | POA: Diagnosis present

## 2023-06-15 DIAGNOSIS — C859A Non-Hodgkin lymphoma, unspecified, in remission: Secondary | ICD-10-CM | POA: Diagnosis present

## 2023-06-15 DIAGNOSIS — F0390 Unspecified dementia without behavioral disturbance: Secondary | ICD-10-CM | POA: Diagnosis present

## 2023-06-15 DIAGNOSIS — E871 Hypo-osmolality and hyponatremia: Secondary | ICD-10-CM | POA: Diagnosis present

## 2023-06-15 DIAGNOSIS — Z87440 Personal history of urinary (tract) infections: Secondary | ICD-10-CM | POA: Diagnosis not present

## 2023-06-15 DIAGNOSIS — J441 Chronic obstructive pulmonary disease with (acute) exacerbation: Secondary | ICD-10-CM

## 2023-06-15 DIAGNOSIS — N1831 Chronic kidney disease, stage 3a: Secondary | ICD-10-CM

## 2023-06-15 DIAGNOSIS — E039 Hypothyroidism, unspecified: Secondary | ICD-10-CM | POA: Diagnosis present

## 2023-06-15 DIAGNOSIS — Z9221 Personal history of antineoplastic chemotherapy: Secondary | ICD-10-CM | POA: Diagnosis not present

## 2023-06-15 DIAGNOSIS — Z90710 Acquired absence of both cervix and uterus: Secondary | ICD-10-CM | POA: Diagnosis not present

## 2023-06-15 DIAGNOSIS — Z981 Arthrodesis status: Secondary | ICD-10-CM | POA: Diagnosis not present

## 2023-06-15 DIAGNOSIS — Z66 Do not resuscitate: Secondary | ICD-10-CM | POA: Diagnosis present

## 2023-06-15 DIAGNOSIS — I2510 Atherosclerotic heart disease of native coronary artery without angina pectoris: Secondary | ICD-10-CM | POA: Diagnosis present

## 2023-06-15 LAB — RESPIRATORY PANEL BY PCR

## 2023-06-15 LAB — BASIC METABOLIC PANEL WITH GFR
Anion gap: 11 (ref 5–15)
BUN: 22 mg/dL (ref 8–23)
CO2: 25 mmol/L (ref 22–32)
Calcium: 9.3 mg/dL (ref 8.9–10.3)
Chloride: 97 mmol/L — ABNORMAL LOW (ref 98–111)
Creatinine, Ser: 0.83 mg/dL (ref 0.44–1.00)
GFR, Estimated: >60 mL/min (ref >60)
Glucose, Bld: 171 mg/dL — ABNORMAL HIGH (ref 70–99)
Potassium: 4.8 mmol/L (ref 3.5–5.1)
Sodium: 133 mmol/L — ABNORMAL LOW (ref 135–145)

## 2023-06-15 LAB — URINALYSIS, ROUTINE W REFLEX MICROSCOPIC
Bilirubin Urine: NEGATIVE
Glucose, UA: NEGATIVE mg/dL
Ketones, ur: NEGATIVE mg/dL
Nitrite: POSITIVE — AB
Protein, ur: NEGATIVE mg/dL
Specific Gravity, Urine: 1.006 (ref 1.005–1.030)
WBC, UA: >50 WBC/hpf (ref 0–5)
pH: 6 (ref 5.0–8.0)

## 2023-06-15 LAB — BRAIN NATRIURETIC PEPTIDE: B Natriuretic Peptide: 311.2 pg/mL — ABNORMAL HIGH (ref 0.0–100.0)

## 2023-06-15 MED ORDER — SODIUM CHLORIDE 0.9 % IV SOLN
2.0000 g | INTRAVENOUS | Status: DC
  Administered 2023-06-15 – 2023-06-16 (×2): 2 g via INTRAVENOUS
  Filled 2023-06-15 (×3): qty 20

## 2023-06-15 NOTE — Hospital Course (Addendum)
 Taken from H&P.  Monica  H Singh is a 89 y.o. female with medical history significant of COPD Gold stage I, mitral regurgitation, CKD stage IIIa, dementia, non-Hodgkin's lymphoma in remission, HTN, hypothyroidism, left UPJ obstruction status post stenting recently, sent from nursing home for new onset of cough wheezing shortness of breath.   She also noticed some increased of ankle swelling. At baseline she uses roller walker to ambulate and has had poor exercise tolerance and walking inside the room causing shortness of breath.   On presentation borderline tachycardia otherwise stable vitals and no hypoxia, remained on room air.  Labs with leukocytosis at 11.3, chest x-ray negative for any acute infiltrate.  Patient received Solu-Medrol  and DuoNeb and was admitted due to persistent wheezing.  5/17: Vital stable on room air.  Respiratory viral panel negative.  UA today positive for nitrites and leukocytes-urine culture sent, patient unable to explain any symptoms due to advanced dementia-starting on ceftriaxone  She is from a memory care unit and will not be able to go back on Sunday. PT is recommending SNF.  5/18: Continue to have cough with thick sputum, urine cultures growing E. coli-pending susceptibility.  Mild hyponatremia at 132 and increase of BUN-clinically looks dry so holding further IV diuresis.  5/20: Remained hemodynamically stable, mild hyponatremia now sodium at 130 likely due to recent IV Lasix  use.  Further diuresis was held.  Urine cultures with E. coli, shows resistant to ampicillin, Augmentin  and Bactrim.  Patient received ceftriaxone  while in the hospital and is being discharged on 3 more days of cefadroxil to complete the course.  She was also given few more days of steroid for COPD exacerbation. Patient should be encouraged for p.o. hydration including rehydration fluids instead of free water.  She will need a repeat BMP in 2 to 3 days.  Our physical therapist were  recommending rehab, patient is from a memory care unit and likely can get some physical therapy over there.  Patient will continue the rest of her home medications as she was doing it before and need to have a close follow-up with her providers for further assistance.

## 2023-06-15 NOTE — TOC Progression Note (Signed)
 Transition of Care [facility]) - Progression Note    Patient Details  Name: Monica  VALORY Singh MRN: [id number] Date of Birth: [birth year]  [facility]) CM/SW Contact  [place] L Wildon [place], [place] Phone Number: 06/15/2023, 11:53 AM  Clinical Narrative:     CSW met with patient. Daughters, [name]  and [name], were at bedside. Patient is in memory care with [facility]. Family advised that the facility also provides SNF but family wasn't sure if mom could remain at [facility] or have to be moved.   TOC consult completed.        Expected Discharge Plan and Services       Living arrangements for the past 2 months: Skilled Nursing Facility [facility].)                                       Social Determinants of Health (SDOH) Interventions SDOH Screenings   Food Insecurity: Patient Unable To Answer (06/14/2023)  Housing: Patient Unable To Answer (06/14/2023)  Transportation Needs: Patient Unable To Answer (06/14/2023)  Utilities: Patient Unable To Answer (06/14/2023)  Alcohol Screen: Low Risk  (08/19/2022)  Depression (PHQ2-9): Low Risk  (08/19/2022)  Financial Resource Strain: Low Risk  (08/19/2022)  Physical Activity: Insufficiently Active (08/19/2022)  Social Connections: Unknown (06/14/2023)  Stress: No Stress Concern Present (08/19/2022)  Tobacco Use: Medium Risk (06/14/2023)  Health Literacy: Inadequate Health Literacy (08/19/2022)    Readmission Risk Interventions     No data to display

## 2023-06-15 NOTE — Assessment & Plan Note (Signed)
 Unable to explain any urinary symptoms but UA today with nitrites, leukocytes and bacteria. - Urine cultures ordered -Starting on ceftriaxone 

## 2023-06-15 NOTE — Assessment & Plan Note (Signed)
 Patient is on room air and wheezing has been improved. Respiratory panel negative Continue with prednisone  Continue with bronchodilators Continue with Zithromax  Continue with supportive care

## 2023-06-15 NOTE — Assessment & Plan Note (Signed)
 Patient with worsening exertional dyspnea.  No peripheral edema today.  She was started on IV diuresis -Check BNP -Echocardiogram ordered-pending -Strict intake and output -Daily weight and BMP

## 2023-06-15 NOTE — Progress Notes (Signed)
 Progress Note   Patient: Monica Singh:[id number] DOB: [birth year] DOA: 06/14/2023     0 DOS: the patient was seen and examined on 06/15/2023   Brief hospital course: Taken from H&P.  Monica  H Singh is a 89 y.o. female with medical history significant of COPD Gold stage I, mitral regurgitation, CKD stage IIIa, dementia, non-Hodgkin's lymphoma in remission, HTN, hypothyroidism, left UPJ obstruction status post stenting recently, sent from nursing home for new onset of cough wheezing shortness of breath.   She also noticed some increased of ankle swelling. At baseline she uses roller walker to ambulate and has had poor exercise tolerance and walking inside the room causing shortness of breath.   On presentation borderline tachycardia otherwise stable vitals and no hypoxia, remained on room air.  Labs with leukocytosis at 11.3, chest x-ray negative for any acute infiltrate.  Patient received Solu-Medrol  and DuoNeb and was admitted due to persistent wheezing.  5/17: Vital stable on room air.  Respiratory viral panel negative.  UA today positive for nitrites and leukocytes-urine culture sent, patient unable to explain any symptoms due to advanced dementia-starting on ceftriaxone  She is from a memory care unit and will not be able to go back on Sunday. PT is recommending SNF.   Assessment and Plan: * COPD exacerbation (HCC) Patient is on room air and wheezing has been improved. Respiratory panel negative Continue with prednisone  Continue with bronchodilators Continue with Zithromax  Continue with supportive care  UTI (urinary tract infection) Unable to explain any urinary symptoms but UA today with nitrites, leukocytes and bacteria. - Urine cultures ordered -Starting on ceftriaxone   Acute on chronic diastolic CHF (congestive heart failure) (HCC) Patient with worsening exertional dyspnea.  No peripheral edema today.  She was started on IV diuresis -Check BNP -Echocardiogram  ordered-pending -Strict intake and output -Daily weight and BMP  Chronic kidney disease History of CKD stage IIIa.  Renal function seems stable and at baseline. - Monitor renal function as she is getting IV Lasix  Advanced dementia [facility]) Patient resides in a memory care unit at [facility]. Mentation seems to be at baseline-oriented to self -Fall and delirium precautions   Subjective: Patient was sitting in bed comfortably when seen today.  2 daughters at bedside and according to them she seems much improved.  Still having some upper respiratory congestion, on room air.  Patient denies any pain or other symptoms.  She lives in a memory care unit at [facility] and history of advanced dementia.  Physical Exam: Vitals:   06/15/23 0213 06/15/23 0613 06/15/23 0732 06/15/23 0836  BP: 126/73 137/72  (!) 149/81  Pulse: (!) 109 (!) 108  (!) 106  Resp:      Temp: 98.3 F (36.8 C) 98.9 F (37.2 C)  98.3 F (36.8 C)  TempSrc: Oral Oral  Oral  SpO2: 95% 96% 92%   Weight:      Height:       General.  Frail and hard of hearing elderly lady, in no acute distress. Pulmonary.  Lungs clear bilaterally, normal respiratory effort. CV.  Regular rate and rhythm, no JVD, rub or murmur. Abdomen.  Soft, nontender, nondistended, BS positive. CNS.  Alert and oriented to self.  No focal neurologic deficit. Extremities.  No edema, no cyanosis, pulses intact and symmetrical.  Data Reviewed: Prior data reviewed  Family Communication: Discussed with 2 daughters at bedside  Disposition: Status is: Observation The patient will require care spanning > 2 midnights and should be moved to  inpatient because: Severity of illness  Planned Discharge Destination: Skilled nursing facility  DVT prophylaxis.  Subcu heparin  Time spent: 50 minutes  This record has been created using Conservation officer, historic buildings. Errors have been sought and corrected,but may not always be located. Such creation errors  do not reflect on the standard of care.   Author: Luna Salinas, MD 06/15/2023 12:34 PM  For on call review [email or web address].

## 2023-06-15 NOTE — Care Management Obs Status (Signed)
 MEDICARE OBSERVATION STATUS NOTIFICATION   Patient Details  Name: Monica Singh  KAYLENN CIVIL MRN: [id number] Date of Birth: [birth year]   Medicare Observation Status Notification Given:  Yes    [place] L Matei Magnone, LCSW 06/15/2023, 10:07 AM

## 2023-06-15 NOTE — Assessment & Plan Note (Signed)
 History of CKD stage IIIa.  Renal function seems stable and at baseline. - Monitor renal function as she is getting IV Lasix

## 2023-06-15 NOTE — Plan of Care (Signed)
  Problem: Education: Goal: Knowledge of General Education information will improve Description: Including pain rating scale, medication(s)/side effects and non-pharmacologic comfort measures Outcome: Progressing   Problem: Health Behavior/Discharge Planning: Goal: Ability to manage health-related needs will improve Outcome: Progressing   Problem: Clinical Measurements: Goal: Ability to maintain clinical measurements within normal limits will improve Outcome: Progressing Goal: Will remain free from infection Outcome: Progressing Goal: Diagnostic test results will improve Outcome: Progressing Goal: Respiratory complications will improve Outcome: Progressing Goal: Cardiovascular complication will be avoided Outcome: Progressing   Problem: Nutrition: Goal: Adequate nutrition will be maintained Outcome: Progressing   Problem: Elimination: Goal: Will not experience complications related to bowel motility Outcome: Progressing Goal: Will not experience complications related to urinary retention Outcome: Progressing   Problem: Safety: Goal: Ability to remain free from injury will improve Outcome: Progressing   Problem: Education: Goal: Knowledge of disease or condition will improve Outcome: Progressing Goal: Knowledge of the prescribed therapeutic regimen will improve Outcome: Progressing Goal: Individualized Educational Video(s) Outcome: Progressing   Problem: Activity: Goal: Ability to tolerate increased activity will improve Outcome: Progressing Goal: Will verbalize the importance of balancing activity with adequate rest periods Outcome: Progressing   Problem: Respiratory: Goal: Ability to maintain a clear airway will improve Outcome: Progressing Goal: Levels of oxygenation will improve Outcome: Progressing Goal: Ability to maintain adequate ventilation will improve Outcome: Progressing

## 2023-06-15 NOTE — Assessment & Plan Note (Signed)
 Patient resides in a memory care unit at [facility] common. Mentation seems to be at baseline-oriented to self -Fall and delirium precautions

## 2023-06-15 NOTE — Evaluation (Signed)
 Physical Therapy Evaluation Patient Details Name: Monica Singh  Monica Singh MRN: [id number] DOB: 1928-12-15 Today's Date: 06/15/2023  History of Present Illness  Monica Singh  H Chowning is a 89 y.o. female who presents to the ED for evaluation of Cough PMHx of HTN, hypoTSH, COPD, recurrent UTI, osteoporosis, CKD, recurrent UTI, diffuse large cell lymphoma in remission, MR. At baseline she uses RW for AMB and has had poor exertion/activity tolerance, dyspnea with household AMB.  Clinical Impression  Pt requires heavy effort to perform all mobility tasks and light physical assistance for transfers and AMB. Pt requires quite a bit of cognitive facilitation to attend to these tasks. Pt has a few LOB in session. Pt assisted to [facility] for voiding, but incontinence complicated success in this task, she needs near total assist for pericare and brief donning. All of these areas are somewhat off her baseline, however she is adequately set up for full support at her skilled facility. Will continue to follow.       If plan is discharge home, recommend the following:     Can travel by private vehicle   Yes    Equipment Recommendations None recommended by PT  Recommendations for Other Services       Functional Status Assessment Patient has had a recent decline in their functional status and demonstrates the ability to make significant improvements in function in a reasonable and predictable amount of time.     Precautions / Restrictions Precautions Precautions: Fall Restrictions Weight Bearing Restrictions Per Provider Order: No      Mobility  Bed Mobility Overal bed mobility: Needs Assistance Bed Mobility: Supine to Sit     Supine to sit: Supervision     General bed mobility comments: delayed initiation, increased effort    Transfers Overall transfer level: Needs assistance Equipment used: Rolling walker (2 wheels) Transfers: Sit to/from Stand Sit to Stand: Min assist, From elevated surface            General transfer comment: max effort, limited trunk control secondary to effort level    Ambulation/Gait Ambulation/Gait assistance: Contact guard assist Gait Distance (Feet): 45 Feet Assistive device: Rolling walker (2 wheels) Gait Pattern/deviations: Step-to pattern       General Gait Details: slow, ssegmented AMB in room, distarcted at time, needs cues to continue moving  Stairs            Wheelchair Mobility     Tilt Bed    Modified Rankin (Stroke Patients Only)       Balance Overall balance assessment: Needs assistance                                           Pertinent Vitals/Pain Pain Assessment Pain Assessment: No/denies pain    Home Living Family/patient expects to be discharged to:: Skilled nursing facility                   Additional Comments: [facility]    Prior Function Prior Level of Function : Independent/Modified Independent             Mobility Comments: Pt ambulatory with RW, short distances onyl duye to baseline dyspnea ADLs Comments: has assist at [facility]     Extremity/Trunk Assessment                Communication        Cognition Arousal: Alert  PT - Cognitive impairments: History of cognitive impairments, Difficult to assess Difficult to assess due to: Hard of hearing/deaf                     PT - Cognition Comments: not especiallyinteractive, delayed processing, delayed reaction timing, delayed inititatrion.         Cueing       General Comments      Exercises     Assessment/Plan    PT Assessment Patient needs continued PT services  PT Problem List Decreased strength;Decreased range of motion;Decreased activity tolerance;Decreased balance;Decreased mobility;Decreased cognition;Decreased knowledge of use of DME;Decreased safety awareness       PT Treatment Interventions DME instruction;Gait training;Stair training;Functional mobility  training;Therapeutic activities;Therapeutic exercise;Balance training;Cognitive remediation;Patient/family education    PT Goals (Current goals can be found in the Care Plan section)  Acute Rehab PT Goals Patient Stated Goal: regain strength and mobility PT Goal Formulation: With patient Time For Goal Achievement: 06/29/23 Potential to Achieve Goals: Good    Frequency Min 2X/week     Co-evaluation               AM-PAC PT "6 Clicks" Mobility  Outcome Measure Help needed turning from your back to your side while in a flat bed without using bedrails?: A Little Help needed moving from lying on your back to sitting on the side of a flat bed without using bedrails?: A Little Help needed moving to and from a bed to a chair (including a wheelchair)?: A Lot Help needed standing up from a chair using your arms (e.g., wheelchair or bedside chair)?: A Lot Help needed to walk in hospital room?: A Lot Help needed climbing 3-5 steps with a railing? : A Lot 6 Click Score: 14    End of Session   Activity Tolerance: Patient tolerated treatment well;No increased pain Patient left: in chair;with family/visitor present;with call bell/phone within reach Nurse Communication: Mobility status PT Visit Diagnosis: Difficulty in walking, not elsewhere classified (R26.2);Other abnormalities of gait and mobility (R26.89);Muscle weakness (generalized) (M62.81);Other symptoms and signs involving the nervous system (R29.898)    Time: 8295-6213 PT Time Calculation (min) (ACUTE ONLY): 32 min   Charges:   PT Evaluation $PT Eval Low Complexity: 1 Low PT Treatments $Therapeutic Activity: 8-22 mins PT General Charges $$ ACUTE PT VISIT: 1 Visit    12:00 PM, 06/15/23 Dawn Eth, PT, DPT Physical Therapist - [facility]  [phone] (ASCOM)      Travonne Schowalter C 06/15/2023, 11:55 AM

## 2023-06-16 DIAGNOSIS — F03C0 Unspecified dementia, severe, without behavioral disturbance, psychotic disturbance, mood disturbance, and anxiety: Secondary | ICD-10-CM | POA: Diagnosis not present

## 2023-06-16 DIAGNOSIS — J441 Chronic obstructive pulmonary disease with (acute) exacerbation: Secondary | ICD-10-CM | POA: Diagnosis not present

## 2023-06-16 DIAGNOSIS — N390 Urinary tract infection, site not specified: Secondary | ICD-10-CM | POA: Diagnosis not present

## 2023-06-16 DIAGNOSIS — N1831 Chronic kidney disease, stage 3a: Secondary | ICD-10-CM | POA: Diagnosis not present

## 2023-06-16 LAB — BASIC METABOLIC PANEL WITH GFR
Anion gap: 10 (ref 5–15)
BUN: 28 mg/dL — ABNORMAL HIGH (ref 8–23)
CO2: 28 mmol/L (ref 22–32)
Calcium: 8.9 mg/dL (ref 8.9–10.3)
Chloride: 94 mmol/L — ABNORMAL LOW (ref 98–111)
Creatinine, Ser: 0.95 mg/dL (ref 0.44–1.00)
GFR, Estimated: 56 mL/min — ABNORMAL LOW (ref >60)
Glucose, Bld: 115 mg/dL — ABNORMAL HIGH (ref 70–99)
Potassium: 3.5 mmol/L (ref 3.5–5.1)
Sodium: 132 mmol/L — ABNORMAL LOW (ref 135–145)

## 2023-06-16 LAB — CBC
HCT: 36.6 % (ref 36.0–46.0)
Hemoglobin: 11.8 g/dL — ABNORMAL LOW (ref 12.0–15.0)
MCH: 28.4 pg (ref 26.0–34.0)
MCHC: 32.2 g/dL (ref 30.0–36.0)
MCV: 88 fL (ref 80.0–100.0)
Platelets: 369 10*3/uL (ref 150–400)
RBC: 4.16 MIL/uL (ref 3.87–5.11)
RDW: 15.2 % (ref 11.5–15.5)
WBC: 11.3 10*3/uL — ABNORMAL HIGH (ref 4.0–10.5)
nRBC: 0 % (ref 0.0–0.2)

## 2023-06-16 LAB — MYCOPLASMA PNEUMONIAE ANTIBODY, IGM: Mycoplasma pneumo IgM: <770 U/mL (ref 0–769)

## 2023-06-16 MED ORDER — BENZONATATE 100 MG PO CAPS: 100.0000 mg | ORAL_CAPSULE | Freq: Two times a day (BID) | ORAL | Status: DC | PRN

## 2023-06-16 NOTE — Assessment & Plan Note (Signed)
 History of CKD stage IIIa.  Renal function seems stable and at baseline. - Monitor renal function  - Avoid nephrotoxins

## 2023-06-16 NOTE — Assessment & Plan Note (Signed)
 Patient with worsening exertional dyspnea.  No peripheral edema today.  She was started on IV diuresis.  BNP of 311 Echocardiogram done-pending results Clinically appears dry so holding further IV Lasix  -Strict intake and output -Daily weight and BMP

## 2023-06-16 NOTE — Progress Notes (Signed)
 Progress Note   Patient: Monica  GREER Singh QIO:[id number] DOB: September 22, 1928 DOA: 06/14/2023     1 DOS: the patient was seen and examined on 06/16/2023   Brief hospital course: Taken from H&P.  Monica  H Singh is a 89 y.o. female with medical history significant of COPD Gold stage I, mitral regurgitation, CKD stage IIIa, dementia, non-Hodgkin's lymphoma in remission, HTN, hypothyroidism, left UPJ obstruction status post stenting recently, sent from nursing home for new onset of cough wheezing shortness of breath.   She also noticed some increased of ankle swelling. At baseline she uses roller walker to ambulate and has had poor exercise tolerance and walking inside the room causing shortness of breath.   On presentation borderline tachycardia otherwise stable vitals and no hypoxia, remained on room air.  Labs with leukocytosis at 11.3, chest x-ray negative for any acute infiltrate.  Patient received Solu-Medrol  and DuoNeb and was admitted due to persistent wheezing.  5/17: Vital stable on room air.  Respiratory viral panel negative.  UA today positive for nitrites and leukocytes-urine culture sent, patient unable to explain any symptoms due to advanced dementia-starting on ceftriaxone  She is from a memory care unit and will not be able to go back on Sunday. PT is recommending SNF.  5/18: Continue to have cough with thick sputum, urine cultures growing E. coli-pending susceptibility.  Mild hyponatremia at 132 and increase of BUN-clinically looks dry so holding further IV diuresis.   Assessment and Plan: * COPD exacerbation (HCC) Patient is on room air and wheezing has been improved. Respiratory panel negative Continue with prednisone  Continue with bronchodilators Continue with Zithromax  Continue with supportive care  UTI (urinary tract infection) Unable to explain any urinary symptoms but UA with nitrites, leukocytes and bacteria.  Urine cultures with E. coli-pending  susceptibility - Continue with ceftriaxone -we will de-escalate once susceptibilities available  Acute on chronic diastolic CHF (congestive heart failure) (HCC) Patient with worsening exertional dyspnea.  No peripheral edema today.  She was started on IV diuresis.  BNP of 311 Echocardiogram done-pending results Clinically appears dry so holding further IV Lasix  -Strict intake and output -Daily weight and BMP  Chronic kidney disease History of CKD stage IIIa.  Renal function seems stable and at baseline. - Monitor renal function  - Avoid nephrotoxins  Advanced dementia [facility]) Patient resides in a memory care unit at [facility] common. Mentation seems to be at baseline-oriented to self -Fall and delirium precautions   Subjective: Patient continues to have significant cough with thick sputum.  Otherwise no other new concern.  Daughter at bedside  Physical Exam: Vitals:   06/15/23 1954 06/15/23 2001 06/16/23 0524 06/16/23 0832  BP: 129/69 129/69 (!) 147/70 (!) 148/75  Pulse: (!) 103 (!) 103 92 85  Resp:    18  Temp: 98.6 F (37 C)  97.7 F (36.5 C) 98.2 F (36.8 C)  TempSrc: Oral  Oral   SpO2: 93% 95% 99% 96%  Weight:      Height:       General.  Hard of hearing, frail elderly lady, in no acute distress. Pulmonary.  Lungs clear bilaterally, normal respiratory effort. CV.  Regular rate and rhythm, no JVD, rub or murmur. Abdomen.  Soft, nontender, nondistended, BS positive. CNS.  Alert and oriented .  No focal neurologic deficit. Extremities.  No edema, no cyanosis, pulses intact and symmetrical.  Data Reviewed: Prior data reviewed  Family Communication: Discussed with daughter at bedside  Disposition: Status is: Inpatient  inpatient because: Severity of illness  Planned Discharge Destination: Skilled nursing facility  DVT prophylaxis.  Subcu heparin  Time spent: 50 minutes  This record has been created using Conservation officer, historic buildings. Errors have been sought and  corrected,but may not always be located. Such creation errors do not reflect on the standard of care.   Author: Luna Salinas, MD 06/16/2023 2:30 PM  For on call review [email or web address].

## 2023-06-16 NOTE — Plan of Care (Signed)
   Problem: Nutrition: Goal: Adequate nutrition will be maintained Outcome: Progressing   Problem: Coping: Goal: Level of anxiety will decrease Outcome: Progressing   Problem: Pain Managment: Goal: General experience of comfort will improve and/or be controlled Outcome: Progressing

## 2023-06-16 NOTE — Assessment & Plan Note (Signed)
 Unable to explain any urinary symptoms but UA with nitrites, leukocytes and bacteria.  Urine cultures with E. coli-pending susceptibility - Continue with ceftriaxone -we will de-escalate once susceptibilities available

## 2023-06-16 NOTE — Plan of Care (Signed)
  Problem: Education: Goal: Knowledge of General Education information will improve Description: Including pain rating scale, medication(s)/side effects and non-pharmacologic comfort measures Outcome: Not Progressing   Problem: Health Behavior/Discharge Planning: Goal: Ability to manage health-related needs will improve Outcome: Not Progressing   Problem: Clinical Measurements: Goal: Ability to maintain clinical measurements within normal limits will improve Outcome: Progressing Goal: Will remain free from infection Outcome: Progressing Goal: Diagnostic test results will improve Outcome: Progressing Goal: Respiratory complications will improve Outcome: Progressing Goal: Cardiovascular complication will be avoided Outcome: Progressing   Problem: Activity: Goal: Risk for activity intolerance will decrease Outcome: Progressing   Problem: Nutrition: Goal: Adequate nutrition will be maintained Outcome: Progressing   Problem: Coping: Goal: Level of anxiety will decrease Outcome: Progressing   Problem: Elimination: Goal: Will not experience complications related to bowel motility Outcome: Progressing Goal: Will not experience complications related to urinary retention Outcome: Progressing   Problem: Pain Managment: Goal: General experience of comfort will improve and/or be controlled Outcome: Progressing   Problem: Safety: Goal: Ability to remain free from injury will improve Outcome: Progressing   Problem: Skin Integrity: Goal: Risk for impaired skin integrity will decrease Outcome: Progressing   Problem: Education: Goal: Knowledge of disease or condition will improve Outcome: Progressing Goal: Knowledge of the prescribed therapeutic regimen will improve Outcome: Progressing Goal: Individualized Educational Video(s) Outcome: Progressing   Problem: Activity: Goal: Ability to tolerate increased activity will improve Outcome: Progressing Goal: Will verbalize the  importance of balancing activity with adequate rest periods Outcome: Progressing   Problem: Respiratory: Goal: Ability to maintain a clear airway will improve Outcome: Progressing Goal: Levels of oxygenation will improve Outcome: Progressing Goal: Ability to maintain adequate ventilation will improve Outcome: Progressing

## 2023-06-16 NOTE — Assessment & Plan Note (Signed)
 Patient is on room air and wheezing has been improved. Respiratory panel negative Continue with prednisone  Continue with bronchodilators Continue with Zithromax  Continue with supportive care

## 2023-06-17 DIAGNOSIS — N390 Urinary tract infection, site not specified: Secondary | ICD-10-CM | POA: Diagnosis not present

## 2023-06-17 DIAGNOSIS — N1831 Chronic kidney disease, stage 3a: Secondary | ICD-10-CM | POA: Diagnosis not present

## 2023-06-17 DIAGNOSIS — J441 Chronic obstructive pulmonary disease with (acute) exacerbation: Secondary | ICD-10-CM | POA: Diagnosis not present

## 2023-06-17 DIAGNOSIS — I5033 Acute on chronic diastolic (congestive) heart failure: Secondary | ICD-10-CM | POA: Diagnosis not present

## 2023-06-17 LAB — ECHOCARDIOGRAM COMPLETE
AR max vel: 2.4 cm2
AV Area VTI: 2.38 cm2
AV Area mean vel: 2.28 cm2
AV Mean grad: 2 mmHg
AV Peak grad: 3.5 mmHg
Ao pk vel: 0.94 m/s
Area-P 1/2: 7.16 cm2
Calc EF: 62.4 %
Height: 62 in
MV VTI: 1.36 cm2
S' Lateral: 2.2 cm
Single Plane A2C EF: 61.9 %
Single Plane A4C EF: 57.7 %
Weight: 2304 [oz_av]

## 2023-06-17 LAB — BASIC METABOLIC PANEL WITH GFR
Anion gap: 9 (ref 5–15)
BUN: 28 mg/dL — ABNORMAL HIGH (ref 8–23)
CO2: 26 mmol/L (ref 22–32)
Calcium: 8.6 mg/dL — ABNORMAL LOW (ref 8.9–10.3)
Chloride: 95 mmol/L — ABNORMAL LOW (ref 98–111)
Creatinine, Ser: 0.74 mg/dL (ref 0.44–1.00)
GFR, Estimated: >60 mL/min (ref >60)
Glucose, Bld: 105 mg/dL — ABNORMAL HIGH (ref 70–99)
Potassium: 3.4 mmol/L — ABNORMAL LOW (ref 3.5–5.1)
Sodium: 130 mmol/L — ABNORMAL LOW (ref 135–145)

## 2023-06-17 LAB — URINE CULTURE: Culture: >=100000 — AB

## 2023-06-17 LAB — LEGIONELLA PNEUMOPHILA SEROGP 1 UR AG: L. pneumophila Serogp 1 Ur Ag: NEGATIVE

## 2023-06-17 MED ORDER — GUAIFENESIN ER 600 MG PO TB12: 1200.0000 mg | ORAL_TABLET | Freq: Two times a day (BID) | ORAL | Status: AC | PRN

## 2023-06-17 MED ORDER — BENZONATATE 100 MG PO CAPS: 100.0000 mg | ORAL_CAPSULE | Freq: Two times a day (BID) | ORAL | Status: DC | PRN

## 2023-06-17 MED ORDER — POTASSIUM CHLORIDE CRYS ER 20 MEQ PO TBCR
20.0000 meq | EXTENDED_RELEASE_TABLET | Freq: Once | ORAL | Status: AC
  Administered 2023-06-17: 20 meq via ORAL
  Filled 2023-06-17: qty 1

## 2023-06-17 MED ORDER — CEFADROXIL 500 MG PO CAPS: 500.0000 mg | ORAL_CAPSULE | Freq: Two times a day (BID) | ORAL | Status: DC

## 2023-06-17 MED ORDER — PREDNISONE 20 MG PO TABS: 40.0000 mg | ORAL_TABLET | Freq: Every day | ORAL | Status: DC

## 2023-06-17 NOTE — Progress Notes (Signed)
 Mobility Specialist - Progress Note   06/17/23 1107  Mobility  Activity Ambulated with assistance to bathroom;Ambulated with assistance in room  Level of Assistance Standby assist, set-up cues, supervision of patient - no hands on  Assistive Device Front wheel walker  Distance Ambulated (ft) 24 ft  Activity Response Tolerated well  Mobility visit 1 Mobility  Mobility Specialist Start Time (ACUTE ONLY) [id number]  Mobility Specialist Stop Time (ACUTE ONLY) 1019  Mobility Specialist Time Calculation (min) (ACUTE ONLY) 20 min   Pt supine upon entry, utilizing RA. Pt requesting to use the bathroom. Pt completed bed mob MinA to bring BLE EOB. Pt STS to RW MinG, amb to the bathroom with SBA to void. Pt STS to RW, requiring MaxA for peri care and to doff/don undergarments and pants. Pt returned to the room, left supine with alarm set and needs within reach.  Versa Gore Mobility Specialist 06/17/23 11:13 AM

## 2023-06-17 NOTE — Plan of Care (Signed)
  Problem: Education: Goal: Knowledge of General Education information will improve Description: Including pain rating scale, medication(s)/side effects and non-pharmacologic comfort measures Outcome: Not Progressing   Problem: Health Behavior/Discharge Planning: Goal: Ability to manage health-related needs will improve Outcome: Not Progressing   Problem: Clinical Measurements: Goal: Ability to maintain clinical measurements within normal limits will improve Outcome: Progressing Goal: Will remain free from infection Outcome: Progressing Goal: Diagnostic test results will improve Outcome: Progressing Goal: Respiratory complications will improve Outcome: Progressing Goal: Cardiovascular complication will be avoided Outcome: Progressing   Problem: Activity: Goal: Risk for activity intolerance will decrease Outcome: Progressing   Problem: Nutrition: Goal: Adequate nutrition will be maintained Outcome: Progressing   Problem: Coping: Goal: Level of anxiety will decrease Outcome: Progressing   Problem: Elimination: Goal: Will not experience complications related to bowel motility Outcome: Progressing Goal: Will not experience complications related to urinary retention Outcome: Progressing   Problem: Pain Managment: Goal: General experience of comfort will improve and/or be controlled Outcome: Progressing   Problem: Safety: Goal: Ability to remain free from injury will improve Outcome: Progressing   Problem: Skin Integrity: Goal: Risk for impaired skin integrity will decrease Outcome: Progressing   Problem: Education: Goal: Knowledge of disease or condition will improve Outcome: Not Progressing Goal: Knowledge of the prescribed therapeutic regimen will improve Outcome: Not Progressing Goal: Individualized Educational Video(s) Outcome: Not Progressing   Problem: Activity: Goal: Ability to tolerate increased activity will improve Outcome: Progressing Goal: Will  verbalize the importance of balancing activity with adequate rest periods Outcome: Progressing   Problem: Respiratory: Goal: Ability to maintain a clear airway will improve Outcome: Progressing Goal: Levels of oxygenation will improve Outcome: Progressing Goal: Ability to maintain adequate ventilation will improve Outcome: Progressing

## 2023-06-17 NOTE — Discharge Summary (Signed)
 Physician Discharge Summary   Patient: Monica Singh MRN: [id number] DOB: 1928-10-17  Admit date:     06/14/2023  Discharge date: 06/17/23  Discharge Physician: Luna Salinas   PCP: Lamon Pillow, MD   Recommendations at discharge:  Please obtain CBC and BMP and follow-up Please check sodium levels in 2 to 3 days Please encourage p.o. hydration including rehydration fluid instead of free water. Please ensure completion of antibiotics and prednisone  Follow-up with primary care provider within a week  Discharge Diagnoses: Principal Problem:   COPD exacerbation (HCC) Active Problems:   COPD with acute exacerbation (HCC)   UTI (urinary tract infection)   Acute on chronic diastolic CHF (congestive heart failure) (HCC)   Chronic kidney disease   Advanced dementia [facility])  Hospital Course: Monica  H Singh is a 89 y.o. female with medical history significant of COPD Gold stage I, mitral regurgitation, CKD stage IIIa, dementia, non-Hodgkin's lymphoma in remission, HTN, hypothyroidism, left UPJ obstruction status post stenting recently, sent from nursing home for new onset of cough wheezing shortness of breath.   She also noticed some increased of ankle swelling. At baseline she uses roller walker to ambulate and has had poor exercise tolerance and walking inside the room causing shortness of breath.   On presentation borderline tachycardia otherwise stable vitals and no hypoxia, remained on room air.  Labs with leukocytosis at 11.3, chest x-ray negative for any acute infiltrate.  Patient received Solu-Medrol  and DuoNeb and was admitted due to persistent wheezing.  5/17: Vital stable on room air.  Respiratory viral panel negative.  UA today positive for nitrites and leukocytes-urine culture sent, patient unable to explain any symptoms due to advanced dementia-starting on ceftriaxone  She is from a memory care unit and will not be able to go back on Sunday. PT is recommending  SNF.  5/18: Continue to have cough with thick sputum, urine cultures growing E. coli-pending susceptibility.  Mild hyponatremia at 132 and increase of BUN-clinically looks dry so holding further IV diuresis.  5/20: Remained hemodynamically stable, mild hyponatremia now sodium at 130 likely due to recent IV Lasix  use.  Further diuresis was held.  Urine cultures with E. coli, shows resistant to ampicillin, Augmentin  and Bactrim.  Patient received ceftriaxone  while in the hospital and is being discharged on 3 more days of cefadroxil to complete the course.  She was also given few more days of steroid for COPD exacerbation. Patient should be encouraged for p.o. hydration including rehydration fluids instead of free water.  She will need a repeat BMP in 2 to 3 days.  Our physical therapist were recommending rehab, patient is from a memory care unit and likely can get some physical therapy over there.  Patient will continue the rest of her home medications as she was doing it before and need to have a close follow-up with her providers for further assistance.  Assessment and Plan: * COPD exacerbation (HCC) Patient is on room air and wheezing has been improved. Respiratory panel negative Continue with prednisone  to complete a 5-day course Continue with bronchodilators Continue with Zithromax  to complete a 5-day course Continue with supportive care  UTI (urinary tract infection) Unable to explain any urinary symptoms but UA with nitrites, leukocytes and bacteria.  Urine cultures with E. Coli-resistant to ampicillin, Augmentin  and Bactrim. Received ceftriaxone  in the hospital and is being discharged on cefadroxil to complete a 5-day course   Acute on chronic diastolic CHF (congestive heart failure) (HCC) Patient with worsening exertional dyspnea.  No peripheral edema today.  She was started on IV diuresis.  BNP of [address] Echocardiogram done-pending results Clinically appears dry so holding further  diuresis -Strict intake and output -Daily weight and BMP  Chronic kidney disease History of CKD stage IIIa.  Renal function seems stable and at baseline. - Monitor renal function  - Avoid nephrotoxins  Advanced dementia [facility]) Patient resides in a memory care unit at [facility] common. Mentation seems to be at baseline-oriented to self -Fall and delirium precautions  Mild hyponatremia.  Likely due to recent use of diuresis.  Sodium at 130 on the day of discharge with mild hypokalemia 3.4.  Potassium has been repleted.  Patient need to encourage p.o. hydration using rehydration fluids and sprinkle some extra salt.  She will need monitoring of her sodium.  Consultants: None Procedures performed: None Disposition: Skilled nursing facility Diet recommendation:  Discharge Diet Orders (From admission, onward)     Start     Ordered   06/17/23 0000  Diet - low sodium heart healthy        06/17/23 1019           Regular diet DISCHARGE MEDICATION: Allergies as of 06/17/2023       Reactions   Ciprofloxacin  Itching   Whelps on face reported 03/16/2021   Latex Itching   Misc. Sulfonamide Containing Compounds    Sulfa Antibiotics Hives, Itching        Medication List     STOP taking these medications    cephALEXin  500 MG capsule Commonly known as: KEFLEX    folic acid  400 MCG tablet Commonly known as: FOLVITE    Spiriva  HandiHaler 18 MCG inhalation capsule Generic drug: tiotropium   traMADol  50 MG tablet Commonly known as: ULTRAM        TAKE these medications    acetaminophen  500 MG tablet Commonly known as: TYLENOL  Take 1,000 mg by mouth 2 (two) times daily.   AeroChamber MV inhaler Use as instructed   albuterol  108 (90 Base) MCG/ACT inhaler Commonly known as: VENTOLIN  HFA Inhale 2 puffs into the lungs every 6 (six) hours as needed for wheezing or shortness of breath.   benzonatate  100 MG capsule Commonly known as: TESSALON  Take 1 capsule (100 mg total) by  mouth 2 (two) times daily as needed for cough.   Breztri  Aerosphere 160-9-4.8 MCG/ACT Aero inhaler Generic drug: budesonide -glycopyrrolate -formoterol  Inhale 2 puffs into the lungs in the morning and at bedtime.   cefadroxil 500 MG capsule Commonly known as: DURICEF Take 1 capsule (500 mg total) by mouth 2 (two) times daily for 3 days.   cholecalciferol 25 MCG (1000 UNIT) tablet Commonly known as: VITAMIN D3 Take 1,000 Units by mouth daily.   ferrous sulfate  325 (65 FE) MG tablet Take 325 mg by mouth daily with breakfast.   FLORASTOR PO Take 2 capsules by mouth daily.   guaiFENesin  600 MG 12 hr tablet Commonly known as: MUCINEX  Take 2 tablets (1,200 mg total) by mouth 2 (two) times daily as needed for to loosen phlegm or cough.   ipratropium 0.02 % nebulizer solution Commonly known as: ATROVENT  Take 0.5 mg by nebulization every 8 (eight) hours as needed for shortness of breath (cough).   methenamine  1 g tablet Commonly known as: HIPREX  TAKE ONE (1) TABLET BY MOUTH TWO TIMES PER DAY   predniSONE  20 MG tablet Commonly known as: DELTASONE  Take 2 tablets (40 mg total) by mouth daily with breakfast for 3 days. Start taking on: Jun 18, 2023   psyllium  0.52 g capsule Commonly known as: REGULOID Take 0.52 g by mouth daily.   senna-docusate 8.6-50 MG tablet Commonly known as: Senokot-S Take 1 tablet by mouth daily.        Follow-up Information     Fisher, Erlinda Haws, MD Follow up.   Specialty: Family Medicine Why: hospital follow up Contact information: [address] [place] [address] [place] [address] [phone]                Discharge Exam: Cleavon Curls Weights   06/14/23 1151  Weight: 65.3 kg   General.  Frail elderly lady, in no acute distress. Pulmonary.  Lungs clear bilaterally, normal respiratory effort. CV.  Regular rate and rhythm, no JVD, rub or murmur. Abdomen.  Soft, nontender, nondistended, BS positive. CNS.  Alert and oriented .  No focal  neurologic deficit. Extremities.  No edema, no cyanosis, pulses intact and symmetrical.   Condition at discharge: stable  The results of significant diagnostics from this hospitalization (including imaging, microbiology, ancillary and laboratory) are listed below for reference.   Imaging Studies: DG Chest 2 View Result Date: 06/14/2023 CLINICAL DATA:  Productive cough for 1 week. EXAM: CHEST - 2 VIEW COMPARISON:  January 19, 2023. FINDINGS: Stable cardiomediastinal silhouette. No acute pulmonary disease is noted. Bony thorax is unremarkable. IMPRESSION: No active cardiopulmonary disease. Electronically Signed   By: Rosalene Colon M.D.   On: 06/14/2023 12:22   DG OR UROLOGY CYSTO IMAGE ([facility] ONLY) Result Date: 05/30/2023 There is no interpretation for this exam.  This order is for images obtained during a surgical procedure.  Please See "Surgeries" Tab for more information regarding the procedure.    Microbiology: Results for orders placed or performed during the hospital encounter of 06/14/23  Respiratory (~20 pathogens) panel by PCR     Status: None   Collection Time: 06/14/23  5:38 PM   Specimen: Nasopharyngeal Swab; Respiratory  Result Value Ref Range Status   Adenovirus NOT DETECTED NOT DETECTED Final   Coronavirus 229E NOT DETECTED NOT DETECTED Final    Comment: (NOTE) The Coronavirus on the Respiratory Panel, DOES NOT test for the novel  Coronavirus (2019 nCoV)    Coronavirus HKU1 NOT DETECTED NOT DETECTED Final   Coronavirus NL63 NOT DETECTED NOT DETECTED Final   Coronavirus OC43 NOT DETECTED NOT DETECTED Final   Metapneumovirus NOT DETECTED NOT DETECTED Final   Rhinovirus / Enterovirus NOT DETECTED NOT DETECTED Final   Influenza A NOT DETECTED NOT DETECTED Final   Influenza B NOT DETECTED NOT DETECTED Final   Parainfluenza Virus 1 NOT DETECTED NOT DETECTED Final   Parainfluenza Virus 2 NOT DETECTED NOT DETECTED Final   Parainfluenza Virus 3 NOT DETECTED NOT DETECTED Final    Parainfluenza Virus 4 NOT DETECTED NOT DETECTED Final   Respiratory Syncytial Virus NOT DETECTED NOT DETECTED Final   Bordetella pertussis NOT DETECTED NOT DETECTED Final   Bordetella Parapertussis NOT DETECTED NOT DETECTED Final   Chlamydophila pneumoniae NOT DETECTED NOT DETECTED Final   Mycoplasma pneumoniae NOT DETECTED NOT DETECTED Final    Comment: Performed at [facility], [address], [place], [place] [address]  Urine Culture (for pregnant, neutropenic or urologic patients or patients with an indwelling urinary catheter)     Status: Abnormal   Collection Time: 06/15/23  9:34 AM   Specimen: Urine, Clean Catch  Result Value Ref Range Status   Specimen Description   Final    URINE, CLEAN CATCH Performed at [facility], [address] [place], [place] [address]    Special Requests   Final    NONE Performed at [facility], [address] [place], [place] [address]    Culture >=100,000 COLONIES/mL ESCHERICHIA COLI (A)  Final   Report Status 06/17/2023 FINAL  Final   Organism ID, Bacteria ESCHERICHIA COLI (A)  Final      Susceptibility   Escherichia coli - MIC*    AMPICILLIN >=32 RESISTANT Resistant     CEFAZOLIN  <=4 SENSITIVE Sensitive     CEFEPIME  <=0.12 SENSITIVE Sensitive     CEFTRIAXONE  <=0.25 SENSITIVE Sensitive     CIPROFLOXACIN  <=0.25 SENSITIVE Sensitive     GENTAMICIN <=1 SENSITIVE Sensitive     IMIPENEM <=0.25 SENSITIVE Sensitive     NITROFURANTOIN  <=16 SENSITIVE Sensitive     TRIMETH/SULFA >=320 RESISTANT Resistant     AMPICILLIN/SULBACTAM 16 INTERMEDIATE Intermediate     PIP/TAZO <=4 SENSITIVE Sensitive ug/mL    * >=100,000 COLONIES/mL ESCHERICHIA COLI    Labs: CBC: Recent Labs  Lab 06/14/23 1243 06/16/23 0351  WBC 11.3* 11.3*  NEUTROABS 6.1  --   HGB 11.9* 11.8*  HCT 37.6 36.6  MCV 90.4 88.0  PLT 335 369   Basic Metabolic Panel: Recent Labs  Lab 06/14/23 1243 06/15/23 0408 06/16/23 0351 06/17/23 0534  NA 131*  133* 132* 130*  K 4.0 4.8 3.5 3.4*  CL 97* 97* 94* 95*  CO2 24 25 28 26   GLUCOSE 99 171* 115* 105*  BUN 22 22 28* 28*  CREATININE 0.92 0.83 0.95 0.74  CALCIUM  8.8* 9.3 8.9 8.6*  MG 1.9  --   --   --    Liver Function Tests: Recent Labs  Lab 06/14/23 1243  AST 18  ALT 14  ALKPHOS 73  BILITOT 0.6  PROT 7.0  ALBUMIN 3.8   CBG: No results for input(s): "GLUCAP" in the last 168 hours.  Discharge time spent: greater than 30 minutes.  This record has been created using Conservation officer, historic buildings. Errors have been sought and corrected,but may not always be located. Such creation errors do not reflect on the standard of care.   Signed: Luna Salinas, MD [facility] 06/17/2023

## 2023-06-17 NOTE — NC FL2 (Signed)
 [place]  MEDICAID FL2 LEVEL OF CARE FORM     IDENTIFICATION  Patient Name: Monica Singh  MARDY HOPPE Birthdate: Jun 22, 1928 Sex: female Admission Date (Current Location): 06/14/2023  [facility] and [name] Number:  Chiropodist and Address:  [facility], [address], [place], [place] [address]      Provider Number: [id number]  Attending Physician Name and Address:  Luna Salinas, MD  Relative Name and Phone Number:  [name]  Archer Kobs (daughter) [phone]    Current Level of Care: Hospital Recommended Level of Care: Skilled Nursing Facility Prior Approval Number:    Date Approved/Denied:   PASRR Number: [id number]  Discharge Plan: SNF    Current Diagnoses: Patient Active Problem List   Diagnosis Date Noted   Chronic kidney disease    Acute on chronic diastolic CHF (congestive heart failure) (HCC) 06/14/2023   COPD exacerbation (HCC) 06/14/2023   UTI (urinary tract infection) 11/16/2022   Advanced dementia (HCC) 11/16/2022   Acute metabolic encephalopathy 11/16/2022   Generalized weakness 04/11/2022   Ground-level fall 04/10/2022   COPD with acute exacerbation (HCC) 04/10/2022   Essential hypertension 04/10/2022   Acute encephalopathy 04/10/2022   Hiatal hernia 01/05/2022   Dysuria 09/28/2021   Left wrist pain 07/23/2021   Tendinitis of left wrist 07/23/2021   Closed fracture of distal end of left radius 07/23/2021   Tenosynovitis of left wrist 07/23/2021   Bilateral carotid artery stenosis 05/27/2020   Staring episodes 12/19/2019   B12 deficiency 12/19/2019   Vitamin D  deficiency 12/19/2019   Frequent UTI 12/19/2019   Syncope 12/16/2019   Total knee replacement status 12/23/2018   Obstruction of left ureteropelvic junction (UPJ) 02/26/2018   Sepsis (HCC) 02/10/2018   Closed burst fracture of lumbar vertebra with routine healing 04/01/2017   Chronic bronchitis (HCC) 02/18/2017   Age-related osteoporosis with current pathological  fracture with routine healing 02/18/2017   Osteoporosis 11/20/2015   COPD (chronic obstructive pulmonary disease) (HCC) 11/20/2015   Cyst of ovary 07/26/2015   Status post total left knee replacement 04/03/2015   Abnormal chest sounds 08/25/2014   Allergic rhinitis 08/25/2014   Colon polyp 08/25/2014   DDD (degenerative disc disease), cervical 08/25/2014   DDD (degenerative disc disease), lumbar 08/25/2014   H/O non-Hodgkin's lymphoma 08/25/2014   Hyponatremia 08/25/2014   Primary osteoarthritis of right knee 08/25/2014   Subclinical hypothyroidism 08/25/2014   Adnexal mass 06/03/2014   Osteoarthritis 03/19/2011   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   History of smoking 03/11/2008    Orientation RESPIRATION BLADDER Height & Weight     Self    Incontinent Weight: 65.3 kg Height:  5\' 2"  (157.5 cm)  BEHAVIORAL SYMPTOMS/MOOD NEUROLOGICAL BOWEL NUTRITION STATUS      Incontinent Diet (Regular)  AMBULATORY STATUS COMMUNICATION OF NEEDS Skin   Limited Assist                           Personal Care Assistance Level of Assistance  Bathing, Feeding, Dressing Bathing Assistance: Limited assistance Feeding assistance: Limited assistance Dressing Assistance: Limited assistance     Functional Limitations Info  Sight, Hearing Sight Info: Impaired Hearing Info: Impaired      SPECIAL CARE FACTORS FREQUENCY                       Contractures      Additional Factors Info  Code Status, Allergies Code Status Info: FULL Allergies Info: cipro , latex, sulfa antibiotics, sulfonamide  containing compounds           Current Medications (06/17/2023):  This is the current hospital active medication list Current Facility-Administered Medications  Medication Dose Route Frequency Provider Last Rate Last Admin   acetaminophen  (TYLENOL ) tablet 1,000 mg  1,000 mg Oral BID Antoniette Batty T, MD   1,000 mg at 06/17/23 1610   benzonatate  (TESSALON ) capsule 100 mg  100 mg Oral BID  PRN Amin, Sumayya, MD       budesonide -glycopyrrolate -formoterol  (BREZTRI ) 160-9-4.8 MCG/ACT inhaler 2 puff  2 puff Inhalation BID Antoniette Batty T, MD   2 puff at 06/17/23 0848   cefTRIAXone  (ROCEPHIN ) 2 g in sodium chloride  0.9 % 100 mL IVPB  2 g Intravenous Q24H Amin, Sumayya, MD 200 mL/hr at 06/16/23 1328 2 g at 06/16/23 1328   guaiFENesin  (MUCINEX ) 12 hr tablet 1,200 mg  1,200 mg Oral BID Antoniette Batty T, MD   1,200 mg at 06/17/23 0847   heparin  injection 5,000 Units  5,000 Units Subcutaneous Q12H Antoniette Batty T, MD   5,000 Units at 06/17/23 0848   ipratropium (ATROVENT ) nebulizer solution 0.5 mg  0.5 mg Inhalation Q4H PRN Antoniette Batty T, MD       ondansetron  (ZOFRAN ) tablet 4 mg  4 mg Oral Q6H PRN Antoniette Batty T, MD       Or   ondansetron  (ZOFRAN ) injection 4 mg  4 mg Intravenous Q6H PRN Antoniette Batty T, MD       predniSONE  (DELTASONE ) tablet 40 mg  40 mg Oral Q breakfast Antoniette Batty T, MD   40 mg at 06/17/23 0847   psyllium (HYDROCIL/METAMUCIL) 1 packet  1 packet Oral Daily [place], MD   1 packet at 06/17/23 0846   senna-docusate (Senokot-S) tablet 1 tablet  1 tablet Oral Daily Antoniette Batty T, MD   1 tablet at 06/17/23 0847   traMADol  (ULTRAM ) tablet 50 mg  50 mg Oral Q6H PRN [place], MD   50 mg at 06/16/23 2135     Discharge Medications: Please see discharge summary for a list of discharge medications.  Relevant Imaging Results:  Relevant Lab Results:   Additional Information [id number]  Elsie Halo, RN

## 2023-06-17 NOTE — TOC Transition Note (Signed)
 Transition of Care [facility]) - Discharge Note   Patient Details  Name: Monica Singh MRN: [id number] Date of Birth: Aug 21, 1928  [facility]) CM/SW Contact:  Elsie Halo, RN Phone Number: 06/17/2023, 9:37 AM   Clinical Narrative:    Patient is medically clear to discharge to [facility]. TOC spoke with the patient's daughter, [name]  [phone] , and she is agreeable with the dc plan. [facility] will transport.  Nurse to call report to [phone].   Final next level of care: Skilled Nursing Facility Barriers to Discharge: Continued Medical Work up   Patient Goals and CMS Choice            Discharge Placement              Patient chooses bed at: [facility] Patient to be transferred to facility by: Ephraim Hash Name of family member notified: [name]  (daughter) Patient and family notified of of transfer: 06/17/23  Discharge Plan and Services Additional resources added to the After Visit Summary for                                       Social Drivers of Health (SDOH) Interventions SDOH Screenings   Food Insecurity: Patient Unable To Answer (06/14/2023)  Housing: Patient Unable To Answer (06/14/2023)  Transportation Needs: Patient Unable To Answer (06/14/2023)  Utilities: Patient Unable To Answer (06/14/2023)  Alcohol Screen: Low Risk  (08/19/2022)  Depression (PHQ2-9): Low Risk  (08/19/2022)  Financial Resource Strain: Low Risk  (08/19/2022)  Physical Activity: Insufficiently Active (08/19/2022)  Social Connections: Unknown (06/14/2023)  Stress: No Stress Concern Present (08/19/2022)  Tobacco Use: Medium Risk (06/14/2023)  Health Literacy: Inadequate Health Literacy (08/19/2022)     Readmission Risk Interventions     No data to display

## 2023-06-18 ENCOUNTER — Encounter: Payer: Self-pay | Admitting: Emergency Medicine

## 2023-06-18 ENCOUNTER — Emergency Department

## 2023-06-18 ENCOUNTER — Other Ambulatory Visit: Payer: Self-pay

## 2023-06-18 ENCOUNTER — Inpatient Hospital Stay
Admission: EM | Admit: 2023-06-18 | Discharge: 2023-06-26 | DRG: 515 | Disposition: A | Discharge status: Skilled Nursing Facility | Source: Skilled Nursing Facility | Attending: Internal Medicine | Admitting: Internal Medicine

## 2023-06-18 DIAGNOSIS — S22080S Wedge compression fracture of T11-T12 vertebra, sequela: Secondary | ICD-10-CM

## 2023-06-18 DIAGNOSIS — S22089A Unspecified fracture of T11-T12 vertebra, initial encounter for closed fracture: Principal | ICD-10-CM | POA: Diagnosis present

## 2023-06-18 DIAGNOSIS — N200 Calculus of kidney: Secondary | ICD-10-CM

## 2023-06-18 DIAGNOSIS — W19XXXA Unspecified fall, initial encounter: Secondary | ICD-10-CM

## 2023-06-18 DIAGNOSIS — Z6826 Body mass index (BMI) 26.0-26.9, adult: Secondary | ICD-10-CM

## 2023-06-18 DIAGNOSIS — E871 Hypo-osmolality and hyponatremia: Secondary | ICD-10-CM | POA: Diagnosis present

## 2023-06-18 DIAGNOSIS — S22000A Wedge compression fracture of unspecified thoracic vertebra, initial encounter for closed fracture: Principal | ICD-10-CM

## 2023-06-18 DIAGNOSIS — B9620 Unspecified Escherichia coli [E. coli] as the cause of diseases classified elsewhere: Secondary | ICD-10-CM | POA: Diagnosis present

## 2023-06-18 DIAGNOSIS — M549 Dorsalgia, unspecified: Secondary | ICD-10-CM

## 2023-06-18 DIAGNOSIS — S22080A Wedge compression fracture of T11-T12 vertebra, initial encounter for closed fracture: Secondary | ICD-10-CM

## 2023-06-18 DIAGNOSIS — N1831 Chronic kidney disease, stage 3a: Secondary | ICD-10-CM | POA: Diagnosis present

## 2023-06-18 DIAGNOSIS — W1830XA Fall on same level, unspecified, initial encounter: Secondary | ICD-10-CM | POA: Diagnosis present

## 2023-06-18 DIAGNOSIS — Y92099 Unspecified place in other non-institutional residence as the place of occurrence of the external cause: Secondary | ICD-10-CM

## 2023-06-18 DIAGNOSIS — N390 Urinary tract infection, site not specified: Secondary | ICD-10-CM | POA: Diagnosis present

## 2023-06-18 DIAGNOSIS — N9489 Other specified conditions associated with female genital organs and menstrual cycle: Secondary | ICD-10-CM | POA: Diagnosis present

## 2023-06-18 DIAGNOSIS — E663 Overweight: Secondary | ICD-10-CM | POA: Diagnosis present

## 2023-06-18 DIAGNOSIS — Z9221 Personal history of antineoplastic chemotherapy: Secondary | ICD-10-CM

## 2023-06-18 DIAGNOSIS — C859A Non-Hodgkin lymphoma, unspecified, in remission: Secondary | ICD-10-CM | POA: Diagnosis present

## 2023-06-18 DIAGNOSIS — R000 Tachycardia, unspecified: Secondary | ICD-10-CM | POA: Diagnosis present

## 2023-06-18 DIAGNOSIS — J449 Chronic obstructive pulmonary disease, unspecified: Secondary | ICD-10-CM | POA: Diagnosis present

## 2023-06-18 DIAGNOSIS — I5032 Chronic diastolic (congestive) heart failure: Secondary | ICD-10-CM | POA: Diagnosis present

## 2023-06-18 DIAGNOSIS — D72829 Elevated white blood cell count, unspecified: Secondary | ICD-10-CM | POA: Diagnosis present

## 2023-06-18 DIAGNOSIS — N179 Acute kidney failure, unspecified: Secondary | ICD-10-CM | POA: Diagnosis present

## 2023-06-18 DIAGNOSIS — Z87442 Personal history of urinary calculi: Secondary | ICD-10-CM

## 2023-06-18 DIAGNOSIS — I130 Hypertensive heart and chronic kidney disease with heart failure and stage 1 through stage 4 chronic kidney disease, or unspecified chronic kidney disease: Secondary | ICD-10-CM | POA: Diagnosis present

## 2023-06-18 DIAGNOSIS — B370 Candidal stomatitis: Secondary | ICD-10-CM | POA: Insufficient documentation

## 2023-06-18 DIAGNOSIS — Y9301 Activity, walking, marching and hiking: Secondary | ICD-10-CM | POA: Diagnosis present

## 2023-06-18 DIAGNOSIS — Z87891 Personal history of nicotine dependence: Secondary | ICD-10-CM

## 2023-06-18 DIAGNOSIS — W01198A Fall on same level from slipping, tripping and stumbling with subsequent striking against other object, initial encounter: Secondary | ICD-10-CM | POA: Diagnosis present

## 2023-06-18 DIAGNOSIS — S32010A Wedge compression fracture of first lumbar vertebra, initial encounter for closed fracture: Secondary | ICD-10-CM | POA: Diagnosis present

## 2023-06-18 DIAGNOSIS — E039 Hypothyroidism, unspecified: Secondary | ICD-10-CM | POA: Diagnosis present

## 2023-06-18 DIAGNOSIS — R338 Other retention of urine: Secondary | ICD-10-CM | POA: Insufficient documentation

## 2023-06-18 DIAGNOSIS — C8599 Non-Hodgkin lymphoma, unspecified, extranodal and solid organ sites: Secondary | ICD-10-CM | POA: Diagnosis present

## 2023-06-18 DIAGNOSIS — B379 Candidiasis, unspecified: Secondary | ICD-10-CM | POA: Diagnosis present

## 2023-06-18 DIAGNOSIS — S32019A Unspecified fracture of first lumbar vertebra, initial encounter for closed fracture: Secondary | ICD-10-CM | POA: Diagnosis present

## 2023-06-18 DIAGNOSIS — Z8249 Family history of ischemic heart disease and other diseases of the circulatory system: Secondary | ICD-10-CM

## 2023-06-18 DIAGNOSIS — Z85828 Personal history of other malignant neoplasm of skin: Secondary | ICD-10-CM

## 2023-06-18 DIAGNOSIS — F0390 Unspecified dementia without behavioral disturbance: Secondary | ICD-10-CM | POA: Diagnosis present

## 2023-06-18 DIAGNOSIS — K5900 Constipation, unspecified: Secondary | ICD-10-CM | POA: Diagnosis present

## 2023-06-18 DIAGNOSIS — Z66 Do not resuscitate: Secondary | ICD-10-CM | POA: Diagnosis present

## 2023-06-18 DIAGNOSIS — I10 Essential (primary) hypertension: Secondary | ICD-10-CM | POA: Diagnosis present

## 2023-06-18 DIAGNOSIS — F03C0 Unspecified dementia, severe, without behavioral disturbance, psychotic disturbance, mood disturbance, and anxiety: Secondary | ICD-10-CM | POA: Diagnosis present

## 2023-06-18 DIAGNOSIS — Z96653 Presence of artificial knee joint, bilateral: Secondary | ICD-10-CM | POA: Diagnosis present

## 2023-06-18 DIAGNOSIS — R1909 Other intra-abdominal and pelvic swelling, mass and lump: Secondary | ICD-10-CM | POA: Diagnosis present

## 2023-06-18 DIAGNOSIS — G9341 Metabolic encephalopathy: Secondary | ICD-10-CM | POA: Diagnosis present

## 2023-06-18 LAB — CBC WITH DIFFERENTIAL/PLATELET
Abs Immature Granulocytes: 0.26 10*3/uL — ABNORMAL HIGH (ref 0.00–0.07)
Basophils Absolute: 0 10*3/uL (ref 0.0–0.1)
Basophils Relative: 0 %
Eosinophils Absolute: 0 10*3/uL (ref 0.0–0.5)
Eosinophils Relative: 0 %
HCT: 41.8 % (ref 36.0–46.0)
Hemoglobin: 13.3 g/dL (ref 12.0–15.0)
Immature Granulocytes: 2 %
Lymphocytes Relative: 8 %
Lymphs Abs: 1.1 10*3/uL (ref 0.7–4.0)
MCH: 28.8 pg (ref 26.0–34.0)
MCHC: 31.8 g/dL (ref 30.0–36.0)
MCV: 90.5 fL (ref 80.0–100.0)
Monocytes Absolute: 0.8 10*3/uL (ref 0.1–1.0)
Monocytes Relative: 6 %
Neutro Abs: 11.5 10*3/uL — ABNORMAL HIGH (ref 1.7–7.7)
Neutrophils Relative %: 84 %
Platelets: 371 10*3/uL (ref 150–400)
RBC: 4.62 MIL/uL (ref 3.87–5.11)
RDW: 15.3 % (ref 11.5–15.5)
WBC: 13.6 10*3/uL — ABNORMAL HIGH (ref 4.0–10.5)
nRBC: 0 % (ref 0.0–0.2)

## 2023-06-18 LAB — BASIC METABOLIC PANEL WITH GFR
Anion gap: 13 (ref 5–15)
BUN: 31 mg/dL — ABNORMAL HIGH (ref 8–23)
CO2: 23 mmol/L (ref 22–32)
Calcium: 8.9 mg/dL (ref 8.9–10.3)
Chloride: 97 mmol/L — ABNORMAL LOW (ref 98–111)
Creatinine, Ser: 1.12 mg/dL — ABNORMAL HIGH (ref 0.44–1.00)
GFR, Estimated: 46 mL/min — ABNORMAL LOW (ref >60)
Glucose, Bld: 131 mg/dL — ABNORMAL HIGH (ref 70–99)
Potassium: 5.4 mmol/L — ABNORMAL HIGH (ref 3.5–5.1)
Sodium: 133 mmol/L — ABNORMAL LOW (ref 135–145)

## 2023-06-18 LAB — TROPONIN I (HIGH SENSITIVITY)
Troponin I (High Sensitivity): 10 ng/L (ref <18)
Troponin I (High Sensitivity): 7 ng/L (ref <18)

## 2023-06-18 MED ORDER — TRAMADOL HCL 50 MG PO TABS: 50.0000 mg | ORAL_TABLET | Freq: Four times a day (QID) | ORAL | 0 refills | Status: DC | PRN

## 2023-06-18 MED ORDER — FENTANYL CITRATE PF 50 MCG/ML IJ SOSY
50.0000 ug | PREFILLED_SYRINGE | Freq: Once | INTRAMUSCULAR | Status: AC
  Administered 2023-06-18: 50 ug via INTRAVENOUS
  Filled 2023-06-18 (×2): qty 1

## 2023-06-18 MED ORDER — SODIUM CHLORIDE 0.9 % IV BOLUS
500.0000 mL | Freq: Once | INTRAVENOUS | Status: AC
  Administered 2023-06-18: 500 mL via INTRAVENOUS

## 2023-06-18 NOTE — ED Notes (Signed)
 Called for Ortho Braces spoke with Jayla

## 2023-06-18 NOTE — ED Provider Notes (Signed)
 [facility] Provider Note    Event Date/Time   First MD Initiated Contact with Patient 06/18/23 1751     (approximate)   History   Fall   HPI  Monica Singh is a 89 y.o. female who presents to the emergency department today after a fall.  Apparently the patient was trying and a new rollator when she had gone a couple steps and fell backwards.  She did hit the back of her head.  She is complaining of pain to her head, neck and lower spine.  Patient did not try to ambulate afterwards.  Patient was discharged from the hospital yesterday after an admission for COPD and UTI.  Patient is still taking her antibiotics.      Physical Exam   Triage Vital Signs: ED Triage Vitals  Encounter Vitals Group     BP 06/18/23 1738 (!) 139/127     Systolic BP Percentile --      Diastolic BP Percentile --      Pulse Rate 06/18/23 1737 100     Resp 06/18/23 1737 17     Temp 06/18/23 1737 98.4 F (36.9 C)     Temp Source 06/18/23 1737 Oral     SpO2 06/18/23 1737 91 %     Weight 06/18/23 1738 143 lb 4.8 oz (65 kg)     Height 06/18/23 1738 5\' 2"  (1.575 m)     Head Circumference --      Peak Flow --      Pain Score --      Pain Loc --      Pain Education --      Exclude from Growth Chart --     Most recent vital signs: Vitals:   06/18/23 1737 06/18/23 1738  BP:  (!) 139/127  Pulse: 100   Resp: 17   Temp: 98.4 F (36.9 C)   SpO2: 91%     General: Awake, alert, not completely oriented to events CV:  Good peripheral perfusion. Regular rate and rhythm. Resp:  Normal effort. Lungs clear. Abd:  No distention.  Other:  Left leg shortened and externally rotated, however no significant tenderness with manipulation of the left hip. Tender to palpation of the lumbar spine.    ED Results / Procedures / Treatments   Labs (all labs ordered are listed, but only abnormal results are displayed) Labs Reviewed  CBC WITH DIFFERENTIAL/PLATELET - Abnormal; Notable  for the following components:      Result Value   WBC 13.6 (*)    Neutro Abs 11.5 (*)    Abs Immature Granulocytes 0.26 (*)    All other components within normal limits  BASIC METABOLIC PANEL WITH GFR - Abnormal; Notable for the following components:   Sodium 133 (*)    Potassium 5.4 (*)    Chloride 97 (*)    Glucose, Bld 131 (*)    BUN 31 (*)    Creatinine, Ser 1.12 (*)    GFR, Estimated 46 (*)    All other components within normal limits  URINALYSIS, ROUTINE W REFLEX MICROSCOPIC  TROPONIN I (HIGH SENSITIVITY)  TROPONIN I (HIGH SENSITIVITY)     EKG  I, Marylynn Soho, attending physician, personally viewed and interpreted this EKG  EKG Time: 1747 Rate: 99 Rhythm: sinus rhythm Axis: left axis deviation Intervals: qtc 482 QRS: IVCD ST changes: no st elevation Impression: abnormal ekg  RADIOLOGY I independently interpreted and visualized the lumbar spine. My interpretation: Compression fracture Radiology  interpretation:  IMPRESSION:  1. Osteopenia with treated compression deformity at L1. New superior  endplate deformity at L2 of uncertain age. Correlate for point  tenderness and consider correlation with CT.  2. Degenerative changes.    I independently interpreted and visualized the CXR. My interpretation: No pneumonia Radiology interpretation:  IMPRESSION:  No active disease.    I independently interpreted and visualized the left hip/pelvis. My interpretation: No fracture Radiology interpretation:  IMPRESSION:  1. Possible acute left superior pubic ramus fracture.  2. Multiple linear lucencies overlying the left trochanter, suspect  largely related to skin fold artifact, however given osteopenia,  recommend correlation with CT    I independently interpreted and visualized the ct head/cervical spine. My interpretation: No ICH. No fracture Radiology interpretation:  IMPRESSION:  1. No acute intracranial pathology. Small-vessel white matter  disease and  global cerebral volume loss in keeping with advanced  patient.  2. Soft tissue contusion of the left scalp vertex.  3. No fracture or static subluxation of the cervical spine.  4. Status post anterior cervical discectomy and fusion of C4-C5 with  moderate disc degenerative change of the remaining cervical levels.   I independently interpreted and visualized the lumbar spine CT. My interpretation: compression fracture Radiology interpretation:  IMPRESSION:  1. Acute appearing compression fracture involving T11 vertebral  body, involves the anterior cortex, superior and probably the  inferior endplate. No fracture lucency at the posterior arch.  2. Age indeterminate moderate superior endplate fracture at T10.  3. Age indeterminate moderate superior endplate fracture at L2.  4. Treated compression deformity at L1.  5. Levoscoliosis with multilevel degenerative changes. Moderate  severe spinal canal stenosis at L3-L4 and L4-L5.  6. Incompletely visualized 5.3 cm left adnexal cyst. Ultrasound  follow-up may be performed if desired given advanced age of patient  7. Aortic atherosclerosis.    Aortic Atherosclerosis (ICD10-I70.0).    I independently interpreted and visualized the left hip ct. My interpretation: No fracture Radiology interpretation:  IMPRESSION:  1. No CT evidence for acute osseous abnormality.  2. Moderate degenerative changes of the left hip.  3. Large left adnexal cyst measuring up to 7.3 cm. This is stable  compared with 2024 abdominopelvic CT, slightly enlarged compared to  2022 emanation. Nonemergent follow-up pelvic ultrasound may be  performed as indicated given advanced age of patient      PROCEDURES:  Critical Care performed: No    MEDICATIONS ORDERED IN ED: Medications - No data to display   IMPRESSION / MDM / ASSESSMENT AND PLAN / ED COURSE  I reviewed the triage vital signs and the nursing notes.                              Differential  diagnosis includes, but is not limited to, fracture, contusion, dislocation  Patient's presentation is most consistent with acute presentation with potential threat to life or bodily function.   Patient presented to the emergency department today after a fall.  Complaining primarily of pain in her lower back as well as some pain to her head and neck.  Patient had imaging and blood work ordered.  In terms of the imaging CT head and cervical spine without concerning abnormalities.  Left hip was concern for possible hip fracture and the lumbar spine was concerning for possible fracture.  CT scans were ordered to further evaluate.  This did not show any hip fracture but did show lower thoracic  compression fracture.  Discussed this finding with patient and family.  Will have a TLSO brace placed.  I do think if patient can ambulate at baseline with brace it would be reasonable for patient be discharged back to facility.      FINAL CLINICAL IMPRESSION(S) / ED DIAGNOSES   Final diagnoses:  Compression fracture of body of thoracic vertebra (HCC)        Rx / DC Orders      Note:  This document was prepared using Dragon voice recognition software and may include unintentional dictation errors.    Marylynn Soho, MD 06/18/23 2256

## 2023-06-18 NOTE — ED Notes (Signed)
 Patient transferred to CT

## 2023-06-18 NOTE — ED Notes (Signed)
 Patient coughing up dark colored sputum.

## 2023-06-18 NOTE — Progress Notes (Signed)
 Orthopedic Tech Progress Note Patient Details:  [name]  Monica Singh [birth year] [id number]  Patient ID: [name]  Sharmaine Dearth, female   DOB: 11/08/1928, 89 y.o.   MRN: [id number] TLSO brace ordered w/ [facility]   Mihailo Sage L Cameron Schwinn 06/18/2023, 10:40 PM

## 2023-06-18 NOTE — ED Triage Notes (Signed)
 Patient to ED via [facility] from [facility] after a fall. Family reports patient lost her balance and fell hitting her head on a book shelf. Denies LOC or blood thinners. Had 1 episode of vomiting since and c/o back pain. Given 4mg  zofran  20 R hand.   128/74 127 cbg 94% rA 80-120 afib

## 2023-06-19 DIAGNOSIS — S22080S Wedge compression fracture of T11-T12 vertebra, sequela: Secondary | ICD-10-CM

## 2023-06-19 DIAGNOSIS — S32010A Wedge compression fracture of first lumbar vertebra, initial encounter for closed fracture: Secondary | ICD-10-CM | POA: Diagnosis not present

## 2023-06-19 DIAGNOSIS — I5032 Chronic diastolic (congestive) heart failure: Secondary | ICD-10-CM

## 2023-06-19 DIAGNOSIS — N179 Acute kidney failure, unspecified: Secondary | ICD-10-CM

## 2023-06-19 DIAGNOSIS — W1830XA Fall on same level, unspecified, initial encounter: Secondary | ICD-10-CM

## 2023-06-19 DIAGNOSIS — E663 Overweight: Secondary | ICD-10-CM

## 2023-06-19 DIAGNOSIS — N200 Calculus of kidney: Secondary | ICD-10-CM

## 2023-06-19 DIAGNOSIS — J439 Emphysema, unspecified: Secondary | ICD-10-CM

## 2023-06-19 DIAGNOSIS — N390 Urinary tract infection, site not specified: Secondary | ICD-10-CM

## 2023-06-19 DIAGNOSIS — N9489 Other specified conditions associated with female genital organs and menstrual cycle: Secondary | ICD-10-CM

## 2023-06-19 DIAGNOSIS — C8599 Non-Hodgkin lymphoma, unspecified, extranodal and solid organ sites: Secondary | ICD-10-CM

## 2023-06-19 DIAGNOSIS — B9620 Unspecified Escherichia coli [E. coli] as the cause of diseases classified elsewhere: Secondary | ICD-10-CM

## 2023-06-19 DIAGNOSIS — S22080A Wedge compression fracture of T11-T12 vertebra, initial encounter for closed fracture: Secondary | ICD-10-CM

## 2023-06-19 DIAGNOSIS — E871 Hypo-osmolality and hyponatremia: Secondary | ICD-10-CM

## 2023-06-19 LAB — CBC
HCT: 37.4 % (ref 36.0–46.0)
Hemoglobin: 11.9 g/dL — ABNORMAL LOW (ref 12.0–15.0)
MCH: 28.8 pg (ref 26.0–34.0)
MCHC: 31.8 g/dL (ref 30.0–36.0)
MCV: 90.6 fL (ref 80.0–100.0)
Platelets: 333 10*3/uL (ref 150–400)
RBC: 4.13 MIL/uL (ref 3.87–5.11)
RDW: 15.2 % (ref 11.5–15.5)
WBC: 10.5 10*3/uL (ref 4.0–10.5)
nRBC: 0 % (ref 0.0–0.2)

## 2023-06-19 LAB — BASIC METABOLIC PANEL WITH GFR
Anion gap: 7 (ref 5–15)
BUN: 29 mg/dL — ABNORMAL HIGH (ref 8–23)
CO2: 26 mmol/L (ref 22–32)
Calcium: 8.4 mg/dL — ABNORMAL LOW (ref 8.9–10.3)
Chloride: 100 mmol/L (ref 98–111)
Creatinine, Ser: 0.91 mg/dL (ref 0.44–1.00)
GFR, Estimated: 59 mL/min — ABNORMAL LOW (ref >60)
Glucose, Bld: 116 mg/dL — ABNORMAL HIGH (ref 70–99)
Potassium: 4.3 mmol/L (ref 3.5–5.1)
Sodium: 133 mmol/L — ABNORMAL LOW (ref 135–145)

## 2023-06-19 MED ORDER — SENNOSIDES-DOCUSATE SODIUM 8.6-50 MG PO TABS
1.0000 | ORAL_TABLET | Freq: Every day | ORAL | Status: DC
  Administered 2023-06-19 – 2023-06-23 (×4): 1 via ORAL
  Filled 2023-06-19 (×4): qty 1

## 2023-06-19 MED ORDER — GUAIFENESIN ER 600 MG PO TB12
1200.0000 mg | ORAL_TABLET | Freq: Two times a day (BID) | ORAL | Status: DC | PRN
  Administered 2023-06-22: 1200 mg via ORAL
  Filled 2023-06-19: qty 2

## 2023-06-19 MED ORDER — ACETAMINOPHEN 325 MG PO TABS
650.0000 mg | ORAL_TABLET | Freq: Four times a day (QID) | ORAL | Status: DC | PRN
  Administered 2023-06-20 (×2): 650 mg via ORAL
  Filled 2023-06-19 (×2): qty 2

## 2023-06-19 MED ORDER — MORPHINE SULFATE (PF) 2 MG/ML IV SOLN: 1.0000 mg | INTRAVENOUS | Status: DC | PRN

## 2023-06-19 MED ORDER — ONDANSETRON HCL 4 MG PO TABS: 4.0000 mg | ORAL_TABLET | Freq: Four times a day (QID) | ORAL | Status: DC | PRN

## 2023-06-19 MED ORDER — ALBUTEROL SULFATE (2.5 MG/3ML) 0.083% IN NEBU: 2.5000 mg | INHALATION_SOLUTION | Freq: Four times a day (QID) | RESPIRATORY_TRACT | Status: DC | PRN

## 2023-06-19 MED ORDER — ONDANSETRON HCL 4 MG/2ML IJ SOLN
4.0000 mg | Freq: Four times a day (QID) | INTRAMUSCULAR | Status: DC | PRN
Start: 2023-06-19 — End: 2023-06-26

## 2023-06-19 MED ORDER — HYDROCODONE-ACETAMINOPHEN 5-325 MG PO TABS: 1.0000 | ORAL_TABLET | ORAL | Status: DC | PRN

## 2023-06-19 MED ORDER — BUDESON-GLYCOPYRROL-FORMOTEROL 160-9-4.8 MCG/ACT IN AERO
2.0000 | INHALATION_SPRAY | Freq: Two times a day (BID) | RESPIRATORY_TRACT | Status: DC
  Administered 2023-06-19 – 2023-06-26 (×13): 2 via RESPIRATORY_TRACT
  Filled 2023-06-19 (×3): qty 5.9

## 2023-06-19 MED ORDER — PREDNISONE 20 MG PO TABS
40.0000 mg | ORAL_TABLET | Freq: Every day | ORAL | Status: DC
  Administered 2023-06-19 – 2023-06-20 (×2): 40 mg via ORAL
  Filled 2023-06-19 (×2): qty 2

## 2023-06-19 MED ORDER — CALCITONIN (SALMON) 200 UNIT/ACT NA SOLN
1.0000 | Freq: Every day | NASAL | Status: DC
  Administered 2023-06-20 – 2023-06-26 (×6): 1 via NASAL
  Filled 2023-06-19 (×4): qty 3.7

## 2023-06-19 MED ORDER — ACETAMINOPHEN 650 MG RE SUPP: 650.0000 mg | Freq: Four times a day (QID) | RECTAL | Status: DC | PRN

## 2023-06-19 MED ORDER — CEFADROXIL 500 MG PO CAPS
500.0000 mg | ORAL_CAPSULE | Freq: Two times a day (BID) | ORAL | Status: DC
  Administered 2023-06-19 – 2023-06-22 (×7): 500 mg via ORAL
  Filled 2023-06-19 (×9): qty 1

## 2023-06-19 MED ORDER — ALBUTEROL SULFATE (2.5 MG/3ML) 0.083% IN NEBU
2.5000 mg | INHALATION_SOLUTION | Freq: Four times a day (QID) | RESPIRATORY_TRACT | Status: DC
  Administered 2023-06-19 – 2023-06-21 (×7): 2.5 mg via RESPIRATORY_TRACT
  Filled 2023-06-19 (×7): qty 3

## 2023-06-19 MED ORDER — MORPHINE SULFATE (PF) 2 MG/ML IV SOLN: 2.0000 mg | INTRAVENOUS | Status: DC | PRN

## 2023-06-19 MED ORDER — MORPHINE SULFATE (PF) 2 MG/ML IV SOLN
2.0000 mg | Freq: Once | INTRAVENOUS | Status: AC
  Administered 2023-06-19: 2 mg via INTRAVENOUS
  Filled 2023-06-19: qty 1

## 2023-06-19 MED ORDER — ENOXAPARIN SODIUM 30 MG/0.3ML IJ SOSY
30.0000 mg | PREFILLED_SYRINGE | INTRAMUSCULAR | Status: DC
  Administered 2023-06-19: 30 mg via SUBCUTANEOUS
  Filled 2023-06-19: qty 0.3

## 2023-06-19 NOTE — Assessment & Plan Note (Addendum)
 Creatinine 1.12 up from baseline of 0.74 Repeat creatinine down to 0.80

## 2023-06-19 NOTE — Assessment & Plan Note (Deleted)
 Ground-level fall while using rollator History of kyphoplasty 2019 Multimodal pain control-with IV and oral medication. Continue TLSO brace with working with physical therapy. PT OT consult

## 2023-06-19 NOTE — Progress Notes (Signed)
 Anticoagulation monitoring(Lovenox ):  89 yo  female ordered Lovenox  40 mg Q24h    Filed Weights   06/18/23 1738  Weight: 65 kg (143 lb 4.8 oz)   BMI 26.2   Lab Results  Component Value Date   CREATININE 1.12 (H) 06/18/2023   CREATININE 0.74 06/17/2023   CREATININE 0.95 06/16/2023   Estimated Creatinine Clearance: 28.4 mL/min (A) (by C-G formula based on SCr of 1.12 mg/dL (H)). Hemoglobin & Hematocrit     Component Value Date/Time   HGB 13.3 06/18/2023 1852   HGB 13.7 12/22/2019 1050   HCT 41.8 06/18/2023 1852   HCT 40.6 12/22/2019 1050     Per Protocol for Patient with estCrcl < 30 ml/min and BMI < 40, will transition to Lovenox  30 mg Q24h.

## 2023-06-19 NOTE — Evaluation (Signed)
 Occupational Therapy Evaluation Patient Details Name: NUBIA ZIESMER MRN: [id number] DOB: 1928-03-10 Today's Date: 06/19/2023   History of Present Illness   Pt is a 89 y.o. female who presents from memory care unit s/p fall while attempting to ambulate with her new rollator. Admitted for management of  L1 vertebral fracture, AKI, UTI. TLSO when OOB. PMH of COPD , diastolic CHF, CKD stage IIIa, dementia, non-Hodgkin's lymphoma in remission, HTN, hypothyroidism, left UPJ obstruction s.p stent on 5/2, and discharged 2 days ago from hospitalization from 5/17-5/20 for COPD/CHF exacerbation and E. coli UTI     Clinical Impressions Pt was seen for OT evaluation this date. PTA, pt is a resident at [facility] where she has assist with all ADLs and supervision to ambulate with a RW/rollator. Per daughter, recently switched to rollator d/t c/o back pain using RW.    Pt presents to acute OT demonstrating impaired ADL performance and functional mobility 2/2 weakness, pain, balance deficits and low activity tolerance. Pt currently requires Max to total assist for all bed mobility with cueing for bed rail use and edu on log roll technique. TLSO donned while seated at EOB with total assist and adjusted for better fit. Max A needed for forward scoot and Mod/Max A for STS x3 trials from elevated bed height. Less than 30 seconds standing tolerance, but able to take lateral steps to [name] with Mod A, constant cueing and RW management. LB dressing required total/Max A via STS to pull over hips. HR up to 105 with activity and sp02 at 92% on 2L.  Pt would benefit from skilled OT services to address noted impairments and functional limitations to maximize safety and independence while minimizing falls risk and caregiver burden. Do anticipate the need for follow up OT services upon acute hospital DC.      If plan is discharge home, recommend the following:   A lot of help with walking and/or transfers;A lot of  help with bathing/dressing/bathroom     Functional Status Assessment   Patient has had a recent decline in their functional status and demonstrates the ability to make significant improvements in function in a reasonable and predictable amount of time.     Equipment Recommendations   Other (comment) (defer)     Recommendations for Other Services         Precautions/Restrictions   Precautions Precautions: Fall;Back Recall of Precautions/Restrictions: Impaired Precaution/Restrictions Comments: TLSO OOB Required Braces or Orthoses: Other Brace Other Brace: TLSO when OOB/ambulating     Mobility Bed Mobility Overal bed mobility: Needs Assistance Bed Mobility: Rolling, Sidelying to Sit, Sit to Sidelying Rolling: Max assist, Used rails Sidelying to sit: Max assist, HOB elevated, Used rails Supine to sit: Total assist     General bed mobility comments: cueing for use of bed rails, pt very limited by pain and hearing impairment; Max A to forward scoot to prepare for standing    Transfers Overall transfer level: Needs assistance Equipment used: Rolling walker (2 wheels) Transfers: Sit to/from Stand Sit to Stand: From elevated surface, Max assist, Mod assist           General transfer comment: bed height elevated, allowed pt to place hands on RW, short periods of standing tolerated ~30 seconds max x3 trials with ability to take a few small lateral steps with increased time, cueing and assist for RW management      Balance Overall balance assessment: Needs assistance Sitting-balance support: Feet supported, Bilateral upper extremity supported Sitting balance-Leahy Scale:  Poor Sitting balance - Comments: mod A progressing to Min/CGA to maintain seated balance at EOB Postural control: Posterior lean Standing balance support: Reliant on assistive device for balance, Bilateral upper extremity supported Standing balance-Leahy Scale: Poor Standing balance comment:  Min/Mod A to maintain standing balance with fatigue                           ADL either performed or assessed with clinical judgement   ADL Overall ADL's : Needs assistance/impaired                     Lower Body Dressing: Maximal assistance;Sit to/from stand;Sitting/lateral leans Lower Body Dressing Details (indicate cue type and reason): doff/don pull up Toilet Transfer: Moderate assistance;Maximal assistance;Rolling walker (2 wheels) Toilet Transfer Details (indicate cue type and reason): simulated from elevated bed height           General ADL Comments: TLSO donned while seated at EOB     Vision         Perception         Praxis         Pertinent Vitals/Pain Pain Assessment Pain Assessment: Faces Faces Pain Scale: Hurts whole lot Pain Location: back Pain Descriptors / Indicators: Discomfort, Guarding, Grimacing Pain Intervention(s): Monitored during session, Repositioned, Patient requesting pain meds-RN notified, Limited activity within patient's tolerance     Extremity/Trunk Assessment Upper Extremity Assessment Upper Extremity Assessment: Generalized weakness   Lower Extremity Assessment Lower Extremity Assessment: Generalized weakness   Cervical / Trunk Assessment Cervical / Trunk Assessment: Other exceptions Cervical / Trunk Exceptions: L1 compression fx to wear TLSO when OOB   Communication Communication Communication: Impaired Factors Affecting Communication: Hearing impaired (heraing aides)   Cognition Arousal: Lethargic Behavior During Therapy: Flat affect Cognition: History of cognitive impairments                               Following commands: Impaired Following commands impaired: Follows one step commands with increased time     Cueing  General Comments   Cueing Techniques: Verbal cues;Gestural cues;Tactile cues;Visual cues  HR up to 105, sp02 92% on 02 with activity   Exercises Other  Exercises Other Exercises: Edu on role of OT in acute setting, log roll technique, and brace wear.   Shoulder Instructions      Home Living Family/patient expects to be discharged to:: Skilled nursing facility                                 Additional Comments: [facility]      Prior Functioning/Environment Prior Level of Function : Needs assist  Cognitive Assist : Mobility (cognitive);ADLs (cognitive)     Physical Assist : Mobility (physical);ADLs (physical)     Mobility Comments: Pt ambulatory with RW, short distances only due to baseline dyspnea; supervision at all times?? ADLs Comments: has assist at SNF for all ADLs    OT Problem List: Decreased strength;Pain;Impaired balance (sitting and/or standing);Decreased activity tolerance   OT Treatment/Interventions: Self-care/ADL training;Balance training;Therapeutic exercise;Therapeutic activities;Patient/family education      OT Goals(Current goals can be found in the care plan section)   Acute Rehab OT Goals Patient Stated Goal: improve pain and function OT Goal Formulation: With patient/family Time For Goal Achievement: 07/03/23 Potential to Achieve Goals: Fair ADL Goals Pt Will  Perform Grooming: sitting;with set-up Pt Will Perform Upper Body Dressing: with set-up;sitting Pt Will Transfer to Toilet: with min assist;bedside commode;stand pivot transfer;ambulating   OT Frequency:  Min 2X/week    Co-evaluation              AM-PAC OT "6 Clicks" Daily Activity     Outcome Measure Help from another person eating meals?: None Help from another person taking care of personal grooming?: A Little Help from another person toileting, which includes using toliet, bedpan, or urinal?: Total Help from another person bathing (including washing, rinsing, drying)?: A Lot Help from another person to put on and taking off regular upper body clothing?: A Lot Help from another person to put on and taking  off regular lower body clothing?: A Lot 6 Click Score: 14   End of Session Equipment Utilized During Treatment: Rolling walker (2 wheels) Nurse Communication: Mobility status  Activity Tolerance: Patient limited by pain;Patient tolerated treatment well Patient left: in bed;with call bell/phone within reach;with bed alarm set;with family/visitor present  OT Visit Diagnosis: Other abnormalities of gait and mobility (R26.89);Unsteadiness on feet (R26.81);Muscle weakness (generalized) (M62.81);History of falling (Z91.81);Pain                Time: 7846-9629 OT Time Calculation (min): 29 min Charges:  OT General Charges $OT Visit: 1 Visit OT Evaluation $OT Eval Moderate Complexity: 1 Mod OT Treatments $Self Care/Home Management : 8-22 mins Shaleah Nissley, OTR/L 06/19/23, 1:20 PM  Ermal Haberer E Merle Cirelli 06/19/2023, 1:15 PM

## 2023-06-19 NOTE — Assessment & Plan Note (Signed)
 No acute issues suspected

## 2023-06-19 NOTE — Progress Notes (Signed)
 Progress Note   Patient: Monica Singh GMW:[id number] DOB: 03-24-1928 DOA: 06/18/2023     0 DOS: the patient was seen and examined on 06/19/2023   Brief hospital course: 89 y.o. female with medical history significant for COPD , diastolic CHF, CKD stage IIIa, dementia, non-Hodgkin's lymphoma in remission, HTN, hypothyroidism, left UPJ obstruction status post stent on 5/2, and discharged 2 days ago from hospitalization from 5/17 to 06/17/2023 for COPD/CHF exacerbation and E. coli UTI, still on antibiotics who presents from her assisted memory care unit following a fall while attempting to ambulate with her new rollator.  She fell backwards, hitting her head and had new onset of back pain.  Daughter at bedside contributes to history. ED course and data review: Tachycardic to about 103 with otherwise unremarkable vitals Labs notable for mild leukocytosis Of 13,600 slightly up from 11.3 on 5/20 Creatinine 1.12 up from 0.74 Potassium 5.4 Troponin 7 EKG, personally viewed and interpreted showing sinus at 99 with nonspecific T wave changes Trauma imaging included CT head, C-spine, L-spine and hip, as well as chest x-ray-which was significant for a new superior L1 endplate  fracture and a stable left adnexal cyst of 7.3 cm   Patient was treated with fentanyl  for pain Given a 500 cc NS bolus Fitted with a TLSO brace, but was unable to ambulate with the brace so admission requested   5/22.  Family would like to do conservative management with pain control physical therapy and Occupational Therapy evaluations.   Assessment and Plan: * Compression fracture of T11 vertebra (HCC) Ground-level fall while using rollator History of kyphoplasty 2019 Multimodal pain control-with IV and oral medication. Continue TLSO brace with working with physical therapy. PT OT consult  AKI (acute kidney injury) (HCC) Creatinine 1.12 up from baseline of 0.74 Received a 500 mL NS bolus in the ED Repeat creatinine  down to 0.91.  E-coli UTI Continue cefadroxil to completion on 5/23--recently treated inpatient for UTI from 5/17 to 06/17/2023  Diastolic CHF, chronic (HCC) Clinically euvolemic to dry   Hyponatremia Sodium 2 points less than the normal range  COPD (chronic obstructive pulmonary disease) (HCC) Not acutely exacerbated, though had recent exacerbation Continue controller inhalers with DuoNebs as needed Will continue prednisone  from recent COPD exacerbation due to finish on 5/24 She does have upper airway congestion.  Adnexal mass 5.3 cm left adnexal mass.  Present since 2020.  Overweight (BMI 25.0-29.9) BMI 26.21  Nephrolithiasis History of cystoscopy with ureteral stent placement on 05/30/2023   Advanced dementia [facility]) Delirium precautions  Lymphoma of small bowel (HCC) No acute issues suspected        Subjective: Patient does not remember what happened.  As per family she was at the memory care she got up with some assistance took 2 steps forward and then fell backward between a bookcase and recliner.  She hit her head and back and complains of back pain right away.  Found to have a T11 fracture.  Physical Exam: Vitals:   06/19/23 0330 06/19/23 0521 06/19/23 0851 06/19/23 1215  BP: 138/84  131/72 139/72  Pulse: 92  97 98  Resp: 16  17 16   Temp:  (!) 97.5 F (36.4 C) 98.1 F (36.7 C) 98.3 F (36.8 C)  TempSrc:  Oral Oral   SpO2: 97%  97% 97%  Weight:      Height:       Physical Exam HENT:     Head: Normocephalic.  Eyes:     General: Lids  are normal.     Conjunctiva/sclera: Conjunctivae normal.  Cardiovascular:     Rate and Rhythm: Normal rate and regular rhythm.     Heart sounds: Normal heart sounds, S1 normal and S2 normal.  Pulmonary:     Breath sounds: Transmitted upper airway sounds present. Examination of the right-lower field reveals decreased breath sounds. Examination of the left-lower field reveals decreased breath sounds. Decreased breath sounds  present. No wheezing or rhonchi.     Comments: Upper airway congestion Abdominal:     Palpations: Abdomen is soft.     Tenderness: There is no abdominal tenderness.  Musculoskeletal:     Right lower leg: No swelling.     Left lower leg: No swelling.  Skin:    General: Skin is warm.     Findings: No rash.  Neurological:     Mental Status: She is alert.     Comments: Barely able to straight leg raise bilaterally.     Data Reviewed: Sodium 133, creatinine 0.91, GFR 59, hemoglobin 11.9, white blood cell count 10.5 CT scan of the lumbar spine shows acute appearing compression fracture involving T11, treated compression fracture L1, moderate to severe spinal canal stenosis at L3-L4 and L4-L5, incompletely visualized left adnexal cyst.  Family Communication: Spoke with son and daughter at the bedside  Disposition: Status is: Observation Family would like to do conservative management pain control and physical therapy.  Planned Discharge Destination: Rehab    Time spent: 28 minutes  Author: Verla Glaze, MD 06/19/2023 1:02 PM  For on call review [email or web address].

## 2023-06-19 NOTE — ED Notes (Signed)
 Attempted ambulation with pt. Pt was able to stand with two person assist but unable to ambulate.

## 2023-06-19 NOTE — Assessment & Plan Note (Addendum)
 Continue cefadroxil to completion on 5/24

## 2023-06-19 NOTE — Evaluation (Signed)
 Physical Therapy Evaluation Patient Details Name: Monica Singh  RONIQUA KINTZ MRN: [id number] DOB: [birth year] Today's Date: 06/19/2023  History of Present Illness  Pt is a 89 y.o. female who presents from memory care unit s/p fall while attempting to ambulate with her new rollator. Admitted for management of  L1 vertebral fracture, AKI, UTI. TLSO when OOB. PMH of COPD , diastolic CHF, CKD stage IIIa, dementia, non-Hodgkin's lymphoma in remission, HTN, hypothyroidism, left UPJ obstruction s.p stent on 5/2, and discharged 2 days ago from hospitalization from 5/17-5/20 for COPD/CHF exacerbation and E. coli UTI  Clinical Impression  Pt in and out of sleep on arrival, no pain meds received, appears comfortable when not moving, DTR agrees. Author prioritized assessedment of bed mobility in spite of this due to present of donned TLSO which should not be worn in bed, hence author assists with removal. Pt able to tolated EOB poorly <2-3 minutes, then is quite fatigued, assisted back to bed. Coordinated analgesia recommenatin prior to OT eval. Will continue to follow.       If plan is discharge home, recommend the following:     Can travel by private vehicle        Equipment Recommendations None recommended by PT  Recommendations for Other Services       Functional Status Assessment Patient has had a recent decline in their functional status and demonstrates the ability to make significant improvements in function in a reasonable and predictable amount of time.     Precautions / Restrictions Precautions Precautions: Fall;Back Recall of Precautions/Restrictions: Impaired Precaution/Restrictions Comments: TLSO walking      Mobility  Bed Mobility Overal bed mobility: Needs Assistance Bed Mobility: Supine to Sit, Sit to Supine Rolling: Max assist Sidelying to sit: Max assist            Transfers Overall transfer level:  (deferred due to somnolence and lack of pain medication)                       Ambulation/Gait                  Stairs            Wheelchair Mobility     Tilt Bed    Modified Rankin (Stroke Patients Only)       Balance                                             Pertinent Vitals/Pain Pain Assessment Pain Assessment: Faces Faces Pain Scale: Hurts even more    Home Living Family/patient expects to be discharged to:: Skilled nursing facility                   Additional Comments: [facility]    Prior Function Prior Level of Function : Needs assist  Cognitive Assist : Mobility (cognitive);ADLs (cognitive)     Physical Assist : Mobility (physical);ADLs (physical)     Mobility Comments: Pt ambulatory with RW, short distances only due to baseline dyspnea; supervision at all times?? ADLs Comments: has assist at SNF for all ADLs     Extremity/Trunk Assessment   Upper Extremity Assessment Upper Extremity Assessment: Generalized weakness    Lower Extremity Assessment Lower Extremity Assessment: Generalized weakness    Cervical / Trunk Assessment Cervical / Trunk Assessment: Other exceptions Cervical / Trunk Exceptions: L1 compression fx  to wear TLSO when OOB  Communication   Communication Communication: Impaired Factors Affecting Communication: Hearing impaired (heraing aides)    Cognition                                         Cueing       General Comments General comments (skin integrity, edema, etc.): HR up to 105, sp02 92% on 02 with activity    Exercises     Assessment/Plan    PT Assessment Patient needs continued PT services  PT Problem List Decreased strength;Decreased range of motion;Decreased activity tolerance;Decreased balance;Decreased mobility;Decreased cognition;Decreased knowledge of use of DME;Decreased safety awareness       PT Treatment Interventions DME instruction;Gait training;Stair training;Functional mobility  training;Therapeutic activities;Therapeutic exercise;Balance training;Cognitive remediation;Patient/family education    PT Goals (Current goals can be found in the Care Plan section)  Acute Rehab PT Goals Patient Stated Goal: regain strength and mobility PT Goal Formulation: With patient Time For Goal Achievement: 07/03/23 Potential to Achieve Goals: Good    Frequency Min 2X/week     Co-evaluation               AM-PAC PT "6 Clicks" Mobility  Outcome Measure Help needed turning from your back to your side while in a flat bed without using bedrails?: Total Help needed moving from lying on your back to sitting on the side of a flat bed without using bedrails?: Total Help needed moving to and from a bed to a chair (including a wheelchair)?: Total Help needed standing up from a chair using your arms (e.g., wheelchair or bedside chair)?: Total Help needed to walk in hospital room?: Total Help needed climbing 3-5 steps with a railing? : Total 6 Click Score: 6    End of Session Equipment Utilized During Treatment: Back brace Activity Tolerance: Patient limited by pain;Patient limited by lethargy Patient left: with family/visitor present;with call bell/phone within reach;in bed Nurse Communication: Mobility status PT Visit Diagnosis: Difficulty in walking, not elsewhere classified (R26.2);Other abnormalities of gait and mobility (R26.89);Muscle weakness (generalized) (M62.81);Other symptoms and signs involving the nervous system (R29.898)    Time: 1610-9604 PT Time Calculation (min) (ACUTE ONLY): 25 min   Charges:   PT Evaluation $PT Eval Moderate Complexity: 1 Mod   PT General Charges $$ ACUTE PT VISIT: 1 Visit        4:54 PM, 06/19/23 Dawn Eth, PT, DPT Physical Therapist - [facility]  [phone] (ASCOM)    Demetrus Pavao C 06/19/2023, 4:53 PM

## 2023-06-19 NOTE — Assessment & Plan Note (Addendum)
 Sodium 131

## 2023-06-19 NOTE — Assessment & Plan Note (Addendum)
 Clinically euvolemic to dry.  EF 60%

## 2023-06-19 NOTE — Assessment & Plan Note (Addendum)
 History of cystoscopy with ureteral stent placement on 05/30/2023

## 2023-06-19 NOTE — Hospital Course (Addendum)
 89 y.o. female with medical history significant for COPD , diastolic CHF, CKD stage IIIa, dementia, non-Hodgkin's lymphoma in remission, HTN, hypothyroidism, left UPJ obstruction status post stent on 5/2, and discharged 2 days ago from hospitalization from 5/17 to 06/17/2023 for COPD/CHF exacerbation and E. coli UTI, still on antibiotics who presents from her assisted memory care unit following a fall while attempting to ambulate with her new rollator.  She fell backwards, hitting her head and had new onset of back pain.  Daughter at bedside contributes to history. ED course and data review: Tachycardic to about 103 with otherwise unremarkable vitals Labs notable for mild leukocytosis Of 13,600 slightly up from 11.3 on 5/20 Creatinine 1.12 up from 0.74 Potassium 5.4 Troponin 7 EKG, personally viewed and interpreted showing sinus at 99 with nonspecific T wave changes Trauma imaging included CT head, C-spine, L-spine and hip, as well as chest x-ray-which was significant for a new superior L1 endplate  fracture and a stable left adnexal cyst of 7.3 cm   Patient was treated with fentanyl  for pain Given a 500 cc NS bolus Fitted with a TLSO brace, but was unable to ambulate with the brace so admission requested   5/22.  Family would like to do conservative management with pain control physical therapy and Occupational Therapy evaluations. 5/23.  Patient having a lot of pain and not doing much with physical therapy.  Consulted interventional radiology for kyphoplasty. 5/24.  Family concerned that patient is sleeping a lot.  Will get rid of IV pain medication. 9/25.  Patient not having pain if she does not move.  Patient awake this morning.  Still has upper airway congestion.

## 2023-06-19 NOTE — Hospital Course (Signed)
 Monica Singh

## 2023-06-19 NOTE — ED Provider Notes (Signed)
-----------------------------------------   12:29 AM on 06/19/2023 -----------------------------------------   TLSO brace placed, unfortunately patient was unable to even get out of bed much less ambulate.  Will consult hospitalist services for evaluation and admission.   Zarya Lasseigne J, MD 06/19/23 [phone]

## 2023-06-19 NOTE — Assessment & Plan Note (Signed)
 BMI 26.21

## 2023-06-19 NOTE — Assessment & Plan Note (Addendum)
 Not acutely exacerbated, though had recent exacerbation.  Patient has upper airway congestion. Continue controller inhalers with DuoNebs as needed Will continue prednisone  taper from recent COPD exacerbation.

## 2023-06-19 NOTE — H&P (Signed)
 History and Physical    Patient: Monica Singh  SERAYA JOBST EXB:[id number] DOB: 03-23-1928 DOA: 06/18/2023 DOS: the patient was seen and examined on 06/19/2023 PCP: Lamon Pillow, MD  Patient coming from: ALF/ILF  Chief Complaint:  Chief Complaint  Patient presents with   Fall    HPI: Monica Singh  H Leach is a 89 y.o. female with medical history significant for COPD , diastolic CHF, CKD stage IIIa, dementia, non-Hodgkin's lymphoma in remission, HTN, hypothyroidism, left UPJ obstruction status post stent on 5/2, and discharged 2 days ago from hospitalization from 5/17 to 06/17/2023 for COPD/CHF exacerbation and E. coli UTI, still on antibiotics who presents from her assisted memory care unit following a fall while attempting to ambulate with her new rollator.  She fell backwards, hitting her head and had new onset of back pain.  Daughter at bedside contributes to history. ED course and data review: Tachycardic to about 103 with otherwise unremarkable vitals Labs notable for mild leukocytosis Of 13,600 slightly up from 11.3 on 5/20 Creatinine 1.12 up from 0.74 Potassium 5.4 Troponin 7 EKG, personally viewed and interpreted showing sinus at 99 with nonspecific T wave changes Trauma imaging included CT head, C-spine, L-spine and hip, as well as chest x-ray-which was significant for a new superior L1 endplate  fracture and a stable left adnexal cyst of 7.3 cm  Patient was treated with fentanyl  for pain Given a 500 cc NS bolus Fitted with a TLSO brace, but was unable to ambulate with the brace so admission requested       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   NON-HODGKINS-stage 3, cd 20 positive; status post 6  cycles of R-CHOP   DNR (do not resuscitate)    Dyspnea    with exertion   Essential hypertension 04/10/2022   Herpes zoster without complication 01/31/2009   History of hiatal hernia    HOH (hard of hearing)    wears hearing aides   Hypothyroidism    Mitral regurgitation    Non Hodgkin's lymphoma (HCC)    Nursing home resident    Obstruction of left ureteropelvic junction (UPJ)    Personal history of chemotherapy    PONV (postoperative nausea and vomiting)    in January lasted about 4 hours   Recurrent UTI    Squamous cell carcinoma of skin 11/09/2008   Right post. lat. elbow. SCCis arising in [place]. Excised 01/04/2009, margins free.    Squamous cell carcinoma of skin 07/26/2020   right hand   Syncope    Past Surgical History:  Procedure Laterality Date   ABDOMINAL SURGERY  2009   abdominal mass+ NH lymphoma,   BACK SURGERY  01/2017   fusion. metal plate in neck at back   CATARACT EXTRACTION  1997   right eye and left ey   cervical neck fusion  1995   CYSTOSCOPY W/ RETROGRADES Left 01/30/2018   Procedure: CYSTOSCOPY WITH RETROGRADE PYELOGRAM;  Surgeon: Lawerence Pressman, MD;  Location: [facility];  Service: Urology;  Laterality: Left;   CYSTOSCOPY W/ RETROGRADES Left 08/05/2018   Procedure: CYSTOSCOPY WITH RETROGRADE PYELOGRAM;  Surgeon: Lawerence Pressman, MD;  Location: [facility];  Service: Urology;  Laterality: Left;   CYSTOSCOPY W/ RETROGRADES Left 06/01/2021   Procedure: CYSTOSCOPY WITH RETROGRADE PYELOGRAM;  Surgeon: Lawerence Pressman, MD;  Location: [facility];  Service: Urology;  Laterality: Left;   CYSTOSCOPY W/ URETERAL STENT PLACEMENT Left 08/05/2018   Procedure: CYSTOSCOPY WITH STENT Exchange;  Surgeon: Lawerence Pressman, MD;  Location: [facility];  Service: Urology;  Laterality: Left;   CYSTOSCOPY W/ URETERAL STENT PLACEMENT Left 05/21/2019   Procedure: CYSTOSCOPY WITH STENT REPLACEMENT;  Surgeon: Lawerence Pressman, MD;  Location: [facility];  Service: Urology;  Laterality: Left;    CYSTOSCOPY W/ URETERAL STENT PLACEMENT Left 05/05/2020   Procedure: CYSTOSCOPY WITH RETROGRADE PYELOGRAM/URETERAL STENT EXCHANGE;  Surgeon: Lawerence Pressman, MD;  Location: [facility];  Service: Urology;  Laterality: Left;   CYSTOSCOPY W/ URETERAL STENT PLACEMENT Left 06/01/2021   Procedure: CYSTOSCOPY WITH STENT EXCHANGE;  Surgeon: Lawerence Pressman, MD;  Location: [facility];  Service: Urology;  Laterality: Left;   CYSTOSCOPY W/ URETERAL STENT PLACEMENT Left 05/30/2023   Procedure: CYSTOSCOPY, FLEXIBLE, WITH STENT REPLACEMENT;  Surgeon: Lawerence Pressman, MD;  Location: [facility];  Service: Urology;  Laterality: Left;   CYSTOSCOPY WITH BIOPSY Left 02/27/2018   Procedure: CYSTOSCOPY WITH BIOPSY;  Surgeon: Lawerence Pressman, MD;  Location: [facility];  Service: Urology;  Laterality: Left;   CYSTOSCOPY WITH STENT PLACEMENT Left 01/30/2018   Procedure: CYSTOSCOPY WITH STENT PLACEMENT;  Surgeon: Lawerence Pressman, MD;  Location: [facility];  Service: Urology;  Laterality: Left;   CYSTOSCOPY WITH STENT PLACEMENT Left 07/12/2022   Procedure: CYSTOSCOPY WITH STENT EXCHANGE;  Surgeon: Lawerence Pressman, MD;  Location: [facility];  Service: Urology;  Laterality: Left;   CYSTOSCOPY WITH URETEROSCOPY AND STENT PLACEMENT Left 02/27/2018   Procedure: CYSTOSCOPY WITH URETEROSCOPY AND STENT Exchange;  Surgeon: Lawerence Pressman, MD;  Location: [facility];  Service: Urology;  Laterality: Left;   DENTAL SURGERY     screws   JOINT REPLACEMENT Left    knee   KNEE ARTHROPLASTY Right 12/23/2018   Procedure: RIGHT COMPUTER ASSISTED TOTAL KNEE ARTHROPLASTY;  Surgeon: Arlyne Lame, MD;  Location: [facility];  Service: Orthopedics;  Laterality: Right;   KYPHOPLASTY N/A 02/21/2017   Procedure: [id number];  Surgeon: Molli Angelucci, MD;  Location: [facility];  Service: Orthopedics;  Laterality: N/A;   laparotomy with biopsy  03/02/2007   PORTACATH PLACEMENT  2009   SPINE SURGERY  1997   SQUAMOUS CELL CARCINOMA EXCISION     right arm   TOTAL KNEE  ARTHROPLASTY Left    2009   URETEROSCOPY Left 01/30/2018   Procedure: URETEROSCOPY;  Surgeon: Lawerence Pressman, MD;  Location: [facility];  Service: Urology;  Laterality: Left;   VAGINAL HYSTERECTOMY  1971   Social History:  reports that she quit smoking about 34 years ago. Her smoking use included cigarettes. She has been exposed to tobacco smoke. She has never used smokeless tobacco. She reports that she does not currently use alcohol. She reports that she does not use drugs.  Allergies  Allergen Reactions   Ciprofloxacin  Itching    Whelps on face reported 03/16/2021   Latex Itching   Misc. Sulfonamide Containing Compounds    Sulfa Antibiotics Hives and Itching    Family History  Problem Relation Age of Onset   Breast cancer Sister    Dementia Sister    Cataracts Sister    Heart attack Brother    CAD Brother    Heart attack Brother    Leukemia Grandchild  granddaughter   Kidney disease Neg Hx    Bladder Cancer Neg Hx     Prior to Admission medications   Medication Sig Start Date End Date Taking? Authorizing Provider  traMADol  (ULTRAM ) 50 MG tablet Take 1 tablet (50 mg total) by mouth every 6 (six) hours as needed. 06/18/23 06/17/24 Yes Marylynn Soho, MD  acetaminophen  (TYLENOL ) 500 MG tablet Take 1,000 mg by mouth 2 (two) times daily.    [provider]  albuterol  (VENTOLIN  HFA) 108 (90 Base) MCG/ACT inhaler Inhale 2 puffs into the lungs every 6 (six) hours as needed for wheezing or shortness of breath. 10/04/22   Vergia Glasgow, MD  benzonatate  (TESSALON ) 100 MG capsule Take 1 capsule (100 mg total) by mouth 2 (two) times daily as needed for cough. 06/17/23   Luna Salinas, MD  Budeson-Glycopyrrol-Formoterol  (BREZTRI  AEROSPHERE) 160-9-4.8 MCG/ACT AERO Inhale 2 puffs into the lungs in the morning and at bedtime. 07/01/22   Vergia Glasgow, MD  cefadroxil (DURICEF) 500 MG capsule Take 1 capsule (500 mg total) by mouth 2 (two) times daily for 3 days. 06/17/23 06/20/23  Amin,  Sumayya, MD  cholecalciferol (VITAMIN D3) 25 MCG (1000 UNIT) tablet Take 1,000 Units by mouth daily.    [provider]  ferrous sulfate  325 (65 FE) MG tablet Take 325 mg by mouth daily with breakfast.    [provider]  guaiFENesin  (MUCINEX ) 600 MG 12 hr tablet Take 2 tablets (1,200 mg total) by mouth 2 (two) times daily as needed for to loosen phlegm or cough. 06/17/23   Amin, Sumayya, MD  ipratropium (ATROVENT ) 0.02 % nebulizer solution Take 0.5 mg by nebulization every 8 (eight) hours as needed for shortness of breath (cough).    [provider]  methenamine  (HIPREX ) 1 g tablet TAKE ONE (1) TABLET BY MOUTH TWO TIMES PER DAY 11/14/22   McGowan, Cathleen Coach A, [name]  predniSONE  (DELTASONE ) 20 MG tablet Take 2 tablets (40 mg total) by mouth daily with breakfast for 3 days. 06/18/23 06/21/23  Amin, Sumayya, MD  psyllium (REGULOID) 0.52 g capsule Take 0.52 g by mouth daily.    [provider]  Saccharomyces boulardii (FLORASTOR PO) Take 2 capsules by mouth daily.    [provider]  senna-docusate (SENOKOT-S) 8.6-50 MG tablet Take 1 tablet by mouth daily.    [provider]  Spacer/Aero-Holding Chambers (AEROCHAMBER MV) inhaler Use as instructed 10/04/22   Vergia Glasgow, MD    Physical Exam: Vitals:   06/18/23 2130 06/18/23 2330 06/19/23 0000 06/19/23 0105  BP: (!) 153/77 (!) 142/66 138/66 (!) 152/69  Pulse: 97 99 (!) 101 99  Resp: 16 (!) 21 (!) 24 16  Temp:    98.4 F (36.9 C)  TempSrc:    Oral  SpO2: 98% 98% 96% 98%  Weight:      Height:       Physical Exam Vitals and nursing note reviewed.  Constitutional:      General: She is not in acute distress.    Comments: Patient appears comfortable.  TLSO brace on  HENT:     Head: Normocephalic and atraumatic.  Cardiovascular:     Rate and Rhythm: Normal rate and regular rhythm.     Heart sounds: Normal heart sounds.  Pulmonary:     Effort: Pulmonary effort is normal.     Breath sounds:  Normal breath sounds.  Abdominal:     Palpations: Abdomen is soft.     Tenderness: There is no abdominal tenderness.  Neurological:  Mental Status: Mental status is at baseline.     Labs on Admission: I have personally reviewed following labs and imaging studies  CBC: Recent Labs  Lab 06/14/23 1243 06/16/23 0351 06/18/23 1852  WBC 11.3* 11.3* 13.6*  NEUTROABS 6.1  --  11.5*  HGB 11.9* 11.8* 13.3  HCT 37.6 36.6 41.8  MCV 90.4 88.0 90.5  PLT 335 369 371   Basic Metabolic Panel: Recent Labs  Lab 06/14/23 1243 06/15/23 0408 06/16/23 0351 06/17/23 0534 06/18/23 1852  NA 131* 133* 132* 130* 133*  K 4.0 4.8 3.5 3.4* 5.4*  CL 97* 97* 94* 95* 97*  CO2 24 25 28 26 23   GLUCOSE 99 171* 115* 105* 131*  BUN 22 22 28* 28* 31*  CREATININE 0.92 0.83 0.95 0.74 1.12*  CALCIUM  8.8* 9.3 8.9 8.6* 8.9  MG 1.9  --   --   --   --    GFR: Estimated Creatinine Clearance: 28.4 mL/min (A) (by C-G formula based on SCr of 1.12 mg/dL (H)). Liver Function Tests: Recent Labs  Lab 06/14/23 1243  AST 18  ALT 14  ALKPHOS 73  BILITOT 0.6  PROT 7.0  ALBUMIN 3.8   No results for input(s): "LIPASE", "AMYLASE" in the last 168 hours. No results for input(s): "AMMONIA" in the last 168 hours. Coagulation Profile: No results for input(s): "INR", "PROTIME" in the last 168 hours. Cardiac Enzymes: No results for input(s): "CKTOTAL", "CKMB", "CKMBINDEX", "TROPONINI" in the last 168 hours. BNP (last 3 results) No results for input(s): "PROBNP" in the last 8760 hours. HbA1C: No results for input(s): "HGBA1C" in the last 72 hours. CBG: No results for input(s): "GLUCAP" in the last 168 hours. Lipid Profile: No results for input(s): "CHOL", "HDL", "LDLCALC", "TRIG", "CHOLHDL", "LDLDIRECT" in the last 72 hours. Thyroid  Function Tests: No results for input(s): "TSH", "T4TOTAL", "FREET4", "T3FREE", "THYROIDAB" in the last 72 hours. Anemia Panel: No results for input(s): "VITAMINB12", "FOLATE",  "FERRITIN", "TIBC", "IRON", "RETICCTPCT" in the last 72 hours. Urine analysis:    Component Value Date/Time   COLORURINE YELLOW (A) 06/15/2023 0934   APPEARANCEUR CLOUDY (A) 06/15/2023 0934   APPEARANCEUR Cloudy (A) 05/08/2023 1332   LABSPEC 1.006 06/15/2023 0934   LABSPEC 1.010 01/09/2013 0647   PHURINE 6.0 06/15/2023 0934   GLUCOSEU NEGATIVE 06/15/2023 0934   GLUCOSEU Negative 01/09/2013 0647   HGBUR MODERATE (A) 06/15/2023 0934   BILIRUBINUR NEGATIVE 06/15/2023 0934   BILIRUBINUR Negative 05/08/2023 1332   BILIRUBINUR Negative 01/09/2013 0647   KETONESUR NEGATIVE 06/15/2023 0934   PROTEINUR NEGATIVE 06/15/2023 0934   UROBILINOGEN 0.2 04/09/2022 1452   NITRITE POSITIVE (A) 06/15/2023 0934   LEUKOCYTESUR LARGE (A) 06/15/2023 0934   LEUKOCYTESUR 2+ 01/09/2013 0647    Radiological Exams on Admission: CT Hip Left Wo Contrast Result Date: 06/18/2023 CLINICAL DATA:  Fall with hip pain EXAM: CT OF THE LEFT HIP WITHOUT CONTRAST TECHNIQUE: Multidetector CT imaging of the left hip was performed according to the standard protocol. Multiplanar CT image reconstructions were also generated. RADIATION DOSE REDUCTION: This exam was performed according to the departmental dose-optimization program which includes automated exposure control, adjustment of the mA and/or kV according to patient size and/or use of iterative reconstruction technique. COMPARISON:  06/18/2023 FINDINGS: Bones/Joint/Cartilage No definitive acute fracture or malalignment. No significant hip effusion. Moderate degenerative changes and chondrocalcinosis. Ligaments Suboptimally assessed by CT. Muscles and Tendons No intramuscular fluid collections.  No abnormal atrophy Soft tissues Large left adnexal cyst measuring 7.3 by 5.9 by 7.2 cm. Partially  visualized left ureteral stent IMPRESSION: 1. No CT evidence for acute osseous abnormality. 2. Moderate degenerative changes of the left hip. 3. Large left adnexal cyst measuring up to 7.3 cm.  This is stable compared with 2024 abdominopelvic CT, slightly enlarged compared to 2022 emanation. Nonemergent follow-up pelvic ultrasound may be performed as indicated given advanced age of patient Electronically Signed   By: Esmeralda Hedge M.D.   On: 06/18/2023 21:02   CT Lumbar Spine Wo Contrast Result Date: 06/18/2023 CLINICAL DATA:  Fall with back pain EXAM: CT LUMBAR SPINE WITHOUT CONTRAST TECHNIQUE: Multidetector CT imaging of the lumbar spine was performed without intravenous contrast administration. Multiplanar CT image reconstructions were also generated. RADIATION DOSE REDUCTION: This exam was performed according to the departmental dose-optimization program which includes automated exposure control, adjustment of the mA and/or kV according to patient size and/or use of iterative reconstruction technique. COMPARISON:  Lumbar CT 02/17/2017 FINDINGS: Segmentation: 5 lumbar type vertebrae. Alignment: Levoscoliosis. Vertebrae: Treated compression deformity at L1. Moderate age indeterminate superior endplate deformity at T10. Acute appearing compression fracture involving T11 vertebral body, involves the anterior cortex, superior and probably the inferior endplate. No lucency at the posterior arch. Age indeterminate moderate superior endplate fracture at L2. Paraspinal and other soft tissues: Mild paravertebral edema at T11. Aortic atherosclerosis. Incompletely visualized 5.3 cm left adnexal cyst Disc levels: Moderate severe disc space narrowing at L3-L4, L4-L5 and L5-S1 with vacuum discs. Moderate facet degeneration at L2-L3 with advanced bilateral facet disease at L3-L4, L4-L5 and L5-S1. Moderate severe spinal canal stenosis at L3-L4 and L4-L5 secondary to disc disease and posterior facet hypertrophy. Moderate severe right foraminal narrowing at L3-L4 and moderate foraminal narrowing on the left at L4-L5. Moderate left foraminal narrowing at L5-S1. IMPRESSION: 1. Acute appearing compression fracture  involving T11 vertebral body, involves the anterior cortex, superior and probably the inferior endplate. No fracture lucency at the posterior arch. 2. Age indeterminate moderate superior endplate fracture at T10. 3. Age indeterminate moderate superior endplate fracture at L2. 4. Treated compression deformity at L1. 5. Levoscoliosis with multilevel degenerative changes. Moderate severe spinal canal stenosis at L3-L4 and L4-L5. 6. Incompletely visualized 5.3 cm left adnexal cyst. Ultrasound follow-up may be performed if desired given advanced age of patient 7. Aortic atherosclerosis. Aortic Atherosclerosis (ICD10-I70.0). Electronically Signed   By: Esmeralda Hedge M.D.   On: 06/18/2023 20:56   DG Lumbar Spine 2-3 Views Result Date: 06/18/2023 CLINICAL DATA:  Fall EXAM: LUMBAR SPINE - 2-3 VIEW COMPARISON:  CT 11/16/2022 FINDINGS: Osteopenia. Left-sided ureteral stent.treated compression deformity at L1. New superior endplate deformity at L2 of uncertain age. Moderate disc space narrowing and degenerative change L3-L4, L4-L5 and L5-S1. Diffuse aortic atherosclerosis. IMPRESSION: 1. Osteopenia with treated compression deformity at L1. New superior endplate deformity at L2 of uncertain age. Correlate for point tenderness and consider correlation with CT. 2. Degenerative changes. Electronically Signed   By: Esmeralda Hedge M.D.   On: 06/18/2023 19:33   DG Chest 1 View Result Date: 06/18/2023 CLINICAL DATA:  Cough EXAM: CHEST  1 VIEW COMPARISON:  06/14/2023 FINDINGS: No acute airspace disease or effusion. Stable cardiomediastinal silhouette with aortic atherosclerosis. No pneumothorax IMPRESSION: No active disease. Electronically Signed   By: Esmeralda Hedge M.D.   On: 06/18/2023 19:31   DG Hip Unilat W or Wo Pelvis 2-3 Views Left Result Date: 06/18/2023 CLINICAL DATA:  Fall EXAM: DG HIP (WITH OR WITHOUT PELVIS) 2-3V LEFT COMPARISON:  CT 11/16/2022, radiograph 04/10/2018 for FINDINGS: Bones appear osteopenic. Partially  visualized left ureteral stent. SI joints are non widened. Pubic symphysis is non widened. Possible acute left superior pubic ramus fracture. Multiple linear lucencies overlying the left trochanter, suspect largely related to skin fold artifact. Vascular calcifications. IMPRESSION: 1. Possible acute left superior pubic ramus fracture. 2. Multiple linear lucencies overlying the left trochanter, suspect largely related to skin fold artifact, however given osteopenia, recommend correlation with CT Electronically Signed   By: Esmeralda Hedge M.D.   On: 06/18/2023 19:28   CT Head Wo Contrast Result Date: 06/18/2023 CLINICAL DATA:  Fall hit head on bookshelf vomiting, pain EXAM: CT HEAD WITHOUT CONTRAST CT CERVICAL SPINE WITHOUT CONTRAST TECHNIQUE: Multidetector CT imaging of the head and cervical spine was performed following the standard protocol without intravenous contrast. Multiplanar CT image reconstructions of the cervical spine were also generated. RADIATION DOSE REDUCTION: This exam was performed according to the departmental dose-optimization program which includes automated exposure control, adjustment of the mA and/or kV according to patient size and/or use of iterative reconstruction technique. COMPARISON:  11/16/2022 FINDINGS: CT HEAD FINDINGS Brain: No evidence of acute infarction, hemorrhage, hydrocephalus, extra-axial collection or mass lesion/mass effect. Periventricular and deep white matter hypodensity. Mild global cerebral volume loss. Vascular: No hyperdense vessel or unexpected calcification. Skull: Normal. Negative for fracture or focal lesion. Sinuses/Orbits: No acute finding. Other: Soft tissue contusion of the left scalp vertex (series 4, image 44). CT CERVICAL SPINE FINDINGS Alignment: Degenerative straightening and reversal of the normal cervical lordosis. Skull base and vertebrae: No acute fracture. No primary bone lesion or focal pathologic process. Soft tissues and spinal canal: No  prevertebral fluid or swelling. No visible canal hematoma. Disc levels: Status post anterior cervical discectomy and fusion of C4-C5 with moderate disc degenerative change of the remaining cervical levels. Upper chest: Negative. Other: None. IMPRESSION: 1. No acute intracranial pathology. Small-vessel white matter disease and global cerebral volume loss in keeping with advanced patient. 2. Soft tissue contusion of the left scalp vertex. 3. No fracture or static subluxation of the cervical spine. 4. Status post anterior cervical discectomy and fusion of C4-C5 with moderate disc degenerative change of the remaining cervical levels. Electronically Signed   By: Fredricka Jenny M.D.   On: 06/18/2023 19:19   CT Cervical Spine Wo Contrast Result Date: 06/18/2023 CLINICAL DATA:  Fall hit head on bookshelf vomiting, pain EXAM: CT HEAD WITHOUT CONTRAST CT CERVICAL SPINE WITHOUT CONTRAST TECHNIQUE: Multidetector CT imaging of the head and cervical spine was performed following the standard protocol without intravenous contrast. Multiplanar CT image reconstructions of the cervical spine were also generated. RADIATION DOSE REDUCTION: This exam was performed according to the departmental dose-optimization program which includes automated exposure control, adjustment of the mA and/or kV according to patient size and/or use of iterative reconstruction technique. COMPARISON:  11/16/2022 FINDINGS: CT HEAD FINDINGS Brain: No evidence of acute infarction, hemorrhage, hydrocephalus, extra-axial collection or mass lesion/mass effect. Periventricular and deep white matter hypodensity. Mild global cerebral volume loss. Vascular: No hyperdense vessel or unexpected calcification. Skull: Normal. Negative for fracture or focal lesion. Sinuses/Orbits: No acute finding. Other: Soft tissue contusion of the left scalp vertex (series 4, image 44). CT CERVICAL SPINE FINDINGS Alignment: Degenerative straightening and reversal of the normal cervical  lordosis. Skull base and vertebrae: No acute fracture. No primary bone lesion or focal pathologic process. Soft tissues and spinal canal: No prevertebral fluid or swelling. No visible canal hematoma. Disc levels: Status post anterior cervical discectomy and fusion of C4-C5 with moderate disc degenerative change  of the remaining cervical levels. Upper chest: Negative. Other: None. IMPRESSION: 1. No acute intracranial pathology. Small-vessel white matter disease and global cerebral volume loss in keeping with advanced patient. 2. Soft tissue contusion of the left scalp vertex. 3. No fracture or static subluxation of the cervical spine. 4. Status post anterior cervical discectomy and fusion of C4-C5 with moderate disc degenerative change of the remaining cervical levels. Electronically Signed   By: Fredricka Jenny M.D.   On: 06/18/2023 19:19   Data Reviewed for HPI: Relevant notes from primary care and specialist visits, past discharge summaries as available in EHR, including Care Everywhere. Prior diagnostic testing as pertinent to current admission diagnoses Updated medications and problem lists for reconciliation ED course, including vitals, labs, imaging, treatment and response to treatment Triage notes, nursing and pharmacy notes and ED provider's notes Notable results as noted above in HPI      Assessment and Plan: * L1 vertebral fracture (HCC) Ground-level fall while using rollator History of kyphoplasty 2019 Multimodal pain control--required fentanyl  in the ED Continue TLSO brace PT OT consult Daughter states that patient had kyphoplasty in 2019 and she is amenable to her having a procedure if needed Can consider neurosurgery consult  AKI (acute kidney injury) (HCC) Creatinine 1.12 up from baseline of 0.74 Received a 500 mL NS bolus in the ED Continue to monitor Monitor renal function and avoid nephrotoxins  E. coli UTI (urinary tract infection) Continue cefadroxil to completion on  5/23--recently treated inpatient for UTI from 5/17 to 06/17/2023  Diastolic CHF, chronic (HCC) Clinically euvolemic to dry Not on any noted GDMT medications, pending med reconciliation  COPD (chronic obstructive pulmonary disease) (HCC) Not acutely exacerbated, though had recent exacerbation Continue controller inhalers with DuoNebs as needed Will continue prednisone  from recent COPD exacerbation due to finish on 5/24  Adnexal mass Stable 7.3 cm left adnexal mass  Nephrolithiasis History of cystoscopy with ureteral stent placement on 05/30/2023 Acute issues not suspected  Advanced dementia (HCC) Delirium precautions  Lymphoma of small bowel (HCC) No acute issues suspected    DVT prophylaxis: Lovenox   Consults: none  Advance Care Planning:   Code Status: Prior   Family Communication: Daughter at bedside  Disposition Plan: Back to previous home environment  Severity of Illness: The appropriate patient status for this patient is OBSERVATION. Observation status is judged to be reasonable and necessary in order to provide the required intensity of service to ensure the patient's safety. The patient's presenting symptoms, physical exam findings, and initial radiographic and laboratory data in the context of their medical condition is felt to place them at decreased risk for further clinical deterioration. Furthermore, it is anticipated that the patient will be medically stable for discharge from the hospital within 2 midnights of admission.   Author: Lanetta Pion, MD 06/19/2023 1:17 AM  For on call review [email or web address].

## 2023-06-19 NOTE — Assessment & Plan Note (Addendum)
 Ground-level fall while using rollator History of kyphoplasty 2019 Patient still in a lot of pain this morning.  Need Tylenol  around-the-clock dosing.  As needed oxycodone .  Discontinued IV pain medication on 5/24. Continue TLSO brace with working with physical therapy. PT OT recommending rehab and this will need to be approved by insurance company.  Currently at long-term care patient. Miacalcin nasal spray. Interventional radiology performed kyphoplasty on 5/27.

## 2023-06-19 NOTE — Assessment & Plan Note (Addendum)
 5.3 cm left adnexal mass.  Present since 2020.

## 2023-06-19 NOTE — Assessment & Plan Note (Signed)
 Delirium precautions

## 2023-06-20 ENCOUNTER — Observation Stay

## 2023-06-20 DIAGNOSIS — I5032 Chronic diastolic (congestive) heart failure: Secondary | ICD-10-CM | POA: Diagnosis not present

## 2023-06-20 DIAGNOSIS — N179 Acute kidney failure, unspecified: Secondary | ICD-10-CM | POA: Diagnosis not present

## 2023-06-20 DIAGNOSIS — N390 Urinary tract infection, site not specified: Secondary | ICD-10-CM | POA: Diagnosis not present

## 2023-06-20 DIAGNOSIS — S22080S Wedge compression fracture of T11-T12 vertebra, sequela: Secondary | ICD-10-CM | POA: Diagnosis not present

## 2023-06-20 DIAGNOSIS — S22080D Wedge compression fracture of T11-T12 vertebra, subsequent encounter for fracture with routine healing: Secondary | ICD-10-CM | POA: Diagnosis not present

## 2023-06-20 LAB — BASIC METABOLIC PANEL WITH GFR
Anion gap: 8 (ref 5–15)
BUN: 28 mg/dL — ABNORMAL HIGH (ref 8–23)
CO2: 25 mmol/L (ref 22–32)
Calcium: 8.7 mg/dL — ABNORMAL LOW (ref 8.9–10.3)
Chloride: 98 mmol/L (ref 98–111)
Creatinine, Ser: 0.87 mg/dL (ref 0.44–1.00)
GFR, Estimated: >60 mL/min (ref >60)
Glucose, Bld: 119 mg/dL — ABNORMAL HIGH (ref 70–99)
Potassium: 4.5 mmol/L (ref 3.5–5.1)
Sodium: 131 mmol/L — ABNORMAL LOW (ref 135–145)

## 2023-06-20 MED ORDER — SODIUM CHLORIDE 0.9 % IV BOLUS
500.0000 mL | Freq: Once | INTRAVENOUS | Status: AC
  Administered 2023-06-20: 500 mL via INTRAVENOUS

## 2023-06-20 MED ORDER — ENOXAPARIN SODIUM 40 MG/0.4ML IJ SOSY
40.0000 mg | PREFILLED_SYRINGE | INTRAMUSCULAR | Status: DC
  Administered 2023-06-20 – 2023-06-23 (×4): 40 mg via SUBCUTANEOUS
  Filled 2023-06-20 (×4): qty 0.4

## 2023-06-20 MED ORDER — CEFAZOLIN SODIUM-DEXTROSE 2-4 GM/100ML-% IV SOLN
2.0000 g | INTRAVENOUS | Status: AC
  Administered 2023-06-23: 2 g via INTRAVENOUS
  Filled 2023-06-20: qty 100

## 2023-06-20 NOTE — Consult Note (Signed)
 Chief Complaint: Patient was seen in consultation today for T11 Compression Fracture   Procedure: T11 Vertebral Body Kyphoplasty  Referring Physician(s): Dr. Verla Glaze [facility])  Supervising Physician: Art Largo  Patient Status: [facility] - In-pt  History of Present Illness: Monica Singh is a 89 y.o. female recently discharged from the hospital on 5/20 for a COPD exacerbation and UTI, still on her antibiotics, who re-presented to the ED on 5/21 after falling backwards and hitting her head while using a new rollator. She presented with head, neck, and lower spine pain. CT Lumbar Spine revealed an acute appearing compression fracture of T11. Patient and family initially requesting conservative management with pain control and therapy; however, due to persistent high level of pain IR has been consulted for possible kyphoplasty. Images and case reviewed and approved by Dr. Enos Harts.   Patient is resting in bed with her daughters [name]  and [name] at the bedside. [name]  states that she previously had a kyphoplasty in 2019 with Dr. Mozell Arias in Orthopedic surgery. States that her mother tolerated this procedure very well. Patient denies any overt back pain currently, but daughters state she hasn't moved out of bed most of the day. [name] states she tried to sit her bed up 30 degrees earlier and her mother admitted to back pain. Otherwise, patient is without complaints. All questions and concerns answered at the bedside.   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   NON-HODGKINS-stage 3, cd 20 positive; status post 6 cycles of R-CHOP   DNR (do not  resuscitate)    Dyspnea    with exertion   Essential hypertension 04/10/2022   Herpes zoster without complication 01/31/2009   History of hiatal hernia    HOH (hard of hearing)    wears hearing aides   Hypothyroidism    Mitral regurgitation    Non Hodgkin's lymphoma (HCC)    Nursing home resident    Obstruction of left ureteropelvic junction (UPJ)    Personal history of chemotherapy    PONV (postoperative nausea and vomiting)    in January lasted about 4 hours   Recurrent UTI    Squamous cell carcinoma of skin 11/09/2008   Right post. lat. elbow. SCCis arising in [place]. Excised 01/04/2009, margins free.    Squamous cell carcinoma of skin 07/26/2020   right hand   Syncope     Past Surgical History:  Procedure Laterality Date   ABDOMINAL SURGERY  2009   abdominal mass+ NH lymphoma,   BACK SURGERY  01/2017   fusion. metal plate in neck at back   CATARACT EXTRACTION  1997   right eye and left ey   cervical neck fusion  1995   CYSTOSCOPY W/ RETROGRADES Left 01/30/2018   Procedure: CYSTOSCOPY WITH RETROGRADE PYELOGRAM;  Surgeon: Lawerence Pressman, MD;  Location: [facility] OR[facility];  Service: Urology;  Laterality: Left;   CYSTOSCOPY W/ RETROGRADES Left 08/05/2018   Procedure: CYSTOSCOPY WITH RETROGRADE PYELOGRAM;  Surgeon: Lawerence Pressman, MD;  Location: [facility] OR[facility];  Service: Urology;  Laterality: Left;   CYSTOSCOPY W/ RETROGRADES Left 06/01/2021   Procedure: CYSTOSCOPY WITH  RETROGRADE PYELOGRAM;  Surgeon: Lawerence Pressman, MD;  Location: [facility];  Service: Urology;  Laterality: Left;   CYSTOSCOPY W/ URETERAL STENT PLACEMENT Left 08/05/2018   Procedure: CYSTOSCOPY WITH STENT Exchange;  Surgeon: Lawerence Pressman, MD;  Location: [facility];  Service: Urology;  Laterality: Left;   CYSTOSCOPY W/ URETERAL STENT PLACEMENT Left 05/21/2019   Procedure: CYSTOSCOPY WITH STENT REPLACEMENT;  Surgeon: Lawerence Pressman, MD;  Location: [facility];  Service: Urology;  Laterality: Left;   CYSTOSCOPY W/ URETERAL STENT  PLACEMENT Left 05/05/2020   Procedure: CYSTOSCOPY WITH RETROGRADE PYELOGRAM/URETERAL STENT EXCHANGE;  Surgeon: Lawerence Pressman, MD;  Location: [facility];  Service: Urology;  Laterality: Left;   CYSTOSCOPY W/ URETERAL STENT PLACEMENT Left 06/01/2021   Procedure: CYSTOSCOPY WITH STENT EXCHANGE;  Surgeon: Lawerence Pressman, MD;  Location: [facility];  Service: Urology;  Laterality: Left;   CYSTOSCOPY W/ URETERAL STENT PLACEMENT Left 05/30/2023   Procedure: CYSTOSCOPY, FLEXIBLE, WITH STENT REPLACEMENT;  Surgeon: Lawerence Pressman, MD;  Location: [facility];  Service: Urology;  Laterality: Left;   CYSTOSCOPY WITH BIOPSY Left 02/27/2018   Procedure: CYSTOSCOPY WITH BIOPSY;  Surgeon: Lawerence Pressman, MD;  Location: [facility];  Service: Urology;  Laterality: Left;   CYSTOSCOPY WITH STENT PLACEMENT Left 01/30/2018   Procedure: CYSTOSCOPY WITH STENT PLACEMENT;  Surgeon: Lawerence Pressman, MD;  Location: [facility];  Service: Urology;  Laterality: Left;   CYSTOSCOPY WITH STENT PLACEMENT Left 07/12/2022   Procedure: CYSTOSCOPY WITH STENT EXCHANGE;  Surgeon: Lawerence Pressman, MD;  Location: [facility];  Service: Urology;  Laterality: Left;   CYSTOSCOPY WITH URETEROSCOPY AND STENT PLACEMENT Left 02/27/2018   Procedure: CYSTOSCOPY WITH URETEROSCOPY AND STENT Exchange;  Surgeon: Lawerence Pressman, MD;  Location: [facility];  Service: Urology;  Laterality: Left;   DENTAL SURGERY     screws   JOINT REPLACEMENT Left    knee   KNEE ARTHROPLASTY Right 12/23/2018   Procedure: RIGHT COMPUTER ASSISTED TOTAL KNEE ARTHROPLASTY;  Surgeon: Arlyne Lame, MD;  Location: [facility];  Service: Orthopedics;  Laterality: Right;   KYPHOPLASTY N/A 02/21/2017   Procedure: [id number];  Surgeon: Molli Angelucci, MD;  Location: [facility];  Service: Orthopedics;  Laterality: N/A;   laparotomy with biopsy  03/02/2007   PORTACATH PLACEMENT  2009   SPINE SURGERY  1997   SQUAMOUS CELL CARCINOMA EXCISION     right arm   TOTAL KNEE ARTHROPLASTY Left    2009    URETEROSCOPY Left 01/30/2018   Procedure: URETEROSCOPY;  Surgeon: Lawerence Pressman, MD;  Location: [facility];  Service: Urology;  Laterality: Left;   VAGINAL HYSTERECTOMY  1971    Allergies: Ciprofloxacin , Latex, Misc. sulfonamide containing compounds, and Sulfa antibiotics  Medications: Prior to Admission medications   Medication Sig Start Date End Date Taking? Authorizing Provider  acetaminophen  (TYLENOL ) 500 MG tablet Take 1,000 mg by mouth 2 (two) times daily.   Yes [provider]  benzonatate  (TESSALON ) 100 MG capsule Take 1 capsule (100 mg total) by mouth 2 (two) times daily as needed for cough. 06/17/23  Yes Luna Salinas, MD  Budeson-Glycopyrrol-Formoterol  (BREZTRI  AEROSPHERE) 160-9-4.8 MCG/ACT AERO Inhale 2 puffs into the lungs in the morning and at bedtime. 07/01/22  Yes Dgayli, Berneta Brightly, MD  cefadroxil (DURICEF) 500 MG capsule Take 1 capsule (500 mg total) by mouth 2 (two) times daily for 3 days. 06/17/23 06/20/23 Yes Amin, Sumayya, MD  cholecalciferol (VITAMIN D3) 25 MCG (1000 UNIT) tablet Take 1,000 Units by mouth daily.  Yes [provider]  ferrous sulfate  325 (65 FE) MG tablet Take 325 mg by mouth daily with breakfast.   Yes [provider]  guaiFENesin  (MUCINEX ) 600 MG 12 hr tablet Take 2 tablets (1,200 mg total) by mouth 2 (two) times daily as needed for to loosen phlegm or cough. 06/17/23  Yes Amin, Sumayya, MD  ipratropium (ATROVENT ) 0.02 % nebulizer solution Take 0.5 mg by nebulization every 8 (eight) hours as needed for shortness of breath (cough).   Yes [provider]  methenamine  (HIPREX ) 1 g tablet TAKE ONE (1) TABLET BY MOUTH TWO TIMES PER DAY 11/14/22  Yes McGowan, Shannon A, [name]  predniSONE  (DELTASONE ) 20 MG tablet Take 2 tablets (40 mg total) by mouth daily with breakfast for 3 days. 06/18/23 06/21/23 Yes Luna Salinas, MD  psyllium (REGULOID) 0.52 g capsule Take 0.52 g by mouth daily.   Yes [provider]  senna-docusate  (SENOKOT-S) 8.6-50 MG tablet Take 1 tablet by mouth daily.   Yes [provider]  Spacer/Aero-Holding Chambers (AEROCHAMBER MV) inhaler Use as instructed 10/04/22  Yes Dgayli, Berneta Brightly, MD  traMADol  (ULTRAM ) 50 MG tablet Take 1 tablet (50 mg total) by mouth every 6 (six) hours as needed. 06/18/23 06/17/24 Yes Marylynn Soho, MD  albuterol  (VENTOLIN  HFA) [phone] Base) MCG/ACT inhaler Inhale 2 puffs into the lungs every 6 (six) hours as needed for wheezing or shortness of breath. Patient not taking: Reported on 06/19/2023 10/04/22   Dgayli, Khabib, MD  Saccharomyces boulardii (FLORASTOR PO) Take 2 capsules by mouth daily. Patient not taking: Reported on 06/19/2023    [provider]     Family History  Problem Relation Age of Onset   Breast cancer Sister    Dementia Sister    Cataracts Sister    Heart attack Brother    CAD Brother    Heart attack Brother    Leukemia Grandchild        granddaughter   Kidney disease Neg Hx    Bladder Cancer Neg Hx     Social History   Socioeconomic History   Marital status: Widowed    Spouse name: Not on file   Number of children: 5   Years of education: Not on file   Highest education level: GED or equivalent  Occupational History   Occupation: retired    Comment: homemaker  Tobacco Use   Smoking status: Former    Current packs/day: 0.00    Types: Cigarettes    Quit date: 06/21/1989    Years since quitting: 34.0    Passive exposure: Past   Smokeless tobacco: Never  Vaping Use   Vaping status: Never Used  Substance and Sexual Activity   Alcohol use: Not Currently    Comment: not anymore   Drug use: No   Sexual activity: Not Currently    Birth control/protection: Post-menopausal  Other Topics Concern   Not on file  Social History Narrative   Ambulates independently, lives at home by herself   Social Drivers of Health   Financial Resource Strain: Low Risk  (08/19/2022)   Overall Financial Resource Strain (CARDIA)     Difficulty of Paying Living Expenses: Not hard at all  Food Insecurity: No Food Insecurity (06/19/2023)   Hunger Vital Sign    Worried About Running Out of Food in the Last Year: Never true    Ran Out of Food in the Last Year: Never true  Transportation Needs: No Transportation Needs (06/19/2023)   PRAPARE - Transportation  Lack of Transportation (Medical): No    Lack of Transportation (Non-Medical): No  Physical Activity: Insufficiently Active (08/19/2022)   Exercise Vital Sign    Days of Exercise per Week: 2 days    Minutes of Exercise per Session: 30 min  Stress: No Stress Concern Present (08/19/2022)   [employer] of Occupational Health - Occupational Stress Questionnaire    Feeling of Stress : Not at all  Social Connections: Socially Isolated (06/19/2023)   Social Connection and Isolation Panel [NHANES]    Frequency of Communication with Friends and Family: Never    Frequency of Social Gatherings with Friends and Family: More than three times a week    Attends Religious Services: Never    Database administrator or Organizations: No    Attends Banker Meetings: Never    Marital Status: Widowed    Review of Systems  Musculoskeletal:  Positive for back pain.  Denies any N/V, chest pain, shortness of breath, fevers/chills. All other ROS negative.  Vital Signs: BP (!) 169/91 (BP Location: Right Arm)   Pulse 100   Temp 99 F (37.2 C) (Oral)   Resp 17   Ht 5\' 2"  (1.575 m)   Wt 143 lb 4.8 oz (65 kg)   SpO2 95%   BMI 26.21 kg/m   Advance Care Plan: The advanced care plan/surrogate decision maker was discussed at the time of visit and documented in the medical record.    Physical Exam Vitals reviewed.  Constitutional:      Appearance: Normal appearance.  HENT:     Head: Normocephalic and atraumatic.     Mouth/Throat:     Mouth: Mucous membranes are moist.     Pharynx: Oropharynx is clear.  Cardiovascular:     Rate and Rhythm: Normal rate and regular  rhythm.     Heart sounds: Normal heart sounds.  Pulmonary:     Effort: Pulmonary effort is normal.     Breath sounds: Normal breath sounds.  Abdominal:     General: Abdomen is flat.     Palpations: Abdomen is soft.     Tenderness: There is abdominal tenderness (minimal suprapubic).  Musculoskeletal:        General: Normal range of motion.     Cervical back: Normal range of motion.     Thoracic back: Bony tenderness present.     Lumbar back: Bony tenderness present.  Skin:    General: Skin is warm and dry.  Neurological:     General: No focal deficit present.     Mental Status: She is alert and oriented to person, place, and time. Mental status is at baseline.  Psychiatric:        Mood and Affect: Mood normal.        Behavior: Behavior normal.        Judgment: Judgment normal.     Imaging: CT Hip Left Wo Contrast Result Date: 06/18/2023 CLINICAL DATA:  Fall with hip pain EXAM: CT OF THE LEFT HIP WITHOUT CONTRAST TECHNIQUE: Multidetector CT imaging of the left hip was performed according to the standard protocol. Multiplanar CT image reconstructions were also generated. RADIATION DOSE REDUCTION: This exam was performed according to the departmental dose-optimization program which includes automated exposure control, adjustment of the mA and/or kV according to patient size and/or use of iterative reconstruction technique. COMPARISON:  06/18/2023 FINDINGS: Bones/Joint/Cartilage No definitive acute fracture or malalignment. No significant hip effusion. Moderate degenerative changes and chondrocalcinosis. Ligaments Suboptimally assessed by CT. Muscles and  Tendons No intramuscular fluid collections.  No abnormal atrophy Soft tissues Large left adnexal cyst measuring 7.3 by 5.9 by 7.2 cm. Partially visualized left ureteral stent IMPRESSION: 1. No CT evidence for acute osseous abnormality. 2. Moderate degenerative changes of the left hip. 3. Large left adnexal cyst measuring up to 7.3 cm. This is  stable compared with 2024 abdominopelvic CT, slightly enlarged compared to 2022 emanation. Nonemergent follow-up pelvic ultrasound may be performed as indicated given advanced age of patient Electronically Signed   By: Esmeralda Hedge M.D.   On: 06/18/2023 21:02   CT Lumbar Spine Wo Contrast Result Date: 06/18/2023 CLINICAL DATA:  Fall with back pain EXAM: CT LUMBAR SPINE WITHOUT CONTRAST TECHNIQUE: Multidetector CT imaging of the lumbar spine was performed without intravenous contrast administration. Multiplanar CT image reconstructions were also generated. RADIATION DOSE REDUCTION: This exam was performed according to the departmental dose-optimization program which includes automated exposure control, adjustment of the mA and/or kV according to patient size and/or use of iterative reconstruction technique. COMPARISON:  Lumbar CT 02/17/2017 FINDINGS: Segmentation: 5 lumbar type vertebrae. Alignment: Levoscoliosis. Vertebrae: Treated compression deformity at L1. Moderate age indeterminate superior endplate deformity at T10. Acute appearing compression fracture involving T11 vertebral body, involves the anterior cortex, superior and probably the inferior endplate. No lucency at the posterior arch. Age indeterminate moderate superior endplate fracture at L2. Paraspinal and other soft tissues: Mild paravertebral edema at T11. Aortic atherosclerosis. Incompletely visualized 5.3 cm left adnexal cyst Disc levels: Moderate severe disc space narrowing at L3-L4, L4-L5 and L5-S1 with vacuum discs. Moderate facet degeneration at L2-L3 with advanced bilateral facet disease at L3-L4, L4-L5 and L5-S1. Moderate severe spinal canal stenosis at L3-L4 and L4-L5 secondary to disc disease and posterior facet hypertrophy. Moderate severe right foraminal narrowing at L3-L4 and moderate foraminal narrowing on the left at L4-L5. Moderate left foraminal narrowing at L5-S1. IMPRESSION: 1. Acute appearing compression fracture involving T11  vertebral body, involves the anterior cortex, superior and probably the inferior endplate. No fracture lucency at the posterior arch. 2. Age indeterminate moderate superior endplate fracture at T10. 3. Age indeterminate moderate superior endplate fracture at L2. 4. Treated compression deformity at L1. 5. Levoscoliosis with multilevel degenerative changes. Moderate severe spinal canal stenosis at L3-L4 and L4-L5. 6. Incompletely visualized 5.3 cm left adnexal cyst. Ultrasound follow-up may be performed if desired given advanced age of patient 7. Aortic atherosclerosis. Aortic Atherosclerosis (ICD10-I70.0). Electronically Signed   By: Esmeralda Hedge M.D.   On: 06/18/2023 20:56   DG Lumbar Spine 2-3 Views Result Date: 06/18/2023 CLINICAL DATA:  Fall EXAM: LUMBAR SPINE - 2-3 VIEW COMPARISON:  CT 11/16/2022 FINDINGS: Osteopenia. Left-sided ureteral stent.treated compression deformity at L1. New superior endplate deformity at L2 of uncertain age. Moderate disc space narrowing and degenerative change L3-L4, L4-L5 and L5-S1. Diffuse aortic atherosclerosis. IMPRESSION: 1. Osteopenia with treated compression deformity at L1. New superior endplate deformity at L2 of uncertain age. Correlate for point tenderness and consider correlation with CT. 2. Degenerative changes. Electronically Signed   By: Esmeralda Hedge M.D.   On: 06/18/2023 19:33   DG Chest 1 View Result Date: 06/18/2023 CLINICAL DATA:  Cough EXAM: CHEST  1 VIEW COMPARISON:  06/14/2023 FINDINGS: No acute airspace disease or effusion. Stable cardiomediastinal silhouette with aortic atherosclerosis. No pneumothorax IMPRESSION: No active disease. Electronically Signed   By: Esmeralda Hedge M.D.   On: 06/18/2023 19:31   DG Hip Unilat W or Wo Pelvis 2-3 Views Left Result Date: 06/18/2023 CLINICAL DATA:  Fall EXAM: DG HIP (WITH OR WITHOUT PELVIS) 2-3V LEFT COMPARISON:  CT 11/16/2022, radiograph 04/10/2018 for FINDINGS: Bones appear osteopenic. Partially visualized left  ureteral stent. SI joints are non widened. Pubic symphysis is non widened. Possible acute left superior pubic ramus fracture. Multiple linear lucencies overlying the left trochanter, suspect largely related to skin fold artifact. Vascular calcifications. IMPRESSION: 1. Possible acute left superior pubic ramus fracture. 2. Multiple linear lucencies overlying the left trochanter, suspect largely related to skin fold artifact, however given osteopenia, recommend correlation with CT Electronically Signed   By: Esmeralda Hedge M.D.   On: 06/18/2023 19:28   CT Head Wo Contrast Result Date: 06/18/2023 CLINICAL DATA:  Fall hit head on bookshelf vomiting, pain EXAM: CT HEAD WITHOUT CONTRAST CT CERVICAL SPINE WITHOUT CONTRAST TECHNIQUE: Multidetector CT imaging of the head and cervical spine was performed following the standard protocol without intravenous contrast. Multiplanar CT image reconstructions of the cervical spine were also generated. RADIATION DOSE REDUCTION: This exam was performed according to the departmental dose-optimization program which includes automated exposure control, adjustment of the mA and/or kV according to patient size and/or use of iterative reconstruction technique. COMPARISON:  11/16/2022 FINDINGS: CT HEAD FINDINGS Brain: No evidence of acute infarction, hemorrhage, hydrocephalus, extra-axial collection or mass lesion/mass effect. Periventricular and deep white matter hypodensity. Mild global cerebral volume loss. Vascular: No hyperdense vessel or unexpected calcification. Skull: Normal. Negative for fracture or focal lesion. Sinuses/Orbits: No acute finding. Other: Soft tissue contusion of the left scalp vertex (series 4, image 44). CT CERVICAL SPINE FINDINGS Alignment: Degenerative straightening and reversal of the normal cervical lordosis. Skull base and vertebrae: No acute fracture. No primary bone lesion or focal pathologic process. Soft tissues and spinal canal: No prevertebral fluid or  swelling. No visible canal hematoma. Disc levels: Status post anterior cervical discectomy and fusion of C4-C5 with moderate disc degenerative change of the remaining cervical levels. Upper chest: Negative. Other: None. IMPRESSION: 1. No acute intracranial pathology. Small-vessel white matter disease and global cerebral volume loss in keeping with advanced patient. 2. Soft tissue contusion of the left scalp vertex. 3. No fracture or static subluxation of the cervical spine. 4. Status post anterior cervical discectomy and fusion of C4-C5 with moderate disc degenerative change of the remaining cervical levels. Electronically Signed   By: Fredricka Jenny M.D.   On: 06/18/2023 19:19   CT Cervical Spine Wo Contrast Result Date: 06/18/2023 CLINICAL DATA:  Fall hit head on bookshelf vomiting, pain EXAM: CT HEAD WITHOUT CONTRAST CT CERVICAL SPINE WITHOUT CONTRAST TECHNIQUE: Multidetector CT imaging of the head and cervical spine was performed following the standard protocol without intravenous contrast. Multiplanar CT image reconstructions of the cervical spine were also generated. RADIATION DOSE REDUCTION: This exam was performed according to the departmental dose-optimization program which includes automated exposure control, adjustment of the mA and/or kV according to patient size and/or use of iterative reconstruction technique. COMPARISON:  11/16/2022 FINDINGS: CT HEAD FINDINGS Brain: No evidence of acute infarction, hemorrhage, hydrocephalus, extra-axial collection or mass lesion/mass effect. Periventricular and deep white matter hypodensity. Mild global cerebral volume loss. Vascular: No hyperdense vessel or unexpected calcification. Skull: Normal. Negative for fracture or focal lesion. Sinuses/Orbits: No acute finding. Other: Soft tissue contusion of the left scalp vertex (series 4, image 44). CT CERVICAL SPINE FINDINGS Alignment: Degenerative straightening and reversal of the normal cervical lordosis. Skull base  and vertebrae: No acute fracture. No primary bone lesion or focal pathologic process. Soft tissues and spinal canal: No prevertebral  fluid or swelling. No visible canal hematoma. Disc levels: Status post anterior cervical discectomy and fusion of C4-C5 with moderate disc degenerative change of the remaining cervical levels. Upper chest: Negative. Other: None. IMPRESSION: 1. No acute intracranial pathology. Small-vessel white matter disease and global cerebral volume loss in keeping with advanced patient. 2. Soft tissue contusion of the left scalp vertex. 3. No fracture or static subluxation of the cervical spine. 4. Status post anterior cervical discectomy and fusion of C4-C5 with moderate disc degenerative change of the remaining cervical levels. Electronically Signed   By: Fredricka Jenny M.D.   On: 06/18/2023 19:19   ECHOCARDIOGRAM COMPLETE Result Date: 06/17/2023    ECHOCARDIOGRAM REPORT   Patient Name:   Rachelle  [facility] Date of Exam: 06/15/2023 Medical Rec #:  [id number]           Height:       62.0 in Accession #:    [id number]          Weight:       144.0 lb Date of Birth:  06-Mar-1928           BSA:          1.663 m Patient Age:    89 years            BP:           136/60 mmHg Patient Gender: F                   HR:           100 bpm. Exam Location:  [facility] Procedure: 2D Echo, Color Doppler and Cardiac Doppler (Both Spectral and Color            Flow Doppler were utilized during procedure). Indications:     I50.31 Acute Diastolic CHF  History:         Patient has prior history of Echocardiogram examinations. COPD;                  Risk Factors:Hypertension.  Sonographer:     L. Thornton-Maynard Referring Phys:  [id number] [place] Diagnosing Phys: Antonette Batters MD  Sonographer Comments: Suboptimal apical window. IMPRESSIONS  1. Left ventricular ejection fraction, by estimation, is 60 to 65%. The left ventricle has normal function. The left ventricle has no regional wall motion abnormalities. Left  ventricular diastolic parameters are consistent with Grade I diastolic dysfunction (impaired relaxation).  2. Right ventricular systolic function is normal. The right ventricular size is normal. There is normal pulmonary artery systolic pressure.  3. The mitral valve is degenerative. Mild mitral valve regurgitation. Moderate to severe mitral annular calcification.  4. The aortic valve is normal in structure. Aortic valve regurgitation is not visualized. Aortic valve sclerosis is present, with no evidence of aortic valve stenosis. FINDINGS  Left Ventricle: Left ventricular ejection fraction, by estimation, is 60 to 65%. The left ventricle has normal function. The left ventricle has no regional wall motion abnormalities. Strain was performed and the global longitudinal strain is indeterminate. Global longitudinal strain performed but not reported based on interpreter judgement due to suboptimal tracking. The left ventricular internal cavity size was normal in size. There is no left ventricular hypertrophy. Left ventricular diastolic parameters are consistent with Grade I diastolic dysfunction (impaired relaxation). Right Ventricle: The right ventricular size is normal. No increase in right ventricular wall thickness. Right ventricular systolic function is normal. There is normal pulmonary artery systolic pressure. The tricuspid regurgitant velocity is  2.71 m/s, and  with an assumed right atrial pressure of 3 mmHg, the estimated right ventricular systolic pressure is 32.4 mmHg. Left Atrium: Left atrial size was normal in size. Right Atrium: Right atrial size was normal in size. Pericardium: There is no evidence of pericardial effusion. Mitral Valve: The mitral valve is degenerative in appearance. Moderate to severe mitral annular calcification. Mild mitral valve regurgitation. MV peak gradient, 15.4 mmHg. The mean mitral valve gradient is 7.5 mmHg. Tricuspid Valve: The tricuspid valve is normal in structure. Tricuspid  valve regurgitation is trivial. Aortic Valve: The aortic valve is normal in structure. Aortic valve regurgitation is not visualized. Aortic valve sclerosis is present, with no evidence of aortic valve stenosis. Aortic valve mean gradient measures 2.0 mmHg. Aortic valve peak gradient measures 3.5 mmHg. Aortic valve area, by VTI measures 2.38 cm. Pulmonic Valve: The pulmonic valve was normal in structure. Pulmonic valve regurgitation is not visualized. Aorta: The ascending aorta was not well visualized. IAS/Shunts: No atrial level shunt detected by color flow Doppler. Additional Comments: 3D was performed not requiring image post processing on an independent workstation and was indeterminate.  LEFT VENTRICLE PLAX 2D LVIDd:         3.70 cm     Diastology LVIDs:         2.20 cm     LV e' medial:    3.59 cm/s LV PW:         1.00 cm     LV E/e' medial:  29.0 LV IVS:        1.10 cm     LV e' lateral:   3.59 cm/s LVOT diam:     2.00 cm     LV E/e' lateral: 29.0 LV SV:         39 LV SV Index:   24 LVOT Area:     3.14 cm  LV Volumes (MOD) LV vol d, MOD A2C: 22.0 ml LV vol d, MOD A4C: 40.4 ml LV vol s, MOD A2C: 8.4 ml LV vol s, MOD A4C: 17.1 ml LV SV MOD A2C:     13.6 ml LV SV MOD A4C:     40.4 ml LV SV MOD BP:      18.9 ml RIGHT VENTRICLE             IVC RV Basal diam:  3.90 cm     IVC diam: 0.90 cm RV S prime:     21.80 cm/s TAPSE (M-mode): 2.2 cm LEFT ATRIUM             Index        RIGHT ATRIUM           Index LA diam:        3.90 cm 2.35 cm/m   RA Area:     14.20 cm LA Vol (A2C):   33.0 ml 19.85 ml/m  RA Volume:   33.30 ml  20.03 ml/m LA Vol (A4C):   45.6 ml 27.43 ml/m LA Biplane Vol: 39.1 ml 23.52 ml/m  AORTIC VALVE                    PULMONIC VALVE AV Area (Vmax):    2.40 cm     PV Vmax:       0.89 m/s AV Area (Vmean):   2.28 cm     PV Peak grad:  3.2 mmHg AV Area (VTI):     2.38 cm AV Vmax:  93.70 cm/s AV Vmean:          69.300 cm/s AV VTI:            0.165 m AV Peak Grad:      3.5 mmHg AV Mean  Grad:      2.0 mmHg LVOT Vmax:         71.70 cm/s LVOT Vmean:        50.300 cm/s LVOT VTI:          0.125 m LVOT/AV VTI ratio: 0.76  AORTA Ao Root diam: 3.40 cm Ao Asc diam:  3.10 cm MITRAL VALVE                TRICUSPID VALVE MV Area (PHT): 7.16 cm     TR Peak grad:   29.4 mmHg MV Area VTI:   1.36 cm     TR Vmax:        271.00 cm/s MV Peak grad:  15.4 mmHg MV Mean grad:  7.5 mmHg     SHUNTS MV Vmax:       1.96 m/s     Systemic VTI:  0.12 m MV Vmean:      129.5 cm/s   Systemic Diam: 2.00 cm MV Decel Time: 106 msec MV E velocity: 104.00 cm/s MV A velocity: 198.00 cm/s MV E/A ratio:  0.53 Dwayne D Callwood MD Electronically signed by Antonette Batters MD Signature Date/Time: 06/17/2023/1:35:44 PM    Final    DG Chest 2 View Result Date: 06/14/2023 CLINICAL DATA:  Productive cough for 1 week. EXAM: CHEST - 2 VIEW COMPARISON:  January 19, 2023. FINDINGS: Stable cardiomediastinal silhouette. No acute pulmonary disease is noted. Bony thorax is unremarkable. IMPRESSION: No active cardiopulmonary disease. Electronically Signed   By: Rosalene Colon M.D.   On: 06/14/2023 12:22   DG OR UROLOGY CYSTO IMAGE ([facility] ONLY) Result Date: 05/30/2023 There is no interpretation for this exam.  This order is for images obtained during a surgical procedure.  Please See "Surgeries" Tab for more information regarding the procedure.    Labs:  CBC: Recent Labs    06/14/23 1243 06/16/23 0351 06/18/23 1852 06/19/23 0518  WBC 11.3* 11.3* 13.6* 10.5  HGB 11.9* 11.8* 13.3 11.9*  HCT 37.6 36.6 41.8 37.4  PLT 335 369 371 333    COAGS: No results for input(s): "INR", "APTT" in the last 8760 hours.  BMP: Recent Labs    06/17/23 0534 06/18/23 1852 06/19/23 0518 06/20/23 0139  NA 130* 133* 133* 131*  K 3.4* 5.4* 4.3 4.5  CL 95* 97* 100 98  CO2 26 23 26 25   GLUCOSE 105* 131* 116* 119*  BUN 28* 31* 29* 28*  CALCIUM  8.6* 8.9 8.4* 8.7*  CREATININE 0.74 1.12* 0.91 0.87  GFRNONAA >60 46* 59* >60    LIVER FUNCTION  TESTS: Recent Labs    11/16/22 1424 06/14/23 1243  BILITOT 0.7 0.6  AST 14* 18  ALT 10 14  ALKPHOS 56 73  PROT 7.0 7.0  ALBUMIN 3.5 3.8    TUMOR MARKERS: No results for input(s): "AFPTM", "CEA", "CA199", "CHROMGRNA" in the last 8760 hours.  Assessment and Plan:  Fall with T11 compression fracture: Monica Singh is a 89 y.o. female with a history of COPD, UTI still on antibiotics, T11 compression fracture secondary to mechanical who presents to [facility] Interventional Radiology department for an image-guided kyphoplasty of T11. Procedure to be performed under moderate sedation.  -NPO at MN 5/27 -ppx lovenox  held  the morning of procedure -morning labs ordered -pre-procedure Ancef  ordered -Per power of attorney, [name] , DNR-Limited is to be left in place for the procedure -Plan for T11 Kyphoplasty on 5/27  Risks and benefits of T11 kyphoplasty were discussed with the patient including, but not limited to education regarding the natural healing process of compression fractures without intervention, bleeding, infection, cement migration which may cause spinal cord damage, paralysis, pulmonary embolism or even death.  This interventional procedure involves the use of X-rays and because of the nature of the planned procedure, it is possible that we will have prolonged use of X-ray fluoroscopy.  Potential radiation risks to you include (but are not limited to) the following: - A slightly elevated risk for cancer  several years later in life. This risk is typically less than 0.5% percent. This risk is low in comparison to the normal incidence of human cancer, which is 33% for women and 50% for men according to the [employer] Society. - Radiation induced injury can include skin redness, resembling a rash, tissue breakdown / ulcers and hair loss (which can be temporary or permanent).   The likelihood of either of these occurring depends on the difficulty of the procedure and  whether you are sensitive to radiation due to previous procedures, disease, or genetic conditions.   IF your procedure requires a prolonged use of radiation, you will be notified and given written instructions for further action.  It is your responsibility to monitor the irradiated area for the 2 weeks following the procedure and to notify your physician if you are concerned that you have suffered a radiation induced injury.    All of the patient's questions were answered, patient is agreeable to proceed.  Consent signed and in chart.   Thank you for this interesting consult. I greatly enjoyed meeting Monica Singh and look forward to participating in their care. A copy of this report was sent to the requesting provider on this date.  Electronically Signed: Orson Blalock, [name] 06/20/2023, 2:06 PM   I spent a total of 40 Minutes in face to face clinical consultation, greater than 50% of which was counseling/coordinating care for T11 vertebral body kyphoplasty.

## 2023-06-20 NOTE — Plan of Care (Signed)

## 2023-06-20 NOTE — Progress Notes (Signed)
 PHARMACIST - PHYSICIAN COMMUNICATION  CONCERNING:  Enoxaparin  (Lovenox ) for DVT Prophylaxis   ASSESSMENT: Patient was prescribed enoxaparin  40 mg subcutaneously every 24 hours for VTE prophylaxis.   Body mass index is 26.21 kg/m.  Estimated Creatinine Clearance: 36.5 mL/min (by C-G formula based on SCr of 0.87 mg/dL).  Based on [facility] policy, patient qualifies for enoxaparin  dosing of 40 mg every 24 hours because their creatinine clearance is >30 mL/min.  PLAN: Pharmacy has adjusted enoxaparin  dose per [facility] policy.  Description: Patient is now receiving enoxaparin  40 mg subcutaneously every 24 hours.  Will M. Alva Jewels, PharmD Clinical Pharmacist 06/20/2023 7:17 AM

## 2023-06-20 NOTE — Progress Notes (Signed)
 HR 108. MD notified at this time.

## 2023-06-20 NOTE — Progress Notes (Signed)
 Progress Note   Patient: Monica Singh ZOX:[id number] DOB: 1928/03/13 DOA: 06/18/2023     0 DOS: the patient was seen and examined on 06/20/2023   Brief hospital course: 89 y.o. female with medical history significant for COPD , diastolic CHF, CKD stage IIIa, dementia, non-Hodgkin's lymphoma in remission, HTN, hypothyroidism, left UPJ obstruction status post stent on 5/2, and discharged 2 days ago from hospitalization from 5/17 to 06/17/2023 for COPD/CHF exacerbation and E. coli UTI, still on antibiotics who presents from her assisted memory care unit following a fall while attempting to ambulate with her new rollator.  She fell backwards, hitting her head and had new onset of back pain.  Daughter at bedside contributes to history. ED course and data review: Tachycardic to about 103 with otherwise unremarkable vitals Labs notable for mild leukocytosis Of 13,600 slightly up from 11.3 on 5/20 Creatinine 1.12 up from 0.74 Potassium 5.4 Troponin 7 EKG, personally viewed and interpreted showing sinus at 99 with nonspecific T wave changes Trauma imaging included CT head, C-spine, L-spine and hip, as well as chest x-ray-which was significant for a new superior L1 endplate  fracture and a stable left adnexal cyst of 7.3 cm   Patient was treated with fentanyl  for pain Given a 500 cc NS bolus Fitted with a TLSO brace, but was unable to ambulate with the brace so admission requested   5/22.  Family would like to do conservative management with pain control physical therapy and Occupational Therapy evaluations.  Assessment and Plan: * Compression fracture of T11 vertebra (HCC) Ground-level fall while using rollator History of kyphoplasty 2019 Patient in a lot of pain this morning but wanted to start with Tylenol .  As needed pain medications IV and oral also ordered if this does not help. Continue TLSO brace with working with physical therapy. PT OT consults appreciated Miacalcin nasal  spray. Case discussed with interventional radiology and wanted to obtain MRI of the thoracic and lumbar spine.  They will start insurance authorization for kyphoplasty.  AKI (acute kidney injury) (HCC) Creatinine 1.12 up from baseline of 0.74 Received a 500 mL NS bolus in the ED Repeat creatinine down to 0.87.  E-coli UTI Continue cefadroxil to completion on 5/23--recently treated inpatient for UTI from 5/17 to 06/17/2023  Diastolic CHF, chronic (HCC) Clinically euvolemic to dry.  EF 60%   Hyponatremia Sodium 131 today  COPD (chronic obstructive pulmonary disease) (HCC) Not acutely exacerbated, though had recent exacerbation.  Patient has upper airway congestion. Continue controller inhalers with DuoNebs as needed Will continue prednisone  from recent COPD exacerbation due to finish on 5/24.  Adnexal mass 5.3 cm left adnexal mass.  Present since 2020.  Overweight (BMI 25.0-29.9) BMI 26.21  Nephrolithiasis History of cystoscopy with ureteral stent placement on 05/30/2023   Advanced dementia [facility]) Delirium precautions  Lymphoma of small bowel (HCC) No acute issues suspected        Subjective: Patient seen this morning and stated she feels miserable.  Having a lot of back pain.  In speaking with nursing staff she really has not taken too much medication.  Patient wanted to start with Tylenol  this morning and has as needed medications if that does not help.  Physical Exam: Vitals:   06/19/23 1945 06/20/23 0343 06/20/23 0751 06/20/23 0838  BP:  127/66 (!) 169/91   Pulse:  96 100   Resp:  20 17   Temp:  99.1 F (37.3 C) 99 F (37.2 C)   TempSrc:   Oral  SpO2: 95% 97% 94% 95%  Weight:      Height:       Physical Exam HENT:     Head: Normocephalic.  Eyes:     General: Lids are normal.     Conjunctiva/sclera: Conjunctivae normal.  Cardiovascular:     Rate and Rhythm: Normal rate and regular rhythm.     Heart sounds: Normal heart sounds, S1 normal and S2 normal.   Pulmonary:     Breath sounds: Transmitted upper airway sounds present. Examination of the right-lower field reveals decreased breath sounds. Examination of the left-lower field reveals decreased breath sounds. Decreased breath sounds present. No wheezing or rhonchi.     Comments: Upper airway congestion Abdominal:     Palpations: Abdomen is soft.     Tenderness: There is no abdominal tenderness.  Musculoskeletal:     Right lower leg: No swelling.     Left lower leg: No swelling.  Skin:    General: Skin is warm.     Findings: No rash.  Neurological:     Mental Status: She is alert.     Comments: Barely able to straight leg raise bilaterally.     Data Reviewed: Sodium 131, creatinine 0.87, hemoglobin 11.9  Family Communication: Spoke with family at the bedside  Disposition: Status is: Observation Patient in a lot of pain this morning.  Patient wanted to start with Tylenol  if that did not help we will go with the pain medication.  Family interested in pursuing kyphoplasty if a candidate.  MRI of the thoracic and lumbar spine ordered.  Case discussed with interventional radiology.  This procedure would also have to be approved through [employer].  Planned Discharge Destination: Rehab    Time spent: 28 minutes  Author: Verla Glaze, MD 06/20/2023 10:42 AM  For on call review [email or web address].

## 2023-06-20 NOTE — Progress Notes (Signed)
 PT Cancellation Note  Patient Details Name: Monica Singh MRN: [id number] DOB: 09-28-1928   Cancelled Treatment:     PT attempted 3 x this date. Several attempts, pt sleeping soundly or eating lunch. Currently, getting breathing treatment. Acute PT will continue to follow and progress per current POC advancing as able per pt tolerance.    Koleen Perna 06/20/2023, 3:48 PM

## 2023-06-20 NOTE — Care Management Obs Status (Signed)
 MEDICARE OBSERVATION STATUS NOTIFICATION   Patient Details  Name: Monica Singh MRN: [id number] Date of Birth: 09-09-1928   Medicare Observation Status Notification Given:  Yes    Janace Decker W, CMA 06/20/2023, 11:07 AM

## 2023-06-21 ENCOUNTER — Inpatient Hospital Stay

## 2023-06-21 DIAGNOSIS — B379 Candidiasis, unspecified: Secondary | ICD-10-CM | POA: Diagnosis present

## 2023-06-21 DIAGNOSIS — J449 Chronic obstructive pulmonary disease, unspecified: Secondary | ICD-10-CM | POA: Diagnosis present

## 2023-06-21 DIAGNOSIS — I5032 Chronic diastolic (congestive) heart failure: Secondary | ICD-10-CM | POA: Diagnosis present

## 2023-06-21 DIAGNOSIS — F0390 Unspecified dementia without behavioral disturbance: Secondary | ICD-10-CM | POA: Diagnosis present

## 2023-06-21 DIAGNOSIS — S22089A Unspecified fracture of T11-T12 vertebra, initial encounter for closed fracture: Secondary | ICD-10-CM | POA: Diagnosis present

## 2023-06-21 DIAGNOSIS — Z87891 Personal history of nicotine dependence: Secondary | ICD-10-CM | POA: Diagnosis not present

## 2023-06-21 DIAGNOSIS — E871 Hypo-osmolality and hyponatremia: Secondary | ICD-10-CM | POA: Diagnosis present

## 2023-06-21 DIAGNOSIS — N1831 Chronic kidney disease, stage 3a: Secondary | ICD-10-CM | POA: Diagnosis present

## 2023-06-21 DIAGNOSIS — Z66 Do not resuscitate: Secondary | ICD-10-CM | POA: Diagnosis present

## 2023-06-21 DIAGNOSIS — D72829 Elevated white blood cell count, unspecified: Secondary | ICD-10-CM | POA: Diagnosis present

## 2023-06-21 DIAGNOSIS — I130 Hypertensive heart and chronic kidney disease with heart failure and stage 1 through stage 4 chronic kidney disease, or unspecified chronic kidney disease: Secondary | ICD-10-CM | POA: Diagnosis present

## 2023-06-21 DIAGNOSIS — S22080S Wedge compression fracture of T11-T12 vertebra, sequela: Secondary | ICD-10-CM

## 2023-06-21 DIAGNOSIS — Z96653 Presence of artificial knee joint, bilateral: Secondary | ICD-10-CM | POA: Diagnosis present

## 2023-06-21 DIAGNOSIS — Z85828 Personal history of other malignant neoplasm of skin: Secondary | ICD-10-CM | POA: Diagnosis not present

## 2023-06-21 DIAGNOSIS — E663 Overweight: Secondary | ICD-10-CM | POA: Diagnosis present

## 2023-06-21 DIAGNOSIS — Z8249 Family history of ischemic heart disease and other diseases of the circulatory system: Secondary | ICD-10-CM | POA: Diagnosis not present

## 2023-06-21 DIAGNOSIS — G9341 Metabolic encephalopathy: Secondary | ICD-10-CM

## 2023-06-21 DIAGNOSIS — E039 Hypothyroidism, unspecified: Secondary | ICD-10-CM | POA: Diagnosis present

## 2023-06-21 DIAGNOSIS — K5900 Constipation, unspecified: Secondary | ICD-10-CM | POA: Diagnosis present

## 2023-06-21 DIAGNOSIS — Y9301 Activity, walking, marching and hiking: Secondary | ICD-10-CM | POA: Diagnosis present

## 2023-06-21 DIAGNOSIS — B9620 Unspecified Escherichia coli [E. coli] as the cause of diseases classified elsewhere: Secondary | ICD-10-CM | POA: Diagnosis present

## 2023-06-21 DIAGNOSIS — N179 Acute kidney failure, unspecified: Secondary | ICD-10-CM | POA: Diagnosis present

## 2023-06-21 DIAGNOSIS — S22080D Wedge compression fracture of T11-T12 vertebra, subsequent encounter for fracture with routine healing: Secondary | ICD-10-CM | POA: Diagnosis not present

## 2023-06-21 DIAGNOSIS — N390 Urinary tract infection, site not specified: Secondary | ICD-10-CM | POA: Diagnosis present

## 2023-06-21 DIAGNOSIS — S22080A Wedge compression fracture of T11-T12 vertebra, initial encounter for closed fracture: Secondary | ICD-10-CM | POA: Diagnosis not present

## 2023-06-21 DIAGNOSIS — Y92099 Unspecified place in other non-institutional residence as the place of occurrence of the external cause: Secondary | ICD-10-CM | POA: Diagnosis not present

## 2023-06-21 DIAGNOSIS — Z9221 Personal history of antineoplastic chemotherapy: Secondary | ICD-10-CM | POA: Diagnosis not present

## 2023-06-21 DIAGNOSIS — W01198A Fall on same level from slipping, tripping and stumbling with subsequent striking against other object, initial encounter: Secondary | ICD-10-CM | POA: Diagnosis present

## 2023-06-21 DIAGNOSIS — C859A Non-Hodgkin lymphoma, unspecified, in remission: Secondary | ICD-10-CM | POA: Diagnosis present

## 2023-06-21 DIAGNOSIS — S32019A Unspecified fracture of first lumbar vertebra, initial encounter for closed fracture: Secondary | ICD-10-CM | POA: Diagnosis present

## 2023-06-21 MED ORDER — ACETAMINOPHEN 650 MG RE SUPP
650.0000 mg | Freq: Four times a day (QID) | RECTAL | Status: DC
Start: 2023-06-21 — End: 2023-06-26

## 2023-06-21 MED ORDER — PREDNISONE 20 MG PO TABS
20.0000 mg | ORAL_TABLET | Freq: Every day | ORAL | Status: DC
  Administered 2023-06-22 – 2023-06-23 (×2): 20 mg via ORAL
  Filled 2023-06-21 (×2): qty 1

## 2023-06-21 MED ORDER — ALBUTEROL SULFATE (2.5 MG/3ML) 0.083% IN NEBU
2.5000 mg | INHALATION_SOLUTION | Freq: Three times a day (TID) | RESPIRATORY_TRACT | Status: DC
  Administered 2023-06-21 – 2023-06-26 (×14): 2.5 mg via RESPIRATORY_TRACT
  Filled 2023-06-21 (×15): qty 3

## 2023-06-21 MED ORDER — FLUCONAZOLE 100 MG PO TABS
200.0000 mg | ORAL_TABLET | Freq: Once | ORAL | Status: AC
  Administered 2023-06-21: 200 mg via ORAL
  Filled 2023-06-21: qty 2

## 2023-06-21 MED ORDER — ACETAMINOPHEN 325 MG PO TABS
650.0000 mg | ORAL_TABLET | Freq: Four times a day (QID) | ORAL | Status: DC
Start: 2023-06-21 — End: 2023-06-26
  Administered 2023-06-21 – 2023-06-26 (×20): 650 mg via ORAL
  Filled 2023-06-21 (×20): qty 2

## 2023-06-21 MED ORDER — FLUCONAZOLE 100 MG PO TABS
100.0000 mg | ORAL_TABLET | Freq: Every day | ORAL | Status: DC
  Administered 2023-06-22 – 2023-06-26 (×4): 100 mg via ORAL
  Filled 2023-06-21 (×5): qty 1

## 2023-06-21 MED ORDER — DILTIAZEM HCL ER COATED BEADS 120 MG PO CP24
120.0000 mg | ORAL_CAPSULE | Freq: Every day | ORAL | Status: DC
  Administered 2023-06-21 – 2023-06-22 (×2): 120 mg via ORAL
  Filled 2023-06-21 (×3): qty 1

## 2023-06-21 MED ORDER — HYDROCODONE-ACETAMINOPHEN 5-325 MG PO TABS: 1.0000 | ORAL_TABLET | ORAL | Status: DC | PRN

## 2023-06-21 MED ORDER — OXYCODONE HCL 5 MG PO TABS: 5.0000 mg | ORAL_TABLET | Freq: Four times a day (QID) | ORAL | Status: DC | PRN

## 2023-06-21 NOTE — Assessment & Plan Note (Signed)
 Blood pressure and heart rate better.  Discontinued Cardizem  CD.  Blood pressure and heart rate likely elevated with pain.

## 2023-06-21 NOTE — Progress Notes (Addendum)
 Progress Note   Patient: Monica Singh ZOX:[id number] DOB: [birth year] DOA: 06/18/2023     0 DOS: the patient was seen and examined on 06/21/2023   Brief hospital course: 89 y.o. female with medical history significant for COPD , diastolic CHF, CKD stage IIIa, dementia, non-Hodgkin's lymphoma in remission, HTN, hypothyroidism, left UPJ obstruction status post stent on 5/2, and discharged 2 days ago from hospitalization from 5/17 to 06/17/2023 for COPD/CHF exacerbation and E. coli UTI, still on antibiotics who presents from her assisted memory care unit following a fall while attempting to ambulate with her new rollator.  She fell backwards, hitting her head and had new onset of back pain.  Daughter at bedside contributes to history. ED course and data review: Tachycardic to about 103 with otherwise unremarkable vitals Labs notable for mild leukocytosis Of 13,600 slightly up from 11.3 on 5/20 Creatinine 1.12 up from 0.74 Potassium 5.4 Troponin 7 EKG, personally viewed and interpreted showing sinus at 99 with nonspecific T wave changes Trauma imaging included CT head, C-spine, L-spine and hip, as well as chest x-ray-which was significant for a new superior L1 endplate  fracture and a stable left adnexal cyst of 7.3 cm   Patient was treated with fentanyl  for pain Given a 500 cc NS bolus Fitted with a TLSO brace, but was unable to ambulate with the brace so admission requested   5/22.  Family would like to do conservative management with pain control physical therapy and Occupational Therapy evaluations. 5/23.  Patient having a lot of pain and not doing much with physical therapy.  Consulted interventional radiology for kyphoplasty. 5/24.  Family concerned that patient is sleeping a lot.  Will get rid of IV pain medication.  Assessment and Plan: * Compression fracture of T11 vertebra (HCC) Ground-level fall while using rollator History of kyphoplasty 2019 Patient still in a lot of pain this  morning.  Need Tylenol  around-the-clock dosing.  As needed oxycodone .  Discontinue IV pain medication. Continue TLSO brace with working with physical therapy. PT OT consults appreciated Miacalcin nasal spray. Interventional radiology will do a kyphoplasty on Tuesday. (MRI commented on compression fracture of T10 while CT scan commented on T11) (also likely subacute fractures of L2 and L3).  Acute metabolic encephalopathy History of dementia.  Patient easily falls asleep.  Patient able to answer some questions and follow commands and move her extremities.  CT scan of the head ordered but still pending read.  Discontinued IV pain medication.  AKI (acute kidney injury) (HCC) Creatinine 1.12 up from baseline of 0.74 Received a 500 mL NS bolus in the ED and again yesterday. Repeat creatinine down to 0.87.  E-coli UTI Continue cefadroxil to completion on 5/24  Diastolic CHF, chronic (HCC) Clinically euvolemic to dry.  EF 60%   Hyponatremia Sodium 131.  COPD (chronic obstructive pulmonary disease) (HCC) Not acutely exacerbated, though had recent exacerbation.  Patient has upper airway congestion. Continue controller inhalers with DuoNebs as needed Will continue prednisone  taper from recent COPD exacerbation.  Essential hypertension And also tachycardia start Cardizem CD.  Adnexal mass 5.3 cm left adnexal mass.  Present since 2020.  Overweight (BMI 25.0-29.9) BMI 26.21  Nephrolithiasis History of cystoscopy with ureteral stent placement on 05/30/2023   Advanced dementia [facility]) Delirium precautions  Lymphoma of small bowel (HCC) No acute issues suspected        Subjective: Patient not feeling well.  Still having quite a bit of pain.  Made Tylenol  dose standing around-the-clock.  Family concerned  that she is sleeping a lot.  Physical Exam: Vitals:   06/20/23 1932 06/20/23 2001 06/21/23 0457 06/21/23 0736  BP:  130/68 (!) 154/82 (!) 164/77  Pulse:  (!) 108 (!) 103 (!)  107  Resp:  17 18 16   Temp:  99.1 F (37.3 C) 98.9 F (37.2 C) 98.7 F (37.1 C)  TempSrc:   Oral Oral  SpO2: 96% 96% 93% 95%  Weight:      Height:       Physical Exam HENT:     Head: Normocephalic.  Eyes:     General: Lids are normal.     Conjunctiva/sclera: Conjunctivae normal.  Cardiovascular:     Rate and Rhythm: Normal rate and regular rhythm.     Heart sounds: Normal heart sounds, S1 normal and S2 normal.  Pulmonary:     Breath sounds: Transmitted upper airway sounds present. Examination of the right-lower field reveals decreased breath sounds. Examination of the left-lower field reveals decreased breath sounds. Decreased breath sounds present. No wheezing or rhonchi.     Comments: Upper airway congestion Abdominal:     Palpations: Abdomen is soft.     Tenderness: There is no abdominal tenderness.  Musculoskeletal:     Right lower leg: No swelling.     Left lower leg: No swelling.  Skin:    General: Skin is warm.     Findings: No rash.  Neurological:     Mental Status: She is lethargic.     Comments: Patient barely able to straight leg raise bilaterally.     Data Reviewed: CT head pending read Last sodium 131 last creatinine 0.87  Family Communication: Family at bedside  Disposition: Status is: Inpatient Remains inpatient appropriate because: Patient will have kyphoplasty on Tuesday.  CT scan of the head today.  Discontinue IV pain medication.  Planned Discharge Destination: Rehab    Time spent: 28 minutes  Author: Verla Glaze, MD 06/21/2023 2:10 PM  For on call review [email or web address].

## 2023-06-21 NOTE — Assessment & Plan Note (Addendum)
 History of dementia.  Patient easily falls asleep.  Patient able to answer some questions and follow commands and move her extremities.  CT scan of the head ordered but still pending read.  Discontinued IV pain medication.

## 2023-06-21 NOTE — Plan of Care (Signed)
  Problem: Health Behavior/Discharge Planning: Goal: Ability to manage health-related needs will improve Outcome: Progressing   Problem: Clinical Measurements: Goal: Ability to maintain clinical measurements within normal limits will improve Outcome: Progressing Goal: Will remain free from infection Outcome: Progressing Goal: Diagnostic test results will improve Outcome: Progressing   Problem: Activity: Goal: Risk for activity intolerance will decrease Outcome: Not Progressing   Problem: Nutrition: Goal: Adequate nutrition will be maintained Outcome: Not Progressing

## 2023-06-21 NOTE — Plan of Care (Signed)

## 2023-06-21 NOTE — Progress Notes (Signed)
 BP 164/77, HR 107. MD notified and is at bedside.

## 2023-06-22 DIAGNOSIS — S22080S Wedge compression fracture of T11-T12 vertebra, sequela: Secondary | ICD-10-CM | POA: Diagnosis not present

## 2023-06-22 DIAGNOSIS — N390 Urinary tract infection, site not specified: Secondary | ICD-10-CM | POA: Diagnosis not present

## 2023-06-22 DIAGNOSIS — G9341 Metabolic encephalopathy: Secondary | ICD-10-CM | POA: Diagnosis not present

## 2023-06-22 DIAGNOSIS — I5032 Chronic diastolic (congestive) heart failure: Secondary | ICD-10-CM | POA: Diagnosis not present

## 2023-06-22 DIAGNOSIS — I10 Essential (primary) hypertension: Secondary | ICD-10-CM

## 2023-06-22 LAB — BASIC METABOLIC PANEL WITH GFR
Anion gap: 10 (ref 5–15)
BUN: 22 mg/dL (ref 8–23)
CO2: 26 mmol/L (ref 22–32)
Calcium: 8.4 mg/dL — ABNORMAL LOW (ref 8.9–10.3)
Chloride: 93 mmol/L — ABNORMAL LOW (ref 98–111)
Creatinine, Ser: 0.75 mg/dL (ref 0.44–1.00)
GFR, Estimated: >60 mL/min (ref >60)
Glucose, Bld: 104 mg/dL — ABNORMAL HIGH (ref 70–99)
Potassium: 3.6 mmol/L (ref 3.5–5.1)
Sodium: 129 mmol/L — ABNORMAL LOW (ref 135–145)

## 2023-06-22 LAB — CBC
HCT: 34.1 % — ABNORMAL LOW (ref 36.0–46.0)
Hemoglobin: 11.1 g/dL — ABNORMAL LOW (ref 12.0–15.0)
MCH: 28.5 pg (ref 26.0–34.0)
MCHC: 32.6 g/dL (ref 30.0–36.0)
MCV: 87.7 fL (ref 80.0–100.0)
Platelets: 341 10*3/uL (ref 150–400)
RBC: 3.89 MIL/uL (ref 3.87–5.11)
RDW: 14.9 % (ref 11.5–15.5)
WBC: 9.5 10*3/uL (ref 4.0–10.5)
nRBC: 0 % (ref 0.0–0.2)

## 2023-06-22 MED ORDER — BETHANECHOL CHLORIDE 10 MG PO TABS
10.0000 mg | ORAL_TABLET | Freq: Once | ORAL | Status: AC
  Administered 2023-06-22: 10 mg via ORAL
  Filled 2023-06-22: qty 1

## 2023-06-22 NOTE — Progress Notes (Signed)
 Progress Note   Patient: Monica Singh WUJ:[id number] DOB: [birth year] DOA: 06/18/2023     1 DOS: the patient was seen and examined on 06/22/2023   Brief hospital course: 89 y.o. female with medical history significant for COPD , diastolic CHF, CKD stage IIIa, dementia, non-Hodgkin's lymphoma in remission, HTN, hypothyroidism, left UPJ obstruction status post stent on 5/2, and discharged 2 days ago from hospitalization from 5/17 to 06/17/2023 for COPD/CHF exacerbation and E. coli UTI, still on antibiotics who presents from her assisted memory care unit following a fall while attempting to ambulate with her new rollator.  She fell backwards, hitting her head and had new onset of back pain.  Daughter at bedside contributes to history. ED course and data review: Tachycardic to about 103 with otherwise unremarkable vitals Labs notable for mild leukocytosis Of 13,600 slightly up from 11.3 on 5/20 Creatinine 1.12 up from 0.74 Potassium 5.4 Troponin 7 EKG, personally viewed and interpreted showing sinus at 99 with nonspecific T wave changes Trauma imaging included CT head, C-spine, L-spine and hip, as well as chest x-ray-which was significant for a new superior L1 endplate  fracture and a stable left adnexal cyst of 7.3 cm   Patient was treated with fentanyl  for pain Given a 500 cc NS bolus Fitted with a TLSO brace, but was unable to ambulate with the brace so admission requested   5/22.  Family would like to do conservative management with pain control physical therapy and Occupational Therapy evaluations. 5/23.  Patient having a lot of pain and not doing much with physical therapy.  Consulted interventional radiology for kyphoplasty. 5/24.  Family concerned that patient is sleeping a lot.  Will get rid of IV pain medication. 9/25.  Patient not having pain if she does not move.  Patient awake this morning.  Still has upper airway congestion.  Assessment and Plan: * Compression fracture of T11  vertebra (HCC) Ground-level fall while using rollator History of kyphoplasty 2019 Patient still in a lot of pain this morning.  Need Tylenol  around-the-clock dosing.  As needed oxycodone .  Discontinue IV pain medication. Continue TLSO brace with working with physical therapy. PT OT consults appreciated Miacalcin nasal spray. Interventional radiology will do a kyphoplasty on Tuesday. (MRI commented on compression fracture of T10 while CT scan commented on T11) (also likely subacute fractures of L2 and L3).  Acute metabolic encephalopathy History of dementia.  Patient easily falls asleep.  Patient able to answer some questions and follow commands and move her extremities.  CT scan of the head ordered but still pending read.  Discontinued IV pain medication.  AKI (acute kidney injury) (HCC) Creatinine 1.12 up from baseline of 0.74 Received a 500 mL NS bolus in the ED and again yesterday. Repeat creatinine down to 0.87.  E-coli UTI Continue cefadroxil to completion on 5/24  Diastolic CHF, chronic (HCC) Clinically euvolemic to dry.  EF 60%   Hyponatremia Sodium 131.  COPD (chronic obstructive pulmonary disease) (HCC) Not acutely exacerbated, though had recent exacerbation.  Patient has upper airway congestion. Continue controller inhalers with DuoNebs as needed Will continue prednisone  taper from recent COPD exacerbation.  Essential hypertension And also tachycardia start Cardizem CD.  Adnexal mass 5.3 cm left adnexal mass.  Present since 2020.  Overweight (BMI 25.0-29.9) BMI 26.21  Nephrolithiasis History of cystoscopy with ureteral stent placement on 05/30/2023   Advanced dementia [facility]) Delirium precautions  Lymphoma of small bowel (HCC) No acute issues suspected  Subjective: Patient more awake today than yesterday.  Does not have pain if she stays still.  Admitted with compression fracture.  For kyphoplasty on Tuesday.  Physical Exam: Vitals:   06/21/23  1946 06/21/23 2042 06/22/23 0525 06/22/23 0724  BP: 115/62  122/60 115/73  Pulse: 85  81 81  Resp: 16  (!) 22 18  Temp: 98.6 F (37 C)  98.2 F (36.8 C) 98.1 F (36.7 C)  TempSrc: Oral  Oral Oral  SpO2: 97% 97% 98% 97%  Weight:      Height:       Physical Exam HENT:     Head: Normocephalic.  Eyes:     General: Lids are normal.     Conjunctiva/sclera: Conjunctivae normal.  Cardiovascular:     Rate and Rhythm: Normal rate and regular rhythm.     Heart sounds: Normal heart sounds, S1 normal and S2 normal.  Pulmonary:     Breath sounds: Transmitted upper airway sounds present. Examination of the right-lower field reveals decreased breath sounds. Examination of the left-lower field reveals decreased breath sounds. Decreased breath sounds present. No wheezing or rhonchi.     Comments: Upper airway congestion Abdominal:     Palpations: Abdomen is soft.     Tenderness: There is no abdominal tenderness.  Musculoskeletal:     Right lower leg: No swelling.     Left lower leg: No swelling.  Skin:    General: Skin is warm.     Findings: No rash.  Neurological:     Mental Status: She is alert.     Data Reviewed: Sodium 129, creatinine 0.75, white blood cell count 9.5, hemoglobin 11.1, platelet count 341  Family Communication: Family at bedside  Disposition: Status is: Inpatient Remains inpatient appropriate because: For kyphoplasty on Tuesday  Planned Discharge Destination: Rehab    Time spent: 28 minutes  Author: Verla Glaze, MD 06/22/2023 11:01 AM  For on call review [email or web address].

## 2023-06-22 NOTE — TOC Transition Note (Signed)
 Transition of Care [facility]) - Discharge Note   Patient Details  Name: RHYS ANCHONDO MRN: [id number] Date of Birth: 1928-12-25  [facility]) CM/SW Contact:  Alexandra Ice, RN Phone Number: 06/22/2023, 3:01 PM   Clinical Narrative:    Kyphoplasty scheduled for Tuesday. Patient will need auth if returning to [facility] as skilled. TOC will continue to monitor         Patient Goals and CMS Choice            Discharge Placement                       Discharge Plan and Services Additional resources added to the After Visit Summary for                                       Social Drivers of Health (SDOH) Interventions SDOH Screenings   Food Insecurity: No Food Insecurity (06/19/2023)  Housing: Low Risk  (06/19/2023)  Transportation Needs: No Transportation Needs (06/19/2023)  Utilities: Not At Risk (06/19/2023)  Alcohol Screen: Low Risk  (08/19/2022)  Depression (PHQ2-9): Low Risk  (08/19/2022)  Financial Resource Strain: Low Risk  (08/19/2022)  Physical Activity: Insufficiently Active (08/19/2022)  Social Connections: Socially Isolated (06/19/2023)  Stress: No Stress Concern Present (08/19/2022)  Tobacco Use: Medium Risk (06/18/2023)  Health Literacy: Inadequate Health Literacy (08/19/2022)     Readmission Risk Interventions     No data to display

## 2023-06-23 ENCOUNTER — Encounter: Payer: Self-pay | Admitting: Internal Medicine

## 2023-06-23 DIAGNOSIS — N179 Acute kidney failure, unspecified: Secondary | ICD-10-CM | POA: Diagnosis not present

## 2023-06-23 DIAGNOSIS — N390 Urinary tract infection, site not specified: Secondary | ICD-10-CM | POA: Diagnosis not present

## 2023-06-23 DIAGNOSIS — B370 Candidal stomatitis: Secondary | ICD-10-CM | POA: Insufficient documentation

## 2023-06-23 DIAGNOSIS — S22080S Wedge compression fracture of T11-T12 vertebra, sequela: Secondary | ICD-10-CM | POA: Diagnosis not present

## 2023-06-23 DIAGNOSIS — R338 Other retention of urine: Secondary | ICD-10-CM

## 2023-06-23 DIAGNOSIS — G9341 Metabolic encephalopathy: Secondary | ICD-10-CM | POA: Diagnosis not present

## 2023-06-23 LAB — BASIC METABOLIC PANEL WITH GFR
Anion gap: 8 (ref 5–15)
BUN: 23 mg/dL (ref 8–23)
CO2: 26 mmol/L (ref 22–32)
Calcium: 7.8 mg/dL — ABNORMAL LOW (ref 8.9–10.3)
Chloride: 96 mmol/L — ABNORMAL LOW (ref 98–111)
Creatinine, Ser: 0.72 mg/dL (ref 0.44–1.00)
GFR, Estimated: >60 mL/min (ref >60)
Glucose, Bld: 109 mg/dL — ABNORMAL HIGH (ref 70–99)
Potassium: 3.8 mmol/L (ref 3.5–5.1)
Sodium: 130 mmol/L — ABNORMAL LOW (ref 135–145)

## 2023-06-23 MED ORDER — SENNOSIDES-DOCUSATE SODIUM 8.6-50 MG PO TABS
2.0000 | ORAL_TABLET | Freq: Two times a day (BID) | ORAL | Status: DC
  Administered 2023-06-23 – 2023-06-26 (×5): 2 via ORAL
  Filled 2023-06-23 (×5): qty 2

## 2023-06-23 MED ORDER — CHLORHEXIDINE GLUCONATE CLOTH 2 % EX PADS
6.0000 | MEDICATED_PAD | Freq: Every day | CUTANEOUS | Status: DC
Start: 2023-06-23 — End: 2023-06-26
  Administered 2023-06-23 – 2023-06-24 (×2): 6 via TOPICAL

## 2023-06-23 MED ORDER — POLYETHYLENE GLYCOL 3350 17 G PO PACK
17.0000 g | PACK | Freq: Two times a day (BID) | ORAL | Status: DC
  Administered 2023-06-23 – 2023-06-26 (×5): 17 g via ORAL
  Filled 2023-06-23 (×5): qty 1

## 2023-06-23 MED ORDER — FLEET ENEMA RE ENEM: 1.0000 | ENEMA | Freq: Every day | RECTAL | Status: DC | PRN

## 2023-06-23 MED ORDER — BISACODYL 10 MG RE SUPP
10.0000 mg | Freq: Once | RECTAL | Status: AC
  Administered 2023-06-23: 10 mg via RECTAL
  Filled 2023-06-23: qty 1

## 2023-06-23 MED ORDER — ACETYLCYSTEINE 20 % IN SOLN
3.0000 mL | Freq: Two times a day (BID) | RESPIRATORY_TRACT | Status: DC
  Administered 2023-06-23 – 2023-06-26 (×6): 3 mL via RESPIRATORY_TRACT
  Filled 2023-06-23 (×8): qty 4

## 2023-06-23 NOTE — Assessment & Plan Note (Signed)
 Improving on the tongue.  Continue Diflucan

## 2023-06-23 NOTE — Plan of Care (Signed)

## 2023-06-23 NOTE — Assessment & Plan Note (Signed)
 Patient had In-N-Out catheterizations over the last 24 hours.  Foley catheter placed this morning for urinary retention.

## 2023-06-23 NOTE — Progress Notes (Signed)
 Progress Note   Patient: Monica Singh WJX:[id number] DOB: 04/12/1928 DOA: 06/18/2023     2 DOS: the patient was seen and examined on 06/23/2023   Brief hospital course: 89 y.o. female with medical history significant for COPD , diastolic CHF, CKD stage IIIa, dementia, non-Hodgkin's lymphoma in remission, HTN, hypothyroidism, left UPJ obstruction status post stent on 5/2, and discharged 2 days ago from hospitalization from 5/17 to 06/17/2023 for COPD/CHF exacerbation and E. coli UTI, still on antibiotics who presents from her assisted memory care unit following a fall while attempting to ambulate with her new rollator.  She fell backwards, hitting her head and had new onset of back pain.  Daughter at bedside contributes to history. ED course and data review: Tachycardic to about 103 with otherwise unremarkable vitals Labs notable for mild leukocytosis Of 13,600 slightly up from 11.3 on 5/20 Creatinine 1.12 up from 0.74 Potassium 5.4 Troponin 7 EKG, personally viewed and interpreted showing sinus at 99 with nonspecific T wave changes Trauma imaging included CT head, C-spine, L-spine and hip, as well as chest x-ray-which was significant for a new superior L1 endplate  fracture and a stable left adnexal cyst of 7.3 cm   Patient was treated with fentanyl  for pain Given a 500 cc NS bolus Fitted with a TLSO brace, but was unable to ambulate with the brace so admission requested   5/22.  Family would like to do conservative management with pain control physical therapy and Occupational Therapy evaluations. 5/23.  Patient having a lot of pain and not doing much with physical therapy.  Consulted interventional radiology for kyphoplasty. 5/24.  Family concerned that patient is sleeping a lot.  Will get rid of IV pain medication. 5/25.  Patient not having pain if she does not move.  Patient awake this morning.  Still has upper airway congestion. 5/26.  Foley catheter placed for urinary retention.   Patient still having upper airway congestion will add Mucomyst nebulizers.  Assessment and Plan: * Compression fracture of T11 vertebra (HCC) Ground-level fall while using rollator History of kyphoplasty 2019 Patient still in a lot of pain this morning.  Need Tylenol  around-the-clock dosing.  As needed oxycodone .  Discontinued IV pain medication on 5/24. Continue TLSO brace with working with physical therapy. PT OT consults appreciated Miacalcin nasal spray. Interventional radiology will do a kyphoplasty on 5/27. (MRI commented on compression fracture of T10 while CT scan commented on T11) (also likely subacute fractures of L2 and L3).  Acute metabolic encephalopathy History of dementia.  Mental status better today.  CT scan of the head negative.  AKI (acute kidney injury) (HCC) Creatinine 1.12 up from baseline of 0.74 Repeat creatinine down to 0.72  E-coli UTI Cefadroxil completed  Diastolic CHF, chronic (HCC) Clinically euvolemic to dry.  EF 60%   Hyponatremia Sodium 130  COPD (chronic obstructive pulmonary disease) (HCC) Not acutely exacerbated, though had recent exacerbation.  Patient has upper airway congestion.  Continue standing dose nebulizers and inhalers.  Continue incentive spirometry and flutter valve.  Taper prednisone .  Add Mucomyst nebulizers.  Essential hypertension Blood pressure and heart rate better.  Discontinue Cardizem CD.  Adnexal mass 5.3 cm left adnexal mass.  Present since 2020.  Acute urinary retention Patient had In-N-Out catheterizations over the last 24 hours.  Foley catheter placed this morning for urinary retention.  Thrush Continue Diflucan   Overweight (BMI 25.0-29.9) BMI 26.21  Nephrolithiasis History of cystoscopy with ureteral stent placement on 05/30/2023   Advanced dementia [facility]) Delirium  precautions  Lymphoma of small bowel (HCC) No acute issues suspected        Subjective: Patient states her pain in the back is  horrible.  Otherwise feels okay.  Required Foley catheter placement for urinary retention.  Still has upper airway congestion.  Mucomyst nebulizers prescribed.  Admitted with compression fracture.  Physical Exam: Vitals:   06/22/23 1957 06/22/23 2034 06/23/23 0240 06/23/23 0754  BP: 116/66  (!) 106/47 (!) 104/54  Pulse: 89  79 77  Resp: 20  16 16   Temp: 98.6 F (37 C)  98.1 F (36.7 C) 98.1 F (36.7 C)  TempSrc:    Oral  SpO2: 98% 98% 97% 97%  Weight:      Height:       Physical Exam HENT:     Head: Normocephalic.     Mouth/Throat:     Comments: Thrush Eyes:     General: Lids are normal.     Conjunctiva/sclera: Conjunctivae normal.  Cardiovascular:     Rate and Rhythm: Normal rate and regular rhythm.     Heart sounds: Normal heart sounds, S1 normal and S2 normal.  Pulmonary:     Breath sounds: Transmitted upper airway sounds present. Examination of the right-lower field reveals decreased breath sounds. Examination of the left-lower field reveals decreased breath sounds. Decreased breath sounds present. No wheezing or rhonchi.     Comments: Upper airway congestion Abdominal:     Palpations: Abdomen is soft.     Tenderness: There is no abdominal tenderness.  Musculoskeletal:     Right lower leg: No swelling.     Left lower leg: No swelling.  Skin:    General: Skin is warm.     Findings: No rash.  Neurological:     Mental Status: She is alert.     Comments: Patient able to move both of her legs and bend at the knee while lying in the bed.     Data Reviewed: Sodium 130, creatinine 0.72  Family Communication: Family at bedside  Disposition: Status is: Inpatient Remains inpatient appropriate because: For kyphoplasty tomorrow  Planned Discharge Destination: Rehab    Time spent: 27 minutes  Author: Verla Glaze, MD 06/23/2023 12:04 PM  For on call review [email or web address].

## 2023-06-24 ENCOUNTER — Inpatient Hospital Stay: Admitting: Radiology

## 2023-06-24 DIAGNOSIS — K5909 Other constipation: Secondary | ICD-10-CM

## 2023-06-24 DIAGNOSIS — B370 Candidal stomatitis: Secondary | ICD-10-CM

## 2023-06-24 DIAGNOSIS — I5032 Chronic diastolic (congestive) heart failure: Secondary | ICD-10-CM | POA: Diagnosis not present

## 2023-06-24 DIAGNOSIS — I10 Essential (primary) hypertension: Secondary | ICD-10-CM

## 2023-06-24 DIAGNOSIS — G9341 Metabolic encephalopathy: Secondary | ICD-10-CM

## 2023-06-24 DIAGNOSIS — N390 Urinary tract infection, site not specified: Secondary | ICD-10-CM | POA: Diagnosis not present

## 2023-06-24 DIAGNOSIS — K5900 Constipation, unspecified: Secondary | ICD-10-CM | POA: Insufficient documentation

## 2023-06-24 DIAGNOSIS — S22080S Wedge compression fracture of T11-T12 vertebra, sequela: Secondary | ICD-10-CM | POA: Diagnosis not present

## 2023-06-24 DIAGNOSIS — R338 Other retention of urine: Secondary | ICD-10-CM

## 2023-06-24 HISTORY — PX: IR KYPHO THORACIC WITH BONE BIOPSY: IMG5518

## 2023-06-24 LAB — CBC WITH DIFFERENTIAL/PLATELET
Abs Immature Granulocytes: 0.13 10*3/uL — ABNORMAL HIGH (ref 0.00–0.07)
Basophils Absolute: 0 10*3/uL (ref 0.0–0.1)
Basophils Relative: 0 %
Eosinophils Absolute: 0 10*3/uL (ref 0.0–0.5)
Eosinophils Relative: 0 %
HCT: 31.9 % — ABNORMAL LOW (ref 36.0–46.0)
Hemoglobin: 10.7 g/dL — ABNORMAL LOW (ref 12.0–15.0)
Immature Granulocytes: 1 %
Lymphocytes Relative: 18 %
Lymphs Abs: 1.9 10*3/uL (ref 0.7–4.0)
MCH: 29.2 pg (ref 26.0–34.0)
MCHC: 33.5 g/dL (ref 30.0–36.0)
MCV: 87.2 fL (ref 80.0–100.0)
Monocytes Absolute: 0.7 10*3/uL (ref 0.1–1.0)
Monocytes Relative: 7 %
Neutro Abs: 7.7 10*3/uL (ref 1.7–7.7)
Neutrophils Relative %: 74 %
Platelets: 345 10*3/uL (ref 150–400)
RBC: 3.66 MIL/uL — ABNORMAL LOW (ref 3.87–5.11)
RDW: 14.7 % (ref 11.5–15.5)
WBC: 10.4 10*3/uL (ref 4.0–10.5)
nRBC: 0 % (ref 0.0–0.2)

## 2023-06-24 LAB — BASIC METABOLIC PANEL WITH GFR
Anion gap: 9 (ref 5–15)
BUN: 21 mg/dL (ref 8–23)
CO2: 24 mmol/L (ref 22–32)
Calcium: 8.6 mg/dL — ABNORMAL LOW (ref 8.9–10.3)
Chloride: 96 mmol/L — ABNORMAL LOW (ref 98–111)
Creatinine, Ser: 0.8 mg/dL (ref 0.44–1.00)
GFR, Estimated: >60 mL/min (ref >60)
Glucose, Bld: 126 mg/dL — ABNORMAL HIGH (ref 70–99)
Potassium: 3.8 mmol/L (ref 3.5–5.1)
Sodium: 129 mmol/L — ABNORMAL LOW (ref 135–145)

## 2023-06-24 LAB — PROTIME-INR
INR: 1 (ref 0.8–1.2)
Prothrombin Time: 13 s (ref 11.4–15.2)

## 2023-06-24 MED ORDER — MIDAZOLAM HCL 2 MG/2ML IJ SOLN
INTRAMUSCULAR | Status: AC
  Filled 2023-06-24: qty 2

## 2023-06-24 MED ORDER — PREDNISONE 10 MG PO TABS
10.0000 mg | ORAL_TABLET | Freq: Every day | ORAL | Status: DC
  Administered 2023-06-25 – 2023-06-26 (×2): 10 mg via ORAL
  Filled 2023-06-24 (×2): qty 1

## 2023-06-24 MED ORDER — FENTANYL CITRATE (PF) 100 MCG/2ML IJ SOLN
INTRAMUSCULAR | Status: AC | PRN
  Administered 2023-06-24 (×5): 25 ug via INTRAVENOUS

## 2023-06-24 MED ORDER — FENTANYL CITRATE (PF) 100 MCG/2ML IJ SOLN
INTRAMUSCULAR | Status: AC
  Filled 2023-06-24: qty 2

## 2023-06-24 MED ORDER — LACTULOSE 10 GM/15ML PO SOLN: 20.0000 g | Freq: Once | ORAL | Status: DC

## 2023-06-24 MED ORDER — LIDOCAINE HCL (PF) 1 % IJ SOLN
20.0000 mL | Freq: Once | INTRAMUSCULAR | Status: AC
  Administered 2023-06-24: 20 mL via INTRADERMAL
  Filled 2023-06-24: qty 20

## 2023-06-24 MED ORDER — ENOXAPARIN SODIUM 40 MG/0.4ML IJ SOSY
40.0000 mg | PREFILLED_SYRINGE | INTRAMUSCULAR | Status: DC
  Administered 2023-06-25 – 2023-06-26 (×2): 40 mg via SUBCUTANEOUS
  Filled 2023-06-24 (×2): qty 0.4

## 2023-06-24 MED ORDER — LIDOCAINE HCL (PF) 1 % IJ SOLN
INTRAMUSCULAR | Status: AC
  Filled 2023-06-24: qty 30

## 2023-06-24 MED ORDER — MIDAZOLAM HCL 2 MG/2ML IJ SOLN
INTRAMUSCULAR | Status: AC
Start: 2023-06-24 — End: ?
  Filled 2023-06-24: qty 2

## 2023-06-24 MED ORDER — CEFAZOLIN SODIUM-DEXTROSE 2-4 GM/100ML-% IV SOLN
INTRAVENOUS | Status: AC
Start: 2023-06-24 — End: ?
  Filled 2023-06-24: qty 100

## 2023-06-24 MED ORDER — MIDAZOLAM HCL 2 MG/2ML IJ SOLN
INTRAMUSCULAR | Status: AC | PRN
  Administered 2023-06-24 (×5): .5 mg via INTRAVENOUS

## 2023-06-24 NOTE — TOC Progression Note (Addendum)
 Transition of Care [facility]) - Progression Note    Patient Details  Name: Monica Singh MRN: [id number] Date of Birth: [birth year]  [facility]) CM/SW Contact  Alexandra Ice, RN Phone Number: 06/24/2023, 3:52 PM  Clinical Narrative:    Patient scheduled to have kyphoplasty today, still needing rehab after hospitalization. She will need updated PT and OT notes to submit authorization.        Expected Discharge Plan and Services                                               Social Determinants of Health (SDOH) Interventions SDOH Screenings   Food Insecurity: No Food Insecurity (06/19/2023)  Housing: Low Risk  (06/19/2023)  Transportation Needs: No Transportation Needs (06/19/2023)  Utilities: Not At Risk (06/19/2023)  Alcohol Screen: Low Risk  (08/19/2022)  Depression (PHQ2-9): Low Risk  (08/19/2022)  Financial Resource Strain: Low Risk  (08/19/2022)  Physical Activity: Insufficiently Active (08/19/2022)  Social Connections: Socially Isolated (06/19/2023)  Stress: No Stress Concern Present (08/19/2022)  Tobacco Use: Medium Risk (06/18/2023)  Health Literacy: Inadequate Health Literacy (08/19/2022)    Readmission Risk Interventions     No data to display

## 2023-06-24 NOTE — Evaluation (Signed)
 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   NON-HODGKINS-stage 3, cd 20 positive; status post 6 cycles of R-CHOP   DNR (do not resuscitate)    Dyspnea    with exertion   Essential hypertension 04/10/2022   Herpes zoster without complication 01/31/2009   History of hiatal hernia    HOH (hard of hearing)    wears hearing aides   Hypothyroidism    Mitral regurgitation    Non Hodgkin's lymphoma (HCC)    Nursing home resident    Obstruction of left ureteropelvic junction (UPJ)    Personal history of chemotherapy    PONV (postoperative nausea and vomiting)    in January lasted about 4 hours   Recurrent UTI    Squamous cell carcinoma of skin 11/09/2008   Right post. lat. elbow. SCCis arising in AK. Excised 01/04/2009, margins free.    Squamous cell carcinoma of skin 07/26/2020   right hand   Syncope    Past Surgical History:  Past Surgical History:  Procedure Laterality Date   ABDOMINAL SURGERY  2009   abdominal mass+ NH lymphoma,   BACK SURGERY  01/2017   fusion. metal plate in neck at back   CATARACT EXTRACTION  1997   right eye and left ey   cervical neck fusion  1995   CYSTOSCOPY W/ RETROGRADES Left 01/30/2018   Procedure: CYSTOSCOPY WITH RETROGRADE PYELOGRAM;  Surgeon: Lawerence Pressman, MD;  Location: [facility];  Service: Urology;  Laterality: Left;   CYSTOSCOPY W/ RETROGRADES Left 08/05/2018   Procedure:  CYSTOSCOPY WITH RETROGRADE PYELOGRAM;  Surgeon: Lawerence Pressman, MD;  Location: [facility];  Service: Urology;  Laterality: Left;   CYSTOSCOPY W/ RETROGRADES Left 06/01/2021   Procedure: CYSTOSCOPY WITH RETROGRADE PYELOGRAM;  Surgeon: Lawerence Pressman, MD;  Location: [facility];  Service: Urology;  Laterality: Left;   CYSTOSCOPY W/ URETERAL STENT PLACEMENT Left 08/05/2018   Procedure: CYSTOSCOPY WITH STENT Exchange;  Surgeon: Lawerence Pressman, MD;  Location: [facility];  Service: Urology;  Laterality: Left;   CYSTOSCOPY W/ URETERAL STENT PLACEMENT Left 05/21/2019   Procedure: CYSTOSCOPY WITH STENT REPLACEMENT;  Surgeon: Lawerence Pressman, MD;  Location: [facility];  Service: Urology;  Laterality: Left;   CYSTOSCOPY W/ URETERAL STENT PLACEMENT Left 05/05/2020   Procedure: CYSTOSCOPY WITH RETROGRADE PYELOGRAM/URETERAL STENT EXCHANGE;  Surgeon: Lawerence Pressman, MD;  Location: [facility];  Service: Urology;  Laterality: Left;   CYSTOSCOPY W/ URETERAL STENT PLACEMENT Left 06/01/2021   Procedure: CYSTOSCOPY WITH STENT EXCHANGE;  Surgeon: Lawerence Pressman, MD;  Location: [facility];  Service: Urology;  Laterality: Left;   CYSTOSCOPY W/ URETERAL STENT PLACEMENT Left 05/30/2023   Procedure: CYSTOSCOPY, FLEXIBLE, WITH STENT REPLACEMENT;  Surgeon: Lawerence Pressman, MD;  Location: [facility];  Service: Urology;  Laterality: Left;   CYSTOSCOPY WITH BIOPSY Left 02/27/2018   Procedure: CYSTOSCOPY WITH BIOPSY;  Surgeon: Lawerence Pressman, MD;  Location: [facility];  Service: Urology;  Laterality: Left;   CYSTOSCOPY WITH STENT PLACEMENT Left 01/30/2018   Procedure: CYSTOSCOPY WITH STENT PLACEMENT;  Surgeon: Lawerence Pressman, MD;  Location: [facility];  Service: Urology;  Laterality: Left;   CYSTOSCOPY WITH STENT PLACEMENT Left 07/12/2022   Procedure: CYSTOSCOPY WITH STENT EXCHANGE;  Surgeon: Lawerence Pressman, MD;  Location: [facility];  Service: Urology;  Laterality: Left;   CYSTOSCOPY WITH URETEROSCOPY AND STENT PLACEMENT Left 02/27/2018    Procedure: CYSTOSCOPY WITH URETEROSCOPY AND STENT Exchange;  Surgeon: Lawerence Pressman, MD;  Location: [facility];  Service: Urology;  Laterality: Left;   DENTAL SURGERY     screws   IR KYPHO THORACIC WITH BONE BIOPSY  06/24/2023   JOINT REPLACEMENT Left    knee   KNEE ARTHROPLASTY Right 12/23/2018   Procedure: RIGHT COMPUTER ASSISTED TOTAL KNEE ARTHROPLASTY;  Surgeon: Arlyne Lame, MD;  Location: [facility];  Service: Orthopedics;  Laterality: Right;   KYPHOPLASTY N/A 02/21/2017   Procedure: [id number];  Surgeon: Molli Angelucci, MD;  Location: [facility];  Service: Orthopedics;  Laterality: N/A;   laparotomy with biopsy  03/02/2007   PORTACATH PLACEMENT  2009   SPINE SURGERY  1997   SQUAMOUS CELL CARCINOMA EXCISION     right arm   TOTAL KNEE ARTHROPLASTY Left    2009   URETEROSCOPY Left 01/30/2018   Procedure: URETEROSCOPY;  Surgeon: Lawerence Pressman, MD;  Location: [facility];  Service: Urology;  Laterality: Left;   VAGINAL HYSTERECTOMY  1971   HPI:  Pt is a 89 y.o. female with medical history significant of Dementia, Larger Hiatal Hernia, COPD Gold stage I, mitral regurgitation, CKD stage IIIa, non-Hodgkin's lymphoma in remission, HTN, hypothyroidism, left UPJ obstruction status post stenting recently, sent from nursing home for new onset of cough wheezing shortness of breath after coughing up Phlegm.  She is HOH w/ HAs.  At this admit, she was attempting to walk w/ a new rollator and Fell backwards, hitting her head and had new onset of back pain.  Kyphoplasty procedure planned for today per NSG.   Chest Imaging: No active disease. Similar noted in CXRs of past 1+ years.  CT Imaging: Stomach/Bowel:  Moderate to Large hiatal hernia, greater than 50% of  the Stomach is intrathoracic.    Assessment / Plan / Recommendation  Clinical Impression   Pt seen for BSE today, however, she was NPO for a kyphoplasty procedure today. Pt resting in bed; HOH but w/ HAs. Verbally responded w/ Family, staff. Has  Baseline Dementia. Daughters present. NSG reported no known swallowing issues.   On [place] O2 1-2L. Afebrile. WBC wnl.   Per chart notes, pt has a Baseline issue of "Moderate to Large hiatal hernia, greater than 50% of  the Stomach is intrathoracic", per chart Imaging. Family is aware.  Per conversation w/ Family members/Dtrs, pt exhibited Regurgitation episodes in the last 1-2 days; last/recent admit(5/17) described coughing up of Phlegm and change in respiratory status. Pt admitted this admit w/ upper airway congestion which the MD is treating. Pt also has Baseline COPD.  Family and NSG denied any reports of difficulty swallowing; Daughters have not seen such when w/ pt during oral intake. From their description, pt appears to present w/ a functional oropharyngeal phase swallow. Chart review does not indicate a concern for oropharyngeal phase dysphagia; no pneumonia history per Imaging.  Pt appears at reduced risk for aspiration from an oropharyngeal phase standpoint when following general aspiration precautions. However,  pt does have challenging factors that could impact her oropharyngeal swallowing to include discomfort s/p Fall(kyphoplasty today), lack of getting up and moving around for ~1 week since the Fall, deconditioning/weakness, and advanced age as well as the Mod-Large HIATAL HERNIA -- ANY Esophageal phase Dysmotility can increase risk for aspiration of REFLUX material during a Regurgitation episode. These factors can increase risk for dysphagia as well as decreased oral intake overall.   Lengthy discussion and meeting w/ pt/Family covered pt's chart review and history, current c/o congestion, and lengthy discussion on general swallowing and the impact of Esophageal phase Dysmotility, including a HERNIA. Discussion re: general REFLUX Precautions and food prep/options included recommendation for well-Cut/Chopped meats if eating meats, moistened foods; Thin liquids less straw use d/t air swallowed,  and pt should help to Hold Cup when drinking. Avoid problematic foods that are making her feel "full" quickly; ID'ing successful foods easy to clear the Esophagus. Recommend general aspiration/REFLUX precautions including Small bites/sips and alternating food/liquid; Rest Breaks during meals. Recommended Pills WHOLE in Puree for safer, easier swallowing and clearing -- it was encouraged now and for D/C to the Family.   Thorough Education given on the above; Handouts left for Family. ST services will f/u tomorrow if any further need for assessment and/or questions. NSG updated, agreed. MD updated. Recommend Dietician f/u for support. GI f/u for any further education/information needs on pt's HIATAL HERNIA. SLP Visit Diagnosis: Dysphagia, unspecified (R13.10) (MOD-LARGE HIATAL HERNIA; Dementia baseline)    Aspiration Risk  Mild aspiration risk;Risk for inadequate nutrition/hydration (from REFLUX activity)    Diet Recommendation    (TBD) post surgery  Medication Administration: Whole meds with puree (to aid clearing)    Other  Recommendations Recommended Consults: Consider GI evaluation;Consider esophageal assessment (for education as needed) Oral Care Recommendations: Oral care BID;Oral care before and after PO;Staff/trained caregiver to provide oral care (Denture care)    Recommendations for follow up therapy are one component of a multi-disciplinary discharge planning process, led by the attending physician.  Recommendations may be updated based on patient status, additional functional criteria and insurance authorization.  Follow up Recommendations No SLP follow up      Assistance Recommended at Discharge  FULL currently  Functional Status Assessment Patient has not had a recent decline in their functional status  Frequency and Duration  (n/a)   (n/a)       Prognosis Prognosis for improved oropharyngeal function: Good Barriers to Reach Goals: Cognitive deficits;Time post onset;Severity  of deficits Barriers/Prognosis Comment: MOD-LARGE HIATAL HERNIA; Dementia baseline      Swallow Study   General Date of Onset: 06/19/23 HPI: Pt is a 89 y.o. female with medical history significant of Dementia, Larger Hiatal Hernia, COPD Gold stage I, mitral regurgitation, CKD stage IIIa, non-Hodgkin's lymphoma in remission, HTN, hypothyroidism, left UPJ obstruction status post stenting recently, sent from nursing home for new onset of cough wheezing shortness of breath after coughing up Phlegm.  She is HOH w/ HAs.  At this admit, she was attempting to walk w/ a new rollator and Fell backwards, hitting her head and had new onset of back pain.  Kyphoplasty procedure planned for today per NSG.   Chest Imaging: No active disease. Similar noted in CXRs of past 1+ years.  CT Imaging: Stomach/Bowel: Moderate to Large hiatal hernia, greater than 50% of  the Stomach is intrathoracic. Type of Study: Bedside Swallow Evaluation Previous Swallow Assessment: none Diet Prior to this Study: NPO (regular diet prior) Temperature Spikes Noted: No (  wbc 10.4) Respiratory Status: Nasal cannula (1-2L) History of Recent Intubation: No Behavior/Cognition: Alert;Cooperative;Pleasant mood;Confused;Distractible;Requires cueing Oral Cavity Assessment: Within Functional Limits Oral Care Completed by SLP: Recent completion by staff Oral Cavity - Dentition: Dentures, top Vision: Functional for self-feeding Self-Feeding Abilities: Able to feed self;Needs assist;Needs set up Patient Positioning:  (resting in bed) Baseline Vocal Quality: Normal    Oral/Motor/Sensory Function Overall Oral Motor/Sensory Function: Within functional limits (during speech)   Ice Chips Ice chips: Not tested   Thin Liquid Thin Liquid: Not tested    Nectar Thick Nectar Thick Liquid: Not tested   Honey Thick Honey Thick Liquid: Not tested   Puree Puree: Not tested   Solid     Solid: Not tested        Darla Edward, MS, CCC-SLP Speech  Language Pathologist Rehab Services; [facility] [place] [phone] (ascom) [place] 06/24/2023,2:19 PM

## 2023-06-24 NOTE — Progress Notes (Addendum)
 PT Cancellation Note  Patient Details Name: Monica  [name] MRN: [id number] DOB: 06/10/1928   Cancelled Treatment:    Reason Eval/Treat Not Completed: Patient at procedure or test/unavailable (Patient off the floor for procedure. PT will follow up as appropriate)  Addendum: Patient now on bed rest x 3 hours following procedure per orders. Will continue with attempts as appropriate.    Erlene Hawks 06/24/2023, 10:07 AM

## 2023-06-24 NOTE — Assessment & Plan Note (Signed)
 Patient had a few bowel movements this morning.

## 2023-06-24 NOTE — Progress Notes (Signed)
 Progress Note   Patient: Monica Singh  ARION MORGAN UJW:[id number] DOB: 10/13/1928 DOA: 06/18/2023     3 DOS: the patient was seen and examined on 06/24/2023   Brief hospital course: 89 y.o. female with medical history significant for COPD , diastolic CHF, CKD stage IIIa, dementia, non-Hodgkin's lymphoma in remission, HTN, hypothyroidism, left UPJ obstruction status post stent on 5/2, and discharged 2 days ago from hospitalization from 5/17 to 06/17/2023 for COPD/CHF exacerbation and E. coli UTI, still on antibiotics who presents from her assisted memory care unit following a fall while attempting to ambulate with her new rollator.  She fell backwards, hitting her head and had new onset of back pain.  Daughter at bedside contributes to history. ED course and data review: Tachycardic to about 103 with otherwise unremarkable vitals Labs notable for mild leukocytosis Of 13,600 slightly up from 11.3 on 5/20 Creatinine 1.12 up from 0.74 Potassium 5.4 Troponin 7 EKG, personally viewed and interpreted showing sinus at 99 with nonspecific T wave changes Trauma imaging included CT head, C-spine, L-spine and hip, as well as chest x-ray-which was significant for a new superior L1 endplate  fracture and a stable left adnexal cyst of 7.3 cm   Patient was treated with fentanyl  for pain Given a 500 cc NS bolus Fitted with a TLSO brace, but was unable to ambulate with the brace so admission requested   5/22.  Family would like to do conservative management with pain control physical therapy and Occupational Therapy evaluations. 5/23.  Patient having a lot of pain and not doing much with physical therapy.  Consulted interventional radiology for kyphoplasty. 5/24.  Family concerned that patient is sleeping a lot.  Will get rid of IV pain medication. 5/25.  Patient not having pain if she does not move.  Patient awake this morning.  Still has upper airway congestion. 5/26.  Foley catheter placed for urinary retention.   Patient still having upper airway congestion will add Mucomyst nebulizers.  Assessment and Plan: * Compression fracture of T11 vertebra (HCC) Ground-level fall while using rollator History of kyphoplasty 2019 Patient still in a lot of pain this morning.  Need Tylenol  around-the-clock dosing.  As needed oxycodone .  Discontinued IV pain medication on 5/24. Continue TLSO brace with working with physical therapy. PT OT recommending rehab and this will need to be approved by insurance company.  Currently at long-term care patient. Miacalcin nasal spray. Interventional radiology performed kyphoplasty on 5/27.   Acute metabolic encephalopathy History of dementia.  Mental status better last 3 days.  CT scan of the head negative.  AKI (acute kidney injury) (HCC) Creatinine 1.12 up from baseline of 0.74 Repeat creatinine down to 0.80  E-coli UTI Cefadroxil completed  Diastolic CHF, chronic (HCC) Clinically euvolemic to dry.  EF 60%   Hyponatremia Sodium 129.  Continue regular diet  COPD (chronic obstructive pulmonary disease) (HCC) Not acutely exacerbated, though had recent exacerbation.  Patient has upper airway congestion.  Continue standing dose nebulizers and inhalers.  Continue incentive spirometry and flutter valve.  Taper prednisone .  Add Mucomyst nebulizers.  Essential hypertension Blood pressure and heart rate better.  Discontinued Cardizem CD.  Blood pressure and heart rate likely elevated with pain.  Adnexal mass 5.3 cm left adnexal mass.  Present since 2020.  Constipation Patient had a few bowel movements this morning.  Acute urinary retention Patient had In-N-Out catheterizations over the last 24 hours.  Foley catheter placed on 5/26.  Now that kyphoplasty done hopefully she will move around  better and be able to urinate better.  Constipation treated.  Will try a voiding trial tomorrow.  Thrush Continue Diflucan   Overweight (BMI 25.0-29.9) BMI  26.21  Nephrolithiasis History of cystoscopy with ureteral stent placement on 05/30/2023   Advanced dementia [facility]) Delirium precautions  Lymphoma of small bowel (HCC) No acute issues suspected        Subjective: Patient seen this morning prior to kyphoplasty.  Patient still having quite a bit of back pain.  Patient had a few bowel movements this morning.  Upper airway congestion better today after starting Mucomyst yesterday.  Admitted with severe back pain found to have compression fracture.  Physical Exam: Vitals:   06/24/23 1300 06/24/23 1315 06/24/23 1330 06/24/23 1345  BP: (!) 99/54 (!) 103/54 (!) 101/53 (!) 95/57  Pulse: 88 85 84 83  Resp: 19 19 18 18   Temp:      TempSrc:      SpO2: 92% 94% 94% 94%  Weight:      Height:       Physical Exam HENT:     Head: Normocephalic.     Mouth/Throat:     Comments: Thrush Eyes:     General: Lids are normal.     Conjunctiva/sclera: Conjunctivae normal.  Cardiovascular:     Rate and Rhythm: Normal rate and regular rhythm.     Heart sounds: Normal heart sounds, S1 normal and S2 normal.  Pulmonary:     Breath sounds: Transmitted upper airway sounds present. Examination of the right-lower field reveals decreased breath sounds. Examination of the left-lower field reveals decreased breath sounds. Decreased breath sounds present. No wheezing or rhonchi.     Comments: Upper airway congestion improved from yesterday. Abdominal:     Palpations: Abdomen is soft.     Tenderness: There is no abdominal tenderness.  Musculoskeletal:     Right lower leg: No swelling.     Left lower leg: No swelling.  Skin:    General: Skin is warm.     Findings: No rash.  Neurological:     Mental Status: She is alert.     Data Reviewed: Sodium 129, creatinine 0.8, white blood count 10.4, hemoglobin 10.7, platelet count 345  Family Communication: Bedside  Disposition: Status is: Inpatient Remains inpatient appropriate because: PT recommending  rehab will need insurance authorization for that.  Currently from long-term care.  Had kyphoplasty today.  PT and OT reevaluation after procedure.  Planned Discharge Destination: Rehab    Time spent: 28 minutes  Author: Verla Glaze, MD 06/24/2023 1:51 PM  For on call review [email or web address].

## 2023-06-24 NOTE — Procedures (Signed)
 Interventional Radiology Procedure Note  Procedure: Successful T11 KP.   Complications: None  Estimated Blood Loss: None  Recommendations: - Bedrest x 1 hr   Signed,  Roxie Cord, MD

## 2023-06-24 NOTE — Care Management Important Message (Signed)
 Important Message  Patient Details  Name: Monica Singh  TERENCE GOOGE MRN: [id number] Date of Birth: 26-May-1928   Important Message Given:  Yes - Medicare IM     Delta Deshmukh W, CMA 06/24/2023, 12:23 PM

## 2023-06-24 NOTE — Progress Notes (Signed)
 OT Cancellation Note  Patient Details Name: Monica  SHANTARA Singh MRN: [id number] DOB: July 25, 1928   Cancelled Treatment:    Reason Eval/Treat Not Completed: Patient at procedure or test/ unavailable. Chart reviewed. Pt currently off floor to IR for planned kyphoplasty. Will hold and re-attempt as pt appropriate.   Gordan Latina, M.S. OTR/L  06/24/23, 10:06 AM  ascom [phone]

## 2023-06-24 NOTE — Consult Note (Signed)
 MEDICATION-RELATED CONSULT NOTE   IR Procedure Consult - Anticoagulant/Antiplatelet PTA/Inpatient Med List Review by Pharmacist    Procedure: T11 KP     Completed: 5/27 1301  Post-Procedural bleeding risk per IR MD assessment:    Antithrombotic medications on inpatient or PTA profile prior to procedure:   enoxaparin  40 mg daily.     Recommended restart time per IR Post-Procedure Guidelines:  Next AM.    Other considerations:      Plan:     Enoxaparin  40 mg daily 5/28 AM.

## 2023-06-25 DIAGNOSIS — E871 Hypo-osmolality and hyponatremia: Secondary | ICD-10-CM | POA: Diagnosis not present

## 2023-06-25 DIAGNOSIS — S22080A Wedge compression fracture of T11-T12 vertebra, initial encounter for closed fracture: Secondary | ICD-10-CM | POA: Diagnosis not present

## 2023-06-25 LAB — MRSA NEXT GEN BY PCR, NASAL: MRSA by PCR Next Gen: NOT DETECTED

## 2023-06-25 MED ORDER — SODIUM CHLORIDE 0.9 % IV SOLN: INTRAVENOUS | Status: AC

## 2023-06-25 NOTE — Plan of Care (Signed)

## 2023-06-25 NOTE — TOC Progression Note (Signed)
 Transition of Care [facility]) - Progression Note    Patient Details  Name: Monica Singh MRN: [id number] Date of Birth: October 28, 1928  [facility] CM/SW Contact  Alexandra Ice, RN Phone Number: 06/25/2023, 3:53 PM  Clinical Narrative:    Patient has pending auth pending [id number]#[id number]  for [employer].        Expected Discharge Plan and Services                                               Social Determinants of Health (SDOH) Interventions SDOH Screenings   Food Insecurity: No Food Insecurity (06/19/2023)  Housing: Low Risk  (06/19/2023)  Transportation Needs: No Transportation Needs (06/19/2023)  Utilities: Not At Risk (06/19/2023)  Alcohol Screen: Low Risk  (08/19/2022)  Depression (PHQ2-9): Low Risk  (08/19/2022)  Financial Resource Strain: Low Risk  (08/19/2022)  Physical Activity: Insufficiently Active (08/19/2022)  Social Connections: Socially Isolated (06/19/2023)  Stress: No Stress Concern Present (08/19/2022)  Tobacco Use: Medium Risk (06/18/2023)  Health Literacy: Inadequate Health Literacy (08/19/2022)    Readmission Risk Interventions     No data to display

## 2023-06-25 NOTE — Progress Notes (Signed)
 Occupational Therapy Treatment Patient Details Name: Monica Singh MRN: [id number] DOB: 1928-07-13 Today's Date: 06/25/2023   History of present illness Pt is a 89 y.o. female who presents from memory care unit s/p fall while attempting to ambulate with her new rollator. Admitted for management of  L1 vertebral fracture, AKI, UTI. TLSO when OOB. PMH of COPD , diastolic CHF, CKD stage IIIa, dementia, non-Hodgkin's lymphoma in remission, HTN, hypothyroidism, left UPJ obstruction s.p stent on 5/2, and discharged 2 days ago from hospitalization from 5/17-5/20 for COPD/CHF exacerbation and E. coli UTI. S/p kyphoplasty 06/24/23.   OT comments  Monica Singh seen for OT treatment on this date. Upon arrival to room pt seated in bed, agreeable to tx. Pt requires MAX A for bed mobility, MAX A for donning TLSO seated EOB, and MAX A STS x 2 at EOB. Pt t/f from bed>chair with MIN A +2 + RW. Pt reported dizziness when sitting and standing EOB, closed eyes and appeared to fall asleep - family reports mild lethargy since admission. See BP measurements below. Pt making good progress toward goals, will continue to follow POC. Discharge recommendation remains appropriate.        If plan is discharge home, recommend the following:  A lot of help with walking and/or transfers;A lot of help with bathing/dressing/bathroom   Equipment Recommendations  Other (comment)    Recommendations for Other Services      Precautions / Restrictions Precautions Precautions: Fall;Back Recall of Precautions/Restrictions: Impaired Required Braces or Orthoses: Other Brace Other Brace: TLSO when OOB/ambulating Restrictions Weight Bearing Restrictions Per Provider Order: No       Mobility Bed Mobility Overal bed mobility: Needs Assistance Bed Mobility: Supine to Sit     Supine to sit: Max assist, HOB elevated, Used rails          Transfers Overall transfer level: Needs assistance Equipment used: Rolling walker (2  wheels) Transfers: Sit to/from Stand Sit to Stand: Max assist                 Balance Overall balance assessment: Needs assistance Sitting-balance support: Feet supported, Single extremity supported Sitting balance-Leahy Scale: Fair     Standing balance support: Bilateral upper extremity supported, Reliant on assistive device for balance Standing balance-Leahy Scale: Poor                             ADL either performed or assessed with clinical judgement   ADL Overall ADL's : Needs assistance/impaired                                       General ADL Comments: MAX A for donning TLSO seated EOB    Extremity/Trunk Assessment Upper Extremity Assessment Upper Extremity Assessment: Generalized weakness   Lower Extremity Assessment Lower Extremity Assessment: Generalized weakness   Cervical / Trunk Assessment Cervical / Trunk Exceptions: L1 compression fx to wear TLSO when OOB    Vision       Perception     Praxis     Communication Communication Communication: Impaired Factors Affecting Communication: Hearing impaired   Cognition Arousal: Alert Behavior During Therapy: Flat affect Cognition: History of cognitive impairments                               Following commands:  Impaired Following commands impaired: Follows one step commands with increased time, Follows one step commands inconsistently      Cueing   Cueing Techniques: Verbal cues, Gestural cues, Tactile cues  Exercises      Shoulder Instructions       General Comments BP sitting in bed 110/64, EOB 113/58, Standing 117/43, HR 98    Pertinent Vitals/ Pain       Pain Assessment Pain Assessment: PAINAD Breathing: normal Negative Vocalization: occasional moan/groan, low speech, negative/disapproving quality Facial Expression: smiling or inexpressive Body Language: relaxed Consolability: no need to console PAINAD Score: 1 Pain Location: back Pain  Descriptors / Indicators: Discomfort, Guarding, Grimacing Pain Intervention(s): Limited activity within patient's tolerance, Monitored during session, Repositioned  Home Living Family/patient expects to be discharged to:: Skilled nursing facility                                 Additional Comments: [facility]      Prior Functioning/Environment              Frequency  Min 2X/week        Progress Toward Goals  OT Goals(current goals can now be found in the care plan section)  Progress towards OT goals: Progressing toward goals  Acute Rehab OT Goals Patient Stated Goal: to go home OT Goal Formulation: With patient/family Time For Goal Achievement: 07/09/23 Potential to Achieve Goals: Good ADL Goals Pt Will Perform Grooming: sitting;with set-up Pt Will Perform Upper Body Dressing: with set-up;sitting Pt Will Transfer to Toilet: with min assist;bedside commode;stand pivot transfer;ambulating  Plan      Co-evaluation                 AM-PAC OT "6 Clicks" Daily Activity     Outcome Measure   Help from another person eating meals?: A Little Help from another person taking care of personal grooming?: A Little Help from another person toileting, which includes using toliet, bedpan, or urinal?: A Lot Help from another person bathing (including washing, rinsing, drying)?: A Lot Help from another person to put on and taking off regular upper body clothing?: A Lot Help from another person to put on and taking off regular lower body clothing?: A Lot 6 Click Score: 14    End of Session Equipment Utilized During Treatment: Rolling walker (2 wheels)  OT Visit Diagnosis: Other abnormalities of gait and mobility (R26.89);Unsteadiness on feet (R26.81);Muscle weakness (generalized) (M62.81);History of falling (Z91.81)   Activity Tolerance Patient tolerated treatment well;Patient limited by lethargy   Patient Left in chair;with call bell/phone within  reach;with chair alarm set;with family/visitor present   Nurse Communication          Time: 1610-9604 OT Time Calculation (min): 30 min  Charges: OT General Charges $OT Visit: 1 Visit OT Treatments $Self Care/Home Management : 23-37 mins  Stevenson Elbe, Student OT   [employer] 06/25/2023, 12:48 PM

## 2023-06-25 NOTE — Plan of Care (Signed)
 Patient had no acute events overnight.  Remains A/Ox2, with intermittent confusion, RN rounded on patient q2hours per protocol, safety protocol remains in place.  Problem: Education: Goal: Knowledge of General Education information will improve Description: Including pain rating scale, medication(s)/side effects and non-pharmacologic comfort measures 06/25/2023 0523 by Madalyn Scarce, RN Outcome: Progressing 06/25/2023 0522 by Madalyn Scarce, RN Outcome: Progressing   Problem: Health Behavior/Discharge Planning: Goal: Ability to manage health-related needs will improve 06/25/2023 0523 by Madalyn Scarce, RN Outcome: Progressing 06/25/2023 0522 by Madalyn Scarce, RN Outcome: Progressing   Problem: Clinical Measurements: Goal: Ability to maintain clinical measurements within normal limits will improve 06/25/2023 0523 by Madalyn Scarce, RN Outcome: Progressing 06/25/2023 0522 by Madalyn Scarce, RN Outcome: Progressing Goal: Will remain free from infection 06/25/2023 0523 by Madalyn Scarce, RN Outcome: Progressing 06/25/2023 0522 by Madalyn Scarce, RN Outcome: Progressing Goal: Diagnostic test results will improve 06/25/2023 0523 by Madalyn Scarce, RN Outcome: Progressing 06/25/2023 0522 by Madalyn Scarce, RN Outcome: Progressing Goal: Respiratory complications will improve 06/25/2023 0523 by Madalyn Scarce, RN Outcome: Progressing 06/25/2023 0522 by Madalyn Scarce, RN Outcome: Progressing Goal: Cardiovascular complication will be avoided 06/25/2023 0523 by Madalyn Scarce, RN Outcome: Progressing 06/25/2023 0522 by Madalyn Scarce, RN Outcome: Progressing   Problem: Activity: Goal: Risk for activity intolerance will decrease 06/25/2023 0523 by Madalyn Scarce, RN Outcome: Progressing 06/25/2023 0522 by Madalyn Scarce, RN Outcome: Progressing   Problem: Nutrition: Goal: Adequate nutrition will be maintained 06/25/2023 0523 by Madalyn Scarce, RN Outcome: Progressing 06/25/2023 0522 by Madalyn Scarce, RN Outcome: Progressing   Problem: Coping: Goal: Level of anxiety will decrease 06/25/2023 0523 by Madalyn Scarce, RN Outcome: Progressing 06/25/2023 0522 by Madalyn Scarce, RN Outcome: Progressing   Problem: Elimination: Goal: Will not experience complications related to bowel motility 06/25/2023 0523 by Madalyn Scarce, RN Outcome: Progressing 06/25/2023 0522 by Madalyn Scarce, RN Outcome: Progressing Goal: Will not experience complications related to urinary retention 06/25/2023 0523 by Madalyn Scarce, RN Outcome: Progressing 06/25/2023 0522 by Madalyn Scarce, RN Outcome: Progressing   Problem: Pain Managment: Goal: General experience of comfort will improve and/or be controlled 06/25/2023 0523 by Madalyn Scarce, RN Outcome: Progressing 06/25/2023 0522 by Madalyn Scarce, RN Outcome: Progressing   Problem: Safety: Goal: Ability to remain free from injury will improve 06/25/2023 0523 by Madalyn Scarce, RN Outcome: Progressing 06/25/2023 0522 by Madalyn Scarce, RN Outcome: Progressing   Problem: Skin Integrity: Goal: Risk for impaired skin integrity will decrease 06/25/2023 0523 by Madalyn Scarce, RN Outcome: Progressing 06/25/2023 0522 by Madalyn Scarce, RN Outcome: Progressing

## 2023-06-25 NOTE — Evaluation (Signed)
 Physical Therapy Re-Evaluation Patient Details Name: Monica Singh MRN: [id number] DOB: 07-19-1928 Today's Date: 06/25/2023  History of Present Illness  Pt is a 89 y.o. female who presents from memory care unit s/p fall while attempting to ambulate with her new rollator. Admitted for management of  L1 vertebral fracture, AKI, UTI. TLSO when OOB. PMH of COPD , diastolic CHF, CKD stage IIIa, dementia, non-Hodgkin's lymphoma in remission, HTN, hypothyroidism, left UPJ obstruction s.p stent on 5/2, and discharged 2 days ago from hospitalization from 5/17-5/20 for COPD/CHF exacerbation and E. coli UTI. S/p kyphoplasty 06/24/23.   Clinical Impression  Pt received upright in recliner with daughter present. Agreeable to PT Re-eval due to having kyphoplasty yesterday. Reviewed with daughter and pt donning TLSO in sitting. Regular CGA and VC's needed for sitting unsupported. X2 standing efforts with need for maxA+1 and multi modal cuing for anterior trunk lean. Regular minA needed on RW ambulating ~10' to [facility]. Pt suddenly becomes less responsive, keeps closing eyes and appears like she is falling asleep or about to have an orthostatic event. Pt quickly returned to standing ambulating 5' back to EOB needing regular multimodal cuing for RW and body sequencing with steps and body positioning within RW and maxA to total A+1 to return to supine and scooted up in bed. Unfortunately unable to assess likelihood of orthostatics but BP was 105/65 mm Hg, SPO2: 98-100% on RA, and HR 106-108 BPM. Semi reclined pt remains responsive, communicating appropriately at her baseline with NSG in room updated at bedside. Pt remains appropriate for initial d/c recs.      If plan is discharge home, recommend the following: A lot of help with bathing/dressing/bathroom;A little help with walking and/or transfers;Assist for transportation;Direct supervision/assist for medications management;Assistance with cooking/housework;Help with  stairs or ramp for entrance;Direct supervision/assist for financial management   Can travel by private vehicle   Yes    Equipment Recommendations None recommended by PT  Recommendations for Other Services       Functional Status Assessment Patient has had a recent decline in their functional status and demonstrates the ability to make significant improvements in function in a reasonable and predictable amount of time.     Precautions / Restrictions Precautions Precautions: Fall;Back Recall of Precautions/Restrictions: Impaired Precaution/Restrictions Comments: TLSO walking Required Braces or Orthoses: Other Brace Other Brace: TLSO when OOB/ambulating Restrictions Weight Bearing Restrictions Per Provider Order: No      Mobility  Bed Mobility Overal bed mobility: Needs Assistance Bed Mobility: Sit to Supine       Sit to supine: Max assist   General bed mobility comments: unable to follow verbal cues or hand over hand cues to initiate log roll Patient Response: Cooperative, Flat affect  Transfers Overall transfer level: Needs assistance Equipment used: Rolling walker (2 wheels) Transfers: Sit to/from Stand Sit to Stand: Max assist           General transfer comment: from recliner and BSC. TC's on torso for anterior trunk lean    Ambulation/Gait Ambulation/Gait assistance: Contact guard assist Gait Distance (Feet): 15 Feet (10' from recliner to [place] [facility]. 5' from [facility] to bed.) Assistive device: Rolling walker (2 wheels) Gait Pattern/deviations: Step-to pattern       General Gait Details: very slow, minA on RW for sequencing  Stairs            Wheelchair Mobility     Tilt Bed Tilt Bed Patient Response: Cooperative, Flat affect  Modified Rankin (Stroke Patients Only)  Balance Overall balance assessment: Needs assistance Sitting-balance support: Feet supported, Bilateral upper extremity supported Sitting balance-Leahy Scale: Poor Sitting  balance - Comments: reliant on back support for supervision to sit EOB. Postural control: Posterior lean Standing balance support: Reliant on assistive device for balance, Bilateral upper extremity supported Standing balance-Leahy Scale: Poor                               Pertinent Vitals/Pain Pain Assessment Pain Assessment: Faces Faces Pain Scale: Hurts a little bit Pain Location: back Pain Descriptors / Indicators: Discomfort, Guarding, Grimacing Pain Intervention(s): Monitored during session, Repositioned    Home Living Family/patient expects to be discharged to:: Skilled nursing facility                   Additional Comments: [facility]    Prior Function Prior Level of Function : Needs assist  Cognitive Assist : Mobility (cognitive);ADLs (cognitive)     Physical Assist : Mobility (physical);ADLs (physical)     Mobility Comments: Pt ambulatory with RW, short distances only due to baseline dyspnea; supervision at all times?? ADLs Comments: has assist at SNF for all ADLs     Extremity/Trunk Assessment   Upper Extremity Assessment Upper Extremity Assessment: Generalized weakness    Lower Extremity Assessment Lower Extremity Assessment: Generalized weakness    Cervical / Trunk Assessment Cervical / Trunk Exceptions: L1 compression fx to wear TLSO when OOB  Communication        Cognition Arousal: Alert, Lethargic Behavior During Therapy: Flat affect   PT - Cognitive impairments: History of cognitive impairments, Difficult to assess                       PT - Cognition Comments: becomes more lethargic as session goes on like she is about to have an orthostatic event. NSG aware. Following commands: Impaired Following commands impaired: Follows one step commands with increased time, Follows one step commands inconsistently     Cueing Cueing Techniques: Verbal cues, Gestural cues, Tactile cues, Visual cues     General  Comments General comments (skin integrity, edema, etc.): BP: 105/64 mm Hg (78), HR: 106 BPM, SPO2: 98-100%    Exercises Other Exercises Other Exercises: reviewed donning TLSO sitting in recliner with Pt and daughter   Assessment/Plan    PT Assessment Patient needs continued PT services  PT Problem List Decreased strength;Decreased range of motion;Decreased activity tolerance;Decreased balance;Decreased mobility;Decreased cognition;Decreased knowledge of use of DME;Decreased safety awareness       PT Treatment Interventions DME instruction;Gait training;Stair training;Functional mobility training;Therapeutic activities;Therapeutic exercise;Balance training;Cognitive remediation;Patient/family education    PT Goals (Current goals can be found in the Care Plan section)  Acute Rehab PT Goals Patient Stated Goal: regain strength and mobility PT Goal Formulation: With patient Time For Goal Achievement: 07/09/23 Potential to Achieve Goals: Good    Frequency Min 2X/week     Co-evaluation               AM-PAC PT "6 Clicks" Mobility  Outcome Measure Help needed turning from your back to your side while in a flat bed without using bedrails?: Total Help needed moving from lying on your back to sitting on the side of a flat bed without using bedrails?: Total Help needed moving to and from a bed to a chair (including a wheelchair)?: A Lot Help needed standing up from a chair using your arms (e.g., wheelchair  or bedside chair)?: A Lot Help needed to walk in hospital room?: A Lot Help needed climbing 3-5 steps with a railing? : A Lot 6 Click Score: 10    End of Session Equipment Utilized During Treatment: Gait belt;Back brace Activity Tolerance: Patient limited by lethargy Patient left: with family/visitor present;with call bell/phone within reach;in bed;with nursing/sitter in room Nurse Communication: Mobility status PT Visit Diagnosis: Difficulty in walking, not elsewhere  classified (R26.2);Other abnormalities of gait and mobility (R26.89);Muscle weakness (generalized) (M62.81);Other symptoms and signs involving the nervous system (R29.898)    Time: 7829-5621 PT Time Calculation (min) (ACUTE ONLY): 27 min   Charges:   PT Evaluation $PT Re-evaluation: 1 Re-eval PT Treatments $Therapeutic Activity: 8-22 mins PT General Charges $$ ACUTE PT VISIT: 1 Visit       Marc Senior. Fairly IV, PT, DPT Physical Therapist- [facility]  06/25/2023, 11:00 AM

## 2023-06-25 NOTE — Progress Notes (Signed)
 Patient has not yet voided. Bladder scan shows 298 ml, MD notified.

## 2023-06-25 NOTE — TOC Progression Note (Signed)
 Transition of Care [facility]) - Progression Note    Patient Details  Name: Monica Singh MRN: [id number] Date of Birth: 01-13-1929  [facility]) CM/SW Contact  Alexandra Ice, RN Phone Number: 06/25/2023, 2:50 PM  Clinical Narrative:    Contacted daughter to discussed discharge plan. Confirmed patient will return to [facility] under skilled for additional rehab. She stated patient is still waiting to void. TOC messaged MD, stated patient will be ready to DC in 1-2 days. Will initiate auth.         Expected Discharge Plan and Services                                               Social Determinants of Health (SDOH) Interventions SDOH Screenings   Food Insecurity: No Food Insecurity (06/19/2023)  Housing: Low Risk  (06/19/2023)  Transportation Needs: No Transportation Needs (06/19/2023)  Utilities: Not At Risk (06/19/2023)  Alcohol Screen: Low Risk  (08/19/2022)  Depression (PHQ2-9): Low Risk  (08/19/2022)  Financial Resource Strain: Low Risk  (08/19/2022)  Physical Activity: Insufficiently Active (08/19/2022)  Social Connections: Socially Isolated (06/19/2023)  Stress: No Stress Concern Present (08/19/2022)  Tobacco Use: Medium Risk (06/18/2023)  Health Literacy: Inadequate Health Literacy (08/19/2022)    Readmission Risk Interventions     No data to display

## 2023-06-25 NOTE — Progress Notes (Signed)
 Speech Language Pathology Treatment: Dysphagia  Patient Details Name: Monica Singh MRN: [id number] DOB: [birth year] Today's Date: 06/25/2023 Time: 5621-3086 SLP Time Calculation (min) (ACUTE ONLY): 35 min  Assessment / Plan / Recommendation Clinical Impression  Pt seen for ongoing assessment of swallowing this morning; toleration of diet. Daughter present. Education completed w/ both.  HPI HPI: Pt is a 89 y.o. female with medical history significant of Dementia, Larger Hiatal Hernia, COPD Gold stage I, mitral regurgitation, CKD stage IIIa, non-Hodgkin's lymphoma in remission, HTN, hypothyroidism, left UPJ obstruction status post stenting recently, sent from nursing home for new onset of cough wheezing shortness of breath after coughing up Phlegm.  She is HOH w/ HAs.  At this admit, she was attempting to walk w/ a new rollator and Fell backwards, hitting her head and had new onset of back pain.  Kyphoplasty procedure planned for today per NSG.   Chest Imaging: No active disease. Similar noted in CXRs of past 1+ years.  CT Imaging: Stomach/Bowel: Moderate to Large hiatal hernia, greater than 50% of  the Stomach is intrathoracic.      SLP Plan  All goals met      Recommendations for follow up therapy are one component of a multi-disciplinary discharge planning process, led by the attending physician.  Recommendations may be updated based on patient status, additional functional criteria and insurance authorization.    Recommendations  Diet recommendations: Regular;Thin liquid (cut/chopped meats, gravies) Liquids provided via: Cup (less straw d/t air swallowed) Medication Administration: Whole meds with puree (to aid clearing) Supervision: Patient able to self feed;Intermittent supervision to cue for compensatory strategies (support) Compensations: Minimize environmental distractions;Slow rate;Small sips/bites;Lingual sweep for clearance of pocketing;Multiple dry swallows after each  bite/sip;Follow solids with liquid Postural Changes and/or Swallow Maneuvers: Out of bed for meals;Seated upright 90 degrees;Upright 30-60 min after meal (REFLUX precs.)                 (Dietician; GI for ed re: Large Hiatal Hernia) Oral care BID;Oral care before and after PO;Staff/trained caregiver to provide oral care (denture care)   Frequent or constant Supervision/Assistance (Dementia) Dysphagia, unspecified (R13.10) (MOD-LARGE HIATAL HERNIA; Dementia baseline; Reflux activity)     All goals met        Darla Edward, MS, CCC-SLP Speech Language Pathologist Rehab Services; [facility] [phone] (ascom) Takasha Vetere  06/25/2023, 4:02 PM

## 2023-06-25 NOTE — Progress Notes (Signed)
 Progress Note   Patient: Monica Singh GEX:[id number] DOB: 1928/01/31 DOA: 06/18/2023     4 DOS: the patient was seen and examined on 06/25/2023   Brief hospital course:  "89 y.o. female with medical history significant for COPD , diastolic CHF, CKD stage IIIa, dementia, non-Hodgkin's lymphoma in remission, HTN, hypothyroidism, left UPJ obstruction status post stent on 5/2, and discharged 2 days ago from hospitalization from 5/17 to 06/17/2023 for COPD/CHF exacerbation and E. coli UTI, still on antibiotics who presents from her assisted memory care unit following a fall while attempting to ambulate with her new rollator.  She fell backwards, hitting her head and had new onset of back pain.  Daughter at bedside contributes to history. ED course and data review: Tachycardic to about 103 with otherwise unremarkable vitals Labs notable for mild leukocytosis Of 13,600 slightly up from 11.3 on 5/20 Creatinine 1.12 up from 0.74 Potassium 5.4 Troponin 7 EKG, personally viewed and interpreted showing sinus at 99 with nonspecific T wave changes Trauma imaging included CT head, C-spine, L-spine and hip, as well as chest x-ray-which was significant for a new superior L1 endplate  fracture and a stable left adnexal cyst of 7.3 cm   Patient was treated with fentanyl  for pain Given a 500 cc NS bolus Fitted with a TLSO brace, but was unable to ambulate with the brace so admission requested    5/22.  Family would like to do conservative management with pain control physical therapy and Occupational Therapy evaluations. 5/23.  Patient having a lot of pain and not doing much with physical therapy.  Consulted interventional radiology for kyphoplasty. 5/24.  Family concerned that patient is sleeping a lot.  Will get rid of IV pain medication. 5/25.  Patient not having pain if she does not move.  Patient awake this morning.  Still has upper airway congestion. 5/26.  Foley catheter placed for urinary retention.   Patient still having upper airway congestion will add Mucomyst nebulizers."   I assumed care on 06/25/23.  5/28 -- foley removed for voiding trial.  Back pain much improved after Kyphoplasty.   Assessment and Plan:  * Compression fracture of T11 vertebra Ground-level fall while using rollator History of kyphoplasty 2019 5/27 - IR performed Kyphoplasty 5/28 - pt notes pain essentially resolved after Kyphoplasty  --Pain control with scheduled Tylenol , PRN oxycodone  --Discontinued IV pain medication on 5/24. --Continue TLSO brace  --PT/OT -- SNF recommended.  Pt long term resident at [facility] following --Calcitonin nasal spray --Fall precautions    Acute metabolic encephalopathy - resolved. Mental status at baseline. History of dementia.    CT scan of the head negative. --Delirium precautions --Minimize sedating meds  AKI -- with Cr 1.12 up from baseline of 0.74, improved. --Monitor BMP   E-coli UTI --Cefadroxil completed   Diastolic CHF, chronic Clinically euvolemic to dry.  EF 60% --Monitor volume status     Hyponatremia - chronically appears Na levels 129--133 --Gentle IV fluids x 1 day given poor PO intake --Repeat BMP in AM   COPD  Not acutely exacerbated, though had recent exacerbation.   Noted to have upper airway congestion.   --Continue standing dose nebulizers and inhalers --Incentive spirometry and flutter valve --Taper prednisone  --Mucomyst nebulizers   Essential hypertension Blood pressure and heart rate likely elevated with pain. --Discontinued Cardizem CD    Adnexal mass 5.3 cm left adnexal mass.  Present since 2020.   Constipation Bowel regimen per orders   Acute urinary retention  Patient had In-out catheterizations.  Foley catheter placed on 5/26.   --Discontinue Foley this AM for voiding trial --Bladder scans to monitor   Thrush --Continue Diflucan    Overweight (BMI 25.0-29.9) BMI 26.21   Nephrolithiasis History of  cystoscopy with ureteral stent placement on 05/30/2023 No acute issues. Monitor.    Advanced dementia Delirium precautions   Lymphoma of small bowel No acute issues suspected      Subjective: Pt seen with two daughters at bedside this AM.  Pt working with PT.  Pt reports pain essentially resolved after procedure yesterday.  Taking only Tylenol .   Pt denies acute complaints.   Physical Exam: Vitals:   06/24/23 2003 06/24/23 2048 06/25/23 0451 06/25/23 0757  BP:  (!) 109/50 122/60 (!) 124/58  Pulse:  89 86 98  Resp:  18 16 16   Temp:  98 F (36.7 C) 97.9 F (36.6 C) 98.6 F (37 C)  TempSrc:    Oral  SpO2: 93% 93% 93% 92%  Weight:      Height:       General exam: awake, appears drowsy, no acute distress HEENT: moist mucus membranes, hearing grossly normal  Respiratory system: CTAB, no wheezes, rales or rhonchi, normal respiratory effort. Cardiovascular system: normal S1/S2, RRR,  no pedal edema.   Gastrointestinal system: soft, NT, ND Central nervous system: A&O x 2+. no gross focal neurologic deficits, normal speech Extremities: moves all  no edema, normal tone Skin: dry, intact, normal temperature Psychiatry: normal mood, congruent affect   Data Reviewed:  Notable labs -- Na 129 Hjbg 10.7  Family Communication: Two daughters at bedside on rounds  Disposition: Status is: Inpatient Remains inpatient appropriate because: awaiting d/c to SNF   Planned Discharge Destination: Skilled nursing facility    Time spent: 42 minutes  Author: Montey Apa, DO 06/25/2023 1:16 PM  For on call review [email or web address].

## 2023-06-26 DIAGNOSIS — S22080A Wedge compression fracture of T11-T12 vertebra, initial encounter for closed fracture: Secondary | ICD-10-CM | POA: Diagnosis not present

## 2023-06-26 LAB — BASIC METABOLIC PANEL WITH GFR
Anion gap: 7 (ref 5–15)
BUN: 22 mg/dL (ref 8–23)
CO2: 25 mmol/L (ref 22–32)
Calcium: 8.6 mg/dL — ABNORMAL LOW (ref 8.9–10.3)
Chloride: 100 mmol/L (ref 98–111)
Creatinine, Ser: 0.73 mg/dL (ref 0.44–1.00)
GFR, Estimated: >60 mL/min (ref >60)
Glucose, Bld: 112 mg/dL — ABNORMAL HIGH (ref 70–99)
Potassium: 3.8 mmol/L (ref 3.5–5.1)
Sodium: 132 mmol/L — ABNORMAL LOW (ref 135–145)

## 2023-06-26 MED ORDER — PREDNISONE 10 MG PO TABS: 10.0000 mg | ORAL_TABLET | Freq: Every day | ORAL | Status: DC

## 2023-06-26 MED ORDER — FLEET ENEMA RE ENEM: 1.0000 | ENEMA | Freq: Every day | RECTAL | Status: DC | PRN

## 2023-06-26 MED ORDER — TRAMADOL HCL 50 MG PO TABS: 50.0000 mg | ORAL_TABLET | Freq: Four times a day (QID) | ORAL | 0 refills | Status: DC | PRN

## 2023-06-26 MED ORDER — OXYCODONE HCL 5 MG PO TABS: 5.0000 mg | ORAL_TABLET | Freq: Four times a day (QID) | ORAL | 0 refills | Status: DC | PRN

## 2023-06-26 MED ORDER — FLUCONAZOLE 100 MG PO TABS: 100.0000 mg | ORAL_TABLET | Freq: Every day | ORAL | Status: AC

## 2023-06-26 MED ORDER — POLYETHYLENE GLYCOL 3350 17 G PO PACK: 17.0000 g | PACK | Freq: Two times a day (BID) | ORAL | Status: DC

## 2023-06-26 MED ORDER — ACETAMINOPHEN 325 MG PO TABS: 650.0000 mg | ORAL_TABLET | Freq: Four times a day (QID) | ORAL | Status: AC

## 2023-06-26 NOTE — Plan of Care (Signed)
  Problem: Education: Goal: Knowledge of General Education information will improve Description: Including pain rating scale, medication(s)/side effects and non-pharmacologic comfort measures Outcome: Adequate for Discharge   Problem: Health Behavior/Discharge Planning: Goal: Ability to manage health-related needs will improve Outcome: Adequate for Discharge   Problem: Clinical Measurements: Goal: Ability to maintain clinical measurements within normal limits will improve Outcome: Adequate for Discharge Goal: Will remain free from infection Outcome: Adequate for Discharge Goal: Diagnostic test results will improve Outcome: Adequate for Discharge Goal: Respiratory complications will improve 06/26/2023 1138 by Franki Isles, RN Outcome: Adequate for Discharge 06/26/2023 1053 by Franki Isles, RN Outcome: Progressing Goal: Cardiovascular complication will be avoided Outcome: Adequate for Discharge   Problem: Activity: Goal: Risk for activity intolerance will decrease 06/26/2023 1138 by Franki Isles, RN Outcome: Adequate for Discharge 06/26/2023 1053 by Franki Isles, RN Outcome: Progressing   Problem: Nutrition: Goal: Adequate nutrition will be maintained 06/26/2023 1138 by Franki Isles, RN Outcome: Adequate for Discharge 06/26/2023 1053 by Franki Isles, RN Outcome: Progressing   Problem: Coping: Goal: Level of anxiety will decrease Outcome: Adequate for Discharge   Problem: Elimination: Goal: Will not experience complications related to bowel motility Outcome: Adequate for Discharge Goal: Will not experience complications related to urinary retention Outcome: Adequate for Discharge   Problem: Pain Managment: Goal: General experience of comfort will improve and/or be controlled Outcome: Adequate for Discharge   Problem: Safety: Goal: Ability to remain free from injury will improve Outcome: Adequate for Discharge    Problem: Skin Integrity: Goal: Risk for impaired skin integrity will decrease Outcome: Adequate for Discharge   Problem: Acute Rehab OT Goals (only OT should resolve) Goal: Pt. Will Perform Grooming Outcome: Adequate for Discharge Goal: Pt. Will Perform Upper Body Dressing Outcome: Adequate for Discharge Goal: Pt. Will Transfer To Toilet Outcome: Adequate for Discharge   Problem: Acute Rehab PT Goals(only PT should resolve) Goal: Pt Will Ambulate Outcome: Adequate for Discharge

## 2023-06-26 NOTE — TOC Transition Note (Addendum)
 Transition of Care [facility]) - Discharge Note   Patient Details  Name: Monica Singh MRN: [id number] Date of Birth: [birth year]  Transition of Care [facility]) CM/SW Contact:  Crayton Docker, RN 06/26/2023, 10:23 AM   Clinical Narrative:     Alert from Dr. Antoniette Batty regarding patient medically ready to discharge to [facility] via [employer] at 1300. CM awaiting discharge summary fir SNF.   Discharge orders, discharge summary noted, faxed to [facility].   Final next level of care: Skilled Nursing Facility Barriers to Discharge: No Barriers Identified   Patient Goals and CMS Choice    SNF   Discharge Placement     SNF           Discharge Plan and Services Additional resources added to the After Visit Summary for        Social Drivers of Health (SDOH) Interventions SDOH Screenings   Food Insecurity: No Food Insecurity (06/19/2023)  Housing: Low Risk  (06/19/2023)  Transportation Needs: No Transportation Needs (06/19/2023)  Utilities: Not At Risk (06/19/2023)  Alcohol Screen: Low Risk  (08/19/2022)  Depression (PHQ2-9): Low Risk  (08/19/2022)  Financial Resource Strain: Low Risk  (08/19/2022)  Physical Activity: Insufficiently Active (08/19/2022)  Social Connections: Socially Isolated (06/19/2023)  Stress: No Stress Concern Present (08/19/2022)  Tobacco Use: Medium Risk (06/18/2023)  Health Literacy: Inadequate Health Literacy (08/19/2022)     Readmission Risk Interventions     No data to display

## 2023-06-26 NOTE — Plan of Care (Signed)
  Problem: Activity: Goal: Risk for activity intolerance will decrease Outcome: Not Progressing   Problem: Elimination: Goal: Will not experience complications related to urinary retention Outcome: Progressing   Problem: Pain Managment: Goal: General experience of comfort will improve and/or be controlled Outcome: Progressing

## 2023-06-26 NOTE — TOC Progression Note (Signed)
 Transition of Care [facility]) - Progression Note    Patient Details  Name: Monica Singh MRN: [id number] Date of Birth: 11-Mar-1928  [facility] Care [facility]) CM/SW Contact  Crayton Docker, RN 06/26/2023, 9:40 AM  Clinical Narrative:     Alert received from RN CM Jullie Oiler, regarding [employer] approval. CM secure message to Antony Baumgartner, Admissions, [facility] SNF regarding approved auth. CM alert to Dr. Antoniette Batty regarding [employer] approval for SNF and medical readiness.   Expected Discharge Plan and Services    SNF     Social Determinants of Health (SDOH) Interventions SDOH Screenings   Food Insecurity: No Food Insecurity (06/19/2023)  Housing: Low Risk  (06/19/2023)  Transportation Needs: No Transportation Needs (06/19/2023)  Utilities: Not At Risk (06/19/2023)  Alcohol Screen: Low Risk  (08/19/2022)  Depression (PHQ2-9): Low Risk  (08/19/2022)  Financial Resource Strain: Low Risk  (08/19/2022)  Physical Activity: Insufficiently Active (08/19/2022)  Social Connections: Socially Isolated (06/19/2023)  Stress: No Stress Concern Present (08/19/2022)  Tobacco Use: Medium Risk (06/18/2023)  Health Literacy: Inadequate Health Literacy (08/19/2022)    Readmission Risk Interventions     No data to display

## 2023-06-26 NOTE — Plan of Care (Signed)
  Problem: Clinical Measurements: Goal: Respiratory complications will improve Outcome: Progressing   Problem: Activity: Goal: Risk for activity intolerance will decrease Outcome: Progressing   Problem: Nutrition: Goal: Adequate nutrition will be maintained Outcome: Progressing   

## 2023-06-26 NOTE — Discharge Summary (Signed)
 Physician Discharge Summary   Patient: Monica Singh MRN: [id number] DOB: Jan 15, 1929  Admit date:     06/18/2023  Discharge date: 06/26/23  Discharge Physician: Montey Apa   PCP: Lamon Pillow, MD   Recommendations at discharge:   Follow up with Primary Care in 1-2 weeks Repeat CBC, CMP at follow up Follow up on pain control and recovery from T11 compression fracture   Discharge Diagnoses: Principal Problem:   Compression fracture of T11 vertebra (HCC) Active Problems:   Ground-level fall   Acute metabolic encephalopathy   E-coli UTI   AKI (acute kidney injury) (HCC)   Hyponatremia   Diastolic CHF, chronic (HCC)   COPD (chronic obstructive pulmonary disease) (HCC)   Essential hypertension   Adnexal mass   Lymphoma of small bowel (HCC)   Advanced dementia (HCC)   Nephrolithiasis   Closed compression fracture of body of L1 vertebra (HCC)   Overweight (BMI 25.0-29.9)   Compression fracture of T11 vertebra, sequela   Thrush   Acute urinary retention   Constipation  Resolved Problems:   * No resolved hospital problems. [facility]:  "89 y.o. female with medical history significant for COPD , diastolic CHF, CKD stage IIIa, dementia, non-Hodgkin's lymphoma in remission, HTN, hypothyroidism, left UPJ obstruction status post stent on 5/2, and discharged 2 days ago from hospitalization from 5/17 to 06/17/2023 for COPD/CHF exacerbation and E. coli UTI, still on antibiotics who presents from her assisted memory care unit following a fall while attempting to ambulate with her new rollator.  She fell backwards, hitting her head and had new onset of back pain.  Daughter at bedside contributes to history. ED course and data review: Tachycardic to about 103 with otherwise unremarkable vitals Labs notable for mild leukocytosis Of 13,600 slightly up from 11.3 on 5/20 Creatinine 1.12 up from 0.74 Potassium 5.4 Troponin 7 EKG, personally viewed and interpreted showing  sinus at 99 with nonspecific T wave changes Trauma imaging included CT head, C-spine, L-spine and hip, as well as chest x-ray-which was significant for a new superior L1 endplate  fracture and a stable left adnexal cyst of 7.3 cm   Patient was treated with fentanyl  for pain Given a 500 cc NS bolus Fitted with a TLSO brace, but was unable to ambulate with the brace so admission requested    5/22.  Family would like to do conservative management with pain control physical therapy and Occupational Therapy evaluations. 5/23.  Patient having a lot of pain and not doing much with physical therapy.  Consulted interventional radiology for kyphoplasty. 5/24.  Family concerned that patient is sleeping a lot.  Will get rid of IV pain medication. 5/25.  Patient not having pain if she does not move.  Patient awake this morning.  Still has upper airway congestion. 5/26.  Foley catheter placed for urinary retention.  Patient still having upper airway congestion will add Mucomyst nebulizers."     I assumed care on 06/25/23.   5/28 -- foley removed for voiding trial.  Back pain much improved after Kyphoplasty.  5/29 -- pt doing well, able to successfully void.  Pain controlled fairly well.  Patient is medically stable for discharge to rehab today.  Patient and family agreeable with the plan.  Assessment and Plan:  * Compression fracture of T11 vertebra Ground-level fall while using rollator History of kyphoplasty 2019 5/27 - IR performed Kyphoplasty 5/28 - pt notes pain essentially resolved after Kyphoplasty  --Pain control with scheduled Tylenol ,  PRN tramadol  or oxycodone  --Stool softeners scheduled, PRN fleet enema if no BM in 48 hours --Discontinued IV pain medication on 5/24. --Continue TLSO brace with all mobility --PT/OT -- SNF recommended   --Fall precautions  --PCP follow up in 1-2 weeks   Acute metabolic encephalopathy - resolved.  Mental status at baseline.  CT scan of the head  negative. History of dementia.    --Delirium precautions --Minimize sedating meds   AKI -- with Cr 1.12 up from baseline of 0.74, resolved. --Monitor BMP   E-coli UTI --Cefadroxil completed --On Hiprex  for UTI prevention - resume at d/c   Diastolic CHF, chronic Clinically euvolemic to dry.  EF 60% --Monitor volume status     Hyponatremia - chronically appears Na levels 129--133 --Treated with gentle IV fluids x 1 day given poor PO intake --Repeat BMP in AM   COPD - stable Not acutely exacerbated, though had recent exacerbation.   Noted to have upper airway congestion.   --Treated with nebulized and inhalerd bronchodilators --Incentive spirometry and flutter valve --Taper prednisone  -- on 10 mg daily --Mucomyst nebulizers during admission   Essential hypertension Blood pressure and heart rate likely elevated with pain. --Discontinued Cardizem CD     Adnexal mass 5.3 cm left adnexal mass.  Present since 2020.   Constipation Bowel regimen per orders   Acute urinary retention Patient had In-out catheterizations.  Foley catheter placed on 5/26.   --Discontinue Foley this AM for voiding trial --Bladder scans to monitor   Thrush --Continue Diflucan  to complete 14 days   Overweight (BMI 25.0-29.9) BMI 26.21   Nephrolithiasis History of cystoscopy with ureteral stent placement on 05/30/2023 No acute issues. Monitor.    Advanced dementia Delirium precautions   Lymphoma of small bowel No acute issues suspected       Consultants: Interventional Radiology  Procedures performed: Kyphoplasty   Disposition: Skilled nursing facility  Diet recommendation:  Regular diet   DISCHARGE MEDICATION: Allergies as of 06/26/2023       Reactions   Ciprofloxacin  Itching   Whelps on face reported 03/16/2021   Latex Itching   Misc. Sulfonamide Containing Compounds    Sulfa Antibiotics Hives, Itching        Medication List     STOP taking these medications     cefadroxil 500 MG capsule Commonly known as: DURICEF   FLORASTOR PO       TAKE these medications    acetaminophen  325 MG tablet Commonly known as: TYLENOL  Take 2 tablets (650 mg total) by mouth every 6 (six) hours. What changed:  medication strength how much to take when to take this   AeroChamber MV inhaler Use as instructed   albuterol  108 (90 Base) MCG/ACT inhaler Commonly known as: VENTOLIN  HFA Inhale 2 puffs into the lungs every 6 (six) hours as needed for wheezing or shortness of breath.   benzonatate  100 MG capsule Commonly known as: TESSALON  Take 1 capsule (100 mg total) by mouth 2 (two) times daily as needed for cough.   Breztri  Aerosphere 160-9-4.8 MCG/ACT Aero inhaler Generic drug: budesonide -glycopyrrolate -formoterol  Inhale 2 puffs into the lungs in the morning and at bedtime.   cholecalciferol 25 MCG (1000 UNIT) tablet Commonly known as: VITAMIN D3 Take 1,000 Units by mouth daily.   ferrous sulfate  325 (65 FE) MG tablet Take 325 mg by mouth daily with breakfast.   fluconazole  100 MG tablet Commonly known as: DIFLUCAN  Take 1 tablet (100 mg total) by mouth daily for 10 days. Start taking on:  Jun 27, 2023   guaiFENesin  600 MG 12 hr tablet Commonly known as: MUCINEX  Take 2 tablets (1,200 mg total) by mouth 2 (two) times daily as needed for to loosen phlegm or cough.   ipratropium 0.02 % nebulizer solution Commonly known as: ATROVENT  Take 0.5 mg by nebulization every 8 (eight) hours as needed for shortness of breath (cough).   methenamine  1 g tablet Commonly known as: HIPREX  TAKE ONE (1) TABLET BY MOUTH TWO TIMES PER DAY   oxyCODONE  5 MG immediate release tablet Commonly known as: Oxy IR/ROXICODONE  Take 1 tablet (5 mg total) by mouth every 6 (six) hours as needed for severe pain (pain score 7-10).   polyethylene glycol 17 g packet Commonly known as: MIRALAX  / GLYCOLAX  Take 17 g by mouth 2 (two) times daily.   predniSONE  10 MG  tablet Commonly known as: DELTASONE  Take 1 tablet (10 mg total) by mouth daily with breakfast. Start taking on: Jun 27, 2023 What changed:  medication strength how much to take   psyllium 0.52 g capsule Commonly known as: REGULOID Take 0.52 g by mouth daily.   senna-docusate 8.6-50 MG tablet Commonly known as: Senokot-S Take 1 tablet by mouth daily.   sodium phosphate  Enem Place 133 mLs (1 enema total) rectally daily as needed for severe constipation (if no BM in over 48 hours).   traMADol  50 MG tablet Commonly known as: Ultram  Take 1 tablet (50 mg total) by mouth every 6 (six) hours as needed.        Follow-up Information     Fisher, Erlinda Haws, MD.   Specialty: Family Medicine Contact information: [address] [place] [address] [phone]         Tonita Frater, MD.   Specialties: Orthopedic Surgery, Sports Medicine Contact information: [address] [place] [address] [phone]                Discharge Exam: Cleavon Curls Weights   06/18/23 1738  Weight: 65 kg   General exam: awake, alert, no acute distress HEENT: moist mucus membranes, hearing grossly normal  Respiratory system: CTAB, no wheezes, rales or rhonchi, normal respiratory effort. Cardiovascular system: normal S1/S2, RRR, no JVD, murmurs, rubs, gallops, no pedal edema.   Gastrointestinal system: soft, NT, ND, no HSM felt, +bowel sounds. Central nervous system: A&O x 2+. no gross focal neurologic deficits, normal speech Extremities: moves all, no edema, normal tone Skin: dry, intact, normal temperature Psychiatry: normal mood, congruent affect   Condition at discharge: stable  The results of significant diagnostics from this hospitalization (including imaging, microbiology, ancillary and laboratory) are listed below for reference.   Imaging Studies: IR KYPHO THORACIC WITH BONE BIOPSY Result Date: 06/24/2023 CLINICAL DATA:  Acute/subacute and highly  symptomatic T11 compression fracture (compression fracture labeled as T11 on the MRI of the lumbar spine but labeled as T10 on the MRI of the thoracic spine). EXAM: FLUOROSCOPIC GUIDED KYPHOPLASTY OF THE T11 VERTEBRAL BODY COMPARISON:  None Available. MEDICATIONS: As antibiotic prophylaxis, 2 g Ancef  was ordered pre-procedure and administered intravenously within 1 hour of incision. ANESTHESIA/SEDATION: Moderate (conscious) sedation was employed during this procedure. A total of Versed  2.5 mg and Fentanyl  125 mcg was administered intravenously by the Radiology nurse. Moderate Sedation Time: 29 minutes. The patient's level of consciousness and vital signs were monitored continuously by radiology nursing throughout the procedure under my direct supervision. FLUOROSCOPY TIME:  Radiation exposure index: 324 mGy, reference air kerma COMPLICATIONS: None immediate. PROCEDURE:  The procedure, risks (including but not limited to bleeding, infection, organ damage), benefits, and alternatives were explained to the patient. Questions regarding the procedure were encouraged and answered. The patient understands and consents to the procedure. The patient has suffered a fracture of the T11 vertebral body. It is recommended that patients aged 65 years or older be evaluated for possible testing or treatment of osteoporosis. The patient was placed prone on the fluoroscopic table. The skin overlying the thoracic region was then prepped and draped in the usual sterile fashion. Maximal barrier sterile technique was utilized including caps, mask, sterile gowns, sterile gloves, sterile drape, hand hygiene and skin antiseptic. Intravenous Fentanyl  and Versed  were administered as conscious sedation during continuous cardiorespiratory monitoring by the radiology RN. The left pedicle at T11 was then infiltrated with 1% lidocaine  followed by the advancement of a Kyphon trocar needle through the left pedicle into the posterior one-third of the  vertebral body. Subsequently, the osteo drill was advanced to the anterior third of the vertebral body. The osteo drill was retracted. Through the working cannula, a Kyphon inflatable bone tamp 15 x 2.5 was advanced and positioned with the distal marker approximately 5 mm from the anterior aspect of the cortex. Appropriate positioning was confirmed on the AP projection. At this time, the balloon was expanded using contrast via a Kyphon inflation syringe device via micro tubing. In similar fashion, the right T11 pedicle was infiltrated with 1% lidocaine  followed by the advancement of a second Kyphon trocar needle through the right pedicle into the posterior third of the vertebral body. Subsequently, the osteo drill was coaxially advanced to the anterior right third. The osteo drill was exchanged for a Kyphon inflatable bone tamp 15 x 2.5, advanced to the 5 mm of the anterior aspect of the cortex. The balloon was then expanded using contrast as above. Inflations were continued until there was near apposition with the superior end plate. At this time, methylmethacrylate mixture was reconstituted in the Kyphon bone mixing device system. This was then loaded into the delivery mechanism, attached to Kyphon bone fillers. The balloons were deflated and removed followed by the instillation of methylmethacrylate mixture with excellent filling in the AP and lateral projections. No extravasation was noted in the disk spaces or posteriorly into the spinal canal. No epidural venous contamination was seen. The working cannulae and the bone filler were then retrieved and removed. Hemostasis was achieved with manual compression. The patient tolerated the procedure well without immediate postprocedural complication. IMPRESSION: 1. Technically successful T11 vertebral body augmentation using balloon kyphoplasty. 2. Per CMS PQRS reporting requirements (PQRS Measure 24): Given the patient's age of greater than 50 and the fracture site  (hip, distal radius, or spine), the patient should be tested for osteoporosis using DXA, and the appropriate treatment considered based on the DXA results. Electronically Signed   By: Fernando Hoyer M.D.   On: 06/24/2023 13:28   CT HEAD WO CONTRAST ( ) Result Date: 06/21/2023 EXAM: CT HEAD WITHOUT 06/21/2023 10:09:00 AM TECHNIQUE: CT of the head was performed without the administration of intravenous contrast. Automated exposure control, iterative reconstruction, and/or weight based adjustment of the mA/kV was utilized to reduce the radiation dose to as low as reasonably achievable. COMPARISON: CT head without contrast 06/18/2023. MR head without and with contrast 12/19/2019. CLINICAL HISTORY: Mental status change, unknown cause. FINDINGS: BRAIN AND VENTRICLES: Advanced atrophy and white matter disease is again noted, stable. The ventricles are proportionate to the degree of atrophy. No acute intracranial hemorrhage,  mass effect or midline shift. No abnormal extra-axial fluid collection. The gray-white differentiation is maintained without evidence of an acute infarct. There is no evidence of hydrocephalus. ORBITS: Lens replacements are noted bilaterally. The globes and orbits are otherwise within normal limits. SINUSES: The visualized paranasal sinuses and mastoid air cells demonstrate no acute abnormality. SOFT TISSUES AND SKULL: Previously noted left parietal scalp hematoma near the vertex has partially resolved. No acute fracture is associated. No other significant extracranial soft tissue injury is present. VASCULATURE: Atherosclerotic calcifications are present in the cavernous carotid arteries bilaterally. No hyperdense vessel is present. IMPRESSION: 1. No acute intracranial abnormality. 2. Stable advanced atrophy and white matter disease. 3. Partially resolved left parietal scalp hematoma near the vertex, without associated acute fracture. Electronically signed by: Audree Leas MD 06/21/2023  02:24 PM EDT RP Workstation: [id number]   MR LUMBAR SPINE WO CONTRAST Result Date: 06/20/2023 EXAM: MRI LUMBAR SPINE 06/20/2023 11:57:27 AM TECHNIQUE: Multiplanar multisequence MRI of the lumbar spine was performed without the administration of intravenous contrast. COMPARISON: Lumbar spine CTA dated 06/18/2023. Lumbar spine radiographs dated 06/18/2023. Lumbar spine MRI dated 12/14/2020. CLINICAL HISTORY: Low back pain, trauma. Back trauma, abnormal neuro exam, CT positive. History of fall. FINDINGS: BONES AND ALIGNMENT: A superior endplate fracture at L1 is healed following spinal augmentation. Acute/subacute superior endplate fractures at L2 and L3 demonstrate edema within the upper vertebral bodies. 40% loss of height is present at L2. 10% loss of height is present at L3. No retropulsed bone is present. Leftward curvature of the lumbar spine is centered at L2-3. Compensatory rightward curvature is present at L5 to S1. SPINAL CORD: The conus terminates normally. SOFT TISSUES: No paraspinal mass. L1-L2: Leftward disc protrusion is present at L1-2 without focal stenosis. L2-L3: A broad-based disc protrusion and central annular tear are present at L2-3. Mild foraminal narrowing is present bilaterally. L3-L4: Chronic loss of disc height and slight anterolisthesis is present at L3-4. Moderate central canal stenosis is present with crowding of the nerve roots. Severe right and mild left foraminal narrowing is present at L3-4. L4-L5: A broad-based disc protrusion is present at L4-5. Facet hypertrophy is worse on the left. Mild left subarticular narrowing is present. Moderate foraminal stenosis is present on the left. Mild right foraminal narrowing is present. L5-S1: A shallow central disc protrusion is present at L5 to S1. Asymmetric facet hypertrophy is present. Moderate left foraminal stenosis is present at L5 to S1. VASCULATURE: Atherosclerosis is present in the aorta without aneurysm. KIDNEYS: Mild atrophy is  noted in the left kidney. IMPRESSION: 1. Acute/subacute superior endplate fractures at L2 and L3 with edema and 40% and 10% loss of height, respectively. No retropulsed bone. 2. Moderate central canal stenosis at L3-4 with crowding of the nerve roots. Severe right and mild left foraminal narrowing at L3-4. 3. Moderate left foraminal stenosis at L5-S1. 4. Lumbar scoliosis as described. Electronically signed by: Audree Leas MD 06/20/2023 03:59 PM EDT RP Workstation: [id number]   MR THORACIC SPINE WO CONTRAST Result Date: 06/20/2023 EXAM: MR Thoracic Spine without 06/20/2023 11:57:27 AM TECHNIQUE: Multiplanar multisequence MRI of the thoracic spine was performed without the administration of intravenous contrast. COMPARISON: 2-view chest x-ray comparison to 1 view chest neck 06/18/2023. CLINICAL HISTORY: Back trauma, abnormal neuro exam, CT or xray positive (Age >= 16y). FINDINGS: BONES/ALIGNMENT: Spinal augmentation is noted in a healed T12 vertebral fracture. Acute/subacute inferior endplate fracture at T10 demonstrates 40% loss of height. No significant retropulsed bone is present. A remote superior endplate fracture  at T9 demonstrates 40% loss of height. Rightward curvature of the thoracic spine is centered at T4-5. SPINAL CORD: No abnormal cord signal is seen. SOFT TISSUES: No paraspinal mass identified.  Left renal atrophy is noted. DEGENERATIVE CHANGES: No significant spinal canal stenosis or neural foraminal narrowing of the thoracic spine. IMPRESSION: 1. Acute/subacute inferior endplate fracture at T10 with 40% loss of height. No significant retropulsed bone. 2. Remote superior endplate fracture at T9 with 40% loss of height. 3. Healed T12 vertebral fracture with spinal augmentation. Electronically signed by: Audree Leas MD 06/20/2023 03:52 PM EDT RP Workstation: [id number]   CT Hip Left Wo Contrast Result Date: 06/18/2023 CLINICAL DATA:  Fall with hip pain EXAM: CT OF THE LEFT HIP  WITHOUT CONTRAST TECHNIQUE: Multidetector CT imaging of the left hip was performed according to the standard protocol. Multiplanar CT image reconstructions were also generated. RADIATION DOSE REDUCTION: This exam was performed according to the departmental dose-optimization program which includes automated exposure control, adjustment of the mA and/or kV according to patient size and/or use of iterative reconstruction technique. COMPARISON:  06/18/2023 FINDINGS: Bones/Joint/Cartilage No definitive acute fracture or malalignment. No significant hip effusion. Moderate degenerative changes and chondrocalcinosis. Ligaments Suboptimally assessed by CT. Muscles and Tendons No intramuscular fluid collections.  No abnormal atrophy Soft tissues Large left adnexal cyst measuring 7.3 by 5.9 by 7.2 cm. Partially visualized left ureteral stent IMPRESSION: 1. No CT evidence for acute osseous abnormality. 2. Moderate degenerative changes of the left hip. 3. Large left adnexal cyst measuring up to 7.3 cm. This is stable compared with 2024 abdominopelvic CT, slightly enlarged compared to 2022 emanation. Nonemergent follow-up pelvic ultrasound may be performed as indicated given advanced age of patient Electronically Signed   By: Esmeralda Hedge M.D.   On: 06/18/2023 21:02   CT Lumbar Spine Wo Contrast Result Date: 06/18/2023 CLINICAL DATA:  Fall with back pain EXAM: CT LUMBAR SPINE WITHOUT CONTRAST TECHNIQUE: Multidetector CT imaging of the lumbar spine was performed without intravenous contrast administration. Multiplanar CT image reconstructions were also generated. RADIATION DOSE REDUCTION: This exam was performed according to the departmental dose-optimization program which includes automated exposure control, adjustment of the mA and/or kV according to patient size and/or use of iterative reconstruction technique. COMPARISON:  Lumbar CT 02/17/2017 FINDINGS: Segmentation: 5 lumbar type vertebrae. Alignment: Levoscoliosis.  Vertebrae: Treated compression deformity at L1. Moderate age indeterminate superior endplate deformity at T10. Acute appearing compression fracture involving T11 vertebral body, involves the anterior cortex, superior and probably the inferior endplate. No lucency at the posterior arch. Age indeterminate moderate superior endplate fracture at L2. Paraspinal and other soft tissues: Mild paravertebral edema at T11. Aortic atherosclerosis. Incompletely visualized 5.3 cm left adnexal cyst Disc levels: Moderate severe disc space narrowing at L3-L4, L4-L5 and L5-S1 with vacuum discs. Moderate facet degeneration at L2-L3 with advanced bilateral facet disease at L3-L4, L4-L5 and L5-S1. Moderate severe spinal canal stenosis at L3-L4 and L4-L5 secondary to disc disease and posterior facet hypertrophy. Moderate severe right foraminal narrowing at L3-L4 and moderate foraminal narrowing on the left at L4-L5. Moderate left foraminal narrowing at L5-S1. IMPRESSION: 1. Acute appearing compression fracture involving T11 vertebral body, involves the anterior cortex, superior and probably the inferior endplate. No fracture lucency at the posterior arch. 2. Age indeterminate moderate superior endplate fracture at T10. 3. Age indeterminate moderate superior endplate fracture at L2. 4. Treated compression deformity at L1. 5. Levoscoliosis with multilevel degenerative changes. Moderate severe spinal canal stenosis at L3-L4 and L4-L5. 6.  Incompletely visualized 5.3 cm left adnexal cyst. Ultrasound follow-up may be performed if desired given advanced age of patient 7. Aortic atherosclerosis. Aortic Atherosclerosis (ICD10-I70.0). Electronically Signed   By: Esmeralda Hedge M.D.   On: 06/18/2023 20:56   DG Lumbar Spine 2-3 Views Result Date: 06/18/2023 CLINICAL DATA:  Fall EXAM: LUMBAR SPINE - 2-3 VIEW COMPARISON:  CT 11/16/2022 FINDINGS: Osteopenia. Left-sided ureteral stent.treated compression deformity at L1. New superior endplate  deformity at L2 of uncertain age. Moderate disc space narrowing and degenerative change L3-L4, L4-L5 and L5-S1. Diffuse aortic atherosclerosis. IMPRESSION: 1. Osteopenia with treated compression deformity at L1. New superior endplate deformity at L2 of uncertain age. Correlate for point tenderness and consider correlation with CT. 2. Degenerative changes. Electronically Signed   By: Esmeralda Hedge M.D.   On: 06/18/2023 19:33   DG Chest 1 View Result Date: 06/18/2023 CLINICAL DATA:  Cough EXAM: CHEST  1 VIEW COMPARISON:  06/14/2023 FINDINGS: No acute airspace disease or effusion. Stable cardiomediastinal silhouette with aortic atherosclerosis. No pneumothorax IMPRESSION: No active disease. Electronically Signed   By: Esmeralda Hedge M.D.   On: 06/18/2023 19:31   DG Hip Unilat W or Wo Pelvis 2-3 Views Left Result Date: 06/18/2023 CLINICAL DATA:  Fall EXAM: DG HIP (WITH OR WITHOUT PELVIS) 2-3V LEFT COMPARISON:  CT 11/16/2022, radiograph 04/10/2018 for FINDINGS: Bones appear osteopenic. Partially visualized left ureteral stent. SI joints are non widened. Pubic symphysis is non widened. Possible acute left superior pubic ramus fracture. Multiple linear lucencies overlying the left trochanter, suspect largely related to skin fold artifact. Vascular calcifications. IMPRESSION: 1. Possible acute left superior pubic ramus fracture. 2. Multiple linear lucencies overlying the left trochanter, suspect largely related to skin fold artifact, however given osteopenia, recommend correlation with CT Electronically Signed   By: Esmeralda Hedge M.D.   On: 06/18/2023 19:28   CT Head Wo Contrast Result Date: 06/18/2023 CLINICAL DATA:  Fall hit head on bookshelf vomiting, pain EXAM: CT HEAD WITHOUT CONTRAST CT CERVICAL SPINE WITHOUT CONTRAST TECHNIQUE: Multidetector CT imaging of the head and cervical spine was performed following the standard protocol without intravenous contrast. Multiplanar CT image reconstructions of the cervical  spine were also generated. RADIATION DOSE REDUCTION: This exam was performed according to the departmental dose-optimization program which includes automated exposure control, adjustment of the mA and/or kV according to patient size and/or use of iterative reconstruction technique. COMPARISON:  11/16/2022 FINDINGS: CT HEAD FINDINGS Brain: No evidence of acute infarction, hemorrhage, hydrocephalus, extra-axial collection or mass lesion/mass effect. Periventricular and deep white matter hypodensity. Mild global cerebral volume loss. Vascular: No hyperdense vessel or unexpected calcification. Skull: Normal. Negative for fracture or focal lesion. Sinuses/Orbits: No acute finding. Other: Soft tissue contusion of the left scalp vertex (series 4, image 44). CT CERVICAL SPINE FINDINGS Alignment: Degenerative straightening and reversal of the normal cervical lordosis. Skull base and vertebrae: No acute fracture. No primary bone lesion or focal pathologic process. Soft tissues and spinal canal: No prevertebral fluid or swelling. No visible canal hematoma. Disc levels: Status post anterior cervical discectomy and fusion of C4-C5 with moderate disc degenerative change of the remaining cervical levels. Upper chest: Negative. Other: None. IMPRESSION: 1. No acute intracranial pathology. Small-vessel white matter disease and global cerebral volume loss in keeping with advanced patient. 2. Soft tissue contusion of the left scalp vertex. 3. No fracture or static subluxation of the cervical spine. 4. Status post anterior cervical discectomy and fusion of C4-C5 with moderate disc degenerative change of the remaining  cervical levels. Electronically Signed   By: Fredricka Jenny M.D.   On: 06/18/2023 19:19   CT Cervical Spine Wo Contrast Result Date: 06/18/2023 CLINICAL DATA:  Fall hit head on bookshelf vomiting, pain EXAM: CT HEAD WITHOUT CONTRAST CT CERVICAL SPINE WITHOUT CONTRAST TECHNIQUE: Multidetector CT imaging of the head and  cervical spine was performed following the standard protocol without intravenous contrast. Multiplanar CT image reconstructions of the cervical spine were also generated. RADIATION DOSE REDUCTION: This exam was performed according to the departmental dose-optimization program which includes automated exposure control, adjustment of the mA and/or kV according to patient size and/or use of iterative reconstruction technique. COMPARISON:  11/16/2022 FINDINGS: CT HEAD FINDINGS Brain: No evidence of acute infarction, hemorrhage, hydrocephalus, extra-axial collection or mass lesion/mass effect. Periventricular and deep white matter hypodensity. Mild global cerebral volume loss. Vascular: No hyperdense vessel or unexpected calcification. Skull: Normal. Negative for fracture or focal lesion. Sinuses/Orbits: No acute finding. Other: Soft tissue contusion of the left scalp vertex (series 4, image 44). CT CERVICAL SPINE FINDINGS Alignment: Degenerative straightening and reversal of the normal cervical lordosis. Skull base and vertebrae: No acute fracture. No primary bone lesion or focal pathologic process. Soft tissues and spinal canal: No prevertebral fluid or swelling. No visible canal hematoma. Disc levels: Status post anterior cervical discectomy and fusion of C4-C5 with moderate disc degenerative change of the remaining cervical levels. Upper chest: Negative. Other: None. IMPRESSION: 1. No acute intracranial pathology. Small-vessel white matter disease and global cerebral volume loss in keeping with advanced patient. 2. Soft tissue contusion of the left scalp vertex. 3. No fracture or static subluxation of the cervical spine. 4. Status post anterior cervical discectomy and fusion of C4-C5 with moderate disc degenerative change of the remaining cervical levels. Electronically Signed   By: Fredricka Jenny M.D.   On: 06/18/2023 19:19   ECHOCARDIOGRAM COMPLETE Result Date: 06/17/2023    ECHOCARDIOGRAM REPORT   Patient Name:    Dorina  [facility] Date of Exam: 06/15/2023 Medical Rec #:  [id number]           Height:       62.0 in Accession #:    [id number]          Weight:       144.0 lb Date of Birth:  09/10/1928           BSA:          1.663 m Patient Age:    89 years            BP:           136/60 mmHg Patient Gender: F                   HR:           100 bpm. Exam Location:  [facility] Procedure: 2D Echo, Color Doppler and Cardiac Doppler (Both Spectral and Color            Flow Doppler were utilized during procedure). Indications:     I50.31 Acute Diastolic CHF  History:         Patient has prior history of Echocardiogram examinations. COPD;                  Risk Factors:Hypertension.  Sonographer:     L. Thornton-Maynard Referring Phys:  [id number] [place] Diagnosing Phys: Antonette Batters MD  Sonographer Comments: Suboptimal apical window. IMPRESSIONS  1. Left ventricular ejection fraction, by estimation, is 60  to 65%. The left ventricle has normal function. The left ventricle has no regional wall motion abnormalities. Left ventricular diastolic parameters are consistent with Grade I diastolic dysfunction (impaired relaxation).  2. Right ventricular systolic function is normal. The right ventricular size is normal. There is normal pulmonary artery systolic pressure.  3. The mitral valve is degenerative. Mild mitral valve regurgitation. Moderate to severe mitral annular calcification.  4. The aortic valve is normal in structure. Aortic valve regurgitation is not visualized. Aortic valve sclerosis is present, with no evidence of aortic valve stenosis. FINDINGS  Left Ventricle: Left ventricular ejection fraction, by estimation, is 60 to 65%. The left ventricle has normal function. The left ventricle has no regional wall motion abnormalities. Strain was performed and the global longitudinal strain is indeterminate. Global longitudinal strain performed but not reported based on interpreter judgement due to suboptimal tracking. The left  ventricular internal cavity size was normal in size. There is no left ventricular hypertrophy. Left ventricular diastolic parameters are consistent with Grade I diastolic dysfunction (impaired relaxation). Right Ventricle: The right ventricular size is normal. No increase in right ventricular wall thickness. Right ventricular systolic function is normal. There is normal pulmonary artery systolic pressure. The tricuspid regurgitant velocity is 2.71 m/s, and  with an assumed right atrial pressure of 3 mmHg, the estimated right ventricular systolic pressure is 32.4 mmHg. Left Atrium: Left atrial size was normal in size. Right Atrium: Right atrial size was normal in size. Pericardium: There is no evidence of pericardial effusion. Mitral Valve: The mitral valve is degenerative in appearance. Moderate to severe mitral annular calcification. Mild mitral valve regurgitation. MV peak gradient, 15.4 mmHg. The mean mitral valve gradient is 7.5 mmHg. Tricuspid Valve: The tricuspid valve is normal in structure. Tricuspid valve regurgitation is trivial. Aortic Valve: The aortic valve is normal in structure. Aortic valve regurgitation is not visualized. Aortic valve sclerosis is present, with no evidence of aortic valve stenosis. Aortic valve mean gradient measures 2.0 mmHg. Aortic valve peak gradient measures 3.5 mmHg. Aortic valve area, by VTI measures 2.38 cm. Pulmonic Valve: The pulmonic valve was normal in structure. Pulmonic valve regurgitation is not visualized. Aorta: The ascending aorta was not well visualized. IAS/Shunts: No atrial level shunt detected by color flow Doppler. Additional Comments: 3D was performed not requiring image post processing on an independent workstation and was indeterminate.  LEFT VENTRICLE PLAX 2D LVIDd:         3.70 cm     Diastology LVIDs:         2.20 cm     LV e' medial:    3.59 cm/s LV PW:         1.00 cm     LV E/e' medial:  29.0 LV IVS:        1.10 cm     LV e' lateral:   3.59 cm/s LVOT  diam:     2.00 cm     LV E/e' lateral: 29.0 LV SV:         39 LV SV Index:   24 LVOT Area:     3.14 cm  LV Volumes (MOD) LV vol d, MOD A2C: 22.0 ml LV vol d, MOD A4C: 40.4 ml LV vol s, MOD A2C: 8.4 ml LV vol s, MOD A4C: 17.1 ml LV SV MOD A2C:     13.6 ml LV SV MOD A4C:     40.4 ml LV SV MOD BP:      18.9 ml RIGHT VENTRICLE  IVC RV Basal diam:  3.90 cm     IVC diam: 0.90 cm RV S prime:     21.80 cm/s TAPSE (M-mode): 2.2 cm LEFT ATRIUM             Index        RIGHT ATRIUM           Index LA diam:        3.90 cm 2.35 cm/m   RA Area:     14.20 cm LA Vol (A2C):   33.0 ml 19.85 ml/m  RA Volume:   33.30 ml  20.03 ml/m LA Vol (A4C):   45.6 ml 27.43 ml/m LA Biplane Vol: 39.1 ml 23.52 ml/m  AORTIC VALVE                    PULMONIC VALVE AV Area (Vmax):    2.40 cm     PV Vmax:       0.89 m/s AV Area (Vmean):   2.28 cm     PV Peak grad:  3.2 mmHg AV Area (VTI):     2.38 cm AV Vmax:           93.70 cm/s AV Vmean:          69.300 cm/s AV VTI:            0.165 m AV Peak Grad:      3.5 mmHg AV Mean Grad:      2.0 mmHg LVOT Vmax:         71.70 cm/s LVOT Vmean:        50.300 cm/s LVOT VTI:          0.125 m LVOT/AV VTI ratio: 0.76  AORTA Ao Root diam: 3.40 cm Ao Asc diam:  3.10 cm MITRAL VALVE                TRICUSPID VALVE MV Area (PHT): 7.16 cm     TR Peak grad:   29.4 mmHg MV Area VTI:   1.36 cm     TR Vmax:        271.00 cm/s MV Peak grad:  15.4 mmHg MV Mean grad:  7.5 mmHg     SHUNTS MV Vmax:       1.96 m/s     Systemic VTI:  0.12 m MV Vmean:      129.5 cm/s   Systemic Diam: 2.00 cm MV Decel Time: 106 msec MV E velocity: 104.00 cm/s MV A velocity: 198.00 cm/s MV E/A ratio:  0.53 Dwayne D Callwood MD Electronically signed by Antonette Batters MD Signature Date/Time: 06/17/2023/1:35:44 PM    Final    DG Chest 2 View Result Date: 06/14/2023 CLINICAL DATA:  Productive cough for 1 week. EXAM: CHEST - 2 VIEW COMPARISON:  January 19, 2023. FINDINGS: Stable cardiomediastinal silhouette. No acute pulmonary  disease is noted. Bony thorax is unremarkable. IMPRESSION: No active cardiopulmonary disease. Electronically Signed   By: Rosalene Colon M.D.   On: 06/14/2023 12:22   DG OR UROLOGY CYSTO IMAGE ([facility] ONLY) Result Date: 05/30/2023 There is no interpretation for this exam.  This order is for images obtained during a surgical procedure.  Please See "Surgeries" Tab for more information regarding the procedure.    Microbiology: Results for orders placed or performed during the hospital encounter of 06/18/23  MRSA Next Gen by PCR, Nasal     Status: None   Collection Time: 06/25/23  9:30 PM   Specimen: Nasal  Mucosa; Nasal Swab  Result Value Ref Range Status   MRSA by PCR Next Gen NOT DETECTED NOT DETECTED Final    Comment: (NOTE) The GeneXpert MRSA Assay (FDA approved for NASAL specimens only), is one component of a comprehensive MRSA colonization surveillance program. It is not intended to diagnose MRSA infection nor to guide or monitor treatment for MRSA infections. Test performance is not FDA approved in patients less than 86 years old. Performed at [facility], [address] [place], [place] [address]     Labs: CBC: Recent Labs  Lab 06/22/23 0540 06/24/23 0045  WBC 9.5 10.4  NEUTROABS  --  7.7  HGB 11.1* 10.7*  HCT 34.1* 31.9*  MCV 87.7 87.2  PLT 341 345   Basic Metabolic Panel: Recent Labs  Lab 06/20/23 0139 06/22/23 0540 06/23/23 0747 06/24/23 0045 06/26/23 0241  NA 131* 129* 130* 129* 132*  K 4.5 3.6 3.8 3.8 3.8  CL 98 93* 96* 96* 100  CO2 25 26 26 24 25   GLUCOSE 119* 104* 109* 126* 112*  BUN 28* 22 23 21 22   CREATININE 0.87 0.75 0.72 0.80 0.73  CALCIUM  8.7* 8.4* 7.8* 8.6* 8.6*   Liver Function Tests: No results for input(s): "AST", "ALT", "ALKPHOS", "BILITOT", "PROT", "ALBUMIN" in the last 168 hours. CBG: No results for input(s): "GLUCAP" in the last 168 hours.  Discharge time spent: greater than 30 minutes.  Signed: Montey Apa,  DO [facility] 06/26/2023

## 2023-06-26 NOTE — Progress Notes (Signed)
 Patient being discharged to [facility]. Report called to [facility].  Discharge instructions and prescription information placed in the packet for discharge.  IV removed with the catheter intact.  Patient to be transported by [facility].

## 2023-07-20 ENCOUNTER — Emergency Department

## 2023-07-20 ENCOUNTER — Emergency Department: Admission: EM | Admit: 2023-07-20 | Discharge: 2023-07-20 | Disposition: A | Discharge status: Home / Self Care

## 2023-07-20 ENCOUNTER — Other Ambulatory Visit: Payer: Self-pay

## 2023-07-20 DIAGNOSIS — R600 Localized edema: Secondary | ICD-10-CM | POA: Insufficient documentation

## 2023-07-20 DIAGNOSIS — I130 Hypertensive heart and chronic kidney disease with heart failure and stage 1 through stage 4 chronic kidney disease, or unspecified chronic kidney disease: Secondary | ICD-10-CM | POA: Diagnosis not present

## 2023-07-20 DIAGNOSIS — I5033 Acute on chronic diastolic (congestive) heart failure: Secondary | ICD-10-CM | POA: Diagnosis not present

## 2023-07-20 DIAGNOSIS — N189 Chronic kidney disease, unspecified: Secondary | ICD-10-CM | POA: Insufficient documentation

## 2023-07-20 DIAGNOSIS — F0390 Unspecified dementia without behavioral disturbance: Secondary | ICD-10-CM | POA: Diagnosis not present

## 2023-07-20 DIAGNOSIS — I509 Heart failure, unspecified: Secondary | ICD-10-CM

## 2023-07-20 DIAGNOSIS — M7989 Other specified soft tissue disorders: Secondary | ICD-10-CM | POA: Diagnosis present

## 2023-07-20 DIAGNOSIS — J449 Chronic obstructive pulmonary disease, unspecified: Secondary | ICD-10-CM | POA: Diagnosis not present

## 2023-07-20 LAB — BASIC METABOLIC PANEL WITH GFR
Anion gap: 9 (ref 5–15)
BUN: 23 mg/dL (ref 8–23)
CO2: 29 mmol/L (ref 22–32)
Calcium: 8.9 mg/dL (ref 8.9–10.3)
Chloride: 99 mmol/L (ref 98–111)
Creatinine, Ser: 1.14 mg/dL — ABNORMAL HIGH (ref 0.44–1.00)
GFR, Estimated: 45 mL/min — ABNORMAL LOW (ref >60)
Glucose, Bld: 100 mg/dL — ABNORMAL HIGH (ref 70–99)
Potassium: 3.7 mmol/L (ref 3.5–5.1)
Sodium: 137 mmol/L (ref 135–145)

## 2023-07-20 LAB — PROTIME-INR
INR: 1.1 (ref 0.8–1.2)
Prothrombin Time: 14.8 s (ref 11.4–15.2)

## 2023-07-20 LAB — CBC
HCT: 39 % (ref 36.0–46.0)
Hemoglobin: 12.2 g/dL (ref 12.0–15.0)
MCH: 29.4 pg (ref 26.0–34.0)
MCHC: 31.3 g/dL (ref 30.0–36.0)
MCV: 94 fL (ref 80.0–100.0)
Platelets: 419 10*3/uL — ABNORMAL HIGH (ref 150–400)
RBC: 4.15 MIL/uL (ref 3.87–5.11)
RDW: 15.3 % (ref 11.5–15.5)
WBC: 8.8 10*3/uL (ref 4.0–10.5)
nRBC: 0 % (ref 0.0–0.2)

## 2023-07-20 LAB — BRAIN NATRIURETIC PEPTIDE: B Natriuretic Peptide: 118.1 pg/mL — ABNORMAL HIGH (ref 0.0–100.0)

## 2023-07-20 MED ORDER — FUROSEMIDE 20 MG PO TABS: 20.0000 mg | ORAL_TABLET | Freq: Every day | ORAL | 0 refills | Status: DC

## 2023-07-20 MED ORDER — FUROSEMIDE 40 MG PO TABS
20.0000 mg | ORAL_TABLET | Freq: Once | ORAL | Status: AC
  Administered 2023-07-20: 20 mg via ORAL
  Filled 2023-07-20: qty 1

## 2023-07-20 NOTE — ED Notes (Signed)
 This RN attempted to call [employer] x3 for report. Family is at bedside and will transport pt back to facility.

## 2023-07-20 NOTE — ED Triage Notes (Addendum)
 Pt comes with bilateral leg swelling for several days. Pt also has known clot in right leg and Pt was started on blood thinner.Pt now having some weeping to legs per family. Pt does have wound with bandage in place on left lower leg.   Family reports pt did have doppler done on left leg and no clot noted this past Wed. Unsure if anything has changed now. Pt having swelling to both legs and feet.   Pt also had blood in her urine recently and hx of multiple UTIs. Pt was recently here at hospital for AKI and lumbar compression fracture after a fall.

## 2023-07-20 NOTE — ED Provider Notes (Signed)
 [facility] Provider Note    Event Date/Time   First MD Initiated Contact with Patient 07/20/23 1022     (approximate)   History   Leg Swelling  Pt comes with bilateral leg swelling for several days. Pt also has known clot in right leg and Pt was started on blood thinner.Pt now having some weeping to legs per family. Pt does have wound with bandage in place on left lower leg.   Family reports pt did have doppler done on left leg and no clot noted this past Wed. Unsure if anything has changed now. Pt having swelling to both legs and feet.   Pt also had blood in her urine recently and hx of multiple UTIs. Pt was recently here at hospital for AKI and lumbar compression fracture after a fall.    HPI Chole  H Galvan is a 89 y.o. female PMH multiple medical comorbidities including hypertension, CKD, dementia, hyponatremia, COPD, recurrent UTI, diastolic CHF presents for evaluation of bilateral leg swelling - Collateral gathered from patient's daughters at bedside.  Note patient was recently diagnosed with a DVT at her living facility [facility]), has been started on Eliquis.  Over the past several days has had worsening swelling of her bilateral lower extremities.  No shortness of breath.  No chest pain.  Nursing assistant noted some weeping from the legs yesterday so they decided to bring patient to hospital today.  No fever, no significant rash but have noticed skin color changes.  Per chart review, last admitted 06/18/2023-06/26/2023 after a fall.  Found to have AKI, mild hyperkalemia.  CT showing new fracture of T11 vertebra .  Admitted for pain control, PT OT  Last echo 06/15/2023-EF 60%.  Grade 1 diastolic dysfunction.     Physical Exam   Triage Vital Signs: ED Triage Vitals  Encounter Vitals Group     BP 07/20/23 1009 (!) 107/59     Girls Systolic BP Percentile --      Girls Diastolic BP Percentile --      Boys Systolic BP Percentile --       Boys Diastolic BP Percentile --      Pulse Rate 07/20/23 1009 (!) 109     Resp 07/20/23 1009 19     Temp 07/20/23 1009 98 F (36.7 C)     Temp src --      SpO2 07/20/23 1009 97 %     Weight 07/20/23 1007 138 lb (62.6 kg)     Height 07/20/23 1007 5' 3 (1.6 m)     Head Circumference --      Peak Flow --      Pain Score 07/20/23 1007 0     Pain Loc --      Pain Education --      Exclude from Growth Chart --     Most recent vital signs: Vitals:   07/20/23 1009  BP: (!) 107/59  Pulse: (!) 109  Resp: 19  Temp: 98 F (36.7 C)  SpO2: 97%     General: Awake, no distress.  CV:  Good peripheral perfusion. RRR, RP 2+ Resp:  Normal effort. CTAB Abd:  No distention. Nontender to deep palpation throughout Other:  2+ pitting edema b/l.  Pedal pulse present bilaterally.  Mild skin changes to anterior shins.  No purulence, no appreciable weeping.  No warmth, crepitus, necrosis.   ED Results / Procedures / Treatments   Labs (all labs ordered are listed, but only abnormal results  are displayed) Labs Reviewed  CBC - Abnormal; Notable for the following components:      Result Value   Platelets 419 (*)    All other components within normal limits  BASIC METABOLIC PANEL WITH GFR - Abnormal; Notable for the following components:   Glucose, Bld 100 (*)    Creatinine, Ser 1.14 (*)    GFR, Estimated 45 (*)    All other components within normal limits  BRAIN NATRIURETIC PEPTIDE - Abnormal; Notable for the following components:   B Natriuretic Peptide 118.1 (*)    All other components within normal limits  PROTIME-INR  URINALYSIS, ROUTINE W REFLEX MICROSCOPIC     EKG  N/a   RADIOLOGY  Radiology interpreted by myself and radiology report reviewed   PROCEDURES:  Critical Care performed: No  Procedures   MEDICATIONS ORDERED IN ED: Medications  furosemide  (LASIX ) tablet 20 mg (has no administration in time range)     IMPRESSION / MDM / ASSESSMENT AND PLAN / ED COURSE   I reviewed the triage vital signs and the nursing notes.                              DDX/MDM/AP: Differential diagnosis includes, but is not limited to, mild exacerbation of CHF, consider venous stasis.  No evidence of infection at time of my eval.  No clinical concern for underlying fracture.  No clinical concern for PE.  Plan: - Labs - Chest x-ray - Reassess  Patient's presentation is most consistent with acute complicated illness / injury requiring diagnostic workup.    ED course below.  Workup notable for mildly elevated BNP, chest x-ray reassuring.  Labs otherwise unremarkable beyond mild bump in creatinine.  Patient is hypervolemic on my eval, and shared decision making with family will start on short course of low-dose Lasix , will receive first dose in the emergency department, plan for 3 more days with plan for close outpatient primary care follow-up.  Strict ED return precautions in place.  Patient agrees with plan.  Also counseled on compression stockings and leg elevation.      FINAL CLINICAL IMPRESSION(S) / ED DIAGNOSES   Final diagnoses:  Bilateral lower extremity edema  Acute on chronic congestive heart failure, unspecified heart failure type (HCC)     Rx / DC Orders   ED Discharge Orders          Ordered    furosemide  (LASIX ) 20 MG tablet  Daily        07/20/23 1241             Note:  This document was prepared using Dragon voice recognition software and may include unintentional dictation errors.   Clarine Ozell LABOR, MD 07/20/23 1245

## 2023-07-20 NOTE — Discharge Instructions (Addendum)
 Your mother's evaluation in the emergency department was overall reassuring.  I do believe she has a mild exacerbation of her heart failure, and we have started her on a short course of a diuretic medication to help treat this.  She received the first dose in the emergency department, and she can take 1 pill daily over the next 3 days until she is reevaluated by her primary care provider.  Return to the emergency department with any new or worsening symptoms.  I also recommend wearing compression stockings and elevating the legs when they are not in use.

## 2023-09-24 ENCOUNTER — Encounter: Payer: Self-pay | Admitting: Urology

## 2024-01-10 ENCOUNTER — Other Ambulatory Visit: Payer: Self-pay

## 2024-01-10 ENCOUNTER — Emergency Department

## 2024-01-10 ENCOUNTER — Inpatient Hospital Stay
Admission: EM | Admit: 2024-01-10 | Discharge: 2024-01-13 | DRG: 193 | Disposition: A | Discharge status: Skilled Nursing Facility | Source: Skilled Nursing Facility | Attending: Internal Medicine | Admitting: Internal Medicine

## 2024-01-10 DIAGNOSIS — M5030 Other cervical disc degeneration, unspecified cervical region: Secondary | ICD-10-CM | POA: Diagnosis present

## 2024-01-10 DIAGNOSIS — A419 Sepsis, unspecified organism: Secondary | ICD-10-CM | POA: Diagnosis not present

## 2024-01-10 DIAGNOSIS — I10 Essential (primary) hypertension: Secondary | ICD-10-CM | POA: Diagnosis present

## 2024-01-10 DIAGNOSIS — N135 Crossing vessel and stricture of ureter without hydronephrosis: Secondary | ICD-10-CM | POA: Diagnosis present

## 2024-01-10 DIAGNOSIS — C859A Non-Hodgkin lymphoma, unspecified, in remission: Secondary | ICD-10-CM | POA: Diagnosis present

## 2024-01-10 DIAGNOSIS — N183 Chronic kidney disease, stage 3 unspecified: Secondary | ICD-10-CM | POA: Diagnosis present

## 2024-01-10 DIAGNOSIS — Z7189 Other specified counseling: Secondary | ICD-10-CM | POA: Diagnosis not present

## 2024-01-10 DIAGNOSIS — T402X5A Adverse effect of other opioids, initial encounter: Secondary | ICD-10-CM | POA: Diagnosis present

## 2024-01-10 DIAGNOSIS — K5903 Drug induced constipation: Secondary | ICD-10-CM | POA: Diagnosis present

## 2024-01-10 DIAGNOSIS — J449 Chronic obstructive pulmonary disease, unspecified: Secondary | ICD-10-CM | POA: Diagnosis not present

## 2024-01-10 DIAGNOSIS — M51369 Other intervertebral disc degeneration, lumbar region without mention of lumbar back pain or lower extremity pain: Secondary | ICD-10-CM | POA: Diagnosis present

## 2024-01-10 DIAGNOSIS — R6520 Severe sepsis without septic shock: Secondary | ICD-10-CM | POA: Diagnosis not present

## 2024-01-10 DIAGNOSIS — F03C0 Unspecified dementia, severe, without behavioral disturbance, psychotic disturbance, mood disturbance, and anxiety: Secondary | ICD-10-CM | POA: Diagnosis present

## 2024-01-10 DIAGNOSIS — Z8572 Personal history of non-Hodgkin lymphomas: Secondary | ICD-10-CM

## 2024-01-10 DIAGNOSIS — Z87891 Personal history of nicotine dependence: Secondary | ICD-10-CM | POA: Diagnosis not present

## 2024-01-10 DIAGNOSIS — Z85828 Personal history of other malignant neoplasm of skin: Secondary | ICD-10-CM | POA: Diagnosis not present

## 2024-01-10 DIAGNOSIS — J440 Chronic obstructive pulmonary disease with acute lower respiratory infection: Secondary | ICD-10-CM | POA: Diagnosis present

## 2024-01-10 DIAGNOSIS — Z1152 Encounter for screening for COVID-19: Secondary | ICD-10-CM | POA: Diagnosis not present

## 2024-01-10 DIAGNOSIS — J189 Pneumonia, unspecified organism: Secondary | ICD-10-CM | POA: Diagnosis present

## 2024-01-10 DIAGNOSIS — Z96653 Presence of artificial knee joint, bilateral: Secondary | ICD-10-CM | POA: Diagnosis present

## 2024-01-10 DIAGNOSIS — Z9221 Personal history of antineoplastic chemotherapy: Secondary | ICD-10-CM | POA: Diagnosis not present

## 2024-01-10 DIAGNOSIS — N390 Urinary tract infection, site not specified: Secondary | ICD-10-CM | POA: Diagnosis not present

## 2024-01-10 DIAGNOSIS — Z8249 Family history of ischemic heart disease and other diseases of the circulatory system: Secondary | ICD-10-CM | POA: Diagnosis not present

## 2024-01-10 DIAGNOSIS — K5900 Constipation, unspecified: Secondary | ICD-10-CM | POA: Diagnosis present

## 2024-01-10 DIAGNOSIS — E039 Hypothyroidism, unspecified: Secondary | ICD-10-CM | POA: Diagnosis present

## 2024-01-10 DIAGNOSIS — N1831 Chronic kidney disease, stage 3a: Secondary | ICD-10-CM | POA: Diagnosis present

## 2024-01-10 DIAGNOSIS — Z66 Do not resuscitate: Secondary | ICD-10-CM | POA: Diagnosis present

## 2024-01-10 DIAGNOSIS — I130 Hypertensive heart and chronic kidney disease with heart failure and stage 1 through stage 4 chronic kidney disease, or unspecified chronic kidney disease: Secondary | ICD-10-CM | POA: Diagnosis present

## 2024-01-10 DIAGNOSIS — J9601 Acute respiratory failure with hypoxia: Secondary | ICD-10-CM | POA: Diagnosis present

## 2024-01-10 DIAGNOSIS — N131 Hydronephrosis with ureteral stricture, not elsewhere classified: Secondary | ICD-10-CM | POA: Diagnosis present

## 2024-01-10 DIAGNOSIS — Z515 Encounter for palliative care: Secondary | ICD-10-CM | POA: Diagnosis not present

## 2024-01-10 DIAGNOSIS — I340 Nonrheumatic mitral (valve) insufficiency: Secondary | ICD-10-CM | POA: Diagnosis present

## 2024-01-10 DIAGNOSIS — Z803 Family history of malignant neoplasm of breast: Secondary | ICD-10-CM | POA: Diagnosis not present

## 2024-01-10 DIAGNOSIS — Z806 Family history of leukemia: Secondary | ICD-10-CM | POA: Diagnosis not present

## 2024-01-10 DIAGNOSIS — I5032 Chronic diastolic (congestive) heart failure: Secondary | ICD-10-CM | POA: Diagnosis present

## 2024-01-10 LAB — COMPREHENSIVE METABOLIC PANEL WITH GFR
ALT: 10 U/L (ref 0–44)
AST: 17 U/L (ref 15–41)
Albumin: 3.6 g/dL (ref 3.5–5.0)
Alkaline Phosphatase: 79 U/L (ref 38–126)
Anion gap: 13 (ref 5–15)
BUN: 22 mg/dL (ref 8–23)
CO2: 26 mmol/L (ref 22–32)
Calcium: 8.6 mg/dL — ABNORMAL LOW (ref 8.9–10.3)
Chloride: 95 mmol/L — ABNORMAL LOW (ref 98–111)
Creatinine, Ser: 0.91 mg/dL (ref 0.44–1.00)
GFR, Estimated: 59 mL/min — ABNORMAL LOW (ref >60)
Glucose, Bld: 133 mg/dL — ABNORMAL HIGH (ref 70–99)
Potassium: 3.8 mmol/L (ref 3.5–5.1)
Sodium: 134 mmol/L — ABNORMAL LOW (ref 135–145)
Total Bilirubin: 0.3 mg/dL (ref 0.0–1.2)
Total Protein: 6.6 g/dL (ref 6.5–8.1)

## 2024-01-10 LAB — URINALYSIS, COMPLETE (UACMP) WITH MICROSCOPIC
Bilirubin Urine: NEGATIVE
Glucose, UA: NEGATIVE mg/dL
Ketones, ur: NEGATIVE mg/dL
Nitrite: POSITIVE — AB
Protein, ur: 30 mg/dL — AB
RBC / HPF: >50 RBC/hpf (ref 0–5)
Specific Gravity, Urine: 1.024 (ref 1.005–1.030)
WBC, UA: >50 WBC/hpf (ref 0–5)
pH: 6 (ref 5.0–8.0)

## 2024-01-10 LAB — CBC
HCT: 31.9 % — ABNORMAL LOW (ref 36.0–46.0)
Hemoglobin: 10.1 g/dL — ABNORMAL LOW (ref 12.0–15.0)
MCH: 28.1 pg (ref 26.0–34.0)
MCHC: 31.7 g/dL (ref 30.0–36.0)
MCV: 88.9 fL (ref 80.0–100.0)
Platelets: 379 K/uL (ref 150–400)
RBC: 3.59 MIL/uL — ABNORMAL LOW (ref 3.87–5.11)
RDW: 14.7 % (ref 11.5–15.5)
WBC: 7.6 K/uL (ref 4.0–10.5)
nRBC: 0 % (ref 0.0–0.2)

## 2024-01-10 LAB — RESP PANEL BY RT-PCR (RSV, FLU A&B, COVID)  RVPGX2
Influenza A by PCR: NEGATIVE
Influenza B by PCR: NEGATIVE
Resp Syncytial Virus by PCR: NEGATIVE
SARS Coronavirus 2 by RT PCR: NEGATIVE

## 2024-01-10 LAB — LACTIC ACID, PLASMA
Lactic Acid, Venous: 2.9 mmol/L (ref 0.5–1.9)
Lactic Acid, Venous: 1.4 mmol/L (ref 0.5–1.9)

## 2024-01-10 LAB — MAGNESIUM: Magnesium: 2 mg/dL (ref 1.7–2.4)

## 2024-01-10 LAB — TROPONIN T, HIGH SENSITIVITY
Troponin T High Sensitivity: 83 ng/L — ABNORMAL HIGH (ref 0–19)
Troponin T High Sensitivity: 67 ng/L — ABNORMAL HIGH (ref 0–19)
Troponin T High Sensitivity: 73 ng/L — ABNORMAL HIGH (ref 0–19)

## 2024-01-10 LAB — PRO BRAIN NATRIURETIC PEPTIDE: Pro Brain Natriuretic Peptide: 1164 pg/mL — ABNORMAL HIGH (ref <300.0)

## 2024-01-10 MED ORDER — SODIUM CHLORIDE 0.9 % IV SOLN
2.0000 g | INTRAVENOUS | Status: DC
  Administered 2024-01-11 – 2024-01-12 (×2): 2 g via INTRAVENOUS
  Filled 2024-01-10 (×3): qty 20

## 2024-01-10 MED ORDER — HEPARIN SODIUM (PORCINE) 5000 UNIT/ML IJ SOLN
5000.0000 [IU] | Freq: Three times a day (TID) | INTRAMUSCULAR | Status: DC
  Administered 2024-01-10 – 2024-01-12 (×5): 5000 [IU] via SUBCUTANEOUS
  Filled 2024-01-10 (×5): qty 1

## 2024-01-10 MED ORDER — BUDESON-GLYCOPYRROL-FORMOTEROL 160-9-4.8 MCG/ACT IN AERO
2.0000 | INHALATION_SPRAY | Freq: Two times a day (BID) | RESPIRATORY_TRACT | Status: DC
  Administered 2024-01-11 – 2024-01-13 (×5): 2 via RESPIRATORY_TRACT
  Filled 2024-01-10: qty 5.9

## 2024-01-10 MED ORDER — IPRATROPIUM-ALBUTEROL 0.5-2.5 (3) MG/3ML IN SOLN
3.0000 mL | Freq: Once | RESPIRATORY_TRACT | Status: AC
  Administered 2024-01-10: 3 mL via RESPIRATORY_TRACT
  Filled 2024-01-10: qty 3

## 2024-01-10 MED ORDER — VITAMIN D 25 MCG (1000 UNIT) PO TABS
1000.0000 [IU] | ORAL_TABLET | Freq: Every day | ORAL | Status: DC
  Administered 2024-01-11 – 2024-01-13 (×3): 1000 [IU] via ORAL
  Filled 2024-01-10 (×3): qty 1

## 2024-01-10 MED ORDER — SENNOSIDES-DOCUSATE SODIUM 8.6-50 MG PO TABS: 1.0000 | ORAL_TABLET | Freq: Every evening | ORAL | Status: DC | PRN

## 2024-01-10 MED ORDER — SODIUM CHLORIDE 0.9% FLUSH
3.0000 mL | Freq: Two times a day (BID) | INTRAVENOUS | Status: DC
  Administered 2024-01-10 – 2024-01-13 (×6): 3 mL via INTRAVENOUS

## 2024-01-10 MED ORDER — SODIUM CHLORIDE 0.9 % IV BOLUS (SEPSIS)
1000.0000 mL | Freq: Once | INTRAVENOUS | Status: AC
  Administered 2024-01-10: 1000 mL via INTRAVENOUS

## 2024-01-10 MED ORDER — ONDANSETRON HCL 4 MG/2ML IJ SOLN: 4.0000 mg | Freq: Four times a day (QID) | INTRAMUSCULAR | Status: DC | PRN

## 2024-01-10 MED ORDER — SODIUM CHLORIDE 0.9 % IV SOLN
2.0000 g | Freq: Once | INTRAVENOUS | Status: AC
  Administered 2024-01-10: 2 g via INTRAVENOUS
  Filled 2024-01-10: qty 20

## 2024-01-10 MED ORDER — ACETAMINOPHEN 650 MG RE SUPP: 650.0000 mg | Freq: Four times a day (QID) | RECTAL | Status: DC | PRN

## 2024-01-10 MED ORDER — OXYCODONE HCL 5 MG PO TABS: 5.0000 mg | ORAL_TABLET | Freq: Four times a day (QID) | ORAL | Status: DC | PRN

## 2024-01-10 MED ORDER — TRAMADOL HCL 50 MG PO TABS: 50.0000 mg | ORAL_TABLET | Freq: Four times a day (QID) | ORAL | Status: DC | PRN

## 2024-01-10 MED ORDER — SODIUM CHLORIDE 0.9 % IV SOLN
500.0000 mg | Freq: Once | INTRAVENOUS | Status: AC
  Administered 2024-01-10: 500 mg via INTRAVENOUS
  Filled 2024-01-10: qty 5

## 2024-01-10 MED ORDER — ORAL CARE MOUTH RINSE: 15.0000 mL | OROMUCOSAL | Status: DC | PRN

## 2024-01-10 MED ORDER — ONDANSETRON HCL 4 MG PO TABS: 4.0000 mg | ORAL_TABLET | Freq: Four times a day (QID) | ORAL | Status: DC | PRN

## 2024-01-10 MED ORDER — SODIUM CHLORIDE 0.9 % IV SOLN
500.0000 mg | INTRAVENOUS | Status: DC
  Filled 2024-01-10: qty 5

## 2024-01-10 MED ORDER — FUROSEMIDE 20 MG PO TABS
20.0000 mg | ORAL_TABLET | Freq: Every day | ORAL | Status: DC
  Administered 2024-01-11 – 2024-01-13 (×3): 20 mg via ORAL
  Filled 2024-01-10 (×3): qty 1

## 2024-01-10 MED ORDER — IOHEXOL 350 MG/ML SOLN
75.0000 mL | Freq: Once | INTRAVENOUS | Status: AC | PRN
  Administered 2024-01-10: 75 mL via INTRAVENOUS

## 2024-01-10 MED ORDER — METHENAMINE MANDELATE 0.5 G PO TABS
1.0000 g | ORAL_TABLET | Freq: Two times a day (BID) | ORAL | Status: DC
  Administered 2024-01-11 – 2024-01-13 (×5): 1 g via ORAL
  Filled 2024-01-10 (×6): qty 2

## 2024-01-10 MED ORDER — POLYETHYLENE GLYCOL 3350 17 G PO PACK
17.0000 g | PACK | Freq: Two times a day (BID) | ORAL | Status: DC
  Administered 2024-01-11 – 2024-01-12 (×3): 17 g via ORAL
  Filled 2024-01-10 (×5): qty 1

## 2024-01-10 MED ORDER — ACETAMINOPHEN 325 MG PO TABS: 650.0000 mg | ORAL_TABLET | Freq: Four times a day (QID) | ORAL | Status: DC | PRN

## 2024-01-10 MED ORDER — IPRATROPIUM BROMIDE 0.02 % IN SOLN
0.5000 mg | Freq: Three times a day (TID) | RESPIRATORY_TRACT | Status: DC | PRN
  Administered 2024-01-12: 07:00:00 0.5 mg via RESPIRATORY_TRACT
  Filled 2024-01-10 (×2): qty 2.5

## 2024-01-10 NOTE — Assessment & Plan Note (Deleted)
 On tramadol  as needed. Will continue here.

## 2024-01-10 NOTE — ED Provider Notes (Signed)
 [facility] Provider Note    Event Date/Time   First MD Initiated Contact with Patient 01/10/24 1036     (approximate)   History   Shortness of Breath   HPI  Monica Singh is a 89 y.o. female with a history of dementia, COPD, CHF, chronic kidney disease presents from nursing facility with shortness of breath, room air saturations in the high 80s, started on 2 L nasal cannula, EMS gave nebulizer  She does have a productive cough     Physical Exam   Triage Vital Signs: ED Triage Vitals  Encounter Vitals Group     BP 01/10/24 1036 (!) 205/186     Girls Systolic BP Percentile --      Girls Diastolic BP Percentile --      Boys Systolic BP Percentile --      Boys Diastolic BP Percentile --      Pulse Rate 01/10/24 1036 (!) 112     Resp 01/10/24 1036 (!) 28     Temp 01/10/24 1036 (!) 97.4 F (36.3 C)     Temp Source 01/10/24 1036 Oral     SpO2 01/10/24 1036 100 %     Weight 01/10/24 1042 58.8 kg (129 lb 11.2 oz)     Height 01/10/24 1042 1.6 m (5' 3)     Head Circumference --      Peak Flow --      Pain Score 01/10/24 1042 0     Pain Loc --      Pain Education --      Exclude from Growth Chart --     Most recent vital signs: Vitals:   01/10/24 1258 01/10/24 1313  BP: (!) 95/48 (!) 101/54  Pulse: (!) 105 (!) 102  Resp: (!) 22 (!) 23  Temp: 97.6 F (36.4 C) 98.4 F (36.9 C)  SpO2: 96% 98%     General: Awake,  CV:  Good peripheral perfusion.  Tachycardia Resp:  Mild tachypnea, bibasilar rales Abd:  No distention.  Soft, nontender Other:  Mild lower extremity edema, no tenderness to palpation   ED Results / Procedures / Treatments   Labs (all labs ordered are listed, but only abnormal results are displayed) Labs Reviewed  CBC - Abnormal; Notable for the following components:      Result Value   RBC 3.59 (*)    Hemoglobin 10.1 (*)    HCT 31.9 (*)    All other components within normal limits  COMPREHENSIVE METABOLIC  PANEL WITH GFR - Abnormal; Notable for the following components:   Sodium 134 (*)    Chloride 95 (*)    Glucose, Bld 133 (*)    Calcium  8.6 (*)    GFR, Estimated 59 (*)    All other components within normal limits  PRO BRAIN NATRIURETIC PEPTIDE - Abnormal; Notable for the following components:   Pro Brain Natriuretic Peptide 1,164.0 (*)    All other components within normal limits  LACTIC ACID, PLASMA - Abnormal; Notable for the following components:   Lactic Acid, Venous 2.9 (*)    All other components within normal limits  TROPONIN T, HIGH SENSITIVITY - Abnormal; Notable for the following components:   Troponin T High Sensitivity 83 (*)    All other components within normal limits  RESP PANEL BY RT-PCR (RSV, FLU A&B, COVID)  [id number]  CULTURE, BLOOD (ROUTINE X 2)  CULTURE, BLOOD (ROUTINE X 2)  LACTIC ACID, PLASMA  URINALYSIS, COMPLETE (UACMP) WITH MICROSCOPIC  MAGNESIUM   TROPONIN T, HIGH SENSITIVITY     EKG  ED ECG REPORT I, Lamar Price, the attending physician, personally viewed and interpreted this ECG.  Date: 01/10/2024  Rhythm: Sinus tachycardia QRS Axis: normal Intervals: normal ST/T Wave abnormalities: normal Narrative Interpretation: Nonspecific change    RADIOLOGY Chest x-ray with possible nodule, no other acute abnormality    PROCEDURES:  Critical Care performed: yes  CRITICAL CARE Performed by: Lamar Price   Total critical care time: 30 minutes  Critical care time was exclusive of separately billable procedures and treating other patients.  Critical care was necessary to treat or prevent imminent or life-threatening deterioration.  Critical care was time spent personally by me on the following activities: development of treatment plan with patient and/or surrogate as well as nursing, discussions with consultants, evaluation of patient's response to treatment, examination of patient, obtaining history from patient or surrogate, ordering and  performing treatments and interventions, ordering and review of laboratory studies, ordering and review of radiographic studies, pulse oximetry and re-evaluation of patient's condition.   Procedures   MEDICATIONS ORDERED IN ED: Medications  cefTRIAXone  (ROCEPHIN ) 2 g in sodium chloride  0.9 % 100 mL IVPB (2 g Intravenous New Bag/Given 01/10/24 1324)  azithromycin  (ZITHROMAX ) 500 mg in sodium chloride  0.9 % 250 mL IVPB (500 mg Intravenous New Bag/Given 01/10/24 1326)  sodium chloride  0.9 % bolus 1,000 mL (1,000 mLs Intravenous New Bag/Given 01/10/24 1315)    And  sodium chloride  0.9 % bolus 1,000 mL (1,000 mLs Intravenous New Bag/Given 01/10/24 1317)  sodium chloride  flush (NS) 0.9 % injection 3 mL (3 mLs Intravenous Not Given 01/10/24 1328)  acetaminophen  (TYLENOL ) tablet 650 mg (has no administration in time range)    Or  acetaminophen  (TYLENOL ) suppository 650 mg (has no administration in time range)  senna-docusate (Senokot-S) tablet 1 tablet (has no administration in time range)  heparin  injection 5,000 Units (has no administration in time range)  ondansetron  (ZOFRAN ) tablet 4 mg (has no administration in time range)    Or  ondansetron  (ZOFRAN ) injection 4 mg (has no administration in time range)  ipratropium-albuterol  (DUONEB) 0.5-2.5 (3) MG/3ML nebulizer solution 3 mL (3 mLs Nebulization Given 01/10/24 1111)  iohexol  (OMNIPAQUE ) 350 MG/ML injection 75 mL (75 mLs Intravenous Contrast Given 01/10/24 1221)     IMPRESSION / MDM / ASSESSMENT AND PLAN / ED COURSE  I reviewed the triage vital signs and the nursing notes. Patient's presentation is most consistent with acute presentation with potential threat to life or bodily function.  Patient presents with shortness of breath, tachycardia, hypoxia.  Differential includes COPD, CHF, pneumonia.  She has a significant productive cough and is hypertensive, suspicious for CHF  Pending labs, chest x-ray, will give an additional DuoNeb as  well.  Lab work demonstrates elevated lactic acid, elevated high sensitive troponin, elevated BNP, normal white blood cell count.  She is tachycardic, will send for CT angiography to evaluate for PE  ----------------------------------------- 1:01 PM on 01/10/2024 ----------------------------------------- CT scan suspicious for pneumonia, recheck blood pressure is borderline, will start 30 mL/kg, code sepsis activated, IV antibiotics ordered     ED Sepsis - Repeat Assessment   Performed at:    1:15 pm  Last Vitals:    Blood pressure (!) 95/48, pulse (!) 105, temperature 97.6 F (36.4 C), temperature source Oral, resp. rate (!) 22, height 1.6 m (5' 3), weight 58.8 kg, SpO2 96%.  Heart:      Tachycardia  Lungs:     Bibasilar rales  Capillary Refill:   Less than 2  Peripheral Pulse (include location): 2+ pulses right radial   Skin (include color):   Pink       FINAL CLINICAL IMPRESSION(S) / ED DIAGNOSES   Final diagnoses:  Severe sepsis (HCC)     Rx / DC Orders   ED Discharge Orders     None        Note:  This document was prepared using Dragon voice recognition software and may include unintentional dictation errors.   Arlander Charleston, MD 01/10/24 [phone]

## 2024-01-10 NOTE — Progress Notes (Signed)
eLINK following for sepsis protocol. ?

## 2024-01-10 NOTE — Assessment & Plan Note (Addendum)
 Monica Singh

## 2024-01-10 NOTE — Assessment & Plan Note (Deleted)
 Chronic issue for patient, will continue laxatives as needed.

## 2024-01-10 NOTE — Progress Notes (Signed)
 CODE SEPSIS - PHARMACY COMMUNICATION  **Broad Spectrum Antibiotics should be administered within 1 hour of Sepsis diagnosis**  Time Code Sepsis Called/Page Received: 12:53  Antibiotics Ordered: Ceftriaxone , azithromycin   Time of 1st antibiotic administration: 13:24  Additional action taken by pharmacy: N/A  If necessary, Name of Provider/Nurse Contacted: N/A   Ransom Blanch PGY-1 Pharmacy Resident  [facility]  01/10/2024 12:54 PM

## 2024-01-10 NOTE — H&P (Addendum)
 History and Physical    Monica Singh  DESMOND TUFANO FMW:[id number] DOB: January 23, 1929 DOA: 01/10/2024  DOS: the patient was seen and examined on 01/10/2024  PCP: [facility]   Patient coming from: Home  I have personally briefly reviewed patient's old medical records in [facility] and CareEverywhere  HPI:   Monica  H Singh is a 89 y.o. year old female with medical history of HTN, COPD, CHF (EF 60 % in 05/2023 with G1DD), DDD, advanced dementia coming to the ED with from memory care units for shortness of breath and coughing.    Pt is from [facility] in the memory care unit. I tried to contact the facility x3 but no success.  Patient is alert and oriented to self only.  This is her baseline.    Patient's daughter at bedside states patient has been becoming somnolent since yesterday.  She was concerned patient was having urinary tract infection and advised that nursing staff at the facility to check her urine.  They collected the urine but stated they will have to wait till Monday to get the results back as they are unable to get back prior to this.  Daughter reports patient has not complained of any other concerns.  She has been having decreasing appetite over the last several weeks.   On arrival to the ED patient was noted to be HDS stable.  Lab work and imaging obtained.  CBC without leukocytosis, mild anemia at 10.1 that is normocytic, CMP with baseline renal function and mild hyponatremia.  Troponin mildly elevated at 83 with repeat pending.  BNP mildly elevated at 1164.  Lactic acid elevated at 2.9 with repeat pending.  Respiratory panel negative for COVID, flu, RSV.  CTA without PE but did show patchy opacity in the right upper lobe with concern for infectious etiology.  There was partial visualization of left kidney which showed some fat stranding and slight hydronephrosis.  Patient has history of obstruction of left ureteropelvic junction with stent placement.  Given  her hypoxia with concern for pneumonia, code sepsis initiated by EDP.  Given this, TRH contacted for admission.  Review of Systems: As mentioned in the history of present illness. All other systems reviewed and are negative.   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   NON-HODGKINS-stage 3, cd 20 positive; status post 6 cycles of R-CHOP   DNR (do not resuscitate)    Dyspnea    with exertion   Essential hypertension 04/10/2022   Herpes zoster without complication 01/31/2009   History of hiatal hernia    HOH (hard of hearing)    wears hearing aides   Hypothyroidism    Mitral regurgitation    Non Hodgkin's lymphoma (HCC)    Nursing home resident    Obstruction of left ureteropelvic junction (UPJ)    Personal history of chemotherapy    PONV (postoperative nausea and vomiting)    in January lasted about 4 hours   Recurrent UTI    Squamous cell carcinoma of skin 11/09/2008   Right post. lat. elbow. SCCis arising in AK. Excised 01/04/2009, margins free.    Squamous cell carcinoma of skin 07/26/2020  right hand   Syncope     Past Surgical History:  Procedure Laterality Date   ABDOMINAL SURGERY  2009   abdominal mass+ NH lymphoma,   BACK SURGERY  01/2017   fusion. metal plate in neck at back   CATARACT EXTRACTION  1997   right eye and left ey   cervical neck fusion  1995   CYSTOSCOPY W/ RETROGRADES Left 01/30/2018   Procedure: CYSTOSCOPY WITH RETROGRADE PYELOGRAM;  Surgeon: Francisca Redell BROCKS, MD;  Location: [facility];  Service: Urology;  Laterality: Left;   CYSTOSCOPY W/ RETROGRADES Left 08/05/2018   Procedure: CYSTOSCOPY WITH RETROGRADE PYELOGRAM;  Surgeon: Francisca Redell BROCKS, MD;  Location: [facility];   Service: Urology;  Laterality: Left;   CYSTOSCOPY W/ RETROGRADES Left 06/01/2021   Procedure: CYSTOSCOPY WITH RETROGRADE PYELOGRAM;  Surgeon: Francisca Redell BROCKS, MD;  Location: [facility];  Service: Urology;  Laterality: Left;   CYSTOSCOPY W/ URETERAL STENT PLACEMENT Left 08/05/2018   Procedure: CYSTOSCOPY WITH STENT Exchange;  Surgeon: Francisca Redell BROCKS, MD;  Location: [facility];  Service: Urology;  Laterality: Left;   CYSTOSCOPY W/ URETERAL STENT PLACEMENT Left 05/21/2019   Procedure: CYSTOSCOPY WITH STENT REPLACEMENT;  Surgeon: Francisca Redell BROCKS, MD;  Location: [facility];  Service: Urology;  Laterality: Left;   CYSTOSCOPY W/ URETERAL STENT PLACEMENT Left 05/05/2020   Procedure: CYSTOSCOPY WITH RETROGRADE PYELOGRAM/URETERAL STENT EXCHANGE;  Surgeon: Francisca Redell BROCKS, MD;  Location: [facility];  Service: Urology;  Laterality: Left;   CYSTOSCOPY W/ URETERAL STENT PLACEMENT Left 06/01/2021   Procedure: CYSTOSCOPY WITH STENT EXCHANGE;  Surgeon: Francisca Redell BROCKS, MD;  Location: [facility];  Service: Urology;  Laterality: Left;   CYSTOSCOPY W/ URETERAL STENT PLACEMENT Left 05/30/2023   Procedure: CYSTOSCOPY, FLEXIBLE, WITH STENT REPLACEMENT;  Surgeon: Francisca Redell BROCKS, MD;  Location: [facility];  Service: Urology;  Laterality: Left;   CYSTOSCOPY WITH BIOPSY Left 02/27/2018   Procedure: CYSTOSCOPY WITH BIOPSY;  Surgeon: Francisca Redell BROCKS, MD;  Location: [facility];  Service: Urology;  Laterality: Left;   CYSTOSCOPY WITH STENT PLACEMENT Left 01/30/2018   Procedure: CYSTOSCOPY WITH STENT PLACEMENT;  Surgeon: Francisca Redell BROCKS, MD;  Location: [facility];  Service: Urology;  Laterality: Left;   CYSTOSCOPY WITH STENT PLACEMENT Left 07/12/2022   Procedure: CYSTOSCOPY WITH STENT EXCHANGE;  Surgeon: Francisca Redell BROCKS, MD;  Location: [facility];  Service: Urology;  Laterality: Left;   CYSTOSCOPY WITH URETEROSCOPY AND STENT PLACEMENT Left 02/27/2018   Procedure: CYSTOSCOPY WITH URETEROSCOPY AND STENT Exchange;  Surgeon: Francisca Redell BROCKS, MD;   Location: [facility];  Service: Urology;  Laterality: Left;   DENTAL SURGERY     screws   IR KYPHO THORACIC WITH BONE BIOPSY  06/24/2023   JOINT REPLACEMENT Left    knee   KNEE ARTHROPLASTY Right 12/23/2018   Procedure: RIGHT COMPUTER ASSISTED TOTAL KNEE ARTHROPLASTY;  Surgeon: Mardee Lynwood SQUIBB, MD;  Location: [facility];  Service: Orthopedics;  Laterality: Right;   KYPHOPLASTY N/A 02/21/2017   Procedure: [id number];  Surgeon: Kathlynn Sharper, MD;  Location: [facility];  Service: Orthopedics;  Laterality: N/A;   laparotomy with biopsy  03/02/2007   PORTACATH PLACEMENT  2009   SPINE SURGERY  1997   SQUAMOUS CELL CARCINOMA EXCISION     right arm   TOTAL KNEE ARTHROPLASTY Left    2009   URETEROSCOPY Left 01/30/2018   Procedure: URETEROSCOPY;  Surgeon: Francisca Redell BROCKS, MD;  Location: [facility];  Service: Urology;  Laterality: Left;   VAGINAL HYSTERECTOMY  1971  Allergies[1]  Family History  Problem Relation Age of Onset   Breast cancer Sister    Dementia Sister    Cataracts Sister    Heart attack Brother    CAD Brother    Heart attack Brother    Leukemia Grandchild        granddaughter   Kidney disease Neg Hx    Bladder Cancer Neg Hx     Prior to Admission medications  Medication Sig Start Date End Date Taking? Authorizing Provider  acetaminophen  (TYLENOL ) 325 MG tablet Take 2 tablets (650 mg total) by mouth every 6 (six) hours. 06/26/23   Fausto Burnard LABOR, DO  albuterol  (VENTOLIN  HFA) 108 (90 Base) MCG/ACT inhaler Inhale 2 puffs into the lungs every 6 (six) hours as needed for wheezing or shortness of breath. Patient not taking: Reported on 06/19/2023 10/04/22   Isadora Hose, MD  benzonatate  (TESSALON ) 100 MG capsule Take 1 capsule (100 mg total) by mouth 2 (two) times daily as needed for cough. 06/17/23   Caleen Qualia, MD  Budeson-Glycopyrrol-Formoterol  (BREZTRI  AEROSPHERE) 160-9-4.8 MCG/ACT AERO Inhale 2 puffs into the lungs in the morning and at bedtime. 07/01/22   Isadora Hose,  MD  cholecalciferol  (VITAMIN D3) 25 MCG (1000 UNIT) tablet Take 1,000 Units by mouth daily.    [provider]  ferrous sulfate  325 (65 FE) MG tablet Take 325 mg by mouth daily with breakfast.    [provider]  furosemide  (LASIX ) 20 MG tablet Take 1 tablet (20 mg total) by mouth daily for 3 days. 07/21/23 07/24/23  Clarine Ozell LABOR, MD  guaiFENesin  (MUCINEX ) 600 MG 12 hr tablet Take 2 tablets (1,200 mg total) by mouth 2 (two) times daily as needed for to loosen phlegm or cough. 06/17/23   Amin, Sumayya, MD  ipratropium (ATROVENT ) 0.02 % nebulizer solution Take 0.5 mg by nebulization every 8 (eight) hours as needed for shortness of breath (cough).    [provider]  methenamine  (HIPREX ) 1 g tablet TAKE ONE (1) TABLET BY MOUTH TWO TIMES PER DAY 11/14/22   McGowan, Clotilda A, [name]  oxyCODONE  (OXY IR/ROXICODONE ) 5 MG immediate release tablet Take 1 tablet (5 mg total) by mouth every 6 (six) hours as needed for severe pain (pain score 7-10). 06/26/23   Fausto Burnard A, DO  polyethylene glycol (MIRALAX  / GLYCOLAX ) 17 g packet Take 17 g by mouth 2 (two) times daily. 06/26/23   Fausto Burnard LABOR, DO  predniSONE  (DELTASONE ) 10 MG tablet Take 1 tablet (10 mg total) by mouth daily with breakfast. 06/27/23   Fausto Burnard A, DO  psyllium (REGULOID) 0.52 g capsule Take 0.52 g by mouth daily.    [provider]  senna-docusate (SENOKOT-S) 8.6-50 MG tablet Take 1 tablet by mouth daily.    [provider]  sodium phosphate  (FLEET) ENEM Place 133 mLs (1 enema total) rectally daily as needed for severe constipation (if no BM in over 48 hours). 06/26/23   Fausto Burnard LABOR, DO  Spacer/Aero-Holding Chambers (AEROCHAMBER MV) inhaler Use as instructed 10/04/22   Isadora Hose, MD  traMADol  (ULTRAM ) 50 MG tablet Take 1 tablet (50 mg total) by mouth every 6 (six) hours as needed. 06/26/23 06/25/24  Fausto Burnard LABOR, DO    Social History:  reports that she quit smoking about 34  years ago. Her smoking use included cigarettes. She smoked an average of 0.3 packs per day. She has been exposed to tobacco smoke. She has never used smokeless tobacco. She reports that she  does not currently use alcohol. She reports that she does not use drugs.    Physical Exam: Vitals:   01/10/24 1036 01/10/24 1042 01/10/24 1258 01/10/24 1313  BP: (!) 205/186  (!) 95/48 (!) 101/54  Pulse: (!) 112  (!) 105 (!) 102  Resp: (!) 28  (!) 22 (!) 23  Temp: (!) 97.4 F (36.3 C)  97.6 F (36.4 C) 98.4 F (36.9 C)  TempSrc: Oral  Oral Oral  SpO2: 100%  96% 98%  Weight:  58.8 kg    Height:  5' 3 (1.6 m)      Gen: NAD HENT: NCAT, nasal cannula in place. CV: normal heart sounds Lung: Rhonchi present, lung sounds difficulty.  Given order transmitted for airway sounds. Abd: No TTP, normal bowel sounds MSK: No asymmetry, good bulk and tone Skin: No wounds present on examined skin Neuro: alert but oriented to self only.  Per daughter this is her baseline.   Labs on Admission: I have personally reviewed following labs and imaging studies  CBC: Recent Labs  Lab 01/10/24 1041  WBC 7.6  HGB 10.1*  HCT 31.9*  MCV 88.9  PLT 379   Basic Metabolic Panel: Recent Labs  Lab 01/10/24 1041  NA 134*  K 3.8  CL 95*  CO2 26  GLUCOSE 133*  BUN 22  CREATININE 0.91  CALCIUM  8.6*   GFR: Estimated Creatinine Clearance: 32 mL/min (by C-G formula based on SCr of 0.91 mg/dL). Liver Function Tests: Recent Labs  Lab 01/10/24 1041  AST 17  ALT 10  ALKPHOS 79  BILITOT 0.3  PROT 6.6  ALBUMIN 3.6   No results for input(s): LIPASE, AMYLASE in the last 168 hours. No results for input(s): AMMONIA in the last 168 hours. Coagulation Profile: No results for input(s): INR, PROTIME in the last 168 hours. Cardiac Enzymes: No results for input(s): CKTOTAL, CKMB, CKMBINDEX, TROPONINI, TROPONINIHS in the last 168 hours. BNP (last 3 results) Recent Labs    06/15/23 0408  07/20/23 1037  BNP 311.2* 118.1*   HbA1C: No results for input(s): HGBA1C in the last 72 hours. CBG: No results for input(s): GLUCAP in the last 168 hours. Lipid Profile: No results for input(s): CHOL, HDL, LDLCALC, TRIG, CHOLHDL, LDLDIRECT in the last 72 hours. Thyroid  Function Tests: No results for input(s): TSH, T4TOTAL, FREET4, T3FREE, THYROIDAB in the last 72 hours. Anemia Panel: No results for input(s): VITAMINB12, FOLATE, FERRITIN, TIBC, IRON, RETICCTPCT in the last 72 hours. Urine analysis:    Component Value Date/Time   COLORURINE YELLOW (A) 06/15/2023 0934   APPEARANCEUR CLOUDY (A) 06/15/2023 0934   APPEARANCEUR Cloudy (A) 05/08/2023 1332   LABSPEC 1.006 06/15/2023 0934   LABSPEC 1.010 01/09/2013 0647   PHURINE 6.0 06/15/2023 0934   GLUCOSEU NEGATIVE 06/15/2023 0934   GLUCOSEU Negative 01/09/2013 0647   HGBUR MODERATE (A) 06/15/2023 0934   BILIRUBINUR NEGATIVE 06/15/2023 0934   BILIRUBINUR Negative 05/08/2023 1332   BILIRUBINUR Negative 01/09/2013 0647   KETONESUR NEGATIVE 06/15/2023 0934   PROTEINUR NEGATIVE 06/15/2023 0934   UROBILINOGEN 0.2 04/09/2022 1452   NITRITE POSITIVE (A) 06/15/2023 0934   LEUKOCYTESUR LARGE (A) 06/15/2023 0934   LEUKOCYTESUR 2+ 01/09/2013 0647    Radiological Exams on Admission: I have personally reviewed images CT Angio Chest PE W and/or Wo Contrast Result Date: 01/10/2024 CLINICAL DATA:  Pulmonary embolism (PE) suspected, high prob EXAM: CT ANGIOGRAPHY CHEST WITH CONTRAST TECHNIQUE: Multidetector CT imaging of the chest was performed using the standard protocol during bolus administration of  intravenous contrast. Multiplanar CT image reconstructions and MIPs were obtained to evaluate the vascular anatomy. RADIATION DOSE REDUCTION: This exam was performed according to the departmental dose-optimization program which includes automated exposure control, adjustment of the mA and/or kV according to  patient size and/or use of iterative reconstruction technique. CONTRAST:  75mL OMNIPAQUE  IOHEXOL  350 MG/ML SOLN COMPARISON:  Jun 18, 2023, November 16, 2022, October 08, 2010, it July 01, 2022, cervical spine CT dated April 10, 2022 FINDINGS: Cardiovascular: Heart is mildly enlarged. No pericardial effusion. Three-vessel coronary artery atherosclerotic calcifications. Atherosclerotic calcifications of the nonaneurysmal thoracic aorta. No pulmonary embolism through the proximal subsegmental pulmonary arteries. Mediastinum/Nodes: Tortuous RIGHT subclavian artery. Visualized thyroid  is unremarkable. No axillary adenopathy. Mildly prominent RIGHT hilar lymph nodes measuring 10 mm in the short axis, likely reactive (series 6, image 207). Upper limits of normal LEFT subclavian lymph node measuring 7 mm in the short axis (series 6, image 27). This appears similar compared to the priors. Lungs/Pleura: No pleural effusion or pneumothorax. Moderate centrilobular emphysema. Bronchial wall thickening with scattered areas bronchiectasis. Patchy peripheral ground-glass opacities of the RIGHT upper lobe. Bandlike opacities of the LEFT upper and lower Lobe, likely atelectasis. RIGHT lower lobe pulmonary nodule measures 4 x 2 mm (series 6, image 281). LEFT lower lobe 3 mm pulmonary nodule, stable dating back to 2012 and consistent with a benign etiology (series 6, image 272). Upper Abdomen: Partial visualization of dilation of the LEFT renal collecting system with fat stranding along the LEFT kidney. Overall extent of LEFT-sided hydronephrosis is favored increased since most recent prior in May 2025, but this is at the edge of the field of view. Large hiatal hernia. Musculoskeletal: Revisualization of several compression fracture deformities of the lower thoracic spine status post vertebroplasty. Mild to moderate compression fracture deformity of T10, favored similar compared to both prior radiographs. RIGHT shoulder os acromiale.  Review of the MIP images confirms the above findings. IMPRESSION: 1. No pulmonary embolism through the proximal subsegmental pulmonary arteries. 2. Patchy peripheral ground-glass opacities of the RIGHT upper lobe, likely infectious or inflammatory in etiology. Consider follow-up CT in 3 months to assess for resolution if clinically appropriate. 3. Partial visualization of dilation of the LEFT renal collecting system with fat stranding along the LEFT kidney. Overall extent of LEFT-sided hydronephrosis is favored increased since most recent prior in May 2025, but this is at the edge of the field of view. Recommend correlation with urinalysis and dedicated CT abdomen pelvis if clinically indicated. Aortic Atherosclerosis (ICD10-I70.0) and Emphysema (ICD10-J43.9). Electronically Signed   By: Corean Salter M.D.   On: 01/10/2024 12:49   DG Chest Port 1 View Result Date: 01/10/2024 CLINICAL DATA:  Shortness of breath. EXAM: PORTABLE CHEST - 1 VIEW COMPARISON:  07/20/2023 FINDINGS: Cardiomediastinal silhouette and pulmonary vasculature are within normal limits. Mild atelectasis present at the LEFT lung base. Questionable nodular opacity seen in the RIGHT mid lung measuring 1.5 cm. Lungs are otherwise clear. Moderate size hiatal hernia. Vertebral augmentation changes again seen in the lower thoracic and upper lumbar spine. ACDF changes seen in the lower cervical spine. IMPRESSION: 1. Questionable 1.5 cm nodular opacity seen in the RIGHT mid lung. Recommend dedicated two-view chest radiograph for confirmation. 2. Mild LEFT basilar atelectasis. Electronically Signed   By: Aliene Lloyd M.D.   On: 01/10/2024 11:10    EKG: My personal interpretation of EKG shows: Sinus tachycardia without any acute ST changes.  There is significant artifact on the study.  Will repeat.  Assessment/Plan Principal Problem:   Acute hypoxic respiratory failure (HCC) Active Problems:   Diastolic CHF, chronic (HCC)   COPD (chronic  obstructive pulmonary disease) (HCC)   Frequent UTI   Chronic kidney disease (CKD), stage 3 (HCC)   Essential hypertension   H/O non-Hodgkin's lymphoma   DDD (degenerative disc disease), cervical   DDD (degenerative disc disease), lumbar   Obstruction of left ureteropelvic junction (UPJ)   Advanced dementia (HCC)   Constipation   PNA Pt presented with AHRF 2/2 to pneumonia. Image findings consistent with pneumonia. Blood cultures ordered. Pt is status post ceftriaxone  and azithromycin . -Continue Ceftriaxone  and Azithromycin   -Monitor fever curve, trend WBC -Follow up blood cultures  -Wean oxygen as able -Continuous pluse ox  History of UTI: On methenamine . Will continue here. Getting UA.  There is a possibility patient may have had a UTI that increases problems can lead to aspiration event.  Obstruction of left ureteropelvic junction:  Pt with left UPJ obstruction s/p stent placement by urology. They are changing stents on yearly basis. Pt is to see them outpatient for consideration of future stent change. Will discuss with family regarding repeating imagin bladder exam ordered g given patient's dementia. UA ordered.  Bladder scan ordered.  Interval update: pt's family would like to pursue imaging to see if pt has any worsening hydronephrosis. Will order CTAP with contrast. Based on imaging, may need to get input of urology this admission.   Dementia: Given interaction with patient, it appears to be severe.  Complicating factor for this patient. Delirium precautions placed.   COPD: Patient with shortness of breath but no cough or sputum production.  Not in COPD exacerbation.  Will continue DuoNebs.  Degenerative disc disease: On tramadol  as needed.  Will continue here.  Constipation: Likely secondary to opioid: Will use laxatives as needed.  Monitor her bowel movements as that can lead to delirium in patients with advanced dementia.  CKD 3A: At baseline.  Renally dose meds.  Avoid  nephrotoxic agents.  HFpEF: Not volume overloaded.  Will continue home Lasix .  Hx of DVT: on eliquis . Holding currently. Using heparin  subcutaneous for prophylaxis. Can restart once CTAP results and no surgical interventions needed this admission.   VTE prophylaxis:  SQ Heparin   Diet: Heart healthy Code Status:  DNR/DNI(Do NOT Intubate) Telemetry:  Admission status: Inpatient, Telemetry bed Patient is from: Home Anticipated d/c is to: Home Anticipated d/c is in: 2-3 days   Family Communication: Updated at bedside  Consults called: None   Severity of Illness: The appropriate patient status for this patient is INPATIENT. Inpatient status is judged to be reasonable and necessary in order to provide the required intensity of service to ensure the patient's safety. The patient's presenting symptoms, physical exam findings, and initial radiographic and laboratory data in the context of their chronic comorbidities is felt to place them at high risk for further clinical deterioration. Furthermore, it is not anticipated that the patient will be medically stable for discharge from the hospital within 2 midnights of admission.   * I certify that at the point of admission it is my clinical judgment that the patient will require inpatient hospital care spanning beyond 2 midnights from the point of admission due to high intensity of service, high risk for further deterioration and high frequency of surveillance required.DEWAINE Morene Bathe, MD [facility] DEL. [facility]      [1]  Allergies Allergen Reactions   Ciprofloxacin  Itching    Whelps on face  reported 03/16/2021   Latex Itching   Misc. Sulfonamide Containing Compounds    Sulfa Antibiotics Hives and Itching

## 2024-01-10 NOTE — Assessment & Plan Note (Deleted)
 Complicating factor for this patient. Delirium precautions placed.

## 2024-01-10 NOTE — ED Triage Notes (Signed)
 Pt brought in by EMS from [facility] for SOB. Per EMS, pt was initially hard to arouse. Upon arrival to ED, pt is alert; complaining of shortness of breath. 1 duoneb administered by EMS. Respirations labored; slightly tachy at 23. Pt satting 100% on 2L O2.

## 2024-01-10 NOTE — Assessment & Plan Note (Deleted)
 Pt with left UPJ obstruction s/p stent placement by urology. They are changing stents on yearly basis. Pt is to see them outpatient for consideration of future stent change. Will discuss with family regarding repeating imaging given patient's dementia. UA ordered.

## 2024-01-10 NOTE — Assessment & Plan Note (Deleted)
 On methenamine . Will continue here. Getting UA.

## 2024-01-11 DIAGNOSIS — A419 Sepsis, unspecified organism: Secondary | ICD-10-CM | POA: Diagnosis not present

## 2024-01-11 DIAGNOSIS — J449 Chronic obstructive pulmonary disease, unspecified: Secondary | ICD-10-CM | POA: Diagnosis present

## 2024-01-11 DIAGNOSIS — R6520 Severe sepsis without septic shock: Secondary | ICD-10-CM

## 2024-01-11 DIAGNOSIS — J9601 Acute respiratory failure with hypoxia: Secondary | ICD-10-CM | POA: Diagnosis present

## 2024-01-11 DIAGNOSIS — N390 Urinary tract infection, site not specified: Secondary | ICD-10-CM | POA: Diagnosis not present

## 2024-01-11 DIAGNOSIS — Z7189 Other specified counseling: Secondary | ICD-10-CM | POA: Diagnosis not present

## 2024-01-11 DIAGNOSIS — I5032 Chronic diastolic (congestive) heart failure: Secondary | ICD-10-CM | POA: Diagnosis present

## 2024-01-11 LAB — BASIC METABOLIC PANEL WITH GFR
Anion gap: 12 (ref 5–15)
BUN: 17 mg/dL (ref 8–23)
CO2: 25 mmol/L (ref 22–32)
Calcium: 8.1 mg/dL — ABNORMAL LOW (ref 8.9–10.3)
Chloride: 99 mmol/L (ref 98–111)
Creatinine, Ser: 0.77 mg/dL (ref 0.44–1.00)
GFR, Estimated: >60 mL/min (ref >60)
Glucose, Bld: 90 mg/dL (ref 70–99)
Potassium: 3.9 mmol/L (ref 3.5–5.1)
Sodium: 136 mmol/L (ref 135–145)

## 2024-01-11 LAB — CBC
HCT: 28.8 % — ABNORMAL LOW (ref 36.0–46.0)
Hemoglobin: 9.1 g/dL — ABNORMAL LOW (ref 12.0–15.0)
MCH: 27.8 pg (ref 26.0–34.0)
MCHC: 31.6 g/dL (ref 30.0–36.0)
MCV: 88.1 fL (ref 80.0–100.0)
Platelets: 371 K/uL (ref 150–400)
RBC: 3.27 MIL/uL — ABNORMAL LOW (ref 3.87–5.11)
RDW: 14.7 % (ref 11.5–15.5)
WBC: 5.7 K/uL (ref 4.0–10.5)
nRBC: 0 % (ref 0.0–0.2)

## 2024-01-11 LAB — GLUCOSE, CAPILLARY: Glucose-Capillary: 109 mg/dL — ABNORMAL HIGH (ref 70–99)

## 2024-01-11 MED ORDER — GUAIFENESIN-DM 100-10 MG/5ML PO SYRP
5.0000 mL | ORAL_SOLUTION | ORAL | Status: DC | PRN
  Administered 2024-01-11 – 2024-01-13 (×4): 5 mL via ORAL
  Filled 2024-01-11 (×4): qty 10

## 2024-01-11 MED ORDER — AZITHROMYCIN 250 MG PO TABS
500.0000 mg | ORAL_TABLET | Freq: Every day | ORAL | Status: AC
  Administered 2024-01-11 – 2024-01-12 (×2): 500 mg via ORAL
  Filled 2024-01-11 (×2): qty 2

## 2024-01-11 NOTE — Progress Notes (Signed)
 OT Cancellation Note  Patient Details Name: Monica Singh MRN: [id number] DOB: 1928-06-12   Cancelled Treatment:    Reason Eval/Treat Not Completed: Other (comment). OT order received. Chart reviewed. Per conversation with care team, pt currently pending palliative consult. Family to determine GOC later this date. Will hold at this time and re-attempt once GOC established.   Jhonny Pelton, M.S., OTR/L 01/11/2024, 12:31 PM

## 2024-01-11 NOTE — Progress Notes (Signed)
 Upon arrival Monica Singh  was able to respond to her name. She was able to affirm she is ready for heaven. For the rest of the visit her eyes were closed with secretion noise the entire visit. Spoke with second oldest daughter who lives in [place], [place]. She shared the 5 siblings are involved in R[place]y 's life. Teresea  has had 2 years of decline health, cycling through different events. This daughter is trying to accept that this might be the last cycle. I offered EOL education. Prayer was offered with [name] . She has a [address] and [employer] background. Spoke to [place], [name] NP.     01/11/24 1700  Spiritual Encounters  Type of Visit Initial  Care provided to: Pt and family  Conversation partners present during encounter Nurse  Referral source Nurse (RN/NT/LPN)  Reason for visit Goals of care meeting  OnCall Visit Yes

## 2024-01-11 NOTE — Progress Notes (Signed)
 PT Cancellation Note  Patient Details Name: KRISLYNN GRONAU MRN: [id number] DOB: May 18, 1928   Cancelled Treatment:    Reason Eval/Treat Not Completed: Other (comment) Chart reviewed, pt minimally interactive, spoke with family.  Pt with palliative consult and family requests to hold PT at this point until goals of care discussion can be had.  Will continue to follow from a distance and  proceed as appropriate per palliative/family discussion.  Carmin JONELLE Deed 01/11/2024, 12:32 PM

## 2024-01-11 NOTE — IPAL (Addendum)
°  Interdisciplinary Goals of Care Family Meeting   Date carried out: 01/11/2024  Location of the meeting: Conference room/[place]   Member's involved: Physician and Family Member or next of kin  Durable Power of Attorney or environmental health practitioner: 3 daughters    Discussion: We discussed goals of care for Monica Singh .    Code status:   Code Status: Limited: Do not attempt resuscitation (DNR) -DNR-LIMITED -Do Not Intubate/DNI    Disposition: Home with [facility] with Hospice vs IPU  Time spent for the meeting: 35 mins    Cresencio Fairly, MD  01/11/2024, 5:23 PM

## 2024-01-11 NOTE — Progress Notes (Signed)
 PROGRESS NOTE    Monica Singh  Monica Singh  FMW:[id number] DOB: [birth year] DOA: 01/10/2024 PCP: [facility]    Brief Narrative:    89 y.o. year old female with medical history of HTN, COPD, CHF (EF 60 % in 05/2023 with G1DD), DDD, advanced dementia coming to the ED with from memory care units for shortness of breath and coughing.   12/14: Palliative care c/s   Assessment & Plan:   Principal Problem:   Acute hypoxic respiratory failure (HCC) Active Problems:   Diastolic CHF, chronic (HCC)   COPD (chronic obstructive pulmonary disease) (HCC)   Frequent UTI   Chronic kidney disease (CKD), stage 3 (HCC)   Essential hypertension   H/O non-Hodgkin's lymphoma   DDD (degenerative disc disease), cervical   DDD (degenerative disc disease), lumbar   Goals of care, counseling/discussion   Severe sepsis (HCC)   Obstruction of left ureteropelvic junction (UPJ)   Advanced dementia (HCC)   Constipation   PNA Pt presented with AHRF 2/2 to pneumonia. Image findings consistent with pneumonia. Blood cultures neg.  -Continue Ceftriaxone  and Azithromycin   -Monitor fever curve, trend WBC -Wean oxygen as able   History of UTI: continue methenamine . UA worrisome for UTI, urine c/s pending Obstruction of left ureteropelvic junction:  Pt with left UPJ obstruction s/p stent placement by urology. They are changing stents on yearly basis. Pt is to see them outpatient for consideration of future stent change.  - hold off CT based on family meeting, they're agreeable   Dementia: Given interaction with patient, it appears to be severe.  Complicating factor for this patient. Delirium precautions placed.    COPD: Patient with shortness of breath but no cough or sputum production.  Not in COPD exacerbation. continue DuoNebs.   Degenerative disc disease: On tramadol  as needed.  Will continue here.   Constipation: Likely secondary to opioid:  continue laxatives as needed.  Monitor her bowel  movements as that can lead to delirium in patients with advanced dementia.   CKD 3A: At baseline.  Renally dose meds.  Avoid nephrotoxic agents.   HFpEF: Not volume overloaded.  ontinue home Lasix .   Hx of DVT: on eliquis . Holding currently. Using heparin  subcutaneous for prophylaxis.    DVT prophylaxis: (Heparin  heparin  injection 5,000 Units Start: 01/10/24 1400     Code Status: DNR Family Communication: 3 daughters updated in person Disposition Plan: (possible DC in 1-2 days depending on clinical condition and Hospice eval   Consultants:  Palliative care    Antimicrobials:  Rocephin  Zithromax  Methenamine     Subjective:  Pleasantly confused. Daughter at bedside  Objective: Vitals:   01/11/24 0500 01/11/24 0755 01/11/24 1302 01/11/24 1644  BP:  132/62 (!) 127/52 (!) 121/54  Pulse:  (!) 107 (!) 110 (!) 105  Resp:  16 (!) 21 20  Temp:  97.8 F (36.6 C) 99.8 F (37.7 C) 100 F (37.8 C)  TempSrc:   Oral   SpO2:  93% 93% 94%  Weight: 58.8 kg     Height:        Intake/Output Summary (Last 24 hours) at 01/11/2024 1732 Last data filed at 01/11/2024 1500 Gross per 24 hour  Intake 2450 ml  Output 650 ml  Net 1800 ml   Filed Weights   01/10/24 1042 01/11/24 0500  Weight: 58.8 kg 58.8 kg    Examination:  General exam: Appears calm and comfortable  Respiratory system: rhochi at bases b/l Cardiovascular system: S1 & S2 heard, RRR. No JVD, murmurs, rubs,  gallops or clicks. No pedal edema. Gastrointestinal system: Abdomen is soft, benign Central nervous system: Alert and awake, pleasantly confused No focal neurological deficits. Extremities: Symmetric 5 x 5 power. Skin: No rashes, lesions or ulcers     Data Reviewed: I have personally reviewed following labs and imaging studies  CBC: Recent Labs  Lab 01/10/24 1041 01/11/24 0351  WBC 7.6 5.7  HGB 10.1* 9.1*  HCT 31.9* 28.8*  MCV 88.9 88.1  PLT 379 371   Basic Metabolic Panel: Recent Labs  Lab  01/10/24 1041 01/10/24 1728 01/11/24 0351  NA 134*  --  136  K 3.8  --  3.9  CL 95*  --  99  CO2 26  --  25  GLUCOSE 133*  --  90  BUN 22  --  17  CREATININE 0.91  --  0.77  CALCIUM  8.6*  --  8.1*  MG  --  2.0  --    GFR: Estimated Creatinine Clearance: 36.3 mL/min (by C-G formula based on SCr of 0.77 mg/dL). Liver Function Tests: Recent Labs  Lab 01/10/24 1041  AST 17  ALT 10  ALKPHOS 79  BILITOT 0.3  PROT 6.6  ALBUMIN 3.6    BNP (last 3 results) Recent Labs    01/10/24 1041  PROBNP 1,164.0*   HbA1C: No results for input(s): HGBA1C in the last 72 hours. CBG: Recent Labs  Lab 01/11/24 0816  GLUCAP 109*   Lipid Profile: No results for input(s): CHOL, HDL, LDLCALC, TRIG, CHOLHDL, LDLDIRECT in the last 72 hours. Thyroid  Function Tests: No results for input(s): TSH, T4TOTAL, FREET4, T3FREE, THYROIDAB in the last 72 hours. Anemia Panel: No results for input(s): VITAMINB12, FOLATE, FERRITIN, TIBC, IRON, RETICCTPCT in the last 72 hours. Sepsis Labs: Recent Labs  Lab 01/10/24 1041 01/10/24 1310  LATICACIDVEN 2.9* 1.4    Recent Results (from the past 240 hours)  Blood culture (routine x 2)     Status: None (Preliminary result)   Collection Time: 01/10/24 10:42 AM   Specimen: BLOOD  Result Value Ref Range Status   Specimen Description BLOOD LEFT ANTECUBITAL  Final   Special Requests   Final    BOTTLES DRAWN AEROBIC AND ANAEROBIC Blood Culture results may not be optimal due to an inadequate volume of blood received in culture bottles   Culture   Final    NO GROWTH < 24 HOURS Performed at [facility], [address], [place], [place] [address]    Report Status PENDING  Incomplete  Resp panel by RT-PCR (RSV, Flu A&B, Covid) Anterior Nasal Swab     Status: None   Collection Time: 01/10/24 10:42 AM   Specimen: Anterior Nasal Swab  Result Value Ref Range Status   SARS Coronavirus 2 by RT PCR NEGATIVE NEGATIVE  Final    Comment: (NOTE) SARS-CoV-2 target nucleic acids are NOT DETECTED.  The SARS-CoV-2 RNA is generally detectable in upper respiratory specimens during the acute phase of infection. The lowest concentration of SARS-CoV-2 viral copies this assay can detect is 138 copies/mL. A negative result does not preclude SARS-Cov-2 infection and should not be used as the sole basis for treatment or other patient management decisions. A negative result may occur with  improper specimen collection/handling, submission of specimen other than nasopharyngeal swab, presence of viral mutation(s) within the areas targeted by this assay, and inadequate number of viral copies(<138 copies/mL). A negative result must be combined with clinical observations, patient history, and epidemiological information. The expected result is Negative.  Fact Sheet for Patients:  bloggercourse.com  Fact Sheet for Healthcare Providers:  seriousbroker.it  This test is no t yet approved or cleared by the [place]  FDA and  has been authorized for detection and/or diagnosis of SARS-CoV-2 by FDA under an Emergency Use Authorization (EUA). This EUA will remain  in effect (meaning this test can be used) for the duration of the COVID-19 declaration under Section 564(b)(1) of the Act, 21 U.S.C.section 360bbb-3(b)(1), unless the authorization is terminated  or revoked sooner.       Influenza A by PCR NEGATIVE NEGATIVE Final   Influenza B by PCR NEGATIVE NEGATIVE Final    Comment: (NOTE) The Xpert Xpress SARS-CoV-2/FLU/RSV plus assay is intended as an aid in the diagnosis of influenza from Nasopharyngeal swab specimens and should not be used as a sole basis for treatment. Nasal washings and aspirates are unacceptable for Xpert Xpress SARS-CoV-2/FLU/RSV testing.  Fact Sheet for Patients: bloggercourse.com  Fact Sheet for Healthcare  Providers: seriousbroker.it  This test is not yet approved or cleared by the [place]  FDA and has been authorized for detection and/or diagnosis of SARS-CoV-2 by FDA under an Emergency Use Authorization (EUA). This EUA will remain in effect (meaning this test can be used) for the duration of the COVID-19 declaration under Section 564(b)(1) of the Act, 21 U.S.C. section 360bbb-3(b)(1), unless the authorization is terminated or revoked.     Resp Syncytial Virus by PCR NEGATIVE NEGATIVE Final    Comment: (NOTE) Fact Sheet for Patients: bloggercourse.com  Fact Sheet for Healthcare Providers: seriousbroker.it  This test is not yet approved or cleared by the [place]  FDA and has been authorized for detection and/or diagnosis of SARS-CoV-2 by FDA under an Emergency Use Authorization (EUA). This EUA will remain in effect (meaning this test can be used) for the duration of the COVID-19 declaration under Section 564(b)(1) of the Act, 21 U.S.C. section 360bbb-3(b)(1), unless the authorization is terminated or revoked.  Performed at [facility], [address] [place], [place] [address]   Blood culture (routine x 2)     Status: None (Preliminary result)   Collection Time: 01/10/24 10:47 AM   Specimen: BLOOD  Result Value Ref Range Status   Specimen Description BLOOD BLOOD LEFT HAND  Final   Special Requests   Final    BOTTLES DRAWN AEROBIC AND ANAEROBIC Blood Culture results may not be optimal due to an inadequate volume of blood received in culture bottles   Culture   Final    NO GROWTH < 24 HOURS Performed at [facility], [address] [place], [place] [address]    Report Status PENDING  Incomplete         Radiology Studies: CT Angio Chest PE W and/or Wo Contrast Result Date: 01/10/2024 CLINICAL DATA:  Pulmonary embolism (PE) suspected, high prob EXAM: CT ANGIOGRAPHY  CHEST WITH CONTRAST TECHNIQUE: Multidetector CT imaging of the chest was performed using the standard protocol during bolus administration of intravenous contrast. Multiplanar CT image reconstructions and MIPs were obtained to evaluate the vascular anatomy. RADIATION DOSE REDUCTION: This exam was performed according to the departmental dose-optimization program which includes automated exposure control, adjustment of the mA and/or kV according to patient size and/or use of iterative reconstruction technique. CONTRAST:  75mL OMNIPAQUE  IOHEXOL  350 MG/ML SOLN COMPARISON:  Jun 18, 2023, November 16, 2022, October 08, 2010, it July 01, 2022, cervical spine CT dated April 10, 2022 FINDINGS: Cardiovascular: Heart is mildly enlarged. No pericardial effusion. Three-vessel coronary  artery atherosclerotic calcifications. Atherosclerotic calcifications of the nonaneurysmal thoracic aorta. No pulmonary embolism through the proximal subsegmental pulmonary arteries. Mediastinum/Nodes: Tortuous RIGHT subclavian artery. Visualized thyroid  is unremarkable. No axillary adenopathy. Mildly prominent RIGHT hilar lymph nodes measuring 10 mm in the short axis, likely reactive (series 6, image 207). Upper limits of normal LEFT subclavian lymph node measuring 7 mm in the short axis (series 6, image 27). This appears similar compared to the priors. Lungs/Pleura: No pleural effusion or pneumothorax. Moderate centrilobular emphysema. Bronchial wall thickening with scattered areas bronchiectasis. Patchy peripheral ground-glass opacities of the RIGHT upper lobe. Bandlike opacities of the LEFT upper and lower Lobe, likely atelectasis. RIGHT lower lobe pulmonary nodule measures 4 x 2 mm (series 6, image 281). LEFT lower lobe 3 mm pulmonary nodule, stable dating back to 2012 and consistent with a benign etiology (series 6, image 272). Upper Abdomen: Partial visualization of dilation of the LEFT renal collecting system with fat stranding along the  LEFT kidney. Overall extent of LEFT-sided hydronephrosis is favored increased since most recent prior in May 2025, but this is at the edge of the field of view. Large hiatal hernia. Musculoskeletal: Revisualization of several compression fracture deformities of the lower thoracic spine status post vertebroplasty. Mild to moderate compression fracture deformity of T10, favored similar compared to both prior radiographs. RIGHT shoulder os acromiale. Review of the MIP images confirms the above findings. IMPRESSION: 1. No pulmonary embolism through the proximal subsegmental pulmonary arteries. 2. Patchy peripheral ground-glass opacities of the RIGHT upper lobe, likely infectious or inflammatory in etiology. Consider follow-up CT in 3 months to assess for resolution if clinically appropriate. 3. Partial visualization of dilation of the LEFT renal collecting system with fat stranding along the LEFT kidney. Overall extent of LEFT-sided hydronephrosis is favored increased since most recent prior in May 2025, but this is at the edge of the field of view. Recommend correlation with urinalysis and dedicated CT abdomen pelvis if clinically indicated. Aortic Atherosclerosis (ICD10-I70.0) and Emphysema (ICD10-J43.9). Electronically Signed   By: Corean Salter M.D.   On: 01/10/2024 12:49   DG Chest Port 1 View Result Date: 01/10/2024 CLINICAL DATA:  Shortness of breath. EXAM: PORTABLE CHEST - 1 VIEW COMPARISON:  07/20/2023 FINDINGS: Cardiomediastinal silhouette and pulmonary vasculature are within normal limits. Mild atelectasis present at the LEFT lung base. Questionable nodular opacity seen in the RIGHT mid lung measuring 1.5 cm. Lungs are otherwise clear. Moderate size hiatal hernia. Vertebral augmentation changes again seen in the lower thoracic and upper lumbar spine. ACDF changes seen in the lower cervical spine. IMPRESSION: 1. Questionable 1.5 cm nodular opacity seen in the RIGHT mid lung. Recommend dedicated  two-view chest radiograph for confirmation. 2. Mild LEFT basilar atelectasis. Electronically Signed   By: Aliene Lloyd M.D.   On: 01/10/2024 11:10        Scheduled Meds:  azithromycin   500 mg Oral Daily   budesonide -glycopyrrolate -formoterol   2 puff Inhalation BID   cholecalciferol   1,000 Units Oral Daily   furosemide   20 mg Oral Daily   heparin   5,000 Units Subcutaneous Q8H   methenamine   1 g Oral BID   polyethylene glycol  17 g Oral BID   sodium chloride  flush  3 mL Intravenous Q12H   Continuous Infusions:  cefTRIAXone  (ROCEPHIN )  IV Stopped (01/11/24 1359)     LOS: 1 day    Time spent: 35 mins    Adonys Wildes Maree, MD [facility] Pager [phone]  If 7PM-7AM, please contact night-coverage  01/11/2024,  5:32 PM

## 2024-01-11 NOTE — Progress Notes (Signed)
 [facility] Liaison Note  Received an epic chat from Dr. Maree requesting patient be evaluated for [facility] and/or hospice at [facility] where patient resides.  I notified Dr. Maree that we could not provide hospice services at [facility] due to they have their own hospice [facility]).  Patient has a palliative care consult pending.    Chart review completed.  Patient does not appear to be appropriate for hospice [facility] at this time.  Patient is not on full comfort care- continues to receive IV antibiotics and taking PO meds with no acute symptom management needs.    Hospital liaison team will follow up with patient/family pending outcome of palliative consult.  Thank you for allowing participation in this patient's care.  Saddie HILARIO Na, RN Nurse Liaison [phone]

## 2024-01-12 DIAGNOSIS — N1831 Chronic kidney disease, stage 3a: Secondary | ICD-10-CM | POA: Diagnosis not present

## 2024-01-12 DIAGNOSIS — I5032 Chronic diastolic (congestive) heart failure: Secondary | ICD-10-CM | POA: Diagnosis not present

## 2024-01-12 DIAGNOSIS — J9601 Acute respiratory failure with hypoxia: Secondary | ICD-10-CM | POA: Diagnosis not present

## 2024-01-12 DIAGNOSIS — J449 Chronic obstructive pulmonary disease, unspecified: Secondary | ICD-10-CM | POA: Diagnosis not present

## 2024-01-12 LAB — URINE CULTURE

## 2024-01-12 LAB — GLUCOSE, CAPILLARY: Glucose-Capillary: 103 mg/dL — ABNORMAL HIGH (ref 70–99)

## 2024-01-12 MED ORDER — HYOSCYAMINE SULFATE ER 0.375 MG PO TB12
0.3750 mg | ORAL_TABLET | Freq: Two times a day (BID) | ORAL | Status: DC
  Administered 2024-01-12 – 2024-01-13 (×3): 0.375 mg via ORAL
  Filled 2024-01-12 (×3): qty 1

## 2024-01-12 MED ORDER — APIXABAN 5 MG PO TABS
5.0000 mg | ORAL_TABLET | Freq: Two times a day (BID) | ORAL | Status: DC
  Administered 2024-01-12 – 2024-01-13 (×3): 5 mg via ORAL
  Filled 2024-01-12 (×3): qty 1

## 2024-01-12 NOTE — Evaluation (Signed)
 Physical Therapy Evaluation Patient Details Name: Monica Singh MRN: [id number] DOB: [birth year] Today's Date: 01/12/2024  History of Present Illness  Pt is a 89 y.o. year old female with medical history of HTN, COPD, CHF, DDD, UTI, AKI, CKD, advanced dementia coming from memory care. MD assessment includes: acute hypoxic resp failure, SOB, coughing.  Clinical Impression  Pt was lying in bed on arrival with daughter present at bedside, was pleasant and somewhat motivated to participate during the session and put forth good effort throughout. Pt performed bed mobility with max cueing for sequencing for hand placement on handrails with HOB elevated. Pt was able to sit EOB with mod A +2 and max cueing to maintain upright posture due to posterior lean. Pt stood bedside briefly, with max cueing for hand placement using RW and mod A +2 for physical support, but was unable to maintain more than 30 seconds before needing to return to sitting due to BM (nursing notified). Pt was unable to achieve knee extension in standing and was very unsteady on feet. Pts SpO2 and HR WNL throughout on 2L [place]. Pt will benefit from continued PT services upon discharge to safely address deficits listed in patient problem list for decreased caregiver assistance and eventual return to PLOF.       If plan is discharge home, recommend the following: A lot of help with walking and/or transfers;A lot of help with bathing/dressing/bathroom;Help with stairs or ramp for entrance;Assistance with cooking/housework;Assistance with feeding;Supervision due to cognitive status;Assist for transportation   Can travel by private vehicle        Equipment Recommendations    Recommendations for Other Services       Functional Status Assessment Patient has had a recent decline in their functional status and demonstrates the ability to make significant improvements in function in a reasonable and predictable amount of time.      Precautions / Restrictions Precautions Precautions: Fall Recall of Precautions/Restrictions: Impaired Restrictions Weight Bearing Restrictions Per Provider Order: No      Mobility  Bed Mobility Overal bed mobility: Needs Assistance Bed Mobility: Supine to Sit, Sit to Supine     Supine to sit: Mod assist, +2 for safety/equipment Sit to supine: Mod assist, +2 for safety/equipment        Transfers Overall transfer level: Needs assistance Equipment used: Rolling walker (2 wheels) Transfers: Sit to/from Stand Sit to Stand: Mod assist, +2 physical assistance                Ambulation/Gait:                General Gait Details: unable due to safety   Stairs            Wheelchair Mobility     Tilt Bed    Modified Rankin (Stroke Patients Only)       Balance Overall balance assessment: Needs assistance Sitting-balance support: Bilateral upper extremity supported, Feet supported Sitting balance-Leahy Scale: Poor Sitting balance - Comments: at times pt was able to sit upright but had a heavy posterior bias and would need additional support and cueing to maintain upright posture Postural control: Posterior lean Standing balance support: Bilateral upper extremity supported, During functional activity, Reliant on assistive device for balance Standing balance-Leahy Scale: Poor Standing balance comment: pt tolerated standing briefly before needing to return to sitting -- unable to maintain upright posture due to posterior lean  Pertinent Vitals/Pain Pain Assessment Pain Assessment: Faces Faces Pain Scale: No hurt Breathing: normal Negative Vocalization: none Facial Expression: smiling or inexpressive Body Language: relaxed Consolability: no need to console PAINAD Score: 0    Home Living Family/patient expects to be discharged to:: Skilled nursing facility                   Additional Comments: [facility]    Prior Function Prior Level of Function : Needs assist       Physical Assist : Mobility (physical);ADLs (physical) Mobility (physical): Bed mobility;Transfers   Mobility Comments: Pt ambulatory with RW at baseline, short walks to the bathroom with stand by assist, facility helping with bed mobility PRN ADLs Comments: has assist at SNF for all ADLs     Extremity/Trunk Assessment   Upper Extremity Assessment Upper Extremity Assessment: Defer to OT evaluation    Lower Extremity Assessment Lower Extremity Assessment: Generalized weakness       Communication   Communication Communication: Impaired Factors Affecting Communication: Hearing impaired    Cognition Arousal: Alert Behavior During Therapy: Flat affect   PT - Cognitive impairments: Awareness, Memory, Attention, Initiation, Sequencing, Problem solving, Safety/Judgement, History of cognitive impairments                         Following commands: Impaired Following commands impaired: Follows one step commands inconsistently, Follows one step commands with increased time     Cueing Cueing Techniques: Verbal cues, Gestural cues, Tactile cues, Visual cues     General Comments General comments (skin integrity, edema, etc.): pt required mod A +2 for all mobility during session, needed max cueing for sequecning during bed mobility. Difficult time with initiation and needed increased time for processing    Exercises     Assessment/Plan    PT Assessment Patient needs continued PT services  PT Problem List Decreased activity tolerance;Decreased range of motion;Decreased strength;Decreased balance;Decreased mobility;Decreased cognition;Decreased knowledge of use of DME       PT Treatment Interventions DME instruction;Gait training;Stair training;Functional mobility training;Therapeutic activities;Therapeutic exercise;Balance training;Cognitive remediation;Patient/family education    PT Goals  (Current goals can be found in the Care Plan section)  Acute Rehab PT Goals Patient Stated Goal: unable to obtain    Frequency Min 2X/week     Co-evaluation PT/OT/SLP Co-Evaluation/Treatment: Yes Reason for Co-Treatment: Complexity of the patient's impairments (multi-system involvement);For patient/therapist safety;To address functional/ADL transfers PT goals addressed during session: Mobility/safety with mobility;Balance;Proper use of DME OT goals addressed during session: ADL's and self-care       AM-PAC PT 6 Clicks Mobility  Outcome Measure Help needed turning from your back to your side while in a flat bed without using bedrails?: A Lot Help needed moving from lying on your back to sitting on the side of a flat bed without using bedrails?: A Lot Help needed moving to and from a bed to a chair (including a wheelchair)?: Total Help needed standing up from a chair using your arms (e.g., wheelchair or bedside chair)?: Total Help needed to walk in hospital room?: Total Help needed climbing 3-5 steps with a railing? : Total 6 Click Score: 8    End of Session   Activity Tolerance: Patient limited by fatigue Patient left: in bed;with call bell/phone within reach;with bed alarm set Nurse Communication: Mobility status PT Visit Diagnosis: Unsteadiness on feet (R26.81);Muscle weakness (generalized) (M62.81);Difficulty in walking, not elsewhere classified (R26.2)    Time: 1014-1050 PT Time Calculation (min) (  ACUTE ONLY): 36 min   Charges:                 Corean Newport, SPT 01/12/2024, 1:42 PM

## 2024-01-12 NOTE — Progress Notes (Signed)
 Bladder scan order  discontinue as MD order. Pt has been voiding.

## 2024-01-12 NOTE — Evaluation (Signed)
 Occupational Therapy Evaluation Patient Details Name: Monica Singh  ZHANA JEANGILLES MRN: [id number] DOB: 1928/10/06 Today's Date: 01/12/2024   History of Present Illness   89 y.o. year old female with medical history of HTN, COPD, CHF (EF 60 % in 05/2023 with G1DD), DDD, advanced dementia coming to the ED with from memory care units for shortness of breath and coughing. Admitted for acute hypoic respiratory failure 2/2 pneumonia   Clinical Impressions Ms Plouffe was seen for OT evaluation this date. Prior to hospital admission, pt required assist from staff for ADLs, limited household distances with RW. Pt lives at [facility]. Pt currently requires MOD A exit bed, MAX A return to bed. MOD A x2 + RW sit<>stand x trials, MAX A pericare standing due to large BM found in bed. Upon return to sitting pt with bout of emesis - RN notified. Pt returned top bed for toileting and bed changeMAX A. Pt would benefit from skilled OT to address noted impairments and functional limitations (see below for any additional details). Upon hospital discharge, recommend OT follow up as tolerable.     If plan is discharge home, recommend the following:   Two people to help with walking and/or transfers;Two people to help with bathing/dressing/bathroom;Supervision due to cognitive status     Functional Status Assessment   Patient has had a recent decline in their functional status and demonstrates the ability to make significant improvements in function in a reasonable and predictable amount of time.     Equipment Recommendations   Hospital bed;Wheelchair (measurements OT)     Recommendations for Other Services         Precautions/Restrictions   Precautions Precautions: Fall Recall of Precautions/Restrictions: Impaired Restrictions Weight Bearing Restrictions Per Provider Order: No     Mobility Bed Mobility Overal bed mobility: Needs Assistance Bed Mobility: Supine to Sit, Sit to  Supine     Supine to sit: Mod assist, +2 for safety/equipment Sit to supine: Max assist     Transfers Overall transfer level: Needs assistance Equipment used: Rolling walker (2 wheels) Transfers: Sit to/from Stand Sit to Stand: Mod assist, +2 physical assistance                  Balance Overall balance assessment: Needs assistance Sitting-balance support: Bilateral upper extremity supported, Feet supported Sitting balance-Leahy Scale: Fair   Postural control: Posterior lean Standing balance support: Bilateral upper extremity supported, During functional activity, Reliant on assistive device for balance Standing balance-Leahy Scale: Poor                             ADL either performed or assessed with clinical judgement   ADL Overall ADL's : Needs assistance/impaired                                       General ADL Comments: MOD A x2 + RW simulated BSC t/f, MAX A x2 pericare standing. MAX A x2 toileting and bed change at bed level.      Pertinent Vitals/Pain Pain Assessment Pain Assessment: PAINAD Breathing: occasional labored breathing, short period of hyperventilation Negative Vocalization: none Facial Expression: sad, frightened, frown Body Language: relaxed Consolability: distracted or reassured by voice/touch PAINAD Score: 3 Pain Intervention(s): Limited activity within patient's tolerance, Repositioned     Extremity/Trunk Assessment Upper Extremity Assessment Upper Extremity Assessment: Generalized weakness   Lower  Extremity Assessment Lower Extremity Assessment: Generalized weakness       Communication Communication Communication: Impaired Factors Affecting Communication: Hearing impaired   Cognition Arousal: Alert Behavior During Therapy: WFL for tasks assessed/performed Cognition: History of cognitive impairments             OT - Cognition Comments: oriented to self and family                  Following commands: Impaired Following commands impaired: Follows one step commands inconsistently, Follows one step commands with increased time     Cueing  General Comments      + emesis during session   Exercises     Shoulder Instructions      Home Living Family/patient expects to be discharged to:: Skilled nursing facility                                 Additional Comments: [facility]      Prior Functioning/Environment Prior Level of Function : Needs assist       Physical Assist : Mobility (physical);ADLs (physical) Mobility (physical): Bed mobility;Transfers   Mobility Comments: Pt ambulatory with RW at baseline, short walks to the bathroom with stand by assist, recent ~1 month decline to pivot t/fs only ADLs Comments: assist for dressign/bathing and sequencing all ADLs    OT Problem List: Decreased strength;Decreased range of motion;Decreased activity tolerance;Impaired balance (sitting and/or standing);Decreased safety awareness   OT Treatment/Interventions: Self-care/ADL training;Therapeutic exercise;DME and/or AE instruction;Energy conservation;Therapeutic activities;Patient/family education      OT Goals(Current goals can be found in the care plan section)   Acute Rehab OT Goals Patient Stated Goal: to improve breathing OT Goal Formulation: With family Time For Goal Achievement: 01/26/24 Potential to Achieve Goals: Fair ADL Goals Pt Will Perform Grooming: with min assist;sitting Pt Will Perform Lower Body Dressing: with min assist;sitting/lateral leans Pt Will Transfer to Toilet: with min assist;stand pivot transfer;bedside commode   OT Frequency:  Min 1X/week    Co-evaluation PT/OT/SLP Co-Evaluation/Treatment: Yes Reason for Co-Treatment: Complexity of the patient's impairments (multi-system involvement);For patient/therapist safety;To address functional/ADL transfers PT goals addressed during session:  Mobility/safety with mobility;Balance;Proper use of DME OT goals addressed during session: ADL's and self-care      AM-PAC OT 6 Clicks Daily Activity     Outcome Measure Help from another person eating meals?: None Help from another person taking care of personal grooming?: A Little Help from another person toileting, which includes using toliet, bedpan, or urinal?: A Lot Help from another person bathing (including washing, rinsing, drying)?: A Lot Help from another person to put on and taking off regular upper body clothing?: A Little Help from another person to put on and taking off regular lower body clothing?: A Lot 6 Click Score: 16   End of Session Nurse Communication: Mobility status  Activity Tolerance: Patient tolerated treatment well Patient left: in bed;with call bell/phone within reach;with bed alarm set  OT Visit Diagnosis: Other abnormalities of gait and mobility (R26.89);Muscle weakness (generalized) (M62.81)                Time: 1014-1050 OT Time Calculation (min): 36 min Charges:  OT General Charges $OT Visit: 1 Visit OT Evaluation $OT Eval Moderate Complexity: 1 Mod OT Treatments $Self Care/Home Management : 8-22 mins  Elston Slot, M.S. OTR/L  01/12/2024, 1:58 PM  ascom [phone]

## 2024-01-12 NOTE — Progress Notes (Signed)
 PROGRESS NOTE    Monica  ICEY Singh  FMW:[id number] DOB: 08-19-1928 DOA: 01/10/2024 PCP: [facility]    Brief Narrative:    89 y.o. year old female with medical history of HTN, COPD, CHF (EF 60 % in 05/2023 with G1DD), DDD, advanced dementia coming to the ED with from memory care units for shortness of breath and coughing.   12/14: Palliative care c/s 12/15: Discontinue telemetry transfer to any MedSurg.  Added hyoscyamine  for oral secretions.  Family would like hospice at [facility]   Assessment & Plan:   Principal Problem:   Acute hypoxic respiratory failure (HCC) Active Problems:   Diastolic CHF, chronic (HCC)   COPD (chronic obstructive pulmonary disease) (HCC)   Frequent UTI   Chronic kidney disease (CKD), stage 3 (HCC)   Essential hypertension   H/O non-Hodgkin's lymphoma   DDD (degenerative disc disease), cervical   DDD (degenerative disc disease), lumbar   Goals of care, counseling/discussion   Severe sepsis (HCC)   Obstruction of left ureteropelvic junction (UPJ)   Advanced dementia (HCC)   Constipation   PNA Pt presented with AHRF 2/2 to pneumonia. Image findings consistent with pneumonia. Blood cultures neg.  -Continue Ceftriaxone  and Azithromycin   -Monitor fever curve, trend WBC -Wean oxygen as able   History of recurrent UTI: continue methenamine . UA worrisome for UTI, urine c/s shows multiple species Obstruction of left ureteropelvic junction:  Pt with left UPJ obstruction s/p stent placement by urology. They are changing stents on yearly basis. Pt is to see them outpatient for consideration of future stent change.  - hold off CT based on family meeting, they're agreeable   Dementia: Given interaction with patient, it appears to be severe.  Complicating factor for this patient. Delirium precautions placed.    COPD: Patient with shortness of breath but no cough or sputum production.  Not in COPD exacerbation. continue DuoNebs.    Degenerative disc disease: On tramadol  as needed.  Will continue here.   Constipation: Likely secondary to opioid:  continue laxatives as needed.  Monitor her bowel movements as that can lead to delirium in patients with advanced dementia.   CKD 3A: At baseline.  Renally dose meds.  Avoid nephrotoxic agents.   HFpEF: Not volume overloaded.  ontinue home Lasix .   Hx of DVT: on eliquis . Holding currently. Using heparin  subcutaneous for prophylaxis.    DVT prophylaxis: Eliquis       Code Status: DNR Family Communication: Daughter updated at bedside, family is agreeable for hospice at [facility] Disposition Plan: Likely tomorrow back to [facility] with hospice   Consultants:  Palliative care    Antimicrobials:  Rocephin  Zithromax  Methenamine     Subjective:   Lot of oral secretions and coughing, daughter at bedside requesting for hospice at [facility] and wanting to keep her comfortable  Objective: Vitals:   01/12/24 0500 01/12/24 0637 01/12/24 0829 01/12/24 1220  BP:   (!) 127/95 130/79  Pulse:   98 99  Resp:   15 19  Temp:   97.7 F (36.5 C) 98 F (36.7 C)  TempSrc:    Oral  SpO2:  92% 94% 94%  Weight: 64.2 kg     Height:        Intake/Output Summary (Last 24 hours) at 01/12/2024 1223 Last data filed at 01/12/2024 0900 Gross per 24 hour  Intake 463 ml  Output 800 ml  Net -337 ml   Filed Weights   01/10/24 1042 01/11/24 0500 01/12/24 0500  Weight: 58.8  kg 58.8 kg 64.2 kg    Examination:  General exam: Appears calm and comfortable  Respiratory system: rhochi at bases b/l a lot of oral secretions and gurgling sounds Cardiovascular system: S1 & S2 heard, RRR. No murmurs, pedal edema. Gastrointestinal system: Abdomen is soft, benign Central nervous system: Alert and awake, pleasantly confused No focal neurological deficits. Extremities: Symmetric 5 x 5 power. Skin: No rashes, lesions or ulcers     Data Reviewed: I have personally  reviewed following labs and imaging studies  CBC: Recent Labs  Lab 01/10/24 1041 01/11/24 0351  WBC 7.6 5.7  HGB 10.1* 9.1*  HCT 31.9* 28.8*  MCV 88.9 88.1  PLT 379 371   Basic Metabolic Panel: Recent Labs  Lab 01/10/24 1041 01/10/24 1728 01/11/24 0351  NA 134*  --  136  K 3.8  --  3.9  CL 95*  --  99  CO2 26  --  25  GLUCOSE 133*  --  90  BUN 22  --  17  CREATININE 0.91  --  0.77  CALCIUM  8.6*  --  8.1*  MG  --  2.0  --    GFR: Estimated Creatinine Clearance: 39.6 mL/min (by C-G formula based on SCr of 0.77 mg/dL). Liver Function Tests: Recent Labs  Lab 01/10/24 1041  AST 17  ALT 10  ALKPHOS 79  BILITOT 0.3  PROT 6.6  ALBUMIN 3.6    BNP (last 3 results) Recent Labs    01/10/24 1041  PROBNP 1,164.0*   HbA1C: No results for input(s): HGBA1C in the last 72 hours. CBG: Recent Labs  Lab 01/11/24 0816 01/12/24 0827  GLUCAP 109* 103*   Lipid Profile: No results for input(s): CHOL, HDL, LDLCALC, TRIG, CHOLHDL, LDLDIRECT in the last 72 hours. Thyroid  Function Tests: No results for input(s): TSH, T4TOTAL, FREET4, T3FREE, THYROIDAB in the last 72 hours. Anemia Panel: No results for input(s): VITAMINB12, FOLATE, FERRITIN, TIBC, IRON, RETICCTPCT in the last 72 hours. Sepsis Labs: Recent Labs  Lab 01/10/24 1041 01/10/24 1310  LATICACIDVEN 2.9* 1.4    Recent Results (from the past 240 hours)  Blood culture (routine x 2)     Status: None (Preliminary result)   Collection Time: 01/10/24 10:42 AM   Specimen: BLOOD  Result Value Ref Range Status   Specimen Description BLOOD LEFT ANTECUBITAL  Final   Special Requests   Final    BOTTLES DRAWN AEROBIC AND ANAEROBIC Blood Culture results may not be optimal due to an inadequate volume of blood received in culture bottles   Culture   Final    NO GROWTH 2 DAYS Performed at [facility], [address], [place], [place] [address]    Report Status PENDING   Incomplete  Resp panel by RT-PCR (RSV, Flu A&B, Covid) Anterior Nasal Swab     Status: None   Collection Time: 01/10/24 10:42 AM   Specimen: Anterior Nasal Swab  Result Value Ref Range Status   SARS Coronavirus 2 by RT PCR NEGATIVE NEGATIVE Final    Comment: (NOTE) SARS-CoV-2 target nucleic acids are NOT DETECTED.  The SARS-CoV-2 RNA is generally detectable in upper respiratory specimens during the acute phase of infection. The lowest concentration of SARS-CoV-2 viral copies this assay can detect is 138 copies/mL. A negative result does not preclude SARS-Cov-2 infection and should not be used as the sole basis for treatment or other patient management decisions. A negative result may occur with  improper specimen collection/handling, submission of specimen other than nasopharyngeal  swab, presence of viral mutation(s) within the areas targeted by this assay, and inadequate number of viral copies(<138 copies/mL). A negative result must be combined with clinical observations, patient history, and epidemiological information. The expected result is Negative.  Fact Sheet for Patients:  bloggercourse.com  Fact Sheet for Healthcare Providers:  seriousbroker.it  This test is no t yet approved or cleared by the [place]  FDA and  has been authorized for detection and/or diagnosis of SARS-CoV-2 by FDA under an Emergency Use Authorization (EUA). This EUA will remain  in effect (meaning this test can be used) for the duration of the COVID-19 declaration under Section 564(b)(1) of the Act, 21 U.S.C.section 360bbb-3(b)(1), unless the authorization is terminated  or revoked sooner.       Influenza A by PCR NEGATIVE NEGATIVE Final   Influenza B by PCR NEGATIVE NEGATIVE Final    Comment: (NOTE) The Xpert Xpress SARS-CoV-2/FLU/RSV plus assay is intended as an aid in the diagnosis of influenza from Nasopharyngeal swab specimens and should  not be used as a sole basis for treatment. Nasal washings and aspirates are unacceptable for Xpert Xpress SARS-CoV-2/FLU/RSV testing.  Fact Sheet for Patients: bloggercourse.com  Fact Sheet for Healthcare Providers: seriousbroker.it  This test is not yet approved or cleared by the [place]  FDA and has been authorized for detection and/or diagnosis of SARS-CoV-2 by FDA under an Emergency Use Authorization (EUA). This EUA will remain in effect (meaning this test can be used) for the duration of the COVID-19 declaration under Section 564(b)(1) of the Act, 21 U.S.C. section 360bbb-3(b)(1), unless the authorization is terminated or revoked.     Resp Syncytial Virus by PCR NEGATIVE NEGATIVE Final    Comment: (NOTE) Fact Sheet for Patients: bloggercourse.com  Fact Sheet for Healthcare Providers: seriousbroker.it  This test is not yet approved or cleared by the [place]  FDA and has been authorized for detection and/or diagnosis of SARS-CoV-2 by FDA under an Emergency Use Authorization (EUA). This EUA will remain in effect (meaning this test can be used) for the duration of the COVID-19 declaration under Section 564(b)(1) of the Act, 21 U.S.C. section 360bbb-3(b)(1), unless the authorization is terminated or revoked.  Performed at [facility], [address] [place], [place] [address]   Blood culture (routine x 2)     Status: None (Preliminary result)   Collection Time: 01/10/24 10:47 AM   Specimen: BLOOD  Result Value Ref Range Status   Specimen Description BLOOD BLOOD LEFT HAND  Final   Special Requests   Final    BOTTLES DRAWN AEROBIC AND ANAEROBIC Blood Culture results may not be optimal due to an inadequate volume of blood received in culture bottles   Culture   Final    NO GROWTH 2 DAYS Performed at [facility], [address]  [place], [place] [address]    Report Status PENDING  Incomplete  Urine Culture (for pregnant, neutropenic or urologic patients or patients with an indwelling urinary catheter)     Status: Abnormal   Collection Time: 01/10/24  6:08 PM   Specimen: Urine, Random  Result Value Ref Range Status   Specimen Description   Final    URINE, RANDOM Performed at [facility], [address], [place], [place] [address]    Special Requests   Final    NONE Performed at [facility], [address] [place], [place] [address]    Culture MULTIPLE SPECIES PRESENT, SUGGEST RECOLLECTION (A)  Final   Report Status  01/12/2024 FINAL  Final         Radiology Studies: No results found.       Scheduled Meds:  budesonide -glycopyrrolate -formoterol   2 puff Inhalation BID   cholecalciferol   1,000 Units Oral Daily   furosemide   20 mg Oral Daily   heparin   5,000 Units Subcutaneous Q8H   hyoscyamine   0.375 mg Oral Q12H   methenamine   1 g Oral BID   polyethylene glycol  17 g Oral BID   sodium chloride  flush  3 mL Intravenous Q12H   Continuous Infusions:  cefTRIAXone  (ROCEPHIN )  IV Stopped (01/11/24 1359)     LOS: 2 days    Time spent: 35 mins    Cresencio Fairly, MD [facility] Pager [phone]  If 7PM-7AM, please contact night-coverage  01/12/2024, 12:23 PM

## 2024-01-12 NOTE — Plan of Care (Signed)
°  Problem: Education: Goal: Knowledge of General Education information will improve Description: Including pain rating scale, medication(s)/side effects and non-pharmacologic comfort measures 01/12/2024 1220 by Lynnette Cena CROME, RN Outcome: Progressing 01/12/2024 1208 by Lynnette Cena CROME, RN Outcome: Progressing   Problem: Health Behavior/Discharge Planning: Goal: Ability to manage health-related needs will improve 01/12/2024 1220 by Lynnette Cena CROME, RN Outcome: Progressing 01/12/2024 1208 by Lynnette Cena CROME, RN Outcome: Progressing

## 2024-01-12 NOTE — Progress Notes (Signed)
 Tele d/ced as MD order

## 2024-01-12 NOTE — Plan of Care (Signed)
  Problem: Education: Goal: Knowledge of General Education information will improve Description: Including pain rating scale, medication(s)/side effects and non-pharmacologic comfort measures Outcome: Progressing   Problem: Activity: Goal: Risk for activity intolerance will decrease Outcome: Progressing   Problem: Nutrition: Goal: Adequate nutrition will be maintained Outcome: Progressing   Problem: Coping: Goal: Level of anxiety will decrease Outcome: Progressing   Problem: Elimination: Goal: Will not experience complications related to urinary retention Outcome: Progressing   Problem: Pain Managment: Goal: General experience of comfort will improve and/or be controlled Outcome: Progressing   Problem: Safety: Goal: Ability to remain free from injury will improve Outcome: Progressing   Problem: Skin Integrity: Goal: Risk for impaired skin integrity will decrease Outcome: Progressing

## 2024-01-12 NOTE — Plan of Care (Signed)
   Problem: Education: Goal: Knowledge of General Education information will improve Description Including pain rating scale, medication(s)/side effects and non-pharmacologic comfort measures Outcome: Progressing   Problem: Health Behavior/Discharge Planning: Goal: Ability to manage health-related needs will improve Outcome: Progressing

## 2024-01-12 NOTE — [place] FL2 (Signed)
 [place]  MEDICAID FL2 LEVEL OF CARE FORM     IDENTIFICATION  Patient Name: Monica  FARRAN Singh Birthdate: 1929-01-28 Sex: female Admission Date (Current Location): 01/10/2024  [place] and [name] Number:  Chiropodist and Address:  [facility], [address] [place], [place] [address]      Provider Number: [id number]  Attending Physician Name and Address:  Maree Hue, MD  Relative Name and Phone Number:       Current Level of Care: Hospital Recommended Level of Care: Skilled Nursing Facility Prior Approval Number:    Date Approved/Denied:   PASRR Number: [id number]  Discharge Plan: SNF    Current Diagnoses: Patient Active Problem List   Diagnosis Date Noted   Acute hypoxic respiratory failure (HCC) 01/10/2024   Constipation 06/24/2023   Thrush 06/23/2023   Acute urinary retention 06/23/2023   Compression fracture of T11 vertebra, sequela 06/21/2023   AKI (acute kidney injury) 06/19/2023   Nephrolithiasis 06/19/2023   Closed compression fracture of body of L1 vertebra (HCC) 06/19/2023   Overweight (BMI 25.0-29.9) 06/19/2023   Compression fracture of T11 vertebra (HCC) 06/19/2023   Chronic kidney disease (CKD), stage 3 (HCC)    Diastolic CHF, chronic (HCC) 06/14/2023   COPD exacerbation (HCC) 06/14/2023   E-coli UTI 11/16/2022   Advanced dementia (HCC) 11/16/2022   Acute metabolic encephalopathy 11/16/2022   Generalized weakness 04/11/2022   Ground-level fall 04/10/2022   COPD with acute exacerbation (HCC) 04/10/2022   Essential hypertension 04/10/2022   Acute encephalopathy 04/10/2022   Hiatal hernia 01/05/2022   Dysuria 09/28/2021   Left wrist pain 07/23/2021   Tendinitis of left wrist 07/23/2021   Closed fracture of distal end of left radius 07/23/2021   Tenosynovitis of left wrist 07/23/2021   Bilateral carotid artery stenosis 05/27/2020   Staring episodes 12/19/2019   B12 deficiency 12/19/2019   Vitamin D   deficiency 12/19/2019   Frequent UTI 12/19/2019   Syncope 12/16/2019   Total knee replacement status 12/23/2018   Obstruction of left ureteropelvic junction (UPJ) 02/26/2018   Severe sepsis (HCC) 02/10/2018   Closed burst fracture of lumbar vertebra with routine healing 04/01/2017   Chronic bronchitis (HCC) 02/18/2017   Age-related osteoporosis with current pathological fracture with routine healing 02/18/2017   Osteoporosis 11/20/2015   COPD (chronic obstructive pulmonary disease) (HCC) 11/20/2015   Cyst of ovary 07/26/2015   Status post total left knee replacement 04/03/2015   Goals of care, counseling/discussion 10/12/2014   Abnormal chest sounds 08/25/2014   Allergic rhinitis 08/25/2014   Colon polyp 08/25/2014   DDD (degenerative disc disease), cervical 08/25/2014   DDD (degenerative disc disease), lumbar 08/25/2014   H/O non-Hodgkin's lymphoma 08/25/2014   Hyponatremia 08/25/2014   Primary osteoarthritis of right knee 08/25/2014   Subclinical hypothyroidism 08/25/2014   Adnexal mass 06/03/2014   Osteoarthritis 03/19/2011   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   History of smoking 03/11/2008    Orientation RESPIRATION BLADDER Height & Weight      (dementia)  Normal Incontinent Weight: 64.2 kg Height:  5' 3 (160 cm)  BEHAVIORAL SYMPTOMS/MOOD NEUROLOGICAL BOWEL NUTRITION STATUS   (none)   Incontinent Diet (heart healthy)  AMBULATORY STATUS COMMUNICATION OF NEEDS Skin   Total Care Does not communicate Skin abrasions (right lower leg)                       Personal Care Assistance Level of Assistance  Total care  Functional Limitations Info  Sight, Hearing, Speech Sight Info: Impaired Hearing Info: Impaired Speech Info: Impaired    SPECIAL CARE FACTORS FREQUENCY                       Contractures Contractures Info: Not present    Additional Factors Info  Code Status, Allergies Code Status Info: DNR Allergies Info: Cipro , laytex,  sulfanamide compounds, sulfa           Current Medications (01/12/2024):  This is the current hospital active medication list Current Facility-Administered Medications  Medication Dose Route Frequency Provider Last Rate Last Admin   acetaminophen  (TYLENOL ) tablet 650 mg  650 mg Oral Q6H PRN Fernand Prost, MD       Or   acetaminophen  (TYLENOL ) suppository 650 mg  650 mg Rectal Q6H PRN Fernand Prost, MD       apixaban  (ELIQUIS ) tablet 5 mg  5 mg Oral BID Maree Hue, MD       budesonide -glycopyrrolate -formoterol  (BREZTRI ) 160-9-4.8 MCG/ACT inhaler 2 puff  2 puff Inhalation BID Fernand Prost, MD   2 puff at 01/12/24 0957   cefTRIAXone  (ROCEPHIN ) 2 g in sodium chloride  0.9 % 100 mL IVPB  2 g Intravenous Q24H Khan, Ghalib, MD   Stopped at 01/11/24 1359   cholecalciferol  (VITAMIN D3) 25 MCG (1000 UNIT) tablet 1,000 Units  1,000 Units Oral Daily Fernand Prost, MD   1,000 Units at 01/12/24 0955   furosemide  (LASIX ) tablet 20 mg  20 mg Oral Daily Khan, Ghalib, MD   20 mg at 01/12/24 0955   guaiFENesin -dextromethorphan  (ROBITUSSIN DM) 100-10 MG/5ML syrup 5 mL  5 mL Oral Q4H PRN Maree Hue, MD   5 mL at 01/12/24 1003   hyoscyamine  (LEVBID ) 0.375 MG 12 hr tablet 0.375 mg  0.375 mg Oral Q12H Maree, Vipul, MD   0.375 mg at 01/12/24 1003   ipratropium (ATROVENT ) nebulizer solution 0.5 mg  0.5 mg Nebulization Q8H PRN Khan, Ghalib, MD   0.5 mg at 01/12/24 0636   methenamine  (MANDELAMINE) tablet 1 g  1 g Oral BID Khan, Ghalib, MD   1 g at 01/12/24 0955   ondansetron  (ZOFRAN ) tablet 4 mg  4 mg Oral Q6H PRN Fernand Prost, MD       Or   ondansetron  (ZOFRAN ) injection 4 mg  4 mg Intravenous Q6H PRN Khan, Ghalib, MD       Oral care mouth rinse  15 mL Mouth Rinse PRN Fernand Prost, MD       oxyCODONE  (Oxy IR/ROXICODONE ) immediate release tablet 5 mg  5 mg Oral Q6H PRN Khan, Ghalib, MD       polyethylene glycol (MIRALAX  / GLYCOLAX ) packet 17 g  17 g Oral BID Khan, Ghalib, MD   17 g at 01/12/24 0955   senna-docusate  (Senokot-S) tablet 1 tablet  1 tablet Oral QHS PRN Fernand Prost, MD       sodium chloride  flush (NS) 0.9 % injection 3 mL  3 mL Intravenous Q12H Khan, Ghalib, MD   3 mL at 01/12/24 0956   traMADol  (ULTRAM ) tablet 50 mg  50 mg Oral Q6H PRN Khan, Ghalib, MD         Discharge Medications: Please see discharge summary for a list of discharge medications.  Relevant Imaging Results:  Relevant Lab Results:   Additional Information SS# [id number]  Shasta DELENA Daring, RN

## 2024-01-13 ENCOUNTER — Other Ambulatory Visit: Payer: Self-pay

## 2024-01-13 DIAGNOSIS — Z515 Encounter for palliative care: Secondary | ICD-10-CM

## 2024-01-13 LAB — GLUCOSE, CAPILLARY: Glucose-Capillary: 119 mg/dL — ABNORMAL HIGH (ref 70–99)

## 2024-01-13 MED ORDER — HYOSCYAMINE SULFATE ER 0.375 MG PO TB12
0.3750 mg | ORAL_TABLET | Freq: Two times a day (BID) | ORAL | 0 refills | Status: AC
  Filled 2024-01-13: qty 60, 30d supply, fill #0

## 2024-01-13 MED ORDER — DOXYCYCLINE HYCLATE 100 MG PO TABS
100.0000 mg | ORAL_TABLET | Freq: Two times a day (BID) | ORAL | 0 refills | Status: AC
  Filled 2024-01-13: qty 6, 3d supply, fill #0

## 2024-01-13 MED ORDER — FUROSEMIDE 20 MG PO TABS
20.0000 mg | ORAL_TABLET | Freq: Every day | ORAL | 0 refills | Status: AC
  Filled 2024-01-13: qty 30, 30d supply, fill #0

## 2024-01-13 MED ORDER — BENZONATATE 100 MG PO CAPS: 100.0000 mg | ORAL_CAPSULE | Freq: Two times a day (BID) | ORAL | Status: AC | PRN

## 2024-01-13 NOTE — TOC Transition Note (Signed)
 Transition of Care [facility]) - Discharge Note   Patient Details  Name: Monica Singh MRN: [id number] Date of Birth: 28-Feb-1928  Transition of Care [facility]) CM/SW Contact:  Nathanael CHRISTELLA Ring, RN Phone Number: 01/13/2024, 11:13 AM   Clinical Narrative:    [facility] has arrived to transport patient to [facility].  Nurse has called report and patient going to room 406B.     Final next level of care: Skilled Nursing Facility (With hospice at [facility]) Barriers to Discharge: Barriers Resolved   Patient Goals and CMS Choice            Discharge Placement              Patient chooses bed at: [facility] Patient to be transferred to facility by: [facility] Name of family member notified: [name] Patient and family notified of of transfer: 01/13/24  Discharge Plan and Services Additional resources added to the After Visit Summary for   In-house Referral: Clinical Social Work              DME Arranged: N/A                    Social Drivers of Health (SDOH) Interventions SDOH Screenings   Food Insecurity: No Food Insecurity (01/10/2024)  Housing: Low Risk (01/10/2024)  Transportation Needs: No Transportation Needs (01/10/2024)  Utilities: Not At Risk (01/10/2024)  Alcohol Screen: Low Risk (08/19/2022)  Depression (PHQ2-9): Low Risk (08/19/2022)  Financial Resource Strain: Low Risk (08/19/2022)  Physical Activity: Insufficiently Active (08/19/2022)  Social Connections: Socially Isolated (01/10/2024)  Stress: No Stress Concern Present (08/19/2022)  Tobacco Use: Medium Risk (01/10/2024)  Health Literacy: Inadequate Health Literacy (08/19/2022)     Readmission Risk Interventions     No data to display

## 2024-01-13 NOTE — TOC Transition Note (Signed)
 Transition of Care [facility]) - Discharge Note   Patient Details  Name: Monica Singh MRN: [id number] Date of Birth: 1928/11/10  Transition of Care [facility]) CM/SW Contact:  Nathanael CHRISTELLA Ring, RN Phone Number: 01/13/2024, 9:53 AM   Clinical Narrative:    Patient is discharging back to [facility] today, she is going by [facility].  Once back at [facility] they will evaluate her for [facility].     Final next level of care: Skilled Nursing Facility (With hospice at [facility]) Barriers to Discharge: Barriers Resolved   Patient Goals and CMS Choice            Discharge Placement              Patient chooses bed at: [facility] Patient to be transferred to facility by: [facility] Name of family member notified: [name] Patient and family notified of of transfer: 01/13/24  Discharge Plan and Services Additional resources added to the After Visit Summary for   In-house Referral: Clinical Social Work              DME Arranged: N/A                    Social Drivers of Health (SDOH) Interventions SDOH Screenings   Food Insecurity: No Food Insecurity (01/10/2024)  Housing: Low Risk (01/10/2024)  Transportation Needs: No Transportation Needs (01/10/2024)  Utilities: Not At Risk (01/10/2024)  Alcohol Screen: Low Risk (08/19/2022)  Depression (PHQ2-9): Low Risk (08/19/2022)  Financial Resource Strain: Low Risk (08/19/2022)  Physical Activity: Insufficiently Active (08/19/2022)  Social Connections: Socially Isolated (01/10/2024)  Stress: No Stress Concern Present (08/19/2022)  Tobacco Use: Medium Risk (01/10/2024)  Health Literacy: Inadequate Health Literacy (08/19/2022)     Readmission Risk Interventions     No data to display

## 2024-01-13 NOTE — TOC Initial Note (Signed)
 Transition of Care [facility]) - Initial/Assessment Note    Patient Details  Name: Monica Singh MRN: [id number] Date of Birth: Dec 01, 1928  Transition of Care [facility]) CM/SW Contact:    Shasta DELENA Daring, RN Phone Number: 01/13/2024, 9:44 AM  Clinical Narrative:                 RNCM met with patient and daughters. Patient was in bed with eyes open. Did not answer questions or make eye contact. Patient is resident of memory care at [facility]. Confirmed with daughters that patient will return. They report they have spoken to Dr. Maree about initiating hospice care upon return to [facility].  Confirmed with Dr. Maree that patient is appropriate for Hospice services. Confirmed with [facility] AD that patient can will be assess for services when she returns and that hospice care can be provided there.   Notified daughters that [facility] can provide hospice care. Discussed that once in hospice, patient will be treated symptomatically rather than curatively and that focus will be on keeping her comfortable.  Daughters verbalized understanding.   Expected Discharge Plan: Skilled Nursing Facility Barriers to Discharge: Continued Medical Work up   Patient Goals and CMS Choice            Expected Discharge Plan and Services In-house Referral: Clinical Social Work       Expected Discharge Date: 01/13/24                                    Prior Living Arrangements/Services     Patient language and need for interpreter reviewed:: Yes Do you feel safe going back to the place where you live?: Yes      Need for Family Participation in Patient Care: Yes (Comment) Care giver support system in place?: Yes (comment)   Criminal Activity/Legal Involvement Pertinent to Current Situation/Hospitalization: No - Comment as needed  Activities of Daily Living   ADL Screening (condition at time of admission) Independently performs ADLs?: No Does the patient have a NEW difficulty with  bathing/dressing/toileting/self-feeding that is expected to last >3 days?: No Does the patient have a NEW difficulty with getting in/out of bed, walking, or climbing stairs that is expected to last >3 days?: No Does the patient have a NEW difficulty with communication that is expected to last >3 days?: No Is the patient deaf or have difficulty hearing?: Yes (b/l hearing aides) Does the patient have difficulty seeing, even when wearing glasses/contacts?: Yes (wears glasses) Does the patient have difficulty concentrating, remembering, or making decisions?: Yes  Permission Sought/Granted Permission sought to share information with : Case Manager Permission granted to share information with : Yes, Verbal Permission Granted     Permission granted to share info w AGENCY: [facility]        Emotional Assessment   Attitude/Demeanor/Rapport: Unable to Assess Affect (typically observed): Unable to Assess Orientation: :  (dementia)      Admission diagnosis:  Severe sepsis (HCC) [A41.9, R65.20] Acute hypoxic respiratory failure (HCC) [J96.01] Patient Active Problem List   Diagnosis Date Noted   Acute hypoxic respiratory failure (HCC) 01/10/2024   Constipation 06/24/2023   Thrush 06/23/2023   Acute urinary retention 06/23/2023   Compression fracture of T11 vertebra, sequela 06/21/2023   AKI (acute kidney injury) 06/19/2023   Nephrolithiasis 06/19/2023   Closed compression fracture of body of L1 vertebra (HCC) 06/19/2023   Overweight (BMI 25.0-29.9)  06/19/2023   Compression fracture of T11 vertebra (HCC) 06/19/2023   Chronic kidney disease (CKD), stage 3 (HCC)    Diastolic CHF, chronic (HCC) 06/14/2023   COPD exacerbation (HCC) 06/14/2023   E-coli UTI 11/16/2022   Advanced dementia (HCC) 11/16/2022   Acute metabolic encephalopathy 11/16/2022   Generalized weakness 04/11/2022   Ground-level fall 04/10/2022   COPD with acute exacerbation (HCC) 04/10/2022   Essential hypertension  04/10/2022   Acute encephalopathy 04/10/2022   Hiatal hernia 01/05/2022   Dysuria 09/28/2021   Left wrist pain 07/23/2021   Tendinitis of left wrist 07/23/2021   Closed fracture of distal end of left radius 07/23/2021   Tenosynovitis of left wrist 07/23/2021   Bilateral carotid artery stenosis 05/27/2020   Staring episodes 12/19/2019   B12 deficiency 12/19/2019   Vitamin D  deficiency 12/19/2019   Frequent UTI 12/19/2019   Syncope 12/16/2019   Total knee replacement status 12/23/2018   Obstruction of left ureteropelvic junction (UPJ) 02/26/2018   Severe sepsis (HCC) 02/10/2018   Closed burst fracture of lumbar vertebra with routine healing 04/01/2017   Chronic bronchitis (HCC) 02/18/2017   Age-related osteoporosis with current pathological fracture with routine healing 02/18/2017   Osteoporosis 11/20/2015   COPD (chronic obstructive pulmonary disease) (HCC) 11/20/2015   Cyst of ovary 07/26/2015   Status post total left knee replacement 04/03/2015   Goals of care, counseling/discussion 10/12/2014   Abnormal chest sounds 08/25/2014   Allergic rhinitis 08/25/2014   Colon polyp 08/25/2014   DDD (degenerative disc disease), cervical 08/25/2014   DDD (degenerative disc disease), lumbar 08/25/2014   H/O non-Hodgkin's lymphoma 08/25/2014   Hyponatremia 08/25/2014   Primary osteoarthritis of right knee 08/25/2014   Subclinical hypothyroidism 08/25/2014   Adnexal mass 06/03/2014   Osteoarthritis 03/19/2011   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   History of smoking 03/11/2008   PCP:  Shelley Loring, Pllc Pharmacy:   [facility] - [place], [place] - [address] [place] [address] Phone: [phone] Fax: [phone]     Social Drivers of Health (SDOH) Social History: SDOH Screenings   Food Insecurity: No Food Insecurity (01/10/2024)  Housing: Low Risk (01/10/2024)  Transportation Needs: No Transportation Needs  (01/10/2024)  Utilities: Not At Risk (01/10/2024)  Alcohol Screen: Low Risk (08/19/2022)  Depression (PHQ2-9): Low Risk (08/19/2022)  Financial Resource Strain: Low Risk (08/19/2022)  Physical Activity: Insufficiently Active (08/19/2022)  Social Connections: Socially Isolated (01/10/2024)  Stress: No Stress Concern Present (08/19/2022)  Tobacco Use: Medium Risk (01/10/2024)  Health Literacy: Inadequate Health Literacy (08/19/2022)   SDOH Interventions:     Readmission Risk Interventions     No data to display

## 2024-01-13 NOTE — Plan of Care (Signed)
 Consult to PMT noted for GOC. Upon chart review, patient is discharging back to [facility] today. Hospice care has been discussed with family by [facility] provider, and will be discussed further by [facility] providers/staff upon her return.  PMT will sign off as GOC have been established and patient is discharging today.

## 2024-01-13 NOTE — Discharge Summary (Signed)
 Physician Discharge Summary   Patient: Monica Singh MRN: [id number] DOB: 06-17-1928  Admit date:     01/10/2024  Discharge date: 01/13/2024  Discharge Physician: Cresencio Fairly   PCP: [facility]   Recommendations at discharge:    Hospice at [facility]  Discharge Diagnoses: Principal Problem:   Acute hypoxic respiratory failure (HCC) Active Problems:   Diastolic CHF, chronic (HCC)   COPD (chronic obstructive pulmonary disease) (HCC)   Frequent UTI   Chronic kidney disease (CKD), stage 3 (HCC)   Essential hypertension   H/O non-Hodgkin's lymphoma   DDD (degenerative disc disease), cervical   DDD (degenerative disc disease), lumbar   Goals of care, counseling/discussion   Severe sepsis (HCC)   Obstruction of left ureteropelvic junction (UPJ)   Advanced dementia (HCC)   Constipation  Hospital Course: Assessment and Plan:  89 y.o. year old female with medical history of HTN, COPD, CHF (EF 60 % in 05/2023 with G1DD), DDD, advanced dementia coming to the ED with from memory care units for shortness of breath and coughing.    12/14: Palliative care c/s 12/15: Discontinue telemetry transfer to any MedSurg.  Added hyoscyamine  for oral secretions.  Family would like hospice [facility]   PNA Pt presented with AHRF 2/2 to pneumonia. Image findings consistent with pneumonia. Blood cultures neg.  -treated with Ceftriaxone  and Azithromycin   -doxy for 3 more days to complete Abx course   History of recurrent UTI: continue methenamine . UA worrisome for UTI, urine c/s shows multiple species Obstruction of left ureteropelvic junction:  Pt with left UPJ obstruction s/p stent placement by urology. They are changing stents on yearly basis. Pt is to see them outpatient for consideration of future stent change.  - hold off CT based on family meeting, they're agreeable   Moderate-Severe Dementia: at baseline    COPD: Patient with shortness of breath but no  cough or sputum production.  Not in COPD exacerbation.    Degenerative disc disease:    Constipation: Likely secondary to opioid: resolved, stopping all stool softners   CKD 3A: At baseline.   HFpEF: Not volume overloaded.  continue home Lasix .   Hx of DVT: on eliquis .        Disposition: Hospice care Diet recommendation:  Carb modified diet DISCHARGE MEDICATION: Allergies as of 01/13/2024       Reactions   Ciprofloxacin  Itching   Whelps on face reported 03/16/2021   Latex Itching   Misc. Sulfonamide Containing Compounds    Sulfa Antibiotics Hives, Itching        Medication List     STOP taking these medications    cefTRIAXone  1 g injection Commonly known as: ROCEPHIN    methenamine  1 g tablet Commonly known as: HIPREX    nitrofurantoin  (macrocrystal-monohydrate) 100 MG capsule Commonly known as: MACROBID    oxyCODONE  5 MG immediate release tablet Commonly known as: Oxy IR/ROXICODONE    polyethylene glycol 17 g packet Commonly known as: MIRALAX  / GLYCOLAX    predniSONE  10 MG tablet Commonly known as: DELTASONE    psyllium 0.52 g capsule Commonly known as: REGULOID   senna-docusate 8.6-50 MG tablet Commonly known as: Senokot-S   sodium phosphate  Enem   traMADol  50 MG tablet Commonly known as: Ultram        TAKE these medications    acetaminophen  325 MG tablet Commonly known as: TYLENOL  Take 2 tablets (650 mg total) by mouth every 6 (six) hours. What changed: when to take this   AeroChamber MV inhaler Use as instructed  albuterol  108 (90 Base) MCG/ACT inhaler Commonly known as: VENTOLIN  HFA Inhale 2 puffs into the lungs every 6 (six) hours as needed for wheezing or shortness of breath.   benzonatate  100 MG capsule Commonly known as: TESSALON  Take 1 capsule (100 mg total) by mouth 2 (two) times daily as needed for cough.   Breztri  Aerosphere 160-9-4.8 MCG/ACT Aero inhaler Generic drug: budesonide -glycopyrrolate -formoterol  Inhale 2 puffs  into the lungs in the morning and at bedtime.   cholecalciferol  25 MCG (1000 UNIT) tablet Commonly known as: VITAMIN D3 Take 1,000 Units by mouth daily.   doxycycline  100 MG tablet Commonly known as: VIBRA -TABS Take 1 tablet (100 mg total) by mouth 2 (two) times daily for 3 days.   Eliquis  5 MG Tabs tablet Generic drug: apixaban  Take 5 mg by mouth 2 (two) times daily.   ferrous sulfate  325 (65 FE) MG tablet Take 325 mg by mouth daily with breakfast.   furosemide  20 MG tablet Commonly known as: Lasix  Take 1 tablet (20 mg total) by mouth daily.   guaiFENesin  600 MG 12 hr tablet Commonly known as: MUCINEX  Take 2 tablets (1,200 mg total) by mouth 2 (two) times daily as needed for to loosen phlegm or cough.   hydrocortisone  cream 1 % Apply 1 Application topically 2 (two) times daily.   hyoscyamine  0.375 MG 12 hr tablet Commonly known as: LEVBID  Take 1 tablet (0.375 mg total) by mouth every 12 (twelve) hours.   ipratropium 0.02 % nebulizer solution Commonly known as: ATROVENT  Take 0.5 mg by nebulization every 8 (eight) hours as needed for shortness of breath (cough).   methenamine  1 g tablet Commonly known as: MANDELAMINE Take 1,000 mg by mouth 2 (two) times daily. You may have different instructions for this medication elsewhere on this list. Ask your doctor how you should be taking this medication.        Contact information for after-discharge care     Destination     [facility] .   Service: Skilled Nursing Contact information: [address] [place]  [address] [phone]                    Discharge Exam: Fredricka Weights   01/11/24 0500 01/12/24 0500 01/13/24 0446  Weight: 58.8 kg 64.2 kg 63.9 kg   General exam: Appears calm and comfortable  Respiratory system: rhochi at bases b/l a lot of oral secretions and gurgling sounds Cardiovascular system: S1 & S2 heard, RRR.  No murmurs, pedal edema. Gastrointestinal system: Abdomen is soft, benign Central nervous system: Alert and awake, pleasantly confused No focal neurological deficits. Extremities: Symmetric 5 x 5 power. Skin: No rashes, lesions or ulcers  Condition at discharge: poor  The results of significant diagnostics from this hospitalization (including imaging, microbiology, ancillary and laboratory) are listed below for reference.   Imaging Studies: CT Angio Chest PE W and/or Wo Contrast Result Date: 01/10/2024 CLINICAL DATA:  Pulmonary embolism (PE) suspected, high prob EXAM: CT ANGIOGRAPHY CHEST WITH CONTRAST TECHNIQUE: Multidetector CT imaging of the chest was performed using the standard protocol during bolus administration of intravenous contrast. Multiplanar CT image reconstructions and MIPs were obtained to evaluate the vascular anatomy. RADIATION DOSE REDUCTION: This exam was performed according to the departmental dose-optimization program which includes automated exposure control, adjustment of the mA and/or kV according to patient size and/or use of iterative reconstruction technique. CONTRAST:  75mL OMNIPAQUE  IOHEXOL  350 MG/ML SOLN COMPARISON:  Jun 18, 2023, November 16, 2022, October 08, 2010, it July 01, 2022, cervical spine CT dated April 10, 2022 FINDINGS: Cardiovascular: Heart is mildly enlarged. No pericardial effusion. Three-vessel coronary artery atherosclerotic calcifications. Atherosclerotic calcifications of the nonaneurysmal thoracic aorta. No pulmonary embolism through the proximal subsegmental pulmonary arteries. Mediastinum/Nodes: Tortuous RIGHT subclavian artery. Visualized thyroid  is unremarkable. No axillary adenopathy. Mildly prominent RIGHT hilar lymph nodes measuring 10 mm in the short axis, likely reactive (series 6, image 207). Upper limits of normal LEFT subclavian lymph node measuring 7 mm in the short axis (series 6, image 27). This appears similar compared to the priors.  Lungs/Pleura: No pleural effusion or pneumothorax. Moderate centrilobular emphysema. Bronchial wall thickening with scattered areas bronchiectasis. Patchy peripheral ground-glass opacities of the RIGHT upper lobe. Bandlike opacities of the LEFT upper and lower Lobe, likely atelectasis. RIGHT lower lobe pulmonary nodule measures 4 x 2 mm (series 6, image 281). LEFT lower lobe 3 mm pulmonary nodule, stable dating back to 2012 and consistent with a benign etiology (series 6, image 272). Upper Abdomen: Partial visualization of dilation of the LEFT renal collecting system with fat stranding along the LEFT kidney. Overall extent of LEFT-sided hydronephrosis is favored increased since most recent prior in May 2025, but this is at the edge of the field of view. Large hiatal hernia. Musculoskeletal: Revisualization of several compression fracture deformities of the lower thoracic spine status post vertebroplasty. Mild to moderate compression fracture deformity of T10, favored similar compared to both prior radiographs. RIGHT shoulder os acromiale. Review of the MIP images confirms the above findings. IMPRESSION: 1. No pulmonary embolism through the proximal subsegmental pulmonary arteries. 2. Patchy peripheral ground-glass opacities of the RIGHT upper lobe, likely infectious or inflammatory in etiology. Consider follow-up CT in 3 months to assess for resolution if clinically appropriate. 3. Partial visualization of dilation of the LEFT renal collecting system with fat stranding along the LEFT kidney. Overall extent of LEFT-sided hydronephrosis is favored increased since most recent prior in May 2025, but this is at the edge of the field of view. Recommend correlation with urinalysis and dedicated CT abdomen pelvis if clinically indicated. Aortic Atherosclerosis (ICD10-I70.0) and Emphysema (ICD10-J43.9). Electronically Signed   By: Corean Salter M.D.   On: 01/10/2024 12:49   DG Chest Port 1 View Result Date:  01/10/2024 CLINICAL DATA:  Shortness of breath. EXAM: PORTABLE CHEST - 1 VIEW COMPARISON:  07/20/2023 FINDINGS: Cardiomediastinal silhouette and pulmonary vasculature are within normal limits. Mild atelectasis present at the LEFT lung base. Questionable nodular opacity seen in the RIGHT mid lung measuring 1.5 cm. Lungs are otherwise clear. Moderate size hiatal hernia. Vertebral augmentation changes again seen in the lower thoracic and upper lumbar spine. ACDF changes seen in the lower cervical spine. IMPRESSION: 1. Questionable 1.5 cm nodular opacity seen in the RIGHT mid lung. Recommend dedicated two-view chest radiograph for confirmation. 2. Mild LEFT basilar atelectasis. Electronically Signed   By: Aliene Lloyd M.D.   On: 01/10/2024 11:10    Microbiology: Results for orders placed or performed during the hospital encounter of 01/10/24  Blood culture (routine x 2)     Status: None (Preliminary result)   Collection Time: 01/10/24 10:42 AM   Specimen: BLOOD  Result Value Ref Range Status   Specimen Description BLOOD LEFT ANTECUBITAL  Final   Special Requests   Final    BOTTLES DRAWN AEROBIC AND ANAEROBIC Blood Culture results may not be optimal due to an inadequate volume of blood received in culture bottles  Culture   Final    NO GROWTH 3 DAYS Performed at [name], [address] [place], [place] [address]    Report Status PENDING  Incomplete  Resp panel by RT-PCR (RSV, Flu A&B, Covid) Anterior Nasal Swab     Status: None   Collection Time: 01/10/24 10:42 AM   Specimen: Anterior Nasal Swab  Result Value Ref Range Status   SARS Coronavirus 2 by RT PCR NEGATIVE NEGATIVE Final    Comment: (NOTE) SARS-CoV-2 target nucleic acids are NOT DETECTED.  The SARS-CoV-2 RNA is generally detectable in upper respiratory specimens during the acute phase of infection. The lowest concentration of SARS-CoV-2 viral copies this assay can detect is 138 copies/mL. A negative result does not  preclude SARS-Cov-2 infection and should not be used as the sole basis for treatment or other patient management decisions. A negative result may occur with  improper specimen collection/handling, submission of specimen other than nasopharyngeal swab, presence of viral mutation(s) within the areas targeted by this assay, and inadequate number of viral copies(<138 copies/mL). A negative result must be combined with clinical observations, patient history, and epidemiological information. The expected result is Negative.  Fact Sheet for Patients:  bloggercourse.com  Fact Sheet for Healthcare Providers:  seriousbroker.it  This test is no t yet approved or cleared by the [place]  FDA and  has been authorized for detection and/or diagnosis of SARS-CoV-2 by FDA under an Emergency Use Authorization (EUA). This EUA will remain  in effect (meaning this test can be used) for the duration of the COVID-19 declaration under Section 564(b)(1) of the Act, 21 U.S.C.section 360bbb-3(b)(1), unless the authorization is terminated  or revoked sooner.       Influenza A by PCR NEGATIVE NEGATIVE Final   Influenza B by PCR NEGATIVE NEGATIVE Final    Comment: (NOTE) The Xpert Xpress SARS-CoV-2/FLU/RSV plus assay is intended as an aid in the diagnosis of influenza from Nasopharyngeal swab specimens and should not be used as a sole basis for treatment. Nasal washings and aspirates are unacceptable for Xpert Xpress SARS-CoV-2/FLU/RSV testing.  Fact Sheet for Patients: bloggercourse.com  Fact Sheet for Healthcare Providers: seriousbroker.it  This test is not yet approved or cleared by the [place]  FDA and has been authorized for detection and/or diagnosis of SARS-CoV-2 by FDA under an Emergency Use Authorization (EUA). This EUA will remain in effect (meaning this test can be used) for the  duration of the COVID-19 declaration under Section 564(b)(1) of the Act, 21 U.S.C. section 360bbb-3(b)(1), unless the authorization is terminated or revoked.     Resp Syncytial Virus by PCR NEGATIVE NEGATIVE Final    Comment: (NOTE) Fact Sheet for Patients: bloggercourse.com  Fact Sheet for Healthcare Providers: seriousbroker.it  This test is not yet approved or cleared by the [place]  FDA and has been authorized for detection and/or diagnosis of SARS-CoV-2 by FDA under an Emergency Use Authorization (EUA). This EUA will remain in effect (meaning this test can be used) for the duration of the COVID-19 declaration under Section 564(b)(1) of the Act, 21 U.S.C. section 360bbb-3(b)(1), unless the authorization is terminated or revoked.  Performed at [facility], [address] [place], [place] [address]   Blood culture (routine x 2)     Status: None (Preliminary result)   Collection Time: 01/10/24 10:47 AM   Specimen: BLOOD  Result Value Ref Range Status   Specimen Description BLOOD BLOOD LEFT HAND  Final   Special Requests   Final  BOTTLES DRAWN AEROBIC AND ANAEROBIC Blood Culture results may not be optimal due to an inadequate volume of blood received in culture bottles   Culture   Final    NO GROWTH 3 DAYS Performed at [facility], [address] [place], [place] [address]    Report Status PENDING  Incomplete  Urine Culture (for pregnant, neutropenic or urologic patients or patients with an indwelling urinary catheter)     Status: Abnormal   Collection Time: 01/10/24  6:08 PM   Specimen: Urine, Random  Result Value Ref Range Status   Specimen Description   Final    URINE, RANDOM Performed at [facility], [address] [place], [place] [address]    Special Requests   Final    NONE Performed at [facility], [address] [place], [place] [address]    Culture MULTIPLE  SPECIES PRESENT, SUGGEST RECOLLECTION (A)  Final   Report Status 01/12/2024 FINAL  Final    Labs: CBC: Recent Labs  Lab 01/10/24 1041 01/11/24 0351  WBC 7.6 5.7  HGB 10.1* 9.1*  HCT 31.9* 28.8*  MCV 88.9 88.1  PLT 379 371   Basic Metabolic Panel: Recent Labs  Lab 01/10/24 1041 01/10/24 1728 01/11/24 0351  NA 134*  --  136  K 3.8  --  3.9  CL 95*  --  99  CO2 26  --  25  GLUCOSE 133*  --  90  BUN 22  --  17  CREATININE 0.91  --  0.77  CALCIUM  8.6*  --  8.1*  MG  --  2.0  --    Liver Function Tests: Recent Labs  Lab 01/10/24 1041  AST 17  ALT 10  ALKPHOS 79  BILITOT 0.3  PROT 6.6  ALBUMIN 3.6   CBG: Recent Labs  Lab 01/11/24 0816 01/12/24 0827 01/13/24 0808  GLUCAP 109* 103* 119*    Discharge time spent: greater than 30 minutes.  Signed: Cresencio Fairly, MD [facility] 01/13/2024

## 2024-01-13 NOTE — Plan of Care (Signed)

## 2024-01-13 NOTE — Progress Notes (Signed)
 Tried to call and give report to [facility] [phone]) twice without answer

## 2024-01-13 NOTE — Care Management Important Message (Signed)
 Important Message  Patient Details  Name: Monica Singh MRN: [id number] Date of Birth: 24-Sep-1928   Important Message Given:  Yes - Medicare IM     Manila Rommel W, CMA 01/13/2024, 11:46 AM

## 2024-01-15 LAB — CULTURE, BLOOD (ROUTINE X 2)
Culture: NO GROWTH
Culture: NO GROWTH

## 2024-02-28 ENCOUNTER — Emergency Department

## 2024-02-28 ENCOUNTER — Other Ambulatory Visit: Payer: Self-pay

## 2024-02-28 ENCOUNTER — Emergency Department
Admission: EM | Admit: 2024-02-28 | Discharge: 2024-02-29 | Disposition: A | Attending: Emergency Medicine | Admitting: Emergency Medicine

## 2024-02-28 DIAGNOSIS — S0101XA Laceration without foreign body of scalp, initial encounter: Secondary | ICD-10-CM | POA: Insufficient documentation

## 2024-02-28 DIAGNOSIS — I509 Heart failure, unspecified: Secondary | ICD-10-CM | POA: Insufficient documentation

## 2024-02-28 DIAGNOSIS — S81012A Laceration without foreign body, left knee, initial encounter: Secondary | ICD-10-CM | POA: Insufficient documentation

## 2024-02-28 DIAGNOSIS — W19XXXA Unspecified fall, initial encounter: Secondary | ICD-10-CM

## 2024-02-28 DIAGNOSIS — Z7901 Long term (current) use of anticoagulants: Secondary | ICD-10-CM | POA: Insufficient documentation

## 2024-02-28 DIAGNOSIS — N189 Chronic kidney disease, unspecified: Secondary | ICD-10-CM | POA: Insufficient documentation

## 2024-02-28 DIAGNOSIS — M25511 Pain in right shoulder: Secondary | ICD-10-CM | POA: Insufficient documentation

## 2024-02-28 DIAGNOSIS — F0390 Unspecified dementia without behavioral disturbance: Secondary | ICD-10-CM | POA: Insufficient documentation

## 2024-02-28 DIAGNOSIS — S022XXB Fracture of nasal bones, initial encounter for open fracture: Secondary | ICD-10-CM | POA: Insufficient documentation

## 2024-02-28 DIAGNOSIS — I130 Hypertensive heart and chronic kidney disease with heart failure and stage 1 through stage 4 chronic kidney disease, or unspecified chronic kidney disease: Secondary | ICD-10-CM | POA: Insufficient documentation

## 2024-02-28 DIAGNOSIS — N3000 Acute cystitis without hematuria: Secondary | ICD-10-CM

## 2024-02-28 DIAGNOSIS — W050XXA Fall from non-moving wheelchair, initial encounter: Secondary | ICD-10-CM | POA: Insufficient documentation

## 2024-02-28 DIAGNOSIS — I82409 Acute embolism and thrombosis of unspecified deep veins of unspecified lower extremity: Secondary | ICD-10-CM | POA: Insufficient documentation

## 2024-02-28 DIAGNOSIS — J449 Chronic obstructive pulmonary disease, unspecified: Secondary | ICD-10-CM | POA: Insufficient documentation

## 2024-02-28 HISTORY — DX: Atherosclerotic heart disease of native coronary artery without angina pectoris: I25.10

## 2024-02-28 HISTORY — DX: Heart failure, unspecified: I50.9

## 2024-02-28 HISTORY — DX: Tremor, unspecified: R25.1

## 2024-02-28 LAB — COMPREHENSIVE METABOLIC PANEL WITH GFR
ALT: 10 U/L (ref 0–44)
AST: 16 U/L (ref 15–41)
Albumin: 3.9 g/dL (ref 3.5–5.0)
Alkaline Phosphatase: 106 U/L (ref 38–126)
Anion gap: 14 (ref 5–15)
BUN: 22 mg/dL (ref 8–23)
CO2: 23 mmol/L (ref 22–32)
Calcium: 9.3 mg/dL (ref 8.9–10.3)
Chloride: 97 mmol/L — ABNORMAL LOW (ref 98–111)
Creatinine, Ser: 0.9 mg/dL (ref 0.44–1.00)
GFR, Estimated: 60 mL/min — ABNORMAL LOW
Glucose, Bld: 170 mg/dL — ABNORMAL HIGH (ref 70–99)
Potassium: 4.5 mmol/L (ref 3.5–5.1)
Sodium: 135 mmol/L (ref 135–145)
Total Bilirubin: 0.3 mg/dL (ref 0.0–1.2)
Total Protein: 7.8 g/dL (ref 6.5–8.1)

## 2024-02-28 LAB — CBC
HCT: 35.5 % — ABNORMAL LOW (ref 36.0–46.0)
Hemoglobin: 10.9 g/dL — ABNORMAL LOW (ref 12.0–15.0)
MCH: 26.9 pg (ref 26.0–34.0)
MCHC: 30.7 g/dL (ref 30.0–36.0)
MCV: 87.7 fL (ref 80.0–100.0)
Platelets: 512 10*3/uL — ABNORMAL HIGH (ref 150–400)
RBC: 4.05 MIL/uL (ref 3.87–5.11)
RDW: 13.6 % (ref 11.5–15.5)
WBC: 13.9 10*3/uL — ABNORMAL HIGH (ref 4.0–10.5)
nRBC: 0 % (ref 0.0–0.2)

## 2024-02-28 MED ORDER — LIDOCAINE-EPINEPHRINE 2 %-1:100000 IJ SOLN
20.0000 mL | Freq: Once | INTRAMUSCULAR | Status: AC
  Administered 2024-02-28: 20 mL
  Filled 2024-02-28: qty 1

## 2024-02-28 MED ORDER — CEPHALEXIN 500 MG PO CAPS: 500.0000 mg | ORAL_CAPSULE | Freq: Four times a day (QID) | ORAL | 0 refills | Status: DC

## 2024-02-28 NOTE — ED Notes (Signed)
 [facility] called contacted. No trucks available for pick up until tomorrow.

## 2024-02-28 NOTE — ED Triage Notes (Signed)
 Pt to ED from [facility] via [facility] for witnessed fall out of wheelchair onto face and staff at [facility] left pt lying on her face until EMS arrived.   Frontal bones have area of DEPRESSION to lower medial forehead and there is substantial bruising and large lacerations to forehead. Also some swelling to R cheek under eye.  Also has skin tear to LLE.   Hx dementia, at baseline (to self only). Also pt has chronic R arm swelling. No obvious deformities per EMS.   EMS VS: 175/91, 100% on RA, RR 26, HR 116. EKG was normal.   Triage EKG shows ST, 120.  Pt is on Eliquis .

## 2024-02-29 LAB — URINALYSIS, W/ REFLEX TO CULTURE (INFECTION SUSPECTED)
Bilirubin Urine: NEGATIVE
Glucose, UA: NEGATIVE mg/dL
Ketones, ur: NEGATIVE mg/dL
Nitrite: NEGATIVE
Protein, ur: 100 mg/dL — AB
RBC / HPF: >50 RBC/hpf (ref 0–5)
Specific Gravity, Urine: 1.014 (ref 1.005–1.030)
Squamous Epithelial / HPF: 0 /HPF (ref 0–5)
WBC, UA: >50 WBC/hpf (ref 0–5)
pH: 8 (ref 5.0–8.0)

## 2024-02-29 MED ORDER — FERROUS SULFATE 325 (65 FE) MG PO TABS: 325.0000 mg | ORAL_TABLET | Freq: Every day | ORAL | Status: DC

## 2024-02-29 MED ORDER — CEFTRIAXONE SODIUM 1 G IJ SOLR: 2.0000 g | Freq: Once | INTRAMUSCULAR | Status: DC

## 2024-02-29 MED ORDER — APIXABAN 5 MG PO TABS: 5.0000 mg | ORAL_TABLET | Freq: Two times a day (BID) | ORAL | Status: DC

## 2024-02-29 MED ORDER — SODIUM CHLORIDE 0.9 % IV SOLN
1.0000 g | Freq: Once | INTRAVENOUS | Status: AC
  Administered 2024-02-29: 1 g via INTRAVENOUS
  Filled 2024-02-29: qty 10

## 2024-02-29 MED ORDER — CEPHALEXIN 500 MG PO CAPS: 500.0000 mg | ORAL_CAPSULE | Freq: Four times a day (QID) | ORAL | 0 refills | Status: AC

## 2024-02-29 MED ORDER — FUROSEMIDE 40 MG PO TABS: 20.0000 mg | ORAL_TABLET | Freq: Every day | ORAL | Status: DC

## 2024-02-29 NOTE — ED Notes (Signed)
 Pt cleaned and changed into new brief, along with linen change.

## 2024-02-29 NOTE — ED Notes (Signed)
 [facility] will take hallway

## 2024-03-02 LAB — URINE CULTURE: Culture: >=100000 — AB

## 2024-03-03 ENCOUNTER — Ambulatory Visit: Admitting: Urology

## 2024-03-04 ENCOUNTER — Ambulatory Visit: Admitting: Urology
